# Patient Record
Sex: Male | Born: 1950
Health system: Southern US, Community
[De-identification: ages and names within clinical notes are randomized; demographics above are authoritative.]

## PROBLEM LIST (undated history)

## (undated) DIAGNOSIS — F419 Anxiety disorder, unspecified: Secondary | ICD-10-CM

## (undated) DIAGNOSIS — Z9221 Personal history of antineoplastic chemotherapy: Secondary | ICD-10-CM

## (undated) DIAGNOSIS — D4989 Neoplasm of unspecified behavior of other specified sites: Secondary | ICD-10-CM

## (undated) DIAGNOSIS — G709 Myoneural disorder, unspecified: Secondary | ICD-10-CM

## (undated) DIAGNOSIS — Z923 Personal history of irradiation: Secondary | ICD-10-CM

## (undated) DIAGNOSIS — C9000 Multiple myeloma not having achieved remission: Secondary | ICD-10-CM

## (undated) DIAGNOSIS — Z5189 Encounter for other specified aftercare: Secondary | ICD-10-CM

## (undated) DIAGNOSIS — Z9484 Stem cells transplant status: Secondary | ICD-10-CM

## (undated) HISTORY — PX: COLONOSCOPY: SHX174

## (undated) HISTORY — DX: Personal history of irradiation: Z92.3

## (undated) HISTORY — DX: Myoneural disorder, unspecified: G70.9

## (undated) HISTORY — PX: KNEE ARTHROSCOPY: SUR90

## (undated) HISTORY — DX: Anxiety disorder, unspecified: F41.9

## (undated) HISTORY — PX: APPENDECTOMY: SHX54

## (undated) HISTORY — DX: Personal history of antineoplastic chemotherapy: Z92.21

## (undated) MED FILL — Dexamethasone Sodium Phosphate Inj 100 MG/10ML: INTRAMUSCULAR | Qty: 2 | Status: AC

---
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
## 2008-02-01 ENCOUNTER — Ambulatory Visit: Payer: Self-pay | Admitting: Gastroenterology

## 2008-02-18 ENCOUNTER — Ambulatory Visit: Payer: Self-pay | Admitting: Gastroenterology

## 2008-02-18 ENCOUNTER — Encounter: Payer: Self-pay | Admitting: Gastroenterology

## 2008-02-21 ENCOUNTER — Encounter: Payer: Self-pay | Admitting: Gastroenterology

## 2010-03-05 NOTE — Procedures (Signed)
Summary: Colonoscopy   Colonoscopy  Procedure date:  02/18/2008  Findings:      Location:  [facility].    Procedures Next Due Date:    Colonoscopy: 02/2018  COLONOSCOPY PROCEDURE REPORT  PATIENT:  Alexander, Saunders  MR#:  [id number] BIRTHDATE:   08-17-1950   GENDER:   male  ENDOSCOPIST:   Venita Lick. Russella Dar, MD, [facility] Referred by:   PROCEDURE DATE:  02/18/2008 PROCEDURE:  Colonoscopy with snare polypectomy ASA CLASS:   Class I INDICATIONS: 1) screening   MEDICATIONS:    Fentanyl 50 mcg IV, Versed 7 mg IV  DESCRIPTION OF PROCEDURE:   After the risks benefits and alternatives of the procedure were thoroughly explained, informed consent was obtained.  Digital rectal exam was performed and revealed no abnormalities.   The LB [id number] endoscope was introduced through the anus and advanced to the cecum, which was identified by both the appendix and ileocecal valve, without limitations.  The quality of the prep was good, using MoviPrep.  The instrument was then slowly withdrawn as the colon was fully examined. <<PROCEDUREIMAGES>>          <<OLD IMAGES>>  FINDINGS:  A sessile polyp was found in the sigmoid colon. It was 3 mm in size. Polyp was snared without cautery. Retrieval was successful. This was otherwise a normal examination.   Retroflexed views in the rectum revealed internal hemorrhoids, small.  The time to cecum =  5.75  minutes. The scope was then withdrawn (time =  10.5  min) from the patient and the procedure completed.  COMPLICATIONS:   None  ENDOSCOPIC IMPRESSION:  1) 3 mm sessile polyp in the sigmoid colon  2) Internal hemorrhoids RECOMMENDATIONS:  1) await pathology results  2) If the polyp(s) removed today are proven to be adenomatous (pre-cancerous) polyps, the patient will need a colonoscopy in 5 years. Otherwise they should continue to follow colorectal cancer screening guidelines for "routine risk" patients with colonoscopy in 10  years.   Venita Lick. Russella Dar, MD, [facility]    CC: Catha Gosselin MD     REPORT OF SURGICAL PATHOLOGY   Case #: [id number] Patient Name: Alexander Saunders, Alexander Saunders. Office Chart Number:  [id number]   MRN: [id number] Pathologist: Beulah Gandy. Luisa Hart, MD DOB/Age  02-26-1950 (Age: 60)    Gender: M Date Taken:  02/18/2008 Date Received: 02/18/2008   FINAL DIAGNOSIS   ***MICROSCOPIC EXAMINATION AND DIAGNOSIS***   COLON, SIGMOID POLYP:  POLYPOID COLONIC MUCOSA WITH MELANOSIS COLI.  NO ADENOMATOUS CHANGE OR EVIDENCE OF MALIGNANCY.     kv Date Reported:  02/21/2008     Beulah Gandy. Luisa Hart, MD *** Electronically Signed Out By JDP ***      February 21, 2008 MRN: [id number]    Alexander Saunders [address] [place], [place]  [address]    Dear Mr. Alexander Saunders,  I am pleased to inform you that the biopsies taken during your recent colonoscopy did not show any evidence of cancer upon pathologic examination. The biopsies did not show polyp tissue.   Continue with the treatment plan as outlined on the day of your      exam.  You should have a repeat colonoscopy examination for routine colorectal cancer screening in 10 years.  Please call us if you are having persistent problems or have questions about your condition that have not been fully answered at this time.  Sincerely,  Meryl Dare MD [facility]  This letter has been electronically signed by your physician.   This  report was created from the original endoscopy report, which was reviewed and signed by the above listed endoscopist.

## 2010-03-05 NOTE — Letter (Signed)
Summary: Patient Notice- Colon Biospy Results  [facility]  [address] [place], [place] [address]   Phone: [phone]  Fax: [phone]        February 21, 2008 MRN: [id number]    Alexander Saunders [address] [place], [place]  [address]    Dear [name],  I am pleased to inform you that the biopsies taken during your recent colonoscopy did not show any evidence of cancer upon pathologic examination. The biopsies did not show polyp tissue.   Continue with the treatment plan as outlined on the day of your      exam.  You should have a repeat colonoscopy examination for routine colorectal cancer screening in 10 years.  Please call us if you are having persistent problems or have questions about your condition that have not been fully answered at this time.  Sincerely,  Meryl Dare MD [facility]  This letter has been electronically signed by your physician.

## 2010-03-05 NOTE — Miscellaneous (Signed)
Summary: Previsit  Clinical Lists Changes  Medications: Added new medication of MOVIPREP 100 GM  SOLR (PEG-KCL-NACL-NASULF-NA ASC-C) As per prep instructions. - Signed Rx of MOVIPREP 100 GM  SOLR (PEG-KCL-NACL-NASULF-NA ASC-C) As per prep instructions.;  #1 x 0;  Signed;  Entered by: Jennye Boroughs RN;  Authorized by: Meryl Dare MD [facility]  Method used: Electronically to [facility] #[id number]*, [address], [place], [place]  [address], Ph: [phone], Fax: [phone] Observations: Added new observation of NKA: T (02/01/2008 16:18)    Prescriptions: MOVIPREP 100 GM  SOLR (PEG-KCL-NACL-NASULF-NA ASC-C) As per prep instructions.  #1 x 0   Entered by:   Jennye Boroughs RN   Authorized by:   Meryl Dare MD [facility]   Signed by:   Jennye Boroughs RN on 02/01/2008   Method used:   Electronically to        [id number][facility] (retail)       [address] [place], [place]  [address]       Ph: [phone]       Fax: [phone]   RxID:   [id number]

## 2014-06-02 ENCOUNTER — Encounter: Payer: Self-pay | Admitting: Gastroenterology

## 2016-02-04 DIAGNOSIS — Z5189 Encounter for other specified aftercare: Secondary | ICD-10-CM

## 2016-02-04 DIAGNOSIS — C9000 Multiple myeloma not having achieved remission: Secondary | ICD-10-CM

## 2016-02-04 HISTORY — DX: Encounter for other specified aftercare: Z51.89

## 2016-02-04 HISTORY — DX: Multiple myeloma not having achieved remission: C90.00

## 2016-05-22 DIAGNOSIS — Z23 Encounter for immunization: Secondary | ICD-10-CM | POA: Diagnosis not present

## 2016-05-29 DIAGNOSIS — M545 Low back pain: Secondary | ICD-10-CM | POA: Diagnosis not present

## 2016-05-29 DIAGNOSIS — R079 Chest pain, unspecified: Secondary | ICD-10-CM | POA: Diagnosis not present

## 2016-05-29 DIAGNOSIS — R5382 Chronic fatigue, unspecified: Secondary | ICD-10-CM | POA: Diagnosis not present

## 2016-05-29 DIAGNOSIS — N529 Male erectile dysfunction, unspecified: Secondary | ICD-10-CM | POA: Diagnosis not present

## 2016-05-29 DIAGNOSIS — D649 Anemia, unspecified: Secondary | ICD-10-CM | POA: Diagnosis not present

## 2016-07-31 DIAGNOSIS — D638 Anemia in other chronic diseases classified elsewhere: Secondary | ICD-10-CM | POA: Diagnosis not present

## 2016-07-31 DIAGNOSIS — N529 Male erectile dysfunction, unspecified: Secondary | ICD-10-CM | POA: Diagnosis not present

## 2016-07-31 DIAGNOSIS — M545 Low back pain: Secondary | ICD-10-CM | POA: Diagnosis not present

## 2016-08-26 DIAGNOSIS — D638 Anemia in other chronic diseases classified elsewhere: Secondary | ICD-10-CM | POA: Diagnosis not present

## 2016-08-26 DIAGNOSIS — N529 Male erectile dysfunction, unspecified: Secondary | ICD-10-CM | POA: Diagnosis not present

## 2016-08-26 DIAGNOSIS — R0789 Other chest pain: Secondary | ICD-10-CM | POA: Diagnosis not present

## 2016-08-27 ENCOUNTER — Other Ambulatory Visit: Payer: Self-pay | Admitting: Internal Medicine

## 2016-08-27 DIAGNOSIS — R0789 Other chest pain: Secondary | ICD-10-CM

## 2016-09-02 ENCOUNTER — Observation Stay (HOSPITAL_COMMUNITY): Payer: Medicare Other

## 2016-09-02 ENCOUNTER — Other Ambulatory Visit: Payer: Self-pay

## 2016-09-02 ENCOUNTER — Emergency Department (HOSPITAL_COMMUNITY): Payer: Medicare Other

## 2016-09-02 ENCOUNTER — Inpatient Hospital Stay (HOSPITAL_COMMUNITY)
Admission: EM | Admit: 2016-09-02 | Discharge: 2016-09-06 | DRG: 029 | Disposition: A | Discharge status: Home Health | Payer: Medicare Other | Attending: Internal Medicine | Admitting: Internal Medicine

## 2016-09-02 ENCOUNTER — Encounter (HOSPITAL_COMMUNITY): Payer: Self-pay | Admitting: Emergency Medicine

## 2016-09-02 DIAGNOSIS — G9529 Other cord compression: Secondary | ICD-10-CM | POA: Diagnosis not present

## 2016-09-02 DIAGNOSIS — N179 Acute kidney failure, unspecified: Secondary | ICD-10-CM | POA: Diagnosis not present

## 2016-09-02 DIAGNOSIS — W19XXXA Unspecified fall, initial encounter: Secondary | ICD-10-CM | POA: Diagnosis present

## 2016-09-02 DIAGNOSIS — C799 Secondary malignant neoplasm of unspecified site: Secondary | ICD-10-CM

## 2016-09-02 DIAGNOSIS — M6281 Muscle weakness (generalized): Secondary | ICD-10-CM | POA: Diagnosis not present

## 2016-09-02 DIAGNOSIS — D492 Neoplasm of unspecified behavior of bone, soft tissue, and skin: Secondary | ICD-10-CM

## 2016-09-02 DIAGNOSIS — R269 Unspecified abnormalities of gait and mobility: Secondary | ICD-10-CM | POA: Diagnosis not present

## 2016-09-02 DIAGNOSIS — R29898 Other symptoms and signs involving the musculoskeletal system: Secondary | ICD-10-CM

## 2016-09-02 DIAGNOSIS — R29818 Other symptoms and signs involving the nervous system: Secondary | ICD-10-CM | POA: Diagnosis not present

## 2016-09-02 DIAGNOSIS — R531 Weakness: Secondary | ICD-10-CM | POA: Diagnosis not present

## 2016-09-02 DIAGNOSIS — G9589 Other specified diseases of spinal cord: Secondary | ICD-10-CM | POA: Diagnosis not present

## 2016-09-02 DIAGNOSIS — M5126 Other intervertebral disc displacement, lumbar region: Secondary | ICD-10-CM | POA: Diagnosis not present

## 2016-09-02 DIAGNOSIS — G9520 Unspecified cord compression: Secondary | ICD-10-CM | POA: Diagnosis not present

## 2016-09-02 DIAGNOSIS — G9619 Other disorders of meninges, not elsewhere classified: Secondary | ICD-10-CM | POA: Diagnosis not present

## 2016-09-02 DIAGNOSIS — D72829 Elevated white blood cell count, unspecified: Secondary | ICD-10-CM | POA: Diagnosis not present

## 2016-09-02 DIAGNOSIS — Z8249 Family history of ischemic heart disease and other diseases of the circulatory system: Secondary | ICD-10-CM

## 2016-09-02 DIAGNOSIS — R634 Abnormal weight loss: Secondary | ICD-10-CM | POA: Diagnosis not present

## 2016-09-02 DIAGNOSIS — D62 Acute posthemorrhagic anemia: Secondary | ICD-10-CM | POA: Diagnosis not present

## 2016-09-02 DIAGNOSIS — D638 Anemia in other chronic diseases classified elsewhere: Secondary | ICD-10-CM | POA: Diagnosis not present

## 2016-09-02 DIAGNOSIS — M899 Disorder of bone, unspecified: Secondary | ICD-10-CM | POA: Diagnosis not present

## 2016-09-02 DIAGNOSIS — R339 Retention of urine, unspecified: Secondary | ICD-10-CM | POA: Diagnosis not present

## 2016-09-02 DIAGNOSIS — G988 Other disorders of nervous system: Secondary | ICD-10-CM | POA: Diagnosis not present

## 2016-09-02 DIAGNOSIS — N529 Male erectile dysfunction, unspecified: Secondary | ICD-10-CM | POA: Diagnosis not present

## 2016-09-02 DIAGNOSIS — G959 Disease of spinal cord, unspecified: Secondary | ICD-10-CM | POA: Diagnosis present

## 2016-09-02 DIAGNOSIS — K5909 Other constipation: Secondary | ICD-10-CM | POA: Diagnosis present

## 2016-09-02 DIAGNOSIS — G96198 Other disorders of meninges, not elsewhere classified: Secondary | ICD-10-CM | POA: Diagnosis present

## 2016-09-02 LAB — COMPREHENSIVE METABOLIC PANEL
ALT: 18 U/L (ref 17–63)
AST: 24 U/L (ref 15–41)
Albumin: 4.6 g/dL (ref 3.5–5.0)
Alkaline Phosphatase: 30 U/L — ABNORMAL LOW (ref 38–126)
Anion gap: 9 (ref 5–15)
BUN: 26 mg/dL — ABNORMAL HIGH (ref 6–20)
CO2: 24 mmol/L (ref 22–32)
Calcium: 10.7 mg/dL — ABNORMAL HIGH (ref 8.9–10.3)
Chloride: 107 mmol/L (ref 101–111)
Creatinine, Ser: 1.6 mg/dL — ABNORMAL HIGH (ref 0.61–1.24)
GFR calc Af Amer: 50 mL/min — ABNORMAL LOW (ref >60)
GFR calc non Af Amer: 43 mL/min — ABNORMAL LOW (ref >60)
Glucose, Bld: 101 mg/dL — ABNORMAL HIGH (ref 65–99)
Potassium: 4 mmol/L (ref 3.5–5.1)
Sodium: 140 mmol/L (ref 135–145)
Total Bilirubin: 0.8 mg/dL (ref 0.3–1.2)
Total Protein: 7.5 g/dL (ref 6.5–8.1)

## 2016-09-02 LAB — URINALYSIS, ROUTINE W REFLEX MICROSCOPIC
Bacteria, UA: NONE SEEN
Bilirubin Urine: NEGATIVE
Glucose, UA: NEGATIVE mg/dL
Ketones, ur: NEGATIVE mg/dL
Leukocytes, UA: NEGATIVE
Nitrite: NEGATIVE
Protein, ur: NEGATIVE mg/dL
Specific Gravity, Urine: 1.012 (ref 1.005–1.030)
Squamous Epithelial / LPF: NONE SEEN
pH: 5 (ref 5.0–8.0)

## 2016-09-02 LAB — CBC WITH DIFFERENTIAL/PLATELET
Basophils Absolute: 0 10*3/uL (ref 0.0–0.1)
Basophils Relative: 0 %
Eosinophils Absolute: 0.3 10*3/uL (ref 0.0–0.7)
Eosinophils Relative: 3 %
HCT: 31.7 % — ABNORMAL LOW (ref 39.0–52.0)
Hemoglobin: 11 g/dL — ABNORMAL LOW (ref 13.0–17.0)
Lymphocytes Relative: 19 %
Lymphs Abs: 2 10*3/uL (ref 0.7–4.0)
MCH: 31.2 pg (ref 26.0–34.0)
MCHC: 34.7 g/dL (ref 30.0–36.0)
MCV: 89.8 fL (ref 78.0–100.0)
Monocytes Absolute: 1.2 10*3/uL — ABNORMAL HIGH (ref 0.1–1.0)
Monocytes Relative: 11 %
Neutro Abs: 7.1 10*3/uL (ref 1.7–7.7)
Neutrophils Relative %: 67 %
Platelets: 321 10*3/uL (ref 150–400)
RBC: 3.53 MIL/uL — ABNORMAL LOW (ref 4.22–5.81)
RDW: 13.7 % (ref 11.5–15.5)
WBC: 10.6 10*3/uL — ABNORMAL HIGH (ref 4.0–10.5)

## 2016-09-02 MED ORDER — LORATADINE 10 MG PO TABS: 10.0000 mg | ORAL_TABLET | Freq: Every day | ORAL | Status: DC | PRN

## 2016-09-02 MED ORDER — ONDANSETRON HCL 4 MG/2ML IJ SOLN: 4.0000 mg | Freq: Four times a day (QID) | INTRAMUSCULAR | Status: DC | PRN

## 2016-09-02 MED ORDER — DIAZEPAM 5 MG PO TABS
5.0000 mg | ORAL_TABLET | Freq: Once | ORAL | Status: AC
  Administered 2016-09-02: 5 mg via ORAL
  Filled 2016-09-02: qty 1

## 2016-09-02 MED ORDER — ENOXAPARIN SODIUM 40 MG/0.4ML ~~LOC~~ SOLN
40.0000 mg | SUBCUTANEOUS | Status: DC
  Administered 2016-09-03: 40 mg via SUBCUTANEOUS
  Filled 2016-09-02: qty 0.4

## 2016-09-02 MED ORDER — SODIUM CHLORIDE 0.9% FLUSH
3.0000 mL | Freq: Two times a day (BID) | INTRAVENOUS | Status: DC
  Administered 2016-09-03 – 2016-09-05 (×3): 3 mL via INTRAVENOUS

## 2016-09-02 MED ORDER — DEXAMETHASONE SODIUM PHOSPHATE 4 MG/ML IJ SOLN
4.0000 mg | Freq: Four times a day (QID) | INTRAMUSCULAR | Status: DC
  Administered 2016-09-03 – 2016-09-06 (×14): 4 mg via INTRAVENOUS
  Filled 2016-09-02 (×17): qty 1

## 2016-09-02 MED ORDER — ONDANSETRON HCL 4 MG PO TABS: 4.0000 mg | ORAL_TABLET | Freq: Four times a day (QID) | ORAL | Status: DC | PRN

## 2016-09-02 MED ORDER — GADOBENATE DIMEGLUMINE 529 MG/ML IV SOLN
18.0000 mL | Freq: Once | INTRAVENOUS | Status: AC | PRN
  Administered 2016-09-02: 18 mL via INTRAVENOUS

## 2016-09-02 MED ORDER — DEXAMETHASONE SODIUM PHOSPHATE 10 MG/ML IJ SOLN
10.0000 mg | Freq: Once | INTRAMUSCULAR | Status: AC
  Administered 2016-09-02: 10 mg via INTRAVENOUS
  Filled 2016-09-02: qty 1

## 2016-09-02 MED ORDER — ACETAMINOPHEN 325 MG PO TABS: 650.0000 mg | ORAL_TABLET | Freq: Four times a day (QID) | ORAL | Status: DC | PRN

## 2016-09-02 MED ORDER — ALBUTEROL SULFATE (2.5 MG/3ML) 0.083% IN NEBU: 2.5000 mg | INHALATION_SOLUTION | RESPIRATORY_TRACT | Status: DC | PRN

## 2016-09-02 MED ORDER — LORAZEPAM 2 MG/ML IJ SOLN
1.0000 mg | Freq: Once | INTRAMUSCULAR | Status: AC
  Administered 2016-09-02: 1 mg via INTRAVENOUS
  Filled 2016-09-02: qty 1

## 2016-09-02 MED ORDER — HYDROCODONE-ACETAMINOPHEN 5-325 MG PO TABS
1.0000 | ORAL_TABLET | ORAL | Status: DC | PRN
  Administered 2016-09-02 – 2016-09-05 (×6): 1 via ORAL
  Filled 2016-09-02 (×6): qty 1

## 2016-09-02 MED ORDER — ACETAMINOPHEN 650 MG RE SUPP: 650.0000 mg | Freq: Four times a day (QID) | RECTAL | Status: DC | PRN

## 2016-09-02 MED ORDER — SODIUM CHLORIDE 0.9 % IV SOLN
INTRAVENOUS | Status: DC
  Administered 2016-09-02 – 2016-09-03 (×3): via INTRAVENOUS

## 2016-09-02 NOTE — ED Notes (Signed)
Pt bladder scanned post void, 729ml's noted in bladder

## 2016-09-02 NOTE — ED Notes (Signed)
ED Provider at bedside. 

## 2016-09-02 NOTE — H&P (Signed)
History and Physical    Alexander Saunders VQQ:[id number] DOB: 1950/10/09 DOA: 09/02/2016  Referring MD/NP/PA: Dr. Rogene Houston PCP: Benito Mccreedy, MD  Patient coming from: home  Chief Complaint: Leg weakness  HPI: Alexander Saunders is a 66 y.o. male without significant past medical history; presents with complaints of lower extremity weakness. For the last 8 weeks patient has complained of pain on his left mid-lower back that would radiate to the side of his chest /pectoral muscle. Pain continued intermittently become sharp as he notes with coughing. He is also reported progressively worsening weakness on the right leg worse than the left and reports numbness and tingling sensation. The patient is very active at baseline and works out on a daily basis. He also reports weight loss of approximately 8 pounds. He has been taking ibuprofen 800 mg since the onset of symptoms with only mild relief. It seems he initially attributed pain to muscle strain.His wife reports that he went hiking a few weeks ago but he does report being bitten by any ticks. However, last night patient had gotten up to use the bathroom and had fallen due to the right leg weakness. Since that time he also reports that he has not been able to urinate and has had some constipation. Denies any headache, fever, shortness of breath, nausea, vomiting, diarrhea, incontinence, or tobacco abuse. He occasionally drinks, but not on a regular basis.  ED Course: Upon admission into the emergency department patient was found to be afebrile with vital signs relatively within normal limits. Labs revealed WBC 10.6, hemoglobin 11, BUN 26, creatinine 1.6, and calcium 10.7. Foley catheter placed for urinary retention with at least 439m of urine out. Neurosurgery was consulted and saw the patient  Review of Systems: Review of Systems  Constitutional: Positive for weight loss. Negative for chills and fever.  HENT: Positive for hearing loss. Negative for  ear pain.   Eyes: Negative for double vision and photophobia.  Respiratory: Negative for cough, sputum production and shortness of breath.   Cardiovascular: Negative for palpitations and claudication.  Gastrointestinal: Positive for constipation.  Genitourinary: Negative for frequency.       Positive for Urinary hesitancy  Musculoskeletal: Positive for back pain and falls.  Skin: Negative for itching and rash.  Neurological: Positive for sensory change and focal weakness.  Endo/Heme/Allergies: Positive for environmental allergies. Negative for polydipsia.  Psychiatric/Behavioral: Negative for substance abuse. The patient has insomnia.     History reviewed. No pertinent past medical history.  Past Surgical History:  Procedure Laterality Date  . APPENDECTOMY    . KNEE ARTHROSCOPY     r/knee     reports that he has never smoked. He has never used smokeless tobacco. He reports that he drinks alcohol. His drug history is not on file.  No Known Allergies  Family History  Problem Relation Age of Onset  . Hypertension Mother   . Cancer Mother   . Hypertension Father     Prior to Admission medications   Medication Sig Start Date End Date Taking? Authorizing Provider  loratadine (CLARITIN) 10 MG tablet Take 10 mg by mouth daily as needed for allergies.   Yes [provider]  ibuprofen (ADVIL,MOTRIN) 600 MG tablet Take 600 mg by mouth every 6 (six) hours as needed for pain. 08/26/16   [provider]    Physical Exam:  Constitutional: NAD, calm, comfortable Vitals:   09/02/16 1033 09/02/16 1259 09/02/16 1530 09/02/16 1534  BP:  123/73 124/72 124/72  Pulse:  75 72 72  Resp:  '16 16 16  '$ Temp:    97.6 F (36.4 C)  TempSrc:      SpO2:  99% 99% 99%  Weight: 87.1 kg (192 lb)      Eyes: PERRL, lids and conjunctivae normal ENMT: Mucous membranes are moist. Posterior pharynx clear of any exudate or lesions.Normal dentition.  Neck: normal, supple, no masses, no  thyromegaly Respiratory: clear to auscultation bilaterally, no wheezing, no crackles. Normal respiratory effort. No accessory muscle use.  Cardiovascular: Regular rate and rhythm, no murmurs / rubs / gallops. No extremity edema. 2+ pedal pulses. No carotid bruits.  Abdomen: no tenderness, no masses palpated. No hepatosplenomegaly. Bowel sounds positive.  Musculoskeletal: no clubbing / cyanosis. No joint deformity upper and lower extremities. Good ROM, no contractures. Normal muscle tone.  Skin: no rashes, lesions, ulcers. No induration Neurologic: CN 2-12 grossly intact. Sensation intact, DTR normal. Right lower extremity strength 3/5 and left lower extremity 4/5. Psychiatric: Normal judgment and insight. Alert and oriented x 3. Normal mood.     Labs on Admission: I have personally reviewed following labs and imaging studies  CBC:  Recent Labs Lab 09/02/16 1208  WBC 10.6*  NEUTROABS 7.1  HGB 11.0*  HCT 31.7*  MCV 89.8  PLT 182   Basic Metabolic Panel:  Recent Labs Lab 09/02/16 1208  NA 140  K 4.0  CL 107  CO2 24  GLUCOSE 101*  BUN 26*  CREATININE 1.60*  CALCIUM 10.7*   GFR: CrCl cannot be calculated (Unknown ideal weight.). Liver Function Tests:  Recent Labs Lab 09/02/16 1208  AST 24  ALT 18  ALKPHOS 30*  BILITOT 0.8  PROT 7.5  ALBUMIN 4.6   No results for input(s): LIPASE, AMYLASE in the last 168 hours. No results for input(s): AMMONIA in the last 168 hours. Coagulation Profile: No results for input(s): INR, PROTIME in the last 168 hours. Cardiac Enzymes: No results for input(s): CKTOTAL, CKMB, CKMBINDEX, TROPONINI in the last 168 hours. BNP (last 3 results) No results for input(s): PROBNP in the last 8760 hours. HbA1C: No results for input(s): HGBA1C in the last 72 hours. CBG: No results for input(s): GLUCAP in the last 168 hours. Lipid Profile: No results for input(s): CHOL, HDL, LDLCALC, TRIG, CHOLHDL, LDLDIRECT in the last 72 hours. Thyroid  Function Tests: No results for input(s): TSH, T4TOTAL, FREET4, T3FREE, THYROIDAB in the last 72 hours. Anemia Panel: No results for input(s): VITAMINB12, FOLATE, FERRITIN, TIBC, IRON, RETICCTPCT in the last 72 hours. Urine analysis:    Component Value Date/Time   COLORURINE YELLOW 09/02/2016 1254   APPEARANCEUR CLEAR 09/02/2016 1254   LABSPEC 1.012 09/02/2016 1254   PHURINE 5.0 09/02/2016 1254   GLUCOSEU NEGATIVE 09/02/2016 1254   HGBUR SMALL (A) 09/02/2016 1254   BILIRUBINUR NEGATIVE 09/02/2016 1254   KETONESUR NEGATIVE 09/02/2016 1254   PROTEINUR NEGATIVE 09/02/2016 1254   NITRITE NEGATIVE 09/02/2016 1254   LEUKOCYTESUR NEGATIVE 09/02/2016 1254   Sepsis Labs: No results found for this or any previous visit (from the past 240 hour(s)).   Radiological Exams on Admission: Mr Thoracic Spine W Wo Contrast  Result Date: 09/02/2016 CLINICAL DATA:  Right leg weakness.  Difficulty urinating. EXAM: MRI THORACIC AND LUMBAR SPINE WITHOUT AND WITH CONTRAST TECHNIQUE: Multiplanar and multiecho pulse sequences of the thoracic and lumbar spine were obtained without and with intravenous contrast. CONTRAST:  73m MULTIHANCE GADOBENATE DIMEGLUMINE 529 MG/ML IV SOLN COMPARISON:  None. FINDINGS: MRI THORACIC SPINE FINDINGS Alignment:  Physiologic. Vertebrae:  There is diffuse signal abnormality throughout the thoracic vertebral column bone marrow with innumerable low T1 weighted signal lesions that show enhancement following contrast administration. There is no compression fracture. There is an infiltrative T5 lesion occupying the left aspect of the vertebral body and extending into the posterior elements with a large dorsal epidural component that extends from T4-T6 and measures 1.0 x 1.4 x 6.6 cm (AP x Transverse x CC). This mass anteriorly displaces and compresses the spinal cord. Cord: There is no visible cord signal change. No syrinx or abnormal spinal cord enhancement. Paraspinal and other soft tissues:  There is abnormal enhancement of the interspinous soft tissues at the T4-T6 levels. Disc levels: As above, the dorsal epidural mass at the T4-T6 levels effaces the thecal sac and compresses the spinal cord. Otherwise, there is no thoracic spinal canal stenosis. There is mild extension of the mass into the bilateral T5-T6 neural foramina, but the foramina remain patent with mild-to-moderate stenosis. MRI LUMBAR SPINE FINDINGS Segmentation:  Standard Alignment:  Normal Vertebrae: Diffusely abnormal bone marrow signal. No compression fracture. Conus medullaris: Extends to the L1 level and appears normal. No lumbar epidural disease. No compression of the cauda equina nerve roots. Paraspinal and other soft tissues: The visualized aorta, IVC and iliac vessels are normal. The visualized retroperitoneal organs and paraspinal soft tissues are normal. Disc levels: T12-L1: Normal disc space and facets. No spinal canal or neuroforaminal stenosis. L1-L2: Small central disc protrusion without spinal canal or neural foraminal stenosis. L2-L3: Minimal disc bulge without stenosis. L3-L4: Mild disc bulge without stenosis. L4-L5: Mild bilateral facet hypertrophy with disc desiccation and small bulge. No spinal canal stenosis. Mild bilateral neural foraminal stenosis. L5-S1: Normal disc space and facets. No spinal canal or neuroforaminal stenosis. Visualized sacrum: Diffuse sacral bone marrow abnormality, as throughout the thoracic and lumbar spine. IMPRESSION: 1. Dorsal epidural mass at the T4-T6 level resulting in compression of the thoracic spinal cord. 2. Diffusely abnormal bone marrow signal and contrast enhancement, consistent with a marrow replacement process such as multiple myeloma versus diffuse osseous metastatic disease. 3. No pathologic compression fracture. Critical Value/emergent results were called by telephone at the time of interpretation on 09/02/2016 at 8:34 pm to Dr. Vanetta Mulders, who verbally acknowledged these  results. Electronically Signed   By: Deatra Robinson M.D.   On: 09/02/2016 20:46   Mr Lumbar Spine W Wo Contrast  Result Date: 09/02/2016 CLINICAL DATA:  Right leg weakness.  Difficulty urinating. EXAM: MRI THORACIC AND LUMBAR SPINE WITHOUT AND WITH CONTRAST TECHNIQUE: Multiplanar and multiecho pulse sequences of the thoracic and lumbar spine were obtained without and with intravenous contrast. CONTRAST:  1mL MULTIHANCE GADOBENATE DIMEGLUMINE 529 MG/ML IV SOLN COMPARISON:  None. FINDINGS: MRI THORACIC SPINE FINDINGS Alignment:  Physiologic. Vertebrae: There is diffuse signal abnormality throughout the thoracic vertebral column bone marrow with innumerable low T1 weighted signal lesions that show enhancement following contrast administration. There is no compression fracture. There is an infiltrative T5 lesion occupying the left aspect of the vertebral body and extending into the posterior elements with a large dorsal epidural component that extends from T4-T6 and measures 1.0 x 1.4 x 6.6 cm (AP x Transverse x CC). This mass anteriorly displaces and compresses the spinal cord. Cord: There is no visible cord signal change. No syrinx or abnormal spinal cord enhancement. Paraspinal and other soft tissues: There is abnormal enhancement of the interspinous soft tissues at the T4-T6 levels. Disc levels: As above, the dorsal epidural mass at the T4-T6  levels effaces the thecal sac and compresses the spinal cord. Otherwise, there is no thoracic spinal canal stenosis. There is mild extension of the mass into the bilateral T5-T6 neural foramina, but the foramina remain patent with mild-to-moderate stenosis. MRI LUMBAR SPINE FINDINGS Segmentation:  Standard Alignment:  Normal Vertebrae: Diffusely abnormal bone marrow signal. No compression fracture. Conus medullaris: Extends to the L1 level and appears normal. No lumbar epidural disease. No compression of the cauda equina nerve roots. Paraspinal and other soft tissues: The  visualized aorta, IVC and iliac vessels are normal. The visualized retroperitoneal organs and paraspinal soft tissues are normal. Disc levels: T12-L1: Normal disc space and facets. No spinal canal or neuroforaminal stenosis. L1-L2: Small central disc protrusion without spinal canal or neural foraminal stenosis. L2-L3: Minimal disc bulge without stenosis. L3-L4: Mild disc bulge without stenosis. L4-L5: Mild bilateral facet hypertrophy with disc desiccation and small bulge. No spinal canal stenosis. Mild bilateral neural foraminal stenosis. L5-S1: Normal disc space and facets. No spinal canal or neuroforaminal stenosis. Visualized sacrum: Diffuse sacral bone marrow abnormality, as throughout the thoracic and lumbar spine. IMPRESSION: 1. Dorsal epidural mass at the T4-T6 level resulting in compression of the thoracic spinal cord. 2. Diffusely abnormal bone marrow signal and contrast enhancement, consistent with a marrow replacement process such as multiple myeloma versus diffuse osseous metastatic disease. 3. No pathologic compression fracture. Critical Value/emergent results were called by telephone at the time of interpretation on 09/02/2016 at 8:34 pm to Dr. Fredia Sorrow, who verbally acknowledged these results. Electronically Signed   By: Ulyses Jarred M.D.   On: 09/02/2016 20:46    MRI: Independently reviewed. Visualization of the epidural mass seen on the thoracic spine.  Assessment/Plan Lower extremity weakness 2/2 Epidural Mass with cord compression: Acute. Patient presents with a week history of progressively worsening back pain and lower extremity weakness. MRI reveals epidural mass with signs of metastatic cancer likely causing symptoms. Patient's last colonoscopy in 2010. - Admit to a medsurg bed - Will need to call PCP also is in a.m. to obtain last PSA results preformed in 07/2016 - Continue IV steroids - Hydrocodone prn pain - Check TSH, ESR, CRP, SPEP - Will needs further metastatic  workup - Physical therapy to eval and treat - Appreciate neurosurgery consultative services will follow-up for further recommendations  Acute kidney injury with urinary retention: Baseline creatinine unknown at this time but presents with creatinine 1.6 and BUN 26. Foley catheter placed with 454m urine out. Urinalysis otherwise negative. Suspect acute kidney injury likely related to recent use of NSAIDs along with urinary retention. - Continue Foley catheter - IV fluids of NS at 75 ml/hr - Check urine FeNa - Check renal ultrasound - Discontinue nephrotoxic agents including ibuprofen  Leukocytosis: WBC 10.6 on admission. Suspect likely reactive to above. Urinalysis negative for any signs of infection. - Check CBC in a.m.  Normocytic normochromic anemia: Hemoglobin 11 on admission with no previous baseline available - Recheck CBC in a.m.  Constipation - senna   Hypercalcemia: Acute. Initial calcium 10.7. - IV fluids as seen above - Check ionized calcium in a.m.  DVT prophylaxis: lovenox Code Status: Full Family Communication: Discussed plan of care with the patient and family present at bedside Disposition Plan: TBD  Consults called: Neurosurgery  Admission status: Inpatient  RNorval MortonMD [facility] Pager [phone]  If 7PM-7AM, please contact night-coverage [email or web address] Password [email or web address]  09/02/2016, 9:01 PM

## 2016-09-02 NOTE — Progress Notes (Signed)
Received report from Ronalee Belts, [place] in the ED.

## 2016-09-02 NOTE — ED Notes (Signed)
MRI called to inquire about when pt would be going down. Writer was informed that MRI is closing at 3 and pt may not get in today. MD made aware

## 2016-09-02 NOTE — ED Triage Notes (Signed)
Pt reports a 5 day hx of weaknesses and tingling in both legs. Weakness more pronounce in r/leg. Pt noted increased weakness yesterday. Seen by PCP today. Recommended an MRI. Pt able to transfer self from wheelchair to stretcher.Transported to ED by his wife

## 2016-09-02 NOTE — ED Notes (Signed)
Pt attempting to void, with no success

## 2016-09-02 NOTE — Consult Note (Signed)
Chief Complaint   Chief Complaint  Patient presents with  . Weakness    HPI   HPI: Alexander Saunders is a 66 y.o. male who initially presented to [facility] ER due to progressive right lower extremity weakness. Transferred to [facility] ER for MRI. Weakness started abruptly Sunday but gradual improved until yesterday. Weakness has been progressively worsening, RLE > LLE. He has had one fall due to the weakness. Has had thoracic back pain that radiates to his chest and down his lower back for the past two months. Dx with serratus anterior strain. Has been working with PT without relief and taking ibuprofen. Does feel as though he cant urinate when he feels the urge. No loss of bowels.  Healthy otherwise. No weight loss, night sweats. Sees PCP regularly. Last physical May 2018 - normal.  There are no active problems to display for this patient.   PMH: History reviewed. No pertinent past medical history.  PSH: Past Surgical History:  Procedure Laterality Date  . APPENDECTOMY    . KNEE ARTHROSCOPY     r/knee     (Not in a hospital admission)  SH: Social History  Substance Use Topics  . Smoking status: Never Smoker  . Smokeless tobacco: Never Used  . Alcohol use Yes     Comment: occ    MEDS: Prior to Admission medications   Medication Sig Start Date End Date Taking? Authorizing Provider  loratadine (CLARITIN) 10 MG tablet Take 10 mg by mouth daily as needed for allergies.   Yes [provider]  ibuprofen (ADVIL,MOTRIN) 600 MG tablet Take 600 mg by mouth every 6 (six) hours as needed for pain. 08/26/16   [provider]    ALLERGY: No Known Allergies  Social History  Substance Use Topics  . Smoking status: Never Smoker  . Smokeless tobacco: Never Used  . Alcohol use Yes     Comment: occ     Family History  Problem Relation Age of Onset  . Hypertension Mother   . Cancer Mother   . Hypertension Father      ROS   Review of Systems  Constitutional: Negative for  fever, malaise/fatigue and weight loss.  Eyes: Negative.   Respiratory: Negative for cough.   Gastrointestinal: Negative.   Musculoskeletal: Positive for back pain and falls.  Neurological: Positive for tingling, sensory change (right lower extremity) and focal weakness (right lower extremity). Negative for dizziness, tremors, speech change, seizures, loss of consciousness and headaches.    Exam   Vitals:   09/02/16 1530 09/02/16 1534  BP: 124/72 124/72  Pulse: 72 72  Resp: 16 16  Temp:  97.6 F (36.4 C)   General appearance: WDWN, NAD Eyes: PERRL Musculoskeletal:     Muscle tone upper extremities: Normal    Muscle tone lower extremities: Normal    Motor exam: Upper Extremities Deltoid Bicep Tricep Grip  Right 5/5 5/5 5/5 5/5  Left 5/5 5/5 5/5 5/5   Lower Extremity IP Quad PF DF EHL  Right 4/5 4/5 4/5 4/5 4/5  Left 5/5 5/5 5/5 5/5 5/5   Neurological Awake, alert, oriented Memory and concentration grossly intact Speech fluent, appropriate CNII: Visual fields normal CNIII/IV/VI: EOMI CNV: Facial sensation normal CNVII: Symmetric, normal strength CNVIII: Grossly normal CNIX: Normal palate movement CNXI: Trap and SCM strength normal CN XII: Tongue protrusion normal Sensation grossly intact to LT  Results - Imaging/Labs   Results for orders placed or performed during the hospital encounter of 09/02/16 (from the   past 48 hour(s))  CBC with Differential     Status: Abnormal   Collection Time: 09/02/16 12:08 PM  Result Value Ref Range   WBC 10.6 (H) 4.0 - 10.5 K/uL   RBC 3.53 (L) 4.22 - 5.81 MIL/uL   Hemoglobin 11.0 (L) 13.0 - 17.0 g/dL   HCT 31.7 (L) 39.0 - 52.0 %   MCV 89.8 78.0 - 100.0 fL   MCH 31.2 26.0 - 34.0 pg   MCHC 34.7 30.0 - 36.0 g/dL   RDW 13.7 11.5 - 15.5 %   Platelets 321 150 - 400 K/uL   Neutrophils Relative % 67 %   Neutro Abs 7.1 1.7 - 7.7 K/uL   Lymphocytes Relative 19 %   Lymphs Abs 2.0 0.7 - 4.0 K/uL   Monocytes Relative 11 %   Monocytes  Absolute 1.2 (H) 0.1 - 1.0 K/uL   Eosinophils Relative 3 %   Eosinophils Absolute 0.3 0.0 - 0.7 K/uL   Basophils Relative 0 %   Basophils Absolute 0.0 0.0 - 0.1 K/uL  Comprehensive metabolic panel     Status: Abnormal   Collection Time: 09/02/16 12:08 PM  Result Value Ref Range   Sodium 140 135 - 145 mmol/L   Potassium 4.0 3.5 - 5.1 mmol/L   Chloride 107 101 - 111 mmol/L   CO2 24 22 - 32 mmol/L   Glucose, Bld 101 (H) 65 - 99 mg/dL   BUN 26 (H) 6 - 20 mg/dL   Creatinine, Ser 1.60 (H) 0.61 - 1.24 mg/dL   Calcium 10.7 (H) 8.9 - 10.3 mg/dL   Total Protein 7.5 6.5 - 8.1 g/dL   Albumin 4.6 3.5 - 5.0 g/dL   AST 24 15 - 41 U/L   ALT 18 17 - 63 U/L   Alkaline Phosphatase 30 (L) 38 - 126 U/L   Total Bilirubin 0.8 0.3 - 1.2 mg/dL   GFR calc non Af Amer 43 (L) >60 mL/min   GFR calc Af Amer 50 (L) >60 mL/min    Comment: (NOTE) The eGFR has been calculated using the CKD EPI equation. This calculation has not been validated in all clinical situations. eGFR's persistently <60 mL/min signify possible Chronic Kidney Disease.    Anion gap 9 5 - 15  Urinalysis, Routine w reflex microscopic     Status: Abnormal   Collection Time: 09/02/16 12:54 PM  Result Value Ref Range   Color, Urine YELLOW YELLOW   APPearance CLEAR CLEAR   Specific Gravity, Urine 1.012 1.005 - 1.030   pH 5.0 5.0 - 8.0   Glucose, UA NEGATIVE NEGATIVE mg/dL   Hgb urine dipstick SMALL (A) NEGATIVE   Bilirubin Urine NEGATIVE NEGATIVE   Ketones, ur NEGATIVE NEGATIVE mg/dL   Protein, ur NEGATIVE NEGATIVE mg/dL   Nitrite NEGATIVE NEGATIVE   Leukocytes, UA NEGATIVE NEGATIVE   RBC / HPF 0-5 0 - 5 RBC/hpf   WBC, UA 0-5 0 - 5 WBC/hpf   Bacteria, UA NONE SEEN NONE SEEN   Squamous Epithelial / LPF NONE SEEN NONE SEEN    Mr Thoracic Spine W Wo Contrast  Result Date: 09/02/2016 CLINICAL DATA:  Right leg weakness.  Difficulty urinating. EXAM: MRI THORACIC AND LUMBAR SPINE WITHOUT AND WITH CONTRAST TECHNIQUE: Multiplanar and  multiecho pulse sequences of the thoracic and lumbar spine were obtained without and with intravenous contrast. CONTRAST:  59m MULTIHANCE GADOBENATE DIMEGLUMINE 529 MG/ML IV SOLN COMPARISON:  None. FINDINGS: MRI THORACIC SPINE FINDINGS Alignment:  Physiologic. Vertebrae: There is diffuse signal abnormality  throughout the thoracic vertebral column bone marrow with innumerable low T1 weighted signal lesions that show enhancement following contrast administration. There is no compression fracture. There is an infiltrative T5 lesion occupying the left aspect of the vertebral body and extending into the posterior elements with a large dorsal epidural component that extends from T4-T6 and measures 1.0 x 1.4 x 6.6 cm (AP x Transverse x CC). This mass anteriorly displaces and compresses the spinal cord. Cord: There is no visible cord signal change. No syrinx or abnormal spinal cord enhancement. Paraspinal and other soft tissues: There is abnormal enhancement of the interspinous soft tissues at the T4-T6 levels. Disc levels: As above, the dorsal epidural mass at the T4-T6 levels effaces the thecal sac and compresses the spinal cord. Otherwise, there is no thoracic spinal canal stenosis. There is mild extension of the mass into the bilateral T5-T6 neural foramina, but the foramina remain patent with mild-to-moderate stenosis. MRI LUMBAR SPINE FINDINGS Segmentation:  Standard Alignment:  Normal Vertebrae: Diffusely abnormal bone marrow signal. No compression fracture. Conus medullaris: Extends to the L1 level and appears normal. No lumbar epidural disease. No compression of the cauda equina nerve roots. Paraspinal and other soft tissues: The visualized aorta, IVC and iliac vessels are normal. The visualized retroperitoneal organs and paraspinal soft tissues are normal. Disc levels: T12-L1: Normal disc space and facets. No spinal canal or neuroforaminal stenosis. L1-L2: Small central disc protrusion without spinal canal or  neural foraminal stenosis. L2-L3: Minimal disc bulge without stenosis. L3-L4: Mild disc bulge without stenosis. L4-L5: Mild bilateral facet hypertrophy with disc desiccation and small bulge. No spinal canal stenosis. Mild bilateral neural foraminal stenosis. L5-S1: Normal disc space and facets. No spinal canal or neuroforaminal stenosis. Visualized sacrum: Diffuse sacral bone marrow abnormality, as throughout the thoracic and lumbar spine. IMPRESSION: 1. Dorsal epidural mass at the T4-T6 level resulting in compression of the thoracic spinal cord. 2. Diffusely abnormal bone marrow signal and contrast enhancement, consistent with a marrow replacement process such as multiple myeloma versus diffuse osseous metastatic disease. 3. No pathologic compression fracture. Critical Value/emergent results were called by telephone at the time of interpretation on 09/02/2016 at 8:34 pm to Dr. SCOTT ZACKOWSKI, who verbally acknowledged these results. Electronically Signed   By: Kevin  Herman M.D.   On: 09/02/2016 20:46   Mr Lumbar Spine W Wo Contrast  Result Date: 09/02/2016 CLINICAL DATA:  Right leg weakness.  Difficulty urinating. EXAM: MRI THORACIC AND LUMBAR SPINE WITHOUT AND WITH CONTRAST TECHNIQUE: Multiplanar and multiecho pulse sequences of the thoracic and lumbar spine were obtained without and with intravenous contrast. CONTRAST:  18mL MULTIHANCE GADOBENATE DIMEGLUMINE 529 MG/ML IV SOLN COMPARISON:  None. FINDINGS: MRI THORACIC SPINE FINDINGS Alignment:  Physiologic. Vertebrae: There is diffuse signal abnormality throughout the thoracic vertebral column bone marrow with innumerable low T1 weighted signal lesions that show enhancement following contrast administration. There is no compression fracture. There is an infiltrative T5 lesion occupying the left aspect of the vertebral body and extending into the posterior elements with a large dorsal epidural component that extends from T4-T6 and measures 1.0 x 1.4 x 6.6 cm  (AP x Transverse x CC). This mass anteriorly displaces and compresses the spinal cord. Cord: There is no visible cord signal change. No syrinx or abnormal spinal cord enhancement. Paraspinal and other soft tissues: There is abnormal enhancement of the interspinous soft tissues at the T4-T6 levels. Disc levels: As above, the dorsal epidural mass at the T4-T6 levels effaces the thecal sac   and compresses the spinal cord. Otherwise, there is no thoracic spinal canal stenosis. There is mild extension of the mass into the bilateral T5-T6 neural foramina, but the foramina remain patent with mild-to-moderate stenosis. MRI LUMBAR SPINE FINDINGS Segmentation:  Standard Alignment:  Normal Vertebrae: Diffusely abnormal bone marrow signal. No compression fracture. Conus medullaris: Extends to the L1 level and appears normal. No lumbar epidural disease. No compression of the cauda equina nerve roots. Paraspinal and other soft tissues: The visualized aorta, IVC and iliac vessels are normal. The visualized retroperitoneal organs and paraspinal soft tissues are normal. Disc levels: T12-L1: Normal disc space and facets. No spinal canal or neuroforaminal stenosis. L1-L2: Small central disc protrusion without spinal canal or neural foraminal stenosis. L2-L3: Minimal disc bulge without stenosis. L3-L4: Mild disc bulge without stenosis. L4-L5: Mild bilateral facet hypertrophy with disc desiccation and small bulge. No spinal canal stenosis. Mild bilateral neural foraminal stenosis. L5-S1: Normal disc space and facets. No spinal canal or neuroforaminal stenosis. Visualized sacrum: Diffuse sacral bone marrow abnormality, as throughout the thoracic and lumbar spine. IMPRESSION: 1. Dorsal epidural mass at the T4-T6 level resulting in compression of the thoracic spinal cord. 2. Diffusely abnormal bone marrow signal and contrast enhancement, consistent with a marrow replacement process such as multiple myeloma versus diffuse osseous metastatic  disease. 3. No pathologic compression fracture. Critical Value/emergent results were called by telephone at the time of interpretation on 09/02/2016 at 8:34 pm to Dr. SCOTT ZACKOWSKI, who verbally acknowledged these results. Electronically Signed   By: Kevin  Herman M.D.   On: 09/02/2016 20:46    Impression/Plan   66 y.o. male with large dorsal epidural mass at T4-6 with compression of spinal cord. There is some evidence of abnormal bone marrow signal concerning for met disease or multiple myeloma. I have discussed the case with Dr Ditty who has also reviewed the imaging. No emergent NS intervention. He will need to be admitted under hospitalist service for metastatic work up. Keep Foley. Pain control. Further treatment will be determined after work up is completed.  - started decadron 

## 2016-09-02 NOTE — ED Notes (Addendum)
Eilene Ghazi, Charge, RN informed this RN that this patient would be coming from [facility] and to wait in the waiting room. When patient finally checked in, this RN informed patient and his wife that due to high census and acuity there are not available rooms at this time and the patient would need to wait in the waiting room but that I would call MRI to see how long it would be until they can transport him to MRI. Charge, RN aware. This RN called MRI and was told possibly 45-1 hour. Pt and family updated. Pts wife approached nurse first desk about 15-20 mins later and stated 'We should not be waiting. We are paying for an observation room and our EMTALA paperwork states 'available room'." This RN apologized to patient and explained the delay and reminded her that I had called MRI and that the patient's MRI would not be delayed.this RN told pts wife I would be happy to call the charge nurse to see about a hallway bed. This RN called charge Janett Billow) and was given hallway bed in pod A and explained to her that a bed would need to be cleaned and the patients name would be called when ready. Janett Billow, charge RN and Wells Guiles, [place] made aware of patients concerns about her EMTALA and wait.

## 2016-09-02 NOTE — ED Provider Notes (Signed)
[facility] DEPT Provider Note   CSN: [id number] Arrival date & time: 09/02/16  1026     History   Chief Complaint Chief Complaint  Patient presents with  . Weakness    HPI Alexander Saunders is a 66 y.o. male.  HPI   Presents with concern for weakness in right leg, started Saturday, improved then came back again yesterday and has been progressively getting worse.  Fell last night due to weakness in right leg. Tingling in both feet, worse in right before now is worse in left.  No other numbness or tingling, maybe some left lower abdomen.  Feels bloated. Had a coughing episode of Friday, and had pain radiating down the left leg, then that improved.  Has had back pain in upper back and left scapula, radiates down to lower back and pectoral x2 months.  Seeing PT for that.  No lower back pain, did a few months ago but none recently.  Not able to urinate but feels like needs to go all the time.  No loss of control of bowels. Has been constipated, taking ibuprofen.  No dysuria.    History reviewed. No pertinent past medical history.  There are no active problems to display for this patient.   Past Surgical History:  Procedure Laterality Date  . APPENDECTOMY    . KNEE ARTHROSCOPY     r/knee       Home Medications    Prior to Admission medications   Medication Sig Start Date End Date Taking? Authorizing Provider  loratadine (CLARITIN) 10 MG tablet Take 10 mg by mouth daily as needed for allergies.   Yes [provider]  ibuprofen (ADVIL,MOTRIN) 600 MG tablet Take 600 mg by mouth every 6 (six) hours as needed for pain. 08/26/16   [provider]    Family History Family History  Problem Relation Age of Onset  . Hypertension Mother   . Cancer Mother   . Hypertension Father     Social History Social History  Substance Use Topics  . Smoking status: Never Smoker  . Smokeless tobacco: Never Used  . Alcohol use Yes     Comment: occ     Allergies     Patient has no known allergies.   Review of Systems Review of Systems  Constitutional: Negative for fever.  HENT: Negative for sore throat.   Eyes: Negative for visual disturbance.  Respiratory: Negative for shortness of breath.   Cardiovascular: Negative for chest pain.  Gastrointestinal: Negative for abdominal pain, nausea and vomiting.  Genitourinary: Positive for difficulty urinating. Negative for dysuria.  Musculoskeletal: Positive for back pain. Negative for neck stiffness.  Skin: Negative for rash.  Neurological: Positive for weakness and numbness. Negative for syncope and headaches.     Physical Exam Updated Vital Signs BP 124/72   Pulse 72   Temp 97.6 F (36.4 C)   Resp 16   Wt 87.1 kg (192 lb)   SpO2 99%   Physical Exam  Constitutional: He is oriented to person, place, and time. He appears well-developed and well-nourished. No distress.  HENT:  Head: Normocephalic and atraumatic.  Eyes: Conjunctivae and EOM are normal.  Neck: Normal range of motion.  Cardiovascular: Normal rate, regular rhythm, normal heart sounds and intact distal pulses.  Exam reveals no gallop and no friction rub.   No murmur heard. Pulmonary/Chest: Effort normal and breath sounds normal. No respiratory distress. He has no wheezes. He has no rales.  Abdominal: Soft. He exhibits no distension.  There is no tenderness. There is no guarding.  Musculoskeletal: He exhibits no edema.       Thoracic back: He exhibits no tenderness and no bony tenderness.       Lumbar back: He exhibits no tenderness and no bony tenderness.  Neurological: He is alert and oriented to person, place, and time. GCS eye subscore is 4. GCS verbal subscore is 5. GCS motor subscore is 6.  Patient able to resist strength bilaterally however on right he has weakness of hip flexion/extension, plantar/dorsiflexion Reports altered sensation bilateral feet, reports mildly decreased prox thigh    Skin: Skin is warm and dry. He is  not diaphoretic.  Nursing note and vitals reviewed.    ED Treatments / Results  Labs (all labs ordered are listed, but only abnormal results are displayed) Labs Reviewed  CBC WITH DIFFERENTIAL/PLATELET - Abnormal; Notable for the following:       Result Value   WBC 10.6 (*)    RBC 3.53 (*)    Hemoglobin 11.0 (*)    HCT 31.7 (*)    Monocytes Absolute 1.2 (*)    All other components within normal limits  COMPREHENSIVE METABOLIC PANEL - Abnormal; Notable for the following:    Glucose, Bld 101 (*)    BUN 26 (*)    Creatinine, Ser 1.60 (*)    Calcium 10.7 (*)    Alkaline Phosphatase 30 (*)    GFR calc non Af Amer 43 (*)    GFR calc Af Amer 50 (*)    All other components within normal limits  URINALYSIS, ROUTINE W REFLEX MICROSCOPIC - Abnormal; Notable for the following:    Hgb urine dipstick SMALL (*)    All other components within normal limits    EKG  EKG Interpretation None       Radiology No results found.  Procedures Procedures (including critical care time)  Medications Ordered in ED Medications  diazepam (VALIUM) tablet 5 mg (5 mg Oral Given 09/02/16 1225)     Initial Impression / Assessment and Plan / ED Course  I have reviewed the triage vital signs and the nursing notes.  Pertinent labs & imaging results that were available during my care of the patient were reviewed by me and considered in my medical decision making (see chart for details).     66 year old male with no significant medical history presents with concern for back pain, numbness, weakness and urinary retention.  Patient with good strength at baseline, able to resist with relatively good strength bilaterally, however right leg significantly weak in comparison to left both with hip flexion, extension, ankle plantar/dorsiflexion.   Patient with urinary retention on post-void, however able to urinate more after this. Discussed will repeat at [facility], if elevated place foley cathter.  Cr 1.6,  no prior for comparison.  Ordered MRI thoracic given location of pain and lumbar given concern for possible cauda equina.  MRI was ordered, however unable to complete scan per [facility] MRI staff today.  Called to discuss and expedite scan however told it should be done at [facility].  Called [facility] to discuss expediting scan, and spoke with accepting physician in ED.  Patient going by personal vehicle.  Discussed with NSU team, ED and MRI.  Patient to have MRI, likely foley placement at [facility]   Final Clinical Impressions(s) / ED Diagnoses   Final diagnoses:  Weakness of right leg  Urinary retention    New Prescriptions New Prescriptions  No medications on file     Gareth Morgan, MD 09/02/16 [phone]

## 2016-09-02 NOTE — ED Notes (Signed)
Pt attempting to void again

## 2016-09-02 NOTE — ED Notes (Signed)
Pt given crackers and Ginger ale.

## 2016-09-02 NOTE — ED Notes (Signed)
Pt instructed to be careful with IV site and go straight to [facility] ED.  Pt assisted into POV and has paperwork

## 2016-09-03 ENCOUNTER — Other Ambulatory Visit: Payer: Self-pay | Admitting: Neurological Surgery

## 2016-09-03 ENCOUNTER — Observation Stay (HOSPITAL_COMMUNITY): Payer: Medicare Other

## 2016-09-03 DIAGNOSIS — D72829 Elevated white blood cell count, unspecified: Secondary | ICD-10-CM

## 2016-09-03 DIAGNOSIS — D62 Acute posthemorrhagic anemia: Secondary | ICD-10-CM | POA: Diagnosis not present

## 2016-09-03 DIAGNOSIS — G9619 Other disorders of meninges, not elsewhere classified: Secondary | ICD-10-CM

## 2016-09-03 DIAGNOSIS — Z8249 Family history of ischemic heart disease and other diseases of the circulatory system: Secondary | ICD-10-CM | POA: Diagnosis not present

## 2016-09-03 DIAGNOSIS — G9529 Other cord compression: Secondary | ICD-10-CM | POA: Diagnosis not present

## 2016-09-03 DIAGNOSIS — G959 Disease of spinal cord, unspecified: Secondary | ICD-10-CM | POA: Diagnosis not present

## 2016-09-03 DIAGNOSIS — R339 Retention of urine, unspecified: Secondary | ICD-10-CM

## 2016-09-03 DIAGNOSIS — W19XXXA Unspecified fall, initial encounter: Secondary | ICD-10-CM | POA: Diagnosis present

## 2016-09-03 DIAGNOSIS — D497 Neoplasm of unspecified behavior of endocrine glands and other parts of nervous system: Secondary | ICD-10-CM | POA: Diagnosis not present

## 2016-09-03 DIAGNOSIS — R634 Abnormal weight loss: Secondary | ICD-10-CM | POA: Diagnosis not present

## 2016-09-03 DIAGNOSIS — D649 Anemia, unspecified: Secondary | ICD-10-CM | POA: Diagnosis not present

## 2016-09-03 DIAGNOSIS — C799 Secondary malignant neoplasm of unspecified site: Secondary | ICD-10-CM | POA: Diagnosis not present

## 2016-09-03 DIAGNOSIS — R29898 Other symptoms and signs involving the musculoskeletal system: Secondary | ICD-10-CM | POA: Diagnosis present

## 2016-09-03 DIAGNOSIS — G9589 Other specified diseases of spinal cord: Secondary | ICD-10-CM | POA: Diagnosis not present

## 2016-09-03 DIAGNOSIS — G988 Other disorders of nervous system: Secondary | ICD-10-CM | POA: Diagnosis not present

## 2016-09-03 DIAGNOSIS — N179 Acute kidney failure, unspecified: Secondary | ICD-10-CM | POA: Diagnosis present

## 2016-09-03 DIAGNOSIS — D492 Neoplasm of unspecified behavior of bone, soft tissue, and skin: Secondary | ICD-10-CM

## 2016-09-03 DIAGNOSIS — M8588 Other specified disorders of bone density and structure, other site: Secondary | ICD-10-CM | POA: Diagnosis not present

## 2016-09-03 DIAGNOSIS — R531 Weakness: Secondary | ICD-10-CM | POA: Diagnosis not present

## 2016-09-03 DIAGNOSIS — G9520 Unspecified cord compression: Secondary | ICD-10-CM | POA: Diagnosis not present

## 2016-09-03 DIAGNOSIS — K5909 Other constipation: Secondary | ICD-10-CM | POA: Diagnosis not present

## 2016-09-03 LAB — CBC
HCT: 31.8 % — ABNORMAL LOW (ref 39.0–52.0)
Hemoglobin: 10.8 g/dL — ABNORMAL LOW (ref 13.0–17.0)
MCH: 31.3 pg (ref 26.0–34.0)
MCHC: 34 g/dL (ref 30.0–36.0)
MCV: 92.2 fL (ref 78.0–100.0)
Platelets: 337 10*3/uL (ref 150–400)
RBC: 3.45 MIL/uL — ABNORMAL LOW (ref 4.22–5.81)
RDW: 13.9 % (ref 11.5–15.5)
WBC: 9.9 10*3/uL (ref 4.0–10.5)

## 2016-09-03 LAB — COMPREHENSIVE METABOLIC PANEL
ALT: 16 U/L — ABNORMAL LOW (ref 17–63)
AST: 20 U/L (ref 15–41)
Albumin: 4.1 g/dL (ref 3.5–5.0)
Alkaline Phosphatase: 28 U/L — ABNORMAL LOW (ref 38–126)
Anion gap: 8 (ref 5–15)
BUN: 18 mg/dL (ref 6–20)
CO2: 24 mmol/L (ref 22–32)
Calcium: 10.3 mg/dL (ref 8.9–10.3)
Chloride: 105 mmol/L (ref 101–111)
Creatinine, Ser: 1.14 mg/dL (ref 0.61–1.24)
GFR calc Af Amer: >60 mL/min (ref >60)
GFR calc non Af Amer: >60 mL/min (ref >60)
Glucose, Bld: 148 mg/dL — ABNORMAL HIGH (ref 65–99)
Potassium: 3.9 mmol/L (ref 3.5–5.1)
Sodium: 137 mmol/L (ref 135–145)
Total Bilirubin: 0.7 mg/dL (ref 0.3–1.2)
Total Protein: 6.8 g/dL (ref 6.5–8.1)

## 2016-09-03 LAB — CBC WITH DIFFERENTIAL/PLATELET
Basophils Absolute: 0 10*3/uL (ref 0.0–0.1)
Basophils Relative: 0 %
Eosinophils Absolute: 0 10*3/uL (ref 0.0–0.7)
Eosinophils Relative: 0 %
HCT: 31.8 % — ABNORMAL LOW (ref 39.0–52.0)
Hemoglobin: 10.9 g/dL — ABNORMAL LOW (ref 13.0–17.0)
Lymphocytes Relative: 7 %
Lymphs Abs: 0.9 10*3/uL (ref 0.7–4.0)
MCH: 31.2 pg (ref 26.0–34.0)
MCHC: 34.3 g/dL (ref 30.0–36.0)
MCV: 91.1 fL (ref 78.0–100.0)
Monocytes Absolute: 0.2 10*3/uL (ref 0.1–1.0)
Monocytes Relative: 1 %
Neutro Abs: 11.3 10*3/uL — ABNORMAL HIGH (ref 1.7–7.7)
Neutrophils Relative %: 92 %
Platelets: 350 10*3/uL (ref 150–400)
RBC: 3.49 MIL/uL — ABNORMAL LOW (ref 4.22–5.81)
RDW: 13.8 % (ref 11.5–15.5)
WBC: 12.4 10*3/uL — ABNORMAL HIGH (ref 4.0–10.5)

## 2016-09-03 LAB — BASIC METABOLIC PANEL
Anion gap: 7 (ref 5–15)
BUN: 19 mg/dL (ref 6–20)
CO2: 27 mmol/L (ref 22–32)
Calcium: 10.5 mg/dL — ABNORMAL HIGH (ref 8.9–10.3)
Chloride: 104 mmol/L (ref 101–111)
Creatinine, Ser: 1.34 mg/dL — ABNORMAL HIGH (ref 0.61–1.24)
GFR calc Af Amer: >60 mL/min (ref >60)
GFR calc non Af Amer: 53 mL/min — ABNORMAL LOW (ref >60)
Glucose, Bld: 115 mg/dL — ABNORMAL HIGH (ref 65–99)
Potassium: 4 mmol/L (ref 3.5–5.1)
Sodium: 138 mmol/L (ref 135–145)

## 2016-09-03 LAB — C-REACTIVE PROTEIN: CRP: <0.8 mg/dL (ref <1.0)

## 2016-09-03 LAB — PHOSPHORUS: Phosphorus: 3.8 mg/dL (ref 2.5–4.6)

## 2016-09-03 LAB — TSH: TSH: 0.94 u[IU]/mL (ref 0.350–4.500)

## 2016-09-03 LAB — MAGNESIUM: Magnesium: 1.8 mg/dL (ref 1.7–2.4)

## 2016-09-03 LAB — SEDIMENTATION RATE: Sed Rate: 20 mm/hr — ABNORMAL HIGH (ref 0–16)

## 2016-09-03 MED ORDER — IOPAMIDOL (ISOVUE-300) INJECTION 61%
INTRAVENOUS | Status: AC
  Administered 2016-09-03: 30 mL
  Filled 2016-09-03: qty 30

## 2016-09-03 MED ORDER — CHLORHEXIDINE GLUCONATE CLOTH 2 % EX PADS: 6.0000 | MEDICATED_PAD | Freq: Once | CUTANEOUS | Status: DC

## 2016-09-03 MED ORDER — CEFAZOLIN SODIUM-DEXTROSE 2-4 GM/100ML-% IV SOLN: 2.0000 g | INTRAVENOUS | Status: DC

## 2016-09-03 MED ORDER — SENNA 8.6 MG PO TABS
1.0000 | ORAL_TABLET | Freq: Every day | ORAL | Status: DC
  Administered 2016-09-03: 8.6 mg via ORAL
  Filled 2016-09-03: qty 1

## 2016-09-03 MED ORDER — CEFAZOLIN SODIUM-DEXTROSE 2-4 GM/100ML-% IV SOLN
2.0000 g | INTRAVENOUS | Status: AC
  Administered 2016-09-04: 2 g via INTRAVENOUS
  Filled 2016-09-03: qty 100

## 2016-09-03 MED ORDER — PANTOPRAZOLE SODIUM 40 MG PO TBEC
40.0000 mg | DELAYED_RELEASE_TABLET | Freq: Every day | ORAL | Status: DC
  Administered 2016-09-03: 40 mg via ORAL
  Filled 2016-09-03: qty 1

## 2016-09-03 MED ORDER — CHLORHEXIDINE GLUCONATE CLOTH 2 % EX PADS
6.0000 | MEDICATED_PAD | Freq: Once | CUTANEOUS | Status: AC
  Administered 2016-09-03: 6 via TOPICAL

## 2016-09-03 NOTE — Progress Notes (Signed)
Patient arrived to floor with a foley catheter that had been placed in [facility] ED. This RN spoke with Mike,RN to get report before patient came from the ED to [place] stated that he would ensure the techs who had inserted the foley catheter would document placing the foley catheter before the patient was sent to this unit. After patient arrived to the floor, insertion of the foley catheter still had not been documented. After settling the patient in 5W12, this RN called [facility] ED and spoke with Charge RN, Raquel Sarna. After telling her about the situation, she informed this RN that the techs who had inserted the foley catheter were still working tonight and that she would have them document inserting the foley catheter for this patient. At this time, no documentation of who inserted this foley catheter in the ED has been charted.

## 2016-09-03 NOTE — Progress Notes (Signed)
Surgery scheduled for tomorrow Pt notified. NPO at midnight

## 2016-09-03 NOTE — Progress Notes (Signed)
No acute events Lower extremity strength improving with steroids I have recommended T4-6 laminectomy for tumor resection We have discussed the risks, benefits, and alternatives to surgery and he wishes to proceed. Will likely be Friday, or tomorrow if I can find time in the OR.

## 2016-09-03 NOTE — Progress Notes (Signed)
NURSING PROGRESS NOTE  Alexander Mazzocco BookerMRN: [id number] Admission Data: 09/02/16 at [place] Attending Provider: Norval Morton, MD PCP: Benito Mccreedy, MD Code status: Full  Allergies: No Known Allergies  Past Medical History: History reviewed. No pertinent past medical history.  Past Surgical History:  Past Surgical History:  Procedure Laterality Date  . APPENDECTOMY    . KNEE ARTHROSCOPY     r/knee   Alexander Saunders is a 66 y.o. male patient, arrived to floor in room [phone] via stretcher, transferred from ED. Patient alert and oriented X 4. No acute distress noted. Complains of pain 2/10 in is back. PRN pain med given in the ED.   Vital signs: Oral temperature 97.9 F (36.6 C), Blood pressure 126/66, Pulse 71, RR 18, SpO2 100 % on room air. Height 6', weight 197.6 lbs (89.54 kg).   IV access: Left AC-infusing Normal Saline at 18mL/hr; condition patent and no redness.  Skin: intact, no pressure ulcer noted in sacral area. Second verified by Elsie Lincoln  Patient's ID armband verified with patient and in place. Information packet given to patient. Fall risk assessed, SR up X2, patient able to verbalize understanding of risks associated with falls and to call nurse or staff to assist before getting out of bed. Patient oriented to room and equipment. Call bell within reach.

## 2016-09-03 NOTE — Progress Notes (Signed)
PROGRESS NOTE    Alexander Saunders  FMB:[id number] DOB: 11/11/1950 DOA: 09/02/2016 PCP: Benito Mccreedy, MD   Brief Narrative:  Alexander Saunders is a 66 y.o. male without a significant past medical history who presented with complaints of lower extremity weakness. For the last 8 weeks patient has complained of pain on his left mid-lower back that would radiate to the side of his chest /pectoral muscle. Pain continued intermittently become sharp as he notes with coughing. He is also reported progressively worsening weakness on the right leg worse than the left and reports numbness and tingling sensation. The patient is very active at baseline and works out on a daily basis. He also reports weight loss of approximately 8 pounds. He has been taking ibuprofen 800 mg since the onset of symptoms with only mild relief. It seems he initially attributed pain to muscle strain. His wife reports that he went hiking a few weeks ago but he does report being bitten by any ticks. However, last night patient had gotten up to use the bathroom and had fallen due to the right leg weakness. Since that time he also reports that he has not been able to urinate and has had some constipation Foley catheter placed for urinary retention with at least 435m of urine out. Neurosurgery was consulted and saw the patient for a mass of thoracic spine found on MRI. Patient underwent CT of Chest/Abd/Pelvis for further Metastatic workup which was unrevealing except a illiac lesion. Patient to undergo T4-T6 Laminectomy in AM. Case was discussed with Dr. SAlen Blewof Oncology.   Assessment & Plan:   Principal Problem:   Epidural mass Active Problems:   Lower extremity weakness   Acute kidney injury (HCC)   Urinary retention   Leukocytosis   Hypercalcemia  Lower Extremity Weakness 2/2 Epidural Mass with Cord Compression concern for Metastatic Disease vs Multiple Myeloma  -Acute. Patient presents with a week history of progressively  worsening back pain and lower extremity weakness. MRI reveals epidural mass with signs of metastatic disease or multiple myeloma likely causing symptoms. Patient's last colonoscopy in 2010. - Admitted to a [facility] - Will need to call PCP also is in a.m. to obtain last PSA results preformed in 07/2016 - Continue IV Dexamethasone 4 mg IV q6h; Added Pantoprazole 40 mg po Daily - Hydrocodone-Acetaminophen 1 tab po q4hprn for Moderate Pain and C/w Senna 8.6 mg po Daily - Check TSH was 0.940, ESR (20), CRP (<0.8), SPEP pending - Will needs further metastatic workup; Obtained CT Chest/Abd/Pelvis w/o Contrast and showed Multiple lucencies are noted throughout the spine and pelvis concerning for metastatic disease or multiple myeloma. The largest measures 2.8 cm in the posterior aspect of the right acetabulum. No other abnormality seen in the chest, abdomen or pelvis. - Discussed with Dr. SAlen Blewof Oncology and recommends no further workup at this time as patient is going for T4-T6 Laminectomy for Tumor Resection done by Dr. BMarland KitchenDitty; Will obtain Biopsy then  - Physical Therapy to Eval and Treat - Appreciate neurosurgery consultative services will follow-up for further recommendations; Patient to undergo T4-T6 Laminectomy in AM -Neurosurgery ordering CT T Spine Limited w/o or w Contrast  Acute kidney injury with Urinary Retention, improving -Baseline creatinine unknown at this time but presents with creatinine 1.6 and BUN 26.  -BUN/Cr went from 26/1.60 -> 19/1.34 -> 18/1.14 -Foley catheter placed with 4091murine out. Urinalysis otherwise negative.  -Suspect acute kidney injury likely related to recent use of NSAIDs along with  urinary retention. - Continue Foley catheter - IV fluids of NS at 75 ml/hr - Check urine FENa (Urine Lytes still pending) - Checked Renal Ultrasound and was Negative Exam - Discontinued nephrotoxic agents including ibuprofen - Continue to Monitor and repeat CMP in  AM  Leukocytosis - WBC 10.6 on admission. WBC went from 10.6 -> 9.9 -> 12.4 - Suspect likely reactive to above in conjunction with IV Steroid Demargination - Urinalysis negative for any signs of infection. - Check CBC in a.m.  Normocytic Normochromic Anemia  - Hemoglobin 11 on admission with no previous baseline available - Hb/Hct went from 11.0/31.7 -> 10.8/31.8 -> 10.9/31.8 - Continue to Monitor for S/Sx of Infection and Repeat CBC in AM  Constipation -C/w Senna 8.6 mg po Daily and IVD Rehydration -Consider adding Polyethylene Glycol   Hypercalcemia  -Acute. Initial calcium 10.7. - IV fluids as seen above - Check ionized calcium in a.m.  DVT prophylaxis: SCDs Code Status: FULL CODE Family Communication: Discussed with wife at bedside Disposition Plan: Remain Inpatient for T4-T6 Laminectomy in AM   Consultants:   Neurosurgery Dr. [name] Ditty  Discussed Case with Oncology Dr. Zola Button   Procedures: T4-T6 Laminectomy scheduled for AM   Antimicrobials:  Anti-infectives    None     Subjective: Seen and examined and thought he strength had improved. Was surprised about concern of metastatic disease. Denied any chest pain, nausea or vomiting. No other concerns or complaints at this time.   Objective: Vitals:   09/02/16 1534 09/02/16 2237 09/02/16 2356 09/03/16 0533  BP: 124/72 125/62 126/66 129/63  Pulse: 72 73 71 77  Resp: '16 16 18 19  '$ Temp: 97.6 F (36.4 C)  97.9 F (36.6 C) 97.8 F (36.6 C)  TempSrc:   Oral Oral  SpO2: 99% 98% 100% 98%  Weight:   89.5 kg (197 lb 6.4 oz)   Height:   6' (1.829 m)     Intake/Output Summary (Last 24 hours) at 09/03/16 0817 Last data filed at 09/03/16 0645  Gross per 24 hour  Intake              675 ml  Output             1200 ml  Net             -525 ml   Filed Weights   09/02/16 1033 09/02/16 2356  Weight: 87.1 kg (192 lb) 89.5 kg (197 lb 6.4 oz)    Examination: Physical Exam:  Constitutional: WN/WD,  NAD and appears calm and comfortable Eyes: Lids and conjunctivae normal, sclerae anicteric  ENMT: External Ears, Nose appear normal. Grossly normal hearing. Mucous membranes are moist. Neck: Appears normal, supple, no cervical masses, normal ROM, no appreciable thyromegaly Respiratory: Clear to auscultation bilaterally, no wheezing, rales, rhonchi or crackles. Normal respiratory effort and patient is not tachypenic. No accessory muscle use.  Cardiovascular: RRR, no murmurs / rubs / gallops. S1 and S2 auscultated. No extremity edema. Abdomen: Soft, non-tender, non-distended. No masses palpated. No appreciable hepatosplenomegaly. Bowel sounds positive.  GU: Deferred. Musculoskeletal: No clubbing / cyanosis of digits/nails. No joint deformity upper and lower extremities. Good ROM, no contractures. Diminished strength in the lower extremities.  Skin: No rashes, lesions, ulcers on a limited skin evaluation. No induration; Warm and dry.  Neurologic: CN 2-12 grossly intact with no focal deficits. Mildly Diminished sensation in Right Lower Extremity . Romberg sign cerebellar reflexes not assessed.  Psychiatric: Normal judgment and insight. Alert and oriented  x 3. Normal mood and appropriate affect.   Data Reviewed: I have personally reviewed following labs and imaging studies  CBC:  Recent Labs Lab 09/02/16 1208 09/02/16 2359  WBC 10.6* 9.9  NEUTROABS 7.1  --   HGB 11.0* 10.8*  HCT 31.7* 31.8*  MCV 89.8 92.2  PLT 321 341   Basic Metabolic Panel:  Recent Labs Lab 09/02/16 1208 09/02/16 2359  NA 140 138  K 4.0 4.0  CL 107 104  CO2 24 27  GLUCOSE 101* 115*  BUN 26* 19  CREATININE 1.60* 1.34*  CALCIUM 10.7* 10.5*   GFR: Estimated Creatinine Clearance: 58.7 mL/min (A) (by C-G formula based on SCr of 1.34 mg/dL (H)). Liver Function Tests:  Recent Labs Lab 09/02/16 1208  AST 24  ALT 18  ALKPHOS 30*  BILITOT 0.8  PROT 7.5  ALBUMIN 4.6   No results for input(s): LIPASE,  AMYLASE in the last 168 hours. No results for input(s): AMMONIA in the last 168 hours. Coagulation Profile: No results for input(s): INR, PROTIME in the last 168 hours. Cardiac Enzymes: No results for input(s): CKTOTAL, CKMB, CKMBINDEX, TROPONINI in the last 168 hours. BNP (last 3 results) No results for input(s): PROBNP in the last 8760 hours. HbA1C: No results for input(s): HGBA1C in the last 72 hours. CBG: No results for input(s): GLUCAP in the last 168 hours. Lipid Profile: No results for input(s): CHOL, HDL, LDLCALC, TRIG, CHOLHDL, LDLDIRECT in the last 72 hours. Thyroid Function Tests:  Recent Labs  09/02/16 2359  TSH 0.940   Anemia Panel: No results for input(s): VITAMINB12, FOLATE, FERRITIN, TIBC, IRON, RETICCTPCT in the last 72 hours. Sepsis Labs: No results for input(s): PROCALCITON, LATICACIDVEN in the last 168 hours.  No results found for this or any previous visit (from the past 240 hour(s)).   Radiology Studies: Dg Chest 1 View  Result Date: 09/02/2016 CLINICAL DATA:  Weakness EXAM: CHEST 1 VIEW COMPARISON:  None. FINDINGS: Lungs are clear.  No pleural effusion or pneumothorax. The heart is normal in size. IMPRESSION: No evidence of acute cardiopulmonary disease. Electronically Signed   By: Julian Hy M.D.   On: 09/02/2016 22:55   Mr Thoracic Spine W Wo Contrast  Result Date: 09/02/2016 CLINICAL DATA:  Right leg weakness.  Difficulty urinating. EXAM: MRI THORACIC AND LUMBAR SPINE WITHOUT AND WITH CONTRAST TECHNIQUE: Multiplanar and multiecho pulse sequences of the thoracic and lumbar spine were obtained without and with intravenous contrast. CONTRAST:  49m MULTIHANCE GADOBENATE DIMEGLUMINE 529 MG/ML IV SOLN COMPARISON:  None. FINDINGS: MRI THORACIC SPINE FINDINGS Alignment:  Physiologic. Vertebrae: There is diffuse signal abnormality throughout the thoracic vertebral column bone marrow with innumerable low T1 weighted signal lesions that show enhancement  following contrast administration. There is no compression fracture. There is an infiltrative T5 lesion occupying the left aspect of the vertebral body and extending into the posterior elements with a large dorsal epidural component that extends from T4-T6 and measures 1.0 x 1.4 x 6.6 cm (AP x Transverse x CC). This mass anteriorly displaces and compresses the spinal cord. Cord: There is no visible cord signal change. No syrinx or abnormal spinal cord enhancement. Paraspinal and other soft tissues: There is abnormal enhancement of the interspinous soft tissues at the T4-T6 levels. Disc levels: As above, the dorsal epidural mass at the T4-T6 levels effaces the thecal sac and compresses the spinal cord. Otherwise, there is no thoracic spinal canal stenosis. There is mild extension of the mass into the bilateral T5-T6 neural  foramina, but the foramina remain patent with mild-to-moderate stenosis. MRI LUMBAR SPINE FINDINGS Segmentation:  Standard Alignment:  Normal Vertebrae: Diffusely abnormal bone marrow signal. No compression fracture. Conus medullaris: Extends to the L1 level and appears normal. No lumbar epidural disease. No compression of the cauda equina nerve roots. Paraspinal and other soft tissues: The visualized aorta, IVC and iliac vessels are normal. The visualized retroperitoneal organs and paraspinal soft tissues are normal. Disc levels: T12-L1: Normal disc space and facets. No spinal canal or neuroforaminal stenosis. L1-L2: Small central disc protrusion without spinal canal or neural foraminal stenosis. L2-L3: Minimal disc bulge without stenosis. L3-L4: Mild disc bulge without stenosis. L4-L5: Mild bilateral facet hypertrophy with disc desiccation and small bulge. No spinal canal stenosis. Mild bilateral neural foraminal stenosis. L5-S1: Normal disc space and facets. No spinal canal or neuroforaminal stenosis. Visualized sacrum: Diffuse sacral bone marrow abnormality, as throughout the thoracic and  lumbar spine. IMPRESSION: 1. Dorsal epidural mass at the T4-T6 level resulting in compression of the thoracic spinal cord. 2. Diffusely abnormal bone marrow signal and contrast enhancement, consistent with a marrow replacement process such as multiple myeloma versus diffuse osseous metastatic disease. 3. No pathologic compression fracture. Critical Value/emergent results were called by telephone at the time of interpretation on 09/02/2016 at 8:34 pm to Dr. Fredia Sorrow, who verbally acknowledged these results. Electronically Signed   By: Ulyses Jarred M.D.   On: 09/02/2016 20:46   Mr Lumbar Spine W Wo Contrast  Result Date: 09/02/2016 CLINICAL DATA:  Right leg weakness.  Difficulty urinating. EXAM: MRI THORACIC AND LUMBAR SPINE WITHOUT AND WITH CONTRAST TECHNIQUE: Multiplanar and multiecho pulse sequences of the thoracic and lumbar spine were obtained without and with intravenous contrast. CONTRAST:  70m MULTIHANCE GADOBENATE DIMEGLUMINE 529 MG/ML IV SOLN COMPARISON:  None. FINDINGS: MRI THORACIC SPINE FINDINGS Alignment:  Physiologic. Vertebrae: There is diffuse signal abnormality throughout the thoracic vertebral column bone marrow with innumerable low T1 weighted signal lesions that show enhancement following contrast administration. There is no compression fracture. There is an infiltrative T5 lesion occupying the left aspect of the vertebral body and extending into the posterior elements with a large dorsal epidural component that extends from T4-T6 and measures 1.0 x 1.4 x 6.6 cm (AP x Transverse x CC). This mass anteriorly displaces and compresses the spinal cord. Cord: There is no visible cord signal change. No syrinx or abnormal spinal cord enhancement. Paraspinal and other soft tissues: There is abnormal enhancement of the interspinous soft tissues at the T4-T6 levels. Disc levels: As above, the dorsal epidural mass at the T4-T6 levels effaces the thecal sac and compresses the spinal cord.  Otherwise, there is no thoracic spinal canal stenosis. There is mild extension of the mass into the bilateral T5-T6 neural foramina, but the foramina remain patent with mild-to-moderate stenosis. MRI LUMBAR SPINE FINDINGS Segmentation:  Standard Alignment:  Normal Vertebrae: Diffusely abnormal bone marrow signal. No compression fracture. Conus medullaris: Extends to the L1 level and appears normal. No lumbar epidural disease. No compression of the cauda equina nerve roots. Paraspinal and other soft tissues: The visualized aorta, IVC and iliac vessels are normal. The visualized retroperitoneal organs and paraspinal soft tissues are normal. Disc levels: T12-L1: Normal disc space and facets. No spinal canal or neuroforaminal stenosis. L1-L2: Small central disc protrusion without spinal canal or neural foraminal stenosis. L2-L3: Minimal disc bulge without stenosis. L3-L4: Mild disc bulge without stenosis. L4-L5: Mild bilateral facet hypertrophy with disc desiccation and small bulge. No spinal canal  stenosis. Mild bilateral neural foraminal stenosis. L5-S1: Normal disc space and facets. No spinal canal or neuroforaminal stenosis. Visualized sacrum: Diffuse sacral bone marrow abnormality, as throughout the thoracic and lumbar spine. IMPRESSION: 1. Dorsal epidural mass at the T4-T6 level resulting in compression of the thoracic spinal cord. 2. Diffusely abnormal bone marrow signal and contrast enhancement, consistent with a marrow replacement process such as multiple myeloma versus diffuse osseous metastatic disease. 3. No pathologic compression fracture. Critical Value/emergent results were called by telephone at the time of interpretation on 09/02/2016 at 8:34 pm to Dr. Fredia Sorrow, who verbally acknowledged these results. Electronically Signed   By: Ulyses Jarred M.D.   On: 09/02/2016 20:46   US Renal  Result Date: 09/03/2016 CLINICAL DATA:  Acute kidney injury. EXAM: RENAL / URINARY TRACT ULTRASOUND COMPLETE  COMPARISON:  None. FINDINGS: Right Kidney: Length: 12.3 cm. Echogenicity within normal limits. No mass or hydronephrosis visualized. Left Kidney: Length: 11.3 cm. Echogenicity within normal limits. No mass or hydronephrosis visualized. Bladder: Decompressed with a Foley catheter. IMPRESSION: Negative exam. Electronically Signed   By: Staci Righter M.D.   On: 09/03/2016 07:52   Scheduled Meds: . dexamethasone  4 mg Intravenous Q6H  . enoxaparin (LOVENOX) injection  40 mg Subcutaneous Q24H  . senna  1 tablet Oral Daily  . sodium chloride flush  3 mL Intravenous Q12H   Continuous Infusions: . sodium chloride 75 mL/hr at 09/03/16 0406    LOS: 0 days   Kerney Elbe, DO [facility] Pager [phone]  If 7PM-7AM, please contact night-coverage [email or web address] Password [email or web address] 09/03/2016, 8:17 AM

## 2016-09-04 ENCOUNTER — Inpatient Hospital Stay (HOSPITAL_COMMUNITY): Payer: Medicare Other

## 2016-09-04 ENCOUNTER — Inpatient Hospital Stay (HOSPITAL_COMMUNITY): Payer: Medicare Other | Admitting: Anesthesiology

## 2016-09-04 ENCOUNTER — Encounter (HOSPITAL_COMMUNITY)
Admission: EM | Disposition: A | Discharge status: Home Health | Payer: Self-pay | Source: Home / Self Care | Attending: Internal Medicine

## 2016-09-04 ENCOUNTER — Encounter (HOSPITAL_COMMUNITY): Payer: Self-pay | Admitting: Orthopedic Surgery

## 2016-09-04 DIAGNOSIS — D649 Anemia, unspecified: Secondary | ICD-10-CM

## 2016-09-04 DIAGNOSIS — D4989 Neoplasm of unspecified behavior of other specified sites: Secondary | ICD-10-CM

## 2016-09-04 HISTORY — PX: LAMINECTOMY: SHX219

## 2016-09-04 HISTORY — DX: Neoplasm of unspecified behavior of other specified sites: D49.89

## 2016-09-04 LAB — COMPREHENSIVE METABOLIC PANEL
ALT: 22 U/L (ref 17–63)
AST: 26 U/L (ref 15–41)
Albumin: 3.7 g/dL (ref 3.5–5.0)
Alkaline Phosphatase: 26 U/L — ABNORMAL LOW (ref 38–126)
Anion gap: 7 (ref 5–15)
BUN: 16 mg/dL (ref 6–20)
CO2: 23 mmol/L (ref 22–32)
Calcium: 10 mg/dL (ref 8.9–10.3)
Chloride: 110 mmol/L (ref 101–111)
Creatinine, Ser: 1.02 mg/dL (ref 0.61–1.24)
GFR calc Af Amer: >60 mL/min (ref >60)
GFR calc non Af Amer: >60 mL/min (ref >60)
Glucose, Bld: 120 mg/dL — ABNORMAL HIGH (ref 65–99)
Potassium: 3.9 mmol/L (ref 3.5–5.1)
Sodium: 140 mmol/L (ref 135–145)
Total Bilirubin: 0.4 mg/dL (ref 0.3–1.2)
Total Protein: 6.4 g/dL — ABNORMAL LOW (ref 6.5–8.1)

## 2016-09-04 LAB — CBC WITH DIFFERENTIAL/PLATELET
Basophils Absolute: 0 10*3/uL (ref 0.0–0.1)
Basophils Relative: 0 %
Eosinophils Absolute: 0 10*3/uL (ref 0.0–0.7)
Eosinophils Relative: 0 %
HCT: 31.1 % — ABNORMAL LOW (ref 39.0–52.0)
Hemoglobin: 10.6 g/dL — ABNORMAL LOW (ref 13.0–17.0)
Lymphocytes Relative: 6 %
Lymphs Abs: 0.9 10*3/uL (ref 0.7–4.0)
MCH: 31.2 pg (ref 26.0–34.0)
MCHC: 34.1 g/dL (ref 30.0–36.0)
MCV: 91.5 fL (ref 78.0–100.0)
Monocytes Absolute: 0.8 10*3/uL (ref 0.1–1.0)
Monocytes Relative: 5 %
Neutro Abs: 13 10*3/uL — ABNORMAL HIGH (ref 1.7–7.7)
Neutrophils Relative %: 89 %
Platelets: 351 10*3/uL (ref 150–400)
RBC: 3.4 MIL/uL — ABNORMAL LOW (ref 4.22–5.81)
RDW: 14 % (ref 11.5–15.5)
WBC: 14.6 10*3/uL — ABNORMAL HIGH (ref 4.0–10.5)

## 2016-09-04 LAB — PROTEIN ELECTROPHORESIS, SERUM
A/G Ratio: 1.5 (ref 0.7–1.7)
Albumin ELP: 4.1 g/dL (ref 2.9–4.4)
Alpha-1-Globulin: 0.2 g/dL (ref 0.0–0.4)
Alpha-2-Globulin: 0.8 g/dL (ref 0.4–1.0)
Beta Globulin: 1 g/dL (ref 0.7–1.3)
Gamma Globulin: 0.7 g/dL (ref 0.4–1.8)
Globulin, Total: 2.7 g/dL (ref 2.2–3.9)
Total Protein ELP: 6.8 g/dL (ref 6.0–8.5)

## 2016-09-04 LAB — PROTIME-INR
INR: 0.99
Prothrombin Time: 13.1 seconds (ref 11.4–15.2)

## 2016-09-04 LAB — ABO/RH: ABO/RH(D): O POS

## 2016-09-04 LAB — SURGICAL PCR SCREEN
MRSA, PCR: NEGATIVE
Staphylococcus aureus: NEGATIVE

## 2016-09-04 LAB — MAGNESIUM: Magnesium: 1.8 mg/dL (ref 1.7–2.4)

## 2016-09-04 LAB — PREPARE RBC (CROSSMATCH)

## 2016-09-04 LAB — PHOSPHORUS: Phosphorus: 3.2 mg/dL (ref 2.5–4.6)

## 2016-09-04 LAB — APTT: aPTT: 27 seconds (ref 24–36)

## 2016-09-04 LAB — CALCIUM, IONIZED: Calcium, Ionized, Serum: 5.9 mg/dL — ABNORMAL HIGH (ref 4.5–5.6)

## 2016-09-04 SURGERY — THORACIC LAMINECTOMY FOR TUMOR
Anesthesia: General | Site: Spine Thoracic

## 2016-09-04 MED ORDER — BUPIVACAINE LIPOSOME 1.3 % IJ SUSP
20.0000 mL | INTRAMUSCULAR | Status: DC
  Filled 2016-09-04: qty 20

## 2016-09-04 MED ORDER — POLYETHYLENE GLYCOL 3350 17 G PO PACK
17.0000 g | PACK | Freq: Two times a day (BID) | ORAL | Status: DC
  Administered 2016-09-04 – 2016-09-06 (×4): 17 g via ORAL
  Filled 2016-09-04 (×4): qty 1

## 2016-09-04 MED ORDER — THROMBIN 20000 UNITS EX SOLR
CUTANEOUS | Status: AC
  Filled 2016-09-04: qty 20000

## 2016-09-04 MED ORDER — ROCURONIUM BROMIDE 100 MG/10ML IV SOLN
INTRAVENOUS | Status: DC | PRN
  Administered 2016-09-04: 50 mg via INTRAVENOUS
  Administered 2016-09-04: 20 mg via INTRAVENOUS

## 2016-09-04 MED ORDER — MIDAZOLAM HCL 2 MG/2ML IJ SOLN
INTRAMUSCULAR | Status: AC
  Filled 2016-09-04: qty 2

## 2016-09-04 MED ORDER — BACITRACIN 50000 UNITS IM SOLR
INTRAMUSCULAR | Status: DC | PRN
  Administered 2016-09-04: 500 mL

## 2016-09-04 MED ORDER — ONDANSETRON HCL 4 MG/2ML IJ SOLN
INTRAMUSCULAR | Status: DC | PRN
  Administered 2016-09-04: 4 mg via INTRAVENOUS

## 2016-09-04 MED ORDER — ESMOLOL HCL 100 MG/10ML IV SOLN
INTRAVENOUS | Status: AC
  Filled 2016-09-04: qty 10

## 2016-09-04 MED ORDER — FENTANYL CITRATE (PF) 100 MCG/2ML IJ SOLN
INTRAMUSCULAR | Status: DC | PRN
  Administered 2016-09-04: 50 ug via INTRAVENOUS
  Administered 2016-09-04: 150 ug via INTRAVENOUS
  Administered 2016-09-04 (×3): 50 ug via INTRAVENOUS

## 2016-09-04 MED ORDER — PROPOFOL 10 MG/ML IV BOLUS
INTRAVENOUS | Status: AC
  Filled 2016-09-04: qty 20

## 2016-09-04 MED ORDER — SODIUM CHLORIDE 0.9 % IJ SOLN
INTRAMUSCULAR | Status: AC
  Filled 2016-09-04: qty 20

## 2016-09-04 MED ORDER — PROPOFOL 10 MG/ML IV BOLUS
INTRAVENOUS | Status: DC | PRN
  Administered 2016-09-04: 150 mg via INTRAVENOUS

## 2016-09-04 MED ORDER — LACTATED RINGERS IV SOLN
INTRAVENOUS | Status: DC
  Administered 2016-09-04 (×3): via INTRAVENOUS

## 2016-09-04 MED ORDER — LABETALOL HCL 5 MG/ML IV SOLN
INTRAVENOUS | Status: AC
  Filled 2016-09-04: qty 4

## 2016-09-04 MED ORDER — CEFAZOLIN SODIUM-DEXTROSE 2-3 GM-% IV SOLR
INTRAVENOUS | Status: DC | PRN
  Administered 2016-09-04: 2 g via INTRAVENOUS

## 2016-09-04 MED ORDER — MORPHINE SULFATE (PF) 2 MG/ML IV SOLN
1.0000 mg | INTRAVENOUS | Status: DC | PRN
  Administered 2016-09-04 – 2016-09-05 (×4): 1 mg via INTRAVENOUS
  Filled 2016-09-04 (×4): qty 1

## 2016-09-04 MED ORDER — DEXAMETHASONE SODIUM PHOSPHATE 10 MG/ML IJ SOLN
INTRAMUSCULAR | Status: DC | PRN
  Administered 2016-09-04: 10 mg via INTRAVENOUS

## 2016-09-04 MED ORDER — THROMBIN 20000 UNITS EX SOLR
CUTANEOUS | Status: DC | PRN
  Administered 2016-09-04: 15:00:00 via TOPICAL

## 2016-09-04 MED ORDER — ESMOLOL HCL 100 MG/10ML IV SOLN
INTRAVENOUS | Status: DC | PRN
  Administered 2016-09-04 (×2): 50 mg via INTRAVENOUS

## 2016-09-04 MED ORDER — SENNOSIDES-DOCUSATE SODIUM 8.6-50 MG PO TABS
1.0000 | ORAL_TABLET | Freq: Two times a day (BID) | ORAL | Status: DC
  Administered 2016-09-04 – 2016-09-06 (×4): 1 via ORAL
  Filled 2016-09-04 (×4): qty 1

## 2016-09-04 MED ORDER — THROMBIN 5000 UNITS EX SOLR
CUTANEOUS | Status: AC
  Filled 2016-09-04: qty 5000

## 2016-09-04 MED ORDER — SUGAMMADEX SODIUM 200 MG/2ML IV SOLN
INTRAVENOUS | Status: DC | PRN
  Administered 2016-09-04: 200 mg via INTRAVENOUS

## 2016-09-04 MED ORDER — 0.9 % SODIUM CHLORIDE (POUR BTL) OPTIME
TOPICAL | Status: DC | PRN
  Administered 2016-09-04: 1000 mL

## 2016-09-04 MED ORDER — PHENYLEPHRINE 40 MCG/ML (10ML) SYRINGE FOR IV PUSH (FOR BLOOD PRESSURE SUPPORT)
PREFILLED_SYRINGE | INTRAVENOUS | Status: AC
  Filled 2016-09-04: qty 10

## 2016-09-04 MED ORDER — MIDAZOLAM HCL 5 MG/5ML IJ SOLN
INTRAMUSCULAR | Status: DC | PRN
  Administered 2016-09-04: 2 mg via INTRAVENOUS

## 2016-09-04 MED ORDER — LIDOCAINE-EPINEPHRINE 2 %-1:100000 IJ SOLN
INTRAMUSCULAR | Status: AC
  Filled 2016-09-04: qty 1

## 2016-09-04 MED ORDER — SODIUM CHLORIDE 0.9 % IJ SOLN
INTRAMUSCULAR | Status: AC
  Filled 2016-09-04: qty 10

## 2016-09-04 MED ORDER — LIDOCAINE HCL (CARDIAC) 20 MG/ML IV SOLN
INTRAVENOUS | Status: DC | PRN
  Administered 2016-09-04: 60 mg via INTRAVENOUS

## 2016-09-04 MED ORDER — PANTOPRAZOLE SODIUM 40 MG IV SOLR
40.0000 mg | Freq: Every day | INTRAVENOUS | Status: DC
  Administered 2016-09-05: 40 mg via INTRAVENOUS
  Filled 2016-09-04: qty 40

## 2016-09-04 MED ORDER — ALBUMIN HUMAN 5 % IV SOLN
INTRAVENOUS | Status: DC | PRN
  Administered 2016-09-04: 15:00:00 via INTRAVENOUS

## 2016-09-04 MED ORDER — BUPIVACAINE HCL (PF) 0.25 % IJ SOLN
INTRAMUSCULAR | Status: DC | PRN
  Administered 2016-09-04: 7.5 mL

## 2016-09-04 MED ORDER — FENTANYL CITRATE (PF) 250 MCG/5ML IJ SOLN
INTRAMUSCULAR | Status: AC
  Filled 2016-09-04: qty 5

## 2016-09-04 MED ORDER — BUPIVACAINE HCL (PF) 0.25 % IJ SOLN
INTRAMUSCULAR | Status: AC
  Filled 2016-09-04: qty 30

## 2016-09-04 MED ORDER — ACETAMINOPHEN 10 MG/ML IV SOLN
INTRAVENOUS | Status: AC
  Filled 2016-09-04: qty 100

## 2016-09-04 MED ORDER — SODIUM CHLORIDE 0.9 % IV SOLN
INTRAVENOUS | Status: DC | PRN
  Administered 2016-09-04: 15:00:00 via INTRAVENOUS

## 2016-09-04 MED ORDER — HYDROMORPHONE HCL 1 MG/ML IJ SOLN
INTRAMUSCULAR | Status: AC
  Filled 2016-09-04: qty 1

## 2016-09-04 MED ORDER — ACETAMINOPHEN 10 MG/ML IV SOLN
INTRAVENOUS | Status: DC | PRN
  Administered 2016-09-04: 1000 mg via INTRAVENOUS

## 2016-09-04 MED ORDER — SODIUM CHLORIDE 0.9 % IV SOLN: Freq: Once | INTRAVENOUS | Status: DC

## 2016-09-04 MED ORDER — LIDOCAINE-EPINEPHRINE 2 %-1:100000 IJ SOLN
INTRAMUSCULAR | Status: DC | PRN
  Administered 2016-09-04: 7.5 mL

## 2016-09-04 MED ORDER — LABETALOL HCL 5 MG/ML IV SOLN
INTRAVENOUS | Status: DC | PRN
  Administered 2016-09-04 (×2): 5 mg via INTRAVENOUS

## 2016-09-04 MED ORDER — HYDROMORPHONE HCL 1 MG/ML IJ SOLN
0.2500 mg | INTRAMUSCULAR | Status: DC | PRN
  Administered 2016-09-04: 0.25 mg via INTRAVENOUS

## 2016-09-04 MED ORDER — PHENYLEPHRINE HCL 10 MG/ML IJ SOLN
INTRAVENOUS | Status: DC | PRN
  Administered 2016-09-04: 25 ug/min via INTRAVENOUS

## 2016-09-04 MED ORDER — SODIUM CHLORIDE 0.9 % IV SOLN: INTRAVENOUS | Status: DC | PRN

## 2016-09-04 MED ORDER — THROMBIN 5000 UNITS EX SOLR
CUTANEOUS | Status: DC | PRN
  Administered 2016-09-04 (×2): via TOPICAL

## 2016-09-04 SURGICAL SUPPLY — 93 items
ADH SKN CLS APL DERMABOND .7 (GAUZE/BANDAGES/DRESSINGS) ×1
APL SKNCLS STERI-STRIP NONHPOA (GAUZE/BANDAGES/DRESSINGS)
APL SRG 60D 8 XTD TIP BNDBL (TIP)
BAG DECANTER FOR FLEXI CONT (MISCELLANEOUS) ×2 IMPLANT
BENZOIN TINCTURE PRP APPL 2/3 (GAUZE/BANDAGES/DRESSINGS) IMPLANT
BLADE SURG 11 STRL SS (BLADE) ×2 IMPLANT
BLADE ULTRA TIP 2M (BLADE) IMPLANT
BUR MATCHSTICK NEURO 3.0 LAGG (BURR) ×2 IMPLANT
CANISTER SUCT 3000ML PPV (MISCELLANEOUS) ×2 IMPLANT
CARTRIDGE OIL MAESTRO DRILL (MISCELLANEOUS) ×1 IMPLANT
CLIP VESOCCLUDE MED 6/CT (CLIP) IMPLANT
COVER MAYO STAND STRL (DRAPES) IMPLANT
DERMABOND ADVANCED (GAUZE/BANDAGES/DRESSINGS) ×1
DERMABOND ADVANCED .7 DNX12 (GAUZE/BANDAGES/DRESSINGS) IMPLANT
DIFFUSER DRILL AIR PNEUMATIC (MISCELLANEOUS) ×2 IMPLANT
DRAPE LAPAROTOMY 100X72 PEDS (DRAPES) IMPLANT
DRAPE LAPAROTOMY 100X72X124 (DRAPES) IMPLANT
DRAPE MICROSCOPE LEICA (MISCELLANEOUS) IMPLANT
DRAPE POUCH INSTRU U-SHP 10X18 (DRAPES) ×2 IMPLANT
DRAPE SHEET LG 3/4 BI-LAMINATE (DRAPES) ×2 IMPLANT
DRSG OPSITE 4X5.5 SM (GAUZE/BANDAGES/DRESSINGS) ×1 IMPLANT
DRSG OPSITE POSTOP 4X8 (GAUZE/BANDAGES/DRESSINGS) ×1 IMPLANT
DURASEAL APPLICATOR TIP (TIP) IMPLANT
DURASEAL SPINE SEALANT 3ML (MISCELLANEOUS) IMPLANT
ELECT REM PT RETURN 9FT ADLT (ELECTROSURGICAL) ×2
ELECTRODE REM PT RTRN 9FT ADLT (ELECTROSURGICAL) ×1 IMPLANT
GAUZE SPONGE 4X4 12PLY STRL (GAUZE/BANDAGES/DRESSINGS) IMPLANT
GAUZE SPONGE 4X4 16PLY XRAY LF (GAUZE/BANDAGES/DRESSINGS) IMPLANT
GLOVE BIO SURGEON STRL SZ7 (GLOVE) IMPLANT
GLOVE BIOGEL PI IND STRL 7.0 (GLOVE) IMPLANT
GLOVE BIOGEL PI IND STRL 7.5 (GLOVE) ×1 IMPLANT
GLOVE BIOGEL PI INDICATOR 7.0 (GLOVE)
GLOVE BIOGEL PI INDICATOR 7.5 (GLOVE) ×1
GLOVE EXAM NITRILE LRG STRL (GLOVE) IMPLANT
GLOVE EXAM NITRILE XL STR (GLOVE) IMPLANT
GLOVE EXAM NITRILE XS STR PU (GLOVE) IMPLANT
GLOVE SS BIOGEL STRL SZ 7.5 (GLOVE) ×1 IMPLANT
GLOVE SUPERSENSE BIOGEL SZ 7.5 (GLOVE) ×1
GOWN STRL REUS W/ TWL LRG LVL3 (GOWN DISPOSABLE) IMPLANT
GOWN STRL REUS W/ TWL XL LVL3 (GOWN DISPOSABLE) IMPLANT
GOWN STRL REUS W/TWL 2XL LVL3 (GOWN DISPOSABLE) IMPLANT
GOWN STRL REUS W/TWL LRG LVL3 (GOWN DISPOSABLE)
GOWN STRL REUS W/TWL XL LVL3 (GOWN DISPOSABLE)
HEMOSTAT POWDER KIT SURGIFOAM (HEMOSTASIS) ×3 IMPLANT
HEMOSTAT SURGICEL 2X14 (HEMOSTASIS) IMPLANT
KIT BASIN OR (CUSTOM PROCEDURE TRAY) ×2 IMPLANT
KIT ROOM TURNOVER OR (KITS) ×2 IMPLANT
KNIFE ARACHNOID DISP AM-24-S (MISCELLANEOUS) ×2 IMPLANT
NDL HYPO 21X1.5 SAFETY (NEEDLE) ×1 IMPLANT
NDL SPNL 18GX3.5 QUINCKE PK (NEEDLE) IMPLANT
NDL SPNL 22GX3.5 QUINCKE BK (NEEDLE) IMPLANT
NEEDLE HYPO 21X1.5 SAFETY (NEEDLE) ×2 IMPLANT
NEEDLE SPNL 18GX3.5 QUINCKE PK (NEEDLE) IMPLANT
NEEDLE SPNL 22GX3.5 QUINCKE BK (NEEDLE) IMPLANT
NS IRRIG 1000ML POUR BTL (IV SOLUTION) ×2 IMPLANT
OIL CARTRIDGE MAESTRO DRILL (MISCELLANEOUS) ×2
PACK LAMINECTOMY NEURO (CUSTOM PROCEDURE TRAY) ×2 IMPLANT
PACK UNIVERSAL I (CUSTOM PROCEDURE TRAY) ×2 IMPLANT
PAD ARMBOARD 7.5X6 YLW CONV (MISCELLANEOUS) ×6 IMPLANT
PATTIES SURGICAL .25X.25 (GAUZE/BANDAGES/DRESSINGS) IMPLANT
PATTIES SURGICAL .5 X3 (DISPOSABLE) ×2 IMPLANT
PATTIES SURGICAL .5X1.5 (GAUZE/BANDAGES/DRESSINGS) IMPLANT
PATTIES SURGICAL 1/4 X 3 (GAUZE/BANDAGES/DRESSINGS) ×2 IMPLANT
RUBBERBAND STERILE (MISCELLANEOUS) IMPLANT
SEALER BIPOLAR AQUA 2.3 (INSTRUMENTS) ×1 IMPLANT
SPECIMEN JAR SMALL (MISCELLANEOUS) IMPLANT
SPONGE LAP 4X18 X RAY DECT (DISPOSABLE) IMPLANT
SPONGE NEURO XRAY DETECT 1X3 (DISPOSABLE) IMPLANT
SPONGE SURGIFOAM ABS GEL 100 (HEMOSTASIS) ×2 IMPLANT
STAPLER VISISTAT 35W (STAPLE) ×1 IMPLANT
STRIP CLOSURE SKIN 1/4X4 (GAUZE/BANDAGES/DRESSINGS) IMPLANT
STRIP SURGICAL 1 X 6 IN (GAUZE/BANDAGES/DRESSINGS) IMPLANT
STRIP SURGICAL 1/2 X 6 IN (GAUZE/BANDAGES/DRESSINGS) IMPLANT
STRIP SURGICAL 1/4 X 6 IN (GAUZE/BANDAGES/DRESSINGS) IMPLANT
STRIP SURGICAL 3/4 X 6 IN (GAUZE/BANDAGES/DRESSINGS) IMPLANT
SUT NURALON 4 0 TR CR/8 (SUTURE) ×4 IMPLANT
SUT PROLENE 5 0 C1 (SUTURE) ×4 IMPLANT
SUT STRATAFIX 1PDS 45CM VIOLET (SUTURE) ×1 IMPLANT
SUT STRATAFIX MNCRL+ 3-0 PS-2 (SUTURE) ×1
SUT STRATAFIX MONOCRYL 3-0 (SUTURE) ×1
SUT STRATAFIX SPIRAL + 2-0 (SUTURE) ×1 IMPLANT
SUT VIC AB 0 CT1 18XCR BRD8 (SUTURE) ×1 IMPLANT
SUT VIC AB 0 CT1 8-18 (SUTURE) ×2
SUT VIC AB 2-0 CT1 18 (SUTURE) ×2 IMPLANT
SUT VIC AB 3-0 SH 8-18 (SUTURE) ×2 IMPLANT
SUT VIC AB 4-0 PS2 27 (SUTURE) ×2 IMPLANT
SUTURE STRATFX MNCRL+ 3-0 PS-2 (SUTURE) IMPLANT
SYR 30ML LL (SYRINGE) ×2 IMPLANT
SYR CONTROL 10ML LL (SYRINGE) ×4 IMPLANT
TOWEL GREEN STERILE (TOWEL DISPOSABLE) ×2 IMPLANT
TOWEL GREEN STERILE FF (TOWEL DISPOSABLE) ×2 IMPLANT
TRAY FOLEY W/METER SILVER 16FR (SET/KITS/TRAYS/PACK) IMPLANT
WATER STERILE IRR 1000ML POUR (IV SOLUTION) ×2 IMPLANT

## 2016-09-04 NOTE — Anesthesia Procedure Notes (Signed)
Procedure Name: Intubation Date/Time: 09/04/2016 2:00 PM Performed by: Lavell Luster Pre-anesthesia Checklist: Patient identified, Emergency Drugs available, Suction available, Patient being monitored and Timeout performed Patient Re-evaluated:Patient Re-evaluated prior to induction Oxygen Delivery Method: Circle system utilized Preoxygenation: Pre-oxygenation with 100% oxygen Induction Type: IV induction Ventilation: Mask ventilation without difficulty Laryngoscope Size: Mac and 3 Grade View: Grade I Tube type: Oral Tube size: 7.5 mm Number of attempts: 1 Airway Equipment and Method: Stylet Placement Confirmation: ETT inserted through vocal cords under direct vision,  positive ETCO2 and breath sounds checked- equal and bilateral Secured at: 22 cm Tube secured with: Tape Dental Injury: Teeth and Oropharynx as per pre-operative assessment

## 2016-09-04 NOTE — Op Note (Signed)
09/02/2016 - 09/04/2016  3:26 PM  PATIENT:  Alexander Saunders  66 y.o. male  PRE-OPERATIVE DIAGNOSIS:  Thoracic epidural mass, T4-T6. Spinal cord compression.  POST-OPERATIVE DIAGNOSIS:  Same  PROCEDURE:  T4-T6 laminectomy for resection of extradural  SURGEON:  Aldean Ast, MD  ASSISTANTS: Erline Levine, M.D.  ANESTHESIA:   General  DRAINS: Medium Hemovac   SPECIMEN:  T5 spinous process; thoracic epidural mass  INDICATION FOR PROCEDURE: 66 year old man with signs and symptoms of spinal cord compression was found to have multiple spinal metastases as well as a large thoracic epidural mass from T4-T6. I recommended the above operation. Patient understood the risks, benefits, and alternatives and potential outcomes and wished to proceed.  PROCEDURE DETAILS: Prior to coming to the operating room the patient was taken to interventional radiology where a skin marker was placed at approximately the T5 pedicle. After smooth induction of general endotracheal anesthesia the patient was turned prone on a Wilson frame. The skin was prepped and draped in the usual sterile fashion.  The skin of the planned operative site was injected with lidocaine and Marcaine with epinephrine.  A midline incision was made based on the preoperative marks. Monopolar cautery was used to perform soft tissue dissection to the spinous processes. Subperiosteal dissection was then performed to expose the spinous processes and lamina over 3 spinal levels. The inferior most level showed clear bone involvement with tumor. This was certainly T5. I extended my incision down 1 level to expose T6. I resected the T5 spinous process and sent it for specimen. It was clearly abnormal. I then used the high-speed drill and Kerrison rongeurs to carefully perform a laminectomy at T4. I encountered normal dura above the epidural mass. I then continued to use the Kerrison rongeur to remove bone to expose the epidural space. I continued this  down to the midpoint of the lamina of T6. At this point I was below tumor. I separated the epidural mass from the thecal sac gently with a 1 Technical brewer. I then resected the epidural mass with a Kerrison rongeur and sent for both frozen and permanent analysis. Frozen analysis revealed that it was lesional tissue.  After resection of the epidural mass I obtained hemostasis in the lateral gutters. I irrigated vigorously with bacitracin saline. I was pleased that there was excellent hemostasis. A medium Hemovac drain was placed below the fascia. The incision was then closed in routine anatomic layers with running strata fix suture. The skin was closed with a running Monocryl subcuticular strata fix suture and sealed with Dermabond. The patient was turned to the prone position and awoke without incident.  PATIENT DISPOSITION:  PACU - hemodynamically stable.   Delay start of Pharmacological VTE agent (>24hrs) due to surgical blood loss or risk of bleeding:  yes

## 2016-09-04 NOTE — Anesthesia Procedure Notes (Signed)
Arterial Line Insertion Performed by: attending  Preanesthetic checklist: patient identified, IV checked, site marked, risks and benefits discussed, surgical consent, monitors and equipment checked, pre-op evaluation and timeout performed Lidocaine 1% used for infiltration and patient sedated Left, radial was placed Catheter size: 20 G Hand hygiene performed  and Seldinger technique used Allen's test indicative of satisfactory collateral circulation Attempts: 1 Additional procedure comments: Performed by Dr Cyndy Freeze in [place] after induction. [name]

## 2016-09-04 NOTE — Transfer of Care (Signed)
Immediate Anesthesia Transfer of Care Note  Patient: Alexander Saunders  Procedure(s) Performed: Procedure(s): THORACIC FOUR-SIX LAMINECTOMY FOR RESECTION OF TUMOR (N/A)  Patient Location: PACU  Anesthesia Type:General  Level of Consciousness: awake, alert  and patient cooperative  Airway & Oxygen Therapy: Patient Spontanous Breathing  Post-op Assessment: Report given to RN and Post -op Vital signs reviewed and stable  Post vital signs: Reviewed and stable  Last Vitals:  Vitals:   09/03/16 2120 09/04/16 0449  BP: (!) 120/54 130/64  Pulse: 83 (!) 59  Resp: 17 17  Temp: 36.8 C (!) 36.4 C    Last Pain:  Vitals:   09/04/16 0900  TempSrc:   PainSc: 8       Patients Stated Pain Goal: 2 (56/25/63 8937)  Complications: No apparent anesthesia complications

## 2016-09-04 NOTE — Anesthesia Preprocedure Evaluation (Addendum)
Anesthesia Evaluation  Patient identified by MRN, date of birth, ID band Patient awake    Reviewed: Allergy & Precautions, H&P , NPO status , Patient's Chart, lab work & pertinent test results  Airway Mallampati: I  TM Distance: >3 FB Neck ROM: Full    Dental no notable dental hx. (+) Teeth Intact, Dental Advisory Given   Pulmonary neg pulmonary ROS,    Pulmonary exam normal breath sounds clear to auscultation       Cardiovascular negative cardio ROS   Rhythm:Regular Rate:Normal     Neuro/Psych negative neurological ROS  negative psych ROS   GI/Hepatic negative GI ROS, Neg liver ROS,   Endo/Other  negative endocrine ROS  Renal/GU negative Renal ROS  negative genitourinary   Musculoskeletal   Abdominal   Peds  Hematology negative hematology ROS (+)   Anesthesia Other Findings   Reproductive/Obstetrics negative OB ROS                            Anesthesia Physical Anesthesia Plan  ASA: II  Anesthesia Plan: General   Post-op Pain Management:    Induction: Intravenous  PONV Risk Score and Plan: 3 and Ondansetron, Dexamethasone and Midazolam  Airway Management Planned: Oral ETT  Additional Equipment:   Intra-op Plan:   Post-operative Plan: Extubation in OR  Informed Consent: I have reviewed the patients History and Physical, chart, labs and discussed the procedure including the risks, benefits and alternatives for the proposed anesthesia with the patient or authorized representative who has indicated his/her understanding and acceptance.   Dental advisory given  Plan Discussed with: CRNA  Anesthesia Plan Comments:        Anesthesia Quick Evaluation

## 2016-09-04 NOTE — Progress Notes (Signed)
PROGRESS NOTE    Alexander Saunders  ERX:[id number] DOB: [birth year] DOA: 09/02/2016 PCP: Benito Mccreedy, MD   Brief Narrative:  Alexander Saunders is a 66 y.o. male without a significant past medical history who presented with complaints of lower extremity weakness. For the last 8 weeks patient has complained of pain on his left mid-lower back that would radiate to the side of his chest /pectoral muscle. Pain continued intermittently become sharp as he notes with coughing. He is also reported progressively worsening weakness on the right leg worse than the left and reports numbness and tingling sensation. The patient is very active at baseline and works out on a daily basis. He also reports weight loss of approximately 8 pounds. He has been taking ibuprofen 800 mg since the onset of symptoms with only mild relief. It seems he initially attributed pain to muscle strain. His wife reports that he went hiking a few weeks ago but he does report being bitten by any ticks. However, last night patient had gotten up to use the bathroom and had fallen due to the right leg weakness. Since that time he also reports that he has not been able to urinate and has had some constipation. Foley catheter was placed for urinary retention.  Neurosurgery was consulted and saw the patient for a mass of thoracic spine found on MRI. Patient underwent CT of Chest/Abd/Pelvis for further Metastatic workup which showed no abnormality in the chest, abdomen but had had multiple lucencies noted throughout the spine and pelvis concerning for metastatic disease or multiple myeloma. The largest lucency measured 2.8 cm in the posterior aspect of the right acetabulum.  Patient to undergo T4-T6 Laminectomy this afternoon. Case was discussed with Dr. Alen Blew of Oncology yesterday and he states will need a biopsy to confirm diagnosis before Oncology gets involved. Patient feels as if his legs are getting stronger but still has intermittent numbness  and tingling.   Assessment & Plan:   Principal Problem:   Epidural mass Active Problems:   Lower extremity weakness   Acute kidney injury (HCC)   Urinary retention   Leukocytosis   Hypercalcemia   Spinal cord lesion (HCC)  Lower Extremity Weakness 2/2 Epidural Mass with Cord Compression concern for Metastatic Disease vs Multiple Myeloma, improving  -Acute. Patient presents with a week history of progressively worsening back pain and lower extremity weakness. MRI reveals epidural mass with signs of metastatic disease or multiple myeloma likely causing symptoms. Patient's last colonoscopy in 2010. - Admitted to a [facility] - Discussed with PCP Dr. Scherrie Merritts he stated last PSA results were 12/25/15 and that his PSA level was 1.6 - Continue IV Dexamethasone 4 mg IV q6h; Added Pantoprazole 40 mg po Daily and changed to IV Pantoprazole as patient is NPO for T4-T6 Laminectomy - Hydrocodone-Acetaminophen 1 tab po q4hprn for Moderate Pain and C/w Senna 8.6 mg po Daily - Check TSH was 0.940, ESR (20), CRP (<0.8), SPEP pending - btained CT Chest/Abd/Pelvis w/o Contrast for further metastatic workup and showed Multiple lucencies are noted throughout the spine and pelvis concerning for metastatic disease or multiple myeloma. The largest measures 2.8 cm in the posterior aspect of the right acetabulum. No other abnormality seen in the chest, abdomen or pelvis. - Discussed with Dr. Alen Blew of Oncology and recommends no further workup at this time as patient is going for T4-T6 Laminectomy for Tumor Resection done by Dr. [name] Ditty; Will obtain Biopsy then  - Physical Therapy to Eval and Treat - Appreciate  neurosurgery consultative services will follow-up for further recommendations; Patient to undergo T4-T6 Laminectomy today -Neurosurgery ordering CT T Spine Limited w/o or w Contrast  Acute kidney injury with Urinary Retention, improving -Baseline creatinine unknown at this time but presents with  creatinine 1.6 and BUN 26.  -BUN/Cr went from 26/1.60 -> 19/1.34 -> 18/1.14 -> 16/1.02 -Foley catheter placed with 486m urine out. Urinalysis otherwise negative.  -Suspect acute kidney injury likely related to recent use of NSAIDs along with urinary retention. - Continue Foley catheter - IV fluids of NS at 75 ml/hr - Check urine FENa (Urine Lytes still pending) - Checked Renal Ultrasound and was Negative Exam - Discontinued nephrotoxic agents including ibuprofen - Continue to Monitor and repeat CMP in AM  Leukocytosis - WBC 10.6 on admission. WBC went from 10.6 -> 9.9 -> 12.4 -> 14.6 - Suspect likely reactive to above in conjunction with IV Steroid Demargination now - Urinalysis negative for any signs of infection. - Continue to Monitor and repeat CBC in AM  Normocytic Normochromic Anemia  - After Discussion with patient's PCP, PCP was concerned about Anemia and wanted to pursue Further workup for Cancer and had ordered a CT Scan; [facility] - Hemoglobin 11 on admission with no previous baseline available - Hb/Hct went from 11.0/31.7 -> 10.8/31.8 -> 10.9/31.8 -> 10.6/31.1 - Continue to Monitor for S/Sx of Infection and Repeat CBC in AM  Constipation, Chronic  -Patient Admitted to having Chronic Constipation and that last bowel movement was 7 days ago -Changed Senna to Senna-Docusate 1 tab BID and IVF Rehydration -Will add Polyethylene Glycol BID  Hypercalcemia, improved  - ? Hypercalcemia of Malignancy - Acute. Initial calcium 10.7 and improved to 10.0. - IV fluids as seen above - Check Ionized Calcium and was slightly elevated at 5.9  DVT prophylaxis: SCDs Code Status: FULL CODE Family Communication: Discussed with wife at bedside Disposition Plan: Remain Inpatient for T4-T6 Laminectomy today  Consultants:   Neurosurgery Dr. BMarland KitchenDitty  Discussed Case with Oncology Dr. FZola Button  Procedures: T4-T6 Laminectomy scheduled for AM   Antimicrobials:  Anti-infectives    Start     Dose/Rate Route Frequency Ordered Stop   09/05/16 0600  ceFAZolin (ANCEF) IVPB 2g/100 mL premix  Status:  Discontinued     2 g 200 mL/hr over 30 Minutes Intravenous On call to O.R. 09/03/16 1600 09/03/16 1602   09/04/16 0600  ceFAZolin (ANCEF) IVPB 2g/100 mL premix     2 g 200 mL/hr over 30 Minutes Intravenous To Surgery 09/03/16 1602 09/04/16 1122     Subjective: Seen and examined and stated he felt stronger. No nausea or vomiting but did not sleep last night. Was concerned that he had "metastatic" disease. No CP or SOB. Had a lot of questions about the procedure today. No other concerns or complaints but was in good spirits.   Objective: Vitals:   09/03/16 0533 09/03/16 1434 09/03/16 2120 09/04/16 0449  BP: 129/63 136/62 (!) 120/54 130/64  Pulse: 77 69 83 (!) 59  Resp: _0 Temp: 97.8 F (36.6 C) 98.2 F (36.8 C) 98.2 F (36.8 C) (!) 97.5 F (36.4 C)  TempSrc: Oral Oral    SpO2: 98% 99% 97% 100%  Weight:      Height:        Intake/Output Summary (Last 24 hours) at 09/04/16 1153 Last data filed at 09/04/16 04825 Gross  per 24 hour  Intake           1687.5 ml  Output             3000 ml  Net          -1312.5 ml   Filed Weights   09/02/16 1033 09/02/16 2356  Weight: 87.1 kg (192 lb) 89.5 kg (197 lb 6.4 oz)   Examination: Physical Exam:  Constitutional: WN/WD AAM in NAD appears calm and comfortable  Eyes: Sclerae anicteric; conjunctivae non-injected  ENMT: Grossly normal hearing. Mucous membranes appear moist Neck: Supple with No JVD Respiratory: CTAB; No wheezing/rales/rhonchi; Patient not tachypenic or using any Accessory muscles to breathe Cardiovascular: RRR; S1 S2; No m/r/g; no Lower Extremity edema Abdomen: Soft, NT, ND. Bowel sounds present GU: Deferred. Foley catheter is in place Musculoskeletal: No contractures; No cyanosis Skin: Warm and  dry, no rashes or lesions on a limited skin evaluation Neurologic: CN 2-12 grossly intact. Strength 4/5 in Right LE Psychiatric: Normal mood and affect. Intact judgement and insight   Data Reviewed: I have personally reviewed following labs and imaging studies  CBC:  Recent Labs Lab 09/02/16 1208 09/02/16 2359 09/03/16 0913 09/04/16 0419  WBC 10.6* 9.9 12.4* 14.6*  NEUTROABS 7.1  --  11.3* 13.0*  HGB 11.0* 10.8* 10.9* 10.6*  HCT 31.7* 31.8* 31.8* 31.1*  MCV 89.8 92.2 91.1 91.5  PLT 321 337 350 740   Basic Metabolic Panel:  Recent Labs Lab 09/02/16 1208 09/02/16 2359 09/03/16 0913 09/04/16 0419  NA 140 138 137 140  K 4.0 4.0 3.9 3.9  CL 107 104 105 110  CO2 _0 GLUCOSE 101* 115* 148* 120*  BUN 26* _1 CREATININE 1.60* 1.34* 1.14 1.02  CALCIUM 10.7* 10.5* 10.3 10.0  MG  --   --  1.8 1.8  PHOS  --   --  3.8 3.2   GFR: Estimated Creatinine Clearance: 77.1 mL/min (by C-G formula based on SCr of 1.02 mg/dL). Liver Function Tests:  Recent Labs Lab 09/02/16 1208 09/03/16 0913 09/04/16 0419  AST _2 ALT 18 16* 22  ALKPHOS 30* 28* 26*  BILITOT 0.8 0.7 0.4  PROT 7.5 6.8 6.4*  ALBUMIN 4.6 4.1 3.7   No results for input(s): LIPASE, AMYLASE in the last 168 hours. No results for input(s): AMMONIA in the last 168 hours. Coagulation Profile:  Recent Labs Lab 09/04/16 0952  INR 0.99   Cardiac Enzymes: No results for input(s): CKTOTAL, CKMB, CKMBINDEX, TROPONINI in the last 168 hours. BNP (last 3 results) No results for input(s): PROBNP in the last 8760 hours. HbA1C: No results for input(s): HGBA1C in the last 72 hours. CBG: No results for input(s): GLUCAP in the last 168 hours. Lipid Profile: No results for input(s): CHOL, HDL, LDLCALC, TRIG, CHOLHDL, LDLDIRECT in the last 72 hours. Thyroid Function Tests:  Recent Labs  09/02/16 2359  TSH 0.940   Anemia Panel: No results for input(s): VITAMINB12, FOLATE, FERRITIN, TIBC, IRON,  RETICCTPCT in the last 72 hours. Sepsis Labs: No results for input(s): PROCALCITON, LATICACIDVEN in the last 168 hours.  Recent Results (from the past 240 hour(s))  Surgical pcr screen     Status: None   Collection Time: 09/04/16 12:17 AM  Result Value Ref Range Status   MRSA, PCR NEGATIVE NEGATIVE Final   Staphylococcus aureus NEGATIVE NEGATIVE Final    Comment:        The Xpert SA Assay (FDA approved  for NASAL specimens in patients over 61 years of age), is one component of a comprehensive surveillance program.  Test performance has been validated by [facility] for patients greater than or equal to 78 year old. It is not intended to diagnose infection nor to guide or monitor treatment.      Radiology Studies: Ct Abdomen Pelvis Wo Contrast  Result Date: 09/03/2016 CLINICAL DATA:  Metastatic disease. EXAM: CT CHEST, ABDOMEN AND PELVIS WITHOUT CONTRAST TECHNIQUE: Multidetector CT imaging of the chest, abdomen and pelvis was performed following the standard protocol without IV contrast. COMPARISON:  Ultrasound of same day.  MRI of September 02, 2016. FINDINGS: CT CHEST FINDINGS Cardiovascular: No significant vascular findings. Normal heart size. No pericardial effusion. Mediastinum/Nodes: No enlarged mediastinal, hilar, or axillary lymph nodes. Thyroid gland, trachea, and esophagus demonstrate no significant findings. Lungs/Pleura: Lungs are clear. No pleural effusion or pneumothorax. Musculoskeletal: There is noted lucency involving the body and posterior elements of T5 consistent with malignancy. Multiple other smaller lucencies are noted throughout the thoracic spine, also concerning for metastatic disease or multiple myeloma. Lucency is also noted within the manubrium suggesting lesion as well. CT ABDOMEN PELVIS FINDINGS Hepatobiliary: No focal liver abnormality is seen. No gallstones, gallbladder wall thickening, or biliary dilatation. Pancreas: Unremarkable. No pancreatic ductal dilatation  or surrounding inflammatory changes. Spleen: Normal in size without focal abnormality. Adrenals/Urinary Tract: Adrenal glands appear normal. No hydronephrosis or renal obstruction is noted. No renal or ureteral calculi are noted. Foley catheter is noted within the urinary bladder. Stomach/Bowel: The stomach appears normal. There is no evidence of bowel obstruction. Stool is noted throughout the colon. The appendix is not visualized. Vascular/Lymphatic: No significant vascular findings are present. No enlarged abdominal or pelvic lymph nodes. Reproductive: Prostate is unremarkable. Other: No abdominal wall hernia or abnormality. No abdominopelvic ascites. Musculoskeletal: Multiple lucencies are noted throughout the spine and pelvis with the largest measuring 2.8 cm involving the posterior aspect of the right acetabulum. These are concerning for multiple myeloma or metastatic disease. IMPRESSION: Multiple lucencies are noted throughout the spine and pelvis concerning for metastatic disease or multiple myeloma. The largest measures 2.8 cm in the posterior aspect of the right acetabulum. No other abnormality seen in the chest, abdomen or pelvis. Electronically Signed   By: Marijo Conception, M.D.   On: 09/03/2016 14:56   Dg Chest 1 View  Result Date: 09/02/2016 CLINICAL DATA:  Weakness EXAM: CHEST 1 VIEW COMPARISON:  None. FINDINGS: Lungs are clear.  No pleural effusion or pneumothorax. The heart is normal in size. IMPRESSION: No evidence of acute cardiopulmonary disease. Electronically Signed   By: Julian Hy M.D.   On: 09/02/2016 22:55   Ct Chest Wo Contrast  Result Date: 09/03/2016 CLINICAL DATA:  Metastatic disease. EXAM: CT CHEST, ABDOMEN AND PELVIS WITHOUT CONTRAST TECHNIQUE: Multidetector CT imaging of the chest, abdomen and pelvis was performed following the standard protocol without IV contrast. COMPARISON:  Ultrasound of same day.  MRI of September 02, 2016. FINDINGS: CT CHEST FINDINGS Cardiovascular:  No significant vascular findings. Normal heart size. No pericardial effusion. Mediastinum/Nodes: No enlarged mediastinal, hilar, or axillary lymph nodes. Thyroid gland, trachea, and esophagus demonstrate no significant findings. Lungs/Pleura: Lungs are clear. No pleural effusion or pneumothorax. Musculoskeletal: There is noted lucency involving the body and posterior elements of T5 consistent with malignancy. Multiple other smaller lucencies are noted throughout the thoracic spine, also concerning for metastatic disease or multiple myeloma. Lucency is also noted within the manubrium suggesting lesion as  well. CT ABDOMEN PELVIS FINDINGS Hepatobiliary: No focal liver abnormality is seen. No gallstones, gallbladder wall thickening, or biliary dilatation. Pancreas: Unremarkable. No pancreatic ductal dilatation or surrounding inflammatory changes. Spleen: Normal in size without focal abnormality. Adrenals/Urinary Tract: Adrenal glands appear normal. No hydronephrosis or renal obstruction is noted. No renal or ureteral calculi are noted. Foley catheter is noted within the urinary bladder. Stomach/Bowel: The stomach appears normal. There is no evidence of bowel obstruction. Stool is noted throughout the colon. The appendix is not visualized. Vascular/Lymphatic: No significant vascular findings are present. No enlarged abdominal or pelvic lymph nodes. Reproductive: Prostate is unremarkable. Other: No abdominal wall hernia or abnormality. No abdominopelvic ascites. Musculoskeletal: Multiple lucencies are noted throughout the spine and pelvis with the largest measuring 2.8 cm involving the posterior aspect of the right acetabulum. These are concerning for multiple myeloma or metastatic disease. IMPRESSION: Multiple lucencies are noted throughout the spine and pelvis concerning for metastatic disease or multiple myeloma. The largest measures 2.8 cm in the posterior aspect of the right acetabulum. No other abnormality seen in  the chest, abdomen or pelvis. Electronically Signed   By: Marijo Conception, M.D.   On: 09/03/2016 14:56   Mr Thoracic Spine W Wo Contrast  Result Date: 09/02/2016 CLINICAL DATA:  Right leg weakness.  Difficulty urinating. EXAM: MRI THORACIC AND LUMBAR SPINE WITHOUT AND WITH CONTRAST TECHNIQUE: Multiplanar and multiecho pulse sequences of the thoracic and lumbar spine were obtained without and with intravenous contrast. CONTRAST:  46m MULTIHANCE GADOBENATE DIMEGLUMINE 529 MG/ML IV SOLN COMPARISON:  None. FINDINGS: MRI THORACIC SPINE FINDINGS Alignment:  Physiologic. Vertebrae: There is diffuse signal abnormality throughout the thoracic vertebral column bone marrow with innumerable low T1 weighted signal lesions that show enhancement following contrast administration. There is no compression fracture. There is an infiltrative T5 lesion occupying the left aspect of the vertebral body and extending into the posterior elements with a large dorsal epidural component that extends from T4-T6 and measures 1.0 x 1.4 x 6.6 cm (AP x Transverse x CC). This mass anteriorly displaces and compresses the spinal cord. Cord: There is no visible cord signal change. No syrinx or abnormal spinal cord enhancement. Paraspinal and other soft tissues: There is abnormal enhancement of the interspinous soft tissues at the T4-T6 levels. Disc levels: As above, the dorsal epidural mass at the T4-T6 levels effaces the thecal sac and compresses the spinal cord. Otherwise, there is no thoracic spinal canal stenosis. There is mild extension of the mass into the bilateral T5-T6 neural foramina, but the foramina remain patent with mild-to-moderate stenosis. MRI LUMBAR SPINE FINDINGS Segmentation:  Standard Alignment:  Normal Vertebrae: Diffusely abnormal bone marrow signal. No compression fracture. Conus medullaris: Extends to the L1 level and appears normal. No lumbar epidural disease. No compression of the cauda equina nerve roots. Paraspinal and  other soft tissues: The visualized aorta, IVC and iliac vessels are normal. The visualized retroperitoneal organs and paraspinal soft tissues are normal. Disc levels: T12-L1: Normal disc space and facets. No spinal canal or neuroforaminal stenosis. L1-L2: Small central disc protrusion without spinal canal or neural foraminal stenosis. L2-L3: Minimal disc bulge without stenosis. L3-L4: Mild disc bulge without stenosis. L4-L5: Mild bilateral facet hypertrophy with disc desiccation and small bulge. No spinal canal stenosis. Mild bilateral neural foraminal stenosis. L5-S1: Normal disc space and facets. No spinal canal or neuroforaminal stenosis. Visualized sacrum: Diffuse sacral bone marrow abnormality, as throughout the thoracic and lumbar spine. IMPRESSION: 1. Dorsal epidural mass at the T4-T6  level resulting in compression of the thoracic spinal cord. 2. Diffusely abnormal bone marrow signal and contrast enhancement, consistent with a marrow replacement process such as multiple myeloma versus diffuse osseous metastatic disease. 3. No pathologic compression fracture. Critical Value/emergent results were called by telephone at the time of interpretation on 09/02/2016 at 8:34 pm to Dr. Fredia Sorrow, who verbally acknowledged these results. Electronically Signed   By: Ulyses Jarred M.D.   On: 09/02/2016 20:46   Mr Lumbar Spine W Wo Contrast  Result Date: 09/02/2016 CLINICAL DATA:  Right leg weakness.  Difficulty urinating. EXAM: MRI THORACIC AND LUMBAR SPINE WITHOUT AND WITH CONTRAST TECHNIQUE: Multiplanar and multiecho pulse sequences of the thoracic and lumbar spine were obtained without and with intravenous contrast. CONTRAST:  64m MULTIHANCE GADOBENATE DIMEGLUMINE 529 MG/ML IV SOLN COMPARISON:  None. FINDINGS: MRI THORACIC SPINE FINDINGS Alignment:  Physiologic. Vertebrae: There is diffuse signal abnormality throughout the thoracic vertebral column bone marrow with innumerable low T1 weighted signal lesions  that show enhancement following contrast administration. There is no compression fracture. There is an infiltrative T5 lesion occupying the left aspect of the vertebral body and extending into the posterior elements with a large dorsal epidural component that extends from T4-T6 and measures 1.0 x 1.4 x 6.6 cm (AP x Transverse x CC). This mass anteriorly displaces and compresses the spinal cord. Cord: There is no visible cord signal change. No syrinx or abnormal spinal cord enhancement. Paraspinal and other soft tissues: There is abnormal enhancement of the interspinous soft tissues at the T4-T6 levels. Disc levels: As above, the dorsal epidural mass at the T4-T6 levels effaces the thecal sac and compresses the spinal cord. Otherwise, there is no thoracic spinal canal stenosis. There is mild extension of the mass into the bilateral T5-T6 neural foramina, but the foramina remain patent with mild-to-moderate stenosis. MRI LUMBAR SPINE FINDINGS Segmentation:  Standard Alignment:  Normal Vertebrae: Diffusely abnormal bone marrow signal. No compression fracture. Conus medullaris: Extends to the L1 level and appears normal. No lumbar epidural disease. No compression of the cauda equina nerve roots. Paraspinal and other soft tissues: The visualized aorta, IVC and iliac vessels are normal. The visualized retroperitoneal organs and paraspinal soft tissues are normal. Disc levels: T12-L1: Normal disc space and facets. No spinal canal or neuroforaminal stenosis. L1-L2: Small central disc protrusion without spinal canal or neural foraminal stenosis. L2-L3: Minimal disc bulge without stenosis. L3-L4: Mild disc bulge without stenosis. L4-L5: Mild bilateral facet hypertrophy with disc desiccation and small bulge. No spinal canal stenosis. Mild bilateral neural foraminal stenosis. L5-S1: Normal disc space and facets. No spinal canal or neuroforaminal stenosis. Visualized sacrum: Diffuse sacral bone marrow abnormality, as throughout  the thoracic and lumbar spine. IMPRESSION: 1. Dorsal epidural mass at the T4-T6 level resulting in compression of the thoracic spinal cord. 2. Diffusely abnormal bone marrow signal and contrast enhancement, consistent with a marrow replacement process such as multiple myeloma versus diffuse osseous metastatic disease. 3. No pathologic compression fracture. Critical Value/emergent results were called by telephone at the time of interpretation on 09/02/2016 at 8:34 pm to Dr. SFredia Sorrow who verbally acknowledged these results. Electronically Signed   By: KUlyses JarredM.D.   On: 09/02/2016 20:46   [place]al  Result Date: 09/03/2016 CLINICAL DATA:  Acute kidney injury. EXAM: RENAL / URINARY TRACT ULTRASOUND COMPLETE COMPARISON:  None. FINDINGS: Right Kidney: Length: 12.3 cm. Echogenicity within normal limits. No mass or hydronephrosis visualized. Left Kidney: Length: 11.3 cm. Echogenicity within normal limits.  No mass or hydronephrosis visualized. Bladder: Decompressed with a Foley catheter. IMPRESSION: Negative exam. Electronically Signed   By: Staci Righter M.D.   On: 09/03/2016 07:52   Scheduled Meds: . Chlorhexidine Gluconate Cloth  6 each Topical Once  . dexamethasone  4 mg Intravenous Q6H  . pantoprazole (PROTONIX) IV  40 mg Intravenous Daily  . senna  1 tablet Oral Daily  . sodium chloride flush  3 mL Intravenous Q12H   Continuous Infusions: . sodium chloride 75 mL/hr at 09/03/16 1715    LOS: 1 day   Kerney Elbe, DO [facility] Pager [phone]  If 7PM-7AM, please contact night-coverage [email or web address] Password [email or web address] 09/04/2016, 11:53 AM

## 2016-09-04 NOTE — Progress Notes (Signed)
No acute events overnight Lower extremity sensory and motor exam essentially unchanged All questions answered Will hopefully be able to get to IR for marker placement before surgery

## 2016-09-04 NOTE — Anesthesia Postprocedure Evaluation (Signed)
Anesthesia Post Note  Patient: Alexander Saunders  Procedure(s) Performed: Procedure(s) (LRB): THORACIC FOUR-SIX LAMINECTOMY FOR RESECTION OF TUMOR (N/A)     Patient location during evaluation: PACU Anesthesia Type: General Level of consciousness: awake and alert Pain management: pain level controlled Vital Signs Assessment: post-procedure vital signs reviewed and stable Respiratory status: spontaneous breathing, nonlabored ventilation and respiratory function stable Cardiovascular status: blood pressure returned to baseline and stable Postop Assessment: no signs of nausea or vomiting Anesthetic complications: no    Last Vitals:  Vitals:   09/04/16 1648 09/04/16 1700  BP:  (!) 142/71  Pulse:  (!) 53  Resp:  16  Temp: (!) 36.1 C 36.5 C    Last Pain:  Vitals:   09/04/16 1700  TempSrc: Oral  PainSc:                  [name],W. EDMOND

## 2016-09-05 ENCOUNTER — Encounter (HOSPITAL_COMMUNITY): Payer: Self-pay | Admitting: Neurological Surgery

## 2016-09-05 LAB — TYPE AND SCREEN
ABO/RH(D): O POS
Antibody Screen: NEGATIVE
Unit division: 0
Unit division: 0
Unit division: 0
Unit division: 0

## 2016-09-05 LAB — BPAM RBC
Blood Product Expiration Date: 201808292359
Blood Product Expiration Date: 201808292359
Blood Product Expiration Date: 201808302359
Blood Product Expiration Date: 201808302359
ISSUE DATE / TIME: 201808021432
ISSUE DATE / TIME: 201808021432
Unit Type and Rh: 5100
Unit Type and Rh: 5100
Unit Type and Rh: 5100
Unit Type and Rh: 5100

## 2016-09-05 LAB — CBC WITH DIFFERENTIAL/PLATELET
Basophils Absolute: 0 10*3/uL (ref 0.0–0.1)
Basophils Relative: 0 %
Eosinophils Absolute: 0 10*3/uL (ref 0.0–0.7)
Eosinophils Relative: 0 %
HCT: 30.5 % — ABNORMAL LOW (ref 39.0–52.0)
Hemoglobin: 10.4 g/dL — ABNORMAL LOW (ref 13.0–17.0)
Lymphocytes Relative: 7 %
Lymphs Abs: 1 10*3/uL (ref 0.7–4.0)
MCH: 30.6 pg (ref 26.0–34.0)
MCHC: 34.1 g/dL (ref 30.0–36.0)
MCV: 89.7 fL (ref 78.0–100.0)
Monocytes Absolute: 0.9 10*3/uL (ref 0.1–1.0)
Monocytes Relative: 6 %
Neutro Abs: 12.6 10*3/uL — ABNORMAL HIGH (ref 1.7–7.7)
Neutrophils Relative %: 87 %
Platelets: 242 10*3/uL (ref 150–400)
RBC: 3.4 MIL/uL — ABNORMAL LOW (ref 4.22–5.81)
RDW: 15.1 % (ref 11.5–15.5)
WBC: 14.5 10*3/uL — ABNORMAL HIGH (ref 4.0–10.5)

## 2016-09-05 LAB — COMPREHENSIVE METABOLIC PANEL
ALT: 18 U/L (ref 17–63)
AST: 32 U/L (ref 15–41)
Albumin: 3.5 g/dL (ref 3.5–5.0)
Alkaline Phosphatase: 21 U/L — ABNORMAL LOW (ref 38–126)
Anion gap: 8 (ref 5–15)
BUN: 15 mg/dL (ref 6–20)
CO2: 24 mmol/L (ref 22–32)
Calcium: 9.3 mg/dL (ref 8.9–10.3)
Chloride: 106 mmol/L (ref 101–111)
Creatinine, Ser: 1.05 mg/dL (ref 0.61–1.24)
GFR calc Af Amer: >60 mL/min (ref >60)
GFR calc non Af Amer: >60 mL/min (ref >60)
Glucose, Bld: 115 mg/dL — ABNORMAL HIGH (ref 65–99)
Potassium: 4.1 mmol/L (ref 3.5–5.1)
Sodium: 138 mmol/L (ref 135–145)
Total Bilirubin: 0.7 mg/dL (ref 0.3–1.2)
Total Protein: 5.6 g/dL — ABNORMAL LOW (ref 6.5–8.1)

## 2016-09-05 LAB — CBC
HCT: 31 % — ABNORMAL LOW (ref 39.0–52.0)
Hemoglobin: 10.3 g/dL — ABNORMAL LOW (ref 13.0–17.0)
MCH: 29.8 pg (ref 26.0–34.0)
MCHC: 33.2 g/dL (ref 30.0–36.0)
MCV: 89.6 fL (ref 78.0–100.0)
Platelets: 259 10*3/uL (ref 150–400)
RBC: 3.46 MIL/uL — ABNORMAL LOW (ref 4.22–5.81)
RDW: 14.7 % (ref 11.5–15.5)
WBC: 15.4 10*3/uL — ABNORMAL HIGH (ref 4.0–10.5)

## 2016-09-05 LAB — BASIC METABOLIC PANEL
Anion gap: 10 (ref 5–15)
BUN: 13 mg/dL (ref 6–20)
CO2: 25 mmol/L (ref 22–32)
Calcium: 9.6 mg/dL (ref 8.9–10.3)
Chloride: 105 mmol/L (ref 101–111)
Creatinine, Ser: 1.03 mg/dL (ref 0.61–1.24)
GFR calc Af Amer: >60 mL/min (ref >60)
GFR calc non Af Amer: >60 mL/min (ref >60)
Glucose, Bld: 112 mg/dL — ABNORMAL HIGH (ref 65–99)
Potassium: 4 mmol/L (ref 3.5–5.1)
Sodium: 140 mmol/L (ref 135–145)

## 2016-09-05 LAB — POCT I-STAT 4, (NA,K, GLUC, HGB,HCT)
Glucose, Bld: 149 mg/dL — ABNORMAL HIGH (ref 65–99)
HCT: 26 % — ABNORMAL LOW (ref 39.0–52.0)
Hemoglobin: 8.8 g/dL — ABNORMAL LOW (ref 13.0–17.0)
Potassium: 4.1 mmol/L (ref 3.5–5.1)
Sodium: 141 mmol/L (ref 135–145)

## 2016-09-05 LAB — PHOSPHORUS: Phosphorus: 3.1 mg/dL (ref 2.5–4.6)

## 2016-09-05 LAB — MAGNESIUM: Magnesium: 1.7 mg/dL (ref 1.7–2.4)

## 2016-09-05 MED ORDER — SODIUM CHLORIDE 0.9% FLUSH: 3.0000 mL | INTRAVENOUS | Status: DC | PRN

## 2016-09-05 MED ORDER — ACETAMINOPHEN 500 MG PO TABS
1000.0000 mg | ORAL_TABLET | Freq: Four times a day (QID) | ORAL | Status: DC
  Administered 2016-09-05 – 2016-09-06 (×5): 1000 mg via ORAL
  Filled 2016-09-05 (×5): qty 2

## 2016-09-05 MED ORDER — CEFAZOLIN SODIUM-DEXTROSE 1-4 GM/50ML-% IV SOLN
1.0000 g | Freq: Three times a day (TID) | INTRAVENOUS | Status: DC
  Administered 2016-09-05 – 2016-09-06 (×4): 1 g via INTRAVENOUS
  Filled 2016-09-05 (×6): qty 50

## 2016-09-05 MED ORDER — BISACODYL 10 MG RE SUPP: 10.0000 mg | Freq: Every day | RECTAL | Status: DC | PRN

## 2016-09-05 MED ORDER — PHENOL 1.4 % MT LIQD: 1.0000 | OROMUCOSAL | Status: DC | PRN

## 2016-09-05 MED ORDER — SENNA 8.6 MG PO TABS: 1.0000 | ORAL_TABLET | Freq: Two times a day (BID) | ORAL | Status: DC

## 2016-09-05 MED ORDER — ONDANSETRON HCL 4 MG PO TABS: 4.0000 mg | ORAL_TABLET | Freq: Four times a day (QID) | ORAL | Status: DC | PRN

## 2016-09-05 MED ORDER — FLEET ENEMA 7-19 GM/118ML RE ENEM: 1.0000 | ENEMA | Freq: Once | RECTAL | Status: DC | PRN

## 2016-09-05 MED ORDER — TAMSULOSIN HCL 0.4 MG PO CAPS
0.4000 mg | ORAL_CAPSULE | Freq: Every day | ORAL | Status: DC
  Administered 2016-09-05 – 2016-09-06 (×2): 0.4 mg via ORAL
  Filled 2016-09-05 (×2): qty 1

## 2016-09-05 MED ORDER — PANTOPRAZOLE SODIUM 40 MG IV SOLR: 40.0000 mg | Freq: Every day | INTRAVENOUS | Status: DC

## 2016-09-05 MED ORDER — MENTHOL 3 MG MT LOZG: 1.0000 | LOZENGE | OROMUCOSAL | Status: DC | PRN

## 2016-09-05 MED ORDER — DOCUSATE SODIUM 100 MG PO CAPS: 100.0000 mg | ORAL_CAPSULE | Freq: Two times a day (BID) | ORAL | Status: DC

## 2016-09-05 MED ORDER — POTASSIUM CHLORIDE IN NACL 20-0.9 MEQ/L-% IV SOLN
100.0000 mL/h | INTRAVENOUS | Status: DC
  Administered 2016-09-05 – 2016-09-06 (×3): 100 mL/h via INTRAVENOUS
  Filled 2016-09-05 (×4): qty 1000

## 2016-09-05 MED ORDER — ONDANSETRON HCL 4 MG/2ML IJ SOLN: 4.0000 mg | Freq: Four times a day (QID) | INTRAMUSCULAR | Status: DC | PRN

## 2016-09-05 MED ORDER — ALUM & MAG HYDROXIDE-SIMETH 200-200-20 MG/5ML PO SUSP: 30.0000 mL | Freq: Four times a day (QID) | ORAL | Status: DC | PRN

## 2016-09-05 MED ORDER — PANTOPRAZOLE SODIUM 40 MG PO TBEC
40.0000 mg | DELAYED_RELEASE_TABLET | Freq: Every day | ORAL | Status: DC
  Administered 2016-09-06: 40 mg via ORAL
  Filled 2016-09-05: qty 1

## 2016-09-05 MED ORDER — METHOCARBAMOL 750 MG PO TABS
750.0000 mg | ORAL_TABLET | Freq: Four times a day (QID) | ORAL | Status: DC
  Administered 2016-09-05 – 2016-09-06 (×6): 750 mg via ORAL
  Filled 2016-09-05 (×6): qty 1

## 2016-09-05 MED ORDER — OXYCODONE HCL ER 10 MG PO T12A
10.0000 mg | EXTENDED_RELEASE_TABLET | Freq: Two times a day (BID) | ORAL | Status: DC
  Administered 2016-09-05 – 2016-09-06 (×3): 10 mg via ORAL
  Filled 2016-09-05 (×3): qty 1

## 2016-09-05 MED ORDER — OXYCODONE HCL 5 MG PO TABS: 5.0000 mg | ORAL_TABLET | ORAL | Status: DC | PRN

## 2016-09-05 MED ORDER — GABAPENTIN 300 MG PO CAPS
300.0000 mg | ORAL_CAPSULE | Freq: Three times a day (TID) | ORAL | Status: DC
  Administered 2016-09-05 – 2016-09-06 (×4): 300 mg via ORAL
  Filled 2016-09-05 (×4): qty 1

## 2016-09-05 MED ORDER — SODIUM CHLORIDE 0.9% FLUSH
3.0000 mL | Freq: Two times a day (BID) | INTRAVENOUS | Status: DC
  Administered 2016-09-05: 3 mL via INTRAVENOUS

## 2016-09-05 MED ORDER — CELECOXIB 200 MG PO CAPS
200.0000 mg | ORAL_CAPSULE | Freq: Two times a day (BID) | ORAL | Status: DC
  Administered 2016-09-05 – 2016-09-06 (×3): 200 mg via ORAL
  Filled 2016-09-05 (×4): qty 1

## 2016-09-05 MED ORDER — DIAZEPAM 5 MG PO TABS: 5.0000 mg | ORAL_TABLET | Freq: Four times a day (QID) | ORAL | Status: DC | PRN

## 2016-09-05 MED ORDER — SODIUM CHLORIDE 0.9 % IV SOLN
250.0000 mL | INTRAVENOUS | Status: DC
  Administered 2016-09-05 (×2): 250 mL via INTRAVENOUS

## 2016-09-05 NOTE — Progress Notes (Signed)
Physical Therapy Evaluation Patient Details Name: Alexander Saunders MRN: [id number] DOB: [birth year] Today's Date: 09/05/2016   History of Present Illness  Alexander Saunders is a 66 y/o male with no significant PMH presenting with B LE weakness. Patient also with back pain radiating into mid-low back as well as chest wall. Imaging revealing epidural mass with T4-6 laminectomy performed on 09/04/16.  Clinical Impression  Alexander Saunders doing well today with good motivation and excitement to participate with with PT. Patient with good strength of B LE with R ankle DF most impaired. Patient requiring Min A to [facility] for all bed mobility and transfers secondary to pain, with pain quickly resolving following position change. Patient today able to ambulate to hallway from room with RW with [facility]. Recommending RW for home use due to difficulty walking as well as poor R foot clearance increasing his fall risk. PT to continue to follow acutely to maximize functional mobility prior to d/c.     Follow Up Recommendations Outpatient PT;DC plan and follow up therapy as arranged by surgeon    Equipment Recommendations  Rolling walker with 5" wheels    Recommendations for Other Services       Precautions / Restrictions Precautions Precautions: Fall Restrictions Weight Bearing Restrictions: No      Mobility  Bed Mobility Overal bed mobility: Needs Assistance Bed Mobility: Supine to Sit;Sit to Supine     Supine to sit: Min assist Sit to supine: Min assist   General bed mobility comments: Min A for LE management  Transfers Overall transfer level: Needs assistance Equipment used: Rolling walker (2 wheeled) Transfers: Sit to/from Stand Sit to Stand: Min assist            Ambulation/Gait Ambulation/Gait assistance: Min guard Ambulation Distance (Feet): 40 Feet Assistive device: Rolling walker (2 wheeled) Gait Pattern/deviations: Step-through pattern;Decreased stride length;Decreased dorsiflexion  - right;Decreased dorsiflexion - left     General Gait Details: poor R foot clearance  Stairs            Wheelchair Mobility    Modified Rankin (Stroke Patients Only)       Balance Overall balance assessment: Needs assistance Sitting-balance support: Single extremity supported;Feet supported       Standing balance support: Bilateral upper extremity supported;During functional activity                                 Pertinent Vitals/Pain Pain Assessment: 0-10 Pain Score: 5  Pain Location: mid-upper back Pain Descriptors / Indicators: Aching;Discomfort Pain Intervention(s): Limited activity within patient's tolerance;Monitored during session;Repositioned    Home Living Family/patient expects to be discharged to:: Private residence Living Arrangements: Spouse/significant other Available Help at Discharge: Family Type of Home: House Home Access: Stairs to enter   Technical brewer of Steps: 6 Home Layout: Two level Home Equipment: None      Prior Function Level of Independence: Independent         Comments: retired; Magazine features editor        Extremity/Trunk Assessment   Upper Extremity Assessment Upper Extremity Assessment: Defer to OT evaluation    Lower Extremity Assessment Lower Extremity Assessment: Generalized weakness (B LE grossly 4/5; R foot DF 4-/5)       Communication   Communication: No difficulties  Cognition Arousal/Alertness: Awake/alert Behavior During Therapy: WFL for tasks assessed/performed Overall Cognitive Status: Within Functional Limits for tasks assessed  General Comments      Exercises General Exercises - Lower Extremity Hip Flexion/Marching: Both;10 reps   Assessment/Plan    PT Assessment Patient needs continued PT services  PT Problem List Decreased strength;Decreased activity tolerance;Decreased balance;Decreased  mobility;Pain       PT Treatment Interventions DME instruction;Gait training;Stair training;Functional mobility training;Therapeutic activities;Therapeutic exercise;Balance training;Patient/family education    PT Goals (Current goals can be found in the Care Plan section)  Acute Rehab PT Goals Patient Stated Goal: go home, restore strength PT Goal Formulation: With patient Time For Goal Achievement: 09/12/16 Potential to Achieve Goals: Good    Frequency Min 5X/week   Barriers to discharge        Co-evaluation               AM-PAC PT "6 Clicks" Daily Activity  Outcome Measure Difficulty turning over in bed (including adjusting bedclothes, sheets and blankets)?: Total Difficulty moving from lying on back to sitting on the side of the bed? : Total Difficulty sitting down on and standing up from a chair with arms (e.g., wheelchair, bedside commode, etc,.)?: Total Help needed moving to and from a bed to chair (including a wheelchair)?: A Little Help needed walking in hospital room?: A Little Help needed climbing 3-5 steps with a railing? : A Lot 6 Click Score: 11    End of Session Equipment Utilized During Treatment: Gait belt Activity Tolerance: Patient tolerated treatment well Patient left: in chair;with call bell/phone within reach Nurse Communication: Mobility status PT Visit Diagnosis: Unsteadiness on feet (R26.81);Other abnormalities of gait and mobility (R26.89);Muscle weakness (generalized) (M62.81)    Time: 3846-6599 PT Time Calculation (min) (ACUTE ONLY): 32 min   Charges:   PT Evaluation $PT Eval Low Complexity: 1 Low PT Treatments $Gait Training: 8-22 mins   PT G Codes:        Lanney Gins, PT, DPT 09/05/16 5:07 PM

## 2016-09-05 NOTE — Progress Notes (Signed)
CM received consult :   Comment   Reason for consult: Home health needs    Equipment    Medication needs    Other (see comments)       Comments   PT/OT/SLP DME as needed  PT/OT consults pending... CM to f/u with disposition needs. Whitman Hero RN,BSN,CM

## 2016-09-05 NOTE — Progress Notes (Signed)
Pt seen and examined. No issues overnight.  EXAM: Temp:  [97 F (36.1 C)-98.2 F (36.8 C)] 98.2 F (36.8 C) (08/03 0519) Pulse Rate:  [50-70] 58 (08/03 0519) Resp:  [12-18] 18 (08/03 0519) BP: (126-148)/(56-81) 126/56 (08/03 0519) SpO2:  [98 %-100 %] 98 % (08/03 0519) Weight:  [89.5 kg (197 lb 6.4 oz)] 89.5 kg (197 lb 6.4 oz) (08/02 1222) Intake/Output      08/02 0701 - 08/03 0700 08/03 0701 - 08/04 0700   P.O. 0    I.V. (mL/kg) 1408.8 (15.7)    Blood 630    IV Piggyback 250    Total Intake(mL/kg) 2288.8 (25.6)    Urine (mL/kg/hr) 1700 (0.8)    Drains 100    Blood 1100    Total Output 2900     Net -611.2           Awake and alert Follows commands throughout 4+/5 strength throughout BLE Incision c/d/i Sensation improved  Neurologically improved Keep drain for one more day Started flomax; d/c foley Wean steroids over 5-7 days

## 2016-09-05 NOTE — Progress Notes (Signed)
PROGRESS NOTE    Alexander Saunders  NOB:[id number] DOB: 1950/11/18 DOA: 09/02/2016 PCP: Benito Mccreedy, MD   Brief Narrative:  Alexander Saunders is a 66 y.o. male without a significant past medical history who presented with complaints of lower extremity weakness. For the last 8 weeks patient has complained of pain on Alexander Saunders left mid-lower back that would radiate to the side of Alexander Saunders chest /pectoral muscle. Pain continued intermittently become sharp as he notes with coughing. He is also reported progressively worsening weakness on the right leg worse than the left and reports numbness and tingling sensation. The patient is very active at baseline and works out on a daily basis. He also reports weight loss of approximately 8 pounds. He has been taking ibuprofen 800 mg since the onset of symptoms with only mild relief. It seems he initially attributed pain to muscle strain. Alexander Saunders wife reports that he went hiking a few weeks ago but he does report being bitten by any ticks. However, last night patient had gotten up to use the bathroom and had fallen due to the right leg weakness. Since that time he also reports that he has not been able to urinate and has had some constipation. Foley catheter was placed for urinary retention.  Neurosurgery was consulted and saw the patient for a mass of thoracic spine found on MRI. Patient underwent CT of Chest/Abd/Pelvis for further Metastatic workup which showed no abnormality in the chest, abdomen but had had multiple lucencies noted throughout the spine and pelvis concerning for metastatic disease or multiple myeloma. The largest lucency measured 2.8 cm in the posterior aspect of the right acetabulum.  Patient to underwent T4-T6 Laminectomy on 09/04/16 by Dr. Cyndy Freeze and was transfused 2 units of pRBCs during the surgery. Per Dr. Cyndy Freeze will remove drain tomorrow and start PT/OT. Patient will also need to have steroids weaned over 5-7 days and Foley Catheter will be removed today  and Flomax initiated. Case was discussed with Dr. Alen Blew of Oncology and he states will need a biopsy to confirm diagnosis before Oncology gets involved. Biopsy pathology is still pending. Patient feels as if Alexander Saunders legs are getting stronger but still has intermittent numbness and tingling but not as severe.   Assessment & Plan:   Principal Problem:   Epidural mass Active Problems:   Lower extremity weakness   Acute kidney injury (HCC)   Urinary retention   Leukocytosis   Hypercalcemia   Spinal cord lesion (HCC)  Lower Extremity Weakness 2/2 Epidural Mass with Cord Compression concern for Metastatic Disease vs Multiple Myeloma s/p T4-T6 Laminectomy POD1; improving -Acute. Patient presented with a week history of progressively worsening back pain and lower extremity weakness. MRI reveals epidural mass with signs of metastatic disease or multiple myeloma likely causing symptoms. Patient's last colonoscopy in 2010. - Admitted to a [facility] - Discussed with PCP Dr. Scherrie Merritts he stated last PSA results were 12/25/15 and that Alexander Saunders PSA level was 1.6 - Continue IV Dexamethasone 4 mg IV q6h and will need tapering over next 5-7 days; C/w Pantoprazole 40 mg po Daily  - Pain Control: C/w Acteminophen 1000 mg po q6h, Oxycodone 10 mg q12h and Oxyxcodone IR 5-10 mg po q3hprn and Morphine 1 mg IV q4hprn for Severe Pain; Stopped Hydrocodone-Acetaminophen; Patient also placed on Celecoxib 200 mg po q12h by Neurosurgery -Neurosurgery added Valium 5 mg q6hprn for Muscle Spasms as well as Methocarbamol 750 mg po 4 Times daily -C/w Zofran 4 mg po/IV q6hprn for N/V -Added  Senna-Docusate and Polyethylene Glycol 17 mg po BID - Check TSH was 0.940, ESR (20), CRP (<0.8), SPEP still pending - Obtained CT Chest/Abd/Pelvis w/o Contrast for further metastatic workup and showed Multiple lucencies are noted throughout the spine and pelvis concerning for metastatic disease or multiple myeloma. The largest measures 2.8 cm in  the posterior aspect of the right acetabulum. No other abnormality seen in the chest, abdomen or pelvis. - Discussed with Dr. Alen Blew of Oncology and recommends no further workup at this time as patient is going for T4-T6 Laminectomy for Tumor Resection done by Dr. [name] Ditty; Biopsy sent and Pathology is Pending  - PT/OT to Eval and Treat - Appreciate neurosurgery consultative services will follow-up for further recommendations; Patient underwent T4-T6 Laminectomy for resection of extradural thoracic spinal mass -Neurosurgery ordered CT T Spine Limited w/o or w Contrast to ensure correct level was selected -Per Neurosurgery Keep drain in for 1 more day and Start Tamsulosin and D/C Foley -Will need to Wean Steroids over 5-7 day course   Acute kidney injury with Urinary Retention, improving -Baseline creatinine unknown at this time but presents with creatinine 1.6 and BUN 26.  -BUN/Cr went from 26/1.60 -> 19/1.34 -> 18/1.14 -> 16/1.02 -> 15/1.05 -Foley catheter was inserted. Urinalysis otherwise negative.  -Suspect acute kidney injury likely related to recent use of NSAIDs along with urinary retention. - Discontinue Foley catheter today and Flomax started by Neuro Surgery - IV fluids of NS at 75 ml/hr now D/C'd; Was placed on NS + 20 mEQ of KCL at 100 mL/hr and was instructed to Saline lock when tolerating po  - Check urine FENa (Urine Lytes still pending) - Checked Renal Ultrasound and was Negative Exam - Discontinued nephrotoxic agents including ibuprofen - Continue to Monitor and repeat CMP in AM  Leukocytosis - WBC 10.6 on admission. WBC went from 10.6 -> 9.9 -> 12.4 -> 14.6 -> 14.5 - Suspect likely reactive to above in conjunction with IV Steroid Demargination now - Will need to wean steroids over next 5 days - Urinalysis negative for any signs of infection. - Continue to Monitor and repeat CBC in AM  Normocytic Normochromic Anemia and ABLA - After Discussion with patient's PCP,  PCP was concerned about Anemia and wanted to pursue Further workup for Cancer and had ordered a CT Scan; Patient stated he would do Anemia workup at the [facility] per my conversation with PCP - Hemoglobin 11 on admission with no previous baseline available - Patient was transfused 2 units of pRBC's during surgery yesterday - Hb/Hct went from 11.0/31.7 -> 10.8/31.8 -> 10.9/31.8 -> 10.6/31.1 -> 10.4/30.5 - Continue to Monitor for S/Sx of Infection and Repeat CBC in AM  Constipation, Chronic  -Patient Admitted to having Chronic Constipation and that last bowel movement was 7 days ago -Changed Senna to Senna-Docusate 1 tab BID; IVF Rehydration D/C'd -Added Polyethylene Glycol BID -C/w Bisacodyl 10 mg RC Daily Prn  Hypercalcemia, improved  - ? Hypercalcemia of Malignancy - Acute. Initial calcium 10.7 and improved to 9.3. - IV fluids now D/C'd  - Check Ionized Calcium and was slightly elevated at 5.9  DVT prophylaxis: SCDs Code Status: FULL CODE Family Communication: Discussed with wife at bedside Disposition Plan: Remain Inpatient and Anticipate D/C in next 24-48 hours  Consultants:   Neurosurgery Dr. [name] Ditty  Discussed Case with Oncology Dr. Zola Button   Procedures: T4-T6 Laminectomy for Tumor Excision    Antimicrobials:  Anti-infectives    Start  Dose/Rate Route Frequency Ordered Stop   09/05/16 0600  ceFAZolin (ANCEF) IVPB 2g/100 mL premix  Status:  Discontinued     2 g 200 mL/hr over 30 Minutes Intravenous On call to O.R. 09/03/16 1600 09/03/16 1602   09/04/16 1421  bacitracin 50,000 Units in sodium chloride irrigation 0.9 % 500 mL irrigation  Status:  Discontinued       As needed 09/04/16 1422 09/04/16 1548   09/04/16 0600  ceFAZolin (ANCEF) IVPB 2g/100 mL premix     2 g 200 mL/hr over 30 Minutes Intravenous To Surgery 09/03/16 1602 09/04/16 1122     Subjective: Seen and examined and stated he felt even stronger. States Numbness had improved. No nausea or  vomiting states he had some mild back pain. Thinks he will have a bowel movement soon. No CP or SOB. No other concerns or complaints and pathology is still not back.   Objective: Vitals:   09/04/16 1648 09/04/16 1700 09/04/16 2135 09/05/16 0519  BP:  (!) 142/71 126/65 (!) 126/56  Pulse:  (!) 53 62 (!) 58  Resp:  _0 Temp: (!) 97 F (36.1 C) 97.7 F (36.5 C) 97.6 F (36.4 C) 98.2 F (36.8 C)  TempSrc:  Oral Oral Oral  SpO2:  100% 100% 98%  Weight:      Height:        Intake/Output Summary (Last 24 hours) at 09/05/16 1203 Last data filed at 09/05/16 1021  Gross per 24 hour  Intake          2288.83 ml  Output             2900 ml  Net          -611.17 ml   Filed Weights   09/02/16 1033 09/02/16 2356 09/04/16 1222  Weight: 87.1 kg (192 lb) 89.5 kg (197 lb 6.4 oz) 89.5 kg (197 lb 6.4 oz)   Examination: Physical Exam:  Constitutional: WN/WD AAM in NAD appears comfortable.  Eyes: Sclerae anicteric; Conjunctivae Non-injected ENMT: Grossly normal Hearing. Mucous Membranes appear moist Neck: Supple with no JVD Respiratory: Slightly diminished but no wheezing/rales/rhonchi. Patient not tachypenic or using any accessory muscles to breathe Cardiovascular: Slightly bradycardic but Normal Rhythm. No lower extremity edema Abdomen: Soft, NT, ND. Bowel sounds present GU: Foley catheter in placer Musculoskeletal: No contractures; No cyanosis Skin: Warm and Dry. Has drain in place. Did not sit up to view back incisions. No rashes or lesions on a limited skin eval Neurologic: CN 2-12 grossly intact. Right Lower Extremity Strength is 4/5. Romberg sign not assessed Psychiatric: Normal mood and affect. Intact judgement and insight.  Data Reviewed: I have personally reviewed following labs and imaging studies  CBC:  Recent Labs Lab 09/02/16 1208 09/02/16 2359 09/03/16 0913 09/04/16 0419 09/05/16 0357  WBC 10.6* 9.9 12.4* 14.6* 14.5*  NEUTROABS 7.1  --  11.3* 13.0* 12.6*  HGB  11.0* 10.8* 10.9* 10.6* 10.4*  HCT 31.7* 31.8* 31.8* 31.1* 30.5*  MCV 89.8 92.2 91.1 91.5 89.7  PLT 321 337 350 351 117   Basic Metabolic Panel:  Recent Labs Lab 09/02/16 1208 09/02/16 2359 09/03/16 0913 09/04/16 0419 09/05/16 0357  NA 140 138 137 140 138  K 4.0 4.0 3.9 3.9 4.1  CL 107 104 105 110 106  CO2 _1 GLUCOSE 101* 115* 148* 120* 115*  BUN 26* _2 CREATININE 1.60* 1.34* 1.14 1.02 1.05  CALCIUM 10.7* 10.5* 10.3 10.0 9.3  MG  --   --  1.8 1.8 1.7  PHOS  --   --  3.8 3.2 3.1   GFR: Estimated Creatinine Clearance: 74.9 mL/min (by C-G formula based on SCr of 1.05 mg/dL). Liver Function Tests:  Recent Labs Lab 09/02/16 1208 09/03/16 0913 09/04/16 0419 09/05/16 0357  AST _0 32  ALT 18 16* 22 18  ALKPHOS 30* 28* 26* 21*  BILITOT 0.8 0.7 0.4 0.7  PROT 7.5 6.8 6.4* 5.6*  ALBUMIN 4.6 4.1 3.7 3.5   No results for input(s): LIPASE, AMYLASE in the last 168 hours. No results for input(s): AMMONIA in the last 168 hours. Coagulation Profile:  Recent Labs Lab 09/04/16 0952  INR 0.99   Cardiac Enzymes: No results for input(s): CKTOTAL, CKMB, CKMBINDEX, TROPONINI in the last 168 hours. BNP (last 3 results) No results for input(s): PROBNP in the last 8760 hours. HbA1C: No results for input(s): HGBA1C in the last 72 hours. CBG: No results for input(s): GLUCAP in the last 168 hours. Lipid Profile: No results for input(s): CHOL, HDL, LDLCALC, TRIG, CHOLHDL, LDLDIRECT in the last 72 hours. Thyroid Function Tests:  Recent Labs  09/02/16 2359  TSH 0.940   Anemia Panel: No results for input(s): VITAMINB12, FOLATE, FERRITIN, TIBC, IRON, RETICCTPCT in the last 72 hours. Sepsis Labs: No results for input(s): PROCALCITON, LATICACIDVEN in the last 168 hours.  Recent Results (from the past 240 hour(s))  Surgical pcr screen     Status: None   Collection Time: 09/04/16 12:17 AM  Result Value Ref Range Status   MRSA, PCR NEGATIVE NEGATIVE  Final   Staphylococcus aureus NEGATIVE NEGATIVE Final    Comment:        The Xpert SA Assay (FDA approved for NASAL specimens in patients over 58 years of age), is one component of a comprehensive surveillance program.  Test performance has been validated by [facility] for patients greater than or equal to 82 year old. It is not intended to diagnose infection nor to guide or monitor treatment.      Radiology Studies: Ct Abdomen Pelvis Wo Contrast  Result Date: 09/03/2016 CLINICAL DATA:  Metastatic disease. EXAM: CT CHEST, ABDOMEN AND PELVIS WITHOUT CONTRAST TECHNIQUE: Multidetector CT imaging of the chest, abdomen and pelvis was performed following the standard protocol without IV contrast. COMPARISON:  Ultrasound of same day.  MRI of September 02, 2016. FINDINGS: CT CHEST FINDINGS Cardiovascular: No significant vascular findings. Normal heart size. No pericardial effusion. Mediastinum/Nodes: No enlarged mediastinal, hilar, or axillary lymph nodes. Thyroid gland, trachea, and esophagus demonstrate no significant findings. Lungs/Pleura: Lungs are clear. No pleural effusion or pneumothorax. Musculoskeletal: There is noted lucency involving the body and posterior elements of T5 consistent with malignancy. Multiple other smaller lucencies are noted throughout the thoracic spine, also concerning for metastatic disease or multiple myeloma. Lucency is also noted within the manubrium suggesting lesion as well. CT ABDOMEN PELVIS FINDINGS Hepatobiliary: No focal liver abnormality is seen. No gallstones, gallbladder wall thickening, or biliary dilatation. Pancreas: Unremarkable. No pancreatic ductal dilatation or surrounding inflammatory changes. Spleen: Normal in size without focal abnormality. Adrenals/Urinary Tract: Adrenal glands appear normal. No hydronephrosis or renal obstruction is noted. No renal or ureteral calculi are noted. Foley catheter is noted within the urinary bladder. Stomach/Bowel: The  stomach appears normal. There is no evidence of bowel obstruction. Stool is noted throughout the colon. The appendix is not visualized. Vascular/Lymphatic: No significant vascular findings are present. No enlarged abdominal or pelvic lymph nodes. Reproductive:  Prostate is unremarkable. Other: No abdominal wall hernia or abnormality. No abdominopelvic ascites. Musculoskeletal: Multiple lucencies are noted throughout the spine and pelvis with the largest measuring 2.8 cm involving the posterior aspect of the right acetabulum. These are concerning for multiple myeloma or metastatic disease. IMPRESSION: Multiple lucencies are noted throughout the spine and pelvis concerning for metastatic disease or multiple myeloma. The largest measures 2.8 cm in the posterior aspect of the right acetabulum. No other abnormality seen in the chest, abdomen or pelvis. Electronically Signed   By: Marijo Conception, M.D.   On: 09/03/2016 14:56   Ct Chest Wo Contrast  Result Date: 09/03/2016 CLINICAL DATA:  Metastatic disease. EXAM: CT CHEST, ABDOMEN AND PELVIS WITHOUT CONTRAST TECHNIQUE: Multidetector CT imaging of the chest, abdomen and pelvis was performed following the standard protocol without IV contrast. COMPARISON:  Ultrasound of same day.  MRI of September 02, 2016. FINDINGS: CT CHEST FINDINGS Cardiovascular: No significant vascular findings. Normal heart size. No pericardial effusion. Mediastinum/Nodes: No enlarged mediastinal, hilar, or axillary lymph nodes. Thyroid gland, trachea, and esophagus demonstrate no significant findings. Lungs/Pleura: Lungs are clear. No pleural effusion or pneumothorax. Musculoskeletal: There is noted lucency involving the body and posterior elements of T5 consistent with malignancy. Multiple other smaller lucencies are noted throughout the thoracic spine, also concerning for metastatic disease or multiple myeloma. Lucency is also noted within the manubrium suggesting lesion as well. CT ABDOMEN PELVIS  FINDINGS Hepatobiliary: No focal liver abnormality is seen. No gallstones, gallbladder wall thickening, or biliary dilatation. Pancreas: Unremarkable. No pancreatic ductal dilatation or surrounding inflammatory changes. Spleen: Normal in size without focal abnormality. Adrenals/Urinary Tract: Adrenal glands appear normal. No hydronephrosis or renal obstruction is noted. No renal or ureteral calculi are noted. Foley catheter is noted within the urinary bladder. Stomach/Bowel: The stomach appears normal. There is no evidence of bowel obstruction. Stool is noted throughout the colon. The appendix is not visualized. Vascular/Lymphatic: No significant vascular findings are present. No enlarged abdominal or pelvic lymph nodes. Reproductive: Prostate is unremarkable. Other: No abdominal wall hernia or abnormality. No abdominopelvic ascites. Musculoskeletal: Multiple lucencies are noted throughout the spine and pelvis with the largest measuring 2.8 cm involving the posterior aspect of the right acetabulum. These are concerning for multiple myeloma or metastatic disease. IMPRESSION: Multiple lucencies are noted throughout the spine and pelvis concerning for metastatic disease or multiple myeloma. The largest measures 2.8 cm in the posterior aspect of the right acetabulum. No other abnormality seen in the chest, abdomen or pelvis. Electronically Signed   By: Marijo Conception, M.D.   On: 09/03/2016 14:56   [place] Wo Or W Contrast  Result Date: 09/04/2016 CLINICAL DATA:  Request for preop thoracic spinal localization by CT. Request was made to place a marker at the T5 pedicular level. EXAM: CT THORACIC SPINE WITHOUT CONTRAST TECHNIQUE: Multidetector CT images of the thoracic were obtained using the standard protocol without intravenous contrast. COMPARISON:  Chest CT from yesterday.  Thoracic spine MRI 09/02/2016 FINDINGS: Plan was discussed with Dr. Cyndy Freeze via telephone to ensure the correct level was selected. The  patient was placed prone on the table with arms up. A mini CT of the thoracic spine was performed based on a AP and lateral scanogram. CT was performed with radiopaque grid in place. The T5 pedicular level was localized and BBs placed using table position and laser level. The grid was removed and another mini CT was performed to confirm positioning. Two BBs were placed,  1 over each of the bilateral T5 pedicles. In case the sticker moved during transport, they were outlined with a skin marker. IMPRESSION: Patient was marked prone with arms up. Two cutaneous BBs were placed, 1 over each of the eroded T5 pedicles. In case the stickers move during transport, they were outlined with a skin marker. Electronically Signed   By: Monte Fantasia M.D.   On: 09/04/2016 13:48   Scheduled Meds: . dexamethasone  4 mg Intravenous Q6H  . [START ON 09/06/2016] pantoprazole  40 mg Oral Daily  . polyethylene glycol  17 g Oral BID  . senna-docusate  1 tablet Oral BID  . sodium chloride flush  3 mL Intravenous Q12H  . tamsulosin  0.4 mg Oral Daily   Continuous Infusions: . sodium chloride 75 mL/hr at 09/03/16 1715  . sodium chloride    . lactated ringers 10 mL/hr at 09/04/16 1239    LOS: 2 days   Kerney Elbe, DO [facility] Pager [phone]  If 7PM-7AM, please contact night-coverage [email or web address] Password [email or web address] 09/05/2016, 12:03 PM

## 2016-09-06 LAB — CBC WITH DIFFERENTIAL/PLATELET
Basophils Absolute: 0 10*3/uL (ref 0.0–0.1)
Basophils Relative: 0 %
Eosinophils Absolute: 0 10*3/uL (ref 0.0–0.7)
Eosinophils Relative: 0 %
HCT: 29.3 % — ABNORMAL LOW (ref 39.0–52.0)
Hemoglobin: 9.7 g/dL — ABNORMAL LOW (ref 13.0–17.0)
Lymphocytes Relative: 10 %
Lymphs Abs: 1.2 10*3/uL (ref 0.7–4.0)
MCH: 29.9 pg (ref 26.0–34.0)
MCHC: 33.1 g/dL (ref 30.0–36.0)
MCV: 90.4 fL (ref 78.0–100.0)
Monocytes Absolute: 1 10*3/uL (ref 0.1–1.0)
Monocytes Relative: 9 %
Neutro Abs: 9.6 10*3/uL — ABNORMAL HIGH (ref 1.7–7.7)
Neutrophils Relative %: 81 %
Platelets: 244 10*3/uL (ref 150–400)
RBC: 3.24 MIL/uL — ABNORMAL LOW (ref 4.22–5.81)
RDW: 14.4 % (ref 11.5–15.5)
WBC: 11.9 10*3/uL — ABNORMAL HIGH (ref 4.0–10.5)

## 2016-09-06 LAB — COMPREHENSIVE METABOLIC PANEL
ALT: 52 U/L (ref 17–63)
AST: 57 U/L — ABNORMAL HIGH (ref 15–41)
Albumin: 3.1 g/dL — ABNORMAL LOW (ref 3.5–5.0)
Alkaline Phosphatase: 25 U/L — ABNORMAL LOW (ref 38–126)
Anion gap: 5 (ref 5–15)
BUN: 15 mg/dL (ref 6–20)
CO2: 25 mmol/L (ref 22–32)
Calcium: 8.8 mg/dL — ABNORMAL LOW (ref 8.9–10.3)
Chloride: 109 mmol/L (ref 101–111)
Creatinine, Ser: 0.98 mg/dL (ref 0.61–1.24)
GFR calc Af Amer: >60 mL/min (ref >60)
GFR calc non Af Amer: >60 mL/min (ref >60)
Glucose, Bld: 126 mg/dL — ABNORMAL HIGH (ref 65–99)
Potassium: 4.2 mmol/L (ref 3.5–5.1)
Sodium: 139 mmol/L (ref 135–145)
Total Bilirubin: 0.5 mg/dL (ref 0.3–1.2)
Total Protein: 5.3 g/dL — ABNORMAL LOW (ref 6.5–8.1)

## 2016-09-06 LAB — MAGNESIUM: Magnesium: 1.8 mg/dL (ref 1.7–2.4)

## 2016-09-06 LAB — PHOSPHORUS: Phosphorus: 2.9 mg/dL (ref 2.5–4.6)

## 2016-09-06 MED ORDER — ACETAMINOPHEN 325 MG PO TABS: 650.0000 mg | ORAL_TABLET | Freq: Four times a day (QID) | ORAL | 0 refills | Status: DC | PRN

## 2016-09-06 MED ORDER — PANTOPRAZOLE SODIUM 40 MG PO TBEC: 40.0000 mg | DELAYED_RELEASE_TABLET | Freq: Every day | ORAL | 0 refills | Status: DC

## 2016-09-06 MED ORDER — MENTHOL 3 MG MT LOZG: 1.0000 | LOZENGE | OROMUCOSAL | 12 refills | Status: DC | PRN

## 2016-09-06 MED ORDER — METHYLPREDNISOLONE 4 MG PO TBPK: ORAL_TABLET | ORAL | 0 refills | Status: DC

## 2016-09-06 MED ORDER — DIAZEPAM 5 MG PO TABS: 5.0000 mg | ORAL_TABLET | Freq: Four times a day (QID) | ORAL | 0 refills | Status: DC | PRN

## 2016-09-06 MED ORDER — POLYETHYLENE GLYCOL 3350 17 G PO PACK: 17.0000 g | PACK | Freq: Two times a day (BID) | ORAL | 0 refills | Status: DC

## 2016-09-06 MED ORDER — TAMSULOSIN HCL 0.4 MG PO CAPS: 0.4000 mg | ORAL_CAPSULE | Freq: Every day | ORAL | 0 refills | Status: DC

## 2016-09-06 MED ORDER — SENNOSIDES-DOCUSATE SODIUM 8.6-50 MG PO TABS: 1.0000 | ORAL_TABLET | Freq: Two times a day (BID) | ORAL | 0 refills | Status: DC

## 2016-09-06 MED ORDER — GABAPENTIN 300 MG PO CAPS: 300.0000 mg | ORAL_CAPSULE | Freq: Three times a day (TID) | ORAL | 3 refills | Status: DC

## 2016-09-06 MED ORDER — BISACODYL 10 MG RE SUPP
10.0000 mg | Freq: Once | RECTAL | Status: AC
Start: 2016-09-06 — End: 2016-09-06
  Administered 2016-09-06: 10 mg via RECTAL
  Filled 2016-09-06: qty 1

## 2016-09-06 MED ORDER — OXYCODONE-ACETAMINOPHEN 7.5-325 MG PO TABS: 1.0000 | ORAL_TABLET | ORAL | 0 refills | Status: DC | PRN

## 2016-09-06 MED ORDER — BISACODYL 10 MG RE SUPP: 10.0000 mg | Freq: Every day | RECTAL | 0 refills | Status: DC | PRN

## 2016-09-06 NOTE — Discharge Summary (Signed)
Physician Discharge Summary  AH BOTT POE:[id number] DOB: 07-Jan-1951 DOA: 09/02/2016  PCP: Benito Mccreedy, MD  Admit date: 09/02/2016 Discharge date: 09/06/2016  Admitted From: Home  Disposition:  Home with Home Health PT/OT along with Outpt PT  Recommendations for Outpatient Follow-up:  1. Follow up with PCP in 1-2 weeks; Call Monday to schedule an appointment; PCP saw patient prior to D/C 2. Follow up with Neurosurgery Dr. Cyndy Freeze next week 3. Have PCP refer to Oncology Dr. Alen Blew after Biopsy results are known  4. Please obtain CMP/CBC, Mag, Phos in one week 5. Please follow up on the following pending results: Biopsy Results from Frozen and Permanent Analysis from Resected Epidural Mass; SPEP Results  Home Health: YES  Equipment/Devices: RW; 3-In-1, Hospital Bed    Discharge Condition: Stable CODE STATUS: FULL CODE Diet recommendation: Heart Healthy Diet  Brief/Interim Summary: Alexander Saunders a 66 y.o.malewithouta significantpast medical history who presented with complaints of lower extremity weakness. For the last 8 weeks patient has complained of pain on his left mid-lowerback that would radiate to the side of his chest /pectoral muscle. Pain continued intermittently become sharp as he notes with coughing. He is also reported progressively worsening weakness on the right leg worse than the left and reports numbness and tingling sensation. The patient is very active at baseline and works out on a daily basis. He also reports weight loss of approximately 8 pounds. He has been taking ibuprofen 800 mg since the onset of symptoms with only mild relief. It seems he initially attributed pain to muscle strain. His wife reports that he went hiking a few weeks ago but he does report being bitten by any ticks.However, last night patient had gotten up to use the bathroom and had fallen due to the right leg weakness. Since that time he also reports that he has not been able to  urinate and has had some constipation. Foley catheter was placed for urinary retention.  Neurosurgery was consulted and saw the patient for a mass of thoracic spine found on MRI. Patient underwent CT of Chest/Abd/Pelvis for further Metastatic workup which showed no abnormality in the chest, abdomen but had had multiple lucencies noted throughout the spine and pelvis concerning for metastatic disease or multiple myeloma. The largest lucency measured 2.8 cm in the posterior aspect of the right acetabulum.  Patient underwent T4-T6 Laminectomy on 09/04/16 by Dr. Cyndy Freeze and was transfused 2 units of pRBCs during the surgery. Neurosurgery saw the patient today and removed drain and patient intiated PT/OT. Patient was given a steroid taper by Neurosurgery and his Foley Catheter was removed and Flomax initiated. Case was discussed with Dr. Alen Blew of Oncology and he states will need a biopsy to confirm diagnosis before Oncology gets involved. Biopsy pathology is still pending and will have PCP/Neurosurgery follow on the results. Patient feels as if his legs are getting stronger but still has intermittent numbness and tingling but not as severe. Patient was improved and was deemed medically stable to D/C where he will follow up with PCP and Neurosurgery within the next week.   Discharge Diagnoses:  Principal Problem:   Epidural mass Active Problems:   Lower extremity weakness   Acute kidney injury (HCC)   Urinary retention   Leukocytosis   Hypercalcemia   Spinal cord lesion (HCC)  Lower Extremity Weakness 2/2 Epidural Mass with Cord Compression concern for Metastatic Disease vs Multiple Myeloma s/p T4-T6 Laminectomy POD2; improving -Acute.Patient presented with a week history of progressively worsening back  pain and lower extremity weakness. MRI reveals epidural mass with signs of metastatic disease or multiple myeloma likely causing symptoms. Patient's last colonoscopy in 2010. - Admitted to a [facility] -  Discussed with PCP Dr. Scherrie Merritts he stated last PSA results were 12/25/15 and that his PSA level was 1.6 - IV Dexamethasone 4 mg IV q6h changed to Medrol Dose Pack by Neurosurgery; C/w Pantoprazole 40 mg po Daily  - Pain Control: Oxyxcodone IR 5-10 mg po q4hprn written by Neurosurgery -Neurosurgery added Valium 5 mg q6hprn for Muscle Spasms -Added Senna-Docusate and Polyethylene Glycol 17 mg po BID - Check TSH was 0.940, ESR (20), CRP (<0.8), SPEP still pending - Obtained CT Chest/Abd/Pelvis w/o Contrast for further metastatic workup and showed Multiple lucencies are noted throughout the spine and pelvis concerning for metastatic disease or multiple myeloma. The largest measures 2.8 cm in the posterior aspect of the right acetabulum. No other abnormality seen in the chest, abdomen or pelvis. - Discussed with Dr. Alen Blew of Oncology and recommends no further workup at this time as patient is going for T4-T6 Laminectomy for Tumor Resection done by Dr. [name] Ditty; Biopsy sent and Pathology is Pending  - PT/OT to Eval and Treat; Will need Ambulatory Referral For PT and Home Health PT -Wrote for Hospital Bed as patient has stairs - Appreciated neurosurgery consultative services will follow-up for further recommendations; Patient underwent T4-T6 Laminectomy for resection of extradural thoracic spinal mass -Follow up with PCP and Neurosurgery within 1 week for biopsy results and Hospital Follow up   Acute kidney injury with Urinary Retention, improved -Baseline creatinine unknown at this time but presents with creatinine 1.6 and BUN 26.  -BUN/Cr went from 26/1.60 -> 19/1.34 -> 18/1.14 -> 16/1.02 -> 15/1.05 -> 15/0.98 -Foley catheter was inserted. Urinalysis otherwise negative.  -Suspect acute kidney injury likely related to recent use of NSAIDs along with urinary retention. - Discontinued Foley catheter 8/3 and Flomax started by Neuro Surgery - IV fluids of NS at 75 ml/hr now D/C'd; Was placed on  NS + 20 mEQ of KCL at 100 mL/hr and was instructed to Saline lock when tolerating po  - Check urine FENa (Urine Lytes still pending) - Checked Renal Ultrasound and was Negative Exam - Discontinued nephrotoxic agents including ibuprofen at D/C - Continue to Monitor and repeat CMP as an outpatient   Leukocytosis - WBC 10.6 on admission. WBC went from 10.6 -> 9.9 -> 12.4 -> 14.6 -> 14.5 -> 15.4 -> 11.9 - Suspect likely reactive to above in conjunction with IV Steroid Demargination now - Will need to wean steroids over next 5 days and was given Medrol Dose Pack by Neurosurgery - Urinalysis negative for any signs of infection. - Continue to Monitor and repeat CBC as an outpatient   Normocytic Normochromic Anemia and ABLA - After Discussion with patient's PCP, PCP was concerned about Anemia as an outpatient  and wanted to pursue Further workup for Cancer and had ordered a CT Scan; Patient stated he would do Anemia workup at the [facility] per my conversation with PCP - Hemoglobin 11 on admission with no previous baseline available - Patient was transfused 2 units of pRBC's during surgery  - Hb/Hct went from 11.0/31.7 -> 10.8/31.8 -> 10.9/31.8 -> 10.6/31.1 -> 10.4/30.5 -> 9.7/29.3 - Continue to Monitor for S/Sx of Bleeding and Repeat CBC as an outpatient   Constipation, Chronic, improved -Patient Admitted to having Chronic Constipation and that last bowel movement was 9 days ago -Changed Senna to [employer]  1 tab BID; IVF Rehydration D/C'd -Added Polyethylene Glycol BID -Given Bisacodyl 10 mg RC and improved -Patient had a large BM prior to d/c and was written for laxatives and stool softeners on D/C  Hypercalcemia, improved  - ? Hypercalcemia of Malignancy - Acute. Initial calcium 10.7 and improved to 8.8. - IV fluids now D/C'd  - Check Ionized Calcium and was slightly elevated at 5.9 -Follow up with PCP to repeat CMP  Discharge Instructions  Discharge Instructions    Ambulatory  referral to [place]    Complete by:  As directed    Please evaluate [name] for admission to [facility].  Disciplines requested: Physical Therapy  Services to provide: Strengthening Exercises  Physician to follow patient's care (the person listed here will be responsible for signing ongoing orders): Referring Provider  Requested Start of Care Date: Within 2-3 days  I certify that this patient is under my care and that I, or a Nurse Practitioner or Physician's Assistant working with me, had a face-to-face encounter that meets the physician face-to-face requirements with patient on 09/06/2016. The encounter with the patient was in whole, or in part for the following medical condition(s) which is the primary reason for home health care (List medical condition). Gait instability   Does the patient have Medicare or Medicaid?:  Yes   The encounter with the patient was in whole, or in part, for the following medical condition, which is the primary reason for home health care:  Gait instability   Reason for Medically Necessary Home Health Services:   Therapy- Home Adaptation to Facilitate Safety Therapy- Personnel officer, Public librarian Therapy- Instruction on Safe use of Assistive Devices for ADLs Therapy- Instruction on use of Assistive Device for Ambulation on all Surfaces     My clinical findings support the need for the above services:  Pain interferes with ambulation/mobility   I certify that, based on my findings, the following services are medically necessary home health services:  Physical therapy   Further, I certify that my clinical findings support that this patient is homebound due to:  Pain interferes with ambulation/mobility   Call MD for:  difficulty breathing, headache or visual disturbances    Complete by:  As directed    Call MD for:  extreme fatigue    Complete by:  As directed    Call MD for:  hives    Complete by:  As directed    Call MD for:   persistant dizziness or light-headedness    Complete by:  As directed    Call MD for:  persistant nausea and vomiting    Complete by:  As directed    Call MD for:  redness, tenderness, or signs of infection (pain, swelling, redness, odor or green/yellow discharge around incision site)    Complete by:  As directed    Call MD for:  severe uncontrolled pain    Complete by:  As directed    Call MD for:  temperature >100.4    Complete by:  As directed    Diet - low sodium heart healthy    Complete by:  As directed    Discharge instructions    Complete by:  As directed    Follow up with PCP on Monday and with Neurosurgery within the next week. Take all medications as prescribed. If symptoms change or worsen please return to the ED for evaluation.   Increase activity slowly    Complete by:  As directed    Lifting restrictions    Complete by:  As directed    Per Neurosurgery Reccs     Allergies as of 09/06/2016   No Known Allergies     Medication List    STOP taking these medications   ibuprofen 600 MG tablet Commonly known as:  ADVIL,MOTRIN     TAKE these medications   bisacodyl 10 MG suppository Commonly known as:  DULCOLAX Place 1 suppository (10 mg total) rectally daily as needed for moderate constipation.   diazepam 5 MG tablet Commonly known as:  VALIUM Take 1 tablet (5 mg total) by mouth every 6 (six) hours as needed for muscle spasms.   gabapentin 300 MG capsule Commonly known as:  NEURONTIN Take 1 capsule (300 mg total) by mouth 3 (three) times daily.   loratadine 10 MG tablet Commonly known as:  CLARITIN Take 10 mg by mouth daily as needed for allergies.   menthol-cetylpyridinium 3 MG lozenge Commonly known as:  CEPACOL Take 1 lozenge (3 mg total) by mouth as needed for sore throat (sore throat).   methylPREDNISolone 4 MG Tbpk tablet Commonly known as:  MEDROL Take according to package inserts   oxyCODONE-acetaminophen 7.5-325 MG tablet Commonly known as:   PERCOCET Take 1 tablet by mouth every 4 (four) hours as needed for severe pain.   pantoprazole 40 MG tablet Commonly known as:  PROTONIX Take 1 tablet (40 mg total) by mouth daily.   polyethylene glycol packet Commonly known as:  MIRALAX / GLYCOLAX Take 17 g by mouth 2 (two) times daily.   senna-docusate 8.6-50 MG tablet Commonly known as:  Senokot-S Take 1 tablet by mouth 2 (two) times daily.   tamsulosin 0.4 MG Caps capsule Commonly known as:  FLOMAX Take 1 capsule (0.4 mg total) by mouth daily.            Durable Medical Equipment        Start     Ordered   09/06/16 1511  DME 3-in-1  Once     09/06/16 1511   09/06/16 1502  For home use only DME 3 n 1  Once     09/06/16 1502   09/06/16 1502  For home use only DME Walker rolling  Once    Question:  Patient needs a walker to treat with the following condition  Answer:  Gait instability   09/06/16 1502     Follow-up Information    Ditty, Kevan Ny, MD. Schedule an appointment as soon as possible for a visit in 3 week(s).   Specialty:  Neurosurgery Contact information: [address] [place] [address] [phone]        Benito Mccreedy, MD. Call in 1 week(s).   Specialty:  Internal Medicine Why:  Call to schedule an appointment within 1 week Contact information: [address] [place] [address] [phone]          No Known Allergies  Consultations:  Neurosurgery Dr. Cyndy Freeze  Discussed Case with Oncology Dr. Alen Blew  Procedures/Studies: Ct Abdomen Pelvis Wo Contrast  Result Date: 09/03/2016 CLINICAL DATA:  Metastatic disease. EXAM: CT CHEST, ABDOMEN AND PELVIS WITHOUT CONTRAST TECHNIQUE: Multidetector CT imaging of the chest, abdomen and pelvis was performed following the standard protocol without IV contrast. COMPARISON:  Ultrasound of same day.  MRI of September 02, 2016. FINDINGS: CT CHEST FINDINGS Cardiovascular: No significant vascular findings. Normal heart  size. No pericardial effusion. Mediastinum/Nodes: No enlarged mediastinal, hilar, or  axillary lymph nodes. Thyroid gland, trachea, and esophagus demonstrate no significant findings. Lungs/Pleura: Lungs are clear. No pleural effusion or pneumothorax. Musculoskeletal: There is noted lucency involving the body and posterior elements of T5 consistent with malignancy. Multiple other smaller lucencies are noted throughout the thoracic spine, also concerning for metastatic disease or multiple myeloma. Lucency is also noted within the manubrium suggesting lesion as well. CT ABDOMEN PELVIS FINDINGS Hepatobiliary: No focal liver abnormality is seen. No gallstones, gallbladder wall thickening, or biliary dilatation. Pancreas: Unremarkable. No pancreatic ductal dilatation or surrounding inflammatory changes. Spleen: Normal in size without focal abnormality. Adrenals/Urinary Tract: Adrenal glands appear normal. No hydronephrosis or renal obstruction is noted. No renal or ureteral calculi are noted. Foley catheter is noted within the urinary bladder. Stomach/Bowel: The stomach appears normal. There is no evidence of bowel obstruction. Stool is noted throughout the colon. The appendix is not visualized. Vascular/Lymphatic: No significant vascular findings are present. No enlarged abdominal or pelvic lymph nodes. Reproductive: Prostate is unremarkable. Other: No abdominal wall hernia or abnormality. No abdominopelvic ascites. Musculoskeletal: Multiple lucencies are noted throughout the spine and pelvis with the largest measuring 2.8 cm involving the posterior aspect of the right acetabulum. These are concerning for multiple myeloma or metastatic disease. IMPRESSION: Multiple lucencies are noted throughout the spine and pelvis concerning for metastatic disease or multiple myeloma. The largest measures 2.8 cm in the posterior aspect of the right acetabulum. No other abnormality seen in the chest, abdomen or pelvis. Electronically  Signed   By: Marijo Conception, M.D.   On: 09/03/2016 14:56   Dg Chest 1 View  Result Date: 09/02/2016 CLINICAL DATA:  Weakness EXAM: CHEST 1 VIEW COMPARISON:  None. FINDINGS: Lungs are clear.  No pleural effusion or pneumothorax. The heart is normal in size. IMPRESSION: No evidence of acute cardiopulmonary disease. Electronically Signed   By: Julian Hy M.D.   On: 09/02/2016 22:55   Ct Chest Wo Contrast  Result Date: 09/03/2016 CLINICAL DATA:  Metastatic disease. EXAM: CT CHEST, ABDOMEN AND PELVIS WITHOUT CONTRAST TECHNIQUE: Multidetector CT imaging of the chest, abdomen and pelvis was performed following the standard protocol without IV contrast. COMPARISON:  Ultrasound of same day.  MRI of September 02, 2016. FINDINGS: CT CHEST FINDINGS Cardiovascular: No significant vascular findings. Normal heart size. No pericardial effusion. Mediastinum/Nodes: No enlarged mediastinal, hilar, or axillary lymph nodes. Thyroid gland, trachea, and esophagus demonstrate no significant findings. Lungs/Pleura: Lungs are clear. No pleural effusion or pneumothorax. Musculoskeletal: There is noted lucency involving the body and posterior elements of T5 consistent with malignancy. Multiple other smaller lucencies are noted throughout the thoracic spine, also concerning for metastatic disease or multiple myeloma. Lucency is also noted within the manubrium suggesting lesion as well. CT ABDOMEN PELVIS FINDINGS Hepatobiliary: No focal liver abnormality is seen. No gallstones, gallbladder wall thickening, or biliary dilatation. Pancreas: Unremarkable. No pancreatic ductal dilatation or surrounding inflammatory changes. Spleen: Normal in size without focal abnormality. Adrenals/Urinary Tract: Adrenal glands appear normal. No hydronephrosis or renal obstruction is noted. No renal or ureteral calculi are noted. Foley catheter is noted within the urinary bladder. Stomach/Bowel: The stomach appears normal. There is no evidence of bowel  obstruction. Stool is noted throughout the colon. The appendix is not visualized. Vascular/Lymphatic: No significant vascular findings are present. No enlarged abdominal or pelvic lymph nodes. Reproductive: Prostate is unremarkable. Other: No abdominal wall hernia or abnormality. No abdominopelvic ascites. Musculoskeletal: Multiple lucencies are noted throughout the spine and pelvis with the largest measuring 2.8  cm involving the posterior aspect of the right acetabulum. These are concerning for multiple myeloma or metastatic disease. IMPRESSION: Multiple lucencies are noted throughout the spine and pelvis concerning for metastatic disease or multiple myeloma. The largest measures 2.8 cm in the posterior aspect of the right acetabulum. No other abnormality seen in the chest, abdomen or pelvis. Electronically Signed   By: Marijo Conception, M.D.   On: 09/03/2016 14:56   Mr Thoracic Spine W Wo Contrast  Result Date: 09/02/2016 CLINICAL DATA:  Right leg weakness.  Difficulty urinating. EXAM: MRI THORACIC AND LUMBAR SPINE WITHOUT AND WITH CONTRAST TECHNIQUE: Multiplanar and multiecho pulse sequences of the thoracic and lumbar spine were obtained without and with intravenous contrast. CONTRAST:  35m MULTIHANCE GADOBENATE DIMEGLUMINE 529 MG/ML IV SOLN COMPARISON:  None. FINDINGS: MRI THORACIC SPINE FINDINGS Alignment:  Physiologic. Vertebrae: There is diffuse signal abnormality throughout the thoracic vertebral column bone marrow with innumerable low T1 weighted signal lesions that show enhancement following contrast administration. There is no compression fracture. There is an infiltrative T5 lesion occupying the left aspect of the vertebral body and extending into the posterior elements with a large dorsal epidural component that extends from T4-T6 and measures 1.0 x 1.4 x 6.6 cm (AP x Transverse x CC). This mass anteriorly displaces and compresses the spinal cord. Cord: There is no visible cord signal change. No  syrinx or abnormal spinal cord enhancement. Paraspinal and other soft tissues: There is abnormal enhancement of the interspinous soft tissues at the T4-T6 levels. Disc levels: As above, the dorsal epidural mass at the T4-T6 levels effaces the thecal sac and compresses the spinal cord. Otherwise, there is no thoracic spinal canal stenosis. There is mild extension of the mass into the bilateral T5-T6 neural foramina, but the foramina remain patent with mild-to-moderate stenosis. MRI LUMBAR SPINE FINDINGS Segmentation:  Standard Alignment:  Normal Vertebrae: Diffusely abnormal bone marrow signal. No compression fracture. Conus medullaris: Extends to the L1 level and appears normal. No lumbar epidural disease. No compression of the cauda equina nerve roots. Paraspinal and other soft tissues: The visualized aorta, IVC and iliac vessels are normal. The visualized retroperitoneal organs and paraspinal soft tissues are normal. Disc levels: T12-L1: Normal disc space and facets. No spinal canal or neuroforaminal stenosis. L1-L2: Small central disc protrusion without spinal canal or neural foraminal stenosis. L2-L3: Minimal disc bulge without stenosis. L3-L4: Mild disc bulge without stenosis. L4-L5: Mild bilateral facet hypertrophy with disc desiccation and small bulge. No spinal canal stenosis. Mild bilateral neural foraminal stenosis. L5-S1: Normal disc space and facets. No spinal canal or neuroforaminal stenosis. Visualized sacrum: Diffuse sacral bone marrow abnormality, as throughout the thoracic and lumbar spine. IMPRESSION: 1. Dorsal epidural mass at the T4-T6 level resulting in compression of the thoracic spinal cord. 2. Diffusely abnormal bone marrow signal and contrast enhancement, consistent with a marrow replacement process such as multiple myeloma versus diffuse osseous metastatic disease. 3. No pathologic compression fracture. Critical Value/emergent results were called by telephone at the time of interpretation  on 09/02/2016 at 8:34 pm to Dr. SFredia Sorrow who verbally acknowledged these results. Electronically Signed   By: KUlyses JarredM.D.   On: 09/02/2016 20:46   Mr Lumbar Spine W Wo Contrast  Result Date: 09/02/2016 CLINICAL DATA:  Right leg weakness.  Difficulty urinating. EXAM: MRI THORACIC AND LUMBAR SPINE WITHOUT AND WITH CONTRAST TECHNIQUE: Multiplanar and multiecho pulse sequences of the thoracic and lumbar spine were obtained without and with intravenous contrast. CONTRAST:  161m  MULTIHANCE GADOBENATE DIMEGLUMINE 529 MG/ML IV SOLN COMPARISON:  None. FINDINGS: MRI THORACIC SPINE FINDINGS Alignment:  Physiologic. Vertebrae: There is diffuse signal abnormality throughout the thoracic vertebral column bone marrow with innumerable low T1 weighted signal lesions that show enhancement following contrast administration. There is no compression fracture. There is an infiltrative T5 lesion occupying the left aspect of the vertebral body and extending into the posterior elements with a large dorsal epidural component that extends from T4-T6 and measures 1.0 x 1.4 x 6.6 cm (AP x Transverse x CC). This mass anteriorly displaces and compresses the spinal cord. Cord: There is no visible cord signal change. No syrinx or abnormal spinal cord enhancement. Paraspinal and other soft tissues: There is abnormal enhancement of the interspinous soft tissues at the T4-T6 levels. Disc levels: As above, the dorsal epidural mass at the T4-T6 levels effaces the thecal sac and compresses the spinal cord. Otherwise, there is no thoracic spinal canal stenosis. There is mild extension of the mass into the bilateral T5-T6 neural foramina, but the foramina remain patent with mild-to-moderate stenosis. MRI LUMBAR SPINE FINDINGS Segmentation:  Standard Alignment:  Normal Vertebrae: Diffusely abnormal bone marrow signal. No compression fracture. Conus medullaris: Extends to the L1 level and appears normal. No lumbar epidural disease. No  compression of the cauda equina nerve roots. Paraspinal and other soft tissues: The visualized aorta, IVC and iliac vessels are normal. The visualized retroperitoneal organs and paraspinal soft tissues are normal. Disc levels: T12-L1: Normal disc space and facets. No spinal canal or neuroforaminal stenosis. L1-L2: Small central disc protrusion without spinal canal or neural foraminal stenosis. L2-L3: Minimal disc bulge without stenosis. L3-L4: Mild disc bulge without stenosis. L4-L5: Mild bilateral facet hypertrophy with disc desiccation and small bulge. No spinal canal stenosis. Mild bilateral neural foraminal stenosis. L5-S1: Normal disc space and facets. No spinal canal or neuroforaminal stenosis. Visualized sacrum: Diffuse sacral bone marrow abnormality, as throughout the thoracic and lumbar spine. IMPRESSION: 1. Dorsal epidural mass at the T4-T6 level resulting in compression of the thoracic spinal cord. 2. Diffusely abnormal bone marrow signal and contrast enhancement, consistent with a marrow replacement process such as multiple myeloma versus diffuse osseous metastatic disease. 3. No pathologic compression fracture. Critical Value/emergent results were called by telephone at the time of interpretation on 09/02/2016 at 8:34 pm to Dr. Fredia Sorrow, who verbally acknowledged these results. Electronically Signed   By: Ulyses Jarred M.D.   On: 09/02/2016 20:46   US Renal  Result Date: 09/03/2016 CLINICAL DATA:  Acute kidney injury. EXAM: RENAL / URINARY TRACT ULTRASOUND COMPLETE COMPARISON:  None. FINDINGS: Right Kidney: Length: 12.3 cm. Echogenicity within normal limits. No mass or hydronephrosis visualized. Left Kidney: Length: 11.3 cm. Echogenicity within normal limits. No mass or hydronephrosis visualized. Bladder: Decompressed with a Foley catheter. IMPRESSION: Negative exam. Electronically Signed   By: Staci Righter M.D.   On: 09/03/2016 07:52   [place] Wo Or W Contrast  Result Date:  09/04/2016 CLINICAL DATA:  Request for preop thoracic spinal localization by CT. Request was made to place a marker at the T5 pedicular level. EXAM: CT THORACIC SPINE WITHOUT CONTRAST TECHNIQUE: Multidetector CT images of the thoracic were obtained using the standard protocol without intravenous contrast. COMPARISON:  Chest CT from yesterday.  Thoracic spine MRI 09/02/2016 FINDINGS: Plan was discussed with Dr. Cyndy Freeze via telephone to ensure the correct level was selected. The patient was placed prone on the table with arms up. A mini CT of the thoracic  spine was performed based on a AP and lateral scanogram. CT was performed with radiopaque grid in place. The T5 pedicular level was localized and BBs placed using table position and laser level. The grid was removed and another mini CT was performed to confirm positioning. Two BBs were placed, 1 over each of the bilateral T5 pedicles. In case the sticker moved during transport, they were outlined with a skin marker. IMPRESSION: Patient was marked prone with arms up. Two cutaneous BBs were placed, 1 over each of the eroded T5 pedicles. In case the stickers move during transport, they were outlined with a skin marker. Electronically Signed   By: Monte Fantasia M.D.   On: 09/04/2016 13:48     Subjective: Seen and examined at bedside and was feeling good. Had some shoulder pain but was tolerable. No CP or SOB. Ready to go home.   Discharge Exam: Vitals:   09/05/16 2216 09/06/16 0503  BP: (!) 148/69 (!) 155/89  Pulse: 65 64  Resp: 18 17  Temp: 97.6 F (36.4 C) 98 F (36.7 C)   Vitals:   09/05/16 0519 09/05/16 1456 09/05/16 2216 09/06/16 0503  BP: (!) 126/56 (!) 132/55 (!) 148/69 (!) 155/89  Pulse: (!) 58 63 65 64  Resp: '18 16 18 17  '$ Temp: 98.2 F (36.8 C) 98 F (36.7 C) 97.6 F (36.4 C) 98 F (36.7 C)  TempSrc: Oral Oral Oral Oral  SpO2: 98% 100% 100% 100%  Weight:      Height:       General: Pt is alert, awake, not in acute  distress Cardiovascular: RRR, S1/S2 +, no rubs, no gallops Respiratory: CTA bilaterally, no wheezing, no rhonchi Abdominal: Soft, NT, ND, bowel sounds + Extremities: no edema, no cyanosis; Has Mild Weakness in Right compared to Left  The results of significant diagnostics from this hospitalization (including imaging, microbiology, ancillary and laboratory) are listed below for reference.    Microbiology: Recent Results (from the past 240 hour(s))  Surgical pcr screen     Status: None   Collection Time: 09/04/16 12:17 AM  Result Value Ref Range Status   MRSA, PCR NEGATIVE NEGATIVE Final   Staphylococcus aureus NEGATIVE NEGATIVE Final    Comment:        The Xpert SA Assay (FDA approved for NASAL specimens in patients over 24 years of age), is one component of a comprehensive surveillance program.  Test performance has been validated by [facility] for patients greater than or equal to 42 year old. It is not intended to diagnose infection nor to guide or monitor treatment.      Labs: BNP (last 3 results) No results for input(s): BNP in the last 8760 hours. Basic Metabolic Panel:  Recent Labs Lab 09/03/16 0913 09/04/16 0419 09/04/16 1517 09/05/16 0357 09/05/16 1224 09/06/16 0518  NA 137 140 141 138 140 139  K 3.9 3.9 4.1 4.1 4.0 4.2  CL 105 110  --  106 105 109  CO2 24 23  --  '24 25 25  '$ GLUCOSE 148* 120* 149* 115* 112* 126*  BUN 18 16  --  '15 13 15  '$ CREATININE 1.14 1.02  --  1.05 1.03 0.98  CALCIUM 10.3 10.0  --  9.3 9.6 8.8*  MG 1.8 1.8  --  1.7  --  1.8  PHOS 3.8 3.2  --  3.1  --  2.9   Liver Function Tests:  Recent Labs Lab 09/02/16 1208 09/03/16 0913 09/04/16 0419 09/05/16 0357 09/06/16  0518  AST '24 20 26 '$ 32 57*  ALT 18 16* 22 18 52  ALKPHOS 30* 28* 26* 21* 25*  BILITOT 0.8 0.7 0.4 0.7 0.5  PROT 7.5 6.8 6.4* 5.6* 5.3*  ALBUMIN 4.6 4.1 3.7 3.5 3.1*   No results for input(s): LIPASE, AMYLASE in the last 168 hours. No results for input(s): AMMONIA  in the last 168 hours. CBC:  Recent Labs Lab 09/02/16 1208  09/03/16 0913 09/04/16 0419 09/04/16 1517 09/05/16 0357 09/05/16 1224 09/06/16 0518  WBC 10.6*  < > 12.4* 14.6*  --  14.5* 15.4* 11.9*  NEUTROABS 7.1  --  11.3* 13.0*  --  12.6*  --  9.6*  HGB 11.0*  < > 10.9* 10.6* 8.8* 10.4* 10.3* 9.7*  HCT 31.7*  < > 31.8* 31.1* 26.0* 30.5* 31.0* 29.3*  MCV 89.8  < > 91.1 91.5  --  89.7 89.6 90.4  PLT 321  < > 350 351  --  242 259 244  < > = values in this interval not displayed. Cardiac Enzymes: No results for input(s): CKTOTAL, CKMB, CKMBINDEX, TROPONINI in the last 168 hours. BNP: Invalid input(s): POCBNP CBG: No results for input(s): GLUCAP in the last 168 hours. D-Dimer No results for input(s): DDIMER in the last 72 hours. Hgb A1c No results for input(s): HGBA1C in the last 72 hours. Lipid Profile No results for input(s): CHOL, HDL, LDLCALC, TRIG, CHOLHDL, LDLDIRECT in the last 72 hours. Thyroid function studies No results for input(s): TSH, T4TOTAL, T3FREE, THYROIDAB in the last 72 hours.  Invalid input(s): FREET3 Anemia work up No results for input(s): VITAMINB12, FOLATE, FERRITIN, TIBC, IRON, RETICCTPCT in the last 72 hours. Urinalysis    Component Value Date/Time   COLORURINE YELLOW 09/02/2016 1254   APPEARANCEUR CLEAR 09/02/2016 1254   LABSPEC 1.012 09/02/2016 1254   PHURINE 5.0 09/02/2016 1254   GLUCOSEU NEGATIVE 09/02/2016 1254   HGBUR SMALL (A) 09/02/2016 1254   BILIRUBINUR NEGATIVE 09/02/2016 1254   KETONESUR NEGATIVE 09/02/2016 1254   PROTEINUR NEGATIVE 09/02/2016 1254   NITRITE NEGATIVE 09/02/2016 1254   LEUKOCYTESUR NEGATIVE 09/02/2016 1254   Sepsis Labs Invalid input(s): PROCALCITONIN,  WBC,  LACTICIDVEN Microbiology Recent Results (from the past 240 hour(s))  Surgical pcr screen     Status: None   Collection Time: 09/04/16 12:17 AM  Result Value Ref Range Status   MRSA, PCR NEGATIVE NEGATIVE Final   Staphylococcus aureus NEGATIVE NEGATIVE  Final    Comment:        The Xpert SA Assay (FDA approved for NASAL specimens in patients over 57 years of age), is one component of a comprehensive surveillance program.  Test performance has been validated by [facility] for patients greater than or equal to 64 year old. It is not intended to diagnose infection nor to guide or monitor treatment.    Time coordinating discharge: 35 minutes  SIGNED:  Kerney Elbe, DO [facility] 09/06/2016, 3:14 PM Pager [phone]  If 7PM-7AM, please contact night-coverage [email or web address] Password [email or web address]

## 2016-09-06 NOTE — Progress Notes (Signed)
Pt seen and examined.  No issues overnight. Up ambulating. Urinating without difficulties Pain well controlled Ready to go home  EXAM: Temp:  [97.6 F (36.4 C)-98 F (36.7 C)] 98 F (36.7 C) (08/04 0503) Pulse Rate:  [63-65] 64 (08/04 0503) Resp:  [16-18] 17 (08/04 0503) BP: (132-155)/(55-89) 155/89 (08/04 0503) SpO2:  [100 %] 100 % (08/04 0503) Intake/Output      08/03 0701 - 08/04 0700 08/04 0701 - 08/05 0700   P.O. 240    I.V. (mL/kg) 1776.5 (19.8)    Other 150    IV Piggyback 150    Total Intake(mL/kg) 2316.5 (25.9)    Urine (mL/kg/hr) 2600 (1.2) 400 (3.4)   Total Output 2600 400   Net -283.5 -400        Urine Occurrence 1 x     Awake and alert Follows commands throughout MAEW with good strength Wound dressing c/d/i  Plan Stable.  Ready for D/C from [place] he would benefit from [facility] PT. Order placed Prescriptions written for pain control while home Rx for medrol dose pack to wean off steroids. Will f/u Tues/Wed in office

## 2016-09-06 NOTE — Progress Notes (Signed)
Patient was discharged home by MD order; discharged instructions  review and give to patient and his wife with care notes and prescriptions; IV DIC; patient will be escorted to the car by nurse tech via wheelchair.

## 2016-09-06 NOTE — Evaluation (Signed)
Occupational Therapy Evaluation Patient Details Name: Alexander Saunders MRN: [id number] DOB: [birth year] Today's Date: 09/06/2016    History of Present Illness Mr. Heitzenrater is a 66 y/o male with no significant PMH presenting with B LE weakness. Patient also with back pain radiating into mid-low back as well as chest wall. Imaging revealing epidural mass with T4-6 laminectomy performed on 09/04/16.   Clinical Impression   PTA, pt was independent with all ADL, IADL and mobility. Pt currently requires min assist for LB ADL and functional mobility due to increased pain and unsteadiness on feet. Pt had one incident during session when his legs "gave out" during toilet transfer and had to be assisted to sit safely on toilet. Educated pt on back precautions, compensatory strategies for ADL and mobility, gradual activity progression, and pain management strategies. Also educated pt/wife on importance of using RW for all OOB mobility and to provide supervision as well. Pt will benefit from continued acute OT to maximize independence and safety with ADL and mobility. DME recommendations below. OT will continue to follow acutely.    Follow Up Recommendations  No OT follow up;Supervision/Assistance - 24 hour    Equipment Recommendations  3 in 1 bedside commode;Other (comment) (RW-2 wheeled)    Recommendations for Other Services       Precautions / Restrictions Precautions Precautions: Fall;Back Precaution Booklet Issued: Yes (comment) Precaution Comments: Pt able to recall 2/4 back precautions Restrictions Weight Bearing Restrictions: No      Mobility Bed Mobility Overal bed mobility: Needs Assistance Bed Mobility: Rolling;Sidelying to Sit;Sit to Sidelying Rolling: Min guard Sidelying to sit: Min guard     Sit to sidelying: Min assist General bed mobility comments: HOB flat and exited on R side to simulate home environment. Assist to elevate LE onto bed. Cues provided throughout for proper log  roll technique.  Transfers Overall transfer level: Needs assistance Equipment used: Rolling walker (2 wheeled) Transfers: Sit to/from Stand Sit to Stand: Min assist         General transfer comment: While pivoting to sit on commode, pt "legs gave out" and he began to descend to the floor, but was able to land partially onthe toilet with assistance from OT. Pt reported this had happened once before prior to surgery. Educated pt/wife on importance of using RW at all times and for supervision/assistance for all OOB mobility Pt noted to be more shaky and unsteady with mobility after incident and reported increased fatigue.    Balance Overall balance assessment: Needs assistance Sitting-balance support: No upper extremity supported;Feet supported Sitting balance-Leahy Scale: Fair     Standing balance support: Bilateral upper extremity supported;During functional activity Standing balance-Leahy Scale: Poor Standing balance comment: See "transfers" section of this note for details on near miss incident during session                           ADL either performed or assessed with clinical judgement   ADL Overall ADL's : Needs assistance/impaired     Grooming: Wash/dry hands;Wash/dry face;Min guard;Standing   Upper Body Bathing: Minimal assistance;Sitting       Upper Body Dressing : Set up;Sitting   Lower Body Dressing: Minimal assistance;Sit to/from stand;Cueing for compensatory techniques Lower Body Dressing Details (indicate cue type and reason): able to cross ankle-over-knee Toilet Transfer: Minimal assistance;Regular Toilet;RW;Ambulation;Cueing for safety   Toileting- Clothing Manipulation and Hygiene: Min guard;Sit to/from stand       Functional mobility during ADLs: Minimal  assistance;Rolling walker General ADL Comments: Educated pt/wife on back precautions, compensatory strategies for ADL and mobility, gradual activity progression and pain management  strategies.     Vision Baseline Vision/History: Wears glasses Wears Glasses: At all times Patient Visual Report: No change from baseline Vision Assessment?: No apparent visual deficits     Perception     Praxis      Pertinent Vitals/Pain Pain Assessment: 0-10 Pain Score: 7  Pain Location: mid-upper back & left shoulder Pain Descriptors / Indicators: Sharp Pain Intervention(s): Limited activity within patient's tolerance;Monitored during session;Repositioned;Premedicated before session     Hand Dominance Right   Extremity/Trunk Assessment Upper Extremity Assessment Upper Extremity Assessment: Generalized weakness (unable to fully assess due to pain & hemovac)   Lower Extremity Assessment Lower Extremity Assessment: Generalized weakness (numbness in bottoms of feet)   Cervical / Trunk Assessment Cervical / Trunk Assessment: Other exceptions Cervical / Trunk Exceptions: s/p thoracic back surgery   Communication Communication Communication: No difficulties   Cognition Arousal/Alertness: Awake/alert Behavior During Therapy: WFL for tasks assessed/performed Overall Cognitive Status: Within Functional Limits for tasks assessed                                     General Comments  Surgical wound dressing & hemovac intact and clean.    Exercises     Shoulder Instructions      Home Living Family/patient expects to be discharged to:: Private residence Living Arrangements: Spouse/significant other Available Help at Discharge: Family Type of Home: House Home Access: Stairs to enter Technical brewer of Steps: 6   Home Layout: Two level Alternate Level Stairs-Number of Steps: 13 Alternate Level Stairs-Rails: Right Bathroom Shower/Tub: Teacher, early years/pre: Standard     Home Equipment: None          Prior Functioning/Environment Level of Independence: Independent        Comments: retired; Patent examiner Problem  List: Decreased strength;Decreased range of motion;Decreased activity tolerance;Impaired balance (sitting and/or standing);Decreased safety awareness;Decreased knowledge of use of DME or AE;Decreased knowledge of precautions;Pain;Impaired sensation      OT Treatment/Interventions: Self-care/ADL training;Therapeutic exercise;DME and/or AE instruction;Therapeutic activities;Patient/family education;Balance training    OT Goals(Current goals can be found in the care plan section) Acute Rehab OT Goals Patient Stated Goal: go home, restore strength OT Goal Formulation: With patient Time For Goal Achievement: 09/20/16 Potential to Achieve Goals: Good ADL Goals Pt Will Transfer to Toilet: with supervision;ambulating;bedside commode (over toilet) Pt Will Perform Toileting - Clothing Manipulation and hygiene: with supervision;sit to/from stand Pt Will Perform Tub/Shower Transfer: with supervision;ambulating;shower seat;rolling walker Additional ADL Goal #1: Pt will transfer in and out of bed with supervision using log roll technique with no verbal cues. Additional ADL Goal #2: Pt will verbalize and demonstrate adherence to 4/4 back precautions with no verbal cues.  OT Frequency: Min 2X/week   Barriers to D/C:            Co-evaluation              AM-PAC PT "6 Clicks" Daily Activity     Outcome Measure                 End of Session Equipment Utilized During Treatment: Gait belt;Rolling walker Nurse Communication: Mobility status;Precautions  Activity Tolerance: Patient tolerated treatment well Patient left: in bed;with call bell/phone within reach;with family/visitor present;with  SCD's reapplied  OT Visit Diagnosis: Unsteadiness on feet (R26.81)                Time: 1836-7255 OT Time Calculation (min): 25 min Charges:  OT General Charges $OT Visit: 1 Procedure OT Evaluation $OT Eval Moderate Complexity: 1 Procedure OT Treatments $Self Care/Home Management : 8-22  mins G-Codes:     Redmond Baseman, MS, OTR/L 09/06/2016, 1:18 PM

## 2016-09-06 NOTE — Care Management Note (Signed)
Case Management Note  Patient Details  Name: Alexander Saunders MRN: [id number] Date of Birth: 04-18-1950  Subjective/Objective:                  epidural mass with T4-6 laminectomy  Action/Plan: Discharge planning Expected Discharge Date:  09/06/16               Expected Discharge Plan:  [place]  In-House Referral:     Discharge planning Services  CM Consult  Post Acute Care Choice:  Durable Medical Equipment, Home Health Choice offered to:  Patient, Spouse  DME Arranged:  3-N-1, Walker rolling, Hospital bed DME Agency:  [place]:  PT [place]:  [place]  Status of Service:  Completed, signed off  If discussed at [place] of Stay Meetings, dates discussed:    Additional Comments: CM spoke with pt and spouse of pt to offer choice of home health agency. Family chooses [facility] to render HHPT, 3n1 RW and hospital bed.  Referral called to [facility] rep, Brad with contact information for delivery of hospital bed [phone].  CM notified [facility] DME rep, reggie to please deliver the 3n1 and rolling walker to room. Dellie Catholic, RN 09/06/2016, 3:15 PM

## 2016-09-06 NOTE — Progress Notes (Signed)
Pt requires a hospital bed with gel overlay as patient is post epidural mass with T4-6 laminectomy.  Pt is unable to navigate the multiple steps he has at home to get to a bed and pt needs the head of the bed to elevate for ability to get out of bed.

## 2016-09-06 NOTE — Progress Notes (Signed)
Hemovac was removed per MD order. Patient tolerated well. Output - 200cc. Will continue to monitor.

## 2016-09-06 NOTE — Progress Notes (Signed)
Physical Therapy Treatment Patient Details Name: Alexander Saunders MRN: [id number] DOB: 04/28/1950 Today's Date: 09/06/2016    History of Present Illness Alexander Saunders is a 66 y/o male with no significant PMH presenting with B LE weakness. Patient also with back pain radiating into mid-low back as well as chest wall. Imaging revealing epidural mass with T4-6 laminectomy performed on 09/04/16.    PT Comments    Pt very motivated & agreeable to participate in PT session.  States he just received pain medication so pain is controlled at this time.  Educated on back precautions to reduce stress on back with functional mobility.  Completed supine>sit with min guard & sit>supine with min assist for LE management.  Ambulated 32' with RW with CGA for safety.  Educated patient on importance of activity at least every 2 hrs but recommended shorter bursts of activity more frequently and to progress as tolerated.       Follow Up Recommendations  Outpatient PT;DC plan and follow up therapy as arranged by surgeon     Equipment Recommendations  Rolling walker with 5" wheels    Recommendations for Other Services       Precautions / Restrictions Precautions Precautions: Fall Restrictions Weight Bearing Restrictions: No    Mobility  Bed Mobility Overal bed mobility: Needs Assistance Bed Mobility: Rolling;Sidelying to Sit;Sit to Sidelying Rolling: Min guard Sidelying to sit: Min guard     Sit to sidelying: Min assist General bed mobility comments: cues for logrolling technique to reduce stress on back; required min assist for LE's back into bed  Transfers Overall transfer level: Needs assistance Equipment used: Rolling walker (2 wheeled) Transfers: Sit to/from Stand Sit to Stand: Supervision         General transfer comment: cues for safe hand placement  Ambulation/Gait Ambulation/Gait assistance: Min guard Ambulation Distance (Feet): 80 Feet Assistive device: Rolling walker (2  wheeled) Gait Pattern/deviations: Step-through pattern;Narrow base of support;Decreased stride length     General Gait Details: guarding for safety; cues for increased hip/knee flexion during swing phase.    Stairs            Wheelchair Mobility    Modified Rankin (Stroke Patients Only)       Balance                                            Cognition Arousal/Alertness: Awake/alert Behavior During Therapy: WFL for tasks assessed/performed Overall Cognitive Status: Within Functional Limits for tasks assessed                                        Exercises      General Comments        Pertinent Vitals/Pain Pain Assessment: 0-10 Pain Score: 3  Pain Location: mid-upper back & left shoulder Pain Descriptors / Indicators: Aching;Discomfort Pain Intervention(s): Limited activity within patient's tolerance;Monitored during session;Premedicated before session;Repositioned    Home Living                      Prior Function            PT Goals (current goals can now be found in the care plan section) Acute Rehab PT Goals Patient Stated Goal: go home, restore strength PT Goal Formulation: With patient  Time For Goal Achievement: 09/12/16 Potential to Achieve Goals: Good    Frequency    Min 5X/week      PT Plan Current plan remains appropriate    Co-evaluation              AM-PAC PT "6 Clicks" Daily Activity  Outcome Measure                   End of Session Equipment Utilized During Treatment: Gait belt Activity Tolerance: Patient tolerated treatment well Patient left: in bed;with call bell/phone within reach Nurse Communication: Mobility status       Time: 8841-6606 PT Time Calculation (min) (ACUTE ONLY): 31 min  Charges:  $Gait Training: 8-22 mins $Therapeutic Activity: 8-22 mins                    G Codes:      Alexander Saunders, PTA  09/06/2016    Alexander Saunders 09/06/2016,  10:54 AM

## 2016-09-08 DIAGNOSIS — G9529 Other cord compression: Secondary | ICD-10-CM | POA: Diagnosis not present

## 2016-09-08 DIAGNOSIS — M6281 Muscle weakness (generalized): Secondary | ICD-10-CM | POA: Diagnosis not present

## 2016-09-08 DIAGNOSIS — Z4789 Encounter for other orthopedic aftercare: Secondary | ICD-10-CM | POA: Diagnosis not present

## 2016-09-08 DIAGNOSIS — R339 Retention of urine, unspecified: Secondary | ICD-10-CM | POA: Diagnosis not present

## 2016-09-08 DIAGNOSIS — D649 Anemia, unspecified: Secondary | ICD-10-CM | POA: Diagnosis not present

## 2016-09-08 DIAGNOSIS — G959 Disease of spinal cord, unspecified: Secondary | ICD-10-CM | POA: Diagnosis not present

## 2016-09-10 DIAGNOSIS — G9529 Other cord compression: Secondary | ICD-10-CM | POA: Diagnosis not present

## 2016-09-10 DIAGNOSIS — R339 Retention of urine, unspecified: Secondary | ICD-10-CM | POA: Diagnosis not present

## 2016-09-10 DIAGNOSIS — Z4789 Encounter for other orthopedic aftercare: Secondary | ICD-10-CM | POA: Diagnosis not present

## 2016-09-10 DIAGNOSIS — D649 Anemia, unspecified: Secondary | ICD-10-CM | POA: Diagnosis not present

## 2016-09-10 DIAGNOSIS — G959 Disease of spinal cord, unspecified: Secondary | ICD-10-CM | POA: Diagnosis not present

## 2016-09-10 DIAGNOSIS — M6281 Muscle weakness (generalized): Secondary | ICD-10-CM | POA: Diagnosis not present

## 2016-09-11 DIAGNOSIS — D497 Neoplasm of unspecified behavior of endocrine glands and other parts of nervous system: Secondary | ICD-10-CM | POA: Diagnosis not present

## 2016-09-12 DIAGNOSIS — G959 Disease of spinal cord, unspecified: Secondary | ICD-10-CM | POA: Diagnosis not present

## 2016-09-12 DIAGNOSIS — G9529 Other cord compression: Secondary | ICD-10-CM | POA: Diagnosis not present

## 2016-09-12 DIAGNOSIS — Z4789 Encounter for other orthopedic aftercare: Secondary | ICD-10-CM | POA: Diagnosis not present

## 2016-09-12 DIAGNOSIS — D649 Anemia, unspecified: Secondary | ICD-10-CM | POA: Diagnosis not present

## 2016-09-12 DIAGNOSIS — M6281 Muscle weakness (generalized): Secondary | ICD-10-CM | POA: Diagnosis not present

## 2016-09-12 DIAGNOSIS — R339 Retention of urine, unspecified: Secondary | ICD-10-CM | POA: Diagnosis not present

## 2016-09-15 DIAGNOSIS — G959 Disease of spinal cord, unspecified: Secondary | ICD-10-CM | POA: Diagnosis not present

## 2016-09-15 DIAGNOSIS — R339 Retention of urine, unspecified: Secondary | ICD-10-CM | POA: Diagnosis not present

## 2016-09-15 DIAGNOSIS — Z4789 Encounter for other orthopedic aftercare: Secondary | ICD-10-CM | POA: Diagnosis not present

## 2016-09-15 DIAGNOSIS — D649 Anemia, unspecified: Secondary | ICD-10-CM | POA: Diagnosis not present

## 2016-09-15 DIAGNOSIS — M6281 Muscle weakness (generalized): Secondary | ICD-10-CM | POA: Diagnosis not present

## 2016-09-15 DIAGNOSIS — G9529 Other cord compression: Secondary | ICD-10-CM | POA: Diagnosis not present

## 2016-09-16 ENCOUNTER — Telehealth: Payer: Self-pay | Admitting: Oncology

## 2016-09-16 DIAGNOSIS — R339 Retention of urine, unspecified: Secondary | ICD-10-CM | POA: Diagnosis not present

## 2016-09-16 DIAGNOSIS — G959 Disease of spinal cord, unspecified: Secondary | ICD-10-CM | POA: Diagnosis not present

## 2016-09-16 DIAGNOSIS — D649 Anemia, unspecified: Secondary | ICD-10-CM | POA: Diagnosis not present

## 2016-09-16 DIAGNOSIS — G9529 Other cord compression: Secondary | ICD-10-CM | POA: Diagnosis not present

## 2016-09-16 DIAGNOSIS — M6281 Muscle weakness (generalized): Secondary | ICD-10-CM | POA: Diagnosis not present

## 2016-09-16 DIAGNOSIS — Z4789 Encounter for other orthopedic aftercare: Secondary | ICD-10-CM | POA: Diagnosis not present

## 2016-09-16 NOTE — Telephone Encounter (Signed)
Appt has been scheduled for the pt to see Dr. Alen Blew on 8/16 at 2pm. Pt aware to arrive 30 minutes early.

## 2016-09-17 DIAGNOSIS — G9520 Unspecified cord compression: Secondary | ICD-10-CM | POA: Diagnosis not present

## 2016-09-17 DIAGNOSIS — D497 Neoplasm of unspecified behavior of endocrine glands and other parts of nervous system: Secondary | ICD-10-CM | POA: Diagnosis not present

## 2016-09-18 ENCOUNTER — Ambulatory Visit (HOSPITAL_BASED_OUTPATIENT_CLINIC_OR_DEPARTMENT_OTHER): Payer: Medicare Other | Admitting: Oncology

## 2016-09-18 ENCOUNTER — Telehealth: Payer: Self-pay | Admitting: Oncology

## 2016-09-18 VITALS — BP 148/74 | HR 92 | Temp 98.2°F | Resp 18 | Ht 72.0 in | Wt 199.3 lb

## 2016-09-18 DIAGNOSIS — C9000 Multiple myeloma not having achieved remission: Secondary | ICD-10-CM | POA: Diagnosis not present

## 2016-09-18 DIAGNOSIS — D649 Anemia, unspecified: Secondary | ICD-10-CM | POA: Diagnosis not present

## 2016-09-18 NOTE — Telephone Encounter (Signed)
Scheduled appt per 8/16 los - Gave patient AVS and calender per los - Rad onc referral scheduled per [place] in radiation scheduling

## 2016-09-18 NOTE — Progress Notes (Signed)
Reason for Referral: Plasma cell disorder.   HPI: Alexander Saunders is a pleasant 66 year old gentleman native of [place] but currently resides in this area. His gentleman with excellent health without any significant comorbid conditions hospitalized on 09/02/2016 after symptoms of weakness and back pain. His evaluation included MRI of the thoracic spine which showed a dorsal epidural tumor around T4-T6 resulting in spinal cord compression. The MRI obtained at the time also showed a diffuse bone marrow signal abnormality consistent with a process such as multiple myeloma. No pathological fractures was noted. CT scan of the chest abdomen and pelvis showed multiple lucencies noted throughout the spine and pelvis concerning for metastatic disease or multiple myeloma. The largest is measuring 2.8 cm posterior aspect of the right acetabulum. His serum protein electrophoresis did not show an M spike. Although there is no light chains tested at that time.  Given these findings, he underwent an urgent T4-T6 thoracic laminectomy and resection of an extradural tumor. He tolerated the procedure very well and the final pathology showed plasma cell neoplasm. He was discharged on 09/06/2016 and currently receiving physical therapy at home. He reports his strength is returning back to normal and his lower extremities although he does have some numbness in his left leg. He does have some pain around the incision site and at times around his shoulders. He does take Percocet one to 2 a day and this has been able to manage his pain.  He does not report any headaches, blurry vision, syncope or seizures. He does not report any fevers, chills, sweats or weight loss. His appetite has been excellent and have gained weight on steroids. He does not report any chest pain, palpitation, orthopnea or leg edema. He does not report any cough, wheezing or hemoptysis. He does not report an disoriented exertion. He does not report any nausea,  vomiting, abdominal pain, constipation, diarrhea or hematochezia. He does not report any frequency, urgency or loss of bladder control. He does not report any lymphadenopathy or petechiae. He does not report any pathological fractures. Remaining review of systems unremarkable.    No past medical history for hypertension, diabetes or malignancy.   Past Surgical History:  Procedure Laterality Date  . APPENDECTOMY    . KNEE ARTHROSCOPY     r/knee  . LAMINECTOMY N/A 09/04/2016   Procedure: THORACIC FOUR-SIX LAMINECTOMY FOR RESECTION OF TUMOR;  Surgeon: Ditty, Kevan Ny, MD;  Location: [facility];  Service: Neurosurgery;  Laterality: N/A;  :   Current Outpatient Prescriptions:  .  bisacodyl (DULCOLAX) 10 MG suppository, Place 1 suppository (10 mg total) rectally daily as needed for moderate constipation., Disp: 12 suppository, Rfl: 0 .  diazepam (VALIUM) 5 MG tablet, Take 1 tablet (5 mg total) by mouth every 6 (six) hours as needed for muscle spasms., Disp: 30 tablet, Rfl: 0 .  gabapentin (NEURONTIN) 300 MG capsule, Take 1 capsule (300 mg total) by mouth 3 (three) times daily., Disp: 90 capsule, Rfl: 3 .  loratadine (CLARITIN) 10 MG tablet, Take 10 mg by mouth daily as needed for allergies., Disp: , Rfl:  .  menthol-cetylpyridinium (CEPACOL) 3 MG lozenge, Take 1 lozenge (3 mg total) by mouth as needed for sore throat (sore throat)., Disp: 100 tablet, Rfl: 12 .  methylPREDNISolone (MEDROL) 4 MG TBPK tablet, Take according to package inserts, Disp: 21 tablet, Rfl: 0 .  oxyCODONE-acetaminophen (PERCOCET) 7.5-325 MG tablet, Take 1 tablet by mouth every 4 (four) hours as needed for severe pain., Disp: 60 tablet, Rfl:  0 .  pantoprazole (PROTONIX) 40 MG tablet, Take 1 tablet (40 mg total) by mouth daily., Disp: 30 tablet, Rfl: 0 .  polyethylene glycol (MIRALAX / GLYCOLAX) packet, Take 17 g by mouth 2 (two) times daily., Disp: 14 each, Rfl: 0 .  senna-docusate (SENOKOT-S) 8.6-50 MG tablet, Take 1 tablet by  mouth 2 (two) times daily., Disp: 60 tablet, Rfl: 0 .  tamsulosin (FLOMAX) 0.4 MG CAPS capsule, Take 1 capsule (0.4 mg total) by mouth daily., Disp: 30 capsule, Rfl: 0:  No Known Allergies:  Family History  Problem Relation Age of Onset  . Hypertension Mother   . Cancer Mother   . Hypertension Father   :  Social History   Social History  . Marital status: Married    Spouse name: N/A  . Number of children: N/A  . Years of education: N/A   Occupational History  . Not on file.   Social History Main Topics  . Smoking status: Never Smoker  . Smokeless tobacco: Never Used  . Alcohol use Yes     Comment: occ  . Drug use: Unknown  . Sexual activity: Not on file   Other Topics Concern  . Not on file   Social History Narrative  . No narrative on file  :  Pertinent items are noted in HPI.  Exam: Blood pressure (!) 148/74, pulse 92, temperature 98.2 F (36.8 C), temperature source Oral, resp. rate 18, height 6' (1.829 m), weight 199 lb 4.8 oz (90.4 kg), SpO2 100 %. General appearance: alert and cooperative Throat: lips, mucosa, and tongue normal; teeth and gums normal Neck: no adenopathy Back: negative Resp: clear to auscultation bilaterally Chest wall: no tenderness Cardio: regular rate and rhythm, S1, S2 normal, no murmur, click, rub or gallop GI: soft, non-tender; bowel sounds normal; no masses,  no organomegaly Extremities: extremities normal, atraumatic, no cyanosis or edema Pulses: 2+ and symmetric  CBC    Component Value Date/Time   WBC 11.9 (H) 09/06/2016 0518   RBC 3.24 (L) 09/06/2016 0518   HGB 9.7 (L) 09/06/2016 0518   HCT 29.3 (L) 09/06/2016 0518   PLT 244 09/06/2016 0518   MCV 90.4 09/06/2016 0518   MCH 29.9 09/06/2016 0518   MCHC 33.1 09/06/2016 0518   RDW 14.4 09/06/2016 0518   LYMPHSABS 1.2 09/06/2016 0518   MONOABS 1.0 09/06/2016 0518   EOSABS 0.0 09/06/2016 0518   BASOSABS 0.0 09/06/2016 0518      Chemistry      Component Value Date/Time    NA 139 09/06/2016 0518   K 4.2 09/06/2016 0518   CL 109 09/06/2016 0518   CO2 25 09/06/2016 0518   BUN 15 09/06/2016 0518   CREATININE 0.98 09/06/2016 0518      Component Value Date/Time   CALCIUM 8.8 (L) 09/06/2016 0518   ALKPHOS 25 (L) 09/06/2016 0518   AST 57 (H) 09/06/2016 0518   ALT 52 09/06/2016 0518   BILITOT 0.5 09/06/2016 0518      Ct Chest Wo Contrast  Result Date: 09/03/2016 CLINICAL DATA:  Metastatic disease. EXAM: CT CHEST, ABDOMEN AND PELVIS WITHOUT CONTRAST TECHNIQUE: Multidetector CT imaging of the chest, abdomen and pelvis was performed following the standard protocol without IV contrast. COMPARISON:  Ultrasound of same day.  MRI of September 02, 2016. FINDINGS: CT CHEST FINDINGS Cardiovascular: No significant vascular findings. Normal heart size. No pericardial effusion. Mediastinum/Nodes: No enlarged mediastinal, hilar, or axillary lymph nodes. Thyroid gland, trachea, and esophagus demonstrate no significant findings. Lungs/Pleura:  Lungs are clear. No pleural effusion or pneumothorax. Musculoskeletal: There is noted lucency involving the body and posterior elements of T5 consistent with malignancy. Multiple other smaller lucencies are noted throughout the thoracic spine, also concerning for metastatic disease or multiple myeloma. Lucency is also noted within the manubrium suggesting lesion as well. CT ABDOMEN PELVIS FINDINGS Hepatobiliary: No focal liver abnormality is seen. No gallstones, gallbladder wall thickening, or biliary dilatation. Pancreas: Unremarkable. No pancreatic ductal dilatation or surrounding inflammatory changes. Spleen: Normal in size without focal abnormality. Adrenals/Urinary Tract: Adrenal glands appear normal. No hydronephrosis or renal obstruction is noted. No renal or ureteral calculi are noted. Foley catheter is noted within the urinary bladder. Stomach/Bowel: The stomach appears normal. There is no evidence of bowel obstruction. Stool is noted throughout  the colon. The appendix is not visualized. Vascular/Lymphatic: No significant vascular findings are present. No enlarged abdominal or pelvic lymph nodes. Reproductive: Prostate is unremarkable. Other: No abdominal wall hernia or abnormality. No abdominopelvic ascites. Musculoskeletal: Multiple lucencies are noted throughout the spine and pelvis with the largest measuring 2.8 cm involving the posterior aspect of the right acetabulum. These are concerning for multiple myeloma or metastatic disease. IMPRESSION: Multiple lucencies are noted throughout the spine and pelvis concerning for metastatic disease or multiple myeloma. The largest measures 2.8 cm in the posterior aspect of the right acetabulum. No other abnormality seen in the chest, abdomen or pelvis. Electronically Signed   By: Marijo Conception, M.D.   On: 09/03/2016 14:56   Mr Thoracic Spine W Wo Contrast  Result Date: 09/02/2016 CLINICAL DATA:  Right leg weakness.  Difficulty urinating. EXAM: MRI THORACIC AND LUMBAR SPINE WITHOUT AND WITH CONTRAST TECHNIQUE: Multiplanar and multiecho pulse sequences of the thoracic and lumbar spine were obtained without and with intravenous contrast. CONTRAST:  42m MULTIHANCE GADOBENATE DIMEGLUMINE 529 MG/ML IV SOLN COMPARISON:  None. FINDINGS: MRI THORACIC SPINE FINDINGS Alignment:  Physiologic. Vertebrae: There is diffuse signal abnormality throughout the thoracic vertebral column bone marrow with innumerable low T1 weighted signal lesions that show enhancement following contrast administration. There is no compression fracture. There is an infiltrative T5 lesion occupying the left aspect of the vertebral body and extending into the posterior elements with a large dorsal epidural component that extends from T4-T6 and measures 1.0 x 1.4 x 6.6 cm (AP x Transverse x CC). This mass anteriorly displaces and compresses the spinal cord. Cord: There is no visible cord signal change. No syrinx or abnormal spinal cord  enhancement. Paraspinal and other soft tissues: There is abnormal enhancement of the interspinous soft tissues at the T4-T6 levels. Disc levels: As above, the dorsal epidural mass at the T4-T6 levels effaces the thecal sac and compresses the spinal cord. Otherwise, there is no thoracic spinal canal stenosis. There is mild extension of the mass into the bilateral T5-T6 neural foramina, but the foramina remain patent with mild-to-moderate stenosis. MRI LUMBAR SPINE FINDINGS Segmentation:  Standard Alignment:  Normal Vertebrae: Diffusely abnormal bone marrow signal. No compression fracture. Conus medullaris: Extends to the L1 level and appears normal. No lumbar epidural disease. No compression of the cauda equina nerve roots. Paraspinal and other soft tissues: The visualized aorta, IVC and iliac vessels are normal. The visualized retroperitoneal organs and paraspinal soft tissues are normal. Disc levels: T12-L1: Normal disc space and facets. No spinal canal or neuroforaminal stenosis. L1-L2: Small central disc protrusion without spinal canal or neural foraminal stenosis. L2-L3: Minimal disc bulge without stenosis. L3-L4: Mild disc bulge without stenosis. L4-L5: Mild  bilateral facet hypertrophy with disc desiccation and small bulge. No spinal canal stenosis. Mild bilateral neural foraminal stenosis. L5-S1: Normal disc space and facets. No spinal canal or neuroforaminal stenosis. Visualized sacrum: Diffuse sacral bone marrow abnormality, as throughout the thoracic and lumbar spine. IMPRESSION: 1. Dorsal epidural mass at the T4-T6 level resulting in compression of the thoracic spinal cord. 2. Diffusely abnormal bone marrow signal and contrast enhancement, consistent with a marrow replacement process such as multiple myeloma versus diffuse osseous metastatic disease. 3. No pathologic compression fracture. Critical Value/emergent results were called by telephone at the time of interpretation on 09/02/2016 at 8:34 pm to Dr.  Fredia Sorrow, who verbally acknowledged these results. Electronically Signed   By: Ulyses Jarred M.D.   On: 09/02/2016 20:46    Assessment and Plan:    66 year old gentleman with the following issues:  1. Plasma cell neoplasm diagnosed and July 2018. He presented with lower extremity weakness and found to have a epidural mass around T4-T6 that required urgent surgical decompression on 09/04/2016. The follow-up pathology confirmed the presence of a plasma cell neoplasm. CT scan of the chest abdomen and pelvis showed no solid tumor malignancy but diffuse bone involvement including a 2.8 cm in the posterior aspect of the right acetabulum.  The natural course of this disease was discussed today in detail with the patient and his family. He appears to have multiple myeloma rather than isolated plasmacytoma. Given the diffuse nature of his bony disease this needs to be treated as multiple myeloma. To complete the staging she will require a bone marrow biopsy as well as molecular testing including cytogenetics and FISH for myeloma panel. He will also need a repeat serum protein electrophoresis, quantitative immunoglobulins and immunofixation as well as serum light chains.  I explained to the patient and his family today that he will require multiagent systemic therapy that will likely consist of Velcade, dexamethasone and Cytoxan. Complications associated with this regimen include nausea, fatigue, neuropathy, myelosuppression among others. The benefit would be to achieve complete remission. Once he accomplishes remission which is potentially might take 3-6 months of weekly therapy, he will require consolidation. Consolidation therapy will be in the form of maintenance Velcade versus stem cell transplant.  The logistics of high-dose chemotherapy and autologous stem cell transplant was discussed today with the patient. Despite his age, he is in excellent health and shape and might be a candidate for this  treatment. I will send him for an evaluation once he commences all systemic therapy at that time.  Systemic therapy will start in the near future once his staging workup is complete.  2. Epidural tumor compression: He status post surgical decompression accomplished on 09/04/2016. I will refer her for radiation oncology evaluation for possible additional therapy as well as evaluation for his 2.8 cm posterior aspect of the right acetabulum. He might require palliative radiation to that area as well.  3. Bone health: He will be a good candidate for Zometa treatment on a monthly basis. Risks and benefits of this therapy was discussed. Complications includes hypocalcemia and osteonecrosis of the jaw were reviewed. He will start that in the near future.  4. Bone pain: Manageable at this time with Percocet and I anticipate improvement once his anticancer treatment is started.  5. Renal function available once: His kidney function is normal at this time.  6. Anemia: Very mild at this time and likely related to plasma cell disorder. He does not require any transfusion or growth factor support.  7. Prognosis: He has a serious malignancy that is life-threatening and potentially incurable. But he has an excellent performance status and this is a treatable malignancy and achieving long-term remission would be the goal.  8. Follow-up: Next few weeks to follow his progress.

## 2016-09-19 DIAGNOSIS — R339 Retention of urine, unspecified: Secondary | ICD-10-CM | POA: Diagnosis not present

## 2016-09-19 DIAGNOSIS — M6281 Muscle weakness (generalized): Secondary | ICD-10-CM | POA: Diagnosis not present

## 2016-09-19 DIAGNOSIS — G9529 Other cord compression: Secondary | ICD-10-CM | POA: Diagnosis not present

## 2016-09-19 DIAGNOSIS — G959 Disease of spinal cord, unspecified: Secondary | ICD-10-CM | POA: Diagnosis not present

## 2016-09-19 DIAGNOSIS — Z4789 Encounter for other orthopedic aftercare: Secondary | ICD-10-CM | POA: Diagnosis not present

## 2016-09-19 DIAGNOSIS — D649 Anemia, unspecified: Secondary | ICD-10-CM | POA: Diagnosis not present

## 2016-09-19 NOTE — Addendum Note (Signed)
Addended by: Wyatt Portela on: 09/19/2016 07:34 AM   Modules accepted: Orders

## 2016-09-23 DIAGNOSIS — G9529 Other cord compression: Secondary | ICD-10-CM | POA: Diagnosis not present

## 2016-09-23 DIAGNOSIS — M6281 Muscle weakness (generalized): Secondary | ICD-10-CM | POA: Diagnosis not present

## 2016-09-23 DIAGNOSIS — D649 Anemia, unspecified: Secondary | ICD-10-CM | POA: Diagnosis not present

## 2016-09-23 DIAGNOSIS — R339 Retention of urine, unspecified: Secondary | ICD-10-CM | POA: Diagnosis not present

## 2016-09-23 DIAGNOSIS — Z4789 Encounter for other orthopedic aftercare: Secondary | ICD-10-CM | POA: Diagnosis not present

## 2016-09-23 DIAGNOSIS — G959 Disease of spinal cord, unspecified: Secondary | ICD-10-CM | POA: Diagnosis not present

## 2016-09-23 NOTE — Progress Notes (Addendum)
Histology and Location of Primary Cancer: Multiple myeloma  Sites of Visceral and Bony Metastatic Disease:  Diagnosis 09/04/2016: Laminectomy Dr. Harriette Ohara  1. Bone, resection, Thoracic Five Spinous Process - FINDINGS CONSISTENT WITH PLASMA CELL NEOPLASM - SEE COMMENT. 2. Bone, resection, Thoracic Epidural Tumor - FINDINGS CONSISTENT WITH PLASMA CELL NEOPLASM. - SEE COMMENT. 3. Bone, resection, Thoracic Epidural Tumor - FINDINGS CONSISTENT WITH PLASMA CELL NEOPLASM Location(s) of Symptomatic Metastases: T4-T6   Past/Anticipated chemotherapy by medical oncology, if any: Dr. Corrin Parker 09/18/16, referralradiation to T4-T6 and possible palliative radiation to right acetabulum, to start Zometa near future  IMPRESSION:CT chest/abd/pelvis: 09/03/16 Multiple lucencies are noted throughout the spine and pelvis concerning for metastatic disease or multiple myeloma. The largest measures 2.8 cm in the posterior aspect of the right acetabulum.  No other abnormality seen in the chest, abdomen or pelvis.  Pain on a scale of 0-10 is: backpain 2/10, mid back and left rib side   Dr. Cyndy Freeze  Follow up today at 2pm  If Spine Met(s), symptoms, if any, include:no  Bowel/Bladder retention or incontinence :constipation, but takes prune juice and herbal tea helps, bladder normal  Numbness or weakness in extremities: yes both legs, Current Decadron regimen, if applicable: No just completed prednisone pack  Ambulatory status? Walker? Wheelchair?: walker ,gait instability   SAFETY ISSUES:Yes weakness, getting Physical Therapy at home   Prior radiation? NO  Pacemaker/ICD? NO  Is the patient on methotrexate? NO  Current Complaints / other details:  Married, 2 children, , only alcohol use, no tobacco products   Mother cancer, lung cancer, smoker, ,HTN, Father HTN, pre cancer cells prostate, cryogenic trial  Allergies:NKA

## 2016-09-24 DIAGNOSIS — G959 Disease of spinal cord, unspecified: Secondary | ICD-10-CM | POA: Diagnosis not present

## 2016-09-24 DIAGNOSIS — R339 Retention of urine, unspecified: Secondary | ICD-10-CM | POA: Diagnosis not present

## 2016-09-24 DIAGNOSIS — G9529 Other cord compression: Secondary | ICD-10-CM | POA: Diagnosis not present

## 2016-09-24 DIAGNOSIS — M6281 Muscle weakness (generalized): Secondary | ICD-10-CM | POA: Diagnosis not present

## 2016-09-24 DIAGNOSIS — Z4789 Encounter for other orthopedic aftercare: Secondary | ICD-10-CM | POA: Diagnosis not present

## 2016-09-24 DIAGNOSIS — D649 Anemia, unspecified: Secondary | ICD-10-CM | POA: Diagnosis not present

## 2016-09-25 ENCOUNTER — Ambulatory Visit
Admission: RE | Admit: 2016-09-25 | Discharge: 2016-09-25 | Disposition: A | Discharge status: Home / Self Care | Payer: Medicare Other | Source: Ambulatory Visit | Attending: Radiation Oncology | Admitting: Radiation Oncology

## 2016-09-25 ENCOUNTER — Encounter: Payer: Self-pay | Admitting: Radiation Oncology

## 2016-09-25 VITALS — BP 124/72 | HR 71 | Temp 88.0°F | Resp 20 | Ht 72.25 in | Wt 198.6 lb

## 2016-09-25 DIAGNOSIS — C7951 Secondary malignant neoplasm of bone: Secondary | ICD-10-CM

## 2016-09-25 DIAGNOSIS — G959 Disease of spinal cord, unspecified: Secondary | ICD-10-CM

## 2016-09-25 DIAGNOSIS — Z809 Family history of malignant neoplasm, unspecified: Secondary | ICD-10-CM | POA: Diagnosis not present

## 2016-09-25 DIAGNOSIS — Z79899 Other long term (current) drug therapy: Secondary | ICD-10-CM | POA: Diagnosis not present

## 2016-09-25 DIAGNOSIS — R531 Weakness: Secondary | ICD-10-CM | POA: Diagnosis not present

## 2016-09-25 DIAGNOSIS — C7952 Secondary malignant neoplasm of bone marrow: Secondary | ICD-10-CM

## 2016-09-25 DIAGNOSIS — R200 Anesthesia of skin: Secondary | ICD-10-CM | POA: Diagnosis not present

## 2016-09-25 DIAGNOSIS — Z8249 Family history of ischemic heart disease and other diseases of the circulatory system: Secondary | ICD-10-CM | POA: Insufficient documentation

## 2016-09-25 DIAGNOSIS — Z510 Encounter for antineoplastic radiation therapy: Secondary | ICD-10-CM | POA: Insufficient documentation

## 2016-09-25 DIAGNOSIS — C9000 Multiple myeloma not having achieved remission: Secondary | ICD-10-CM

## 2016-09-25 HISTORY — DX: Neoplasm of unspecified behavior of other specified sites: D49.89

## 2016-09-25 NOTE — Progress Notes (Signed)
Please see the Nurse Progress Note in the MD Initial Consult Encounter for this patient. 

## 2016-09-25 NOTE — Progress Notes (Signed)
Radiation Oncology         [phone]) [phone] ________________________________  Name: Alexander Saunders        MRN: [id number]  Date of Service: 09/25/2016 DOB: 09/05/1950  [id number], [name], MD  Wyatt Portela, MD     REFERRING PHYSICIAN: Wyatt Portela, MD   DIAGNOSIS: The primary encounter diagnosis was Secondary malignant neoplasm of bone and bone marrow ([place]). Diagnoses of Multiple myeloma not having achieved remission ([place]), Spinal cord lesion ([place]), and Metastasis to bone [facility]) were also pertinent to this visit.   HISTORY OF PRESENT ILLNESS: Alexander Saunders is a 66 y.o. male seen at the request of Dr. Alen Blew for a diagnosis of a plasma cell neoplasm with diffuse bony involvement. The patient developed back pain with RLE weakness and difficulty with urination. He was seen in the ED on 09/02/16 and his workup with an MRI of the thoracic and lumbar spine revealed an infiltrative T5 lesion within the left aspect of the vertebral body extending into the posterior elements with a large dorsal epidural component extending from T4-T6 measuring 1 x 1.4 x 6.6 cm which anteriorly displaced his spinal cord. He was taken to the OR on 09/04/16 for T4-6 laminectomy and resection of tumor. His pathology revealed concerns for plasma cell neoplasm. Staging scans also revealed multiple lucencies throughout the spine and pelvis, the largest measuring 2.8 cm involvoing the posterior aspect of the right acetabulum. He's met with Dr. Alen Blew to discuss options of treatment and will begin systemic chemotherapy soon. He is seen to discuss options of radiotherapy to the surgical bed and to the right acetabulum.     PREVIOUS RADIATION THERAPY: No   PAST MEDICAL HISTORY:  Past Medical History:  Diagnosis Date  . 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       PAST SURGICAL HISTORY: Past Surgical History:  Procedure Laterality Date  . APPENDECTOMY    . KNEE ARTHROSCOPY     r/knee  . LAMINECTOMY N/A 09/04/2016   Procedure: THORACIC FOUR-SIX LAMINECTOMY FOR RESECTION OF TUMOR;  Surgeon: Ditty, Kevan Ny, MD;  Location: [place];  Service: Neurosurgery;  Laterality: N/A;     FAMILY HISTORY:  Family History  Problem Relation Age of Onset  . Hypertension Mother   . Cancer Mother   . Hypertension Father      SOCIAL HISTORY:  reports that he has never smoked. He has never used smokeless tobacco. He reports that he drinks alcohol.   ALLERGIES: Patient has no known allergies.   MEDICATIONS:  Current Outpatient Prescriptions  Medication Sig Dispense Refill  . diazepam (VALIUM) 5 MG tablet Take 1 tablet (5 mg total) by mouth every 6 (six) hours as needed for muscle spasms. 30 tablet 0  . gabapentin (NEURONTIN) 300 MG capsule Take 1 capsule (300 mg total) by mouth 3 (three) times daily. 90 capsule 3  . oxyCODONE-acetaminophen (PERCOCET) 7.5-325 MG tablet Take 1 tablet by mouth every 4 (four) hours as needed for severe pain. 60 tablet 0  . bisacodyl (DULCOLAX) 10 MG suppository Place 1 suppository (10 mg total) rectally daily as needed for moderate constipation. 12 suppository 0  . loratadine (CLARITIN) 10 MG tablet Take 10 mg by mouth daily as needed for allergies.    [name] menthol-cetylpyridinium (CEPACOL) 3 MG lozenge Take 1 lozenge (3 mg total) by mouth as needed for sore throat (sore throat). (Patient not taking: Reported on 09/25/2016) 100 tablet 12  . pantoprazole (PROTONIX) 40 MG tablet Take 1 tablet (40 mg total)  by mouth daily. (Patient not taking: Reported on 09/25/2016) 30 tablet 0  . polyethylene glycol (MIRALAX / GLYCOLAX) packet Take 17 g by mouth 2 (two) times daily. (Patient not taking: Reported on 09/25/2016) 14 each 0  . senna-docusate (SENOKOT-S) 8.6-50 MG tablet Take 1 tablet by mouth 2 (two) times daily. (Patient not taking: Reported on 09/25/2016) 60 tablet 0  . tamsulosin (FLOMAX) 0.4 MG CAPS capsule Take 1 capsule (0.4 mg total) by mouth daily. (Patient not taking: Reported on  09/25/2016) 30 capsule 0   No current facility-administered medications for this encounter.      REVIEW OF SYSTEMS: On review of systems, the patient reports that he is doing well overall since his surgery. He's noticed improvement in his RLE weakness. He's walking with a cane. He reports control of his bowel and bladder activity and denies any dysfunction. He occasionally has numbness in his feet but this is improving. He's off steroids at this time, and reports that he's not had any falls. He continues to have some discomfort at the site of his surgery and is taking narcotics prn. He denies any chest pain, shortness of breath, cough, fevers, chills, night sweats, unintended weight changes. He denies any bowel or bladder disturbances, and denies abdominal pain, nausea or vomiting. He denies any new musculoskeletal or joint aches or pains. A complete review of systems is obtained and is otherwise negative.     PHYSICAL EXAM:  Wt Readings from Last 3 Encounters:  09/25/16 198 lb 9.6 oz (90.1 kg)  09/18/16 199 lb 4.8 oz (90.4 kg)  09/04/16 197 lb 6.4 oz (89.5 kg)   Temp Readings from Last 3 Encounters:  09/25/16 (!) 88 F (31.1 C) (Oral)  09/18/16 98.2 F (36.8 C) (Oral)  09/06/16 97.9 F (36.6 C) (Oral)   BP Readings from Last 3 Encounters:  09/25/16 124/72  09/18/16 (!) 148/74  09/06/16 135/60   Pulse Readings from Last 3 Encounters:  09/25/16 71  09/18/16 92  09/06/16 67   Pain Assessment Pain Score: 2  Pain Loc: Back/10  In general this is a well appearing African American male in no acute distress. He is alert and oriented x4 and appropriate throughout the examination. HEENT reveals that the patient is normocephalic, atraumatic. EOMs are intact. PERRLA. Skin is intact without any evidence of gross lesions. Cardiovascular exam reveals a regular rate and rhythm, no clicks rubs or murmurs are auscultated. Chest is clear to auscultation bilaterally. He has a well healed midline  thorax incision consistent with recent laminectomy that appears well healed. Lymphatic assessment is performed and does not reveal any adenopathy in the cervical, supraclavicular, axillary, or inguinal chains.  Abdomen has active bowel sounds in all quadrants and is intact. The abdomen is soft, non tender, non distended. Lower extremities are negative for pretibial pitting edema, deep calf tenderness, cyanosis or clubbing. He has 4/4 strength of his lower extremiites with intact sensory perception of upper and lower, but decreased sensation at the right malleolus.    ECOG = 1  0 - Asymptomatic (Fully active, able to carry on all predisease activities without restriction)  1 - Symptomatic but completely ambulatory (Restricted in physically strenuous activity but ambulatory and able to carry out work of a light or sedentary nature. For example, light housework, office work)  2 - Symptomatic, <50% in bed during the day (Ambulatory and capable of all self care but unable to carry out any work activities. Up and about more than 50% of waking  hours)  3 - Symptomatic, >50% in bed, but not bedbound (Capable of only limited self-care, confined to bed or chair 50% or more of waking hours)  4 - Bedbound (Completely disabled. Cannot carry on any self-care. Totally confined to bed or chair)  5 - Death   Eustace Pen MM, Creech RH, Tormey DC, et al. [phone]). "Toxicity and response criteria of the [facility]". [place] Oncol. 5 (6): 649-55    LABORATORY DATA:  Lab Results  Component Value Date   WBC 11.9 (H) 09/06/2016   HGB 9.7 (L) 09/06/2016   HCT 29.3 (L) 09/06/2016   MCV 90.4 09/06/2016   PLT 244 09/06/2016   Lab Results  Component Value Date   NA 139 09/06/2016   K 4.2 09/06/2016   CL 109 09/06/2016   CO2 25 09/06/2016   Lab Results  Component Value Date   ALT 52 09/06/2016   AST 57 (H) 09/06/2016   ALKPHOS 25 (L) 09/06/2016   BILITOT 0.5 09/06/2016       RADIOGRAPHY: Ct Abdomen Pelvis Wo Contrast  Result Date: 09/03/2016 CLINICAL DATA:  Metastatic disease. EXAM: CT CHEST, ABDOMEN AND PELVIS WITHOUT CONTRAST TECHNIQUE: Multidetector CT imaging of the chest, abdomen and pelvis was performed following the standard protocol without IV contrast. COMPARISON:  Ultrasound of same day.  MRI of September 02, 2016. FINDINGS: CT CHEST FINDINGS Cardiovascular: No significant vascular findings. Normal heart size. No pericardial effusion. Mediastinum/Nodes: No enlarged mediastinal, hilar, or axillary lymph nodes. Thyroid gland, trachea, and esophagus demonstrate no significant findings. Lungs/Pleura: Lungs are clear. No pleural effusion or pneumothorax. Musculoskeletal: There is noted lucency involving the body and posterior elements of T5 consistent with malignancy. Multiple other smaller lucencies are noted throughout the thoracic spine, also concerning for metastatic disease or multiple myeloma. Lucency is also noted within the manubrium suggesting lesion as well. CT ABDOMEN PELVIS FINDINGS Hepatobiliary: No focal liver abnormality is seen. No gallstones, gallbladder wall thickening, or biliary dilatation. Pancreas: Unremarkable. No pancreatic ductal dilatation or surrounding inflammatory changes. Spleen: Normal in size without focal abnormality. Adrenals/Urinary Tract: Adrenal glands appear normal. No hydronephrosis or renal obstruction is noted. No renal or ureteral calculi are noted. Foley catheter is noted within the urinary bladder. Stomach/Bowel: The stomach appears normal. There is no evidence of bowel obstruction. Stool is noted throughout the colon. The appendix is not visualized. Vascular/Lymphatic: No significant vascular findings are present. No enlarged abdominal or pelvic lymph nodes. Reproductive: Prostate is unremarkable. Other: No abdominal wall hernia or abnormality. No abdominopelvic ascites. Musculoskeletal: Multiple lucencies are noted throughout the spine  and pelvis with the largest measuring 2.8 cm involving the posterior aspect of the right acetabulum. These are concerning for multiple myeloma or metastatic disease. IMPRESSION: Multiple lucencies are noted throughout the spine and pelvis concerning for metastatic disease or multiple myeloma. The largest measures 2.8 cm in the posterior aspect of the right acetabulum. No other abnormality seen in the chest, abdomen or pelvis. Electronically Signed   By: Marijo Conception, M.D.   On: 09/03/2016 14:56   Dg Chest 1 View  Result Date: 09/02/2016 CLINICAL DATA:  Weakness EXAM: CHEST 1 VIEW COMPARISON:  None. FINDINGS: Lungs are clear.  No pleural effusion or pneumothorax. The heart is normal in size. IMPRESSION: No evidence of acute cardiopulmonary disease. Electronically Signed   By: Julian Hy M.D.   On: 09/02/2016 22:55   Ct Chest Wo Contrast  Result Date: 09/03/2016 CLINICAL DATA:  Metastatic disease. EXAM: CT  CHEST, ABDOMEN AND PELVIS WITHOUT CONTRAST TECHNIQUE: Multidetector CT imaging of the chest, abdomen and pelvis was performed following the standard protocol without IV contrast. COMPARISON:  Ultrasound of same day.  MRI of September 02, 2016. FINDINGS: CT CHEST FINDINGS Cardiovascular: No significant vascular findings. Normal heart size. No pericardial effusion. Mediastinum/Nodes: No enlarged mediastinal, hilar, or axillary lymph nodes. Thyroid gland, trachea, and esophagus demonstrate no significant findings. Lungs/Pleura: Lungs are clear. No pleural effusion or pneumothorax. Musculoskeletal: There is noted lucency involving the body and posterior elements of T5 consistent with malignancy. Multiple other smaller lucencies are noted throughout the thoracic spine, also concerning for metastatic disease or multiple myeloma. Lucency is also noted within the manubrium suggesting lesion as well. CT ABDOMEN PELVIS FINDINGS Hepatobiliary: No focal liver abnormality is seen. No gallstones, gallbladder wall  thickening, or biliary dilatation. Pancreas: Unremarkable. No pancreatic ductal dilatation or surrounding inflammatory changes. Spleen: Normal in size without focal abnormality. Adrenals/Urinary Tract: Adrenal glands appear normal. No hydronephrosis or renal obstruction is noted. No renal or ureteral calculi are noted. Foley catheter is noted within the urinary bladder. Stomach/Bowel: The stomach appears normal. There is no evidence of bowel obstruction. Stool is noted throughout the colon. The appendix is not visualized. Vascular/Lymphatic: No significant vascular findings are present. No enlarged abdominal or pelvic lymph nodes. Reproductive: Prostate is unremarkable. Other: No abdominal wall hernia or abnormality. No abdominopelvic ascites. Musculoskeletal: Multiple lucencies are noted throughout the spine and pelvis with the largest measuring 2.8 cm involving the posterior aspect of the right acetabulum. These are concerning for multiple myeloma or metastatic disease. IMPRESSION: Multiple lucencies are noted throughout the spine and pelvis concerning for metastatic disease or multiple myeloma. The largest measures 2.8 cm in the posterior aspect of the right acetabulum. No other abnormality seen in the chest, abdomen or pelvis. Electronically Signed   By: Lupita Raider, M.D.   On: 09/03/2016 14:56   Mr Thoracic Spine W Wo Contrast  Result Date: 09/02/2016 CLINICAL DATA:  Right leg weakness.  Difficulty urinating. EXAM: MRI THORACIC AND LUMBAR SPINE WITHOUT AND WITH CONTRAST TECHNIQUE: Multiplanar and multiecho pulse sequences of the thoracic and lumbar spine were obtained without and with intravenous contrast. CONTRAST:  45mL MULTIHANCE GADOBENATE DIMEGLUMINE 529 MG/ML IV SOLN COMPARISON:  None. FINDINGS: MRI THORACIC SPINE FINDINGS Alignment:  Physiologic. Vertebrae: There is diffuse signal abnormality throughout the thoracic vertebral column bone marrow with innumerable low T1 weighted signal lesions that  show enhancement following contrast administration. There is no compression fracture. There is an infiltrative T5 lesion occupying the left aspect of the vertebral body and extending into the posterior elements with a large dorsal epidural component that extends from T4-T6 and measures 1.0 x 1.4 x 6.6 cm (AP x Transverse x CC). This mass anteriorly displaces and compresses the spinal cord. Cord: There is no visible cord signal change. No syrinx or abnormal spinal cord enhancement. Paraspinal and other soft tissues: There is abnormal enhancement of the interspinous soft tissues at the T4-T6 levels. Disc levels: As above, the dorsal epidural mass at the T4-T6 levels effaces the thecal sac and compresses the spinal cord. Otherwise, there is no thoracic spinal canal stenosis. There is mild extension of the mass into the bilateral T5-T6 neural foramina, but the foramina remain patent with mild-to-moderate stenosis. MRI LUMBAR SPINE FINDINGS Segmentation:  Standard Alignment:  Normal Vertebrae: Diffusely abnormal bone marrow signal. No compression fracture. Conus medullaris: Extends to the L1 level and appears normal. No lumbar epidural disease. No  compression of the cauda equina nerve roots. Paraspinal and other soft tissues: The visualized aorta, IVC and iliac vessels are normal. The visualized retroperitoneal organs and paraspinal soft tissues are normal. Disc levels: T12-L1: Normal disc space and facets. No spinal canal or neuroforaminal stenosis. L1-L2: Small central disc protrusion without spinal canal or neural foraminal stenosis. L2-L3: Minimal disc bulge without stenosis. L3-L4: Mild disc bulge without stenosis. L4-L5: Mild bilateral facet hypertrophy with disc desiccation and small bulge. No spinal canal stenosis. Mild bilateral neural foraminal stenosis. L5-S1: Normal disc space and facets. No spinal canal or neuroforaminal stenosis. Visualized sacrum: Diffuse sacral bone marrow abnormality, as throughout the  thoracic and lumbar spine. IMPRESSION: 1. Dorsal epidural mass at the T4-T6 level resulting in compression of the thoracic spinal cord. 2. Diffusely abnormal bone marrow signal and contrast enhancement, consistent with a marrow replacement process such as multiple myeloma versus diffuse osseous metastatic disease. 3. No pathologic compression fracture. Critical Value/emergent results were called by telephone at the time of interpretation on 09/02/2016 at 8:34 pm to Dr. Fredia Sorrow, who verbally acknowledged these results. Electronically Signed   By: Ulyses Jarred M.D.   On: 09/02/2016 20:46   Mr Lumbar Spine W Wo Contrast  Result Date: 09/02/2016 CLINICAL DATA:  Right leg weakness.  Difficulty urinating. EXAM: MRI THORACIC AND LUMBAR SPINE WITHOUT AND WITH CONTRAST TECHNIQUE: Multiplanar and multiecho pulse sequences of the thoracic and lumbar spine were obtained without and with intravenous contrast. CONTRAST:  26m MULTIHANCE GADOBENATE DIMEGLUMINE 529 MG/ML IV SOLN COMPARISON:  None. FINDINGS: MRI THORACIC SPINE FINDINGS Alignment:  Physiologic. Vertebrae: There is diffuse signal abnormality throughout the thoracic vertebral column bone marrow with innumerable low T1 weighted signal lesions that show enhancement following contrast administration. There is no compression fracture. There is an infiltrative T5 lesion occupying the left aspect of the vertebral body and extending into the posterior elements with a large dorsal epidural component that extends from T4-T6 and measures 1.0 x 1.4 x 6.6 cm (AP x Transverse x CC). This mass anteriorly displaces and compresses the spinal cord. Cord: There is no visible cord signal change. No syrinx or abnormal spinal cord enhancement. Paraspinal and other soft tissues: There is abnormal enhancement of the interspinous soft tissues at the T4-T6 levels. Disc levels: As above, the dorsal epidural mass at the T4-T6 levels effaces the thecal sac and compresses the spinal  cord. Otherwise, there is no thoracic spinal canal stenosis. There is mild extension of the mass into the bilateral T5-T6 neural foramina, but the foramina remain patent with mild-to-moderate stenosis. MRI LUMBAR SPINE FINDINGS Segmentation:  Standard Alignment:  Normal Vertebrae: Diffusely abnormal bone marrow signal. No compression fracture. Conus medullaris: Extends to the L1 level and appears normal. No lumbar epidural disease. No compression of the cauda equina nerve roots. Paraspinal and other soft tissues: The visualized aorta, IVC and iliac vessels are normal. The visualized retroperitoneal organs and paraspinal soft tissues are normal. Disc levels: T12-L1: Normal disc space and facets. No spinal canal or neuroforaminal stenosis. L1-L2: Small central disc protrusion without spinal canal or neural foraminal stenosis. L2-L3: Minimal disc bulge without stenosis. L3-L4: Mild disc bulge without stenosis. L4-L5: Mild bilateral facet hypertrophy with disc desiccation and small bulge. No spinal canal stenosis. Mild bilateral neural foraminal stenosis. L5-S1: Normal disc space and facets. No spinal canal or neuroforaminal stenosis. Visualized sacrum: Diffuse sacral bone marrow abnormality, as throughout the thoracic and lumbar spine. IMPRESSION: 1. Dorsal epidural mass at the T4-T6 level resulting in  compression of the thoracic spinal cord. 2. Diffusely abnormal bone marrow signal and contrast enhancement, consistent with a marrow replacement process such as multiple myeloma versus diffuse osseous metastatic disease. 3. No pathologic compression fracture. Critical Value/emergent results were called by telephone at the time of interpretation on 09/02/2016 at 8:34 pm to Dr. Fredia Sorrow, who verbally acknowledged these results. Electronically Signed   By: Ulyses Jarred M.D.   On: 09/02/2016 20:46   US Renal  Result Date: 09/03/2016 CLINICAL DATA:  Acute kidney injury. EXAM: RENAL / URINARY TRACT ULTRASOUND  COMPLETE COMPARISON:  None. FINDINGS: Right Kidney: Length: 12.3 cm. Echogenicity within normal limits. No mass or hydronephrosis visualized. Left Kidney: Length: 11.3 cm. Echogenicity within normal limits. No mass or hydronephrosis visualized. Bladder: Decompressed with a Foley catheter. IMPRESSION: Negative exam. Electronically Signed   By: Staci Righter M.D.   On: 09/03/2016 07:52   [place] Wo Or W Contrast  Result Date: 09/04/2016 CLINICAL DATA:  Request for preop thoracic spinal localization by CT. Request was made to place a marker at the T5 pedicular level. EXAM: CT THORACIC SPINE WITHOUT CONTRAST TECHNIQUE: Multidetector CT images of the thoracic were obtained using the standard protocol without intravenous contrast. COMPARISON:  Chest CT from yesterday.  Thoracic spine MRI 09/02/2016 FINDINGS: Plan was discussed with Dr. Cyndy Freeze via telephone to ensure the correct level was selected. The patient was placed prone on the table with arms up. A mini CT of the thoracic spine was performed based on a AP and lateral scanogram. CT was performed with radiopaque grid in place. The T5 pedicular level was localized and BBs placed using table position and laser level. The grid was removed and another mini CT was performed to confirm positioning. Two BBs were placed, 1 over each of the bilateral T5 pedicles. In case the sticker moved during transport, they were outlined with a skin marker. IMPRESSION: Patient was marked prone with arms up. Two cutaneous BBs were placed, 1 over each of the eroded T5 pedicles. In case the stickers move during transport, they were outlined with a skin marker. Electronically Signed   By: Monte Fantasia M.D.   On: 09/04/2016 13:48       IMPRESSION/PLAN: 1. Advanced Plasma cell neoplasm with bony disease. Dr. Lisbeth Renshaw discusses the pathology findings and reviews the nature of plasma cell disease. He discusses options of radiotherapy to help gain local control of each his thoracic  spine and righ acetabulum.  We discussed the risks, benefits, short, and long term effects of radiotherapy, and the patient is interested in proceeding. Dr. Lisbeth Renshaw discusses the delivery and logistics of radiotherapy and would recommend a 2 week course of therapy to both the T4-6 and right acetabular location. Written consent is obtained and placed in the chart, a copy was provided to the patient. He will simulate today if possible, or tomorrow at latest.  The above documentation reflects my direct findings during this shared patient visit. Please see the separate note by Dr. Lisbeth Renshaw on this date for the remainder of the patient's plan of care.    Carola Rhine, PAC

## 2016-09-26 ENCOUNTER — Ambulatory Visit: Payer: Medicare Other | Admitting: Radiation Oncology

## 2016-09-26 ENCOUNTER — Other Ambulatory Visit (HOSPITAL_BASED_OUTPATIENT_CLINIC_OR_DEPARTMENT_OTHER): Payer: Medicare Other

## 2016-09-26 ENCOUNTER — Ambulatory Visit (HOSPITAL_BASED_OUTPATIENT_CLINIC_OR_DEPARTMENT_OTHER): Payer: Medicare Other

## 2016-09-26 VITALS — BP 112/63 | HR 65 | Temp 97.7°F | Resp 19

## 2016-09-26 DIAGNOSIS — C7951 Secondary malignant neoplasm of bone: Secondary | ICD-10-CM | POA: Diagnosis not present

## 2016-09-26 DIAGNOSIS — Z8249 Family history of ischemic heart disease and other diseases of the circulatory system: Secondary | ICD-10-CM | POA: Diagnosis not present

## 2016-09-26 DIAGNOSIS — Z510 Encounter for antineoplastic radiation therapy: Secondary | ICD-10-CM | POA: Diagnosis not present

## 2016-09-26 DIAGNOSIS — C9000 Multiple myeloma not having achieved remission: Secondary | ICD-10-CM

## 2016-09-26 DIAGNOSIS — R200 Anesthesia of skin: Secondary | ICD-10-CM | POA: Diagnosis not present

## 2016-09-26 DIAGNOSIS — Z79899 Other long term (current) drug therapy: Secondary | ICD-10-CM | POA: Diagnosis not present

## 2016-09-26 DIAGNOSIS — R531 Weakness: Secondary | ICD-10-CM | POA: Diagnosis not present

## 2016-09-26 LAB — COMPREHENSIVE METABOLIC PANEL
ALT: 34 U/L (ref 0–55)
AST: 18 U/L (ref 5–34)
Albumin: 3.6 g/dL (ref 3.5–5.0)
Alkaline Phosphatase: 72 U/L (ref 40–150)
Anion Gap: 9 mEq/L (ref 3–11)
BUN: 12.3 mg/dL (ref 7.0–26.0)
CO2: 25 mEq/L (ref 22–29)
Calcium: 10.2 mg/dL (ref 8.4–10.4)
Chloride: 106 mEq/L (ref 98–109)
Creatinine: 1.1 mg/dL (ref 0.7–1.3)
EGFR: 80 mL/min/{1.73_m2} — ABNORMAL LOW (ref >90)
Glucose: 111 mg/dl (ref 70–140)
Potassium: 3.9 mEq/L (ref 3.5–5.1)
Sodium: 140 mEq/L (ref 136–145)
Total Bilirubin: 0.47 mg/dL (ref 0.20–1.20)
Total Protein: 6.9 g/dL (ref 6.4–8.3)

## 2016-09-26 LAB — CBC WITH DIFFERENTIAL/PLATELET
BASO%: 0.5 % (ref 0.0–2.0)
Basophils Absolute: 0 10*3/uL (ref 0.0–0.1)
EOS%: 6.3 % (ref 0.0–7.0)
Eosinophils Absolute: 0.5 10*3/uL (ref 0.0–0.5)
HCT: 33.4 % — ABNORMAL LOW (ref 38.4–49.9)
HGB: 11.4 g/dL — ABNORMAL LOW (ref 13.0–17.1)
LYMPH%: 26.8 % (ref 14.0–49.0)
MCH: 31.6 pg (ref 27.2–33.4)
MCHC: 34.2 g/dL (ref 32.0–36.0)
MCV: 92.3 fL (ref 79.3–98.0)
MONO#: 0.8 10*3/uL (ref 0.1–0.9)
MONO%: 11.5 % (ref 0.0–14.0)
NEUT#: 4 10*3/uL (ref 1.5–6.5)
NEUT%: 54.9 % (ref 39.0–75.0)
Platelets: 434 10*3/uL — ABNORMAL HIGH (ref 140–400)
RBC: 3.62 10*6/uL — ABNORMAL LOW (ref 4.20–5.82)
RDW: 14.9 % — ABNORMAL HIGH (ref 11.0–14.6)
WBC: 7.2 10*3/uL (ref 4.0–10.3)
lymph#: 1.9 10*3/uL (ref 0.9–3.3)

## 2016-09-26 MED ORDER — SODIUM CHLORIDE 0.9 % IV SOLN
Freq: Once | INTRAVENOUS | Status: AC
  Administered 2016-09-26: 08:00:00 via INTRAVENOUS

## 2016-09-26 MED ORDER — ZOLEDRONIC ACID 4 MG/100ML IV SOLN
4.0000 mg | Freq: Once | INTRAVENOUS | Status: AC
  Administered 2016-09-26: 4 mg via INTRAVENOUS
  Filled 2016-09-26: qty 100

## 2016-09-26 NOTE — Progress Notes (Signed)
  Radiation Oncology         [phone] ________________________________  Name: Alexander Saunders MRN: [id number]  Date: 09/25/2016  DOB: 11/04/1950  SIMULATION AND TREATMENT PLANNING NOTE  DIAGNOSIS:     ICD-10-CM   1. Multiple myeloma not having achieved remission ([facility]) C90.00   2. Metastasis to bone ([facility]) C79.51      Site:   1.  T3-7 spine 2. Right pelvis  NARRATIVE:  The patient was brought to the [facility].  Identity was confirmed.  All relevant records and images related to the planned course of therapy were reviewed.   Written consent to proceed with treatment was confirmed which was freely given after reviewing the details related to the planned course of therapy had been reviewed with the patient.  Then, the patient was set-up in a stable reproducible  supine position for radiation therapy.  CT images were obtained.  Surface markings were placed.    Medically necessary complex treatment device(s) for immobilization:  Customized vac lock bag.   The CT images were loaded into the planning software.  Then the target and avoidance structures were contoured.  Treatment planning then occurred.  The radiation prescription was entered and confirmed.  A total of 4 complex treatment devices were fabricated which relate to the designed radiation treatment fields: 2 fields for each of the separate target sites above . Each of these customized fields/ complex treatment devices will be used on a daily basis during the radiation course. I have requested : Isodose Plan.   PLAN:  The patient will receive 25  Gy in 10  Fractions to the T-spine and 30 gray in 10 fractions to the right pelvis.  ________________________________   Jodelle Gross, MD, PhD

## 2016-09-26 NOTE — Patient Instructions (Signed)

## 2016-09-29 ENCOUNTER — Encounter: Payer: Self-pay | Admitting: *Deleted

## 2016-09-29 ENCOUNTER — Ambulatory Visit
Admission: RE | Admit: 2016-09-29 | Discharge: 2016-09-29 | Disposition: A | Discharge status: Home / Self Care | Payer: Medicare Other | Source: Ambulatory Visit | Attending: Radiation Oncology | Admitting: Radiation Oncology

## 2016-09-29 ENCOUNTER — Ambulatory Visit (HOSPITAL_BASED_OUTPATIENT_CLINIC_OR_DEPARTMENT_OTHER): Payer: Medicare Other | Admitting: Hematology

## 2016-09-29 ENCOUNTER — Telehealth: Payer: Self-pay | Admitting: *Deleted

## 2016-09-29 ENCOUNTER — Ambulatory Visit (HOSPITAL_BASED_OUTPATIENT_CLINIC_OR_DEPARTMENT_OTHER): Payer: Medicare Other

## 2016-09-29 VITALS — BP 123/59 | HR 100 | Temp 98.3°F | Resp 18

## 2016-09-29 DIAGNOSIS — R531 Weakness: Secondary | ICD-10-CM | POA: Diagnosis not present

## 2016-09-29 DIAGNOSIS — R339 Retention of urine, unspecified: Secondary | ICD-10-CM

## 2016-09-29 DIAGNOSIS — A419 Sepsis, unspecified organism: Secondary | ICD-10-CM

## 2016-09-29 DIAGNOSIS — H1033 Unspecified acute conjunctivitis, bilateral: Secondary | ICD-10-CM

## 2016-09-29 DIAGNOSIS — C9000 Multiple myeloma not having achieved remission: Secondary | ICD-10-CM

## 2016-09-29 DIAGNOSIS — Z510 Encounter for antineoplastic radiation therapy: Secondary | ICD-10-CM | POA: Diagnosis not present

## 2016-09-29 DIAGNOSIS — G959 Disease of spinal cord, unspecified: Secondary | ICD-10-CM | POA: Diagnosis not present

## 2016-09-29 DIAGNOSIS — Z8249 Family history of ischemic heart disease and other diseases of the circulatory system: Secondary | ICD-10-CM | POA: Diagnosis not present

## 2016-09-29 DIAGNOSIS — C7951 Secondary malignant neoplasm of bone: Secondary | ICD-10-CM | POA: Diagnosis not present

## 2016-09-29 DIAGNOSIS — Z79899 Other long term (current) drug therapy: Secondary | ICD-10-CM | POA: Diagnosis not present

## 2016-09-29 DIAGNOSIS — R200 Anesthesia of skin: Secondary | ICD-10-CM | POA: Diagnosis not present

## 2016-09-29 LAB — CBC WITH DIFFERENTIAL/PLATELET
BASO%: 0.5 % (ref 0.0–2.0)
Basophils Absolute: 0.1 10*3/uL (ref 0.0–0.1)
EOS%: 0.7 % (ref 0.0–7.0)
Eosinophils Absolute: 0.1 10*3/uL (ref 0.0–0.5)
HCT: 30.4 % — ABNORMAL LOW (ref 38.4–49.9)
HGB: 10.3 g/dL — ABNORMAL LOW (ref 13.0–17.1)
LYMPH%: 4.7 % — ABNORMAL LOW (ref 14.0–49.0)
MCH: 31 pg (ref 27.2–33.4)
MCHC: 33.8 g/dL (ref 32.0–36.0)
MCV: 91.6 fL (ref 79.3–98.0)
MONO#: 1.2 10*3/uL — ABNORMAL HIGH (ref 0.1–0.9)
MONO%: 8.4 % (ref 0.0–14.0)
NEUT#: 12.7 10*3/uL — ABNORMAL HIGH (ref 1.5–6.5)
NEUT%: 85.7 % — ABNORMAL HIGH (ref 39.0–75.0)
Platelets: 370 10*3/uL (ref 140–400)
RBC: 3.32 10*6/uL — ABNORMAL LOW (ref 4.20–5.82)
RDW: 15 % — ABNORMAL HIGH (ref 11.0–14.6)
WBC: 14.8 10*3/uL — ABNORMAL HIGH (ref 4.0–10.3)
lymph#: 0.7 10*3/uL — ABNORMAL LOW (ref 0.9–3.3)

## 2016-09-29 LAB — COMPREHENSIVE METABOLIC PANEL
ALT: 32 U/L (ref 0–55)
AST: 25 U/L (ref 5–34)
Albumin: 3.5 g/dL (ref 3.5–5.0)
Alkaline Phosphatase: 62 U/L (ref 40–150)
Anion Gap: 9 mEq/L (ref 3–11)
BUN: 10.7 mg/dL (ref 7.0–26.0)
CO2: 23 mEq/L (ref 22–29)
Calcium: 9.1 mg/dL (ref 8.4–10.4)
Chloride: 107 mEq/L (ref 98–109)
Creatinine: 0.9 mg/dL (ref 0.7–1.3)
EGFR: >90 mL/min/{1.73_m2} (ref >90)
Glucose: 130 mg/dl (ref 70–140)
Potassium: 3.8 mEq/L (ref 3.5–5.1)
Sodium: 139 mEq/L (ref 136–145)
Total Bilirubin: 0.51 mg/dL (ref 0.20–1.20)
Total Protein: 6.7 g/dL (ref 6.4–8.3)

## 2016-09-29 LAB — URINALYSIS, MICROSCOPIC - CHCC
Bilirubin (Urine): NEGATIVE
Glucose: NEGATIVE mg/dL
Ketones: NEGATIVE mg/dL
Leukocyte Esterase: NEGATIVE
Nitrite: NEGATIVE
Protein: <30 mg/dL
Specific Gravity, Urine: 1.01 (ref 1.003–1.035)
Urobilinogen, UR: 0.2 mg/dL (ref 0.2–1)
pH: 6.5 (ref 4.6–8.0)

## 2016-09-29 LAB — KAPPA/LAMBDA LIGHT CHAINS
Ig Kappa Free Light Chain: 37.8 mg/L — ABNORMAL HIGH (ref 3.3–19.4)
Ig Lambda Free Light Chain: 8.9 mg/L (ref 5.7–26.3)
Kappa/Lambda FluidC Ratio: 4.25 — ABNORMAL HIGH (ref 0.26–1.65)

## 2016-09-29 MED ORDER — LEVOFLOXACIN 500 MG PO TABS: 500.0000 mg | ORAL_TABLET | Freq: Every day | ORAL | 0 refills | Status: DC

## 2016-09-29 MED ORDER — GENTAMICIN SULFATE 0.3 % OP SOLN: 2.0000 [drp] | Freq: Four times a day (QID) | OPHTHALMIC | 0 refills | Status: DC

## 2016-09-29 NOTE — Telephone Encounter (Signed)
Patient has had 1 tx spine and right hip today, patient right eye drooping and reddened, stated"I had Zometa bone infusion on Friday and this happened to me on Saturday" wants to be seen, per Bryson Ha our PA, to send to symptom mgt",called and left message for [place] Rn top call back and for patient to be seen there, vitals taken

## 2016-09-30 ENCOUNTER — Encounter: Payer: Self-pay | Admitting: Hematology

## 2016-09-30 ENCOUNTER — Ambulatory Visit
Admission: RE | Admit: 2016-09-30 | Discharge: 2016-09-30 | Disposition: A | Discharge status: Home / Self Care | Payer: Medicare Other | Source: Ambulatory Visit | Attending: Radiation Oncology | Admitting: Radiation Oncology

## 2016-09-30 DIAGNOSIS — C9000 Multiple myeloma not having achieved remission: Secondary | ICD-10-CM | POA: Diagnosis not present

## 2016-09-30 DIAGNOSIS — R339 Retention of urine, unspecified: Secondary | ICD-10-CM | POA: Diagnosis not present

## 2016-09-30 DIAGNOSIS — Z510 Encounter for antineoplastic radiation therapy: Secondary | ICD-10-CM | POA: Diagnosis not present

## 2016-09-30 DIAGNOSIS — Z79899 Other long term (current) drug therapy: Secondary | ICD-10-CM | POA: Diagnosis not present

## 2016-09-30 DIAGNOSIS — G959 Disease of spinal cord, unspecified: Secondary | ICD-10-CM | POA: Diagnosis not present

## 2016-09-30 DIAGNOSIS — Z4789 Encounter for other orthopedic aftercare: Secondary | ICD-10-CM | POA: Diagnosis not present

## 2016-09-30 DIAGNOSIS — R531 Weakness: Secondary | ICD-10-CM | POA: Diagnosis not present

## 2016-09-30 DIAGNOSIS — Z8249 Family history of ischemic heart disease and other diseases of the circulatory system: Secondary | ICD-10-CM | POA: Diagnosis not present

## 2016-09-30 DIAGNOSIS — G9529 Other cord compression: Secondary | ICD-10-CM | POA: Diagnosis not present

## 2016-09-30 DIAGNOSIS — R200 Anesthesia of skin: Secondary | ICD-10-CM | POA: Diagnosis not present

## 2016-09-30 DIAGNOSIS — M6281 Muscle weakness (generalized): Secondary | ICD-10-CM | POA: Diagnosis not present

## 2016-09-30 DIAGNOSIS — D649 Anemia, unspecified: Secondary | ICD-10-CM | POA: Diagnosis not present

## 2016-09-30 LAB — MULTIPLE MYELOMA PANEL, SERUM
Albumin SerPl Elph-Mcnc: 3.7 g/dL (ref 2.9–4.4)
Albumin/Glob SerPl: 1.5 (ref 0.7–1.7)
Alpha 1: 0.3 g/dL (ref 0.0–0.4)
Alpha2 Glob SerPl Elph-Mcnc: 0.8 g/dL (ref 0.4–1.0)
B-Globulin SerPl Elph-Mcnc: 1 g/dL (ref 0.7–1.3)
Gamma Glob SerPl Elph-Mcnc: 0.5 g/dL (ref 0.4–1.8)
Globulin, Total: 2.6 g/dL (ref 2.2–3.9)
IgA, Qn, Serum: 87 mg/dL (ref 61–437)
IgG, Qn, Serum: 741 mg/dL (ref 700–1600)
IgM, Qn, Serum: 15 mg/dL — ABNORMAL LOW (ref 20–172)
M Protein SerPl Elph-Mcnc: 0.1 g/dL — ABNORMAL HIGH
Total Protein: 6.3 g/dL (ref 6.0–8.5)

## 2016-09-30 NOTE — Progress Notes (Signed)
[facility]pt[facility] Progress Note   Alexander Saunders is managed by Dr. Zola Button  Actively treated with chemotherapy: The patient received Zometa on 09/26/2016. His is pending start of chemotherapy once radiation to T4-T6 was completed.  Current Therapy: The patient received Zometa on 09/26/2016 and had his first fraction of radiation therapy today.  Last Treated: September 26, 2016  Assessment/Plan:   Multiple myeloma not having achieved remission ([place]) - Plan: CBC with Differential, Comprehensive metabolic panel, Urinalysis with microscopic, Urine Culture, Culture, Blood, Culture, Blood  Spinal cord lesion ([place]) - Plan: CBC with Differential, Comprehensive metabolic panel, Urinalysis with microscopic, Urine Culture, Culture, Blood, Culture, Blood  Urinary retention - Plan: Urine Culture  Acute conjunctivitis of both eyes, unspecified acute conjunctivitis type - Plan: gentamicin (GARAMYCIN) 0.3 % ophthalmic solution  Sepsis, due to unspecified organism ([place]) - Plan: levofloxacin (LEVAQUIN) 500 MG tablet  A CBC, comprehensive chemistry panel, urinalysis, urine culture, and blood culture collected today. The patient was given a prescription for Levaquin given his rigors and elevated white blood count. The patient was given a prescription for Garamycin ophthalmic solution for his conjunctivitis. He will continue radiation therapy as planned. He will follow-up with Dr.Shadad on 10/09/2016.  Please see After Visit Summary for patient specific instructions.  No Follow-up on file.  Orders Placed This Encounter  Procedures  . Urine Culture  . Culture, Blood  . Culture, Blood  . CBC with Differential  . Comprehensive metabolic panel  . Urinalysis with microscopic      Subjective:   Patient ID:  Alexander Saunders is a 66 y.o. (DOB Feb 03, 1951) male.  Chief Complaint:  Chief Complaint  Patient presents with  . rednes in right eye    HPI Alexander Saunders presents to  the office today with a compliant of drooping of his right eye with injection of the sclera. According to his wife who accompanies him today, this was first noticed on Saturday morning of when his right eye appeared reddened and swollen. He reports having some mild irritation in his right eye. He reports having some blurring of his vision with extreme gazes to the right, left, up, and down. He denies any sick contacts or trauma. He denies any recent URI symptoms. The patient is a recent diagnosis of a plasma cell neoplasm. He is status post an epidural tumor decompression on 08/O2/2018. He received Zometa for 09/26/2016. He received his first fraction of radiation therapy to the T4-T6 spine today. There was concern by radiation therapy that he was having some drooping and his right eye today. The patient denies any unilateral weakness other than weakness in his right leg which he has had since diagnosed with the mass in the T4-T6 vertebral areas. This is no worse. He is having no difficulty swallowing.  He reports an episode of chills last Wednesday which resolved spontaneously. Today while he was being roomed he developed acute chills and rigors which lasted around 15 minutes. He denies fevers, chills, sweats, cough, difficulty urinating, dysuria, or constipation.  Medications: I have reviewed the patient's current medications.  Allergies: No Known Allergies  Past Medical History, Surgical history, Social history, and Family history were reviewed and updated as appropriate.   Please see review of systems for further details on the patient's review from today.   Review of Systems:  Review of Systems  Constitutional: Positive for chills. Negative for diaphoresis and fever.  HENT: Negative for congestion, sneezing and trouble swallowing.   Eyes: Positive  for pain, discharge, redness and visual disturbance. Negative for photophobia.  Respiratory: Negative for cough.   Gastrointestinal: Negative for  constipation.  Genitourinary: Negative for difficulty urinating, dysuria and urgency.  Musculoskeletal: Positive for back pain.  Neurological: Negative for weakness.    Objective:   Physical Exam:  BP (!) 123/59 (BP Location: Right Arm, Patient Position: Sitting)   Pulse 100   Temp 98.3 F (36.8 C) (Oral)   Resp 18   SpO2 99%  ECOG: 1  Physical Exam  Constitutional: No distress.  HENT:  Head: Normocephalic and atraumatic.  Right Ear: External ear normal.  Left Ear: External ear normal.  Mouth/Throat: Oropharynx is clear and moist. No oropharyngeal exudate.  Eyes: EOM are normal. Right eye exhibits no discharge. Left eye exhibits no discharge. Right conjunctiva is injected. Left conjunctiva is injected. No scleral icterus.  Fundoscopic exam:      The right eye shows no arteriolar narrowing, no AV nicking, no exudate, no hemorrhage and no papilledema.       The left eye shows no arteriolar narrowing, no AV nicking, no exudate, no hemorrhage and no papilledema.    Cardiovascular: Regular rhythm.  Tachycardia present.   Pulmonary/Chest: Effort normal and breath sounds normal. No respiratory distress. He has no wheezes. He has no rales.  Neurological: He is alert.  Skin: Skin is warm and dry. He is not diaphoretic.  Psychiatric: He has a normal mood and affect. His behavior is normal. Judgment and thought content normal.    Lab Review:   Urinalysis with microscopic (Order [id number])  Urinalysis with microscopic  Order: [id number]  Status:  Final result Visible to patient:  No (Not Released) Next appt:  Today at 12:00 PM in Radiation Oncology ([place] [facility]) Dx:  Spinal cord lesion ([place]); Multiple my...    Ref Range & Units 1d ago 4wk ago   Glucose Negative mg/dL Negative     Bilirubin (Urine) Negative Negative     Ketones Negative mg/dL Negative     Specific Gravity, Urine 1.003 - 1.035 1.010  1.012R    Blood Negative Trace     pH 4.6 - 8.0 6.5  5.0R    Protein  Negative- <30 mg/dL < 30  NEGATIVER    Urobilinogen, UR 0.2 - 1 mg/dL 0.2     Nitrite Negative Negative  NEGATIVER    Leukocyte Esterase Negative Negative     RBC / HPF 0 - 2 3-6  0-5R    WBC, UA 0-2;Negative 0-2  0-5R    Bacteria, UA Negative- Trace Trace  NONE SEENR    Epithelial Cells Negative- Few Occasional     Color, Urine   YELLOW    APPearance   CLEAR    Glucose, UA   NEGATIVE    Hgb urine dipstick   SMALL     Bilirubin Urine   NEGATIVE    Ketones, ur   NEGATIVE    Leukocytes, UA   NEGATIVE    Squamous Epithelial / LPF   NONE SEEN   Resulting Agency  [place] [facility]  Narrative   Performed At: [facility]        [address]        [place], [place] [address]    Specimen Collected: 09/29/16 15:46 Last Resulted: 09/29/16 16:51            R=Reference range differs from displayed range        Other Results from 09/29/2016  CBC with Differential  Order: [id number]   Status:  Final result Visible to patient:  No (Not Released) Next appt:  Today at 12:00 PM in Radiation Oncology ([place] [facility]) Dx:  Spinal cord lesion ([place]); Multiple my...    Ref Range & Units 1d ago (09/29/16) 4d ago (09/26/16) 3wk ago (09/06/16) 3wk ago (09/05/16) 3wk ago (09/05/16) 3wk ago (09/04/16) 3wk ago (09/04/16)   WBC 4.0 - 10.3 10e3/uL 14.8   7.2  11.9R   15.4R   14.5R    14.6R     NEUT# 1.5 - 6.5 10e3/uL 12.7   4.0  9.6R    12.6R    13.0R     HGB 13.0 - 17.1 g/dL 10.3   11.4   9.7R   10.3R   10.4R   8.8R   10.6R     HCT 38.4 - 49.9 % 30.4   33.4   29.3R   31.0R   30.5R   26.0R   31.1R     Platelets 140 - 400 10e3/uL 370  434   244R  259R  242R   351R    MCV 79.3 - 98.0 fL 91.6  92.3  90.4R  89.6R  89.7R   91.5R    MCH 27.2 - 33.4 pg 31.0  31.6  29.9R  29.8R  30.6R   31.2R    MCHC 32.0 - 36.0 g/dL 33.8  34.2  33.1R  33.2R  34.1R   34.1R    RBC 4.20 - 5.82 10e6/uL 3.32   3.62   3.24R   3.46R   3.40R    3.40R     RDW 11.0 - 14.6 % 15.0   14.9   14.4R  14.7R   15.1R   14.0R    lymph# 0.9 - 3.3 10e3/uL 0.7   1.9         MONO# 0.1 - 0.9 10e3/uL 1.2   0.8         Eosinophils Absolute 0.0 - 0.5 10e3/uL 0.1  0.5  0.0R   0.0R   0.0R    Basophils Absolute 0.0 - 0.1 10e3/uL 0.1  0.0  0.0R   0.0R   0.0R    NEUT% 39.0 - 75.0 % 85.7   54.9         LYMPH% 14.0 - 49.0 % 4.7   26.8         MONO% 0.0 - 14.0 % 8.4  11.5         EOS% 0.0 - 7.0 % 0.7  6.3         BASO% 0.0 - 2.0 % 0.5  0.5         Sodium       141     Basophils Relative    0   0   0    Potassium       4.1     Glucose, Bld       149      Neutrophils Relative %    81   87   89    Lymphocytes Relative    '10   7   6    '$ Lymphs Abs    1.2   1.0   0.9    Monocytes Relative    '9   6   5    '$ Monocytes Absolute    1.0   0.9   0.8    Eosinophils Relative    0   0  0   Resulting Agency  [facility]  Narrative   Performed At: [facility]        [address]        [place], [place] [address]    Specimen Collected: 09/29/16 15:46 Last Resulted: 09/29/16 16:19            R=Reference range differs from displayed range          Comprehensive metabolic panel  Order: [id number]   Status:  Final result Visible to patient:  No (Not Released) Next appt:  Today at 12:00 PM in Radiation Oncology [facility]) Dx:  Spinal cord lesion ([facility]); Multiple my...    Ref Range & Units 1d ago (09/29/16) 4d ago (09/26/16) 3wk ago (09/06/16) 3wk ago (09/05/16) 3wk ago (09/05/16) 3wk ago (09/04/16) 3wk ago (09/04/16)   Sodium 136 - 145 mEq/L 139  140  139R  140R  138R  141R  140R    Potassium 3.5 - 5.1 mEq/L 3.8  3.9  4.2R  4.0R  4.1R, CM  4.1R  3.9R    Chloride 98 - 109 mEq/L 107  106  109R  105R  106R   110R    CO2 22 - 29 mEq/L 23  25  25R  25R  24R   23R    Glucose 70 - 140 mg/dl 004  849CS  516U   610C   115R   149R   120R    Comment: Glucose reference range is for nonfasting patients. Fasting glucose reference range is 70-  100.   BUN 7.0 - 26.0 mg/dL 24.7  31.9  24P  83I  54U   16R    Creatinine 0.7 - 1.3 mg/dL 0.9  1.1  7.15Q  6.48R  1.05R   1.02R    Total Bilirubin 0.20 - 1.20 mg/dL 0.32  2.01  9.9I   4.1X   0.4R    Alkaline Phosphatase 40 - 150 U/L 62  72  25R    21R    26R     AST 5 - 34 U/L 25  18  57R    32R   26R    ALT 0 - 55 U/L 32  34  52R   18R   22R    Total Protein 6.4 - 8.3 g/dL 6.7  6.9  5.1Y    1.4A    6.4R     Albumin 3.5 - 5.0 g/dL 3.5  3.6  3.1    3.5   3.7    Calcium 8.4 - 10.4 mg/dL 9.1  32.4  6.9D   7.8A  9.3R   10.0R    Anion Gap 3 - 11 mEq/L 9  9  5R  10R  8R   7R    EGFR >90 ml/min/1.73 m2 >90  80CM         Comment: eGFR is calculated using the CKD-EPI Creatinine Equation (2009)  Resulting Agency  [facility]  Narrative   Performed At: [facility]        [address]        [place], [place] [address]    Specimen Collected: 09/29/16 15:46 Last Resulted: 09/29/16 16:45            CM=Additional commentsR=Reference range differs from displayed range            [employer] Encounter  Result Information   Status: Final result (Collected: 09/29/2016 15:46) Provider Status: Ordered    Lab Information   [place] [facility]  [place], [place]:   There are no order-level documents.  [facility] [place] (Order #[id number]) on 09/29/16  Result Read / Acknowledged   Acknowledge result  No acknowledgement history exists for this order.      -------------------------------  Imaging from last 24 hours (if applicable):  Radiology interpretation: '@IMAGING'$ @       This patient was seen with Dr. Burr Medico with my treatment plan reviewed with her. She expressed agreement with my medical management of this patient.  Addendum I have seen the patient, examined him. I agree with the assessment and  and plan and have edited the notes.   66 yo male with recently diagnosed multiple myeloma with a epidural mass around T4-T6 that required urgent surgical decompression on 09/04/2016, he has started radiation today, and was found to have right conjunctivitis and chills/rigors when he was seen in our clinic, lab showed leukocytosis with left shift. He has not started systemic therapy for his multiple myeloma yet, but he is immunocompromised due to his disease. I agree with a course of antibiotics with Levaquin, and eyedrops. He knows to call us if he spikes fever, or develops worsening symptoms. He is scheduled to have a bone marrow biopsy later this week, and see his primary oncologist Dr. Alen Blew next week.  Truitt Merle  09/29/2016

## 2016-10-01 ENCOUNTER — Other Ambulatory Visit: Payer: Self-pay | Admitting: Radiology

## 2016-10-01 ENCOUNTER — Ambulatory Visit
Admission: RE | Admit: 2016-10-01 | Discharge: 2016-10-01 | Disposition: A | Discharge status: Home / Self Care | Payer: Medicare Other | Source: Ambulatory Visit | Attending: Radiation Oncology | Admitting: Radiation Oncology

## 2016-10-01 DIAGNOSIS — Z4789 Encounter for other orthopedic aftercare: Secondary | ICD-10-CM | POA: Diagnosis not present

## 2016-10-01 DIAGNOSIS — R531 Weakness: Secondary | ICD-10-CM | POA: Diagnosis not present

## 2016-10-01 DIAGNOSIS — C9000 Multiple myeloma not having achieved remission: Secondary | ICD-10-CM | POA: Diagnosis not present

## 2016-10-01 DIAGNOSIS — R200 Anesthesia of skin: Secondary | ICD-10-CM | POA: Diagnosis not present

## 2016-10-01 DIAGNOSIS — R339 Retention of urine, unspecified: Secondary | ICD-10-CM | POA: Diagnosis not present

## 2016-10-01 DIAGNOSIS — Z510 Encounter for antineoplastic radiation therapy: Secondary | ICD-10-CM | POA: Diagnosis not present

## 2016-10-01 DIAGNOSIS — G959 Disease of spinal cord, unspecified: Secondary | ICD-10-CM | POA: Diagnosis not present

## 2016-10-01 DIAGNOSIS — M6281 Muscle weakness (generalized): Secondary | ICD-10-CM | POA: Diagnosis not present

## 2016-10-01 DIAGNOSIS — D649 Anemia, unspecified: Secondary | ICD-10-CM | POA: Diagnosis not present

## 2016-10-01 DIAGNOSIS — Z79899 Other long term (current) drug therapy: Secondary | ICD-10-CM | POA: Diagnosis not present

## 2016-10-01 DIAGNOSIS — Z8249 Family history of ischemic heart disease and other diseases of the circulatory system: Secondary | ICD-10-CM | POA: Diagnosis not present

## 2016-10-01 DIAGNOSIS — G9529 Other cord compression: Secondary | ICD-10-CM | POA: Diagnosis not present

## 2016-10-01 LAB — URINE CULTURE: Organism ID, Bacteria: NO GROWTH

## 2016-10-02 ENCOUNTER — Other Ambulatory Visit (HOSPITAL_COMMUNITY): Admission: RE | Admit: 2016-10-02 | Payer: Self-pay | Source: Ambulatory Visit | Admitting: Oncology

## 2016-10-02 ENCOUNTER — Ambulatory Visit (HOSPITAL_COMMUNITY)
Admission: RE | Admit: 2016-10-02 | Discharge: 2016-10-02 | Disposition: A | Discharge status: Home / Self Care | Payer: Medicare Other | Source: Ambulatory Visit | Attending: Oncology | Admitting: Oncology

## 2016-10-02 ENCOUNTER — Encounter (HOSPITAL_COMMUNITY): Payer: Self-pay

## 2016-10-02 ENCOUNTER — Ambulatory Visit
Admission: RE | Admit: 2016-10-02 | Discharge: 2016-10-02 | Disposition: A | Discharge status: Home / Self Care | Payer: Medicare Other | Source: Ambulatory Visit | Attending: Radiation Oncology | Admitting: Radiation Oncology

## 2016-10-02 DIAGNOSIS — D4989 Neoplasm of unspecified behavior of other specified sites: Secondary | ICD-10-CM | POA: Diagnosis not present

## 2016-10-02 DIAGNOSIS — Z79899 Other long term (current) drug therapy: Secondary | ICD-10-CM | POA: Diagnosis not present

## 2016-10-02 DIAGNOSIS — D72810 Lymphocytopenia: Secondary | ICD-10-CM | POA: Diagnosis not present

## 2016-10-02 DIAGNOSIS — R200 Anesthesia of skin: Secondary | ICD-10-CM | POA: Diagnosis not present

## 2016-10-02 DIAGNOSIS — Z8249 Family history of ischemic heart disease and other diseases of the circulatory system: Secondary | ICD-10-CM | POA: Insufficient documentation

## 2016-10-02 DIAGNOSIS — D649 Anemia, unspecified: Secondary | ICD-10-CM | POA: Insufficient documentation

## 2016-10-02 DIAGNOSIS — C9000 Multiple myeloma not having achieved remission: Secondary | ICD-10-CM | POA: Insufficient documentation

## 2016-10-02 DIAGNOSIS — R531 Weakness: Secondary | ICD-10-CM | POA: Diagnosis not present

## 2016-10-02 DIAGNOSIS — Z510 Encounter for antineoplastic radiation therapy: Secondary | ICD-10-CM | POA: Diagnosis not present

## 2016-10-02 LAB — CBC
HCT: 29.6 % — ABNORMAL LOW (ref 39.0–52.0)
Hemoglobin: 10.2 g/dL — ABNORMAL LOW (ref 13.0–17.0)
MCH: 31.2 pg (ref 26.0–34.0)
MCHC: 34.5 g/dL (ref 30.0–36.0)
MCV: 90.5 fL (ref 78.0–100.0)
Platelets: 398 10*3/uL (ref 150–400)
RBC: 3.27 MIL/uL — ABNORMAL LOW (ref 4.22–5.81)
RDW: 14.5 % (ref 11.5–15.5)
WBC: 8 10*3/uL (ref 4.0–10.5)

## 2016-10-02 LAB — PROTIME-INR
INR: 0.97
Prothrombin Time: 12.8 seconds (ref 11.4–15.2)

## 2016-10-02 LAB — APTT: aPTT: 29 seconds (ref 24–36)

## 2016-10-02 MED ORDER — LIDOCAINE HCL 1 % IJ SOLN
INTRAMUSCULAR | Status: AC | PRN
  Administered 2016-10-02: 10 mL

## 2016-10-02 MED ORDER — NALOXONE HCL 0.4 MG/ML IJ SOLN
INTRAMUSCULAR | Status: AC
  Filled 2016-10-02: qty 1

## 2016-10-02 MED ORDER — SODIUM CHLORIDE 0.9 % IV SOLN
INTRAVENOUS | Status: DC
  Administered 2016-10-02: 08:00:00 via INTRAVENOUS

## 2016-10-02 MED ORDER — FLUMAZENIL 0.5 MG/5ML IV SOLN
INTRAVENOUS | Status: AC
  Filled 2016-10-02: qty 5

## 2016-10-02 MED ORDER — MIDAZOLAM HCL 2 MG/2ML IJ SOLN
INTRAMUSCULAR | Status: AC | PRN
  Administered 2016-10-02 (×2): 1 mg via INTRAVENOUS

## 2016-10-02 MED ORDER — FENTANYL CITRATE (PF) 100 MCG/2ML IJ SOLN
INTRAMUSCULAR | Status: AC | PRN
  Administered 2016-10-02: 25 ug via INTRAVENOUS
  Administered 2016-10-02: 50 ug via INTRAVENOUS

## 2016-10-02 MED ORDER — FENTANYL CITRATE (PF) 100 MCG/2ML IJ SOLN
INTRAMUSCULAR | Status: AC
  Filled 2016-10-02: qty 6

## 2016-10-02 MED ORDER — MIDAZOLAM HCL 2 MG/2ML IJ SOLN
INTRAMUSCULAR | Status: AC
  Filled 2016-10-02: qty 6

## 2016-10-02 NOTE — Consult Note (Signed)
Chief Complaint: Patient was seen in consultation today for CT-guided bone marrow biopsy  Referring Physician(s): Wyatt Portela  Supervising Physician: Markus Daft  Patient Status: [facility] - Out-pt  History of Present Illness: Alexander Saunders is a 66 y.o. male with history of recently diagnosed multiple myeloma who presents today for CT-guided bone marrow biopsy for staging. He is also currently being treated for presumed right conjunctivitis and is due for follow-up with ophthalmologist in the next few days.  Past Medical History:  Diagnosis Date  . 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    Past Surgical History:  Procedure Laterality Date  . APPENDECTOMY    . KNEE ARTHROSCOPY     r/knee  . LAMINECTOMY N/A 09/04/2016   Procedure: THORACIC FOUR-SIX LAMINECTOMY FOR RESECTION OF TUMOR;  Surgeon: Ditty, Kevan Ny, MD;  Location: [place];  Service: Neurosurgery;  Laterality: N/A;    Allergies: Patient has no known allergies.  Medications: Prior to Admission medications   Medication Sig Start Date End Date Taking? Authorizing Provider  diazepam (VALIUM) 5 MG tablet Take 1 tablet (5 mg total) by mouth every 6 (six) hours as needed for muscle spasms. 09/06/16  Yes Costella, Vista Mink, PA-C  diphenhydrAMINE (BENADRYL) 25 MG tablet Take 25 mg by mouth every 6 (six) hours as needed.   Yes [provider]  gabapentin (NEURONTIN) 300 MG capsule Take 1 capsule (300 mg total) by mouth 3 (three) times daily. 09/06/16  Yes Costella, Vista Mink, PA-C  gentamicin (GARAMYCIN) 0.3 % ophthalmic solution Place 2 drops into both eyes 4 (four) times daily. 09/29/16  Yes Tanner, Lyndon Code., PA-C  levofloxacin (LEVAQUIN) 500 MG tablet Take 1 tablet (500 mg total) by mouth daily. 09/29/16  Yes Tanner, Lyndon Code., PA-C  loratadine (CLARITIN) 10 MG tablet Take 10 mg by mouth daily as needed for allergies.   Yes [provider]  oxyCODONE-acetaminophen (PERCOCET) 7.5-325 MG tablet Take 1 tablet by mouth  every 4 (four) hours as needed for severe pain. 09/06/16  Yes Costella, Vista Mink, PA-C  bisacodyl (DULCOLAX) 10 MG suppository Place 1 suppository (10 mg total) rectally daily as needed for moderate constipation. 09/06/16   Raiford Noble Latif, DO  menthol-cetylpyridinium (CEPACOL) 3 MG lozenge Take 1 lozenge (3 mg total) by mouth as needed for sore throat (sore throat). Patient not taking: Reported on 09/25/2016 09/06/16   Raiford Noble Latif, DO  pantoprazole (PROTONIX) 40 MG tablet Take 1 tablet (40 mg total) by mouth daily. Patient not taking: Reported on 09/25/2016 09/07/16   Raiford Noble Latif, DO  polyethylene glycol [facility] Floria Raveling) packet Take 17 g by mouth 2 (two) times daily. Patient not taking: Reported on 09/25/2016 09/06/16   Raiford Noble Latif, DO  senna-docusate (SENOKOT-S) 8.6-50 MG tablet Take 1 tablet by mouth 2 (two) times daily. Patient not taking: Reported on 09/25/2016 09/06/16   Raiford Noble Latif, DO  tamsulosin (FLOMAX) 0.4 MG CAPS capsule Take 1 capsule (0.4 mg total) by mouth daily. Patient not taking: Reported on 09/25/2016 09/07/16   Kerney Elbe, DO     Family History  Problem Relation Age of Onset  . Hypertension Mother   . Cancer Mother   . Hypertension Father     Social History   Social History  . Marital status: Married    Spouse name: N/A  . Number of children: N/A  . Years of education: N/A   Social History Main Topics  . Smoking status: Never Smoker  . Smokeless tobacco:  Never Used  . Alcohol use Yes     Comment: occ  . Drug use: Unknown  . Sexual activity: Not Asked   Other Topics Concern  . None   Social History Narrative  . None     Review of Systems currently denies fever, chest pain, dyspnea, cough, abdominal pain, nausea, vomiting or abnormal bleeding. He does have discomfort, swelling of right eye lid with some visual blurriness as well as intermittent back pain.  Vital Signs: Blood pressure 126/78, heart rate 70, temp 98.2,  respirations 16, O2 sat 100% room air   Physical Exam awake, alert. Chest with clear but slightly distant breath sounds bilaterally. Heart with regular rate and rhythm. Abdomen soft, positive bowel sounds, nontender. No lower extremity edema. Significant edema of right eye lid with associated conjunctival irritation/injection, patch in place.  Mallampati Score:     Imaging: Ct Abdomen Pelvis Wo Contrast  Result Date: 09/03/2016 CLINICAL DATA:  Metastatic disease. EXAM: CT CHEST, ABDOMEN AND PELVIS WITHOUT CONTRAST TECHNIQUE: Multidetector CT imaging of the chest, abdomen and pelvis was performed following the standard protocol without IV contrast. COMPARISON:  Ultrasound of same day.  MRI of September 02, 2016. FINDINGS: CT CHEST FINDINGS Cardiovascular: No significant vascular findings. Normal heart size. No pericardial effusion. Mediastinum/Nodes: No enlarged mediastinal, hilar, or axillary lymph nodes. Thyroid gland, trachea, and esophagus demonstrate no significant findings. Lungs/Pleura: Lungs are clear. No pleural effusion or pneumothorax. Musculoskeletal: There is noted lucency involving the body and posterior elements of T5 consistent with malignancy. Multiple other smaller lucencies are noted throughout the thoracic spine, also concerning for metastatic disease or multiple myeloma. Lucency is also noted within the manubrium suggesting lesion as well. CT ABDOMEN PELVIS FINDINGS Hepatobiliary: No focal liver abnormality is seen. No gallstones, gallbladder wall thickening, or biliary dilatation. Pancreas: Unremarkable. No pancreatic ductal dilatation or surrounding inflammatory changes. Spleen: Normal in size without focal abnormality. Adrenals/Urinary Tract: Adrenal glands appear normal. No hydronephrosis or renal obstruction is noted. No renal or ureteral calculi are noted. Foley catheter is noted within the urinary bladder. Stomach/Bowel: The stomach appears normal. There is no evidence of bowel  obstruction. Stool is noted throughout the colon. The appendix is not visualized. Vascular/Lymphatic: No significant vascular findings are present. No enlarged abdominal or pelvic lymph nodes. Reproductive: Prostate is unremarkable. Other: No abdominal wall hernia or abnormality. No abdominopelvic ascites. Musculoskeletal: Multiple lucencies are noted throughout the spine and pelvis with the largest measuring 2.8 cm involving the posterior aspect of the right acetabulum. These are concerning for multiple myeloma or metastatic disease. IMPRESSION: Multiple lucencies are noted throughout the spine and pelvis concerning for metastatic disease or multiple myeloma. The largest measures 2.8 cm in the posterior aspect of the right acetabulum. No other abnormality seen in the chest, abdomen or pelvis. Electronically Signed   By: Marijo Conception, M.D.   On: 09/03/2016 14:56   Dg Chest 1 View  Result Date: 09/02/2016 CLINICAL DATA:  Weakness EXAM: CHEST 1 VIEW COMPARISON:  None. FINDINGS: Lungs are clear.  No pleural effusion or pneumothorax. The heart is normal in size. IMPRESSION: No evidence of acute cardiopulmonary disease. Electronically Signed   By: Julian Hy M.D.   On: 09/02/2016 22:55   Ct Chest Wo Contrast  Result Date: 09/03/2016 CLINICAL DATA:  Metastatic disease. EXAM: CT CHEST, ABDOMEN AND PELVIS WITHOUT CONTRAST TECHNIQUE: Multidetector CT imaging of the chest, abdomen and pelvis was performed following the standard protocol without IV contrast. COMPARISON:  Ultrasound of same  day.  MRI of September 02, 2016. FINDINGS: CT CHEST FINDINGS Cardiovascular: No significant vascular findings. Normal heart size. No pericardial effusion. Mediastinum/Nodes: No enlarged mediastinal, hilar, or axillary lymph nodes. Thyroid gland, trachea, and esophagus demonstrate no significant findings. Lungs/Pleura: Lungs are clear. No pleural effusion or pneumothorax. Musculoskeletal: There is noted lucency involving the body  and posterior elements of T5 consistent with malignancy. Multiple other smaller lucencies are noted throughout the thoracic spine, also concerning for metastatic disease or multiple myeloma. Lucency is also noted within the manubrium suggesting lesion as well. CT ABDOMEN PELVIS FINDINGS Hepatobiliary: No focal liver abnormality is seen. No gallstones, gallbladder wall thickening, or biliary dilatation. Pancreas: Unremarkable. No pancreatic ductal dilatation or surrounding inflammatory changes. Spleen: Normal in size without focal abnormality. Adrenals/Urinary Tract: Adrenal glands appear normal. No hydronephrosis or renal obstruction is noted. No renal or ureteral calculi are noted. Foley catheter is noted within the urinary bladder. Stomach/Bowel: The stomach appears normal. There is no evidence of bowel obstruction. Stool is noted throughout the colon. The appendix is not visualized. Vascular/Lymphatic: No significant vascular findings are present. No enlarged abdominal or pelvic lymph nodes. Reproductive: Prostate is unremarkable. Other: No abdominal wall hernia or abnormality. No abdominopelvic ascites. Musculoskeletal: Multiple lucencies are noted throughout the spine and pelvis with the largest measuring 2.8 cm involving the posterior aspect of the right acetabulum. These are concerning for multiple myeloma or metastatic disease. IMPRESSION: Multiple lucencies are noted throughout the spine and pelvis concerning for metastatic disease or multiple myeloma. The largest measures 2.8 cm in the posterior aspect of the right acetabulum. No other abnormality seen in the chest, abdomen or pelvis. Electronically Signed   By: Marijo Conception, M.D.   On: 09/03/2016 14:56   Mr Thoracic Spine W Wo Contrast  Result Date: 09/02/2016 CLINICAL DATA:  Right leg weakness.  Difficulty urinating. EXAM: MRI THORACIC AND LUMBAR SPINE WITHOUT AND WITH CONTRAST TECHNIQUE: Multiplanar and multiecho pulse sequences of the thoracic  and lumbar spine were obtained without and with intravenous contrast. CONTRAST:  75m MULTIHANCE GADOBENATE DIMEGLUMINE 529 MG/ML IV SOLN COMPARISON:  None. FINDINGS: MRI THORACIC SPINE FINDINGS Alignment:  Physiologic. Vertebrae: There is diffuse signal abnormality throughout the thoracic vertebral column bone marrow with innumerable low T1 weighted signal lesions that show enhancement following contrast administration. There is no compression fracture. There is an infiltrative T5 lesion occupying the left aspect of the vertebral body and extending into the posterior elements with a large dorsal epidural component that extends from T4-T6 and measures 1.0 x 1.4 x 6.6 cm (AP x Transverse x CC). This mass anteriorly displaces and compresses the spinal cord. Cord: There is no visible cord signal change. No syrinx or abnormal spinal cord enhancement. Paraspinal and other soft tissues: There is abnormal enhancement of the interspinous soft tissues at the T4-T6 levels. Disc levels: As above, the dorsal epidural mass at the T4-T6 levels effaces the thecal sac and compresses the spinal cord. Otherwise, there is no thoracic spinal canal stenosis. There is mild extension of the mass into the bilateral T5-T6 neural foramina, but the foramina remain patent with mild-to-moderate stenosis. MRI LUMBAR SPINE FINDINGS Segmentation:  Standard Alignment:  Normal Vertebrae: Diffusely abnormal bone marrow signal. No compression fracture. Conus medullaris: Extends to the L1 level and appears normal. No lumbar epidural disease. No compression of the cauda equina nerve roots. Paraspinal and other soft tissues: The visualized aorta, IVC and iliac vessels are normal. The visualized retroperitoneal organs and paraspinal soft tissues are  normal. Disc levels: T12-L1: Normal disc space and facets. No spinal canal or neuroforaminal stenosis. L1-L2: Small central disc protrusion without spinal canal or neural foraminal stenosis. L2-L3: Minimal  disc bulge without stenosis. L3-L4: Mild disc bulge without stenosis. L4-L5: Mild bilateral facet hypertrophy with disc desiccation and small bulge. No spinal canal stenosis. Mild bilateral neural foraminal stenosis. L5-S1: Normal disc space and facets. No spinal canal or neuroforaminal stenosis. Visualized sacrum: Diffuse sacral bone marrow abnormality, as throughout the thoracic and lumbar spine. IMPRESSION: 1. Dorsal epidural mass at the T4-T6 level resulting in compression of the thoracic spinal cord. 2. Diffusely abnormal bone marrow signal and contrast enhancement, consistent with a marrow replacement process such as multiple myeloma versus diffuse osseous metastatic disease. 3. No pathologic compression fracture. Critical Value/emergent results were called by telephone at the time of interpretation on 09/02/2016 at 8:34 pm to Dr. Fredia Sorrow, who verbally acknowledged these results. Electronically Signed   By: Ulyses Jarred M.D.   On: 09/02/2016 20:46   Mr Lumbar Spine W Wo Contrast  Result Date: 09/02/2016 CLINICAL DATA:  Right leg weakness.  Difficulty urinating. EXAM: MRI THORACIC AND LUMBAR SPINE WITHOUT AND WITH CONTRAST TECHNIQUE: Multiplanar and multiecho pulse sequences of the thoracic and lumbar spine were obtained without and with intravenous contrast. CONTRAST:  3m MULTIHANCE GADOBENATE DIMEGLUMINE 529 MG/ML IV SOLN COMPARISON:  None. FINDINGS: MRI THORACIC SPINE FINDINGS Alignment:  Physiologic. Vertebrae: There is diffuse signal abnormality throughout the thoracic vertebral column bone marrow with innumerable low T1 weighted signal lesions that show enhancement following contrast administration. There is no compression fracture. There is an infiltrative T5 lesion occupying the left aspect of the vertebral body and extending into the posterior elements with a large dorsal epidural component that extends from T4-T6 and measures 1.0 x 1.4 x 6.6 cm (AP x Transverse x CC). This mass  anteriorly displaces and compresses the spinal cord. Cord: There is no visible cord signal change. No syrinx or abnormal spinal cord enhancement. Paraspinal and other soft tissues: There is abnormal enhancement of the interspinous soft tissues at the T4-T6 levels. Disc levels: As above, the dorsal epidural mass at the T4-T6 levels effaces the thecal sac and compresses the spinal cord. Otherwise, there is no thoracic spinal canal stenosis. There is mild extension of the mass into the bilateral T5-T6 neural foramina, but the foramina remain patent with mild-to-moderate stenosis. MRI LUMBAR SPINE FINDINGS Segmentation:  Standard Alignment:  Normal Vertebrae: Diffusely abnormal bone marrow signal. No compression fracture. Conus medullaris: Extends to the L1 level and appears normal. No lumbar epidural disease. No compression of the cauda equina nerve roots. Paraspinal and other soft tissues: The visualized aorta, IVC and iliac vessels are normal. The visualized retroperitoneal organs and paraspinal soft tissues are normal. Disc levels: T12-L1: Normal disc space and facets. No spinal canal or neuroforaminal stenosis. L1-L2: Small central disc protrusion without spinal canal or neural foraminal stenosis. L2-L3: Minimal disc bulge without stenosis. L3-L4: Mild disc bulge without stenosis. L4-L5: Mild bilateral facet hypertrophy with disc desiccation and small bulge. No spinal canal stenosis. Mild bilateral neural foraminal stenosis. L5-S1: Normal disc space and facets. No spinal canal or neuroforaminal stenosis. Visualized sacrum: Diffuse sacral bone marrow abnormality, as throughout the thoracic and lumbar spine. IMPRESSION: 1. Dorsal epidural mass at the T4-T6 level resulting in compression of the thoracic spinal cord. 2. Diffusely abnormal bone marrow signal and contrast enhancement, consistent with a marrow replacement process such as multiple myeloma versus diffuse osseous metastatic disease.  3. No pathologic  compression fracture. Critical Value/emergent results were called by telephone at the time of interpretation on 09/02/2016 at 8:34 pm to Dr. Fredia Sorrow, who verbally acknowledged these results. Electronically Signed   By: Ulyses Jarred M.D.   On: 09/02/2016 20:46   US Renal  Result Date: 09/03/2016 CLINICAL DATA:  Acute kidney injury. EXAM: RENAL / URINARY TRACT ULTRASOUND COMPLETE COMPARISON:  None. FINDINGS: Right Kidney: Length: 12.3 cm. Echogenicity within normal limits. No mass or hydronephrosis visualized. Left Kidney: Length: 11.3 cm. Echogenicity within normal limits. No mass or hydronephrosis visualized. Bladder: Decompressed with a Foley catheter. IMPRESSION: Negative exam. Electronically Signed   By: Staci Righter M.D.   On: 09/03/2016 07:52   [place] Wo Or W Contrast  Result Date: 09/04/2016 CLINICAL DATA:  Request for preop thoracic spinal localization by CT. Request was made to place a marker at the T5 pedicular level. EXAM: CT THORACIC SPINE WITHOUT CONTRAST TECHNIQUE: Multidetector CT images of the thoracic were obtained using the standard protocol without intravenous contrast. COMPARISON:  Chest CT from yesterday.  Thoracic spine MRI 09/02/2016 FINDINGS: Plan was discussed with Dr. Cyndy Freeze via telephone to ensure the correct level was selected. The patient was placed prone on the table with arms up. A mini CT of the thoracic spine was performed based on a AP and lateral scanogram. CT was performed with radiopaque grid in place. The T5 pedicular level was localized and BBs placed using table position and laser level. The grid was removed and another mini CT was performed to confirm positioning. Two BBs were placed, 1 over each of the bilateral T5 pedicles. In case the sticker moved during transport, they were outlined with a skin marker. IMPRESSION: Patient was marked prone with arms up. Two cutaneous BBs were placed, 1 over each of the eroded T5 pedicles. In case the stickers move  during transport, they were outlined with a skin marker. Electronically Signed   By: Monte Fantasia M.D.   On: 09/04/2016 13:48    Labs:  CBC:  Recent Labs  09/06/16 0518 09/26/16 0801 09/29/16 1546 10/02/16 0726  WBC 11.9* 7.2 14.8* 8.0  HGB 9.7* 11.4* 10.3* 10.2*  HCT 29.3* 33.4* 30.4* 29.6*  PLT 244 434* 370 398    COAGS:  Recent Labs  09/04/16 0952 10/02/16 0726  INR 0.99 0.97  APTT 27 29    BMP:  Recent Labs  09/04/16 0419  09/05/16 0357 09/05/16 1224 09/06/16 0518 09/26/16 0801 09/29/16 1546  NA 140  < > 138 140 139 140 139  K 3.9  < > 4.1 4.0 4.2 3.9 3.8  CL 110  --  106 105 109  --   --   CO2 23  --  '24 25 25 25 23  '$ GLUCOSE 120*  < > 115* 112* 126* 111 130  BUN 16  --  '15 13 15 '$ 12.3 10.7  CALCIUM 10.0  --  9.3 9.6 8.8* 10.2 9.1  CREATININE 1.02  --  1.05 1.03 0.98 1.1 0.9  GFRNONAA >60  --  >60 >60 >60  --   --   GFRAA >60  --  >60 >60 >60  --   --   < > = values in this interval not displayed.  LIVER FUNCTION TESTS:  Recent Labs  09/05/16 0357 09/06/16 0518 09/26/16 0801 09/26/16 0801 09/29/16 1546  BILITOT 0.7 0.5 0.47  --  0.51  AST 32 57* 18  --  25  ALT  18 52 34  --  32  ALKPHOS 21* 25* 72  --  62  PROT 5.6* 5.3* 6.9 6.3 6.7  ALBUMIN 3.5 3.1* 3.6  --  3.5    TUMOR MARKERS: No results for input(s): AFPTM, CEA, CA199, CHROMGRNA in the last 8760 hours.  Assessment and Plan: 66 y.o. male with history of recently diagnosed multiple myeloma who presents today for CT-guided bone marrow biopsy for staging.Risks and benefits discussed with the patient including, but not limited to bleeding, infection, damage to adjacent structures or low yield requiring additional tests.All of the patient's questions were answered, patient is agreeable to proceed.Consent signed and in chart. He is currently on treatment for presumed right conjunctivitis and is due to follow up with ophthalmology in the next few days.     Thank you for this interesting  consult.  I greatly enjoyed meeting Alexander Saunders and look forward to participating in their care.  A copy of this report was sent to the requesting provider on this date.  Electronically Signed: D. Rowe Krystian, PA-C 10/02/2016, 8:16 AM   I spent a total of  20 minutes   in face to face in clinical consultation, greater than 50% of which was counseling/coordinating care for CT-guided bone marrow biopsy

## 2016-10-02 NOTE — Procedures (Signed)
CT guided bone marrow biopsy from right ilium.  2 aspirates and 1 core.  Minimal blood loss and no immediate complication.

## 2016-10-02 NOTE — Discharge Instructions (Signed)

## 2016-10-02 NOTE — Progress Notes (Signed)
Patient transported from Short Stay 3rd floor by staff via wheelchair to Radiation Oncology with no issues noted.  Karna Christmas (wife) accompanied patient.

## 2016-10-03 ENCOUNTER — Ambulatory Visit (HOSPITAL_COMMUNITY)
Admission: RE | Admit: 2016-10-03 | Discharge: 2016-10-03 | Disposition: A | Discharge status: Home / Self Care | Payer: Medicare Other | Source: Ambulatory Visit | Attending: Ophthalmology | Admitting: Ophthalmology

## 2016-10-03 ENCOUNTER — Other Ambulatory Visit (HOSPITAL_COMMUNITY): Payer: Self-pay | Admitting: Ophthalmology

## 2016-10-03 ENCOUNTER — Ambulatory Visit
Admission: RE | Admit: 2016-10-03 | Discharge: 2016-10-03 | Disposition: A | Discharge status: Home / Self Care | Payer: Medicare Other | Source: Ambulatory Visit | Attending: Radiation Oncology | Admitting: Radiation Oncology

## 2016-10-03 DIAGNOSIS — H0520 Unspecified exophthalmos: Secondary | ICD-10-CM | POA: Diagnosis not present

## 2016-10-03 DIAGNOSIS — R531 Weakness: Secondary | ICD-10-CM | POA: Diagnosis not present

## 2016-10-03 DIAGNOSIS — Z510 Encounter for antineoplastic radiation therapy: Secondary | ICD-10-CM | POA: Diagnosis not present

## 2016-10-03 DIAGNOSIS — Z79899 Other long term (current) drug therapy: Secondary | ICD-10-CM | POA: Diagnosis not present

## 2016-10-03 DIAGNOSIS — I82890 Acute embolism and thrombosis of other specified veins: Secondary | ICD-10-CM

## 2016-10-03 DIAGNOSIS — H578 Other specified disorders of eye and adnexa: Secondary | ICD-10-CM | POA: Diagnosis not present

## 2016-10-03 DIAGNOSIS — H02401 Unspecified ptosis of right eyelid: Secondary | ICD-10-CM | POA: Diagnosis not present

## 2016-10-03 DIAGNOSIS — H2513 Age-related nuclear cataract, bilateral: Secondary | ICD-10-CM | POA: Diagnosis not present

## 2016-10-03 DIAGNOSIS — H0589 Other disorders of orbit: Secondary | ICD-10-CM

## 2016-10-03 DIAGNOSIS — C9000 Multiple myeloma not having achieved remission: Secondary | ICD-10-CM | POA: Insufficient documentation

## 2016-10-03 DIAGNOSIS — H15101 Unspecified episcleritis, right eye: Secondary | ICD-10-CM | POA: Diagnosis not present

## 2016-10-03 DIAGNOSIS — H02409 Unspecified ptosis of unspecified eyelid: Secondary | ICD-10-CM | POA: Diagnosis not present

## 2016-10-03 DIAGNOSIS — R200 Anesthesia of skin: Secondary | ICD-10-CM | POA: Diagnosis not present

## 2016-10-03 DIAGNOSIS — C7951 Secondary malignant neoplasm of bone: Secondary | ICD-10-CM | POA: Diagnosis not present

## 2016-10-03 DIAGNOSIS — H05019 Cellulitis of unspecified orbit: Secondary | ICD-10-CM | POA: Diagnosis not present

## 2016-10-03 DIAGNOSIS — Z8249 Family history of ischemic heart disease and other diseases of the circulatory system: Secondary | ICD-10-CM | POA: Diagnosis not present

## 2016-10-03 MED ORDER — IOPAMIDOL (ISOVUE-300) INJECTION 61%
75.0000 mL | Freq: Once | INTRAVENOUS | Status: AC | PRN
  Administered 2016-10-03: 75 mL via INTRAVENOUS

## 2016-10-03 MED ORDER — IOPAMIDOL (ISOVUE-300) INJECTION 61%
INTRAVENOUS | Status: AC
  Filled 2016-10-03: qty 75

## 2016-10-03 MED ORDER — GADOBENATE DIMEGLUMINE 529 MG/ML IV SOLN
20.0000 mL | Freq: Once | INTRAVENOUS | Status: AC | PRN
  Administered 2016-10-03: 20 mL via INTRAVENOUS

## 2016-10-04 DIAGNOSIS — H2513 Age-related nuclear cataract, bilateral: Secondary | ICD-10-CM | POA: Diagnosis not present

## 2016-10-04 DIAGNOSIS — H15101 Unspecified episcleritis, right eye: Secondary | ICD-10-CM | POA: Diagnosis not present

## 2016-10-05 LAB — CULTURE, BLOOD (SINGLE)

## 2016-10-07 ENCOUNTER — Encounter: Payer: Self-pay | Admitting: *Deleted

## 2016-10-07 ENCOUNTER — Ambulatory Visit
Admission: RE | Admit: 2016-10-07 | Discharge: 2016-10-07 | Disposition: A | Discharge status: Home / Self Care | Payer: Medicare Other | Source: Ambulatory Visit | Attending: Radiation Oncology | Admitting: Radiation Oncology

## 2016-10-07 DIAGNOSIS — C9000 Multiple myeloma not having achieved remission: Secondary | ICD-10-CM | POA: Diagnosis not present

## 2016-10-07 DIAGNOSIS — Z8249 Family history of ischemic heart disease and other diseases of the circulatory system: Secondary | ICD-10-CM | POA: Diagnosis not present

## 2016-10-07 DIAGNOSIS — Z79899 Other long term (current) drug therapy: Secondary | ICD-10-CM | POA: Diagnosis not present

## 2016-10-07 DIAGNOSIS — H2513 Age-related nuclear cataract, bilateral: Secondary | ICD-10-CM | POA: Diagnosis not present

## 2016-10-07 DIAGNOSIS — Z510 Encounter for antineoplastic radiation therapy: Secondary | ICD-10-CM | POA: Diagnosis not present

## 2016-10-07 DIAGNOSIS — H15101 Unspecified episcleritis, right eye: Secondary | ICD-10-CM | POA: Diagnosis not present

## 2016-10-07 DIAGNOSIS — R531 Weakness: Secondary | ICD-10-CM | POA: Diagnosis not present

## 2016-10-07 DIAGNOSIS — R200 Anesthesia of skin: Secondary | ICD-10-CM | POA: Diagnosis not present

## 2016-10-08 ENCOUNTER — Emergency Department (HOSPITAL_COMMUNITY): Payer: Medicare Other

## 2016-10-08 ENCOUNTER — Ambulatory Visit: Admission: RE | Admit: 2016-10-08 | Payer: Medicare Other | Source: Ambulatory Visit

## 2016-10-08 ENCOUNTER — Encounter (HOSPITAL_COMMUNITY): Payer: Self-pay | Admitting: Emergency Medicine

## 2016-10-08 ENCOUNTER — Emergency Department (HOSPITAL_COMMUNITY)
Admission: EM | Admit: 2016-10-08 | Discharge: 2016-10-08 | Disposition: A | Discharge status: Home / Self Care | Payer: Medicare Other | Attending: Emergency Medicine | Admitting: Emergency Medicine

## 2016-10-08 DIAGNOSIS — M62838 Other muscle spasm: Secondary | ICD-10-CM | POA: Diagnosis not present

## 2016-10-08 DIAGNOSIS — R404 Transient alteration of awareness: Secondary | ICD-10-CM | POA: Diagnosis not present

## 2016-10-08 DIAGNOSIS — G893 Neoplasm related pain (acute) (chronic): Secondary | ICD-10-CM | POA: Diagnosis not present

## 2016-10-08 DIAGNOSIS — D75839 Thrombocytosis, unspecified: Secondary | ICD-10-CM

## 2016-10-08 DIAGNOSIS — D473 Essential (hemorrhagic) thrombocythemia: Secondary | ICD-10-CM | POA: Insufficient documentation

## 2016-10-08 DIAGNOSIS — M25551 Pain in right hip: Secondary | ICD-10-CM | POA: Insufficient documentation

## 2016-10-08 DIAGNOSIS — R531 Weakness: Secondary | ICD-10-CM | POA: Diagnosis not present

## 2016-10-08 DIAGNOSIS — C7951 Secondary malignant neoplasm of bone: Secondary | ICD-10-CM | POA: Diagnosis not present

## 2016-10-08 DIAGNOSIS — I951 Orthostatic hypotension: Secondary | ICD-10-CM | POA: Insufficient documentation

## 2016-10-08 DIAGNOSIS — D649 Anemia, unspecified: Secondary | ICD-10-CM | POA: Insufficient documentation

## 2016-10-08 DIAGNOSIS — R55 Syncope and collapse: Secondary | ICD-10-CM | POA: Diagnosis not present

## 2016-10-08 DIAGNOSIS — C9000 Multiple myeloma not having achieved remission: Secondary | ICD-10-CM | POA: Diagnosis not present

## 2016-10-08 DIAGNOSIS — K5903 Drug induced constipation: Secondary | ICD-10-CM | POA: Insufficient documentation

## 2016-10-08 LAB — CBC
HCT: 31.3 % — ABNORMAL LOW (ref 39.0–52.0)
Hemoglobin: 10.5 g/dL — ABNORMAL LOW (ref 13.0–17.0)
MCH: 30.1 pg (ref 26.0–34.0)
MCHC: 33.5 g/dL (ref 30.0–36.0)
MCV: 89.7 fL (ref 78.0–100.0)
Platelets: 427 10*3/uL — ABNORMAL HIGH (ref 150–400)
RBC: 3.49 MIL/uL — ABNORMAL LOW (ref 4.22–5.81)
RDW: 14.3 % (ref 11.5–15.5)
WBC: 14.1 10*3/uL — ABNORMAL HIGH (ref 4.0–10.5)

## 2016-10-08 LAB — BASIC METABOLIC PANEL
Anion gap: 7 (ref 5–15)
BUN: 21 mg/dL — ABNORMAL HIGH (ref 6–20)
CO2: 26 mmol/L (ref 22–32)
Calcium: 9.4 mg/dL (ref 8.9–10.3)
Chloride: 104 mmol/L (ref 101–111)
Creatinine, Ser: 0.93 mg/dL (ref 0.61–1.24)
GFR calc Af Amer: >60 mL/min (ref >60)
GFR calc non Af Amer: >60 mL/min (ref >60)
Glucose, Bld: 134 mg/dL — ABNORMAL HIGH (ref 65–99)
Potassium: 4.1 mmol/L (ref 3.5–5.1)
Sodium: 137 mmol/L (ref 135–145)

## 2016-10-08 LAB — URINALYSIS, ROUTINE W REFLEX MICROSCOPIC
Bilirubin Urine: NEGATIVE
Glucose, UA: NEGATIVE mg/dL
Hgb urine dipstick: NEGATIVE
Ketones, ur: 5 mg/dL — AB
Leukocytes, UA: NEGATIVE
Nitrite: NEGATIVE
Protein, ur: NEGATIVE mg/dL
Specific Gravity, Urine: 1.021 (ref 1.005–1.030)
pH: 6 (ref 5.0–8.0)

## 2016-10-08 LAB — I-STAT TROPONIN, ED: Troponin i, poc: 0 ng/mL (ref 0.00–0.08)

## 2016-10-08 LAB — CBG MONITORING, ED: Glucose-Capillary: 125 mg/dL — ABNORMAL HIGH (ref 65–99)

## 2016-10-08 MED ORDER — SODIUM CHLORIDE 0.9 % IV BOLUS (SEPSIS)
1000.0000 mL | Freq: Once | INTRAVENOUS | Status: AC
  Administered 2016-10-08: 1000 mL via INTRAVENOUS

## 2016-10-08 MED ORDER — KETOROLAC TROMETHAMINE 30 MG/ML IJ SOLN
15.0000 mg | Freq: Once | INTRAMUSCULAR | Status: AC
  Administered 2016-10-08: 15 mg via INTRAVENOUS
  Filled 2016-10-08: qty 1

## 2016-10-08 MED ORDER — OXYCODONE-ACETAMINOPHEN 5-325 MG PO TABS: 1.0000 | ORAL_TABLET | Freq: Four times a day (QID) | ORAL | 0 refills | Status: DC | PRN

## 2016-10-08 MED ORDER — METHYLPREDNISOLONE SODIUM SUCC 125 MG IJ SOLR
125.0000 mg | Freq: Once | INTRAMUSCULAR | Status: AC
  Administered 2016-10-08: 125 mg via INTRAVENOUS
  Filled 2016-10-08: qty 2

## 2016-10-08 MED ORDER — DIAZEPAM 5 MG PO TABS: 5.0000 mg | ORAL_TABLET | Freq: Three times a day (TID) | ORAL | 0 refills | Status: DC | PRN

## 2016-10-08 MED ORDER — PREDNISONE 20 MG PO TABS: ORAL_TABLET | ORAL | 0 refills | Status: DC

## 2016-10-08 MED ORDER — FENTANYL CITRATE (PF) 100 MCG/2ML IJ SOLN
50.0000 ug | Freq: Once | INTRAMUSCULAR | Status: AC
  Administered 2016-10-08: 50 ug via INTRAVENOUS
  Filled 2016-10-08: qty 2

## 2016-10-08 NOTE — ED Notes (Signed)
Patient transported to X-ray 

## 2016-10-08 NOTE — ED Triage Notes (Addendum)
Per EMS pt c/o syncopal episode today, no fall, pt was eased to the floor. On EMS arrival, pt was diaphoretic and hypotensive at 98/60. EMS administered 300 mL NS en route. Alert and oriented x 4. Pt has been receiving radiation therapy x 7 days. Pt had spinal tumor removed Augmentin. Pt restarted oxycodone-acetaminophen 7.5-325 and diazepam 5mg  tablet today for muscle spasm pain, which family thinks may be cause.

## 2016-10-08 NOTE — ED Provider Notes (Signed)
[facility] DEPT Provider Note   CSN: [id number] Arrival date & time: 10/08/16  1234     History   Chief Complaint No chief complaint on file.   HPI Alexander Saunders is a 66 y.o. male with a PMHx of multiple myeloma with bony mets currently undergoing radiation with plan for chemotherapy after, and plasma cell neoplasm, with PSHx of T4-6 laminectomy for tumor resection 09/04/16 by Dr. Cyndy Freeze, who presents to the ED via EMS with complaints of syncopal episode. Patient states that this morning a few hrs PTA, he was sliding down the stairs on his buttocks when he started feeling lightheaded and like he was in a tunnel, and subsequently had a syncopal event. His wife was in front of him helping him down the steps, so she was able to assist him and thus pt did not hit his head or fall from the step he was seated on. Patient's wife says that he complained of feeling lightheaded, stated that he felt like he was going to pass out, and then his eyes rolled back and he was "blacked out" for about 1 minute. His wife reports that he was diaphoretic as well. EMS arrived and found him to be hypotensive 98/60, gave him a 300 mL bolus and he immediately improved, his BP has been in the 110s/60s since then and his lightheadedness completely resolved. He states that he believes that he was dehydrated and that the medications he took this morning could have caused his syncopal event. He states he has not eaten anything since last night, and this morning due to his right hip hurting he took Percocet 7.5-325 mg and Valium 5 mg shortly prior to the syncopal event. He thinks this caused his lightheadedness. He and his wife deny fall, head inj, tongue biting, incontinence, post-ictal state, or confusion. Pt's wife states that immediately after the fluids, he "perked up" and has been normal since then. No other tx given, and no other known aggravating factors aside from potentially not eating and taking narcotics/pain meds.    He reports that he had not needed the pain medications in a while, has been doing well since his surgery last month, and therefore had not been taking any of the narcotics/benzos. Yesterday, however, he started having intense R hip pain which he had not had until then. He attributes his pain to the fact that yesterday he "over did it" by walking a lot more than he had been since his surgery. He describes the pain as 10/10 intermittent sharp nonradiating right posterior hip pain that worsens with movement or attempting to ambulate, and has been improved with Percocet and Valium. He states it feels like a muscle spasm. Currently he states he feels improved if he is laying down, however if he attempts to move or ambulate the pain becomes very severe. He denies any recent injuries or trauma to the hip. He states that the right leg has been weak since before his surgery, which is not new. He has not had any back pain since surgery, has been doing well overall. He has completed 7 out of 10 radiation treatments. His oncologist is Dr. Alen Blew and Dr. Jacelyn Grip. Of note, he has chronic constipation due to the narcotics, which is unchanged. Last BM 3 days ago which is fairly typical for him. Also states he hasn't been sleeping well.  He denies fevers, chills, CP, SOB, palpitations, HA, vision changes, abd pain, N/V/D, obstipation, hematuria, dysuria, incontinence of urine/stool, saddle anesthesia/cauda equina symptoms, back pain,  numbness, tingling, new/worsening focal weakness, or any other complaints at this time.    The history is provided by the patient and medical records. No language interpreter was used.  Loss of Consciousness   This is a new problem. The current episode started 3 to 5 hours ago. The problem occurs rarely. The problem has been resolved. He lost consciousness for a period of less than one minute. Associated with: medications on empty stomach. Associated symptoms include diaphoresis and  light-headedness. Pertinent negatives include abdominal pain, back pain, bladder incontinence, bowel incontinence, chest pain, confusion, fever, focal sensory loss, headaches, nausea, vomiting and weakness (RLE weakness, chronic since before surgery). Treatments tried: fluids. The treatment provided significant relief.    Past Medical History:  Diagnosis Date  . 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    Patient Active Problem List   Diagnosis Date Noted  . Metastasis to bone ([place]) 09/25/2016  . Multiple myeloma ([place]) 09/18/2016  . Lower extremity weakness 09/03/2016  . Acute kidney injury ([place]) 09/03/2016  . Urinary retention 09/03/2016  . Leukocytosis 09/03/2016  . Hypercalcemia 09/03/2016  . Spinal cord lesion ([place]) 09/03/2016  . Epidural mass 09/02/2016    Past Surgical History:  Procedure Laterality Date  . APPENDECTOMY    . KNEE ARTHROSCOPY     r/knee  . LAMINECTOMY N/A 09/04/2016   Procedure: THORACIC FOUR-SIX LAMINECTOMY FOR RESECTION OF TUMOR;  Surgeon: Ditty, Kevan Ny, MD;  Location: [facility];  Service: Neurosurgery;  Laterality: N/A;       Home Medications    Prior to Admission medications   Medication Sig Start Date End Date Taking? Authorizing Provider  bisacodyl (DULCOLAX) 10 MG suppository Place 1 suppository (10 mg total) rectally daily as needed for moderate constipation. 09/06/16  Yes Sheikh, Omair Latif, DO  diazepam (VALIUM) 5 MG tablet Take 1 tablet (5 mg total) by mouth every 6 (six) hours as needed for muscle spasms. 09/06/16  Yes Costella, Vista Mink, PA-C  diphenhydrAMINE (BENADRYL) 25 MG tablet Take 25 mg by mouth every 6 (six) hours as needed for itching or allergies.    Yes [provider]  levofloxacin (LEVAQUIN) 500 MG tablet Take 1 tablet (500 mg total) by mouth daily. 09/29/16  Yes Tanner, Lyndon Code., PA-C  loratadine (CLARITIN) 10 MG tablet Take 10 mg by mouth daily as needed for allergies.   Yes [provider]  oxyCODONE-acetaminophen  (PERCOCET/ROXICET) 5-325 MG tablet Take 1 tablet by mouth every 6 (six) hours as needed for pain. 09/27/16  Yes [provider]  UNKNOWN TO PATIENT Place 2 drops into the right eye daily. Non-steroidal for inflammation.   Yes [provider]  gabapentin (NEURONTIN) 300 MG capsule Take 1 capsule (300 mg total) by mouth 3 (three) times daily. 09/06/16   Costella, Vista Mink, PA-C  gentamicin (GARAMYCIN) 0.3 % ophthalmic solution Place 2 drops into both eyes 4 (four) times daily. Patient not taking: Reported on 10/08/2016 09/29/16   Harle Stanford., PA-C  menthol-cetylpyridinium (CEPACOL) 3 MG lozenge Take 1 lozenge (3 mg total) by mouth as needed for sore throat (sore throat). Patient not taking: Reported on 09/25/2016 09/06/16   Raiford Noble Latif, DO  oxyCODONE-acetaminophen (PERCOCET) 7.5-325 MG tablet Take 1 tablet by mouth every 4 (four) hours as needed for severe pain. Patient not taking: Reported on 10/08/2016 09/06/16   Traci Sermon, PA-C  pantoprazole (PROTONIX) 40 MG tablet Take 1 tablet (40 mg total) by mouth daily. Patient not taking: Reported on 09/25/2016 09/07/16  Sheikh, Omair Latif, DO  polyethylene glycol (MIRALAX / GLYCOLAX) packet Take 17 g by mouth 2 (two) times daily. Patient not taking: Reported on 09/25/2016 09/06/16   Raiford Noble Latif, DO  senna-docusate (SENOKOT-S) 8.6-50 MG tablet Take 1 tablet by mouth 2 (two) times daily. Patient not taking: Reported on 09/25/2016 09/06/16   Raiford Noble Latif, DO  tamsulosin (FLOMAX) 0.4 MG CAPS capsule Take 1 capsule (0.4 mg total) by mouth daily. Patient not taking: Reported on 09/25/2016 09/07/16   Kerney Elbe, DO    Family History Family History  Problem Relation Age of Onset  . Hypertension Mother   . Cancer Mother   . Hypertension Father     Social History Social History  Substance Use Topics  . Smoking status: Never Smoker  . Smokeless tobacco: Never Used  . Alcohol use Yes     Comment: occ      Allergies   Patient has no known allergies.   Review of Systems Review of Systems  Constitutional: Positive for diaphoresis. Negative for chills and fever.  HENT: Negative for facial swelling (no head inj).        No tongue biting  Eyes: Negative for visual disturbance.  Respiratory: Negative for shortness of breath.   Cardiovascular: Positive for syncope. Negative for chest pain.  Gastrointestinal: Positive for constipation (chronic and unchanged). Negative for abdominal pain, bowel incontinence, diarrhea, nausea and vomiting.  Genitourinary: Negative for bladder incontinence, difficulty urinating (no incontinence), dysuria and hematuria.  Musculoskeletal: Positive for arthralgias and myalgias. Negative for back pain.  Skin: Negative for color change.  Allergic/Immunologic: Positive for immunocompromised state (multiple myeloma undergoing radiation).  Neurological: Positive for syncope and light-headedness. Negative for weakness (RLE weakness, chronic since before surgery), numbness and headaches.  Psychiatric/Behavioral: Positive for sleep disturbance. Negative for confusion.   All other systems reviewed and are negative for acute change except as noted in the HPI.    Physical Exam Updated Vital Signs BP 112/73   Pulse 79   Temp 98.1 F (36.7 C) (Oral)   Resp 14   SpO2 99%   Physical Exam  Constitutional: He is oriented to person, place, and time. Vital signs are normal. He appears well-developed and well-nourished.  Non-toxic appearance. No distress.  Afebrile, nontoxic, NAD  HENT:  Head: Normocephalic and atraumatic.  Mouth/Throat: Oropharynx is clear and moist and mucous membranes are normal.  Eyes: Conjunctivae and EOM are normal. Right eye exhibits no discharge. Left eye exhibits no discharge.  Neck: Normal range of motion. Neck supple.  Cardiovascular: Normal rate, regular rhythm, normal heart sounds and intact distal pulses.  Exam reveals no gallop and no  friction rub.   No murmur heard. Pulmonary/Chest: Effort normal and breath sounds normal. No respiratory distress. He has no decreased breath sounds. He has no wheezes. He has no rhonchi. He has no rales.  Abdominal: Soft. Normal appearance and bowel sounds are normal. He exhibits no distension. There is no tenderness. There is no rigidity, no rebound, no guarding, no CVA tenderness, no tenderness at McBurney's point and negative Murphy's sign.  Musculoskeletal:       Right hip: He exhibits decreased range of motion (slightly with internal rotation) and decreased strength (hip flexion diminished). He exhibits no tenderness, no bony tenderness, no swelling, no crepitus and no deformity.       Thoracic back: Normal.       Lumbar back: Normal.  All spinal levels nonTTP without bony stepoffs or deformities, healed midline incision  over T4-6 region, FROM intact in spinal levels. R hip with slightly limited internal rotation ROM but otherwise ROM intact, with no bony or joint line TTP, no bruising or swelling, no crepitus or deformity, no limb length discrepancy or abnormal rotation. No pain with log roll testing. No pain with FABER or FADIR testing. Mild spasm felt in posterior hip/glute region, but nonTTP in this area. Hip flexion strength slightly diminished, but distal strength and sensation grossly intact, distal pulses intact, compartments soft. Unable to ambulate due to pain in hip  Neurological: He is alert and oriented to person, place, and time. He has normal strength. No sensory deficit.  Skin: Skin is warm, dry and intact. No rash noted.  Psychiatric: He has a normal mood and affect.  Nursing note and vitals reviewed.   Orthostatics: Orthostatic VS for the past 24 hrs:  BP- Lying Pulse- Lying BP- Sitting Pulse- Sitting BP- Standing at 0 minutes Pulse- Standing at 0 minutes  10/08/16 1552 117/76 78 105/70 83 127/78 94  10/08/16 1542 116/76 82 - - - -    ED Treatments / Results   Labs (all labs ordered are listed, but only abnormal results are displayed) Labs Reviewed  BASIC METABOLIC PANEL - Abnormal; Notable for the following:       Result Value   Glucose, Bld 134 (*)    BUN 21 (*)    All other components within normal limits  CBC - Abnormal; Notable for the following:    WBC 14.1 (*)    RBC 3.49 (*)    Hemoglobin 10.5 (*)    HCT 31.3 (*)    Platelets 427 (*)    All other components within normal limits  URINALYSIS, ROUTINE W REFLEX MICROSCOPIC - Abnormal; Notable for the following:    Ketones, ur 5 (*)    All other components within normal limits  CBG MONITORING, ED - Abnormal; Notable for the following:    Glucose-Capillary 125 (*)    All other components within normal limits  I-STAT TROPONIN, ED    EKG  EKG Interpretation  Date/Time:  Wednesday October 08 2016 13:01:22 EDT Ventricular Rate:  79 PR Interval:    QRS Duration: 93 QT Interval:  413 QTC Calculation: 474 R Axis:   65 Text Interpretation:  Sinus rhythm Borderline T wave abnormalities Confirmed by Virgel Manifold [phone]) on 10/08/2016 3:44:00 PM       Radiology Dg Hip Unilat W Or Wo Pelvis 2-3 Views Right  Result Date: 10/08/2016 CLINICAL DATA:  Increased right hip pain for the past 24 hours. Known multiple myeloma involving the right acetabulum. Syncopal episode today without a fall. EXAM: DG HIP (WITH OR WITHOUT PELVIS) 2-3V RIGHT COMPARISON:  Abdomen pelvis CT dated 09/03/2016. FINDINGS: The recently demonstrated multiple lucencies in the spine and pelvis are not well visualized radiographically. There are multiple small lucencies seen in the proximal femurs. No fracture or dislocation is seen. IMPRESSION: 1. No fracture or dislocation. 2. The recently demonstrated multiple lumbar spine and pelvic lucencies are better demonstrated on the recent CT. 3. Small lucencies in both proximal femurs, compatible with involvement of the patient's known multiple myeloma. Electronically Signed    By: Claudie Revering M.D.   On: 10/08/2016 16:32     CT chest/abd/pelv 09/03/16 Study Result: CLINICAL DATA:  Metastatic disease.  EXAM: CT CHEST, ABDOMEN AND PELVIS WITHOUT CONTRAST  TECHNIQUE: Multidetector CT imaging of the chest, abdomen and pelvis was performed following the standard protocol without IV contrast.  COMPARISON:  Ultrasound of same day.  MRI of September 02, 2016.  FINDINGS: CT CHEST FINDINGS  Cardiovascular: No significant vascular findings. Normal heart size. No pericardial effusion.  Mediastinum/Nodes: No enlarged mediastinal, hilar, or axillary lymph nodes. Thyroid gland, trachea, and esophagus demonstrate no significant findings.  Lungs/Pleura: Lungs are clear. No pleural effusion or pneumothorax.  Musculoskeletal: There is noted lucency involving the body and posterior elements of T5 consistent with malignancy. Multiple other smaller lucencies are noted throughout the thoracic spine, also concerning for metastatic disease or multiple myeloma. Lucency is also noted within the manubrium suggesting lesion as well.  CT ABDOMEN PELVIS FINDINGS  Hepatobiliary: No focal liver abnormality is seen. No gallstones, gallbladder wall thickening, or biliary dilatation.  Pancreas: Unremarkable. No pancreatic ductal dilatation or surrounding inflammatory changes.  Spleen: Normal in size without focal abnormality.  Adrenals/Urinary Tract: Adrenal glands appear normal. No hydronephrosis or renal obstruction is noted. No renal or ureteral calculi are noted. Foley catheter is noted within the urinary bladder.  Stomach/Bowel: The stomach appears normal. There is no evidence of bowel obstruction. Stool is noted throughout the colon. The appendix is not visualized.  Vascular/Lymphatic: No significant vascular findings are present. No enlarged abdominal or pelvic lymph nodes.  Reproductive: Prostate is unremarkable.  Other: No abdominal wall hernia or  abnormality. No abdominopelvic ascites.  Musculoskeletal: Multiple lucencies are noted throughout the spine and pelvis with the largest measuring 2.8 cm involving the posterior aspect of the right acetabulum. These are concerning for multiple myeloma or metastatic disease.  IMPRESSION: Multiple lucencies are noted throughout the spine and pelvis concerning for metastatic disease or multiple myeloma. The largest measures 2.8 cm in the posterior aspect of the right acetabulum.  No other abnormality seen in the chest, abdomen or pelvis.   Electronically Signed   By: Marijo Conception, M.D.   On: 09/03/2016 14:56     Procedures Procedures (including critical care time)  Medications Ordered in ED Medications  sodium chloride 0.9 % bolus 1,000 mL (0 mLs Intravenous Stopped 10/08/16 1806)  ketorolac (TORADOL) 30 MG/ML injection 15 mg (15 mg Intravenous Given 10/08/16 1549)  methylPREDNISolone sodium succinate (SOLU-MEDROL) 125 mg/2 mL injection 125 mg (125 mg Intravenous Given 10/08/16 1549)  fentaNYL (SUBLIMAZE) injection 50 mcg (50 mcg Intravenous Given 10/08/16 1807)  sodium chloride 0.9 % bolus 1,000 mL (1,000 mLs Intravenous New Bag/Given 10/08/16 1807)     Initial Impression / Assessment and Plan / ED Course  I have reviewed the triage vital signs and the nursing notes.  Pertinent labs & imaging results that were available during my care of the patient were reviewed by me and considered in my medical decision making (see chart for details).     66 y.o. male here with syncopal episode today while scooting down the stairs on his bottom; hadn't eaten anything today, took percocet and valium this morning, and shortly afterwards he had a syncopal event; wife was in front of him and caught him, no fall/head injury. LOC x1 min, then returned to baseline after 381m bolus fluids. BP on EMS arrival 98/60, improved after fluids, BP has been in 110-120s/60-70s, and pt feels much better, no  longer lightheaded. R hip pain is new x1 day, has known acetabular metastases however never had issues until now. Unable to ambulate due to pain. On exam, neg log roll testing, no pain with FABER/FADIR, mildly limited ROM with internal rotation, no focal area of tenderness, mild spasm in posterior hip/glutes. Distal strength intact,  however slightly limited hip flexion strength. No focal midline spinal TTP. NVI with soft compartments. Work up thus far: CBC with stable anemia and mild leukocytosis and thrombocytosis which is also stable. BMP WNL. Syncopal event was likely due to dehydration/medication side effects, will get orthostatics now and add-on troponin, but EKG nonischemic. Bigger concern is for this new onset R hip pain, could be pathologic fx considering known bony mets. Pt very hesitant to get imaging, and very hesitant to get any NSAIDs/steroids until we speak with oncology. Will reach out to oncology to get recommendations regarding this plan. Want to avoid narcotics for now considering his syncopal event, want to avoid any meds that would cause worsening lightheadedness or affect his BP. Will reassess after; Discussed case with my attending Dr. Wilson Singer who agrees with plan.   3:31 PM Dr. Burr Medico of Heme/Onc returning page, would recommend xray of R hip, feels okay with one time dose of toradol but doesn't recommend ongoing NSAID use; fine with steroid as well. Will have Dr. Alen Blew come evaluate pt while he's here as well. Will reassess shortly.   5:39 PM Trop neg. R hip Xray with no acute fx seen, small lucencies on lumbar spine and pelvis as well as proximal femurs also seen. Orthostatics borderline positive (although pt denies lightheadedness during that), 1L bolus given; BP continues to stay stable in the 110s/60s, will give another 1L bolus to continue helping with this. U/A pending. Pt states pain slightly better, but still unable to stand unassisted. Will attempt 58mg fentanyl since this  shouldn't affect his BP too much; will see how he does with this, and await U/A. Will reassess shortly. Of note, still awaiting Dr. SAlen Blewto come by, although pt states he has an appt with him tomorrow so if he doesn't arrive tonight then it'd be fine since he will see him tomorrow.   7:00 PM U/A with few ketones, likely related to mild dehydration; otherwise unremarkable. Pt feeling better and able to ambulate with minimal support (nursing staff assisted as if they were a cane, which he uses at home). Continues to deny lightheadedness or further complaints at this time. BP remains stable 120s/70s at this time. Dr. SAlen Blewhas not come by, but pt has appt with him tomorrow so he doesn't feel the need to continue to wait for him to come today. Feels ready to go home. Advised use of heat/ice, discussed avoiding overuse that would result in worsening pain. Discussed adequate hydration and rest, and encouraged PO intake before taking any medications. Will refill his rx for valium and narcotic since he's running out, advised proper use of them, will also start short course of steroid to help with pain since he can't use NSAIDs frequently; advised f/up with oncology tomorrow at his already scheduled appt for recheck of symptoms and ongoing management. Discussed use of OTC meds to help with constipation related to his narcotics.  [place], and 1 year search was notable for: vicodin 5-3251m#20 tabs on 02/15/16; valium 47m12m30 tabs on 09/06/16; percocet 7.5-3247mg #60 tabs on 09/06/16; and percocet 5-3247m72m0 tabs on 09/27/16. Risks/benefits/alternatives and expectations discussed regarding controlled substances. Side effects of medications discussed. Informed consent obtained.  I explained the diagnosis and have given explicit precautions to return to the ER including for any other new or worsening symptoms. The patient understands and accepts the medical  plan as it's been dictated and I have answered their questions. Discharge instructions  concerning home care and prescriptions have been given. The patient is STABLE and is discharged to home in good condition.    Final Clinical Impressions(s) / ED Diagnoses   Final diagnoses:  Orthostatic syncope  Right hip pain  Multiple myeloma not having achieved remission (HCC)  Pain due to malignant neoplasm metastatic to bone (HCC)  Chronic anemia  Thrombocytosis (HCC)  Muscle spasm  Drug-induced constipation    New Prescriptions New Prescriptions   DIAZEPAM (VALIUM) 5 MG TABLET    Take 1 tablet (5 mg total) by mouth every 8 (eight) hours as needed for muscle spasms. Take 0.5-1 tablet (2.5-57m) PO q8h PRN muscle spasms   OXYCODONE-ACETAMINOPHEN (PERCOCET) 5-325 MG TABLET    Take 1-2 tablets by mouth every 6 (six) hours as needed for severe pain.   PREDNISONE (DELTASONE) 20 MG TABLET    3 tabs po daily x 3 days starting 10/09/16     Arloa Prak, MEast [place]09/05/18 1902    KVirgel Manifold MD 10/09/16 [phone]

## 2016-10-08 NOTE — Discharge Instructions (Signed)
The episode of passing out that you experienced today was likely related to mild dehydration in addition to taking your home narcotic and valium on an empty stomach. Make sure you're staying very well hydrated. Get plenty of rest. Use percocet as directed as needed for pain, do not drive or operate machinery while taking this medication. You may need a stool softener such as miralax or colace while taking this medication in order to help with any constipation you may develop from the narcotics. Continue to use your home valium as well, to help with muscle spasms/pain. Use prednisone as directed until completed, starting tomorrow since you were given today's dose here. Use ice and heat to the area of pain, no more than 20 minutes per hour for each. Follow up with your oncologist tomorrow for your already scheduled visit, and for recheck of symptoms. Return to the ER for emergent changes or worsening symptoms.

## 2016-10-08 NOTE — ED Notes (Signed)
Pt ambulatory and independent at discharge.  Wheeled to family vehicle. Verbalized understanding of discharge instructions.

## 2016-10-09 ENCOUNTER — Ambulatory Visit (HOSPITAL_BASED_OUTPATIENT_CLINIC_OR_DEPARTMENT_OTHER): Payer: Medicare Other | Admitting: Oncology

## 2016-10-09 ENCOUNTER — Telehealth: Payer: Self-pay

## 2016-10-09 ENCOUNTER — Ambulatory Visit
Admission: RE | Admit: 2016-10-09 | Discharge: 2016-10-09 | Disposition: A | Discharge status: Home / Self Care | Payer: Medicare Other | Source: Ambulatory Visit | Attending: Radiation Oncology | Admitting: Radiation Oncology

## 2016-10-09 VITALS — BP 130/75 | HR 107 | Temp 98.4°F | Resp 20 | Ht 72.25 in | Wt 189.3 lb

## 2016-10-09 DIAGNOSIS — R531 Weakness: Secondary | ICD-10-CM | POA: Diagnosis not present

## 2016-10-09 DIAGNOSIS — D649 Anemia, unspecified: Secondary | ICD-10-CM

## 2016-10-09 DIAGNOSIS — C9000 Multiple myeloma not having achieved remission: Secondary | ICD-10-CM | POA: Diagnosis not present

## 2016-10-09 DIAGNOSIS — R200 Anesthesia of skin: Secondary | ICD-10-CM | POA: Diagnosis not present

## 2016-10-09 DIAGNOSIS — Z510 Encounter for antineoplastic radiation therapy: Secondary | ICD-10-CM | POA: Diagnosis not present

## 2016-10-09 DIAGNOSIS — M898X9 Other specified disorders of bone, unspecified site: Secondary | ICD-10-CM | POA: Diagnosis not present

## 2016-10-09 DIAGNOSIS — Z8249 Family history of ischemic heart disease and other diseases of the circulatory system: Secondary | ICD-10-CM | POA: Diagnosis not present

## 2016-10-09 DIAGNOSIS — Z79899 Other long term (current) drug therapy: Secondary | ICD-10-CM | POA: Diagnosis not present

## 2016-10-09 MED ORDER — PROCHLORPERAZINE MALEATE 10 MG PO TABS: 10.0000 mg | ORAL_TABLET | Freq: Four times a day (QID) | ORAL | 0 refills | Status: DC | PRN

## 2016-10-09 MED ORDER — CYCLOPHOSPHAMIDE 50 MG PO CAPS: ORAL_CAPSULE | ORAL | 3 refills | Status: DC

## 2016-10-09 MED ORDER — DEXAMETHASONE 4 MG PO TABS: ORAL_TABLET | ORAL | 3 refills | Status: DC

## 2016-10-09 NOTE — Progress Notes (Signed)
START ON PATHWAY REGIMEN - Multiple Myeloma and Other Plasma Cell Dyscrasias     A cycle is every 21 days:     Bortezomib      Cyclophosphamide      Dexamethasone   **Always confirm dose/schedule in your pharmacy ordering system**    Patient Characteristics: Newly Diagnosed, Transplant Eligible, Standard Risk R-ISS Staging: III Disease Classification: Newly Diagnosed Is Patient Eligible for Transplant<= Transplant Eligible Risk Status: Standard Risk Intent of Therapy: Non-Curative / Palliative Intent, Discussed with Patient

## 2016-10-09 NOTE — Progress Notes (Signed)
Hematology and Oncology Follow Up Visit  Alexander Saunders [id number] 1951/01/14 66 y.o. 10/09/2016 4:48 PM [name], Luiz Blare, [name], MD   Principle Diagnosis: 66 year old gentleman diagnosed with multiple myeloma in July 2018. 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 His workup revealed diffuse bony involvement, 10% plasma myeloma in the bone marrow. He had a 0.1 g/dL M spike but no heavy chain immunofixation noted. His kappa light chain was elevated with an increased ratio of 4.25. His cytogenetics are currently pending.   Prior Therapy:  He is status post surgical decompression of an epidural mass on 09/04/2016 and the pathology showed a plasma cell neoplasm.  Bone marrow biopsy obtained on 10/02/2016 showed 10% plasma cell involvement.  Current therapy:   He is receiving adjuvant radiation therapy to the thoracic spine to be completed on 10/14/2016.  He is under consideration to start systemic therapy.  Interim History:  [name] presents today for a follow-up visit. This last visit, he started radiation therapy and has tolerated it reasonably well. He reported symptoms of fatigue and tiredness but has been manageable. He try to wean himself off pain medication and developed increased hip pain that landed him in the emergency department. His hip x-ray did not show any fractures.   He also received Zometa 09/26/2016 and developed periorbital edema afterwards. His workup including CT scan and MRI did not show any tumor infiltration. His right orbital edema have subsided and nearly resolved at this time. His appetite is improving and he is trying to eat better at this time. His mobility is also has improved and ambulating without much help. He is keen on restarting his exercise and currently exercises but once a week. His pain has been manageable at this time with very little pain medication.  He does not report any  headaches, blurry vision, syncope or seizures. He does not report any fevers, chills, sweats or weight loss. His appetite has been excellent and have gained weight on steroids. He does not report any chest pain, palpitation, orthopnea or leg edema. He does not report any cough, wheezing or hemoptysis. He does not report an disoriented exertion. He does not report any nausea, vomiting, abdominal pain, constipation, diarrhea or hematochezia. He does not report any frequency, urgency or loss of bladder control. He does not report any lymphadenopathy or petechiae. He does not report any pathological fractures. Remaining review of systems unremarkable  Medications: I have reviewed the patient's current medications.  Current Outpatient Prescriptions  Medication Sig Dispense Refill  . bisacodyl (DULCOLAX) 10 MG suppository Place 1 suppository (10 mg total) rectally daily as needed for moderate constipation. 12 suppository 0  . diazepam (VALIUM) 5 MG tablet Take 1 tablet (5 mg total) by mouth every 6 (six) hours as needed for muscle spasms. 30 tablet 0  . diazepam (VALIUM) 5 MG tablet Take 1 tablet (5 mg total) by mouth every 8 (eight) hours as needed for muscle spasms. Take 0.5-1 tablet (2.5-'5mg'$ ) PO q8h PRN muscle spasms 10 tablet 0  . diphenhydrAMINE (BENADRYL) 25 MG tablet Take 25 mg by mouth every 6 (six) hours as needed for itching or allergies.     [name] gabapentin (NEURONTIN) 300 MG capsule Take 1 capsule (300 mg total) by mouth 3 (three) times daily. 90 capsule 3  . gentamicin (GARAMYCIN) 0.3 % ophthalmic solution Place 2 drops into both eyes 4 (four) times daily. 5 mL 0  . loratadine (CLARITIN) 10 MG tablet Take  10 mg by mouth daily as needed for allergies.    [name] menthol-cetylpyridinium (CEPACOL) 3 MG lozenge Take 1 lozenge (3 mg total) by mouth as needed for sore throat (sore throat). 100 tablet 12  . oxyCODONE-acetaminophen (PERCOCET) 5-325 MG tablet Take 1-2 tablets by mouth every 6 (six) hours as needed  for severe pain. 15 tablet 0  . oxyCODONE-acetaminophen (PERCOCET) 7.5-325 MG tablet Take 1 tablet by mouth every 4 (four) hours as needed for severe pain. 60 tablet 0  . oxyCODONE-acetaminophen (PERCOCET/ROXICET) 5-325 MG tablet Take 1 tablet by mouth every 6 (six) hours as needed for pain.    . pantoprazole (PROTONIX) 40 MG tablet Take 1 tablet (40 mg total) by mouth daily. 30 tablet 0  . polyethylene glycol (MIRALAX / GLYCOLAX) packet Take 17 g by mouth 2 (two) times daily. 14 each 0  . predniSONE (DELTASONE) 20 MG tablet 3 tabs po daily x 3 days starting 10/09/16 9 tablet 0  . senna-docusate (SENOKOT-S) 8.6-50 MG tablet Take 1 tablet by mouth 2 (two) times daily. 60 tablet 0  . tamsulosin (FLOMAX) 0.4 MG CAPS capsule Take 1 capsule (0.4 mg total) by mouth daily. 30 capsule 0  . UNKNOWN TO PATIENT Place 2 drops into the right eye daily. Non-steroidal for inflammation.    . cyclophosphamide (CYTOXAN) 50 MG capsule Take 12 caps weekly on the day of chemotherapy.Give on an empty stomach 1 hour before or 2 hours after meals. 48 capsule 3  . dexamethasone (DECADRON) 4 MG tablet Take 5 tablets weekly with chemotherapy. 60 tablet 3  . prochlorperazine (COMPAZINE) 10 MG tablet Take 1 tablet (10 mg total) by mouth every 6 (six) hours as needed for nausea or vomiting. 30 tablet 0   No current facility-administered medications for this visit.      Allergies: No Known Allergies  Past Medical History, Surgical history, Social history, and Family History were reviewed and updated.  Physical Exam: Blood pressure 130/75, pulse (!) 107, temperature 98.4 F (36.9 C), temperature source Oral, resp. rate 20, height 6' 0.25" (1.835 m), weight 189 lb 4.8 oz (85.9 kg), SpO2 100 %. ECOG: 1 General appearance: alert and cooperative Head: Normocephalic, without obvious abnormality Neck: no adenopathy Lymph nodes: Cervical, supraclavicular, and axillary nodes normal. Heart:regular rate and rhythm, S1, S2 normal,  no murmur, click, rub or gallop Lung:chest clear, no wheezing, rales, normal symmetric air entry Abdomin: soft, non-tender, without masses or organomegaly EXT:no erythema, induration, or nodules   Lab Results: Lab Results  Component Value Date   WBC 14.1 (H) 10/08/2016   HGB 10.5 (L) 10/08/2016   HCT 31.3 (L) 10/08/2016   MCV 89.7 10/08/2016   PLT 427 (H) 10/08/2016     Chemistry      Component Value Date/Time   NA 137 10/08/2016 1304   NA 139 09/29/2016 1546   K 4.1 10/08/2016 1304   K 3.8 09/29/2016 1546   CL 104 10/08/2016 1304   CO2 26 10/08/2016 1304   CO2 23 09/29/2016 1546   BUN 21 (H) 10/08/2016 1304   BUN 10.7 09/29/2016 1546   CREATININE 0.93 10/08/2016 1304   CREATININE 0.9 09/29/2016 1546      Component Value Date/Time   CALCIUM 9.4 10/08/2016 1304   CALCIUM 9.1 09/29/2016 1546   ALKPHOS 62 09/29/2016 1546   AST 25 09/29/2016 1546   ALT 32 09/29/2016 1546   BILITOT 0.51 09/29/2016 1546       Radiological Studies: Dg Hip Unilat W Or Wo  Pelvis 2-3 Views Right  Result Date: 10/08/2016 CLINICAL DATA:  Increased right hip pain for the past 24 hours. Known multiple myeloma involving the right acetabulum. Syncopal episode today without a fall. EXAM: DG HIP (WITH OR WITHOUT PELVIS) 2-3V RIGHT COMPARISON:  Abdomen pelvis CT dated 09/03/2016. FINDINGS: The recently demonstrated multiple lucencies in the spine and pelvis are not well visualized radiographically. There are multiple small lucencies seen in the proximal femurs. No fracture or dislocation is seen. IMPRESSION: 1. No fracture or dislocation. 2. The recently demonstrated multiple lumbar spine and pelvic lucencies are better demonstrated on the recent CT. 3. Small lucencies in both proximal femurs, compatible with involvement of the patient's known multiple myeloma. Electronically Signed   By: Claudie Revering M.D.   On: 10/08/2016 16:32     Impression and Plan:  67 year old gentleman with the following  issues:  1. Plasma cell neoplasm diagnosed and July 2018. He presented with lower extremity weakness and found to have a epidural mass around T4-T6 that required urgent surgical decompression on 09/04/2016. The follow-up pathology confirmed the presence of a plasma cell neoplasm. CT scan of the chest abdomen and pelvis showed no solid tumor malignancy but diffuse bone involvement including a 2.8 cm in the posterior aspect of the right acetabulum. Bone marrow biopsy showed 10% plasma cell neoplasm and increased Kappa Light chain in the blood.  He is set to complete radiation therapy on 10/14/2016.  The natural course of this disease was reviewed again with the patient and his wife. Although this is an incurable malignancy aggressive treatment is warranted at this time which offer him possible remission and long term disease control. He understands that might require multi agent regimen and possible consolidation with stem cell transplant. He is cytogenetics are currently pending to fully characterize his multiple myeloma.  He would be an excellent candidate for combination of Cytoxan at 300 mg/m and total of 600 mg, dexamethasone at 20 mg and Velcade at 1.5 mg/m subcutaneous to be given weekly. Complications associated with this chemotherapy was discussed today. This would include nausea, vomiting, fatigue, myelosuppression, renal insufficiency, peripheral neuropathy among others. The benefit is to achieve complete remission and possible long-term disease control.  After discussion today, he is agreeable to proceed after chemotherapy education class.  2. Epidural tumor compression: He status post surgical decompression accomplished on 09/04/2016. He is completing radiation therapy adjuvantly. Last fraction scheduled for 10/14/2016.  3. Bone health: He received the first of Zometa on 09/26/2016 and will be repeated on a monthly basis.  4. Bone pain: Manageable at this time with Percocet. He will  continue to wean himself off of this pain medication.  5. Renal function: His kidney function appears within normal range.  6. Anemia: Very mild at this time and likely related to plasma cell disorder. He does not require any transfusion or growth factor support.  7. Prognosis: This was discussed today. He remains an excellent performance status and this is a treatable malignancy and achieving long-term remission would be the goal.  8. Follow-up: Next 2 weeks to start therapy.      Zola Button, MD 9/6/20184:48 PM

## 2016-10-09 NOTE — Telephone Encounter (Signed)
Gave patient avs and calender for September and October. Per los 

## 2016-10-10 ENCOUNTER — Telehealth: Payer: Self-pay

## 2016-10-10 ENCOUNTER — Ambulatory Visit
Admission: RE | Admit: 2016-10-10 | Discharge: 2016-10-10 | Disposition: A | Discharge status: Home / Self Care | Payer: Medicare Other | Source: Ambulatory Visit | Attending: Radiation Oncology | Admitting: Radiation Oncology

## 2016-10-10 DIAGNOSIS — R531 Weakness: Secondary | ICD-10-CM | POA: Diagnosis not present

## 2016-10-10 DIAGNOSIS — Z79899 Other long term (current) drug therapy: Secondary | ICD-10-CM | POA: Diagnosis not present

## 2016-10-10 DIAGNOSIS — Z8249 Family history of ischemic heart disease and other diseases of the circulatory system: Secondary | ICD-10-CM | POA: Diagnosis not present

## 2016-10-10 DIAGNOSIS — R200 Anesthesia of skin: Secondary | ICD-10-CM | POA: Diagnosis not present

## 2016-10-10 DIAGNOSIS — C9000 Multiple myeloma not having achieved remission: Secondary | ICD-10-CM | POA: Diagnosis not present

## 2016-10-10 DIAGNOSIS — Z510 Encounter for antineoplastic radiation therapy: Secondary | ICD-10-CM | POA: Diagnosis not present

## 2016-10-10 NOTE — Telephone Encounter (Signed)
Recalled patient, and mailed a letter. Per 9/6 los

## 2016-10-10 NOTE — Telephone Encounter (Signed)
Left a voice message concerning upcoming appointment for 9/12 @11am  [employer]. Per los

## 2016-10-13 ENCOUNTER — Telehealth: Payer: Self-pay | Admitting: Pharmacist

## 2016-10-13 ENCOUNTER — Ambulatory Visit
Admission: RE | Admit: 2016-10-13 | Discharge: 2016-10-13 | Disposition: A | Discharge status: Home / Self Care | Payer: Medicare Other | Source: Ambulatory Visit | Attending: Radiation Oncology | Admitting: Radiation Oncology

## 2016-10-13 ENCOUNTER — Ambulatory Visit: Payer: Medicare Other

## 2016-10-13 DIAGNOSIS — Z510 Encounter for antineoplastic radiation therapy: Secondary | ICD-10-CM | POA: Diagnosis not present

## 2016-10-13 DIAGNOSIS — Z79899 Other long term (current) drug therapy: Secondary | ICD-10-CM | POA: Diagnosis not present

## 2016-10-13 DIAGNOSIS — R200 Anesthesia of skin: Secondary | ICD-10-CM | POA: Diagnosis not present

## 2016-10-13 DIAGNOSIS — Z8249 Family history of ischemic heart disease and other diseases of the circulatory system: Secondary | ICD-10-CM | POA: Diagnosis not present

## 2016-10-13 DIAGNOSIS — R531 Weakness: Secondary | ICD-10-CM | POA: Diagnosis not present

## 2016-10-13 DIAGNOSIS — C9000 Multiple myeloma not having achieved remission: Secondary | ICD-10-CM

## 2016-10-13 MED ORDER — CYCLOPHOSPHAMIDE 50 MG PO CAPS: ORAL_CAPSULE | ORAL | 3 refills | Status: DC

## 2016-10-13 NOTE — Telephone Encounter (Signed)
Oral Oncology Pharmacist Encounter  Received new prescription for oral cyclophosphamide for the treatment of newly diagnosed multiple myeloma in conjunction with Velcade and dexamethasone, planned duration until disease progression or unacceptable toxicity.  Recent labs from Epic assessed, [place] for treatment.  Current medication list in Epic reviewed, no DDIs with cyclophosphamide identified. No daily acyclovir for VZV prophylaxis for Velcade noted on medication list. This will be discussed with MD.  Prescription has been e-scribed to the [facility] for benefits analysis and approval. No prior authorization is required. Copayment is $11 per 28-day fill.  I LVM for patient for update on prescription status and offer for initial counseling. Noted patient in the office tomorrow (10/14/16) at 9:30am for chemotherapy education class.  Oral Oncology Clinic will continue to follow for initial counseling and start date.  Johny Drilling, PharmD, BCPS, BCOP 10/13/2016 1:51 PM Oral Oncology Clinic [phone]

## 2016-10-14 ENCOUNTER — Other Ambulatory Visit: Payer: Medicare Other

## 2016-10-14 ENCOUNTER — Encounter: Payer: Self-pay | Admitting: Radiation Oncology

## 2016-10-14 ENCOUNTER — Ambulatory Visit: Payer: Medicare Other

## 2016-10-14 ENCOUNTER — Encounter: Payer: Self-pay | Admitting: *Deleted

## 2016-10-14 ENCOUNTER — Ambulatory Visit
Admission: RE | Admit: 2016-10-14 | Discharge: 2016-10-14 | Disposition: A | Discharge status: Home / Self Care | Payer: Medicare Other | Source: Ambulatory Visit | Attending: Radiation Oncology | Admitting: Radiation Oncology

## 2016-10-14 DIAGNOSIS — R531 Weakness: Secondary | ICD-10-CM | POA: Diagnosis not present

## 2016-10-14 DIAGNOSIS — R200 Anesthesia of skin: Secondary | ICD-10-CM | POA: Diagnosis not present

## 2016-10-14 DIAGNOSIS — Z510 Encounter for antineoplastic radiation therapy: Secondary | ICD-10-CM | POA: Diagnosis not present

## 2016-10-14 DIAGNOSIS — C7951 Secondary malignant neoplasm of bone: Secondary | ICD-10-CM | POA: Diagnosis not present

## 2016-10-14 DIAGNOSIS — C9000 Multiple myeloma not having achieved remission: Secondary | ICD-10-CM | POA: Diagnosis not present

## 2016-10-14 DIAGNOSIS — Z8249 Family history of ischemic heart disease and other diseases of the circulatory system: Secondary | ICD-10-CM | POA: Diagnosis not present

## 2016-10-14 DIAGNOSIS — Z79899 Other long term (current) drug therapy: Secondary | ICD-10-CM | POA: Diagnosis not present

## 2016-10-14 MED ORDER — DEXAMETHASONE 4 MG PO TABS: ORAL_TABLET | ORAL | 3 refills | Status: DC

## 2016-10-14 MED ORDER — ACYCLOVIR 400 MG PO TABS: 400.0000 mg | ORAL_TABLET | Freq: Two times a day (BID) | ORAL | 3 refills | Status: DC

## 2016-10-14 MED ORDER — PROCHLORPERAZINE MALEATE 10 MG PO TABS: 10.0000 mg | ORAL_TABLET | Freq: Four times a day (QID) | ORAL | 0 refills | Status: DC | PRN

## 2016-10-14 MED FILL — ACYCLOVIR 400 MG TABLET: 400 | 30 days supply | Qty: 60 | Fill #0

## 2016-10-14 MED FILL — CYCLOPHOSPHAMIDE 50 MG CAP: 50 | 28 days supply | Qty: 48 | Fill #0

## 2016-10-14 MED FILL — DEXAMETHASONE 4 MG TABLET: 4 | 84 days supply | Qty: 60 | Fill #0

## 2016-10-14 MED FILL — PROCHLORPERAZINE 10 MG TAB: 10 | 8 days supply | Qty: 30 | Fill #0

## 2016-10-14 NOTE — Telephone Encounter (Signed)
Oral Chemotherapy Pharmacist Encounter   I spoke with patient and wife in office for overview of new oral chemotherapy medication: cyclophosphamide for the treatment of newly diagnosed multiple myeloma in conjunction with Velcade and dexamethasone, planned duration until disease progression or unacceptable toxicity.   Pt is doing well. The prescription is being processed at the [facility]. Copayment $11 / one month supply, this is affordable for the patient. Prescriptions for acyclovir, dexamethasone, and prochlorperazine have also been sent to the pharmacy for shipment with he cyclophosphamide directly to the patient's home. The pharmacy will coordinate this with the patient for delivery to patient's home prior to 10/24/16 start date.   Counseled patient on administration, dosing, side effects, safe handling, and monitoring. Patient will take cyclophosphamide 72m capsules, 12 capsules (6062m by mouth once weekly, to be given on the days of Velcade injection. Patient will take cyclophosphamide without regard to food, and will take it early in the day and maintain adequate hydration throughout the day.  Side effects include but not limited to: decreased blood counts, nausea, vomiting, diarrhea, hair loss, and hemorrhagic cystitis.    Reviewed with patient importance of keeping a medication schedule and plan for any missed doses.  Mr. and Mrs. Willets voiced understanding and appreciation.   All questions answered.  Patient knows to call the office with questions or concerns. Oral Oncology Clinic will continue to follow.  Thank you,  JeJohny DrillingPharmD, BCPS, BCOP 10/14/2016  3:41 PM Oral Oncology Clinic [phone]

## 2016-10-14 NOTE — Progress Notes (Signed)
Nutrition Assessment  Reason for Assessment: Pt was identified on the Malnutrition Screening Tool for weigth loss  ASSESSMENT:  66 y/o male with multiple myeloma followed by Dr. Alen Blew. Pt is s/p surgical decompression of epidural mass on 09/04/16. Of note, pt's final radiation treatment is today 10/14/16. Pt will begin chemotherapy infusion on 10/24/16. Per MD note, pt was educated on common symptoms/complications associated with this treatment. Intent of therapy is non-curative.   Met with pt in clinic with wife present. Patient reports significant weight loss, of 12 lbs, over the past month. Per weight history recordings, patient's weight has decreased from 199 lbs to 189 lbs since 09/18/16 to today, 5% weight loss in 1 month.  Patient reports chronic constipation, thought to be related to patient's medications. Patient manages with herbal tea and medicine. Patient reports slight indigestion, managed by TUMS. Patient denies all other symptoms at this time.   Patient reports consuming three meals per day and intermittent snacks. RD provided examples of high protein, high calorie snacks.  Patient reports consuming home made protein shakes consisting of protein powder, almond milk and fruit. Patient inquired about nutritional supplements stating he has never tried them before. RD discussed benefits to nutritional supplements and provided patient samples.   Nutrition Focused Physical Exam: deferred   Medications: Dulcolax, Decadron, Protonix, Miralax, Prednisone, Senokot-S  Labs: CBG 125, BUN 21  Anthropometrics:   Height: 6'0.25'' Weight: 189 lbs UBW: 190 lbs but was recently around ~200 lb range BMI: 25.5 kg/m^2  Estimated Energy Needs  Kcals: 2500-3000 Protein: 125-150 grams Fluid: >/= 2.5 L/d  NUTRITION DIAGNOSIS: Unintentional weight loss related to cancer and cancer related symptoms as evidenced by 5% weight loss in 1 month, per pt report   INTERVENTION:   Discussed the  importance of good oral intake. Promoted high calorie, high protein snacks, provided examples. Promoted consumption and finding nutritional supplements patient enjoys, provided samples Suggested small frequent meals to promote PO intake    MONITORING, EVALUATION, GOAL: Patient will consume adequate calories and protein to meet 90% or greater of estimated nutrition needs   NEXT VISIT: Friday October 5th, during infusion  [place], [place], [place], [facility] 10/14/2016 1:45 PM

## 2016-10-15 ENCOUNTER — Ambulatory Visit (HOSPITAL_COMMUNITY)
Admission: RE | Admit: 2016-10-15 | Discharge: 2016-10-15 | Disposition: A | Discharge status: Home / Self Care | Payer: Medicare Other | Source: Ambulatory Visit | Attending: Oncology | Admitting: Oncology

## 2016-10-15 DIAGNOSIS — C9000 Multiple myeloma not having achieved remission: Secondary | ICD-10-CM | POA: Diagnosis not present

## 2016-10-15 NOTE — Telephone Encounter (Addendum)
Oral Oncology Patient Advocate Encounter  Spoke with [name] this morning.  Reviewed copay information for Dexamethasone, Cyclophosphamide, Prochloroperazine and Acyclovir.    Mr and Mrs Marrow requested that all medications be shipped to their home.  [place] collected payment information from Mrs. Blow today.  The medications will be shipped on 10-15-16 for delivery on 10-16-16.    [name]. Melynda Keller, [place] Patient [place] [phone] 10/15/2016 10:25 AM

## 2016-10-16 DIAGNOSIS — Z4789 Encounter for other orthopedic aftercare: Secondary | ICD-10-CM | POA: Diagnosis not present

## 2016-10-16 DIAGNOSIS — D649 Anemia, unspecified: Secondary | ICD-10-CM | POA: Diagnosis not present

## 2016-10-16 DIAGNOSIS — G959 Disease of spinal cord, unspecified: Secondary | ICD-10-CM | POA: Diagnosis not present

## 2016-10-16 DIAGNOSIS — R339 Retention of urine, unspecified: Secondary | ICD-10-CM | POA: Diagnosis not present

## 2016-10-16 DIAGNOSIS — M6281 Muscle weakness (generalized): Secondary | ICD-10-CM | POA: Diagnosis not present

## 2016-10-16 DIAGNOSIS — G9529 Other cord compression: Secondary | ICD-10-CM | POA: Diagnosis not present

## 2016-10-20 DIAGNOSIS — M6281 Muscle weakness (generalized): Secondary | ICD-10-CM | POA: Diagnosis not present

## 2016-10-20 DIAGNOSIS — Z4789 Encounter for other orthopedic aftercare: Secondary | ICD-10-CM | POA: Diagnosis not present

## 2016-10-20 DIAGNOSIS — G959 Disease of spinal cord, unspecified: Secondary | ICD-10-CM | POA: Diagnosis not present

## 2016-10-20 DIAGNOSIS — D649 Anemia, unspecified: Secondary | ICD-10-CM | POA: Diagnosis not present

## 2016-10-20 DIAGNOSIS — R339 Retention of urine, unspecified: Secondary | ICD-10-CM | POA: Diagnosis not present

## 2016-10-20 DIAGNOSIS — G9529 Other cord compression: Secondary | ICD-10-CM | POA: Diagnosis not present

## 2016-10-24 ENCOUNTER — Ambulatory Visit (HOSPITAL_BASED_OUTPATIENT_CLINIC_OR_DEPARTMENT_OTHER): Payer: Medicare Other

## 2016-10-24 ENCOUNTER — Other Ambulatory Visit (HOSPITAL_BASED_OUTPATIENT_CLINIC_OR_DEPARTMENT_OTHER): Payer: Medicare Other

## 2016-10-24 VITALS — BP 130/74 | HR 90 | Temp 97.9°F | Resp 18

## 2016-10-24 DIAGNOSIS — C9000 Multiple myeloma not having achieved remission: Secondary | ICD-10-CM | POA: Diagnosis present

## 2016-10-24 DIAGNOSIS — Z5112 Encounter for antineoplastic immunotherapy: Secondary | ICD-10-CM | POA: Diagnosis present

## 2016-10-24 LAB — CBC WITH DIFFERENTIAL/PLATELET
BASO%: 0.5 % (ref 0.0–2.0)
Basophils Absolute: 0 10*3/uL (ref 0.0–0.1)
EOS%: 3.3 % (ref 0.0–7.0)
Eosinophils Absolute: 0.2 10*3/uL (ref 0.0–0.5)
HCT: 34.6 % — ABNORMAL LOW (ref 38.4–49.9)
HGB: 11.7 g/dL — ABNORMAL LOW (ref 13.0–17.1)
LYMPH%: 9.6 % — ABNORMAL LOW (ref 14.0–49.0)
MCH: 31.5 pg (ref 27.2–33.4)
MCHC: 33.8 g/dL (ref 32.0–36.0)
MCV: 93.1 fL (ref 79.3–98.0)
MONO#: 0.8 10*3/uL (ref 0.1–0.9)
MONO%: 12.4 % (ref 0.0–14.0)
NEUT#: 4.8 10*3/uL (ref 1.5–6.5)
NEUT%: 74.2 % (ref 39.0–75.0)
Platelets: 236 10*3/uL (ref 140–400)
RBC: 3.71 10*6/uL — ABNORMAL LOW (ref 4.20–5.82)
RDW: 15.3 % — ABNORMAL HIGH (ref 11.0–14.6)
WBC: 6.5 10*3/uL (ref 4.0–10.3)
lymph#: 0.6 10*3/uL — ABNORMAL LOW (ref 0.9–3.3)

## 2016-10-24 LAB — COMPREHENSIVE METABOLIC PANEL
ALT: 10 U/L (ref 0–55)
AST: 15 U/L (ref 5–34)
Albumin: 3.8 g/dL (ref 3.5–5.0)
Alkaline Phosphatase: 42 U/L (ref 40–150)
Anion Gap: 11 mEq/L (ref 3–11)
BUN: 13.6 mg/dL (ref 7.0–26.0)
CO2: 25 mEq/L (ref 22–29)
Calcium: 9.8 mg/dL (ref 8.4–10.4)
Chloride: 102 mEq/L (ref 98–109)
Creatinine: 1 mg/dL (ref 0.7–1.3)
EGFR: >90 mL/min/{1.73_m2} (ref >90)
Glucose: 145 mg/dl — ABNORMAL HIGH (ref 70–140)
Potassium: 4.2 mEq/L (ref 3.5–5.1)
Sodium: 138 mEq/L (ref 136–145)
Total Bilirubin: 0.91 mg/dL (ref 0.20–1.20)
Total Protein: 7 g/dL (ref 6.4–8.3)

## 2016-10-24 MED ORDER — BORTEZOMIB CHEMO SQ INJECTION 3.5 MG (2.5MG/ML)
1.5000 mg/m2 | Freq: Once | INTRAMUSCULAR | Status: AC
  Administered 2016-10-24: 3.25 mg via SUBCUTANEOUS
  Filled 2016-10-24: qty 3.25

## 2016-10-24 MED ORDER — PROCHLORPERAZINE MALEATE 10 MG PO TABS
ORAL_TABLET | ORAL | Status: AC
  Filled 2016-10-24: qty 1

## 2016-10-24 MED ORDER — ZOLEDRONIC ACID 4 MG/100ML IV SOLN
4.0000 mg | Freq: Once | INTRAVENOUS | Status: AC
  Administered 2016-10-24: 4 mg via INTRAVENOUS
  Filled 2016-10-24: qty 100

## 2016-10-24 MED ORDER — PROCHLORPERAZINE MALEATE 10 MG PO TABS
10.0000 mg | ORAL_TABLET | Freq: Once | ORAL | Status: AC
  Administered 2016-10-24: 10 mg via ORAL

## 2016-10-24 MED ORDER — SODIUM CHLORIDE 0.9 % IV SOLN
Freq: Once | INTRAVENOUS | Status: AC
  Administered 2016-10-24: 09:00:00 via INTRAVENOUS

## 2016-10-24 NOTE — Patient Instructions (Signed)
[facility] Discharge Instructions for Patients Receiving Chemotherapy  Today you received the following chemotherapy agent: Bortezimib/Velcade.  To help prevent nausea and vomiting after your treatment, we encourage you to take your nausea medication as directed. You can take your compazine every 6 hours as needed.  If you develop nausea and vomiting that is not controlled by your nausea medication, call the clinic.   BELOW ARE SYMPTOMS THAT SHOULD BE REPORTED IMMEDIATELY:  *FEVER GREATER THAN 100.5 F  *CHILLS WITH OR WITHOUT FEVER  NAUSEA AND VOMITING THAT IS NOT CONTROLLED WITH YOUR NAUSEA MEDICATION  *UNUSUAL SHORTNESS OF BREATH  *UNUSUAL BRUISING OR BLEEDING  TENDERNESS IN MOUTH AND THROAT WITH OR WITHOUT PRESENCE OF ULCERS  *URINARY PROBLEMS  *BOWEL PROBLEMS  UNUSUAL RASH Items with * indicate a potential emergency and should be followed up as soon as possible.  Feel free to call the clinic should you have any questions or concerns. The clinic phone number is [phone].  Please show the [place] at check-in to the Emergency Department and triage nurse.    Bortezomib (Velcade) injection What is this medicine? BORTEZOMIB (bor TEZ oh mib) is a medicine that targets proteins in cancer cells and stops the cancer cells from growing. It is used to treat multiple myeloma and mantle-cell lymphoma. This medicine may be used for other purposes; ask your health care provider or pharmacist if you have questions. COMMON BRAND NAME(S): Velcade What should I tell my health care provider before I take this medicine? They need to know if you have any of these conditions: -diabetes -heart disease -irregular heartbeat -liver disease -on hemodialysis -low blood counts, like low white blood cells, platelets, or hemoglobin -peripheral neuropathy -taking medicine for blood pressure -an unusual or allergic reaction to bortezomib, mannitol, boron, other  medicines, foods, dyes, or preservatives -pregnant or trying to get pregnant -breast-feeding How should I use this medicine? This medicine is for injection into a vein or for injection under the skin. It is given by a health care professional in a hospital or clinic setting. Talk to your pediatrician regarding the use of this medicine in children. Special care may be needed. Overdosage: If you think you have taken too much of this medicine contact a poison control center or emergency room at once. NOTE: This medicine is only for you. Do not share this medicine with others. What if I miss a dose? It is important not to miss your dose. Call your doctor or health care professional if you are unable to keep an appointment. What may interact with this medicine? This medicine may interact with the following medications: -ketoconazole -rifampin -ritonavir -St. John's Wort This list may not describe all possible interactions. Give your health care provider a list of all the medicines, herbs, non-prescription drugs, or dietary supplements you use. Also tell them if you smoke, drink alcohol, or use illegal drugs. Some items may interact with your medicine. What should I watch for while using this medicine? You may get drowsy or dizzy. Do not drive, use machinery, or do anything that needs mental alertness until you know how this medicine affects you. Do not stand or sit up quickly, especially if you are an older patient. This reduces the risk of dizzy or fainting spells. In some cases, you may be given additional medicines to help with side effects. Follow all directions for their use. Call your doctor or health care professional for advice if you get a fever, chills or sore  throat, or other symptoms of a cold or flu. Do not treat yourself. This drug decreases your body's ability to fight infections. Try to avoid being around people who are sick. This medicine may increase your risk to bruise or bleed.  Call your doctor or health care professional if you notice any unusual bleeding. You may need blood work done while you are taking this medicine. In some patients, this medicine may cause a serious brain infection that may cause death. If you have any problems seeing, thinking, speaking, walking, or standing, tell your doctor right away. If you cannot reach your doctor, urgently seek other source of medical care. Check with your doctor or health care professional if you get an attack of severe diarrhea, nausea and vomiting, or if you sweat a lot. The loss of too much body fluid can make it dangerous for you to take this medicine. Do not become pregnant while taking this medicine or for at least 2 months after stopping it. Women should inform their doctor if they wish to become pregnant or think they might be pregnant. Men should not father a child while taking this medicine and for at least 2 months after stopping it. There is a potential for serious side effects to an unborn child. Talk to your health care professional or pharmacist for more information. Do not breast-feed an infant while taking this medicine or for 2 months after stopping it. This medicine may interfere with the ability to have a child. You should talk with your doctor or health care professional if you are concerned about your fertility. What side effects may I notice from receiving this medicine? Side effects that you should report to your doctor or health care professional as soon as possible: -allergic reactions like skin rash, itching or hives, swelling of the face, lips, or tongue -breathing problems -changes in hearing -changes in vision -fast, irregular heartbeat -feeling faint or lightheaded, falls -pain, tingling, numbness in the hands or feet -right upper belly pain -seizures -swelling of the ankles, feet, hands -unusual bleeding or bruising -unusually weak or tired -vomiting -yellowing of the eyes or skin Side  effects that usually do not require medical attention (report to your doctor or health care professional if they continue or are bothersome): -changes in emotions or moods -constipation -diarrhea -loss of appetite -headache -irritation at site where injected -nausea This list may not describe all possible side effects. Call your doctor for medical advice about side effects. You may report side effects to FDA at 1-800-FDA-1088. Where should I keep my medicine? This drug is given in a hospital or clinic and will not be stored at home. NOTE: This sheet is a summary. It may not cover all possible information. If you have questions about this medicine, talk to your doctor, pharmacist, or health care provider.  2018 Elsevier/Gold Standard (2015-12-20 15:53:51)

## 2016-10-24 NOTE — Progress Notes (Signed)
Patient observed for 30 minutes post velcade injections. Patient does not have any complications and is discharged accordingly.

## 2016-10-27 DIAGNOSIS — D649 Anemia, unspecified: Secondary | ICD-10-CM | POA: Diagnosis not present

## 2016-10-27 DIAGNOSIS — M6281 Muscle weakness (generalized): Secondary | ICD-10-CM | POA: Diagnosis not present

## 2016-10-27 DIAGNOSIS — R339 Retention of urine, unspecified: Secondary | ICD-10-CM | POA: Diagnosis not present

## 2016-10-27 DIAGNOSIS — Z4789 Encounter for other orthopedic aftercare: Secondary | ICD-10-CM | POA: Diagnosis not present

## 2016-10-27 DIAGNOSIS — G9529 Other cord compression: Secondary | ICD-10-CM | POA: Diagnosis not present

## 2016-10-27 DIAGNOSIS — G959 Disease of spinal cord, unspecified: Secondary | ICD-10-CM | POA: Diagnosis not present

## 2016-10-28 ENCOUNTER — Encounter (HOSPITAL_COMMUNITY): Payer: Self-pay

## 2016-10-29 NOTE — Progress Notes (Signed)
  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       ICD-10-CM   1. Multiple myeloma not having achieved remission (HCC) C90.00   2. Metastasis to bone [facility]) C79.51     Indication for treatment:  palliative       Radiation treatment dates:   09/29/2016 to 10/14/2016  Site/dose:    1. The spine T3-7 was treated to 25 Gy in 10 fractions at 2.5 Gy per fraction. 2. The Right hip was treated to 30 Gy in 10 fractions at 3 Gy per fraction.   Beams/energy:    1. photon // 15X 2. photon // 15X  Narrative: The patient tolerated radiation treatment relatively well. Denies any shortness of breath, dysphagia, bladder issues, or fatigue. States he has very little pain.   Plan: The patient has completed radiation treatment. The patient will return to radiation oncology clinic for routine followup in one month. I advised them to call or return sooner if they have any questions or concerns related to their recovery or treatment.  ------------------------------------------------  Jodelle Gross, MD, PhD  This document serves as a record of services personally performed by Kyung Rudd, MD. It was created on his behalf by Arlyce Harman, a trained medical scribe. The creation of this record is based on the scribe's personal observations and the provider's statements to them. This document has been checked and approved by the attending provider.

## 2016-10-31 ENCOUNTER — Ambulatory Visit (HOSPITAL_BASED_OUTPATIENT_CLINIC_OR_DEPARTMENT_OTHER): Payer: Medicare Other

## 2016-10-31 ENCOUNTER — Other Ambulatory Visit: Payer: Self-pay | Admitting: Oncology

## 2016-10-31 ENCOUNTER — Other Ambulatory Visit (HOSPITAL_BASED_OUTPATIENT_CLINIC_OR_DEPARTMENT_OTHER): Payer: Medicare Other

## 2016-10-31 VITALS — BP 133/87 | HR 86 | Temp 98.3°F | Resp 18

## 2016-10-31 DIAGNOSIS — C9000 Multiple myeloma not having achieved remission: Secondary | ICD-10-CM

## 2016-10-31 DIAGNOSIS — Z5112 Encounter for antineoplastic immunotherapy: Secondary | ICD-10-CM | POA: Diagnosis present

## 2016-10-31 LAB — COMPREHENSIVE METABOLIC PANEL
ALT: 14 U/L (ref 0–55)
AST: 19 U/L (ref 5–34)
Albumin: 3.8 g/dL (ref 3.5–5.0)
Alkaline Phosphatase: 39 U/L — ABNORMAL LOW (ref 40–150)
Anion Gap: 6 mEq/L (ref 3–11)
BUN: 11 mg/dL (ref 7.0–26.0)
CO2: 27 mEq/L (ref 22–29)
Calcium: 9.5 mg/dL (ref 8.4–10.4)
Chloride: 107 mEq/L (ref 98–109)
Creatinine: 0.9 mg/dL (ref 0.7–1.3)
EGFR: >90 mL/min/{1.73_m2} (ref >90)
Glucose: 92 mg/dl (ref 70–140)
Potassium: 4 mEq/L (ref 3.5–5.1)
Sodium: 140 mEq/L (ref 136–145)
Total Bilirubin: 0.75 mg/dL (ref 0.20–1.20)
Total Protein: 6.5 g/dL (ref 6.4–8.3)

## 2016-10-31 LAB — CBC WITH DIFFERENTIAL/PLATELET
BASO%: 0.4 % (ref 0.0–2.0)
Basophils Absolute: 0 10*3/uL (ref 0.0–0.1)
EOS%: 2.3 % (ref 0.0–7.0)
Eosinophils Absolute: 0.1 10*3/uL (ref 0.0–0.5)
HCT: 30.1 % — ABNORMAL LOW (ref 38.4–49.9)
HGB: 10.4 g/dL — ABNORMAL LOW (ref 13.0–17.1)
LYMPH%: 9 % — ABNORMAL LOW (ref 14.0–49.0)
MCH: 32.1 pg (ref 27.2–33.4)
MCHC: 34.5 g/dL (ref 32.0–36.0)
MCV: 93.2 fL (ref 79.3–98.0)
MONO#: 0.5 10*3/uL (ref 0.1–0.9)
MONO%: 7.7 % (ref 0.0–14.0)
NEUT#: 4.8 10*3/uL (ref 1.5–6.5)
NEUT%: 80.6 % — ABNORMAL HIGH (ref 39.0–75.0)
Platelets: 217 10*3/uL (ref 140–400)
RBC: 3.22 10*6/uL — ABNORMAL LOW (ref 4.20–5.82)
RDW: 15.6 % — ABNORMAL HIGH (ref 11.0–14.6)
WBC: 5.9 10*3/uL (ref 4.0–10.3)
lymph#: 0.5 10*3/uL — ABNORMAL LOW (ref 0.9–3.3)

## 2016-10-31 LAB — TISSUE HYBRIDIZATION (BONE MARROW)-NCBH

## 2016-10-31 LAB — CHROMOSOME ANALYSIS, BONE MARROW

## 2016-10-31 MED ORDER — BORTEZOMIB CHEMO SQ INJECTION 3.5 MG (2.5MG/ML)
1.5000 mg/m2 | Freq: Once | INTRAMUSCULAR | Status: AC
  Administered 2016-10-31: 3.25 mg via SUBCUTANEOUS
  Filled 2016-10-31: qty 1.3

## 2016-10-31 MED ORDER — PROCHLORPERAZINE MALEATE 10 MG PO TABS
10.0000 mg | ORAL_TABLET | Freq: Once | ORAL | Status: AC
  Administered 2016-10-31: 10 mg via ORAL

## 2016-10-31 MED ORDER — PROCHLORPERAZINE MALEATE 10 MG PO TABS
ORAL_TABLET | ORAL | Status: AC
  Filled 2016-10-31: qty 1

## 2016-10-31 NOTE — Patient Instructions (Signed)
[facility] Discharge Instructions for Patients Receiving Chemotherapy  Today you received the following chemotherapy agents Velcade.  To help prevent nausea and vomiting after your treatment, we encourage you to take your nausea medication as directed.  If you develop nausea and vomiting that is not controlled by your nausea medication, call the clinic.   BELOW ARE SYMPTOMS THAT SHOULD BE REPORTED IMMEDIATELY:  *FEVER GREATER THAN 100.5 F  *CHILLS WITH OR WITHOUT FEVER  NAUSEA AND VOMITING THAT IS NOT CONTROLLED WITH YOUR NAUSEA MEDICATION  *UNUSUAL SHORTNESS OF BREATH  *UNUSUAL BRUISING OR BLEEDING  TENDERNESS IN MOUTH AND THROAT WITH OR WITHOUT PRESENCE OF ULCERS  *URINARY PROBLEMS  *BOWEL PROBLEMS  UNUSUAL RASH Items with * indicate a potential emergency and should be followed up as soon as possible.  Feel free to call the clinic should you have any questions or concerns. The clinic phone number is [phone].  Please show the CHEMO ALERT CARD at check-in to the Emergency Department and triage nurse.   

## 2016-11-07 ENCOUNTER — Ambulatory Visit (HOSPITAL_BASED_OUTPATIENT_CLINIC_OR_DEPARTMENT_OTHER): Payer: Medicare Other | Admitting: Oncology

## 2016-11-07 ENCOUNTER — Telehealth: Payer: Self-pay | Admitting: Oncology

## 2016-11-07 ENCOUNTER — Ambulatory Visit (HOSPITAL_BASED_OUTPATIENT_CLINIC_OR_DEPARTMENT_OTHER): Payer: Medicare Other

## 2016-11-07 ENCOUNTER — Other Ambulatory Visit (HOSPITAL_BASED_OUTPATIENT_CLINIC_OR_DEPARTMENT_OTHER): Payer: Medicare Other

## 2016-11-07 ENCOUNTER — Encounter: Payer: Medicare Other | Admitting: Nutrition

## 2016-11-07 VITALS — BP 132/69 | HR 85 | Temp 98.0°F | Resp 18 | Ht 72.25 in | Wt 191.9 lb

## 2016-11-07 DIAGNOSIS — D649 Anemia, unspecified: Secondary | ICD-10-CM

## 2016-11-07 DIAGNOSIS — C9000 Multiple myeloma not having achieved remission: Secondary | ICD-10-CM

## 2016-11-07 DIAGNOSIS — Z5112 Encounter for antineoplastic immunotherapy: Secondary | ICD-10-CM | POA: Diagnosis present

## 2016-11-07 LAB — COMPREHENSIVE METABOLIC PANEL
ALT: 30 U/L (ref 0–55)
AST: 39 U/L — ABNORMAL HIGH (ref 5–34)
Albumin: 3.7 g/dL (ref 3.5–5.0)
Alkaline Phosphatase: 33 U/L — ABNORMAL LOW (ref 40–150)
Anion Gap: 6 mEq/L (ref 3–11)
BUN: 13 mg/dL (ref 7.0–26.0)
CO2: 25 mEq/L (ref 22–29)
Calcium: 9.8 mg/dL (ref 8.4–10.4)
Chloride: 108 mEq/L (ref 98–109)
Creatinine: 0.8 mg/dL (ref 0.7–1.3)
EGFR: >90 mL/min/{1.73_m2} (ref >90)
Glucose: 97 mg/dl (ref 70–140)
Potassium: 4.1 mEq/L (ref 3.5–5.1)
Sodium: 139 mEq/L (ref 136–145)
Total Bilirubin: 0.29 mg/dL (ref 0.20–1.20)
Total Protein: 6.2 g/dL — ABNORMAL LOW (ref 6.4–8.3)

## 2016-11-07 LAB — CBC WITH DIFFERENTIAL/PLATELET
BASO%: 0.4 % (ref 0.0–2.0)
Basophils Absolute: 0 10*3/uL (ref 0.0–0.1)
EOS%: 2.9 % (ref 0.0–7.0)
Eosinophils Absolute: 0.2 10*3/uL (ref 0.0–0.5)
HCT: 29.9 % — ABNORMAL LOW (ref 38.4–49.9)
HGB: 10.3 g/dL — ABNORMAL LOW (ref 13.0–17.1)
LYMPH%: 5.9 % — ABNORMAL LOW (ref 14.0–49.0)
MCH: 32.9 pg (ref 27.2–33.4)
MCHC: 34.6 g/dL (ref 32.0–36.0)
MCV: 95.2 fL (ref 79.3–98.0)
MONO#: 0.5 10*3/uL (ref 0.1–0.9)
MONO%: 6.7 % (ref 0.0–14.0)
NEUT#: 6.4 10*3/uL (ref 1.5–6.5)
NEUT%: 84.1 % — ABNORMAL HIGH (ref 39.0–75.0)
Platelets: 406 10*3/uL — ABNORMAL HIGH (ref 140–400)
RBC: 3.14 10*6/uL — ABNORMAL LOW (ref 4.20–5.82)
RDW: 16.7 % — ABNORMAL HIGH (ref 11.0–14.6)
WBC: 7.6 10*3/uL (ref 4.0–10.3)
lymph#: 0.4 10*3/uL — ABNORMAL LOW (ref 0.9–3.3)

## 2016-11-07 MED ORDER — BORTEZOMIB CHEMO SQ INJECTION 3.5 MG (2.5MG/ML)
1.5000 mg/m2 | Freq: Once | INTRAMUSCULAR | Status: AC
  Administered 2016-11-07: 3.25 mg via SUBCUTANEOUS
  Filled 2016-11-07: qty 3.25

## 2016-11-07 MED ORDER — PROCHLORPERAZINE MALEATE 10 MG PO TABS
10.0000 mg | ORAL_TABLET | Freq: Once | ORAL | Status: AC
  Administered 2016-11-07: 10 mg via ORAL

## 2016-11-07 MED ORDER — PROCHLORPERAZINE MALEATE 10 MG PO TABS
ORAL_TABLET | ORAL | Status: AC
  Filled 2016-11-07: qty 1

## 2016-11-07 NOTE — Telephone Encounter (Signed)
Scheduled appt per 10/5 los - Gave patient AVS and calender per los.  

## 2016-11-07 NOTE — Progress Notes (Signed)
Hematology and Oncology Follow Up Visit  Alexander Saunders [id number] [birth year] 66 y.o. 11/07/2016 9:51 AM Alexander Saunders MDOsei-Bonsu, [name], MD   Principle Diagnosis: 66 year old gentleman diagnosed with multiple myeloma in July 2018. 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 His workup revealed diffuse bony involvement, 10% plasma myeloma in the bone marrow. He had a 0.1 g/dL M spike but no heavy chain immunofixation noted. His kappa light chain was elevated with an increased ratio of 4.25. His cytogenetics are currently pending.   Prior Therapy:  He is status post surgical decompression of an epidural mass on 09/04/2016 and the pathology showed a plasma cell neoplasm.  Bone marrow biopsy obtained on 10/02/2016 showed 10% plasma cell involvement.  He is S/P adjuvant radiation therapy to the thoracic spine to be completed on 10/14/2016.  Current therapy:   He is currently receiving Velcade 1.5 mg/m weekly with dexamethasone 20 mg and Cytoxan 600 mg po. Therapy started on 10/24/2016.   Interim History:  Alexander Saunders presents today for a follow-up visit. This last visit, he started systemic therapy utilizing Velcade, Cytoxan and dexamethasone without complications. He received 2 weeks and reported mild fatigue the day after that has resolved after that. He denied any nausea, vomiting, neuropathy or complications with oral Cytoxan.   His appetite is improving and has gained weight. His mobility is also has improved and ambulating without much help. He has been exercising regularly and resumed classes. He denied any increased back pain, pathological fractures or weakness.  He does not report any headaches, blurry vision, syncope or seizures. He does not report any fevers, chills, sweats or weight loss. He does not report any chest pain, palpitation, orthopnea or leg edema. He does not report any cough, wheezing or hemoptysis. He does  not report any nausea, vomiting, abdominal pain, constipation, diarrhea or hematochezia. He does not report any frequency, urgency or loss of bladder control. He does not report any lymphadenopathy or petechiae. He does not report any pathological fractures. Remaining review of systems unremarkable  Medications: I have reviewed the patient's current medications.  Current Outpatient Prescriptions  Medication Sig Dispense Refill  . acyclovir (ZOVIRAX) 400 MG tablet Take 1 tablet (400 mg total) by mouth 2 (two) times daily. 60 tablet 3  . bisacodyl (DULCOLAX) 10 MG suppository Place 1 suppository (10 mg total) rectally daily as needed for moderate constipation. 12 suppository 0  . cyclophosphamide (CYTOXAN) 50 MG capsule Take 12 caps ('600mg'$ ) by mouth once weekly on day of chemotherapy.Give early in day, 1 hour before or 2 hours after meals. Maintain hydration 48 capsule 3  . dexamethasone (DECADRON) 4 MG tablet Take 5 tablets ('20mg'$ ) by mouth once weekly with breakfast on days of chemotherapy. 60 tablet 3  . diazepam (VALIUM) 5 MG tablet Take 1 tablet (5 mg total) by mouth every 6 (six) hours as needed for muscle spasms. 30 tablet 0  . diazepam (VALIUM) 5 MG tablet Take 1 tablet (5 mg total) by mouth every 8 (eight) hours as needed for muscle spasms. Take 0.5-1 tablet (2.5-'5mg'$ ) PO q8h PRN muscle spasms 10 tablet 0  . diphenhydrAMINE (BENADRYL) 25 MG tablet Take 25 mg by mouth every 6 (six) hours as needed for itching or allergies.     [name] gabapentin (NEURONTIN) 300 MG capsule Take 1 capsule (300 mg total) by mouth 3 (three) times daily. 90 capsule 3  . gentamicin (GARAMYCIN) 0.3 % ophthalmic solution Place 2 drops into both  eyes 4 (four) times daily. 5 mL 0  . loratadine (CLARITIN) 10 MG tablet Take 10 mg by mouth daily as needed for allergies.    [name] menthol-cetylpyridinium (CEPACOL) 3 MG lozenge Take 1 lozenge (3 mg total) by mouth as needed for sore throat (sore throat). 100 tablet 12  .  oxyCODONE-acetaminophen (PERCOCET) 5-325 MG tablet Take 1-2 tablets by mouth every 6 (six) hours as needed for severe pain. 15 tablet 0  . oxyCODONE-acetaminophen (PERCOCET) 7.5-325 MG tablet Take 1 tablet by mouth every 4 (four) hours as needed for severe pain. 60 tablet 0  . oxyCODONE-acetaminophen (PERCOCET/ROXICET) 5-325 MG tablet Take 1 tablet by mouth every 6 (six) hours as needed for pain.    . pantoprazole (PROTONIX) 40 MG tablet Take 1 tablet (40 mg total) by mouth daily. 30 tablet 0  . polyethylene glycol (MIRALAX / GLYCOLAX) packet Take 17 g by mouth 2 (two) times daily. 14 each 0  . predniSONE (DELTASONE) 20 MG tablet 3 tabs po daily x 3 days starting 10/09/16 9 tablet 0  . prochlorperazine (COMPAZINE) 10 MG tablet Take 1 tablet (10 mg total) by mouth every 6 (six) hours as needed for nausea or vomiting. 30 tablet 0  . senna-docusate (SENOKOT-S) 8.6-50 MG tablet Take 1 tablet by mouth 2 (two) times daily. 60 tablet 0  . tamsulosin (FLOMAX) 0.4 MG CAPS capsule Take 1 capsule (0.4 mg total) by mouth daily. 30 capsule 0  . UNKNOWN TO PATIENT Place 2 drops into the right eye daily. Non-steroidal for inflammation.     No current facility-administered medications for this visit.      Allergies: No Known Allergies  Past Medical History, Surgical history, Social history, and Family History were reviewed and updated.  Physical Exam: Blood pressure 132/69, pulse 85, temperature 98 F (36.7 C), temperature source Oral, resp. rate 18, height 6' 0.25" (1.835 m), weight 191 lb 13.9 oz (87 kg), SpO2 100 %. ECOG: 1 General appearance: alert and cooperative appeared without distress. Head: Normocephalic, without obvious abnormality no oral thrush or ulcers. Neck: no adenopathy Lymph nodes: Cervical, supraclavicular, and axillary nodes normal. Heart:regular rate and rhythm, S1, S2 normal, no murmur, click, rub or gallop Lung:chest clear, no wheezing, rales, normal symmetric air entry Abdomin:  soft, non-tender, without masses or organomegaly no shifting dullness or ascites. EXT:no erythema, induration, or nodules   Lab Results: Lab Results  Component Value Date   WBC 7.6 11/07/2016   HGB 10.3 (L) 11/07/2016   HCT 29.9 (L) 11/07/2016   MCV 95.2 11/07/2016   PLT 406 (H) 11/07/2016     Chemistry      Component Value Date/Time   NA 140 10/31/2016 0846   K 4.0 10/31/2016 0846   CL 104 10/08/2016 1304   CO2 27 10/31/2016 0846   BUN 11.0 10/31/2016 0846   CREATININE 0.9 10/31/2016 0846      Component Value Date/Time   CALCIUM 9.5 10/31/2016 0846   ALKPHOS 39 (L) 10/31/2016 0846   AST 19 10/31/2016 0846   ALT 14 10/31/2016 0846   BILITOT 0.75 10/31/2016 0846        Impression and Plan:  66 year old gentleman with the following issues:  1. Plasma cell neoplasm diagnosed and July 2018. He presented with lower extremity weakness and found to have a epidural mass around T4-T6 that required urgent surgical decompression on 09/04/2016. The follow-up pathology confirmed the presence of a plasma cell neoplasm. CT scan of the chest abdomen and pelvis showed no solid  tumor malignancy but diffuse bone involvement including a 2.8 cm in the posterior aspect of the right acetabulum.  Bone marrow biopsy showed 10% plasma cell neoplasm and increased Kappa Light chain in the blood.  He is currently receiving Velcade, dexamethasone and Cytoxan weekly. He tolerated the first 2 weeks without complications and the plan is to continue the same dose and schedule. We will continue to assess his response on a monthly basis and plan to repeat imaging studies in 3 months.  2. Epidural tumor compression: He status post surgical decompression accomplished on 09/04/2016. He is completed radiation therapy adjuvantly on 10/14/2016.  3. Bone health: He received the first of Zometa on 09/26/2016 and will be repeated as needed.  4. Bone pain: Little pain noted at this time.  5. Renal function:  His kidney function remains within normal range.  6. Anemia: Very mild at this time and likely related to plasma cell disorder. He does not require any transfusion or growth factor support.  7. Prognosis: This was discussed today. He remains an excellent performance status and this is a treatable malignancy and achieving long-term remission would be the goal.  8. Follow-up: Will be weekly for treatment and in 3 weeks to follow his progress.      Zola Button, MD 10/5/20189:51 AM

## 2016-11-07 NOTE — Patient Instructions (Signed)
[facility] Discharge Instructions for Patients Receiving Chemotherapy  Today you received the following chemotherapy agents Velcade.  To help prevent nausea and vomiting after your treatment, we encourage you to take your nausea medication as directed.  If you develop nausea and vomiting that is not controlled by your nausea medication, call the clinic.   BELOW ARE SYMPTOMS THAT SHOULD BE REPORTED IMMEDIATELY:  *FEVER GREATER THAN 100.5 F  *CHILLS WITH OR WITHOUT FEVER  NAUSEA AND VOMITING THAT IS NOT CONTROLLED WITH YOUR NAUSEA MEDICATION  *UNUSUAL SHORTNESS OF BREATH  *UNUSUAL BRUISING OR BLEEDING  TENDERNESS IN MOUTH AND THROAT WITH OR WITHOUT PRESENCE OF ULCERS  *URINARY PROBLEMS  *BOWEL PROBLEMS  UNUSUAL RASH Items with * indicate a potential emergency and should be followed up as soon as possible.  Feel free to call the clinic should you have any questions or concerns. The clinic phone number is [phone].  Please show the CHEMO ALERT CARD at check-in to the Emergency Department and triage nurse.   

## 2016-11-11 ENCOUNTER — Telehealth: Payer: Self-pay | Admitting: *Deleted

## 2016-11-11 NOTE — Telephone Encounter (Signed)
Per Dr. Alen Blew, I instructed patient to take Mylanta (liquid form) to help with the bloating and gas after meals. Patient verbalized understanding.

## 2016-11-11 NOTE — Telephone Encounter (Signed)
Patient called and left a voice mail message stating, "I have increased bloating, especially after eating a meal. Is this coming from the Velcade/Cytoxan (po)/Decadron? What can I take to help with this?" Instructed patient that I would ask Dr. Alen Blew and call him back. He verbalized understanding.

## 2016-11-13 ENCOUNTER — Other Ambulatory Visit: Payer: Medicare Other

## 2016-11-14 ENCOUNTER — Other Ambulatory Visit (HOSPITAL_BASED_OUTPATIENT_CLINIC_OR_DEPARTMENT_OTHER): Payer: Medicare Other

## 2016-11-14 ENCOUNTER — Ambulatory Visit (HOSPITAL_BASED_OUTPATIENT_CLINIC_OR_DEPARTMENT_OTHER): Payer: Medicare Other

## 2016-11-14 VITALS — BP 112/63 | HR 70 | Temp 98.5°F | Resp 18

## 2016-11-14 DIAGNOSIS — Z5112 Encounter for antineoplastic immunotherapy: Secondary | ICD-10-CM

## 2016-11-14 DIAGNOSIS — C9000 Multiple myeloma not having achieved remission: Secondary | ICD-10-CM

## 2016-11-14 LAB — CBC WITH DIFFERENTIAL/PLATELET
BASO%: 1 % (ref 0.0–2.0)
Basophils Absolute: 0 10*3/uL (ref 0.0–0.1)
EOS%: 2.3 % (ref 0.0–7.0)
Eosinophils Absolute: 0.1 10*3/uL (ref 0.0–0.5)
HCT: 31.9 % — ABNORMAL LOW (ref 38.4–49.9)
HGB: 10.7 g/dL — ABNORMAL LOW (ref 13.0–17.1)
LYMPH%: 12.7 % — ABNORMAL LOW (ref 14.0–49.0)
MCH: 32.1 pg (ref 27.2–33.4)
MCHC: 33.4 g/dL (ref 32.0–36.0)
MCV: 96.1 fL (ref 79.3–98.0)
MONO#: 0.7 10*3/uL (ref 0.1–0.9)
MONO%: 14.6 % — ABNORMAL HIGH (ref 0.0–14.0)
NEUT#: 3.5 10*3/uL (ref 1.5–6.5)
NEUT%: 69.4 % (ref 39.0–75.0)
Platelets: 379 10*3/uL (ref 140–400)
RBC: 3.32 10*6/uL — ABNORMAL LOW (ref 4.20–5.82)
RDW: 16.5 % — ABNORMAL HIGH (ref 11.0–14.6)
WBC: 5 10*3/uL (ref 4.0–10.3)
lymph#: 0.6 10*3/uL — ABNORMAL LOW (ref 0.9–3.3)

## 2016-11-14 LAB — COMPREHENSIVE METABOLIC PANEL
ALT: 18 U/L (ref 0–55)
AST: 16 U/L (ref 5–34)
Albumin: 3.6 g/dL (ref 3.5–5.0)
Alkaline Phosphatase: 31 U/L — ABNORMAL LOW (ref 40–150)
Anion Gap: 9 mEq/L (ref 3–11)
BUN: 15.6 mg/dL (ref 7.0–26.0)
CO2: 24 mEq/L (ref 22–29)
Calcium: 9.3 mg/dL (ref 8.4–10.4)
Chloride: 106 mEq/L (ref 98–109)
Creatinine: 0.9 mg/dL (ref 0.7–1.3)
EGFR: >60 mL/min/{1.73_m2} (ref >60)
Glucose: 104 mg/dl (ref 70–140)
Potassium: 3.8 mEq/L (ref 3.5–5.1)
Sodium: 139 mEq/L (ref 136–145)
Total Bilirubin: 0.31 mg/dL (ref 0.20–1.20)
Total Protein: 6.1 g/dL — ABNORMAL LOW (ref 6.4–8.3)

## 2016-11-14 MED ORDER — BORTEZOMIB CHEMO SQ INJECTION 3.5 MG (2.5MG/ML)
1.5000 mg/m2 | Freq: Once | INTRAMUSCULAR | Status: AC
  Administered 2016-11-14: 3.25 mg via SUBCUTANEOUS
  Filled 2016-11-14: qty 3.25

## 2016-11-14 MED ORDER — PROCHLORPERAZINE MALEATE 10 MG PO TABS
10.0000 mg | ORAL_TABLET | Freq: Once | ORAL | Status: AC
  Administered 2016-11-14: 10 mg via ORAL

## 2016-11-14 MED ORDER — PROCHLORPERAZINE MALEATE 10 MG PO TABS
ORAL_TABLET | ORAL | Status: AC
Start: 2016-11-14 — End: ?
  Filled 2016-11-14: qty 1

## 2016-11-14 MED FILL — CYCLOPHOSPHAMIDE 50 MG CAPS: 50 | 28 days supply | Qty: 48 | Fill #1

## 2016-11-14 NOTE — Patient Instructions (Signed)
[facility] Discharge Instructions for Patients Receiving Chemotherapy  Today you received the following chemotherapy agents Velcade.  To help prevent nausea and vomiting after your treatment, we encourage you to take your nausea medication as directed.  If you develop nausea and vomiting that is not controlled by your nausea medication, call the clinic.   BELOW ARE SYMPTOMS THAT SHOULD BE REPORTED IMMEDIATELY:  *FEVER GREATER THAN 100.5 F  *CHILLS WITH OR WITHOUT FEVER  NAUSEA AND VOMITING THAT IS NOT CONTROLLED WITH YOUR NAUSEA MEDICATION  *UNUSUAL SHORTNESS OF BREATH  *UNUSUAL BRUISING OR BLEEDING  TENDERNESS IN MOUTH AND THROAT WITH OR WITHOUT PRESENCE OF ULCERS  *URINARY PROBLEMS  *BOWEL PROBLEMS  UNUSUAL RASH Items with * indicate a potential emergency and should be followed up as soon as possible.  Feel free to call the clinic should you have any questions or concerns. The clinic phone number is [phone].  Please show the CHEMO ALERT CARD at check-in to the Emergency Department and triage nurse.   

## 2016-11-21 ENCOUNTER — Ambulatory Visit (HOSPITAL_BASED_OUTPATIENT_CLINIC_OR_DEPARTMENT_OTHER): Payer: Medicare Other

## 2016-11-21 ENCOUNTER — Other Ambulatory Visit (HOSPITAL_BASED_OUTPATIENT_CLINIC_OR_DEPARTMENT_OTHER): Payer: Medicare Other

## 2016-11-21 VITALS — BP 128/74 | HR 71 | Temp 97.9°F | Resp 18

## 2016-11-21 DIAGNOSIS — Z5112 Encounter for antineoplastic immunotherapy: Secondary | ICD-10-CM

## 2016-11-21 DIAGNOSIS — C9000 Multiple myeloma not having achieved remission: Secondary | ICD-10-CM | POA: Diagnosis not present

## 2016-11-21 LAB — COMPREHENSIVE METABOLIC PANEL
ALT: 50 U/L (ref 0–55)
AST: 38 U/L — ABNORMAL HIGH (ref 5–34)
Albumin: 3.9 g/dL (ref 3.5–5.0)
Alkaline Phosphatase: 33 U/L — ABNORMAL LOW (ref 40–150)
Anion Gap: 7 mEq/L (ref 3–11)
BUN: 12.9 mg/dL (ref 7.0–26.0)
CO2: 27 mEq/L (ref 22–29)
Calcium: 9.7 mg/dL (ref 8.4–10.4)
Chloride: 106 mEq/L (ref 98–109)
Creatinine: 0.9 mg/dL (ref 0.7–1.3)
EGFR: >60 mL/min/{1.73_m2} (ref >60)
Glucose: 79 mg/dl (ref 70–140)
Potassium: 4 mEq/L (ref 3.5–5.1)
Sodium: 140 mEq/L (ref 136–145)
Total Bilirubin: 0.3 mg/dL (ref 0.20–1.20)
Total Protein: 6.6 g/dL (ref 6.4–8.3)

## 2016-11-21 LAB — CBC WITH DIFFERENTIAL/PLATELET
BASO%: 0.8 % (ref 0.0–2.0)
Basophils Absolute: 0 10*3/uL (ref 0.0–0.1)
EOS%: 2.3 % (ref 0.0–7.0)
Eosinophils Absolute: 0.1 10*3/uL (ref 0.0–0.5)
HCT: 35.1 % — ABNORMAL LOW (ref 38.4–49.9)
HGB: 11.7 g/dL — ABNORMAL LOW (ref 13.0–17.1)
LYMPH%: 10.7 % — ABNORMAL LOW (ref 14.0–49.0)
MCH: 32.5 pg (ref 27.2–33.4)
MCHC: 33.4 g/dL (ref 32.0–36.0)
MCV: 97.2 fL (ref 79.3–98.0)
MONO#: 0.5 10*3/uL (ref 0.1–0.9)
MONO%: 10.9 % (ref 0.0–14.0)
NEUT#: 3.2 10*3/uL (ref 1.5–6.5)
NEUT%: 75.3 % — ABNORMAL HIGH (ref 39.0–75.0)
Platelets: 305 10*3/uL (ref 140–400)
RBC: 3.61 10*6/uL — ABNORMAL LOW (ref 4.20–5.82)
RDW: 17.3 % — ABNORMAL HIGH (ref 11.0–14.6)
WBC: 4.3 10*3/uL (ref 4.0–10.3)
lymph#: 0.5 10*3/uL — ABNORMAL LOW (ref 0.9–3.3)

## 2016-11-21 MED ORDER — PROCHLORPERAZINE MALEATE 10 MG PO TABS
10.0000 mg | ORAL_TABLET | Freq: Once | ORAL | Status: AC
  Administered 2016-11-21: 10 mg via ORAL

## 2016-11-21 MED ORDER — BORTEZOMIB CHEMO SQ INJECTION 3.5 MG (2.5MG/ML)
1.5000 mg/m2 | Freq: Once | INTRAMUSCULAR | Status: AC
  Administered 2016-11-21: 3.25 mg via SUBCUTANEOUS
  Filled 2016-11-21: qty 3.25

## 2016-11-21 MED ORDER — ZOLEDRONIC ACID 4 MG/100ML IV SOLN
4.0000 mg | Freq: Once | INTRAVENOUS | Status: AC
  Administered 2016-11-21: 4 mg via INTRAVENOUS
  Filled 2016-11-21: qty 100

## 2016-11-21 MED ORDER — SODIUM CHLORIDE 0.9 % IV SOLN
Freq: Once | INTRAVENOUS | Status: AC
  Administered 2016-11-21: 11:00:00 via INTRAVENOUS

## 2016-11-21 MED ORDER — ONDANSETRON HCL 8 MG PO TABS
ORAL_TABLET | ORAL | Status: AC
  Filled 2016-11-21: qty 1

## 2016-11-21 MED ORDER — PROCHLORPERAZINE MALEATE 10 MG PO TABS
ORAL_TABLET | ORAL | Status: AC
Start: 2016-11-21 — End: 2016-11-21
  Filled 2016-11-21: qty 1

## 2016-11-21 MED FILL — ACYCLOVIR 400 MG TABLET: 400 | 30 days supply | Qty: 60 | Fill #1

## 2016-11-21 NOTE — Patient Instructions (Addendum)
[facility] Discharge Instructions for Patients Receiving Chemotherapy  Today you received the following chemotherapy agents: Velcade and Zometa  To help prevent nausea and vomiting after your treatment, we encourage you to take your nausea medication as directed.   If you develop nausea and vomiting that is not controlled by your nausea medication, call the clinic.   BELOW ARE SYMPTOMS THAT SHOULD BE REPORTED IMMEDIATELY:  *FEVER GREATER THAN 100.5 F  *CHILLS WITH OR WITHOUT FEVER  NAUSEA AND VOMITING THAT IS NOT CONTROLLED WITH YOUR NAUSEA MEDICATION  *UNUSUAL SHORTNESS OF BREATH  *UNUSUAL BRUISING OR BLEEDING  TENDERNESS IN MOUTH AND THROAT WITH OR WITHOUT PRESENCE OF ULCERS  *URINARY PROBLEMS  *BOWEL PROBLEMS  UNUSUAL RASH Items with * indicate a potential emergency and should be followed up as soon as possible.  Feel free to call the clinic should you have any questions or concerns. The clinic phone number is [phone].  Please show the CHEMO ALERT CARD at check-in to the Emergency Department and triage nurse.   

## 2016-11-24 LAB — KAPPA/LAMBDA LIGHT CHAINS
Ig Kappa Free Light Chain: 21.9 mg/L — ABNORMAL HIGH (ref 3.3–19.4)
Ig Lambda Free Light Chain: 8.6 mg/L (ref 5.7–26.3)
Kappa/Lambda FluidC Ratio: 2.55 — ABNORMAL HIGH (ref 0.26–1.65)

## 2016-11-25 ENCOUNTER — Telehealth: Payer: Self-pay | Admitting: *Deleted

## 2016-11-25 NOTE — Telephone Encounter (Signed)
Spoke with Alexander Saunders to inform him of Dr. Hazeline Junker response to request to fly.  Denies any further questions at this time.

## 2016-11-25 NOTE — Telephone Encounter (Signed)
I have no objections. 

## 2016-11-25 NOTE — Telephone Encounter (Signed)
"  Could Dr. Alen Blew let me know if I may fly while receiving chemotherapy.  Need to make reservations for my wife and I as soon as possible to fly in November.  Not sure what date but will leave on a Saturday and return the following Wednesday.  I will not miss any chemotherapy.  Return number [phone].  Okay to leave a message."

## 2016-11-26 LAB — MULTIPLE MYELOMA PANEL, SERUM
Albumin SerPl Elph-Mcnc: 3.5 g/dL (ref 2.9–4.4)
Albumin/Glob SerPl: 1.4 (ref 0.7–1.7)
Alpha 1: 0.2 g/dL (ref 0.0–0.4)
Alpha2 Glob SerPl Elph-Mcnc: 0.7 g/dL (ref 0.4–1.0)
B-Globulin SerPl Elph-Mcnc: 1 g/dL (ref 0.7–1.3)
Gamma Glob SerPl Elph-Mcnc: 0.6 g/dL (ref 0.4–1.8)
Globulin, Total: 2.6 g/dL (ref 2.2–3.9)
IgA, Qn, Serum: 78 mg/dL (ref 61–437)
IgG, Qn, Serum: 659 mg/dL — ABNORMAL LOW (ref 700–1600)
IgM, Qn, Serum: 17 mg/dL — ABNORMAL LOW (ref 20–172)
Total Protein: 6.1 g/dL (ref 6.0–8.5)

## 2016-11-28 ENCOUNTER — Telehealth: Payer: Self-pay

## 2016-11-28 ENCOUNTER — Ambulatory Visit (HOSPITAL_BASED_OUTPATIENT_CLINIC_OR_DEPARTMENT_OTHER): Payer: Medicare Other

## 2016-11-28 ENCOUNTER — Other Ambulatory Visit (HOSPITAL_BASED_OUTPATIENT_CLINIC_OR_DEPARTMENT_OTHER): Payer: Medicare Other

## 2016-11-28 ENCOUNTER — Ambulatory Visit (HOSPITAL_BASED_OUTPATIENT_CLINIC_OR_DEPARTMENT_OTHER): Payer: Medicare Other | Admitting: Oncology

## 2016-11-28 VITALS — BP 139/75 | HR 84 | Temp 97.9°F | Resp 18 | Ht 72.25 in | Wt 191.0 lb

## 2016-11-28 DIAGNOSIS — C9000 Multiple myeloma not having achieved remission: Secondary | ICD-10-CM | POA: Diagnosis not present

## 2016-11-28 DIAGNOSIS — Z5112 Encounter for antineoplastic immunotherapy: Secondary | ICD-10-CM | POA: Diagnosis present

## 2016-11-28 DIAGNOSIS — D649 Anemia, unspecified: Secondary | ICD-10-CM | POA: Diagnosis not present

## 2016-11-28 LAB — CBC WITH DIFFERENTIAL/PLATELET
BASO%: 0.1 % (ref 0.0–2.0)
Basophils Absolute: 0 10*3/uL (ref 0.0–0.1)
EOS%: 0 % (ref 0.0–7.0)
Eosinophils Absolute: 0 10*3/uL (ref 0.0–0.5)
HCT: 34.9 % — ABNORMAL LOW (ref 38.4–49.9)
HGB: 11.5 g/dL — ABNORMAL LOW (ref 13.0–17.1)
LYMPH%: 3.5 % — ABNORMAL LOW (ref 14.0–49.0)
MCH: 31.9 pg (ref 27.2–33.4)
MCHC: 33 g/dL (ref 32.0–36.0)
MCV: 96.9 fL (ref 79.3–98.0)
MONO#: 0.3 10*3/uL (ref 0.1–0.9)
MONO%: 3 % (ref 0.0–14.0)
NEUT#: 8 10*3/uL — ABNORMAL HIGH (ref 1.5–6.5)
NEUT%: 93.4 % — ABNORMAL HIGH (ref 39.0–75.0)
Platelets: 270 10*3/uL (ref 140–400)
RBC: 3.6 10*6/uL — ABNORMAL LOW (ref 4.20–5.82)
RDW: 15.7 % — ABNORMAL HIGH (ref 11.0–14.6)
WBC: 8.6 10*3/uL (ref 4.0–10.3)
lymph#: 0.3 10*3/uL — ABNORMAL LOW (ref 0.9–3.3)

## 2016-11-28 LAB — COMPREHENSIVE METABOLIC PANEL
ALT: 26 U/L (ref 0–55)
AST: 20 U/L (ref 5–34)
Albumin: 4.1 g/dL (ref 3.5–5.0)
Alkaline Phosphatase: 32 U/L — ABNORMAL LOW (ref 40–150)
Anion Gap: 8 mEq/L (ref 3–11)
BUN: 11.6 mg/dL (ref 7.0–26.0)
CO2: 26 mEq/L (ref 22–29)
Calcium: 10.3 mg/dL (ref 8.4–10.4)
Chloride: 102 mEq/L (ref 98–109)
Creatinine: 0.9 mg/dL (ref 0.7–1.3)
EGFR: >60 mL/min/{1.73_m2} (ref >60)
Glucose: 115 mg/dl (ref 70–140)
Potassium: 4.5 mEq/L (ref 3.5–5.1)
Sodium: 136 mEq/L (ref 136–145)
Total Bilirubin: 0.58 mg/dL (ref 0.20–1.20)
Total Protein: 6.9 g/dL (ref 6.4–8.3)

## 2016-11-28 MED ORDER — PROCHLORPERAZINE MALEATE 10 MG PO TABS
10.0000 mg | ORAL_TABLET | Freq: Once | ORAL | Status: AC
  Administered 2016-11-28: 10 mg via ORAL

## 2016-11-28 MED ORDER — BORTEZOMIB CHEMO SQ INJECTION 3.5 MG (2.5MG/ML)
1.5000 mg/m2 | Freq: Once | INTRAMUSCULAR | Status: AC
  Administered 2016-11-28: 3.25 mg via SUBCUTANEOUS
  Filled 2016-11-28: qty 3.25

## 2016-11-28 MED ORDER — PROCHLORPERAZINE MALEATE 10 MG PO TABS
ORAL_TABLET | ORAL | Status: AC
  Filled 2016-11-28: qty 1

## 2016-11-28 NOTE — Patient Instructions (Signed)
[facility] Discharge Instructions for Patients Receiving Chemotherapy  Today you received the following chemotherapy agents Velcade.  To help prevent nausea and vomiting after your treatment, we encourage you to take your nausea medication as directed.  If you develop nausea and vomiting that is not controlled by your nausea medication, call the clinic.   BELOW ARE SYMPTOMS THAT SHOULD BE REPORTED IMMEDIATELY:  *FEVER GREATER THAN 100.5 F  *CHILLS WITH OR WITHOUT FEVER  NAUSEA AND VOMITING THAT IS NOT CONTROLLED WITH YOUR NAUSEA MEDICATION  *UNUSUAL SHORTNESS OF BREATH  *UNUSUAL BRUISING OR BLEEDING  TENDERNESS IN MOUTH AND THROAT WITH OR WITHOUT PRESENCE OF ULCERS  *URINARY PROBLEMS  *BOWEL PROBLEMS  UNUSUAL RASH Items with * indicate a potential emergency and should be followed up as soon as possible.  Feel free to call the clinic should you have any questions or concerns. The clinic phone number is [phone].  Please show the CHEMO ALERT CARD at check-in to the Emergency Department and triage nurse.   

## 2016-11-28 NOTE — Telephone Encounter (Signed)
Printed avs and calender for upcoming appointment s trough December. Per 10/26 los

## 2016-11-28 NOTE — Progress Notes (Signed)
Hematology and Oncology Follow Up Visit  Alexander Saunders [id number] [birth year] 66 y.o. 11/28/2016 10:39 AM Alexander Saunders MDOsei-Bonsu, [name], MD   Principle Diagnosis: 66 year old gentleman diagnosed with multiple myeloma in July 2018. 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 His workup revealed diffuse bony involvement, 10% plasma myeloma in the bone marrow. He had a 0.1 g/dL M spike but no heavy chain immunofixation noted. His kappa light chain was elevated with an increased ratio of 4.25. His cytogenetics showed + 11 cytogenetic abnormality.    Prior Therapy:  He is status post surgical decompression of an epidural mass on 09/04/2016 and the pathology showed a plasma cell neoplasm.  Bone marrow biopsy obtained on 10/02/2016 showed 10% plasma cell involvement.  He is S/P adjuvant radiation therapy to the thoracic spine to be completed on 10/14/2016.  Current therapy:   He is currently receiving Velcade 1.5 mg/m weekly with dexamethasone 20 mg and Cytoxan 600 mg po. Therapy started on 10/24/2016.   Interim History:  Alexander Saunders presents today for a follow-up visit. This last visit, he continues to receive systemic therapy without complications related to it. He denied any nausea, vomiting, neuropathy or complications with oral Cytoxan.  He denies any excessive fatigue or tiredness.  He continues to attend activities of daily living without any decline.  His mobility is still limited because of neuropathy and slight weakness in his lower extremities.   His appetite is improving and has gained weight. His mobility is also has improved and ambulating without much help.  He denies any pathological fractures, bone pain or recent hospitalizations.  He does not report any headaches, blurry vision, syncope or seizures. He does not report any fevers, chills, sweats or weight loss. He does not report any chest pain, palpitation,  orthopnea or leg edema. He does not report any cough, wheezing or hemoptysis. He does not report any nausea, vomiting, abdominal pain, constipation, diarrhea or hematochezia. He does not report any frequency, urgency or loss of bladder control. He does not report any lymphadenopathy or petechiae. He does not report any pathological fractures. Remaining review of systems unremarkable  Medications: I have reviewed the patient's current medications.  Current Outpatient Prescriptions  Medication Sig Dispense Refill  . acyclovir (ZOVIRAX) 400 MG tablet Take 1 tablet (400 mg total) by mouth 2 (two) times daily. 60 tablet 3  . bisacodyl (DULCOLAX) 10 MG suppository Place 1 suppository (10 mg total) rectally daily as needed for moderate constipation. 12 suppository 0  . cyclophosphamide (CYTOXAN) 50 MG capsule Take 12 caps ('600mg'$ ) by mouth once weekly on day of chemotherapy.Give early in day, 1 hour before or 2 hours after meals. Maintain hydration 48 capsule 3  . dexamethasone (DECADRON) 4 MG tablet Take 5 tablets ('20mg'$ ) by mouth once weekly with breakfast on days of chemotherapy. 60 tablet 3  . diazepam (VALIUM) 5 MG tablet Take 1 tablet (5 mg total) by mouth every 6 (six) hours as needed for muscle spasms. 30 tablet 0  . diazepam (VALIUM) 5 MG tablet Take 1 tablet (5 mg total) by mouth every 8 (eight) hours as needed for muscle spasms. Take 0.5-1 tablet (2.5-'5mg'$ ) PO q8h PRN muscle spasms 10 tablet 0  . diphenhydrAMINE (BENADRYL) 25 MG tablet Take 25 mg by mouth every 6 (six) hours as needed for itching or allergies.     [name] gabapentin (NEURONTIN) 300 MG capsule Take 1 capsule (300 mg total) by mouth 3 (three) times daily.  90 capsule 3  . gentamicin (GARAMYCIN) 0.3 % ophthalmic solution Place 2 drops into both eyes 4 (four) times daily. 5 mL 0  . loratadine (CLARITIN) 10 MG tablet Take 10 mg by mouth daily as needed for allergies.    [name] menthol-cetylpyridinium (CEPACOL) 3 MG lozenge Take 1 lozenge (3 mg total)  by mouth as needed for sore throat (sore throat). 100 tablet 12  . oxyCODONE-acetaminophen (PERCOCET) 5-325 MG tablet Take 1-2 tablets by mouth every 6 (six) hours as needed for severe pain. 15 tablet 0  . oxyCODONE-acetaminophen (PERCOCET) 7.5-325 MG tablet Take 1 tablet by mouth every 4 (four) hours as needed for severe pain. 60 tablet 0  . oxyCODONE-acetaminophen (PERCOCET/ROXICET) 5-325 MG tablet Take 1 tablet by mouth every 6 (six) hours as needed for pain.    . pantoprazole (PROTONIX) 40 MG tablet Take 1 tablet (40 mg total) by mouth daily. 30 tablet 0  . polyethylene glycol (MIRALAX / GLYCOLAX) packet Take 17 g by mouth 2 (two) times daily. 14 each 0  . predniSONE (DELTASONE) 20 MG tablet 3 tabs po daily x 3 days starting 10/09/16 9 tablet 0  . prochlorperazine (COMPAZINE) 10 MG tablet Take 1 tablet (10 mg total) by mouth every 6 (six) hours as needed for nausea or vomiting. 30 tablet 0  . senna-docusate (SENOKOT-S) 8.6-50 MG tablet Take 1 tablet by mouth 2 (two) times daily. 60 tablet 0  . tamsulosin (FLOMAX) 0.4 MG CAPS capsule Take 1 capsule (0.4 mg total) by mouth daily. 30 capsule 0  . UNKNOWN TO PATIENT Place 2 drops into the right eye daily. Non-steroidal for inflammation.     No current facility-administered medications for this visit.      Allergies: No Known Allergies  Past Medical History, Surgical history, Social history, and Family History were reviewed and updated.  Physical Exam: Blood pressure 139/75, pulse 84, temperature 97.9 F (36.6 C), temperature source Oral, resp. rate 18, height 6' 0.25" (1.835 m), weight 191 lb (86.6 kg), SpO2 98 %. ECOG: 1 General appearance: Well-appearing gentleman without distress. Head: Normocephalic, without obvious abnormality no oral ulcers or lesions. Neck: no adenopathy Lymph nodes: Cervical, supraclavicular, and axillary nodes normal. Heart:regular rate and rhythm, S1, S2 normal, no murmur, click, rub or gallop Lung:chest clear,  no wheezing, rales, normal symmetric air entry Abdomin: soft, non-tender, without masses or organomegaly no rebound or guarding. EXT:no erythema, induration, or nodules   Lab Results: Lab Results  Component Value Date   WBC 8.6 11/28/2016   HGB 11.5 (L) 11/28/2016   HCT 34.9 (L) 11/28/2016   MCV 96.9 11/28/2016   PLT 270 11/28/2016     Chemistry      Component Value Date/Time   NA 140 11/21/2016 0908   K 4.0 11/21/2016 0908   CL 104 10/08/2016 1304   CO2 27 11/21/2016 0908   BUN 12.9 11/21/2016 0908   CREATININE 0.9 11/21/2016 0908      Component Value Date/Time   CALCIUM 9.7 11/21/2016 0908   ALKPHOS 33 (L) 11/21/2016 0908   AST 38 (H) 11/21/2016 0908   ALT 50 11/21/2016 0908   BILITOT 0.30 11/21/2016 0908     Results for KRISTAPHER, DUBUQUE (MRN [id number]) as of 11/28/2016 10:37  Ref. Range 09/26/2016 08:01 11/21/2016 09:08  M Protein SerPl Elph-Mcnc Latest Ref Range: Not Observed g/dL 0.1 (H) Not Observed  Results for ADRIANA, LINA (MRN [id number]) as of 11/28/2016 10:37  Ref. Range 09/26/2016 08:01 11/21/2016 09:08  Ig Kappa  Free Light Chain Latest Ref Range: 3.3 - 19.4 mg/L 37.8 (H) 21.9 (H)  Ig Lambda Free Light Chain Latest Ref Range: 5.7 - 26.3 mg/L 8.9 8.6  Kappa/Lambda FluidC Ratio Latest Ref Range: 0.26 - 1.65  4.25 (H) 2.55 (H)     Impression and Plan:  66 year old gentleman with the following issues:  1. Plasma cell neoplasm diagnosed and July 2018. He presented with lower extremity weakness and found to have a epidural mass around T4-T6 that required urgent surgical decompression on 09/04/2016. The follow-up pathology confirmed the presence of a plasma cell neoplasm. CT scan of the chest abdomen and pelvis showed no solid tumor malignancy but diffuse bone involvement including a 2.8 cm in the posterior aspect of the right acetabulum.  Bone marrow biopsy showed 10% plasma cell neoplasm and increased Kappa Light chain in the blood.  He is currently  receiving Velcade, dexamethasone and Cytoxan weekly. He tolerated therapy well without complications.  Protein studies obtained on 11/21/2016 showed his M spike has resolved and his kappa to lambda ratio improving and approaching normal range.  Plan is to continue with the same regimen for the time being and I will refer him to [facility] for evaluation for possible stem cell transplant for consolidation purposes.   2. Epidural tumor compression: He status post surgical decompression accomplished on 09/04/2016. He is completed radiation therapy adjuvantly on 10/14/2016.  3. Bone health: He received the first of Zometa on 09/26/2016 and will be repeated monthly.  4. Anemia: Very mild at this time and likely related to plasma cell disorder. He does not require any transfusion or growth factor support.  7. Prognosis: He remains an excellent performance status and this is a treatable malignancy and achieving long-term remission would be the goal.  8. Follow-up: Will be weekly for treatment and in 4 weeks to follow his progress.      Zola Button, MD 10/26/201810:39 AM

## 2016-12-01 ENCOUNTER — Encounter: Payer: Self-pay | Admitting: *Deleted

## 2016-12-01 NOTE — Progress Notes (Signed)
Per Dr. Alen Blew, I made a referral to [facility]. Referral faxed to [phone]. Patient to see Dr. Reola Calkins on December, 17th at 07:45 am. [facility] to call patient and send him a new patient packet.

## 2016-12-02 ENCOUNTER — Encounter: Payer: Self-pay | Admitting: *Deleted

## 2016-12-05 ENCOUNTER — Ambulatory Visit (HOSPITAL_BASED_OUTPATIENT_CLINIC_OR_DEPARTMENT_OTHER): Payer: Medicare Other

## 2016-12-05 ENCOUNTER — Other Ambulatory Visit (HOSPITAL_BASED_OUTPATIENT_CLINIC_OR_DEPARTMENT_OTHER): Payer: Medicare Other

## 2016-12-05 VITALS — BP 147/77 | HR 82 | Temp 97.8°F | Resp 18 | Wt 186.8 lb

## 2016-12-05 DIAGNOSIS — Z5112 Encounter for antineoplastic immunotherapy: Secondary | ICD-10-CM

## 2016-12-05 DIAGNOSIS — C9000 Multiple myeloma not having achieved remission: Secondary | ICD-10-CM

## 2016-12-05 LAB — COMPREHENSIVE METABOLIC PANEL
ALT: 13 U/L (ref 0–55)
AST: 18 U/L (ref 5–34)
Albumin: 3.9 g/dL (ref 3.5–5.0)
Alkaline Phosphatase: 26 U/L — ABNORMAL LOW (ref 40–150)
Anion Gap: 8 mEq/L (ref 3–11)
BUN: 10 mg/dL (ref 7.0–26.0)
CO2: 25 mEq/L (ref 22–29)
Calcium: 9.3 mg/dL (ref 8.4–10.4)
Chloride: 104 mEq/L (ref 98–109)
Creatinine: 0.8 mg/dL (ref 0.7–1.3)
EGFR: >60 mL/min/{1.73_m2} (ref >60)
Glucose: 88 mg/dl (ref 70–140)
Potassium: 3.8 mEq/L (ref 3.5–5.1)
Sodium: 138 mEq/L (ref 136–145)
Total Bilirubin: 0.47 mg/dL (ref 0.20–1.20)
Total Protein: 6.3 g/dL — ABNORMAL LOW (ref 6.4–8.3)

## 2016-12-05 LAB — CBC WITH DIFFERENTIAL/PLATELET
BASO%: 0.9 % (ref 0.0–2.0)
Basophils Absolute: 0 10*3/uL (ref 0.0–0.1)
EOS%: 1.5 % (ref 0.0–7.0)
Eosinophils Absolute: 0.1 10*3/uL (ref 0.0–0.5)
HCT: 33 % — ABNORMAL LOW (ref 38.4–49.9)
HGB: 11 g/dL — ABNORMAL LOW (ref 13.0–17.1)
LYMPH%: 10.5 % — ABNORMAL LOW (ref 14.0–49.0)
MCH: 32.3 pg (ref 27.2–33.4)
MCHC: 33.4 g/dL (ref 32.0–36.0)
MCV: 96.6 fL (ref 79.3–98.0)
MONO#: 0.6 10*3/uL (ref 0.1–0.9)
MONO%: 13.1 % (ref 0.0–14.0)
NEUT#: 3.1 10*3/uL (ref 1.5–6.5)
NEUT%: 74 % (ref 39.0–75.0)
Platelets: 313 10*3/uL (ref 140–400)
RBC: 3.41 10*6/uL — ABNORMAL LOW (ref 4.20–5.82)
RDW: 16.7 % — ABNORMAL HIGH (ref 11.0–14.6)
WBC: 4.2 10*3/uL (ref 4.0–10.3)
lymph#: 0.4 10*3/uL — ABNORMAL LOW (ref 0.9–3.3)

## 2016-12-05 MED ORDER — PROCHLORPERAZINE MALEATE 10 MG PO TABS: 10.0000 mg | ORAL_TABLET | Freq: Once | ORAL | Status: DC

## 2016-12-05 MED ORDER — BORTEZOMIB CHEMO SQ INJECTION 3.5 MG (2.5MG/ML)
1.5000 mg/m2 | Freq: Once | INTRAMUSCULAR | Status: AC
  Administered 2016-12-05: 3.25 mg via SUBCUTANEOUS
  Filled 2016-12-05: qty 3.25

## 2016-12-05 NOTE — Patient Instructions (Signed)
[facility] Discharge Instructions for Patients Receiving Chemotherapy  Today you received the following chemotherapy agents Velcade.  To help prevent nausea and vomiting after your treatment, we encourage you to take your nausea medication as directed.  If you develop nausea and vomiting that is not controlled by your nausea medication, call the clinic.   BELOW ARE SYMPTOMS THAT SHOULD BE REPORTED IMMEDIATELY:  *FEVER GREATER THAN 100.5 F  *CHILLS WITH OR WITHOUT FEVER  NAUSEA AND VOMITING THAT IS NOT CONTROLLED WITH YOUR NAUSEA MEDICATION  *UNUSUAL SHORTNESS OF BREATH  *UNUSUAL BRUISING OR BLEEDING  TENDERNESS IN MOUTH AND THROAT WITH OR WITHOUT PRESENCE OF ULCERS  *URINARY PROBLEMS  *BOWEL PROBLEMS  UNUSUAL RASH Items with * indicate a potential emergency and should be followed up as soon as possible.  Feel free to call the clinic should you have any questions or concerns. The clinic phone number is [phone].  Please show the CHEMO ALERT CARD at check-in to the Emergency Department and triage nurse.   

## 2016-12-08 ENCOUNTER — Telehealth: Payer: Self-pay

## 2016-12-08 NOTE — Telephone Encounter (Signed)
Pt is getting neuropathy. He is unstable on his feet and using his cane every day since Friday 11/2. It is a heavy numb feeling. R leg more than left. It has been getting worse over the last 2 weeks. Since last OV 10/26 when he was not using his cane.  He would like to hear what Dr Hazeline Junker recommendations for this are.  Pt is constipated. LBM 5 days ago. He was using herbal tea which stopped working. He has been using miralax 1 cap daily for the last 2 days. Getting a lot of gas.  Is bloated, but no stomach ache or belly pain, no fever, a little nausea that compazine helped, no vomiting.  Is drinking about 100 oz water per day.  Instructed him to use mag citrate 1/2 bottle and repeat in 4 hours if no results. And discussed increasing miralax and adding colace prn.  His next tx is 11/9 and 11/16 His next MD visit is 11/23

## 2016-12-12 ENCOUNTER — Ambulatory Visit (HOSPITAL_BASED_OUTPATIENT_CLINIC_OR_DEPARTMENT_OTHER): Payer: Medicare Other

## 2016-12-12 ENCOUNTER — Other Ambulatory Visit (HOSPITAL_BASED_OUTPATIENT_CLINIC_OR_DEPARTMENT_OTHER): Payer: Medicare Other

## 2016-12-12 VITALS — BP 136/81 | HR 80 | Temp 98.1°F | Resp 18 | Wt 190.5 lb

## 2016-12-12 DIAGNOSIS — Z5112 Encounter for antineoplastic immunotherapy: Secondary | ICD-10-CM | POA: Diagnosis present

## 2016-12-12 DIAGNOSIS — C9000 Multiple myeloma not having achieved remission: Secondary | ICD-10-CM

## 2016-12-12 LAB — COMPREHENSIVE METABOLIC PANEL
ALT: 22 U/L (ref 0–55)
AST: 20 U/L (ref 5–34)
Albumin: 3.7 g/dL (ref 3.5–5.0)
Alkaline Phosphatase: 28 U/L — ABNORMAL LOW (ref 40–150)
Anion Gap: 8 mEq/L (ref 3–11)
BUN: 16 mg/dL (ref 7.0–26.0)
CO2: 23 mEq/L (ref 22–29)
Calcium: 9.7 mg/dL (ref 8.4–10.4)
Chloride: 104 mEq/L (ref 98–109)
Creatinine: 0.8 mg/dL (ref 0.7–1.3)
EGFR: >60 mL/min/{1.73_m2} (ref >60)
Glucose: 95 mg/dl (ref 70–140)
Potassium: 4.3 mEq/L (ref 3.5–5.1)
Sodium: 135 mEq/L — ABNORMAL LOW (ref 136–145)
Total Bilirubin: <0.22 mg/dL (ref 0.20–1.20)
Total Protein: 6.2 g/dL — ABNORMAL LOW (ref 6.4–8.3)

## 2016-12-12 LAB — CBC WITH DIFFERENTIAL/PLATELET
BASO%: 0.3 % (ref 0.0–2.0)
Basophils Absolute: 0 10*3/uL (ref 0.0–0.1)
EOS%: 2.1 % (ref 0.0–7.0)
Eosinophils Absolute: 0.1 10*3/uL (ref 0.0–0.5)
HCT: 34.3 % — ABNORMAL LOW (ref 38.4–49.9)
HGB: 11.3 g/dL — ABNORMAL LOW (ref 13.0–17.1)
LYMPH%: 5.6 % — ABNORMAL LOW (ref 14.0–49.0)
MCH: 32 pg (ref 27.2–33.4)
MCHC: 32.9 g/dL (ref 32.0–36.0)
MCV: 97.2 fL (ref 79.3–98.0)
MONO#: 0.7 10*3/uL (ref 0.1–0.9)
MONO%: 9.7 % (ref 0.0–14.0)
NEUT#: 5.6 10*3/uL (ref 1.5–6.5)
NEUT%: 82.3 % — ABNORMAL HIGH (ref 39.0–75.0)
Platelets: 324 10*3/uL (ref 140–400)
RBC: 3.53 10*6/uL — ABNORMAL LOW (ref 4.20–5.82)
RDW: 15.5 % — ABNORMAL HIGH (ref 11.0–14.6)
WBC: 6.8 10*3/uL (ref 4.0–10.3)
lymph#: 0.4 10*3/uL — ABNORMAL LOW (ref 0.9–3.3)

## 2016-12-12 MED ORDER — PROCHLORPERAZINE MALEATE 10 MG PO TABS
ORAL_TABLET | ORAL | Status: AC
  Filled 2016-12-12: qty 1

## 2016-12-12 MED ORDER — BORTEZOMIB CHEMO SQ INJECTION 3.5 MG (2.5MG/ML)
1.5000 mg/m2 | Freq: Once | INTRAMUSCULAR | Status: AC
  Administered 2016-12-12: 3.25 mg via SUBCUTANEOUS
  Filled 2016-12-12: qty 1.3

## 2016-12-12 MED ORDER — PROCHLORPERAZINE MALEATE 10 MG PO TABS: 10.0000 mg | ORAL_TABLET | Freq: Once | ORAL | Status: DC

## 2016-12-12 MED FILL — CYCLOPHOSPHAMIDE 50 MG CAP: 50 | 28 days supply | Qty: 48 | Fill #2

## 2016-12-12 NOTE — Patient Instructions (Signed)
[facility] Discharge Instructions for Patients Receiving Chemotherapy  Today you received the following chemotherapy agents Velcade.  To help prevent nausea and vomiting after your treatment, we encourage you to take your nausea medication as directed.  If you develop nausea and vomiting that is not controlled by your nausea medication, call the clinic.   BELOW ARE SYMPTOMS THAT SHOULD BE REPORTED IMMEDIATELY:  *FEVER GREATER THAN 100.5 F  *CHILLS WITH OR WITHOUT FEVER  NAUSEA AND VOMITING THAT IS NOT CONTROLLED WITH YOUR NAUSEA MEDICATION  *UNUSUAL SHORTNESS OF BREATH  *UNUSUAL BRUISING OR BLEEDING  TENDERNESS IN MOUTH AND THROAT WITH OR WITHOUT PRESENCE OF ULCERS  *URINARY PROBLEMS  *BOWEL PROBLEMS  UNUSUAL RASH Items with * indicate a potential emergency and should be followed up as soon as possible.  Feel free to call the clinic should you have any questions or concerns. The clinic phone number is [phone].  Please show the CHEMO ALERT CARD at check-in to the Emergency Department and triage nurse.   

## 2016-12-12 NOTE — Progress Notes (Signed)
Pt c/o bilat leg weakness and neuropathy for last several days.  Denies changes in continence.  A&Ox4 and ambulatory.  MD [facility] aware, at chairside to assess pt.  MD gave okay to treat w/velcade today.

## 2016-12-15 LAB — KAPPA/LAMBDA LIGHT CHAINS
Ig Kappa Free Light Chain: 14.3 mg/L (ref 3.3–19.4)
Ig Lambda Free Light Chain: 6 mg/L (ref 5.7–26.3)
Kappa/Lambda FluidC Ratio: 2.38 — ABNORMAL HIGH (ref 0.26–1.65)

## 2016-12-17 LAB — MULTIPLE MYELOMA PANEL, SERUM
Albumin SerPl Elph-Mcnc: 3.6 g/dL (ref 2.9–4.4)
Albumin/Glob SerPl: 1.7 (ref 0.7–1.7)
Alpha 1: 0.2 g/dL (ref 0.0–0.4)
Alpha2 Glob SerPl Elph-Mcnc: 0.6 g/dL (ref 0.4–1.0)
B-Globulin SerPl Elph-Mcnc: 0.9 g/dL (ref 0.7–1.3)
Gamma Glob SerPl Elph-Mcnc: 0.6 g/dL (ref 0.4–1.8)
Globulin, Total: 2.2 g/dL (ref 2.2–3.9)
IgA, Qn, Serum: 64 mg/dL (ref 61–437)
IgG, Qn, Serum: 609 mg/dL — ABNORMAL LOW (ref 700–1600)
IgM, Qn, Serum: 14 mg/dL — ABNORMAL LOW (ref 20–172)
Total Protein: 5.8 g/dL — ABNORMAL LOW (ref 6.0–8.5)

## 2016-12-19 ENCOUNTER — Other Ambulatory Visit (HOSPITAL_BASED_OUTPATIENT_CLINIC_OR_DEPARTMENT_OTHER): Payer: Medicare Other

## 2016-12-19 ENCOUNTER — Ambulatory Visit (HOSPITAL_BASED_OUTPATIENT_CLINIC_OR_DEPARTMENT_OTHER): Payer: Medicare Other

## 2016-12-19 VITALS — BP 123/77 | HR 90 | Temp 98.3°F | Resp 18 | Wt 195.8 lb

## 2016-12-19 DIAGNOSIS — Z5112 Encounter for antineoplastic immunotherapy: Secondary | ICD-10-CM

## 2016-12-19 DIAGNOSIS — C9000 Multiple myeloma not having achieved remission: Secondary | ICD-10-CM

## 2016-12-19 LAB — CBC WITH DIFFERENTIAL/PLATELET
BASO%: 0.4 % (ref 0.0–2.0)
Basophils Absolute: 0 10*3/uL (ref 0.0–0.1)
EOS%: 2.2 % (ref 0.0–7.0)
Eosinophils Absolute: 0.1 10*3/uL (ref 0.0–0.5)
HCT: 34.7 % — ABNORMAL LOW (ref 38.4–49.9)
HGB: 11.6 g/dL — ABNORMAL LOW (ref 13.0–17.1)
LYMPH%: 10.4 % — ABNORMAL LOW (ref 14.0–49.0)
MCH: 32.5 pg (ref 27.2–33.4)
MCHC: 33.3 g/dL (ref 32.0–36.0)
MCV: 97.4 fL (ref 79.3–98.0)
MONO#: 0.5 10*3/uL (ref 0.1–0.9)
MONO%: 10.1 % (ref 0.0–14.0)
NEUT#: 4.2 10*3/uL (ref 1.5–6.5)
NEUT%: 76.9 % — ABNORMAL HIGH (ref 39.0–75.0)
Platelets: 312 10*3/uL (ref 140–400)
RBC: 3.56 10*6/uL — ABNORMAL LOW (ref 4.20–5.82)
RDW: 16.7 % — ABNORMAL HIGH (ref 11.0–14.6)
WBC: 5.5 10*3/uL (ref 4.0–10.3)
lymph#: 0.6 10*3/uL — ABNORMAL LOW (ref 0.9–3.3)

## 2016-12-19 LAB — COMPREHENSIVE METABOLIC PANEL
ALT: 41 U/L (ref 0–55)
AST: 46 U/L — ABNORMAL HIGH (ref 5–34)
Albumin: 3.6 g/dL (ref 3.5–5.0)
Alkaline Phosphatase: 27 U/L — ABNORMAL LOW (ref 40–150)
Anion Gap: 9 mEq/L (ref 3–11)
BUN: 17.7 mg/dL (ref 7.0–26.0)
CO2: 23 mEq/L (ref 22–29)
Calcium: 9.8 mg/dL (ref 8.4–10.4)
Chloride: 107 mEq/L (ref 98–109)
Creatinine: 0.9 mg/dL (ref 0.7–1.3)
EGFR: >60 mL/min/{1.73_m2} (ref >60)
Glucose: 102 mg/dl (ref 70–140)
Potassium: 3.9 mEq/L (ref 3.5–5.1)
Sodium: 139 mEq/L (ref 136–145)
Total Bilirubin: 0.25 mg/dL (ref 0.20–1.20)
Total Protein: 6.1 g/dL — ABNORMAL LOW (ref 6.4–8.3)

## 2016-12-19 MED ORDER — PROCHLORPERAZINE MALEATE 10 MG PO TABS: 10.0000 mg | ORAL_TABLET | Freq: Once | ORAL | Status: DC

## 2016-12-19 MED ORDER — PROCHLORPERAZINE MALEATE 10 MG PO TABS
ORAL_TABLET | ORAL | Status: AC
  Filled 2016-12-19: qty 1

## 2016-12-19 MED ORDER — BORTEZOMIB CHEMO SQ INJECTION 3.5 MG (2.5MG/ML)
1.5000 mg/m2 | Freq: Once | INTRAMUSCULAR | Status: AC
  Administered 2016-12-19: 3.25 mg via SUBCUTANEOUS
  Filled 2016-12-19: qty 3.25

## 2016-12-19 NOTE — Progress Notes (Signed)
Pt with AST of 46 okay tor treat per Dr. Alen Blew.

## 2016-12-19 NOTE — Patient Instructions (Signed)
[facility] Discharge Instructions for Patients Receiving Chemotherapy  Today you received the following chemotherapy agents Velcade.  To help prevent nausea and vomiting after your treatment, we encourage you to take your nausea medication as directed.  If you develop nausea and vomiting that is not controlled by your nausea medication, call the clinic.   BELOW ARE SYMPTOMS THAT SHOULD BE REPORTED IMMEDIATELY:  *FEVER GREATER THAN 100.5 F  *CHILLS WITH OR WITHOUT FEVER  NAUSEA AND VOMITING THAT IS NOT CONTROLLED WITH YOUR NAUSEA MEDICATION  *UNUSUAL SHORTNESS OF BREATH  *UNUSUAL BRUISING OR BLEEDING  TENDERNESS IN MOUTH AND THROAT WITH OR WITHOUT PRESENCE OF ULCERS  *URINARY PROBLEMS  *BOWEL PROBLEMS  UNUSUAL RASH Items with * indicate a potential emergency and should be followed up as soon as possible.  Feel free to call the clinic should you have any questions or concerns. The clinic phone number is [phone].  Please show the CHEMO ALERT CARD at check-in to the Emergency Department and triage nurse.   

## 2016-12-26 ENCOUNTER — Telehealth: Payer: Self-pay | Admitting: Oncology

## 2016-12-26 ENCOUNTER — Other Ambulatory Visit (HOSPITAL_BASED_OUTPATIENT_CLINIC_OR_DEPARTMENT_OTHER): Payer: Medicare Other

## 2016-12-26 ENCOUNTER — Ambulatory Visit (HOSPITAL_BASED_OUTPATIENT_CLINIC_OR_DEPARTMENT_OTHER): Payer: Medicare Other | Admitting: Oncology

## 2016-12-26 ENCOUNTER — Ambulatory Visit (HOSPITAL_BASED_OUTPATIENT_CLINIC_OR_DEPARTMENT_OTHER): Payer: Medicare Other

## 2016-12-26 VITALS — BP 137/83 | HR 85 | Temp 97.9°F | Resp 17 | Ht 72.25 in | Wt 196.3 lb

## 2016-12-26 DIAGNOSIS — D649 Anemia, unspecified: Secondary | ICD-10-CM

## 2016-12-26 DIAGNOSIS — Z5112 Encounter for antineoplastic immunotherapy: Secondary | ICD-10-CM | POA: Diagnosis present

## 2016-12-26 DIAGNOSIS — C9000 Multiple myeloma not having achieved remission: Secondary | ICD-10-CM

## 2016-12-26 LAB — CBC WITH DIFFERENTIAL/PLATELET
BASO%: 0.6 % (ref 0.0–2.0)
Basophils Absolute: 0 10*3/uL (ref 0.0–0.1)
EOS%: 1.9 % (ref 0.0–7.0)
Eosinophils Absolute: 0.1 10*3/uL (ref 0.0–0.5)
HCT: 36.1 % — ABNORMAL LOW (ref 38.4–49.9)
HGB: 12 g/dL — ABNORMAL LOW (ref 13.0–17.1)
LYMPH%: 13.2 % — ABNORMAL LOW (ref 14.0–49.0)
MCH: 32.3 pg (ref 27.2–33.4)
MCHC: 33.2 g/dL (ref 32.0–36.0)
MCV: 97.3 fL (ref 79.3–98.0)
MONO#: 0.8 10*3/uL (ref 0.1–0.9)
MONO%: 15.6 % — ABNORMAL HIGH (ref 0.0–14.0)
NEUT#: 3.5 10*3/uL (ref 1.5–6.5)
NEUT%: 68.7 % (ref 39.0–75.0)
Platelets: 269 10*3/uL (ref 140–400)
RBC: 3.71 10*6/uL — ABNORMAL LOW (ref 4.20–5.82)
RDW: 15.7 % — ABNORMAL HIGH (ref 11.0–14.6)
WBC: 5.1 10*3/uL (ref 4.0–10.3)
lymph#: 0.7 10*3/uL — ABNORMAL LOW (ref 0.9–3.3)

## 2016-12-26 LAB — COMPREHENSIVE METABOLIC PANEL
ALT: 97 U/L — ABNORMAL HIGH (ref 0–55)
AST: 73 U/L — ABNORMAL HIGH (ref 5–34)
Albumin: 3.8 g/dL (ref 3.5–5.0)
Alkaline Phosphatase: 32 U/L — ABNORMAL LOW (ref 40–150)
Anion Gap: 9 mEq/L (ref 3–11)
BUN: 12.7 mg/dL (ref 7.0–26.0)
CO2: 25 mEq/L (ref 22–29)
Calcium: 9.7 mg/dL (ref 8.4–10.4)
Chloride: 107 mEq/L (ref 98–109)
Creatinine: 0.8 mg/dL (ref 0.7–1.3)
EGFR: >60 mL/min/{1.73_m2} (ref >60)
Glucose: 68 mg/dl — ABNORMAL LOW (ref 70–140)
Potassium: 3.9 mEq/L (ref 3.5–5.1)
Sodium: 141 mEq/L (ref 136–145)
Total Bilirubin: 0.26 mg/dL (ref 0.20–1.20)
Total Protein: 6.4 g/dL (ref 6.4–8.3)

## 2016-12-26 MED ORDER — ZOLEDRONIC ACID 4 MG/100ML IV SOLN
4.0000 mg | Freq: Once | INTRAVENOUS | Status: AC
  Administered 2016-12-26: 4 mg via INTRAVENOUS
  Filled 2016-12-26: qty 100

## 2016-12-26 MED ORDER — PROCHLORPERAZINE MALEATE 10 MG PO TABS: 10.0000 mg | ORAL_TABLET | Freq: Once | ORAL | Status: DC

## 2016-12-26 MED ORDER — PROCHLORPERAZINE MALEATE 10 MG PO TABS
ORAL_TABLET | ORAL | Status: AC
  Filled 2016-12-26: qty 1

## 2016-12-26 MED ORDER — SODIUM CHLORIDE 0.9 % IV SOLN
Freq: Once | INTRAVENOUS | Status: AC
  Administered 2016-12-26: 13:00:00 via INTRAVENOUS

## 2016-12-26 MED ORDER — BORTEZOMIB CHEMO SQ INJECTION 3.5 MG (2.5MG/ML)
1.5000 mg/m2 | Freq: Once | INTRAMUSCULAR | Status: AC
  Administered 2016-12-26: 3.25 mg via SUBCUTANEOUS
  Filled 2016-12-26: qty 3.25

## 2016-12-26 NOTE — Patient Instructions (Signed)
[facility] Discharge Instructions for Patients Receiving Chemotherapy  Today you received the following chemotherapy agents Velcade, Zometa.  To help prevent nausea and vomiting after your treatment, we encourage you to take your nausea medication as directed   If you develop nausea and vomiting that is not controlled by your nausea medication, call the clinic.   BELOW ARE SYMPTOMS THAT SHOULD BE REPORTED IMMEDIATELY:  *FEVER GREATER THAN 100.5 F  *CHILLS WITH OR WITHOUT FEVER  NAUSEA AND VOMITING THAT IS NOT CONTROLLED WITH YOUR NAUSEA MEDICATION  *UNUSUAL SHORTNESS OF BREATH  *UNUSUAL BRUISING OR BLEEDING  TENDERNESS IN MOUTH AND THROAT WITH OR WITHOUT PRESENCE OF ULCERS  *URINARY PROBLEMS  *BOWEL PROBLEMS  UNUSUAL RASH Items with * indicate a potential emergency and should be followed up as soon as possible.  Feel free to call the clinic should you have any questions or concerns. The clinic phone number is [phone].  Please show the CHEMO ALERT CARD at check-in to the Emergency Department and triage nurse.   

## 2016-12-26 NOTE — Progress Notes (Signed)
Hematology and Oncology Follow Up Visit  Alexander Saunders [id number] 1950-08-04 66 y.o. 12/26/2016 11:44 AM [name], [name] MDOsei-Bonsu, [name], MD   Principle Diagnosis: 66 year old gentleman diagnosed with multiple myeloma in July 2018. 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 His workup revealed diffuse bony involvement, 10% plasma myeloma in the bone marrow. He had a 0.1 g/dL M spike but no heavy chain immunofixation noted. His kappa light chain was elevated with an increased ratio of 4.25. His cytogenetics showed + 11 cytogenetic abnormality.    Prior Therapy:  He is status post surgical decompression of an epidural mass on 09/04/2016 and the pathology showed a plasma cell neoplasm.  Bone marrow biopsy obtained on 10/02/2016 showed 10% plasma cell involvement.  He is S/P adjuvant radiation therapy to the thoracic spine to be completed on 10/14/2016.  Current therapy:   He is currently receiving Velcade 1.5 mg/m weekly with dexamethasone 20 mg and Cytoxan 600 mg po. Therapy started on 10/24/2016. He is here to receive cycle 10 of therapy.   Interim History:  Alexander Saunders presents today for a follow-up visit. This last visit, he reports no recent complaints.  He continues to receive therapy without complaints.  He denied any nausea, vomiting, neuropathy or complications with oral Cytoxan.  He denies any excessive fatigue or tiredness.  He continues to attend activities of daily living without any decline.  His mobility is still limited because of neuropathy and slight weakness in his lower extremities.  He denies any falls or syncope.  He denied any any hindrance and ability to drive.  His appetite and performance status remains excellent.   He does not report any headaches, blurry vision, syncope or seizures. He does not report any fevers, chills, sweats or weight loss. He does not report any chest pain, palpitation, orthopnea  or leg edema. He does not report any cough, wheezing or hemoptysis. He does not report any nausea, vomiting, abdominal pain, constipation, diarrhea or hematochezia. He does not report any frequency, urgency or loss of bladder control. He does not report any lymphadenopathy or petechiae. He does not report any pathological fractures. Remaining review of systems unremarkable  Medications: I have reviewed the patient's current medications.  Current Outpatient Medications  Medication Sig Dispense Refill  . acyclovir (ZOVIRAX) 400 MG tablet Take 1 tablet (400 mg total) by mouth 2 (two) times daily. 60 tablet 3  . bisacodyl (DULCOLAX) 10 MG suppository Place 1 suppository (10 mg total) rectally daily as needed for moderate constipation. 12 suppository 0  . cyclophosphamide (CYTOXAN) 50 MG capsule Take 12 caps ('600mg'$ ) by mouth once weekly on day of chemotherapy.Give early in day, 1 hour before or 2 hours after meals. Maintain hydration 48 capsule 3  . dexamethasone (DECADRON) 4 MG tablet Take 5 tablets ('20mg'$ ) by mouth once weekly with breakfast on days of chemotherapy. 60 tablet 3  . diazepam (VALIUM) 5 MG tablet Take 1 tablet (5 mg total) by mouth every 6 (six) hours as needed for muscle spasms. 30 tablet 0  . diazepam (VALIUM) 5 MG tablet Take 1 tablet (5 mg total) by mouth every 8 (eight) hours as needed for muscle spasms. Take 0.5-1 tablet (2.5-'5mg'$ ) PO q8h PRN muscle spasms 10 tablet 0  . diphenhydrAMINE (BENADRYL) 25 MG tablet Take 25 mg by mouth every 6 (six) hours as needed for itching or allergies.     [name] gabapentin (NEURONTIN) 300 MG capsule Take 1 capsule (300 mg total)  by mouth 3 (three) times daily. 90 capsule 3  . gentamicin (GARAMYCIN) 0.3 % ophthalmic solution Place 2 drops into both eyes 4 (four) times daily. 5 mL 0  . loratadine (CLARITIN) 10 MG tablet Take 10 mg by mouth daily as needed for allergies.    [name] menthol-cetylpyridinium (CEPACOL) 3 MG lozenge Take 1 lozenge (3 mg total) by mouth as  needed for sore throat (sore throat). 100 tablet 12  . oxyCODONE-acetaminophen (PERCOCET) 5-325 MG tablet Take 1-2 tablets by mouth every 6 (six) hours as needed for severe pain. 15 tablet 0  . oxyCODONE-acetaminophen (PERCOCET) 7.5-325 MG tablet Take 1 tablet by mouth every 4 (four) hours as needed for severe pain. 60 tablet 0  . oxyCODONE-acetaminophen (PERCOCET/ROXICET) 5-325 MG tablet Take 1 tablet by mouth every 6 (six) hours as needed for pain.    . pantoprazole (PROTONIX) 40 MG tablet Take 1 tablet (40 mg total) by mouth daily. 30 tablet 0  . polyethylene glycol (MIRALAX / GLYCOLAX) packet Take 17 g by mouth 2 (two) times daily. 14 each 0  . predniSONE (DELTASONE) 20 MG tablet 3 tabs po daily x 3 days starting 10/09/16 9 tablet 0  . prochlorperazine (COMPAZINE) 10 MG tablet Take 1 tablet (10 mg total) by mouth every 6 (six) hours as needed for nausea or vomiting. 30 tablet 0  . senna-docusate (SENOKOT-S) 8.6-50 MG tablet Take 1 tablet by mouth 2 (two) times daily. 60 tablet 0  . tamsulosin (FLOMAX) 0.4 MG CAPS capsule Take 1 capsule (0.4 mg total) by mouth daily. 30 capsule 0  . UNKNOWN TO PATIENT Place 2 drops into the right eye daily. Non-steroidal for inflammation.     No current facility-administered medications for this visit.      Allergies: No Known Allergies  Past Medical History, Surgical history, Social history, and Family History were reviewed and updated.  Physical Exam: Blood pressure 137/83, pulse 85, temperature 97.9 F (36.6 C), temperature source Oral, resp. rate 17, height 6' 0.25" (1.835 m), weight 196 lb 4.8 oz (89 kg), SpO2 98 %. ECOG: 1 General appearance: Alert, awake gentleman without distress. Head: Normocephalic, without obvious abnormality no oral thrush or ulcers. Neck: no adenopathy Lymph nodes: Cervical, supraclavicular, and axillary nodes normal. Heart:regular rate and rhythm, S1, S2 normal, no murmur, click, rub or gallop Lung:chest clear, no  wheezing, rales, normal symmetric air entry Abdomin: soft, non-tender, without masses or organomegaly no pitting dullness or ascites. EXT:no erythema, induration, or nodules   Lab Results: Lab Results  Component Value Date   WBC 5.1 12/26/2016   HGB 12.0 (L) 12/26/2016   HCT 36.1 (L) 12/26/2016   MCV 97.3 12/26/2016   PLT 269 12/26/2016     Chemistry      Component Value Date/Time   NA 141 12/26/2016 1112   K 3.9 12/26/2016 1112   CL 104 10/08/2016 1304   CO2 25 12/26/2016 1112   BUN 12.7 12/26/2016 1112   CREATININE 0.8 12/26/2016 1112      Component Value Date/Time   CALCIUM 9.7 12/26/2016 1112   ALKPHOS 32 (L) 12/26/2016 1112   AST 73 (H) 12/26/2016 1112   ALT 97 (H) 12/26/2016 1112   BILITOT 0.26 12/26/2016 1112      Results for FREDRIK, MOGEL (MRN [id number]) as of 12/26/2016 11:46  Ref. Range 09/26/2016 08:01 11/21/2016 09:08 12/12/2016 08:17  M Protein SerPl Elph-Mcnc Latest Ref Range: Not Observed g/dL 0.1 (H) Not Observed Not Observed   Results for DAQUAN, CRAPPS  A (MRN [id number]) as of 12/26/2016 11:46  Ref. Range 09/26/2016 08:01 11/21/2016 09:08 12/12/2016 08:17  Ig Kappa Free Light Chain Latest Ref Range: 3.3 - 19.4 mg/L 37.8 (H) 21.9 (H) 14.3  Ig Lambda Free Light Chain Latest Ref Range: 5.7 - 26.3 mg/L 8.9 8.6 6.0  Kappa/Lambda FluidC Ratio Latest Ref Range: 0.26 - 1.65  4.25 (H) 2.55 (H) 2.38 (H)    Impression and Plan:  66 year old gentleman with the following issues:  1. Plasma cell neoplasm diagnosed and July 2018. He presented with lower extremity weakness and found to have a epidural mass around T4-T6 that required urgent surgical decompression on 09/04/2016. The follow-up pathology confirmed the presence of a plasma cell neoplasm. CT scan of the chest abdomen and pelvis showed no solid tumor malignancy but diffuse bone involvement including a 2.8 cm in the posterior aspect of the right acetabulum.  Bone marrow biopsy showed 10% plasma cell  neoplasm and increased Kappa Light chain in the blood.  He is currently receiving Velcade, dexamethasone and Cytoxan weekly. He tolerated therapy well without complications.  Protein studies obtained on 12/12/2016 were personally reviewed and discussed with the patient.  His M spike is no longer detected and his kappa free light chain has normalized.  He continues to have slightly elevated kappa to lambda ratio but approaching normal range.  The plan is to continue with the same dose and schedule for the time being until he achieves a complete response.  He has been referred to [facility] for evaluation for high-dose chemotherapy and stem cell transplant.   2. Epidural tumor compression: He status post surgical decompression accomplished on 09/04/2016. He is completed radiation therapy adjuvantly on 10/14/2016.  3. Bone health: He received the first of Zometa on 09/26/2016 and will be repeated monthly.  4. Anemia: His hemoglobin is improved without any need for transfusion.  7. Prognosis: He remains an excellent performance status and this is a treatable malignancy.   8.  Peripheral neuropathy: His symptoms have proceeded Velcade therapy but is possible it is exacerbating his symptoms.  We will continue to monitor this and consider dose reductions in the future.  9. Follow-up: Will be weekly for treatment and in 4 weeks to follow his progress.      Zola Button, MD 11/23/201811:44 AM

## 2016-12-26 NOTE — Telephone Encounter (Signed)
Gave avs and calendar for December - February 2019 °

## 2016-12-26 NOTE — Progress Notes (Signed)
Pt CBC and CMP reviewed okay to treat per Dr. Alen Blew.

## 2016-12-29 ENCOUNTER — Telehealth: Payer: Self-pay | Admitting: *Deleted

## 2016-12-29 MED FILL — ACYCLOVIR 400 MG TABLET: 400 | 30 days supply | Qty: 60 | Fill #2

## 2016-12-29 NOTE — Telephone Encounter (Signed)
  Patient called and left a voice mail message stating,"I need to reschedule my appointments this May 05, 2022 due to a death in the family. My return number is [phone]." LOS sent to scheduling.

## 2017-01-02 ENCOUNTER — Other Ambulatory Visit: Payer: Medicare Other

## 2017-01-02 ENCOUNTER — Ambulatory Visit: Payer: Medicare Other

## 2017-01-06 DIAGNOSIS — H538 Other visual disturbances: Secondary | ICD-10-CM | POA: Diagnosis not present

## 2017-01-06 DIAGNOSIS — D638 Anemia in other chronic diseases classified elsewhere: Secondary | ICD-10-CM | POA: Diagnosis not present

## 2017-01-06 DIAGNOSIS — Z131 Encounter for screening for diabetes mellitus: Secondary | ICD-10-CM | POA: Diagnosis not present

## 2017-01-06 DIAGNOSIS — Z01118 Encounter for examination of ears and hearing with other abnormal findings: Secondary | ICD-10-CM | POA: Diagnosis not present

## 2017-01-06 DIAGNOSIS — M545 Low back pain: Secondary | ICD-10-CM | POA: Diagnosis not present

## 2017-01-06 DIAGNOSIS — Z125 Encounter for screening for malignant neoplasm of prostate: Secondary | ICD-10-CM | POA: Diagnosis not present

## 2017-01-06 DIAGNOSIS — N529 Male erectile dysfunction, unspecified: Secondary | ICD-10-CM | POA: Diagnosis not present

## 2017-01-06 DIAGNOSIS — H9209 Otalgia, unspecified ear: Secondary | ICD-10-CM | POA: Diagnosis not present

## 2017-01-06 DIAGNOSIS — R269 Unspecified abnormalities of gait and mobility: Secondary | ICD-10-CM | POA: Diagnosis not present

## 2017-01-06 DIAGNOSIS — D497 Neoplasm of unspecified behavior of endocrine glands and other parts of nervous system: Secondary | ICD-10-CM | POA: Diagnosis not present

## 2017-01-09 ENCOUNTER — Other Ambulatory Visit (HOSPITAL_BASED_OUTPATIENT_CLINIC_OR_DEPARTMENT_OTHER): Payer: Medicare Other

## 2017-01-09 ENCOUNTER — Other Ambulatory Visit: Payer: Self-pay | Admitting: *Deleted

## 2017-01-09 ENCOUNTER — Ambulatory Visit (HOSPITAL_BASED_OUTPATIENT_CLINIC_OR_DEPARTMENT_OTHER): Payer: Medicare Other

## 2017-01-09 VITALS — BP 126/74 | HR 63 | Temp 98.5°F | Resp 18 | Wt 195.4 lb

## 2017-01-09 DIAGNOSIS — Z5112 Encounter for antineoplastic immunotherapy: Secondary | ICD-10-CM

## 2017-01-09 DIAGNOSIS — C9000 Multiple myeloma not having achieved remission: Secondary | ICD-10-CM

## 2017-01-09 DIAGNOSIS — D649 Anemia, unspecified: Secondary | ICD-10-CM | POA: Diagnosis not present

## 2017-01-09 LAB — CBC WITH DIFFERENTIAL/PLATELET
BASO%: 0.2 % (ref 0.0–2.0)
Basophils Absolute: 0 10*3/uL (ref 0.0–0.1)
EOS%: 1.9 % (ref 0.0–7.0)
Eosinophils Absolute: 0.1 10*3/uL (ref 0.0–0.5)
HCT: 36.5 % — ABNORMAL LOW (ref 38.4–49.9)
HGB: 11.9 g/dL — ABNORMAL LOW (ref 13.0–17.1)
LYMPH%: 5.2 % — ABNORMAL LOW (ref 14.0–49.0)
MCH: 31.6 pg (ref 27.2–33.4)
MCHC: 32.6 g/dL (ref 32.0–36.0)
MCV: 97.1 fL (ref 79.3–98.0)
MONO#: 0.4 10*3/uL (ref 0.1–0.9)
MONO%: 6 % (ref 0.0–14.0)
NEUT#: 5.5 10*3/uL (ref 1.5–6.5)
NEUT%: 86.7 % — ABNORMAL HIGH (ref 39.0–75.0)
Platelets: 282 10*3/uL (ref 140–400)
RBC: 3.76 10*6/uL — ABNORMAL LOW (ref 4.20–5.82)
RDW: 15.3 % — ABNORMAL HIGH (ref 11.0–14.6)
WBC: 6.4 10*3/uL (ref 4.0–10.3)
lymph#: 0.3 10*3/uL — ABNORMAL LOW (ref 0.9–3.3)

## 2017-01-09 LAB — COMPREHENSIVE METABOLIC PANEL
ALT: 36 U/L (ref 0–55)
AST: 25 U/L (ref 5–34)
Albumin: 3.8 g/dL (ref 3.5–5.0)
Alkaline Phosphatase: 31 U/L — ABNORMAL LOW (ref 40–150)
Anion Gap: 7 mEq/L (ref 3–11)
BUN: 15.1 mg/dL (ref 7.0–26.0)
CO2: 26 mEq/L (ref 22–29)
Calcium: 9.3 mg/dL (ref 8.4–10.4)
Chloride: 106 mEq/L (ref 98–109)
Creatinine: 0.9 mg/dL (ref 0.7–1.3)
EGFR: >60 mL/min/{1.73_m2} (ref >60)
Glucose: 82 mg/dl (ref 70–140)
Potassium: 4.1 mEq/L (ref 3.5–5.1)
Sodium: 139 mEq/L (ref 136–145)
Total Bilirubin: 0.46 mg/dL (ref 0.20–1.20)
Total Protein: 6.3 g/dL — ABNORMAL LOW (ref 6.4–8.3)

## 2017-01-09 MED ORDER — PROCHLORPERAZINE MALEATE 10 MG PO TABS: 10.0000 mg | ORAL_TABLET | Freq: Once | ORAL | Status: DC

## 2017-01-09 MED ORDER — PROCHLORPERAZINE MALEATE 10 MG PO TABS
ORAL_TABLET | ORAL | Status: AC
  Filled 2017-01-09: qty 1

## 2017-01-09 MED ORDER — BORTEZOMIB CHEMO SQ INJECTION 3.5 MG (2.5MG/ML)
1.5000 mg/m2 | Freq: Once | INTRAMUSCULAR | Status: AC
  Administered 2017-01-09: 3.25 mg via SUBCUTANEOUS
  Filled 2017-01-09: qty 3.25

## 2017-01-09 MED ORDER — GABAPENTIN 300 MG PO CAPS: 300.0000 mg | ORAL_CAPSULE | Freq: Three times a day (TID) | ORAL | 3 refills | Status: DC

## 2017-01-09 MED FILL — CYCLOPHOSPHAMIDE 50 MG CAPS: 50 | 28 days supply | Qty: 48 | Fill #3

## 2017-01-09 MED FILL — DEXAMETHASONE 4 MG TABLET: 4 | 84 days supply | Qty: 60 | Fill #1

## 2017-01-16 ENCOUNTER — Other Ambulatory Visit (HOSPITAL_BASED_OUTPATIENT_CLINIC_OR_DEPARTMENT_OTHER): Payer: Medicare Other

## 2017-01-16 ENCOUNTER — Ambulatory Visit (HOSPITAL_BASED_OUTPATIENT_CLINIC_OR_DEPARTMENT_OTHER): Payer: Medicare Other

## 2017-01-16 VITALS — BP 136/79 | HR 75 | Temp 97.9°F | Resp 18 | Wt 200.0 lb

## 2017-01-16 DIAGNOSIS — Z5112 Encounter for antineoplastic immunotherapy: Secondary | ICD-10-CM | POA: Diagnosis present

## 2017-01-16 DIAGNOSIS — C9000 Multiple myeloma not having achieved remission: Secondary | ICD-10-CM

## 2017-01-16 LAB — CBC WITH DIFFERENTIAL/PLATELET
BASO%: 0.2 % (ref 0.0–2.0)
Basophils Absolute: 0 10*3/uL (ref 0.0–0.1)
EOS%: 3.6 % (ref 0.0–7.0)
Eosinophils Absolute: 0.2 10*3/uL (ref 0.0–0.5)
HCT: 35.8 % — ABNORMAL LOW (ref 38.4–49.9)
HGB: 11.8 g/dL — ABNORMAL LOW (ref 13.0–17.1)
LYMPH%: 11.8 % — ABNORMAL LOW (ref 14.0–49.0)
MCH: 31.6 pg (ref 27.2–33.4)
MCHC: 33 g/dL (ref 32.0–36.0)
MCV: 96 fL (ref 79.3–98.0)
MONO#: 0.6 10*3/uL (ref 0.1–0.9)
MONO%: 11.2 % (ref 0.0–14.0)
NEUT#: 3.9 10*3/uL (ref 1.5–6.5)
NEUT%: 73.2 % (ref 39.0–75.0)
Platelets: 258 10*3/uL (ref 140–400)
RBC: 3.73 10*6/uL — ABNORMAL LOW (ref 4.20–5.82)
RDW: 15.3 % — ABNORMAL HIGH (ref 11.0–14.6)
WBC: 5.3 10*3/uL (ref 4.0–10.3)
lymph#: 0.6 10*3/uL — ABNORMAL LOW (ref 0.9–3.3)

## 2017-01-16 LAB — COMPREHENSIVE METABOLIC PANEL
ALT: 31 U/L (ref 0–55)
AST: 27 U/L (ref 5–34)
Albumin: 3.7 g/dL (ref 3.5–5.0)
Alkaline Phosphatase: 30 U/L — ABNORMAL LOW (ref 40–150)
Anion Gap: 10 mEq/L (ref 3–11)
BUN: 15.8 mg/dL (ref 7.0–26.0)
CO2: 22 mEq/L (ref 22–29)
Calcium: 9.2 mg/dL (ref 8.4–10.4)
Chloride: 105 mEq/L (ref 98–109)
Creatinine: 0.8 mg/dL (ref 0.7–1.3)
EGFR: >60 mL/min/{1.73_m2} (ref >60)
Glucose: 97 mg/dl (ref 70–140)
Potassium: 4 mEq/L (ref 3.5–5.1)
Sodium: 137 mEq/L (ref 136–145)
Total Bilirubin: 0.41 mg/dL (ref 0.20–1.20)
Total Protein: 6 g/dL — ABNORMAL LOW (ref 6.4–8.3)

## 2017-01-16 MED ORDER — BORTEZOMIB CHEMO SQ INJECTION 3.5 MG (2.5MG/ML)
1.5000 mg/m2 | Freq: Once | INTRAMUSCULAR | Status: AC
  Administered 2017-01-16: 3.25 mg via SUBCUTANEOUS
  Filled 2017-01-16: qty 3.25

## 2017-01-16 MED ORDER — PROCHLORPERAZINE MALEATE 10 MG PO TABS
10.0000 mg | ORAL_TABLET | Freq: Once | ORAL | Status: AC
  Administered 2017-01-16: 10 mg via ORAL

## 2017-01-16 MED ORDER — PROCHLORPERAZINE MALEATE 10 MG PO TABS
ORAL_TABLET | ORAL | Status: AC
  Filled 2017-01-16: qty 1

## 2017-01-16 NOTE — Patient Instructions (Signed)
[facility] Discharge Instructions for Patients Receiving Chemotherapy  Today you received the following chemotherapy agents Velcade.  To help prevent nausea and vomiting after your treatment, we encourage you to take your nausea medication as directed.  If you develop nausea and vomiting that is not controlled by your nausea medication, call the clinic.   BELOW ARE SYMPTOMS THAT SHOULD BE REPORTED IMMEDIATELY:  *FEVER GREATER THAN 100.5 F  *CHILLS WITH OR WITHOUT FEVER  NAUSEA AND VOMITING THAT IS NOT CONTROLLED WITH YOUR NAUSEA MEDICATION  *UNUSUAL SHORTNESS OF BREATH  *UNUSUAL BRUISING OR BLEEDING  TENDERNESS IN MOUTH AND THROAT WITH OR WITHOUT PRESENCE OF ULCERS  *URINARY PROBLEMS  *BOWEL PROBLEMS  UNUSUAL RASH Items with * indicate a potential emergency and should be followed up as soon as possible.  Feel free to call the clinic should you have any questions or concerns. The clinic phone number is [phone].  Please show the CHEMO ALERT CARD at check-in to the Emergency Department and triage nurse.   

## 2017-01-19 DIAGNOSIS — C9000 Multiple myeloma not having achieved remission: Secondary | ICD-10-CM | POA: Diagnosis not present

## 2017-01-19 DIAGNOSIS — K0889 Other specified disorders of teeth and supporting structures: Secondary | ICD-10-CM | POA: Diagnosis not present

## 2017-01-19 LAB — KAPPA/LAMBDA LIGHT CHAINS
Ig Kappa Free Light Chain: 11.1 mg/L (ref 3.3–19.4)
Ig Lambda Free Light Chain: 6.2 mg/L (ref 5.7–26.3)
Kappa/Lambda FluidC Ratio: 1.79 — ABNORMAL HIGH (ref 0.26–1.65)

## 2017-01-20 ENCOUNTER — Telehealth: Payer: Self-pay | Admitting: Oncology

## 2017-01-20 ENCOUNTER — Telehealth: Payer: Self-pay | Admitting: *Deleted

## 2017-01-20 DIAGNOSIS — D638 Anemia in other chronic diseases classified elsewhere: Secondary | ICD-10-CM | POA: Diagnosis not present

## 2017-01-20 DIAGNOSIS — G620 Drug-induced polyneuropathy: Secondary | ICD-10-CM | POA: Diagnosis not present

## 2017-01-20 DIAGNOSIS — Z8579 Personal history of other malignant neoplasms of lymphoid, hematopoietic and related tissues: Secondary | ICD-10-CM | POA: Diagnosis not present

## 2017-01-20 DIAGNOSIS — M545 Low back pain: Secondary | ICD-10-CM | POA: Diagnosis not present

## 2017-01-20 NOTE — Telephone Encounter (Signed)
Returned patient's call re: recent appt at [facility] for stem cell transplant. Lm to call me back to clarify medication changes requested.

## 2017-01-20 NOTE — Telephone Encounter (Signed)
Scheduled appt per 12/18 sch msg - left voicemail for patient regarding appt that was added.

## 2017-01-21 LAB — MULTIPLE MYELOMA PANEL, SERUM
Albumin SerPl Elph-Mcnc: 3.4 g/dL (ref 2.9–4.4)
Albumin/Glob SerPl: 1.5 (ref 0.7–1.7)
Alpha 1: 0.2 g/dL (ref 0.0–0.4)
Alpha2 Glob SerPl Elph-Mcnc: 0.6 g/dL (ref 0.4–1.0)
B-Globulin SerPl Elph-Mcnc: 0.9 g/dL (ref 0.7–1.3)
Gamma Glob SerPl Elph-Mcnc: 0.5 g/dL (ref 0.4–1.8)
Globulin, Total: 2.3 g/dL (ref 2.2–3.9)
IgA, Qn, Serum: 57 mg/dL — ABNORMAL LOW (ref 61–437)
IgG, Qn, Serum: 621 mg/dL — ABNORMAL LOW (ref 700–1600)
IgM, Qn, Serum: 15 mg/dL — ABNORMAL LOW (ref 20–172)
Total Protein: 5.7 g/dL — ABNORMAL LOW (ref 6.0–8.5)

## 2017-01-22 ENCOUNTER — Ambulatory Visit (HOSPITAL_BASED_OUTPATIENT_CLINIC_OR_DEPARTMENT_OTHER): Payer: Medicare Other | Admitting: Oncology

## 2017-01-22 ENCOUNTER — Telehealth: Payer: Self-pay | Admitting: Oncology

## 2017-01-22 VITALS — BP 147/85 | HR 83 | Temp 97.8°F | Resp 18 | Ht 72.25 in | Wt 200.5 lb

## 2017-01-22 DIAGNOSIS — C9000 Multiple myeloma not having achieved remission: Secondary | ICD-10-CM | POA: Diagnosis not present

## 2017-01-22 DIAGNOSIS — D649 Anemia, unspecified: Secondary | ICD-10-CM | POA: Diagnosis not present

## 2017-01-22 DIAGNOSIS — G629 Polyneuropathy, unspecified: Secondary | ICD-10-CM | POA: Diagnosis not present

## 2017-01-22 MED ORDER — LENALIDOMIDE 25 MG PO CAPS
ORAL_CAPSULE | ORAL | 0 refills | Status: DC
Start: 2017-01-22 — End: 2017-01-29

## 2017-01-22 NOTE — Telephone Encounter (Signed)
Gave avs and calendar for January  °

## 2017-01-22 NOTE — Progress Notes (Signed)
Hematology and Oncology Follow Up Visit  Alexander Saunders [id number] 1950/12/28 66 y.o. 01/22/2017 9:18 AM Alexander Saunders MDOsei-Bonsu, [name], MD   Principle Diagnosis: 66 year old gentleman diagnosed with multiple myeloma in July 2018. 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 His workup revealed diffuse bony involvement, 10% plasma myeloma in the bone marrow. He had a 0.1 g/dL M spike but no heavy chain immunofixation noted. His kappa light chain was elevated with an increased ratio of 4.25. His cytogenetics showed + 11 cytogenetic abnormality.    Prior Therapy:  He is status post surgical decompression of an epidural mass on 09/04/2016 and the pathology showed a plasma cell neoplasm.  Bone marrow biopsy obtained on 10/02/2016 showed 10% plasma cell involvement.  He is S/P adjuvant radiation therapy to the thoracic spine to be completed on 10/14/2016.  Current therapy:   He is currently receiving Velcade 1.5 mg/m weekly with dexamethasone 20 mg and Cytoxan 600 mg po. Therapy started on 10/24/2016. Therapy to be switched to Revlimid 25 mg total dose daily for 14 days on 1 week off starting on January 30, 2017 for at least 2 cycles.  This will be given in combination of Velcade and dexamethasone.   Interim History:  Alexander Saunders presents today for a follow-up visit. This last visit, he reports no major changes in his health.  He was evaluated by Dr. Norma Fredrickson at [facility] for potential stem cell transplant.  It was recommended that switch his Cytoxan therapy to Revlimid.  He continues to ambulate without any major difficulties although he had a fall while walking in the snow.   He denied any nausea, vomiting, neuropathy or complications.  He denies any excessive fatigue or tiredness.  He continues to attend activities of daily living without any decline.  His mobility is still limited because of neuropathy  and slight weakness in his lower extremities.     He does not report any headaches, blurry vision, syncope or seizures. He does not report any fevers, chills, sweats or weight loss. He does not report any chest pain, palpitation, orthopnea or leg edema. He does not report any cough, wheezing or hemoptysis. He does not report any nausea, vomiting, abdominal pain, constipation, diarrhea or hematochezia. He does not report any frequency, urgency or loss of bladder control. He does not report any lymphadenopathy or petechiae. He does not report any pathological fractures. Remaining review of systems unremarkable  Medications: I have reviewed the patient's current medications.  Current Outpatient Medications  Medication Sig Dispense Refill  . acyclovir (ZOVIRAX) 400 MG tablet Take 1 tablet (400 mg total) by mouth 2 (two) times daily. 60 tablet 3  . cyclophosphamide (CYTOXAN) 50 MG capsule Take 12 caps ('600mg'$ ) by mouth once weekly on day of chemotherapy.Give early in day, 1 hour before or 2 hours after meals. Maintain hydration 48 capsule 3  . dexamethasone (DECADRON) 4 MG tablet Take 5 tablets ('20mg'$ ) by mouth once weekly with breakfast on days of chemotherapy. 60 tablet 3  . gabapentin (NEURONTIN) 300 MG capsule Take 1 capsule (300 mg total) by mouth 3 (three) times daily. 90 capsule 3  . oxyCODONE-acetaminophen (PERCOCET) 5-325 MG tablet Take 1-2 tablets by mouth every 6 (six) hours as needed for severe pain. 15 tablet 0  . polyethylene glycol (MIRALAX / GLYCOLAX) packet Take 17 g by mouth 2 (two) times daily. 14 each 0  . predniSONE (DELTASONE) 20 MG tablet 3 tabs po  daily x 3 days starting 10/09/16 9 tablet 0  . prochlorperazine (COMPAZINE) 10 MG tablet Take 1 tablet (10 mg total) by mouth every 6 (six) hours as needed for nausea or vomiting. 30 tablet 0  . senna-docusate (SENOKOT-S) 8.6-50 MG tablet Take 1 tablet by mouth 2 (two) times daily. 60 tablet 0  . lenalidomide (REVLIMID) 25 MG capsule Take  daily for 14 days and take one week off. 14 capsule 0  . methocarbamol (ROBAXIN) 750 MG tablet      No current facility-administered medications for this visit.      Allergies: No Known Allergies  Past Medical History, Surgical history, Social history, and Family History were reviewed and updated.  Physical Exam: Blood pressure (!) 147/85, pulse 83, temperature 97.8 F (36.6 C), temperature source Oral, resp. rate 18, height 6' 0.25" (1.835 m), weight 200 lb 8 oz (90.9 kg), SpO2 100 %. ECOG: 1 General appearance: Well-appearing gentleman appeared without distress. Head: Normocephalic, without obvious abnormality no oral ulcers or lesions. Neck: no adenopathy Lymph nodes: Cervical, supraclavicular, and axillary nodes normal. Heart:regular rate and rhythm, S1, S2 normal, no murmur, click, rub or gallop Lung:chest clear, no wheezing, rales, normal symmetric air entry Abdomin: soft, non-tender, without masses or organomegaly no rebound or guarding. EXT:no erythema, induration, or nodules   Lab Results: Lab Results  Component Value Date   WBC 5.3 01/16/2017   HGB 11.8 (L) 01/16/2017   HCT 35.8 (L) 01/16/2017   MCV 96.0 01/16/2017   PLT 258 01/16/2017     Chemistry      Component Value Date/Time   NA 137 01/16/2017 0815   K 4.0 01/16/2017 0815   CL 104 10/08/2016 1304   CO2 22 01/16/2017 0815   BUN 15.8 01/16/2017 0815   CREATININE 0.8 01/16/2017 0815      Component Value Date/Time   CALCIUM 9.2 01/16/2017 0815   ALKPHOS 30 (L) 01/16/2017 0815   AST 27 01/16/2017 0815   ALT 31 01/16/2017 0815   BILITOT 0.41 01/16/2017 0815     Results for Alexander Saunders (MRN [id number]) as of 01/22/2017 09:21  Ref. Range 12/12/2016 08:17 01/16/2017 08:15  M Protein SerPl Elph-Mcnc Latest Ref Range: Not Observed g/dL Not Observed Not Observed   Results for Alexander Saunders (MRN [id number]) as of 01/22/2017 09:21  Ref. Range 01/16/2017 08:15  Ig Kappa Free Light Chain Latest Ref  Range: 3.3 - 19.4 mg/L 11.1  Ig Lambda Free Light Chain Latest Ref Range: 5.7 - 26.3 mg/L 6.2  Kappa/Lambda FluidC Ratio Latest Ref Range: 0.26 - 1.65  1.79 (H)     Impression and Plan:  66 year old gentleman with the following issues:  1. Plasma cell neoplasm diagnosed and July 2018. He presented with lower extremity weakness and found to have a epidural mass around T4-T6 that required urgent surgical decompression on 09/04/2016. The follow-up pathology confirmed the presence of a plasma cell neoplasm. CT scan of the chest abdomen and pelvis showed no solid tumor malignancy but diffuse bone involvement including a 2.8 cm in the posterior aspect of the right acetabulum.  Bone marrow biopsy showed 10% plasma cell neoplasm and increased Kappa Light chain in the blood.  He is currently receiving Velcade, dexamethasone and Cytoxan weekly. He tolerated therapy well without complications.  Protein studies obtained on 01/16/2017 showed continued improvement in his kappa to lambda ratio and his M spike is no longer observed.  He was evaluated by Dr. Norma Fredrickson for potential stem cell transplant and felt  that he would be an excellent candidate.  He recommended switching his regimen to Revlimid, Velcade and dexamethasone.  He received 2 cycles of this therapy before proceeding with stem cell transplant.  Complication associated with adding Revlimid was discussed today.  These complications would include nausea, fatigue, constipation, neuropathy as well as cytopenias.  He will receive 25 mg total dose for 14 days on and one-week off.  He will continue with weekly Velcade and dexamethasone.  His Revlimid will start on 01/30/2017.   2. Epidural tumor compression: He status post surgical decompression accomplished on 09/04/2016. He is completed radiation therapy adjuvantly on 10/14/2016.  3. Bone health: He received the first of Zometa on 09/26/2016 and will be repeated monthly.  Zometa will be held this  month because of dental work.  4. Anemia: His hemoglobin is improved without any need for transfusion.  7. Prognosis: He remains an excellent performance status and this is a treatable malignancy.   8.  Peripheral neuropathy: Related to surgery and less likely related to Velcade.  9. Follow-up: Will be weekly for treatment and on February 06, 2017 to follow his progress.      Zola Button, MD 12/20/20189:18 AM

## 2017-01-23 ENCOUNTER — Other Ambulatory Visit (HOSPITAL_BASED_OUTPATIENT_CLINIC_OR_DEPARTMENT_OTHER): Payer: Medicare Other

## 2017-01-23 ENCOUNTER — Ambulatory Visit (HOSPITAL_BASED_OUTPATIENT_CLINIC_OR_DEPARTMENT_OTHER): Payer: Medicare Other

## 2017-01-23 VITALS — BP 123/69 | HR 81 | Temp 98.2°F | Resp 18

## 2017-01-23 DIAGNOSIS — Z5112 Encounter for antineoplastic immunotherapy: Secondary | ICD-10-CM | POA: Diagnosis present

## 2017-01-23 DIAGNOSIS — C9000 Multiple myeloma not having achieved remission: Secondary | ICD-10-CM

## 2017-01-23 LAB — COMPREHENSIVE METABOLIC PANEL
ALT: 28 U/L (ref 0–55)
AST: 23 U/L (ref 5–34)
Albumin: 3.7 g/dL (ref 3.5–5.0)
Alkaline Phosphatase: 28 U/L — ABNORMAL LOW (ref 40–150)
Anion Gap: 9 mEq/L (ref 3–11)
BUN: 12.9 mg/dL (ref 7.0–26.0)
CO2: 21 mEq/L — ABNORMAL LOW (ref 22–29)
Calcium: 8.7 mg/dL (ref 8.4–10.4)
Chloride: 107 mEq/L (ref 98–109)
Creatinine: 0.9 mg/dL (ref 0.7–1.3)
EGFR: >60 mL/min/{1.73_m2} (ref >60)
Glucose: 100 mg/dl (ref 70–140)
Potassium: 3.9 mEq/L (ref 3.5–5.1)
Sodium: 138 mEq/L (ref 136–145)
Total Bilirubin: 0.31 mg/dL (ref 0.20–1.20)
Total Protein: 5.8 g/dL — ABNORMAL LOW (ref 6.4–8.3)

## 2017-01-23 LAB — CBC WITH DIFFERENTIAL/PLATELET
BASO%: 0.4 % (ref 0.0–2.0)
Basophils Absolute: 0 10*3/uL (ref 0.0–0.1)
EOS%: 3.1 % (ref 0.0–7.0)
Eosinophils Absolute: 0.1 10*3/uL (ref 0.0–0.5)
HCT: 34.4 % — ABNORMAL LOW (ref 38.4–49.9)
HGB: 11.7 g/dL — ABNORMAL LOW (ref 13.0–17.1)
LYMPH%: 10.8 % — ABNORMAL LOW (ref 14.0–49.0)
MCH: 32.8 pg (ref 27.2–33.4)
MCHC: 34 g/dL (ref 32.0–36.0)
MCV: 96.4 fL (ref 79.3–98.0)
MONO#: 0.6 10*3/uL (ref 0.1–0.9)
MONO%: 13 % (ref 0.0–14.0)
NEUT#: 3.3 10*3/uL (ref 1.5–6.5)
NEUT%: 72.7 % (ref 39.0–75.0)
Platelets: 245 10*3/uL (ref 140–400)
RBC: 3.57 10*6/uL — ABNORMAL LOW (ref 4.20–5.82)
RDW: 15.1 % — ABNORMAL HIGH (ref 11.0–14.6)
WBC: 4.5 10*3/uL (ref 4.0–10.3)
lymph#: 0.5 10*3/uL — ABNORMAL LOW (ref 0.9–3.3)

## 2017-01-23 MED ORDER — BORTEZOMIB CHEMO SQ INJECTION 3.5 MG (2.5MG/ML)
1.5000 mg/m2 | Freq: Once | INTRAMUSCULAR | Status: AC
  Administered 2017-01-23: 3.25 mg via SUBCUTANEOUS
  Filled 2017-01-23: qty 3.25

## 2017-01-23 MED ORDER — PROCHLORPERAZINE MALEATE 10 MG PO TABS: 10.0000 mg | ORAL_TABLET | Freq: Once | ORAL | Status: DC

## 2017-01-23 NOTE — Patient Instructions (Signed)
[facility] Discharge Instructions for Patients Receiving Chemotherapy  Today you received the following chemotherapy agents Velcade.  To help prevent nausea and vomiting after your treatment, we encourage you to take your nausea medication as directed.  If you develop nausea and vomiting that is not controlled by your nausea medication, call the clinic.   BELOW ARE SYMPTOMS THAT SHOULD BE REPORTED IMMEDIATELY:  *FEVER GREATER THAN 100.5 F  *CHILLS WITH OR WITHOUT FEVER  NAUSEA AND VOMITING THAT IS NOT CONTROLLED WITH YOUR NAUSEA MEDICATION  *UNUSUAL SHORTNESS OF BREATH  *UNUSUAL BRUISING OR BLEEDING  TENDERNESS IN MOUTH AND THROAT WITH OR WITHOUT PRESENCE OF ULCERS  *URINARY PROBLEMS  *BOWEL PROBLEMS  UNUSUAL RASH Items with * indicate a potential emergency and should be followed up as soon as possible.  Feel free to call the clinic should you have any questions or concerns. The clinic phone number is [phone].  Please show the CHEMO ALERT CARD at check-in to the Emergency Department and triage nurse.   

## 2017-01-26 ENCOUNTER — Other Ambulatory Visit: Payer: Self-pay | Admitting: Medical

## 2017-01-26 DIAGNOSIS — H1033 Unspecified acute conjunctivitis, bilateral: Secondary | ICD-10-CM

## 2017-01-26 MED ORDER — GENTAMICIN SULFATE 0.3 % OP SOLN: 1.0000 [drp] | Freq: Four times a day (QID) | OPHTHALMIC | 0 refills | Status: DC

## 2017-01-29 ENCOUNTER — Telehealth: Payer: Self-pay | Admitting: Pharmacist

## 2017-01-29 DIAGNOSIS — C9000 Multiple myeloma not having achieved remission: Secondary | ICD-10-CM

## 2017-01-29 MED ORDER — LENALIDOMIDE 25 MG PO CAPS: ORAL_CAPSULE | ORAL | 0 refills | Status: DC

## 2017-01-29 NOTE — Telephone Encounter (Signed)
Oral Oncology Pharmacist Encounter  Called [facility] to follow-up on Revlimid status.  Revlimid requires prior authorization.  PA submitted on CoverMyMeds Key [id number] Authorization was approved. Effective dates 01/29/17 - 02/02/2098  Oral Oncology Clinic will continue to follow  Johny Drilling, PharmD, BCPS, BCOP 01/29/2017 2:49 PM Oral Oncology Clinic [phone]

## 2017-01-29 NOTE — Telephone Encounter (Signed)
Oral Oncology Pharmacist Encounter  Received new prescription for Revlimid (lenalidomide) for the treatment of multiple myeloma not having achieved remission in conjunction with Velcade and dexamethasone, planned duration 2 cycles and then reassess for stem cell transplant at [facility]. Patient s/p CyBorD x 3 cycles (renal dysfunction at diagnosis), now to receive RVd x 2 cycles at the recommendation of Dr. Norma Fredrickson in anticipation of transplant.  Labs from 01/23/17 assessed, OK for treatment. SCr=0.9, round to 1.0 for age>65, est CrCl~90 mL/min Revlimid dosed at 72m daily, 14 days on, 7 days off Velcade will remain at 1.5 mg/m2 Qweek Dexamethassone will remain at 267mQweek  Current medication list in Epic reviewed, no DDIs with Revlimid identified. Cyclophosphamide removed from medication list.  [place] [facility] [place] ph: [phone] benefits analysis and approval as Revlimid is a limited distribution medication and patient has [employer].  Oral Oncology Clinic will continue to follow for insurance authorization, copayment issues, initial counseling and start date.  JeJohny DrillingPharmD, BCPS, BCOP 01/29/2017 9:52 AM Oral Oncology Clinic [phone]

## 2017-01-29 NOTE — Telephone Encounter (Signed)
Oral Chemotherapy Pharmacist Encounter   I spoke with patient for overview of: Revlimid.   Pt is doing well. Counseled patient on administration, dosing, side effects, monitoring, drug-food interactions, safe handling, storage, and disposal.  Patient will take Revlimid 25mg  capsules, 1 capsule by mouth once daily, without regard to food, with a full glass of water. Revlimid will be given 14 days on, 7 days off, repeat every 21 days. Patient will take dexamethasone 4mg  tablets, 5 tablets (20mg ) by mouth once weekly with breakfast. Revlimid start date: 01/30/17 or 01/31/17  Side effects of Revlimid include but not limited to: nausea, constipation, diarrhea, abdominal pain, rash, fatigue, drug fever, and decreased blood counts.    Reviewed with patient importance of keeping a medication schedule and plan for any missed doses.  Mr. Ezzell voiced understanding and appreciation.   All questions answered. Medication reconciliation performed and medication/allergy list updated.  Patient remains on acyclovir and dexamethasone. Patient counseled on importance of daily aspirin 81mg  for VTE prophylaxis. He will start aspirin tomorrow (01/30/17)  Patient given phone number to [facility] dept at [place], he should expect call today for shipment scheduling for delivery tomorrow (01/30/17) He will start his Revlimid if the medication arrives to him by 2pm. He will wait until 12/29 start if medication arrives later in the day. Confirmed appts for 01/30/17 with patient.  Patient knows to call the office with questions or concerns. Oral Oncology Clinic will continue to follow.  Johny Drilling, PharmD, BCPS, BCOP 01/29/2017    10:27 AM Oral Oncology Clinic [phone]

## 2017-01-30 ENCOUNTER — Ambulatory Visit (HOSPITAL_BASED_OUTPATIENT_CLINIC_OR_DEPARTMENT_OTHER): Payer: Medicare Other

## 2017-01-30 ENCOUNTER — Ambulatory Visit (HOSPITAL_BASED_OUTPATIENT_CLINIC_OR_DEPARTMENT_OTHER): Payer: Medicare Other | Admitting: Oncology

## 2017-01-30 ENCOUNTER — Other Ambulatory Visit (HOSPITAL_BASED_OUTPATIENT_CLINIC_OR_DEPARTMENT_OTHER): Payer: Medicare Other

## 2017-01-30 VITALS — BP 138/82 | HR 78 | Temp 98.6°F | Resp 18 | Ht 72.25 in | Wt 205.0 lb

## 2017-01-30 DIAGNOSIS — D649 Anemia, unspecified: Secondary | ICD-10-CM | POA: Diagnosis not present

## 2017-01-30 DIAGNOSIS — G6289 Other specified polyneuropathies: Secondary | ICD-10-CM

## 2017-01-30 DIAGNOSIS — C9000 Multiple myeloma not having achieved remission: Secondary | ICD-10-CM

## 2017-01-30 DIAGNOSIS — Z5112 Encounter for antineoplastic immunotherapy: Secondary | ICD-10-CM

## 2017-01-30 LAB — COMPREHENSIVE METABOLIC PANEL
ALT: 37 U/L (ref 0–55)
AST: 26 U/L (ref 5–34)
Albumin: 3.7 g/dL (ref 3.5–5.0)
Alkaline Phosphatase: 29 U/L — ABNORMAL LOW (ref 40–150)
Anion Gap: 8 mEq/L (ref 3–11)
BUN: 15 mg/dL (ref 7.0–26.0)
CO2: 23 mEq/L (ref 22–29)
Calcium: 9.4 mg/dL (ref 8.4–10.4)
Chloride: 108 mEq/L (ref 98–109)
Creatinine: 0.8 mg/dL (ref 0.7–1.3)
EGFR: >60 mL/min/{1.73_m2} (ref >60)
Glucose: 117 mg/dl (ref 70–140)
Potassium: 3.9 mEq/L (ref 3.5–5.1)
Sodium: 139 mEq/L (ref 136–145)
Total Bilirubin: 0.36 mg/dL (ref 0.20–1.20)
Total Protein: 6.2 g/dL — ABNORMAL LOW (ref 6.4–8.3)

## 2017-01-30 LAB — CBC WITH DIFFERENTIAL/PLATELET
BASO%: 0.6 % (ref 0.0–2.0)
Basophils Absolute: 0 10*3/uL (ref 0.0–0.1)
EOS%: 3.1 % (ref 0.0–7.0)
Eosinophils Absolute: 0.2 10*3/uL (ref 0.0–0.5)
HCT: 35.3 % — ABNORMAL LOW (ref 38.4–49.9)
HGB: 12.1 g/dL — ABNORMAL LOW (ref 13.0–17.1)
LYMPH%: 10.6 % — ABNORMAL LOW (ref 14.0–49.0)
MCH: 33.3 pg (ref 27.2–33.4)
MCHC: 34.3 g/dL (ref 32.0–36.0)
MCV: 97.1 fL (ref 79.3–98.0)
MONO#: 0.7 10*3/uL (ref 0.1–0.9)
MONO%: 14.3 % — ABNORMAL HIGH (ref 0.0–14.0)
NEUT#: 3.6 10*3/uL (ref 1.5–6.5)
NEUT%: 71.4 % (ref 39.0–75.0)
Platelets: 259 10*3/uL (ref 140–400)
RBC: 3.63 10*6/uL — ABNORMAL LOW (ref 4.20–5.82)
RDW: 16 % — ABNORMAL HIGH (ref 11.0–14.6)
WBC: 5.1 10*3/uL (ref 4.0–10.3)
lymph#: 0.5 10*3/uL — ABNORMAL LOW (ref 0.9–3.3)

## 2017-01-30 MED ORDER — BORTEZOMIB CHEMO SQ INJECTION 3.5 MG (2.5MG/ML)
1.5000 mg/m2 | Freq: Once | INTRAMUSCULAR | Status: AC
  Administered 2017-01-30: 3.25 mg via SUBCUTANEOUS
  Filled 2017-01-30: qty 3.25

## 2017-01-30 MED ORDER — PROCHLORPERAZINE MALEATE 10 MG PO TABS: 10.0000 mg | ORAL_TABLET | Freq: Once | ORAL | Status: DC

## 2017-01-30 NOTE — Telephone Encounter (Signed)
Oral Oncology Pharmacist Encounter  Patient provided updated phone number to [place] [phone]). Patient will call pharmacy now to schedule his 1st shipment.  Oral Oncology Clinic will continue to follow.  Johny Drilling, PharmD, BCPS, BCOP 01/30/2017 3:50 PM Oral Oncology Clinic [phone]

## 2017-01-30 NOTE — Progress Notes (Signed)
Hematology and Oncology Follow Up Visit  Alexander Saunders [id number] Jun 29, 1950 66 y.o. 01/30/2017 9:06 AM Alexander Saunders MDOsei-Bonsu, [name], MD   Principle Diagnosis: 65 year old gentleman diagnosed with multiple myeloma in July 2018. 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 His workup revealed diffuse bony involvement, 10% plasma myeloma in the bone marrow. He had a 0.1 g/dL M spike but no heavy chain immunofixation noted. His kappa light chain was elevated with an increased ratio of 4.25. His cytogenetics showed + 11 cytogenetic abnormality.    Prior Therapy:  He is status post surgical decompression of an epidural mass on 09/04/2016 and the pathology showed a plasma cell neoplasm.  Bone marrow biopsy obtained on 10/02/2016 showed 10% plasma cell involvement.  He is S/P adjuvant radiation therapy to the thoracic spine to be completed on 10/14/2016.  Velcade 1.5 mg/m weekly with dexamethasone 20 mg and Cytoxan 600 mg po. Therapy started on 10/24/2016.  Current therapy:   Therapy switched to Revlimid 25 mg total dose daily for 14 days on 1 week off starting on January 30, 2017 for at least 2 cycles.  This will be given in combination of Velcade and dexamethasone at the same previous doses.   Interim History:  Alexander Saunders presents today for a follow-up visit.  He reports feeling well overall without any recent complaints.  He received all the information about switching to Revlimid and he will start Revlimid in the next 24 hours.  He is also to start aspirin as well.  He discontinued Cytoxan and his last dose was on 01/23/2017.  He continues to tolerate Velcade and dexamethasone without any complications.  He is able to ambulate without any major difficulties.  He has neuropathic pain is slightly increased.  And he is instructed to increase Neurontin to 600 mg at nighttime with 300 mg in the morning and in the afternoon.  He  does report somnolence associated with Neurontin and for that reason he takes it at nighttime at a higher dose.   He does not report any headaches, blurry vision, syncope or seizures. He does not report any fevers, chills, sweats or weight loss. He does not report any chest pain, palpitation, orthopnea or leg edema. He does not report any cough, wheezing or hemoptysis. He does not report any nausea, vomiting, abdominal pain, constipation, diarrhea or hematochezia. He does not report any frequency, urgency or loss of bladder control. He does not report any lymphadenopathy or petechiae. He does not report any pathological fractures. Remaining review of systems unremarkable  Medications: I have reviewed the patient's current medications.  Current Outpatient Medications  Medication Sig Dispense Refill  . acyclovir (ZOVIRAX) 400 MG tablet Take 1 tablet (400 mg total) by mouth 2 (two) times daily. 60 tablet 3  . aspirin EC 81 MG tablet Take 81 mg by mouth daily.    [name] dexamethasone (DECADRON) 4 MG tablet Take 5 tablets ('20mg'$ ) by mouth once weekly with breakfast on days of chemotherapy. 60 tablet 3  . gabapentin (NEURONTIN) 300 MG capsule Take 1 capsule (300 mg total) by mouth 3 (three) times daily. 90 capsule 3  . gentamicin (GARAMYCIN) 0.3 % ophthalmic solution Place 1 drop into both eyes 4 (four) times daily. 5 mL 0  . lenalidomide (REVLIMID) 25 MG capsule Take '25mg'$  by mouth once daily for 14 days on, 7 days off, repeat every 21 days 14 capsule 0  . methocarbamol (ROBAXIN) 750 MG tablet     .  oxyCODONE-acetaminophen (PERCOCET) 5-325 MG tablet Take 1-2 tablets by mouth every 6 (six) hours as needed for severe pain. 15 tablet 0  . polyethylene glycol (MIRALAX / GLYCOLAX) packet Take 17 g by mouth 2 (two) times daily. 14 each 0  . prochlorperazine (COMPAZINE) 10 MG tablet Take 1 tablet (10 mg total) by mouth every 6 (six) hours as needed for nausea or vomiting. 30 tablet 0  . senna-docusate (SENOKOT-S)  8.6-50 MG tablet Take 1 tablet by mouth 2 (two) times daily. 60 tablet 0   No current facility-administered medications for this visit.      Allergies: No Known Allergies  Past Medical History, Surgical history, Social history, and Family History were reviewed and updated.  Physical Exam: Blood pressure 138/82, pulse 78, temperature 98.6 F (37 C), temperature source Oral, resp. rate 18, height 6' 0.25" (1.835 m), weight 205 lb (93 kg), SpO2 100 %. ECOG: 1 General appearance: Alert, awake gentleman without distress. Head: Normocephalic, without obvious abnormality no oral ulcers or lesions. Neck: no adenopathy Lymph nodes: Cervical, supraclavicular, and axillary nodes normal. Heart:regular rate and rhythm, S1, S2 normal, no murmur, click, rub or gallop Lung:chest clear, no wheezing, rales, normal symmetric air entry Abdomin: soft, non-tender, without masses or organomegaly no shifting dullness or ascites. EXT:no erythema, induration, or nodules   Lab Results: Lab Results  Component Value Date   WBC 5.1 01/30/2017   HGB 12.1 (L) 01/30/2017   HCT 35.3 (L) 01/30/2017   MCV 97.1 01/30/2017   PLT 259 01/30/2017     Chemistry      Component Value Date/Time   NA 139 01/30/2017 0822   K 3.9 01/30/2017 0822   CL 104 10/08/2016 1304   CO2 23 01/30/2017 0822   BUN 15.0 01/30/2017 0822   CREATININE 0.8 01/30/2017 0822      Component Value Date/Time   CALCIUM 9.4 01/30/2017 0822   ALKPHOS 29 (L) 01/30/2017 0822   AST 26 01/30/2017 0822   ALT 37 01/30/2017 0822   BILITOT 0.36 01/30/2017 0822      Impression and Plan:  66 year old gentleman with the following issues:  1. Plasma cell neoplasm diagnosed and July 2018. He presented with lower extremity weakness and found to have a epidural mass around T4-T6 that required urgent surgical decompression on 09/04/2016. The follow-up pathology confirmed the presence of a plasma cell neoplasm. CT scan of the chest abdomen and pelvis  showed no solid tumor malignancy but diffuse bone involvement including a 2.8 cm in the posterior aspect of the right acetabulum.  Bone marrow biopsy showed 10% plasma cell neoplasm and increased Kappa Light chain in the blood.  He is currently receiving Velcade, dexamethasone and Cytoxan weekly.  Therapy ended on 01/23/2017.  Protein studies obtained on 01/16/2017 showed continued improvement in his kappa to lambda ratio and his M spike is no longer observed.  He was evaluated by Dr. Norma Fredrickson for potential stem cell transplant and felt that he would be an excellent candidate.    He will start to Revlimid at 25 mg daily for 14 days out of a 21-day cycle starting on 01/30/2017.  He will continue with weekly Velcade and dexamethasone for 2 cycles before evaluation for stem cell transplant.  I reiterated the complication associated with Revlimid including thrombosis, neuropathy, cytopenias among others.  I answered all his questions regarding this medication.    2. Epidural tumor compression: He status post surgical decompression accomplished on 09/04/2016. He is completed radiation therapy adjuvantly on 10/14/2016.  3. Bone health: He received the first of Zometa on 09/26/2016 and will be repeated monthly.  Zometa will be held this month because of dental work.  4. Anemia: His hemoglobin remains close to normal range.  7. Prognosis: He remains an excellent performance status and this is a treatable malignancy.   8.  Peripheral neuropathy: Related to surgery and less likely related to Velcade.  He will increased her Neurontin to 600 mg at nighttime with 300 mg in the morning and 300 mg in the afternoon.  9. Follow-up: Will be weekly for treatment and on February 06, 2017 to follow his progress.      Zola Button, MD 12/28/20189:06 AM

## 2017-01-30 NOTE — Patient Instructions (Signed)
[facility] Discharge Instructions for Patients Receiving Chemotherapy  Today you received the following chemotherapy agents Velcade.  To help prevent nausea and vomiting after your treatment, we encourage you to take your nausea medication as directed.  If you develop nausea and vomiting that is not controlled by your nausea medication, call the clinic.   BELOW ARE SYMPTOMS THAT SHOULD BE REPORTED IMMEDIATELY:  *FEVER GREATER THAN 100.5 F  *CHILLS WITH OR WITHOUT FEVER  NAUSEA AND VOMITING THAT IS NOT CONTROLLED WITH YOUR NAUSEA MEDICATION  *UNUSUAL SHORTNESS OF BREATH  *UNUSUAL BRUISING OR BLEEDING  TENDERNESS IN MOUTH AND THROAT WITH OR WITHOUT PRESENCE OF ULCERS  *URINARY PROBLEMS  *BOWEL PROBLEMS  UNUSUAL RASH Items with * indicate a potential emergency and should be followed up as soon as possible.  Feel free to call the clinic should you have any questions or concerns. The clinic phone number is [phone].  Please show the CHEMO ALERT CARD at check-in to the Emergency Department and triage nurse.   

## 2017-02-05 NOTE — Telephone Encounter (Signed)
Oral Oncology Patient Advocate Encounter  Confirmed with [facility] that the patient's Revlimid has shipped and is scheduled for delivery tomorrow 02/06/2017.    [name]. Melynda Keller, [place] [facility] [phone] 02/05/2017 1:40 PM

## 2017-02-06 ENCOUNTER — Telehealth: Payer: Self-pay | Admitting: Oncology

## 2017-02-06 ENCOUNTER — Ambulatory Visit (HOSPITAL_BASED_OUTPATIENT_CLINIC_OR_DEPARTMENT_OTHER): Payer: Medicare Other

## 2017-02-06 ENCOUNTER — Ambulatory Visit (HOSPITAL_BASED_OUTPATIENT_CLINIC_OR_DEPARTMENT_OTHER): Payer: Medicare Other | Admitting: Oncology

## 2017-02-06 ENCOUNTER — Other Ambulatory Visit (HOSPITAL_BASED_OUTPATIENT_CLINIC_OR_DEPARTMENT_OTHER): Payer: Medicare Other

## 2017-02-06 VITALS — BP 141/83 | HR 80 | Temp 98.7°F | Resp 18 | Ht 72.25 in | Wt 206.8 lb

## 2017-02-06 DIAGNOSIS — C9000 Multiple myeloma not having achieved remission: Secondary | ICD-10-CM

## 2017-02-06 DIAGNOSIS — D649 Anemia, unspecified: Secondary | ICD-10-CM

## 2017-02-06 DIAGNOSIS — Z5112 Encounter for antineoplastic immunotherapy: Secondary | ICD-10-CM

## 2017-02-06 DIAGNOSIS — G629 Polyneuropathy, unspecified: Secondary | ICD-10-CM | POA: Diagnosis not present

## 2017-02-06 LAB — COMPREHENSIVE METABOLIC PANEL
ALT: 43 U/L (ref 0–55)
AST: 32 U/L (ref 5–34)
Albumin: 3.7 g/dL (ref 3.5–5.0)
Alkaline Phosphatase: 31 U/L — ABNORMAL LOW (ref 40–150)
Anion Gap: 7 mEq/L (ref 3–11)
BUN: 14.9 mg/dL (ref 7.0–26.0)
CO2: 24 mEq/L (ref 22–29)
Calcium: 9.1 mg/dL (ref 8.4–10.4)
Chloride: 105 mEq/L (ref 98–109)
Creatinine: 0.9 mg/dL (ref 0.7–1.3)
EGFR: >60 mL/min/{1.73_m2} (ref >60)
Glucose: 111 mg/dl (ref 70–140)
Potassium: 4 mEq/L (ref 3.5–5.1)
Sodium: 137 mEq/L (ref 136–145)
Total Bilirubin: 0.42 mg/dL (ref 0.20–1.20)
Total Protein: 6.1 g/dL — ABNORMAL LOW (ref 6.4–8.3)

## 2017-02-06 LAB — CBC WITH DIFFERENTIAL/PLATELET
BASO%: 0.3 % (ref 0.0–2.0)
Basophils Absolute: 0 10*3/uL (ref 0.0–0.1)
EOS%: 2.5 % (ref 0.0–7.0)
Eosinophils Absolute: 0.2 10*3/uL (ref 0.0–0.5)
HCT: 36.2 % — ABNORMAL LOW (ref 38.4–49.9)
HGB: 12 g/dL — ABNORMAL LOW (ref 13.0–17.1)
LYMPH%: 15.4 % (ref 14.0–49.0)
MCH: 32.4 pg (ref 27.2–33.4)
MCHC: 33.1 g/dL (ref 32.0–36.0)
MCV: 97.8 fL (ref 79.3–98.0)
MONO#: 0.6 10*3/uL (ref 0.1–0.9)
MONO%: 10.3 % (ref 0.0–14.0)
NEUT#: 4.4 10*3/uL (ref 1.5–6.5)
NEUT%: 71.5 % (ref 39.0–75.0)
Platelets: 258 10*3/uL (ref 140–400)
RBC: 3.7 10*6/uL — ABNORMAL LOW (ref 4.20–5.82)
RDW: 15.1 % — ABNORMAL HIGH (ref 11.0–14.6)
WBC: 6.1 10*3/uL (ref 4.0–10.3)
lymph#: 0.9 10*3/uL (ref 0.9–3.3)

## 2017-02-06 MED ORDER — BORTEZOMIB CHEMO SQ INJECTION 3.5 MG (2.5MG/ML)
1.5000 mg/m2 | Freq: Once | INTRAMUSCULAR | Status: AC
  Administered 2017-02-06: 3.25 mg via SUBCUTANEOUS
  Filled 2017-02-06: qty 3.25

## 2017-02-06 MED ORDER — PROCHLORPERAZINE MALEATE 10 MG PO TABS: 10.0000 mg | ORAL_TABLET | Freq: Once | ORAL | Status: DC

## 2017-02-06 MED ORDER — GABAPENTIN 300 MG PO CAPS: ORAL_CAPSULE | ORAL | 3 refills | Status: DC

## 2017-02-06 MED FILL — ACYCLOVIR 400 MG TABLET: 400 | 30 days supply | Qty: 60 | Fill #3

## 2017-02-06 NOTE — Patient Instructions (Signed)

## 2017-02-06 NOTE — Progress Notes (Signed)
Hematology and Oncology Follow Up Visit  GODRIC LAVELL [id number] 06-22-1950 67 y.o. 02/06/2017 9:00 AM [name], Luiz Blare, [name], MD   Principle Diagnosis: 67 year old gentleman diagnosed with multiple myeloma in July 2018. 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 His workup revealed diffuse bony involvement, 10% plasma myeloma in the bone marrow. He had a 0.1 g/dL M spike but no heavy chain immunofixation noted. His kappa light chain was elevated with an increased ratio of 4.25. His cytogenetics showed + 11 cytogenetic abnormality.    Prior Therapy:  He is status post surgical decompression of an epidural mass on 09/04/2016 and the pathology showed a plasma cell neoplasm.  Bone marrow biopsy obtained on 10/02/2016 showed 10% plasma cell involvement.  He is S/P adjuvant radiation therapy to the thoracic spine to be completed on 10/14/2016.  Velcade 1.5 mg/m weekly with dexamethasone 20 mg and Cytoxan 600 mg po. Therapy started on 10/24/2016.  Current therapy:   Therapy switched to Revlimid 25 mg total dose daily for 14 days on 1 week off starting on 02/07/2016 for at least 2 cycles.  This will be given in combination of Velcade and dexamethasone at the same previous doses.   Interim History:  Mr. Seibert presents today for a follow-up visit.  Since the last visit, he has not started Revlimid but anticipate starting it today. He continues to tolerate Velcade and dexamethasone without new complaints. He continues to have neuropathy in his right lower extremity after surgery. He continues take Neurontin with some improvement in his symptoms especially at nighttime. He denied any neuropathy in his upper extremities or weakness. He continues to ambulate without any falls or syncope but has to be careful in his mobility. He denied any nausea or GI complaints. He denies any constipation. His performance status and quality of  life remains excellent.   He does not report any headaches, blurry vision, syncope or seizures. He does not report any fevers, chills, sweats or weight loss. He does not report any chest pain, palpitation, orthopnea or leg edema. He does not report any cough, wheezing or hemoptysis. He does not report any nausea, vomiting, abdominal pain, constipation, diarrhea or hematochezia. He does not report any frequency, urgency or loss of bladder control. He does not report any lymphadenopathy or petechiae. He does not report any pathological fractures. Remaining review of systems unremarkable  Medications: I have reviewed the patient's current medications.  Current Outpatient Medications  Medication Sig Dispense Refill  . acyclovir (ZOVIRAX) 400 MG tablet Take 1 tablet (400 mg total) by mouth 2 (two) times daily. 60 tablet 3  . aspirin EC 81 MG tablet Take 81 mg by mouth daily.    [name] dexamethasone (DECADRON) 4 MG tablet Take 5 tablets ('20mg'$ ) by mouth once weekly with breakfast on days of chemotherapy. 60 tablet 3  . gabapentin (NEURONTIN) 300 MG capsule Take on in the morning, on the afternoon and two at night 120 capsule 3  . gentamicin (GARAMYCIN) 0.3 % ophthalmic solution Place 1 drop into both eyes 4 (four) times daily. 5 mL 0  . lenalidomide (REVLIMID) 25 MG capsule Take '25mg'$  by mouth once daily for 14 days on, 7 days off, repeat every 21 days 14 capsule 0  . methocarbamol (ROBAXIN) 750 MG tablet     . oxyCODONE-acetaminophen (PERCOCET) 5-325 MG tablet Take 1-2 tablets by mouth every 6 (six) hours as needed for severe pain. 15 tablet 0  . polyethylene glycol (  MIRALAX / GLYCOLAX) packet Take 17 g by mouth 2 (two) times daily. 14 each 0  . prochlorperazine (COMPAZINE) 10 MG tablet Take 1 tablet (10 mg total) by mouth every 6 (six) hours as needed for nausea or vomiting. 30 tablet 0  . senna-docusate (SENOKOT-S) 8.6-50 MG tablet Take 1 tablet by mouth 2 (two) times daily. 60 tablet 0   No current  facility-administered medications for this visit.      Allergies: No Known Allergies  Past Medical History, Surgical history, Social history, and Family History were reviewed and updated.  Physical Exam: Blood pressure (!) 141/83, pulse 80, temperature 98.7 F (37.1 C), temperature source Oral, resp. rate 18, height 6' 0.25" (1.835 m), weight 206 lb 12.8 oz (93.8 kg), SpO2 99 %. ECOG: 1 General appearance: Well-appearing gentleman without distress. Head: Normocephalic, without obvious abnormality no oral thrush or ulcers. Neck: no adenopathy Lymph nodes: Cervical, supraclavicular, and axillary nodes normal. Heart:regular rate and rhythm, S1, S2 normal, no murmur, click, rub or gallop Lung:chest clear, no wheezing, rales, normal symmetric air entry Abdomin: soft, non-tender, without masses or organomegaly no rebound or guarding. EXT:no erythema, induration, or nodules   Lab Results: Lab Results  Component Value Date   WBC 6.1 02/06/2017   HGB 12.0 (L) 02/06/2017   HCT 36.2 (L) 02/06/2017   MCV 97.8 02/06/2017   PLT 258 02/06/2017     Chemistry      Component Value Date/Time   NA 139 01/30/2017 0822   K 3.9 01/30/2017 0822   CL 104 10/08/2016 1304   CO2 23 01/30/2017 0822   BUN 15.0 01/30/2017 0822   CREATININE 0.8 01/30/2017 0822      Component Value Date/Time   CALCIUM 9.4 01/30/2017 0822   ALKPHOS 29 (L) 01/30/2017 0822   AST 26 01/30/2017 0822   ALT 37 01/30/2017 0822   BILITOT 0.36 01/30/2017 0822      Impression and Plan:  67 year old gentleman with the following issues:  1. Plasma cell neoplasm diagnosed and July 2018. He presented with lower extremity weakness and found to have a epidural mass around T4-T6 that required urgent surgical decompression on 09/04/2016. The follow-up pathology confirmed the presence of a plasma cell neoplasm. CT scan of the chest abdomen and pelvis showed no solid tumor malignancy but diffuse bone involvement including a 2.8 cm  in the posterior aspect of the right acetabulum.  Bone marrow biopsy showed 10% plasma cell neoplasm and increased Kappa Light chain in the blood.  He is S/P Velcade, dexamethasone and Cytoxan weekly.  Therapy ended on 01/23/2017.  Protein studies obtained on 01/16/2017 showed continued improvement in his kappa to lambda ratio and his M spike is no longer observed.  He was evaluated by Dr. Norma Fredrickson for potential stem cell transplant and felt that he would be an excellent candidate.    He will start to Revlimid at 25 mg daily for 14 days out of a 21-day cycle starting on 02/06/2017.  He will continue with weekly Velcade and dexamethasone for 2 cycles before evaluation for stem cell transplant.  He is ready to proceed with this therapy without any delay.    2. Epidural tumor compression: He status post surgical decompression accomplished on 09/04/2016. He is completed radiation therapy adjuvantly on 10/14/2016.  3. Bone health: He received the first of Zometa on 09/26/2016 and will be repeated monthly.  Zometa will be held this month because of dental work.  4. Anemia: His hemoglobin remains close to normal range.  7. Prognosis: He remains an excellent performance status and this is a treatable malignancy.   8.  Peripheral neuropathy: Related to surgery and less likely related to Velcade.  He will increased her Neurontin to 600 mg at nighttime with 300 mg in the morning and 300 mg in the afternoon. Not progressing at this time.  9. Follow-up: Will be weekly for treatment and M.D. visit on 02/20/2017.     Zola Button, MD 1/4/20199:00 AM

## 2017-02-06 NOTE — Telephone Encounter (Signed)
Gave avs and calendar for January  °

## 2017-02-09 DIAGNOSIS — C9000 Multiple myeloma not having achieved remission: Secondary | ICD-10-CM | POA: Diagnosis not present

## 2017-02-09 DIAGNOSIS — Z1379 Encounter for other screening for genetic and chromosomal anomalies: Secondary | ICD-10-CM | POA: Diagnosis not present

## 2017-02-09 DIAGNOSIS — C9010 Plasma cell leukemia not having achieved remission: Secondary | ICD-10-CM | POA: Diagnosis not present

## 2017-02-13 ENCOUNTER — Inpatient Hospital Stay: Payer: Medicare Other | Attending: Oncology

## 2017-02-13 ENCOUNTER — Inpatient Hospital Stay: Payer: Medicare Other

## 2017-02-13 VITALS — BP 135/78 | HR 75 | Temp 97.7°F | Resp 19 | Wt 210.5 lb

## 2017-02-13 DIAGNOSIS — Z5112 Encounter for antineoplastic immunotherapy: Secondary | ICD-10-CM | POA: Insufficient documentation

## 2017-02-13 DIAGNOSIS — D630 Anemia in neoplastic disease: Secondary | ICD-10-CM | POA: Diagnosis not present

## 2017-02-13 DIAGNOSIS — C9000 Multiple myeloma not having achieved remission: Secondary | ICD-10-CM

## 2017-02-13 DIAGNOSIS — G629 Polyneuropathy, unspecified: Secondary | ICD-10-CM | POA: Insufficient documentation

## 2017-02-13 LAB — COMPREHENSIVE METABOLIC PANEL
ALT: 32 U/L (ref 0–55)
AST: 28 U/L (ref 5–34)
Albumin: 3.8 g/dL (ref 3.5–5.0)
Alkaline Phosphatase: 32 U/L — ABNORMAL LOW (ref 40–150)
Anion gap: 8 (ref 3–11)
BUN: 11 mg/dL (ref 7–26)
CO2: 26 mmol/L (ref 22–29)
Calcium: 8.8 mg/dL (ref 8.4–10.4)
Chloride: 106 mmol/L (ref 98–109)
Creatinine, Ser: 0.92 mg/dL (ref 0.70–1.30)
GFR calc Af Amer: >60 mL/min (ref >60)
GFR calc non Af Amer: >60 mL/min (ref >60)
Glucose, Bld: 72 mg/dL (ref 70–140)
Potassium: 4 mmol/L (ref 3.5–5.1)
Sodium: 140 mmol/L (ref 136–145)
Total Bilirubin: 0.4 mg/dL (ref 0.2–1.2)
Total Protein: 6.3 g/dL — ABNORMAL LOW (ref 6.4–8.3)

## 2017-02-13 LAB — CBC WITH DIFFERENTIAL/PLATELET
Basophils Absolute: 0 10*3/uL (ref 0.0–0.1)
Basophils Relative: 1 %
Eosinophils Absolute: 0.3 10*3/uL (ref 0.0–0.5)
Eosinophils Relative: 4 %
HCT: 35.5 % — ABNORMAL LOW (ref 38.4–49.9)
Hemoglobin: 11.7 g/dL — ABNORMAL LOW (ref 13.0–17.1)
Lymphocytes Relative: 7 %
Lymphs Abs: 0.5 10*3/uL — ABNORMAL LOW (ref 0.9–3.3)
MCH: 32.4 pg (ref 27.2–33.4)
MCHC: 33 g/dL (ref 32.0–36.0)
MCV: 98.1 fL — ABNORMAL HIGH (ref 79.3–98.0)
Monocytes Absolute: 1 10*3/uL — ABNORMAL HIGH (ref 0.1–0.9)
Monocytes Relative: 14 %
Neutro Abs: 5.6 10*3/uL (ref 1.5–6.5)
Neutrophils Relative %: 74 %
Platelets: 281 10*3/uL (ref 140–400)
RBC: 3.62 MIL/uL — ABNORMAL LOW (ref 4.20–5.82)
RDW: 15.4 % (ref 11.0–15.6)
WBC: 7.4 10*3/uL (ref 4.0–10.3)

## 2017-02-13 MED ORDER — PROCHLORPERAZINE MALEATE 10 MG PO TABS: 10.0000 mg | ORAL_TABLET | Freq: Once | ORAL | Status: DC

## 2017-02-13 MED ORDER — BORTEZOMIB CHEMO SQ INJECTION 3.5 MG (2.5MG/ML)
1.5000 mg/m2 | Freq: Once | INTRAMUSCULAR | Status: AC
  Administered 2017-02-13: 3.25 mg via SUBCUTANEOUS
  Filled 2017-02-13: qty 3.25

## 2017-02-13 NOTE — Progress Notes (Signed)
Patient reports increased neuropathy in right leg and both feet; watery eyes. Patient reports taking 2 gabapentin 3 time per day. Dr. Alen Blew made aware and reports patient dosing of gabapentin is ok and to proceed with treatment.

## 2017-02-13 NOTE — Patient Instructions (Signed)
[facility] Discharge Instructions for Patients Receiving Chemotherapy  Today you received the following chemotherapy agents Velcade.  To help prevent nausea and vomiting after your treatment, we encourage you to take your nausea medication as directed.  If you develop nausea and vomiting that is not controlled by your nausea medication, call the clinic.   BELOW ARE SYMPTOMS THAT SHOULD BE REPORTED IMMEDIATELY:  *FEVER GREATER THAN 100.5 F  *CHILLS WITH OR WITHOUT FEVER  NAUSEA AND VOMITING THAT IS NOT CONTROLLED WITH YOUR NAUSEA MEDICATION  *UNUSUAL SHORTNESS OF BREATH  *UNUSUAL BRUISING OR BLEEDING  TENDERNESS IN MOUTH AND THROAT WITH OR WITHOUT PRESENCE OF ULCERS  *URINARY PROBLEMS  *BOWEL PROBLEMS  UNUSUAL RASH Items with * indicate a potential emergency and should be followed up as soon as possible.  Feel free to call the clinic should you have any questions or concerns. The clinic phone number is [phone].  Please show the CHEMO ALERT CARD at check-in to the Emergency Department and triage nurse.   

## 2017-02-16 LAB — KAPPA/LAMBDA LIGHT CHAINS
Kappa free light chain: 15.8 mg/L (ref 3.3–19.4)
Kappa, lambda light chain ratio: 1.27 (ref 0.26–1.65)
Lambda free light chains: 12.4 mg/L (ref 5.7–26.3)

## 2017-02-17 LAB — MULTIPLE MYELOMA PANEL, SERUM
Albumin SerPl Elph-Mcnc: 3.5 g/dL (ref 2.9–4.4)
Albumin/Glob SerPl: 1.5 (ref 0.7–1.7)
Alpha 1: 0.2 g/dL (ref 0.0–0.4)
Alpha2 Glob SerPl Elph-Mcnc: 0.7 g/dL (ref 0.4–1.0)
B-Globulin SerPl Elph-Mcnc: 1 g/dL (ref 0.7–1.3)
Gamma Glob SerPl Elph-Mcnc: 0.6 g/dL (ref 0.4–1.8)
Globulin, Total: 2.5 g/dL (ref 2.2–3.9)
IgA: 61 mg/dL (ref 61–437)
IgG (Immunoglobin G), Serum: 642 mg/dL — ABNORMAL LOW (ref 700–1600)
IgM (Immunoglobulin M), Srm: 15 mg/dL — ABNORMAL LOW (ref 20–172)
Total Protein ELP: 6 g/dL (ref 6.0–8.5)

## 2017-02-20 ENCOUNTER — Inpatient Hospital Stay: Payer: Medicare Other

## 2017-02-20 ENCOUNTER — Other Ambulatory Visit: Payer: Self-pay | Admitting: *Deleted

## 2017-02-20 ENCOUNTER — Inpatient Hospital Stay (HOSPITAL_BASED_OUTPATIENT_CLINIC_OR_DEPARTMENT_OTHER): Payer: Medicare Other | Admitting: Oncology

## 2017-02-20 VITALS — BP 123/72 | HR 88 | Temp 97.8°F | Resp 18 | Ht 72.25 in | Wt 209.1 lb

## 2017-02-20 DIAGNOSIS — C9000 Multiple myeloma not having achieved remission: Secondary | ICD-10-CM

## 2017-02-20 DIAGNOSIS — G629 Polyneuropathy, unspecified: Secondary | ICD-10-CM

## 2017-02-20 DIAGNOSIS — D630 Anemia in neoplastic disease: Secondary | ICD-10-CM

## 2017-02-20 DIAGNOSIS — Z5112 Encounter for antineoplastic immunotherapy: Secondary | ICD-10-CM | POA: Diagnosis not present

## 2017-02-20 LAB — COMPREHENSIVE METABOLIC PANEL
ALT: 59 U/L — ABNORMAL HIGH (ref 0–55)
AST: 44 U/L — ABNORMAL HIGH (ref 5–34)
Albumin: 3.8 g/dL (ref 3.5–5.0)
Alkaline Phosphatase: 32 U/L — ABNORMAL LOW (ref 40–150)
Anion gap: 9 (ref 3–11)
BUN: 15 mg/dL (ref 7–26)
CO2: 26 mmol/L (ref 22–29)
Calcium: 9.4 mg/dL (ref 8.4–10.4)
Chloride: 104 mmol/L (ref 98–109)
Creatinine, Ser: 0.96 mg/dL (ref 0.70–1.30)
GFR calc Af Amer: >60 mL/min (ref >60)
GFR calc non Af Amer: >60 mL/min (ref >60)
Glucose, Bld: 82 mg/dL (ref 70–140)
Potassium: 4.2 mmol/L (ref 3.5–5.1)
Sodium: 139 mmol/L (ref 136–145)
Total Bilirubin: 0.6 mg/dL (ref 0.2–1.2)
Total Protein: 6.6 g/dL (ref 6.4–8.3)

## 2017-02-20 LAB — CBC WITH DIFFERENTIAL/PLATELET
Basophils Absolute: 0 10*3/uL (ref 0.0–0.1)
Basophils Relative: 0 %
Eosinophils Absolute: 0.4 10*3/uL (ref 0.0–0.5)
Eosinophils Relative: 4 %
HCT: 37.6 % — ABNORMAL LOW (ref 38.4–49.9)
Hemoglobin: 12.5 g/dL — ABNORMAL LOW (ref 13.0–17.1)
Lymphocytes Relative: 14 %
Lymphs Abs: 1.4 10*3/uL (ref 0.9–3.3)
MCH: 32.6 pg (ref 27.2–33.4)
MCHC: 33.2 g/dL (ref 32.0–36.0)
MCV: 97.9 fL (ref 79.3–98.0)
Monocytes Absolute: 0.7 10*3/uL (ref 0.1–0.9)
Monocytes Relative: 8 %
Neutro Abs: 7.1 10*3/uL — ABNORMAL HIGH (ref 1.5–6.5)
Neutrophils Relative %: 74 %
Platelets: 276 10*3/uL (ref 140–400)
RBC: 3.84 MIL/uL — ABNORMAL LOW (ref 4.20–5.82)
RDW: 14.8 % (ref 11.0–15.6)
WBC: 9.5 10*3/uL (ref 4.0–10.3)

## 2017-02-20 MED ORDER — BORTEZOMIB CHEMO SQ INJECTION 3.5 MG (2.5MG/ML)
1.5000 mg/m2 | Freq: Once | INTRAMUSCULAR | Status: AC
  Administered 2017-02-20: 3.25 mg via SUBCUTANEOUS
  Filled 2017-02-20: qty 3.25

## 2017-02-20 MED ORDER — LENALIDOMIDE 25 MG PO CAPS: ORAL_CAPSULE | ORAL | 0 refills | Status: DC

## 2017-02-20 MED ORDER — PROCHLORPERAZINE MALEATE 10 MG PO TABS: 10.0000 mg | ORAL_TABLET | Freq: Once | ORAL | Status: DC

## 2017-02-20 NOTE — Patient Instructions (Signed)
[facility] Discharge Instructions for Patients Receiving Chemotherapy  Today you received the following chemotherapy agents Velcade.  To help prevent nausea and vomiting after your treatment, we encourage you to take your nausea medication as directed.  If you develop nausea and vomiting that is not controlled by your nausea medication, call the clinic.   BELOW ARE SYMPTOMS THAT SHOULD BE REPORTED IMMEDIATELY:  *FEVER GREATER THAN 100.5 F  *CHILLS WITH OR WITHOUT FEVER  NAUSEA AND VOMITING THAT IS NOT CONTROLLED WITH YOUR NAUSEA MEDICATION  *UNUSUAL SHORTNESS OF BREATH  *UNUSUAL BRUISING OR BLEEDING  TENDERNESS IN MOUTH AND THROAT WITH OR WITHOUT PRESENCE OF ULCERS  *URINARY PROBLEMS  *BOWEL PROBLEMS  UNUSUAL RASH Items with * indicate a potential emergency and should be followed up as soon as possible.  Feel free to call the clinic should you have any questions or concerns. The clinic phone number is [phone].  Please show the CHEMO ALERT CARD at check-in to the Emergency Department and triage nurse.   

## 2017-02-20 NOTE — Progress Notes (Signed)
Hematology and Oncology Follow Up Visit  Alexander Saunders [id number] 09-15-1950 67 y.o. 02/20/2017 9:43 AM [name], [name] MDOsei-Bonsu, [name], MD   Principle Diagnosis: 67 year old gentleman with multiple myeloma. He presented with plasmacytoma in  thoracic spine with epidural compression between T4 and T6. His workup revealed diffuse bony involvement, 10% plasma myeloma in the bone marrow. He had a 0.1 g/dL M spike but no heavy chain immunofixation noted. His kappa light chain was elevated with an increased ratio of 4.25. His cytogenetics showed + 11 cytogenetic abnormality.    Prior Therapy:  He is status post surgical decompression of an epidural mass on 09/04/2016 and the pathology showed a plasma cell neoplasm.  Bone marrow biopsy obtained on 10/02/2016 showed 10% plasma cell involvement.  He is S/P adjuvant radiation therapy to the thoracic spine to be completed on 10/14/2016.  Velcade 1.5 mg/m weekly with dexamethasone 20 mg and Cytoxan 600 mg po. Therapy started on 10/24/2016.  Current therapy:   Revlimid 25 mg total dose daily for 14 days on 1 week off starting on 02/07/2016 for at least 2 cycles.  This will be given in combination of Velcade and dexamethasone at the same previous doses.    Interim History:  [name] is here for a follow-up visit.  He completed the first cycle of Revlimid without any major complications.  He did report slight worsening neuropathy in his lower extremities and have increased his Neurontin dose.  That have helped his neuropathy at this time.  He denies any nausea, excessive fatigue or tiredness.  He still able to ambulate short distances without any falls or syncope.  He is having more difficulties ambulating long distances.  He had a bone marrow biopsy completed at [facility] with 3% plasma cell infiltration noted.  He denies any opportunistic infections.  He denies any increased back pain or any pathological  fractures.  He does not report any headaches, blurry vision, syncope or seizures. He does not report any fevers, chills, sweats or weight loss. He does not report any chest pain, palpitation, orthopnea or leg edema. He does not report any cough, wheezing or hemoptysis. He does not report any nausea, vomiting, abdominal pain, constipation, diarrhea or hematochezia. He does not report any frequency, urgency or loss of bladder control. He does not report any lymphadenopathy or petechiae. He does not report any pathological fractures.  Denies any psychiatric issues, anxiety or depression.  He denies any heat or cold intolerance.  Remaining review of systems is negative  Medications: I have reviewed the patient's current medications.  Current Outpatient Medications  Medication Sig Dispense Refill  . acyclovir (ZOVIRAX) 400 MG tablet Take 1 tablet (400 mg total) by mouth 2 (two) times daily. 60 tablet 3  . aspirin EC 81 MG tablet Take 81 mg by mouth daily.    [name] dexamethasone (DECADRON) 4 MG tablet Take 5 tablets (59m) by mouth once weekly with breakfast on days of chemotherapy. 60 tablet 3  . gabapentin (NEURONTIN) 300 MG capsule Take on in the morning, on the afternoon and two at night 120 capsule 3  . gentamicin (GARAMYCIN) 0.3 % ophthalmic solution Place 1 drop into both eyes 4 (four) times daily. 5 mL 0  . methocarbamol (ROBAXIN) 750 MG tablet     . polyethylene glycol (MIRALAX / GLYCOLAX) packet Take 17 g by mouth 2 (two) times daily. 14 each 0  . prochlorperazine (COMPAZINE) 10 MG tablet Take 1 tablet (10 mg total) by mouth  every 6 (six) hours as needed for nausea or vomiting. 30 tablet 0  . senna-docusate (SENOKOT-S) 8.6-50 MG tablet Take 1 tablet by mouth 2 (two) times daily. 60 tablet 0  . lenalidomide (REVLIMID) 25 MG capsule Take 48m by mouth once daily for 14 days on, 7 days off, repeat every 21 days 14 capsule 0   No current facility-administered medications for this visit.     Facility-Administered Medications Ordered in Other Visits  Medication Dose Route Frequency Provider Last Rate Last Dose  . bortezomib SQ (VELCADE) chemo injection 3.25 mg  1.5 mg/m2 (Treatment Plan Recorded) Subcutaneous Once Keny Donald N, MD      . prochlorperazine (COMPAZINE) tablet 10 mg  10 mg Oral Once Merlyn Bollen, FMathis Dad MD         Allergies: No Known Allergies  Past Medical History, Surgical history, Social history, and Family History reviewed personally and remain unchanged.  Physical Exam: Blood pressure 123/72, pulse 88, temperature 97.8 F (36.6 C), temperature source Oral, resp. rate 18, height 6' 0.25" (1.835 m), weight 209 lb 1.6 oz (94.8 kg), SpO2 100 %. ECOG: 1 General appearance: Alert, awake gentleman without distress. Head: Normocephalic, without obvious abnormality  Oral mucosa: Without thrush or ulcers. Eyes: No scleral icterus. Lymph nodes: Cervical, supraclavicular, and axillary nodes normal. Heart:regular rate and rhythm, S1, S2 normal, no murmur, click, rub or gallop Lung:chest clear, no wheezing, rales, normal symmetric air entry Abdomin: soft, non-tender, without masses or organomegaly shifting dullness or ascites. Neurological: No sensory, motor deficits.  Deep tendon reflexes intact. Musculoskeletal: No joint deformities, effusion.   Skin: No rashes or lesions.   Lab Results: Lab Results  Component Value Date   WBC 9.5 02/20/2017   HGB 12.5 (L) 02/20/2017   HCT 37.6 (L) 02/20/2017   MCV 97.9 02/20/2017   PLT 276 02/20/2017     Chemistry      Component Value Date/Time   NA 139 02/20/2017 0853   NA 137 02/06/2017 0828   K 4.2 02/20/2017 0853   K 4.0 02/06/2017 0828   CL 104 02/20/2017 0853   CO2 26 02/20/2017 0853   CO2 24 02/06/2017 0828   BUN 15 02/20/2017 0853   BUN 14.9 02/06/2017 0828   CREATININE 0.96 02/20/2017 0853   CREATININE 0.9 02/06/2017 0828      Component Value Date/Time   CALCIUM 9.4 02/20/2017 0853   CALCIUM 9.1  02/06/2017 0828   ALKPHOS 32 (L) 02/20/2017 0853   ALKPHOS 31 (L) 02/06/2017 0828   AST 44 (H) 02/20/2017 0853   AST 32 02/06/2017 0828   ALT 59 (H) 02/20/2017 0853   ALT 43 02/06/2017 0828   BILITOT 0.6 02/20/2017 0853   BILITOT 0.42 02/06/2017 0828     Results for BDORRANCE, SELLICK(MRN [id number] as of 02/20/2017 08:52  Ref. Range 02/13/2017 08:26  IgG (Immunoglobin G), Serum Latest Ref Range: 700 - 1,600 mg/dL 642 (L)  IgA Latest Ref Range: 61 - 437 mg/dL 61  IgM (Immunoglobulin M), Srm Latest Ref Range: 20 - 172 mg/dL 15 (L)  Kappa free light chain Latest Ref Range: 3.3 - 19.4 mg/L 15.8  Lamda free light chains Latest Ref Range: 5.7 - 26.3 mg/L 12.4  Kappa, lamda light chain ratio Latest Ref Range: 0.26 - 1.65  1.27  M Protein SerPl Elph-Mcnc Latest Ref Range: Not Observed g/dL Not Observed     Impression and Plan:  67year old gentleman with the following issues:  1.  Multiple myeloma diagnosed and  July 2018. He presented with epidural mass around T4-T6 that required urgent surgical decompression on 09/04/2016. Pathology confirmed the presence of a plasma cell neoplasm. Bone marrow biopsy showed 10% plasma cell neoplasm and increased Kappa Light chain in the blood.  He is S/P Velcade, dexamethasone and Cytoxan weekly.  Therapy ended on 01/23/2017.    He will start to Revlimid at 25 mg daily for 14 days out of a 21-day cycle starting on 02/06/2017.  He will continue with weekly Velcade and dexamethasone for 2 cycles before evaluation for stem cell transplant.  He completed the first cycle of Revlimid in combination with Velcade, dexamethasone without complications.  Protein studies obtained on February 13, 2017 were personally reviewed and continues to show excellent response to therapy.  His M spike is no longer detected and he has normalization of his kappa and lambda serum light chains.  Risks and benefits of proceeding with a second cycle of Revlimid was reviewed today.   Other complications including constipation, rash and cytopenias were reviewed.  He is agreeable to proceed at this time.    2. Epidural tumor compression: He status post surgical decompression accomplished on 09/04/2016. He is completed radiation therapy adjuvantly on 10/14/2016.  PET/CT scan is scheduled for a follow-up purposes in the near future at [facility].  3. Bone health: He received the first of Zometa on 09/26/2016 and will be repeated monthly.  Zometa was held because of dental work.  4. Anemia: His hemoglobin has improved and close to normal range.  His anemia is related to multiple myeloma as well as treatment.  7. Prognosis: Performance status remain excellent and the goal of therapy is curative.  8.  Peripheral neuropathy: Related to surgery and less likely related to Velcade.  He continues to be on Neurontin which have helped his symptoms.  9. Follow-up: Will be weekly for Velcade.  He will have a follow-up in 2 weeks to follow his progress.  25 minutes was spent with the patient face-to-face today.  More than 50% of time was dedicated to patient counseling, education and coordination of his multifaceted care.      Zola Button, MD 1/18/20199:43 AM

## 2017-02-27 ENCOUNTER — Other Ambulatory Visit: Payer: Self-pay | Admitting: *Deleted

## 2017-02-27 ENCOUNTER — Inpatient Hospital Stay: Payer: Medicare Other

## 2017-02-27 VITALS — BP 112/67 | HR 69 | Temp 97.7°F | Resp 17

## 2017-02-27 DIAGNOSIS — C9000 Multiple myeloma not having achieved remission: Secondary | ICD-10-CM

## 2017-02-27 DIAGNOSIS — D630 Anemia in neoplastic disease: Secondary | ICD-10-CM | POA: Diagnosis not present

## 2017-02-27 DIAGNOSIS — Z5112 Encounter for antineoplastic immunotherapy: Secondary | ICD-10-CM | POA: Diagnosis not present

## 2017-02-27 DIAGNOSIS — G629 Polyneuropathy, unspecified: Secondary | ICD-10-CM | POA: Diagnosis not present

## 2017-02-27 LAB — CBC WITH DIFFERENTIAL/PLATELET
Basophils Absolute: 0.1 10*3/uL (ref 0.0–0.1)
Basophils Relative: 1 %
Eosinophils Absolute: 0.6 10*3/uL — ABNORMAL HIGH (ref 0.0–0.5)
Eosinophils Relative: 11 %
HCT: 36.4 % — ABNORMAL LOW (ref 38.4–49.9)
Hemoglobin: 12.2 g/dL — ABNORMAL LOW (ref 13.0–17.1)
Lymphocytes Relative: 14 %
Lymphs Abs: 0.8 10*3/uL — ABNORMAL LOW (ref 0.9–3.3)
MCH: 32.6 pg (ref 27.2–33.4)
MCHC: 33.4 g/dL (ref 32.0–36.0)
MCV: 97.7 fL (ref 79.3–98.0)
Monocytes Absolute: 0.8 10*3/uL (ref 0.1–0.9)
Monocytes Relative: 14 %
Neutro Abs: 3.3 10*3/uL (ref 1.5–6.5)
Neutrophils Relative %: 60 %
Platelets: 275 10*3/uL (ref 140–400)
RBC: 3.72 MIL/uL — ABNORMAL LOW (ref 4.20–5.82)
RDW: 14.9 % (ref 11.0–15.6)
WBC: 5.5 10*3/uL (ref 4.0–10.3)

## 2017-02-27 LAB — COMPREHENSIVE METABOLIC PANEL
ALT: 34 U/L (ref 0–55)
AST: 24 U/L (ref 5–34)
Albumin: 3.4 g/dL — ABNORMAL LOW (ref 3.5–5.0)
Alkaline Phosphatase: 25 U/L — ABNORMAL LOW (ref 40–150)
Anion gap: 9 (ref 3–11)
BUN: 16 mg/dL (ref 7–26)
CO2: 24 mmol/L (ref 22–29)
Calcium: 9.3 mg/dL (ref 8.4–10.4)
Chloride: 107 mmol/L (ref 98–109)
Creatinine, Ser: 1.07 mg/dL (ref 0.70–1.30)
GFR calc Af Amer: >60 mL/min (ref >60)
GFR calc non Af Amer: >60 mL/min (ref >60)
Glucose, Bld: 129 mg/dL (ref 70–140)
Potassium: 3.7 mmol/L (ref 3.5–5.1)
Sodium: 140 mmol/L (ref 136–145)
Total Bilirubin: 0.6 mg/dL (ref 0.2–1.2)
Total Protein: 5.9 g/dL — ABNORMAL LOW (ref 6.4–8.3)

## 2017-02-27 MED ORDER — LENALIDOMIDE 25 MG PO CAPS: ORAL_CAPSULE | ORAL | 0 refills | Status: DC

## 2017-02-27 MED ORDER — PROCHLORPERAZINE MALEATE 10 MG PO TABS: 10.0000 mg | ORAL_TABLET | Freq: Once | ORAL | Status: DC

## 2017-02-27 MED ORDER — BORTEZOMIB CHEMO SQ INJECTION 3.5 MG (2.5MG/ML)
1.5000 mg/m2 | Freq: Once | INTRAMUSCULAR | Status: AC
  Administered 2017-02-27: 3.25 mg via SUBCUTANEOUS
  Filled 2017-02-27: qty 3.25

## 2017-02-27 NOTE — Patient Instructions (Signed)
[facility] Discharge Instructions for Patients Receiving Chemotherapy  Today you received the following chemotherapy agents: Bortezomib (Velcade)  To help prevent nausea and vomiting after your treatment, we encourage you to take your nausea medication  as prescribed.    If you develop nausea and vomiting that is not controlled by your nausea medication, call the clinic.   BELOW ARE SYMPTOMS THAT SHOULD BE REPORTED IMMEDIATELY:  *FEVER GREATER THAN 100.5 F  *CHILLS WITH OR WITHOUT FEVER  NAUSEA AND VOMITING THAT IS NOT CONTROLLED WITH YOUR NAUSEA MEDICATION  *UNUSUAL SHORTNESS OF BREATH  *UNUSUAL BRUISING OR BLEEDING  TENDERNESS IN MOUTH AND THROAT WITH OR WITHOUT PRESENCE OF ULCERS  *URINARY PROBLEMS  *BOWEL PROBLEMS  UNUSUAL RASH Items with * indicate a potential emergency and should be followed up as soon as possible.  Feel free to call the clinic should you have any questions or concerns. The clinic phone number is [phone].  Please show the CHEMO ALERT CARD at check-in to the Emergency Department and triage nurse.   

## 2017-03-02 ENCOUNTER — Telehealth: Payer: Self-pay | Admitting: *Deleted

## 2017-03-02 NOTE — Telephone Encounter (Signed)
Returned patient's phone call regarding filling Revlimid prescription. Informed him that I faxed it on 02/27/17 to [place]. Gave him their phone number to call and check for shipment date. Instructed him to call [facility] if he had any questions or concerns.

## 2017-03-06 ENCOUNTER — Inpatient Hospital Stay: Payer: Medicare Other

## 2017-03-06 ENCOUNTER — Inpatient Hospital Stay: Payer: Medicare Other | Attending: Oncology

## 2017-03-06 VITALS — BP 129/75 | HR 82 | Temp 98.5°F | Resp 18

## 2017-03-06 DIAGNOSIS — Z5112 Encounter for antineoplastic immunotherapy: Secondary | ICD-10-CM | POA: Insufficient documentation

## 2017-03-06 DIAGNOSIS — C9000 Multiple myeloma not having achieved remission: Secondary | ICD-10-CM

## 2017-03-06 LAB — COMPREHENSIVE METABOLIC PANEL
ALT: 21 U/L (ref 0–55)
AST: 18 U/L (ref 5–34)
Albumin: 3.7 g/dL (ref 3.5–5.0)
Alkaline Phosphatase: 27 U/L — ABNORMAL LOW (ref 40–150)
Anion gap: 11 (ref 3–11)
BUN: 23 mg/dL (ref 7–26)
CO2: 23 mmol/L (ref 22–29)
Calcium: 9.3 mg/dL (ref 8.4–10.4)
Chloride: 106 mmol/L (ref 98–109)
Creatinine, Ser: 1.03 mg/dL (ref 0.70–1.30)
GFR calc Af Amer: >60 mL/min (ref >60)
GFR calc non Af Amer: >60 mL/min (ref >60)
Glucose, Bld: 108 mg/dL (ref 70–140)
Potassium: 3.7 mmol/L (ref 3.5–5.1)
Sodium: 140 mmol/L (ref 136–145)
Total Bilirubin: 0.6 mg/dL (ref 0.2–1.2)
Total Protein: 6.5 g/dL (ref 6.4–8.3)

## 2017-03-06 LAB — CBC WITH DIFFERENTIAL/PLATELET
Basophils Absolute: 0.1 10*3/uL (ref 0.0–0.1)
Basophils Relative: 1 %
Eosinophils Absolute: 0.4 10*3/uL (ref 0.0–0.5)
Eosinophils Relative: 7 %
HCT: 37.6 % — ABNORMAL LOW (ref 38.4–49.9)
Hemoglobin: 12.6 g/dL — ABNORMAL LOW (ref 13.0–17.1)
Lymphocytes Relative: 13 %
Lymphs Abs: 0.7 10*3/uL — ABNORMAL LOW (ref 0.9–3.3)
MCH: 33.1 pg (ref 27.2–33.4)
MCHC: 33.5 g/dL (ref 32.0–36.0)
MCV: 98.8 fL — ABNORMAL HIGH (ref 79.3–98.0)
Monocytes Absolute: 0.7 10*3/uL (ref 0.1–0.9)
Monocytes Relative: 13 %
Neutro Abs: 3.7 10*3/uL (ref 1.5–6.5)
Neutrophils Relative %: 66 %
Platelets: 278 10*3/uL (ref 140–400)
RBC: 3.81 MIL/uL — ABNORMAL LOW (ref 4.20–5.82)
RDW: 15.2 % — ABNORMAL HIGH (ref 11.0–14.6)
WBC: 5.5 10*3/uL (ref 4.0–10.3)

## 2017-03-06 MED ORDER — PROCHLORPERAZINE MALEATE 10 MG PO TABS: 10.0000 mg | ORAL_TABLET | Freq: Once | ORAL | Status: DC

## 2017-03-06 MED ORDER — BORTEZOMIB CHEMO SQ INJECTION 3.5 MG (2.5MG/ML)
1.5000 mg/m2 | Freq: Once | INTRAMUSCULAR | Status: AC
  Administered 2017-03-06: 3.25 mg via SUBCUTANEOUS
  Filled 2017-03-06: qty 3.25

## 2017-03-06 NOTE — Patient Instructions (Signed)
[facility] Discharge Instructions for Patients Receiving Chemotherapy  Today you received the following chemotherapy agents: Bortezomib (Velcade)  To help prevent nausea and vomiting after your treatment, we encourage you to take your nausea medication  as prescribed.    If you develop nausea and vomiting that is not controlled by your nausea medication, call the clinic.   BELOW ARE SYMPTOMS THAT SHOULD BE REPORTED IMMEDIATELY:  *FEVER GREATER THAN 100.5 F  *CHILLS WITH OR WITHOUT FEVER  NAUSEA AND VOMITING THAT IS NOT CONTROLLED WITH YOUR NAUSEA MEDICATION  *UNUSUAL SHORTNESS OF BREATH  *UNUSUAL BRUISING OR BLEEDING  TENDERNESS IN MOUTH AND THROAT WITH OR WITHOUT PRESENCE OF ULCERS  *URINARY PROBLEMS  *BOWEL PROBLEMS  UNUSUAL RASH Items with * indicate a potential emergency and should be followed up as soon as possible.  Feel free to call the clinic should you have any questions or concerns. The clinic phone number is [phone].  Please show the CHEMO ALERT CARD at check-in to the Emergency Department and triage nurse.   

## 2017-03-12 ENCOUNTER — Other Ambulatory Visit: Payer: Self-pay | Admitting: *Deleted

## 2017-03-12 ENCOUNTER — Other Ambulatory Visit: Payer: Self-pay | Admitting: Oncology

## 2017-03-12 DIAGNOSIS — C9000 Multiple myeloma not having achieved remission: Secondary | ICD-10-CM

## 2017-03-12 DIAGNOSIS — C7951 Secondary malignant neoplasm of bone: Secondary | ICD-10-CM

## 2017-03-13 ENCOUNTER — Other Ambulatory Visit: Payer: Self-pay | Admitting: *Deleted

## 2017-03-13 ENCOUNTER — Other Ambulatory Visit: Payer: Medicare Other

## 2017-03-13 ENCOUNTER — Inpatient Hospital Stay (HOSPITAL_BASED_OUTPATIENT_CLINIC_OR_DEPARTMENT_OTHER): Payer: Medicare Other | Admitting: Oncology

## 2017-03-13 ENCOUNTER — Inpatient Hospital Stay: Payer: Medicare Other

## 2017-03-13 ENCOUNTER — Telehealth: Payer: Self-pay | Admitting: Oncology

## 2017-03-13 VITALS — BP 130/83 | HR 80 | Temp 97.9°F | Resp 18 | Ht 72.0 in | Wt 211.4 lb

## 2017-03-13 DIAGNOSIS — C9000 Multiple myeloma not having achieved remission: Secondary | ICD-10-CM | POA: Diagnosis not present

## 2017-03-13 DIAGNOSIS — Z5112 Encounter for antineoplastic immunotherapy: Secondary | ICD-10-CM | POA: Diagnosis not present

## 2017-03-13 DIAGNOSIS — C7951 Secondary malignant neoplasm of bone: Secondary | ICD-10-CM

## 2017-03-13 LAB — CMP (CANCER CENTER ONLY)
ALT: 12 U/L (ref 0–55)
AST: 15 U/L (ref 5–34)
Albumin: 3.7 g/dL (ref 3.5–5.0)
Alkaline Phosphatase: 30 U/L — ABNORMAL LOW (ref 40–150)
Anion gap: 9 (ref 3–11)
BUN: 17 mg/dL (ref 7–26)
CO2: 24 mmol/L (ref 22–29)
Calcium: 9.2 mg/dL (ref 8.4–10.4)
Chloride: 106 mmol/L (ref 98–109)
Creatinine: 1.04 mg/dL (ref 0.70–1.30)
GFR, Est AFR Am: >60 mL/min (ref >60)
GFR, Estimated: >60 mL/min (ref >60)
Glucose, Bld: 85 mg/dL (ref 70–140)
Potassium: 3.9 mmol/L (ref 3.5–5.1)
Sodium: 139 mmol/L (ref 136–145)
Total Bilirubin: 0.7 mg/dL (ref 0.2–1.2)
Total Protein: 6.3 g/dL — ABNORMAL LOW (ref 6.4–8.3)

## 2017-03-13 LAB — CBC WITH DIFFERENTIAL (CANCER CENTER ONLY)
Basophils Absolute: 0.1 10*3/uL (ref 0.0–0.1)
Basophils Relative: 1 %
Eosinophils Absolute: 0.7 10*3/uL — ABNORMAL HIGH (ref 0.0–0.5)
Eosinophils Relative: 9 %
HCT: 38.1 % — ABNORMAL LOW (ref 38.4–49.9)
Hemoglobin: 12.7 g/dL — ABNORMAL LOW (ref 13.0–17.1)
Lymphocytes Relative: 9 %
Lymphs Abs: 0.7 10*3/uL — ABNORMAL LOW (ref 0.9–3.3)
MCH: 32.8 pg (ref 27.2–33.4)
MCHC: 33.3 g/dL (ref 32.0–36.0)
MCV: 98.2 fL — ABNORMAL HIGH (ref 79.3–98.0)
Monocytes Absolute: 0.7 10*3/uL (ref 0.1–0.9)
Monocytes Relative: 9 %
Neutro Abs: 5.8 10*3/uL (ref 1.5–6.5)
Neutrophils Relative %: 72 %
Platelet Count: 285 10*3/uL (ref 140–400)
RBC: 3.88 MIL/uL — ABNORMAL LOW (ref 4.20–5.82)
RDW: 14.7 % — ABNORMAL HIGH (ref 11.0–14.6)
WBC Count: 8 10*3/uL (ref 4.0–10.3)

## 2017-03-13 MED ORDER — PROCHLORPERAZINE MALEATE 10 MG PO TABS: 10.0000 mg | ORAL_TABLET | Freq: Once | ORAL | Status: DC

## 2017-03-13 MED ORDER — DEXAMETHASONE 4 MG PO TABS
ORAL_TABLET | ORAL | 3 refills | Status: DC
Start: 2017-03-13 — End: 2018-04-02

## 2017-03-13 MED ORDER — PROCHLORPERAZINE MALEATE 10 MG PO TABS: 10.0000 mg | ORAL_TABLET | Freq: Four times a day (QID) | ORAL | 1 refills | Status: DC | PRN

## 2017-03-13 MED ORDER — BORTEZOMIB CHEMO SQ INJECTION 3.5 MG (2.5MG/ML)
1.5000 mg/m2 | Freq: Once | INTRAMUSCULAR | Status: AC
  Administered 2017-03-13: 3.25 mg via SUBCUTANEOUS
  Filled 2017-03-13: qty 3.25

## 2017-03-13 NOTE — Telephone Encounter (Signed)
Gave avs and calendar for February and march °

## 2017-03-13 NOTE — Patient Instructions (Signed)
[facility] Discharge Instructions for Patients Receiving Chemotherapy  Today you received the following chemotherapy agents Velcade.  To help prevent nausea and vomiting after your treatment, we encourage you to take your nausea medication as directed.  If you develop nausea and vomiting that is not controlled by your nausea medication, call the clinic.   BELOW ARE SYMPTOMS THAT SHOULD BE REPORTED IMMEDIATELY:  *FEVER GREATER THAN 100.5 F  *CHILLS WITH OR WITHOUT FEVER  NAUSEA AND VOMITING THAT IS NOT CONTROLLED WITH YOUR NAUSEA MEDICATION  *UNUSUAL SHORTNESS OF BREATH  *UNUSUAL BRUISING OR BLEEDING  TENDERNESS IN MOUTH AND THROAT WITH OR WITHOUT PRESENCE OF ULCERS  *URINARY PROBLEMS  *BOWEL PROBLEMS  UNUSUAL RASH Items with * indicate a potential emergency and should be followed up as soon as possible.  Feel free to call the clinic should you have any questions or concerns. The clinic phone number is [phone].  Please show the CHEMO ALERT CARD at check-in to the Emergency Department and triage nurse.   

## 2017-03-13 NOTE — Progress Notes (Signed)
Hematology and Oncology Follow Up Visit  Alexander Saunders [id number] 12-14-1950 67 y.o. 03/13/2017 9:41 AM [name], [name], MDOsei-Bonsu, [name], MD   Principle Diagnosis: 67 year old man with kappa light chain multiple myeloma. 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  Bone marrow biopsy showed 10% plasma myeloma with  0.1 g/dL M spike but no heavy chain immunofixation noted. His kappa light chain was elevated with an increased ratio of 4.25. His cytogenetics showed + 11.    Prior Therapy:  He is status post surgical decompression of an epidural mass on 09/04/2016 and the pathology showed a plasma cell neoplasm.  He is S/P adjuvant radiation therapy to the thoracic spine to be completed on 10/14/2016.  Velcade 1.5 mg/m weekly with dexamethasone 20 mg and Cytoxan 600 mg po. Therapy started on 10/24/2016.  Current therapy:   Revlimid 25 mg total dose daily for 14 days on 1 week off starting on 02/07/2016. Velcade 1.5 mg/m square weekly and dexamethasone 20 mg weekly.   Interim History:  Alexander Saunders presents today for a follow-up.  Since the last visit, he is receiving cycle 2 of Revlimid and anticipate to finish the second cycle next week.  He continues to be on Velcade and dexamethasone weekly as well.  He tolerated Revlimid reasonably well with mild fatigue but no other major complaints.  He continues to have neuropathy in his right lower extremity but has not changed dramatically.  He is able to ambulate without any falls or syncope.  He denies any neuropathy in his upper extremities.  He denies any back pain or pathological fractures.  His appetite and performance status remain excellent.   He does not report any headaches, blurry vision, syncope or seizures. He does not report any fevers, chills, sweats or weight loss. He does not report any chest pain, palpitation, orthopnea or leg edema. He does not report any cough, wheezing or  hemoptysis. He does not report any nausea, vomiting, abdominal pain, constipation, diarrhea or hematochezia. He does not report any frequency, urgency. He does not report any lymphadenopathy or petechiae. He does not report any pathological fractures.  Denies any psychiatric issues, anxiety or depression.  He denies any skin rashes or lesions.  Remaining review of systems is negative  Medications: I have reviewed the patient's current medications.  Current Outpatient Medications  Medication Sig Dispense Refill  . acyclovir (ZOVIRAX) 400 MG tablet Take 1 tablet (400 mg total) by mouth 2 (two) times daily. 60 tablet 3  . aspirin EC 81 MG tablet Take 81 mg by mouth daily.    [name] dexamethasone (DECADRON) 4 MG tablet Take 5 tablets (93m) by mouth once weekly with breakfast on days of chemotherapy. 60 tablet 3  . gabapentin (NEURONTIN) 300 MG capsule Take on in the morning, on the afternoon and two at night 120 capsule 3  . lenalidomide (REVLIMID) 25 MG capsule Take 264mby mouth once daily for 14 days on, 7 days off, repeat every 21 days 14 capsule 0  . methocarbamol (ROBAXIN) 750 MG tablet     . polyethylene glycol (MIRALAX / GLYCOLAX) packet Take 17 g by mouth 2 (two) times daily. 14 each 0  . prochlorperazine (COMPAZINE) 10 MG tablet Take 1 tablet (10 mg total) by mouth every 6 (six) hours as needed for nausea or vomiting. 30 tablet 1  . senna-docusate (SENOKOT-S) 8.6-50 MG tablet Take 1 tablet by mouth 2 (two) times daily. 60 tablet 0  No current facility-administered medications for this visit.      Allergies: No Known Allergies  Past Medical History, Surgical history, Social history, and Family History updated today without any changes.  Physical Exam: Blood pressure 130/83, pulse 80, temperature 97.9 F (36.6 C), temperature source Oral, resp. rate 18, height 6' (1.829 m), weight 211 lb 6.4 oz (95.9 kg), SpO2 100 %. ECOG: 1 General appearance: Well-appearing gentleman appeared  comfortable. Head: Normocephalic, without obvious abnormality.  Atraumatic. Oral mucosa: Mucous membranes are moist and pink.  Eyes: No scleral icterus.  Pupils are equal and round and reactive to light. Lymph nodes: Cervical, supraclavicular, and axillary nodes normal. Heart: Regular rate and rhythm without murmurs rubs or gallops. Lung: clear to auscultation without rhonchi wheezes or dullness to percussion. Abdomin: Soft, nontender without rebound or guarding.  Good bowel sounds in all 4 quadrants. Neurological: No sensory, motor deficits.  Ambulating without any difficulties. Musculoskeletal: Full range of motion noted in all joints.  No joint deformities or effusion.   Skin: No petechiae or ecchymosis.   Lab Results: Lab Results  Component Value Date   WBC 8.0 03/13/2017   HGB 12.6 (L) 03/06/2017   HCT 38.1 (L) 03/13/2017   MCV 98.2 (H) 03/13/2017   PLT 285 03/13/2017     Chemistry      Component Value Date/Time   NA 139 03/13/2017 0825   NA 137 02/06/2017 0828   K 3.9 03/13/2017 0825   K 4.0 02/06/2017 0828   CL 106 03/13/2017 0825   CO2 24 03/13/2017 0825   CO2 24 02/06/2017 0828   BUN 17 03/13/2017 0825   BUN 14.9 02/06/2017 0828   CREATININE 1.04 03/13/2017 0825   CREATININE 0.9 02/06/2017 0828      Component Value Date/Time   CALCIUM 9.2 03/13/2017 0825   CALCIUM 9.1 02/06/2017 0828   ALKPHOS 30 (L) 03/13/2017 0825   ALKPHOS 31 (L) 02/06/2017 0828   AST 15 03/13/2017 0825   AST 32 02/06/2017 0828   ALT 12 03/13/2017 0825   ALT 43 02/06/2017 0828   BILITOT 0.7 03/13/2017 0825   BILITOT 0.42 02/06/2017 0828     Results for Alexander Saunders, Alexander Saunders (MRN [id number]) as of 03/13/2017 09:47  Ref. Range 02/13/2017 08:26  Kappa free light chain Latest Ref Range: 3.3 - 19.4 mg/L 15.8  Lamda free light chains Latest Ref Range: 5.7 - 26.3 mg/L 12.4  Kappa, lamda light chain ratio Latest Ref Range: 0.26 - 1.65  1.27      Impression and Plan:  67 year old gentleman with  the following issues:  1.  Light chain multiple myeloma diagnosed and July 2018.  He was found to have a plasmacytoma and elevated free kappa light chain of 37.8 and 10% involvement of the bone marrow.  He was treated Velcade, dexamethasone and Cytoxan weekly until December 2018 and was switched to Revlimid Velcade and dexamethasone regimen.  He completed the first cycle of Revlimid without any major complications and currently receiving the second cycle.  The plan is to complete 2-3 cycles of this current regimen and to undergo high-dose chemotherapy and stem cell transplant after that.  He had a lot of questions regarding the logistics of this approach and how he will feel around the treatment time as well as afterwards.  He is concerned about attending school while receiving this current treatment as well as receiving high-dose chemotherapy.  I addressed his questions and concerns and he favors proceeding with high-dose chemotherapy closer to April  after he is done with his schoolwork.  I do not have any objections to that timeline and he will meet with Dr. Norma Fredrickson at [facility] to finalize this.  In the meantime we will continue on weekly Velcade and dexamethasone in addition to Revlimid for at least one more cycle.  We will continue to evaluate him throughout the process.   2.  Plasmacytoma resulting in epidural tumor compression: He received surgery followed by radiation therapy.  He will have repeat imaging studies including a PET scan to evaluate tumor recurrence.  3. Bone health: He received the first of Zometa on 09/26/2016.  This has been on hold because of dental work.  Risks and benefits of restarting Zometa was reviewed and for the time being we will continue to hold it.  4. Anemia: Hemoglobin close to normal range at this time.  5. Prognosis: The natural course of this disease was reviewed and he understands he can still achieve a complete response  could be consolidated with stem cell transplant for an extended period of time.  His performance status is excellent and aggressive therapy is warranted.  6.  Peripheral neuropathy: Unchanged by my evaluation today.  His neuropathy is related to his previous surgery other than exacerbation of Velcade or Revlimid.  We will continue to monitor this moving forward.  Is currently on Neurontin.     7. Follow-up: Will be weekly for Velcade.  He will have a follow-up in 5 weeks to follow his progress.  25 minutes was spent with the patient face-to-face today.  More than 50% of time was dedicated to patient counseling, education and addressing his questions regarding diagnosis, prognosis and timing of his next stages of care.     Zola Button, MD 2/8/20199:41 AM

## 2017-03-16 DIAGNOSIS — C9000 Multiple myeloma not having achieved remission: Secondary | ICD-10-CM | POA: Diagnosis not present

## 2017-03-17 ENCOUNTER — Other Ambulatory Visit: Payer: Self-pay | Admitting: *Deleted

## 2017-03-17 DIAGNOSIS — C9000 Multiple myeloma not having achieved remission: Secondary | ICD-10-CM

## 2017-03-17 MED ORDER — LENALIDOMIDE 25 MG PO CAPS: ORAL_CAPSULE | ORAL | 0 refills | Status: DC

## 2017-03-20 ENCOUNTER — Inpatient Hospital Stay: Payer: Medicare Other

## 2017-03-20 ENCOUNTER — Encounter: Payer: Self-pay | Admitting: *Deleted

## 2017-03-20 ENCOUNTER — Other Ambulatory Visit: Payer: Self-pay | Admitting: Medical

## 2017-03-20 VITALS — BP 135/80 | HR 85 | Temp 98.3°F | Resp 17

## 2017-03-20 DIAGNOSIS — C9000 Multiple myeloma not having achieved remission: Secondary | ICD-10-CM

## 2017-03-20 DIAGNOSIS — Z5112 Encounter for antineoplastic immunotherapy: Secondary | ICD-10-CM | POA: Diagnosis not present

## 2017-03-20 LAB — CBC WITH DIFFERENTIAL (CANCER CENTER ONLY)
Basophils Absolute: 0 10*3/uL (ref 0.0–0.1)
Basophils Relative: 1 %
Eosinophils Absolute: 1 10*3/uL — ABNORMAL HIGH (ref 0.0–0.5)
Eosinophils Relative: 16 %
HCT: 37.2 % — ABNORMAL LOW (ref 38.4–49.9)
Hemoglobin: 12.5 g/dL — ABNORMAL LOW (ref 13.0–17.1)
Lymphocytes Relative: 7 %
Lymphs Abs: 0.4 10*3/uL — ABNORMAL LOW (ref 0.9–3.3)
MCH: 33.3 pg (ref 27.2–33.4)
MCHC: 33.7 g/dL (ref 32.0–36.0)
MCV: 98.8 fL — ABNORMAL HIGH (ref 79.3–98.0)
Monocytes Absolute: 1.2 10*3/uL — ABNORMAL HIGH (ref 0.1–0.9)
Monocytes Relative: 19 %
Neutro Abs: 3.6 10*3/uL (ref 1.5–6.5)
Neutrophils Relative %: 57 %
Platelet Count: 274 10*3/uL (ref 140–400)
RBC: 3.76 MIL/uL — ABNORMAL LOW (ref 4.20–5.82)
RDW: 14.2 % (ref 11.0–14.6)
WBC Count: 6.2 10*3/uL (ref 4.0–10.3)

## 2017-03-20 LAB — CMP (CANCER CENTER ONLY)
ALT: 15 U/L (ref 0–55)
AST: 17 U/L (ref 5–34)
Albumin: 3.6 g/dL (ref 3.5–5.0)
Alkaline Phosphatase: 26 U/L — ABNORMAL LOW (ref 40–150)
Anion gap: 9 (ref 3–11)
BUN: 17 mg/dL (ref 7–26)
CO2: 25 mmol/L (ref 22–29)
Calcium: 9.2 mg/dL (ref 8.4–10.4)
Chloride: 107 mmol/L (ref 98–109)
Creatinine: 1.07 mg/dL (ref 0.70–1.30)
GFR, Est AFR Am: >60 mL/min (ref >60)
GFR, Estimated: >60 mL/min (ref >60)
Glucose, Bld: 69 mg/dL — ABNORMAL LOW (ref 70–140)
Potassium: 4 mmol/L (ref 3.5–5.1)
Sodium: 141 mmol/L (ref 136–145)
Total Bilirubin: 1 mg/dL (ref 0.2–1.2)
Total Protein: 6.2 g/dL — ABNORMAL LOW (ref 6.4–8.3)

## 2017-03-20 MED ORDER — BORTEZOMIB CHEMO SQ INJECTION 3.5 MG (2.5MG/ML)
1.5000 mg/m2 | Freq: Once | INTRAMUSCULAR | Status: AC
  Administered 2017-03-20: 3.25 mg via SUBCUTANEOUS
  Filled 2017-03-20: qty 3.25

## 2017-03-20 MED ORDER — PROCHLORPERAZINE MALEATE 10 MG PO TABS: 10.0000 mg | ORAL_TABLET | Freq: Once | ORAL | Status: DC

## 2017-03-20 NOTE — Patient Instructions (Signed)
[facility] Discharge Instructions for Patients Receiving Chemotherapy  Today you received the following chemotherapy agent:  Velcade.  To help prevent nausea and vomiting after your treatment, we encourage you to take your nausea medication as directed.   If you develop nausea and vomiting that is not controlled by your nausea medication, call the clinic.   BELOW ARE SYMPTOMS THAT SHOULD BE REPORTED IMMEDIATELY:  *FEVER GREATER THAN 100.5 F  *CHILLS WITH OR WITHOUT FEVER  NAUSEA AND VOMITING THAT IS NOT CONTROLLED WITH YOUR NAUSEA MEDICATION  *UNUSUAL SHORTNESS OF BREATH  *UNUSUAL BRUISING OR BLEEDING  TENDERNESS IN MOUTH AND THROAT WITH OR WITHOUT PRESENCE OF ULCERS  *URINARY PROBLEMS  *BOWEL PROBLEMS  UNUSUAL RASH Items with * indicate a potential emergency and should be followed up as soon as possible.  Feel free to call the clinic should you have any questions or concerns. The clinic phone number is [phone].  Please show the CHEMO ALERT CARD at check-in to the Emergency Department and triage nurse.   

## 2017-03-25 ENCOUNTER — Other Ambulatory Visit: Payer: Self-pay | Admitting: Oncology

## 2017-03-25 DIAGNOSIS — C9000 Multiple myeloma not having achieved remission: Secondary | ICD-10-CM

## 2017-03-25 MED FILL — DEXAMETHASONE 4 MG TABLET: 4 | 84 days supply | Qty: 60 | Fill #2

## 2017-03-25 MED FILL — PROCHLORPERAZINE 10 MG TAB: 10 | 7 days supply | Qty: 30 | Fill #0

## 2017-03-25 MED FILL — ACYCLOVIR 400 MG TABLET: 400 | 30 days supply | Qty: 60 | Fill #0

## 2017-03-27 ENCOUNTER — Inpatient Hospital Stay: Payer: Medicare Other

## 2017-03-27 VITALS — BP 118/79 | HR 70 | Temp 98.2°F | Resp 18

## 2017-03-27 DIAGNOSIS — C9000 Multiple myeloma not having achieved remission: Secondary | ICD-10-CM | POA: Diagnosis not present

## 2017-03-27 DIAGNOSIS — Z5112 Encounter for antineoplastic immunotherapy: Secondary | ICD-10-CM | POA: Diagnosis not present

## 2017-03-27 LAB — CMP (CANCER CENTER ONLY)
ALT: 14 U/L (ref 0–55)
AST: 16 U/L (ref 5–34)
Albumin: 3.7 g/dL (ref 3.5–5.0)
Alkaline Phosphatase: 28 U/L — ABNORMAL LOW (ref 40–150)
Anion gap: 8 (ref 3–11)
BUN: 15 mg/dL (ref 7–26)
CO2: 25 mmol/L (ref 22–29)
Calcium: 9.4 mg/dL (ref 8.4–10.4)
Chloride: 106 mmol/L (ref 98–109)
Creatinine: 0.99 mg/dL (ref 0.70–1.30)
GFR, Est AFR Am: >60 mL/min (ref >60)
GFR, Estimated: >60 mL/min (ref >60)
Glucose, Bld: 92 mg/dL (ref 70–140)
Potassium: 3.9 mmol/L (ref 3.5–5.1)
Sodium: 139 mmol/L (ref 136–145)
Total Bilirubin: 1 mg/dL (ref 0.2–1.2)
Total Protein: 6.3 g/dL — ABNORMAL LOW (ref 6.4–8.3)

## 2017-03-27 LAB — CBC WITH DIFFERENTIAL (CANCER CENTER ONLY)
Basophils Absolute: 0 10*3/uL (ref 0.0–0.1)
Basophils Relative: 1 %
Eosinophils Absolute: 0.4 10*3/uL (ref 0.0–0.5)
Eosinophils Relative: 7 %
HCT: 39.9 % (ref 38.4–49.9)
Hemoglobin: 13.3 g/dL (ref 13.0–17.1)
Lymphocytes Relative: 16 %
Lymphs Abs: 0.9 10*3/uL (ref 0.9–3.3)
MCH: 32.9 pg (ref 27.2–33.4)
MCHC: 33.3 g/dL (ref 32.0–36.0)
MCV: 98.8 fL — ABNORMAL HIGH (ref 79.3–98.0)
Monocytes Absolute: 0.9 10*3/uL (ref 0.1–0.9)
Monocytes Relative: 15 %
Neutro Abs: 3.6 10*3/uL (ref 1.5–6.5)
Neutrophils Relative %: 61 %
Platelet Count: 249 10*3/uL (ref 140–400)
RBC: 4.04 MIL/uL — ABNORMAL LOW (ref 4.20–5.82)
RDW: 14.2 % (ref 11.0–14.6)
WBC Count: 5.8 10*3/uL (ref 4.0–10.3)

## 2017-03-27 MED ORDER — BORTEZOMIB CHEMO SQ INJECTION 3.5 MG (2.5MG/ML)
1.5000 mg/m2 | Freq: Once | INTRAMUSCULAR | Status: AC
  Administered 2017-03-27: 3.25 mg via SUBCUTANEOUS
  Filled 2017-03-27: qty 3.25

## 2017-03-30 DIAGNOSIS — C9000 Multiple myeloma not having achieved remission: Secondary | ICD-10-CM | POA: Diagnosis not present

## 2017-03-31 DIAGNOSIS — Z01818 Encounter for other preprocedural examination: Secondary | ICD-10-CM | POA: Insufficient documentation

## 2017-04-02 ENCOUNTER — Encounter: Payer: Self-pay | Admitting: *Deleted

## 2017-04-03 ENCOUNTER — Inpatient Hospital Stay: Payer: Medicare Other

## 2017-04-03 ENCOUNTER — Inpatient Hospital Stay: Payer: Medicare Other | Attending: Oncology

## 2017-04-03 ENCOUNTER — Other Ambulatory Visit: Payer: Self-pay | Admitting: Oncology

## 2017-04-03 VITALS — BP 139/76 | HR 91 | Temp 98.1°F | Resp 18 | Wt 211.0 lb

## 2017-04-03 DIAGNOSIS — Z5112 Encounter for antineoplastic immunotherapy: Secondary | ICD-10-CM | POA: Diagnosis not present

## 2017-04-03 DIAGNOSIS — C9000 Multiple myeloma not having achieved remission: Secondary | ICD-10-CM | POA: Diagnosis not present

## 2017-04-03 DIAGNOSIS — D649 Anemia, unspecified: Secondary | ICD-10-CM | POA: Insufficient documentation

## 2017-04-03 DIAGNOSIS — J019 Acute sinusitis, unspecified: Secondary | ICD-10-CM | POA: Insufficient documentation

## 2017-04-03 DIAGNOSIS — G629 Polyneuropathy, unspecified: Secondary | ICD-10-CM | POA: Diagnosis not present

## 2017-04-03 LAB — CBC WITH DIFFERENTIAL (CANCER CENTER ONLY)
Basophils Absolute: 0 10*3/uL (ref 0.0–0.1)
Basophils Relative: 1 %
Eosinophils Absolute: 0.6 10*3/uL — ABNORMAL HIGH (ref 0.0–0.5)
Eosinophils Relative: 10 %
HCT: 37.7 % — ABNORMAL LOW (ref 38.4–49.9)
Hemoglobin: 12.7 g/dL — ABNORMAL LOW (ref 13.0–17.1)
Lymphocytes Relative: 8 %
Lymphs Abs: 0.5 10*3/uL — ABNORMAL LOW (ref 0.9–3.3)
MCH: 33.1 pg (ref 27.2–33.4)
MCHC: 33.7 g/dL (ref 32.0–36.0)
MCV: 98.2 fL — ABNORMAL HIGH (ref 79.3–98.0)
Monocytes Absolute: 1.1 10*3/uL — ABNORMAL HIGH (ref 0.1–0.9)
Monocytes Relative: 18 %
Neutro Abs: 3.8 10*3/uL (ref 1.5–6.5)
Neutrophils Relative %: 63 %
Platelet Count: 243 10*3/uL (ref 140–400)
RBC: 3.84 MIL/uL — ABNORMAL LOW (ref 4.20–5.82)
RDW: 14.3 % (ref 11.0–14.6)
WBC Count: 6 10*3/uL (ref 4.0–10.3)

## 2017-04-03 LAB — CMP (CANCER CENTER ONLY)
ALT: 15 U/L (ref 0–55)
AST: 18 U/L (ref 5–34)
Albumin: 3.8 g/dL (ref 3.5–5.0)
Alkaline Phosphatase: 26 U/L — ABNORMAL LOW (ref 40–150)
Anion gap: 8 (ref 3–11)
BUN: 11 mg/dL (ref 7–26)
CO2: 24 mmol/L (ref 22–29)
Calcium: 9.2 mg/dL (ref 8.4–10.4)
Chloride: 105 mmol/L (ref 98–109)
Creatinine: 0.95 mg/dL (ref 0.70–1.30)
GFR, Est AFR Am: >60 mL/min (ref >60)
GFR, Estimated: >60 mL/min (ref >60)
Glucose, Bld: 98 mg/dL (ref 70–140)
Potassium: 3.9 mmol/L (ref 3.5–5.1)
Sodium: 137 mmol/L (ref 136–145)
Total Bilirubin: 0.9 mg/dL (ref 0.2–1.2)
Total Protein: 6.3 g/dL — ABNORMAL LOW (ref 6.4–8.3)

## 2017-04-03 MED ORDER — BORTEZOMIB CHEMO SQ INJECTION 3.5 MG (2.5MG/ML)
1.5000 mg/m2 | Freq: Once | INTRAMUSCULAR | Status: AC
  Administered 2017-04-03: 3.25 mg via SUBCUTANEOUS
  Filled 2017-04-03: qty 3.25

## 2017-04-03 NOTE — Patient Instructions (Signed)
[facility] Discharge Instructions for Patients Receiving Chemotherapy  Today you received the following chemotherapy agent:  Velcade.  To help prevent nausea and vomiting after your treatment, we encourage you to take your nausea medication as directed.   If you develop nausea and vomiting that is not controlled by your nausea medication, call the clinic.   BELOW ARE SYMPTOMS THAT SHOULD BE REPORTED IMMEDIATELY:  *FEVER GREATER THAN 100.5 F  *CHILLS WITH OR WITHOUT FEVER  NAUSEA AND VOMITING THAT IS NOT CONTROLLED WITH YOUR NAUSEA MEDICATION  *UNUSUAL SHORTNESS OF BREATH  *UNUSUAL BRUISING OR BLEEDING  TENDERNESS IN MOUTH AND THROAT WITH OR WITHOUT PRESENCE OF ULCERS  *URINARY PROBLEMS  *BOWEL PROBLEMS  UNUSUAL RASH Items with * indicate a potential emergency and should be followed up as soon as possible.  Feel free to call the clinic should you have any questions or concerns. The clinic phone number is [phone].  Please show the CHEMO ALERT CARD at check-in to the Emergency Department and triage nurse.   

## 2017-04-08 ENCOUNTER — Telehealth: Payer: Self-pay | Admitting: *Deleted

## 2017-04-08 ENCOUNTER — Inpatient Hospital Stay (HOSPITAL_BASED_OUTPATIENT_CLINIC_OR_DEPARTMENT_OTHER): Payer: Medicare Other | Admitting: Medical

## 2017-04-08 ENCOUNTER — Other Ambulatory Visit: Payer: Self-pay | Admitting: Medical

## 2017-04-08 VITALS — BP 130/83 | HR 103 | Temp 99.0°F | Resp 18 | Ht 72.0 in | Wt 209.6 lb

## 2017-04-08 DIAGNOSIS — G629 Polyneuropathy, unspecified: Secondary | ICD-10-CM

## 2017-04-08 DIAGNOSIS — J019 Acute sinusitis, unspecified: Secondary | ICD-10-CM

## 2017-04-08 DIAGNOSIS — C9000 Multiple myeloma not having achieved remission: Secondary | ICD-10-CM

## 2017-04-08 MED ORDER — AZITHROMYCIN 250 MG PO TABS: ORAL_TABLET | ORAL | 0 refills | Status: DC

## 2017-04-08 MED FILL — AZITHROMYCIN 250 MG TABS: 250 | 5 days supply | Qty: 6 | Fill #0

## 2017-04-08 NOTE — Telephone Encounter (Signed)
Returned patient's phone call regarding extreme fatigue, low grade fever last night but is now back to normal, and dragging right leg (this is not a new problem). Per Dr. Alen Blew, it's OK to see Van,NP, today. Patient understands and verbalized understanding.

## 2017-04-08 NOTE — Telephone Encounter (Signed)
"  A medication was to be called in after my visit today but [facility] has no record of it"  Azithromycin order sent to [facility].  "Do not remove them from the pharmacy list.  I use them at my discretion.  I'll pick it up from [facility]."

## 2017-04-08 NOTE — Progress Notes (Signed)
Symptoms Management Clinic Progress Note   Alexander Saunders [id number] [birth year] 67 y.o.  [name] is managed by Dr. Alen Blew  Actively treated with chemotherapy: yes  Current Therapy: Velcade and Revlimid with Zometa   Assessment: Plan:    Acute sinusitis, recurrence not specified, unspecified location - Plan: azithromycin (ZITHROMAX Z-PAK) 250 MG tablet  Neuropathy  Multiple myeloma, remission status unspecified ([place])   Acute sinusitis: Patient was given a prescription for a Z-Pak.   Neuropathy of the lower extremities: Patient is currently on gabapentin 300 mg, 1 q. morning and afternoon and 2 at night.  He was told that he could increase his gabapentin to 300 mg, 2 each morning and evening and 1 in the afternoon initially.  He would then next increase it to 600 mg p.o. 3 times daily if needed.  Multiple myeloma: Mr. Mazariego continues on Revlimid daily.  He is status post cycle 23, day 1 of Velcade which was last dosed on 04/03/2017.  He is scheduled to follow-up for his next cycle of Velcade on 04/10/2017 and will be seen by Dr. Alen Blew on 04/17/2017.  Dr. Alen Blew states that the patient will likely receive 2 additional cycles of Velcade prior to being referred back to the cancer center at [facility] in [place] for consideration of a stem cell transplant.  Please see After Visit Summary for patient specific instructions.  Future Appointments  Date Time Provider [place]  04/10/2017  8:00 AM [facility] LAB 4 [facility] None  04/10/2017  9:00 AM [facility] D11 [facility] None  04/17/2017  9:45 AM [facility] LAB 3 [facility] None  04/17/2017 10:15 AM Shadad, Mathis Dad, MD [facility] None  04/17/2017 11:15 AM [facility] A2 [facility] None  04/24/2017  8:00 AM [facility] LAB 1 [facility] None  04/24/2017  9:00 AM [facility] A2 [facility] None  05/01/2017  8:00 AM [facility] LAB 5 [facility] None  05/01/2017  9:00 AM [facility] A1  [facility] None    No orders of the defined types were placed in this encounter.      Subjective:   Patient ID:  MARSHAL ESKEW is a 67 y.o. (DOB 24-Jun-1950) male.  Chief Complaint:  Chief Complaint  Patient presents with  . Fatigue    HPI Alexander Saunders is a 67 year old male with a diagnosis of multiple myeloma who is treated with Revlimid and Velcade under the care of Dr. Alen Blew.  He is pending 2 additional cycles of Velcade prior to being referred back to [facility] in [place] for consideration of a stem cell transplant.  He presents to the office today with a report of a fever of 101 last evening with chills this morning.  He is having periorbital pressure he reports having a decrease in his energy level.  His wife has had recent signs and symptoms of an upper has a history of sinus infections.  Respiratory tract infection.  His wife reported having at least 1 day of flulike symptoms.  He reports having mild constipation with his last bowel movement on 04/06/2017.  He has noted an increase in neuropathy in his lower legs greater on the right than left.  He continues to take 300 mg of gabapentin in the morning and afternoon and 600 mg at bedtime.  He has a history of dragging his right leg since he had been diagnosed with a plasmacytoma in the thoracic spine with epidural compression between T4 and T6.  He had a surgical resection of this followed  by radiation therapy which she completed on 10/14/2016.  He is noted a mild increase in how he is dragging his right foot over the past week.  He also reports having some swelling in his right foot.   Medications: I have reviewed the patient's current medications.  Allergies: No Known Allergies  Past Medical History:  Diagnosis Date  . 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    Past Surgical History:  Procedure Laterality Date  . APPENDECTOMY    . KNEE ARTHROSCOPY     r/knee  . LAMINECTOMY N/A 09/04/2016    Procedure: THORACIC FOUR-SIX LAMINECTOMY FOR RESECTION OF TUMOR;  Surgeon: Ditty, Kevan Ny, MD;  Location: [place];  Service: Neurosurgery;  Laterality: N/A;    Family History  Problem Relation Age of Onset  . Hypertension Mother   . Cancer Mother   . Hypertension Father     Social History   Socioeconomic History  . Marital status: Married    Spouse name: Not on file  . Number of children: Not on file  . Years of education: Not on file  . Highest education level: Not on file  Social Needs  . Financial resource strain: Not on file  . Food insecurity - worry: Not on file  . Food insecurity - inability: Not on file  . Transportation needs - medical: Not on file  . Transportation needs - non-medical: Not on file  Occupational History  . Not on file  Tobacco Use  . Smoking status: Never Smoker  . Smokeless tobacco: Never Used  Substance and Sexual Activity  . Alcohol use: Yes    Comment: occ  . Drug use: Not on file  . Sexual activity: Not on file  Other Topics Concern  . Not on file  Social History Narrative  . Not on file    Past Medical History, Surgical history, Social history, and Family history were reviewed and updated as appropriate.   Please see review of systems for further details on the patient's review from today.   Review of Systems:  Review of Systems  Constitutional: Positive for chills, fatigue and fever. Negative for diaphoresis.  HENT: Negative for trouble swallowing.   Eyes:       Periorbital pressure.  Respiratory: Negative for cough, choking, chest tightness, shortness of breath and wheezing.   Cardiovascular: Negative for chest pain and palpitations.  Gastrointestinal: Positive for constipation. Negative for diarrhea, nausea and vomiting.  Musculoskeletal: Positive for gait problem.  Neurological: Positive for numbness.    Objective:   Physical Exam:  BP 130/83 (BP Location: Right Arm, Patient Position: Sitting)   Pulse (!) 103  Comment: Beth RN is aware  Temp 99 F (37.2 C) (Oral)   Resp 18   Ht 6' (1.829 m)   Wt 209 lb 9.6 oz (95.1 kg)   SpO2 100%   BMI 28.43 kg/m   ECOG: 1  Physical Exam  Constitutional: No distress.  HENT:  Head: Normocephalic and atraumatic.  Right Ear: External ear normal.  Left Ear: External ear normal.  Mouth/Throat: Oropharynx is clear and moist. No oropharyngeal exudate.  Neck: Normal range of motion.  Cardiovascular: S1 normal and S2 normal. Tachycardia present.  Pulmonary/Chest: Effort normal and breath sounds normal. No respiratory distress. He has no wheezes. He has no rales.  Lymphadenopathy:    He has no cervical adenopathy.  Neurological: He is alert. Coordination (Mr. Lax is ambulating with the use of a cane.) abnormal.  Skin: Skin is warm and dry. No  rash noted. He is not diaphoretic. No erythema.  Psychiatric: He has a normal mood and affect. His behavior is normal. Judgment and thought content normal.    Lab Review:     Component Value Date/Time   NA 137 04/03/2017 0801   NA 137 02/06/2017 0828   K 3.9 04/03/2017 0801   K 4.0 02/06/2017 0828   CL 105 04/03/2017 0801   CO2 24 04/03/2017 0801   CO2 24 02/06/2017 0828   GLUCOSE 98 04/03/2017 0801   GLUCOSE 111 02/06/2017 0828   BUN 11 04/03/2017 0801   BUN 14.9 02/06/2017 0828   CREATININE 0.95 04/03/2017 0801   CREATININE 0.9 02/06/2017 0828   CALCIUM 9.2 04/03/2017 0801   CALCIUM 9.1 02/06/2017 0828   PROT 6.3 (L) 04/03/2017 0801   PROT 6.1 (L) 02/06/2017 0828   ALBUMIN 3.8 04/03/2017 0801   ALBUMIN 3.7 02/06/2017 0828   AST 18 04/03/2017 0801   AST 32 02/06/2017 0828   ALT 15 04/03/2017 0801   ALT 43 02/06/2017 0828   ALKPHOS 26 (L) 04/03/2017 0801   ALKPHOS 31 (L) 02/06/2017 0828   BILITOT 0.9 04/03/2017 0801   BILITOT 0.42 02/06/2017 0828   GFRNONAA >60 04/03/2017 0801   GFRAA >60 04/03/2017 0801       Component Value Date/Time   WBC 6.0 04/03/2017 0801   WBC 5.5 03/06/2017 0822    RBC 3.84 (L) 04/03/2017 0801   HGB 12.6 (L) 03/06/2017 0822   HGB 12.0 (L) 02/06/2017 0828   HCT 37.7 (L) 04/03/2017 0801   HCT 36.2 (L) 02/06/2017 0828   PLT 243 04/03/2017 0801   PLT 258 02/06/2017 0828   MCV 98.2 (H) 04/03/2017 0801   MCV 97.8 02/06/2017 0828   MCH 33.1 04/03/2017 0801   MCHC 33.7 04/03/2017 0801   RDW 14.3 04/03/2017 0801   RDW 15.1 (H) 02/06/2017 0828   LYMPHSABS 0.5 (L) 04/03/2017 0801   LYMPHSABS 0.9 02/06/2017 0828   MONOABS 1.1 (H) 04/03/2017 0801   MONOABS 0.6 02/06/2017 0828   EOSABS 0.6 (H) 04/03/2017 0801   EOSABS 0.2 02/06/2017 0828   BASOSABS 0.0 04/03/2017 0801   BASOSABS 0.0 02/06/2017 0828   -------------------------------  Imaging from last 24 hours (if applicable):  Radiology interpretation: No results found.

## 2017-04-09 ENCOUNTER — Other Ambulatory Visit: Payer: Self-pay | Admitting: *Deleted

## 2017-04-09 DIAGNOSIS — C9000 Multiple myeloma not having achieved remission: Secondary | ICD-10-CM

## 2017-04-09 MED ORDER — LENALIDOMIDE 25 MG PO CAPS: ORAL_CAPSULE | ORAL | 0 refills | Status: DC

## 2017-04-10 ENCOUNTER — Inpatient Hospital Stay: Payer: Medicare Other

## 2017-04-10 DIAGNOSIS — Z5112 Encounter for antineoplastic immunotherapy: Secondary | ICD-10-CM | POA: Diagnosis not present

## 2017-04-10 DIAGNOSIS — G629 Polyneuropathy, unspecified: Secondary | ICD-10-CM | POA: Diagnosis not present

## 2017-04-10 DIAGNOSIS — J019 Acute sinusitis, unspecified: Secondary | ICD-10-CM | POA: Diagnosis not present

## 2017-04-10 DIAGNOSIS — C9000 Multiple myeloma not having achieved remission: Secondary | ICD-10-CM | POA: Diagnosis not present

## 2017-04-10 DIAGNOSIS — D649 Anemia, unspecified: Secondary | ICD-10-CM | POA: Diagnosis not present

## 2017-04-10 LAB — CBC WITH DIFFERENTIAL (CANCER CENTER ONLY)
Basophils Absolute: 0 10*3/uL (ref 0.0–0.1)
Basophils Relative: 1 %
Eosinophils Absolute: 0 10*3/uL (ref 0.0–0.5)
Eosinophils Relative: 0 %
HCT: 37.9 % — ABNORMAL LOW (ref 38.4–49.9)
Hemoglobin: 12.8 g/dL — ABNORMAL LOW (ref 13.0–17.1)
Lymphocytes Relative: 29 %
Lymphs Abs: 1.3 10*3/uL (ref 0.9–3.3)
MCH: 32.3 pg (ref 27.2–33.4)
MCHC: 33.8 g/dL (ref 32.0–36.0)
MCV: 95.7 fL (ref 79.3–98.0)
Monocytes Absolute: 0.2 10*3/uL (ref 0.1–0.9)
Monocytes Relative: 4 %
Neutro Abs: 3 10*3/uL (ref 1.5–6.5)
Neutrophils Relative %: 66 %
Platelet Count: 203 10*3/uL (ref 140–400)
RBC: 3.96 MIL/uL — ABNORMAL LOW (ref 4.20–5.82)
RDW: 13.7 % (ref 11.0–14.6)
WBC Count: 4.5 10*3/uL (ref 4.0–10.3)

## 2017-04-10 LAB — CMP (CANCER CENTER ONLY)
ALT: 51 U/L (ref 0–55)
AST: 56 U/L — ABNORMAL HIGH (ref 5–34)
Albumin: 3.5 g/dL (ref 3.5–5.0)
Alkaline Phosphatase: 32 U/L — ABNORMAL LOW (ref 40–150)
Anion gap: 8 (ref 3–11)
BUN: 11 mg/dL (ref 7–26)
CO2: 25 mmol/L (ref 22–29)
Calcium: 9.6 mg/dL (ref 8.4–10.4)
Chloride: 101 mmol/L (ref 98–109)
Creatinine: 1.02 mg/dL (ref 0.70–1.30)
GFR, Est AFR Am: >60 mL/min (ref >60)
GFR, Estimated: >60 mL/min (ref >60)
Glucose, Bld: 98 mg/dL (ref 70–140)
Potassium: 4 mmol/L (ref 3.5–5.1)
Sodium: 134 mmol/L — ABNORMAL LOW (ref 136–145)
Total Bilirubin: 0.5 mg/dL (ref 0.2–1.2)
Total Protein: 6.3 g/dL — ABNORMAL LOW (ref 6.4–8.3)

## 2017-04-10 NOTE — Progress Notes (Signed)
Pt came back to infusion area complaining of severe generalized fatigue with body aches for past 3 days. Pt states he saw symptom management yesterday and prescribed z-pack. Pt took first dose yesterday afternoon. Pt states he would like to hold treatment due to fatigue and body aches. Pt vitals stable and as charted. Reviewed pt complaints and labs (CBC) with Dr. Alen Blew. Per Dr. Alen Blew ok to hold treatment today and have pt come for follow up appointment next week. Pt to continue to take antibiotics as prescribed. Pt educated on plan of care and fever precautions. Pt aware to call with any complaints. Pt verbalized understanding and had no further questions.

## 2017-04-10 NOTE — Patient Instructions (Signed)

## 2017-04-13 ENCOUNTER — Encounter: Payer: Self-pay | Admitting: *Deleted

## 2017-04-13 LAB — KAPPA/LAMBDA LIGHT CHAINS
Kappa free light chain: 17.9 mg/L (ref 3.3–19.4)
Kappa, lambda light chain ratio: 1.29 (ref 0.26–1.65)
Lambda free light chains: 13.9 mg/L (ref 5.7–26.3)

## 2017-04-15 ENCOUNTER — Other Ambulatory Visit: Payer: Self-pay | Admitting: Oncology

## 2017-04-15 ENCOUNTER — Telehealth: Payer: Self-pay

## 2017-04-15 ENCOUNTER — Encounter: Payer: Self-pay | Admitting: Cardiovascular Disease

## 2017-04-15 DIAGNOSIS — D499 Neoplasm of unspecified behavior of unspecified site: Secondary | ICD-10-CM | POA: Diagnosis not present

## 2017-04-15 DIAGNOSIS — C9000 Multiple myeloma not having achieved remission: Secondary | ICD-10-CM | POA: Diagnosis not present

## 2017-04-15 DIAGNOSIS — Z01810 Encounter for preprocedural cardiovascular examination: Secondary | ICD-10-CM | POA: Diagnosis not present

## 2017-04-15 DIAGNOSIS — Z01818 Encounter for other preprocedural examination: Secondary | ICD-10-CM | POA: Diagnosis not present

## 2017-04-15 DIAGNOSIS — Z08 Encounter for follow-up examination after completed treatment for malignant neoplasm: Secondary | ICD-10-CM | POA: Diagnosis not present

## 2017-04-15 DIAGNOSIS — I071 Rheumatic tricuspid insufficiency: Secondary | ICD-10-CM | POA: Diagnosis not present

## 2017-04-15 LAB — MULTIPLE MYELOMA PANEL, SERUM
Albumin SerPl Elph-Mcnc: 3.4 g/dL (ref 2.9–4.4)
Albumin/Glob SerPl: 1.4 (ref 0.7–1.7)
Alpha 1: 0.2 g/dL (ref 0.0–0.4)
Alpha2 Glob SerPl Elph-Mcnc: 0.7 g/dL (ref 0.4–1.0)
B-Globulin SerPl Elph-Mcnc: 1 g/dL (ref 0.7–1.3)
Gamma Glob SerPl Elph-Mcnc: 0.6 g/dL (ref 0.4–1.8)
Globulin, Total: 2.6 g/dL (ref 2.2–3.9)
IgA: 81 mg/dL (ref 61–437)
IgG (Immunoglobin G), Serum: 685 mg/dL — ABNORMAL LOW (ref 700–1600)
IgM (Immunoglobulin M), Srm: 16 mg/dL — ABNORMAL LOW (ref 20–172)
Total Protein ELP: 6 g/dL (ref 6.0–8.5)

## 2017-04-15 LAB — PULMONARY FUNCTION TEST

## 2017-04-15 NOTE — Telephone Encounter (Signed)
That would be fine. I will place an order for it.

## 2017-04-15 NOTE — Telephone Encounter (Signed)
Called Wells Guiles at [facility] and given below message.

## 2017-04-15 NOTE — Telephone Encounter (Signed)
Alexander Saunders called from [facility] and left message yesterday. Patient has appt today and getting worked up for transplant. He did not do his 24 hour urine for Myeloma proteins. He has appt 3/15 for lab, Dr. Alen Blew and then infusion. [facility] is asking if he could bring the 24 hour urine with his appt on Friday. If it okay, [facility] will fax orders for the 24 hour urine.

## 2017-04-17 ENCOUNTER — Inpatient Hospital Stay (HOSPITAL_BASED_OUTPATIENT_CLINIC_OR_DEPARTMENT_OTHER): Payer: Medicare Other | Admitting: Oncology

## 2017-04-17 ENCOUNTER — Inpatient Hospital Stay: Payer: Medicare Other

## 2017-04-17 ENCOUNTER — Ambulatory Visit: Payer: Medicare Other

## 2017-04-17 ENCOUNTER — Telehealth: Payer: Self-pay | Admitting: Oncology

## 2017-04-17 VITALS — BP 130/76 | HR 76 | Temp 97.8°F | Resp 18 | Ht 72.0 in | Wt 207.5 lb

## 2017-04-17 DIAGNOSIS — D649 Anemia, unspecified: Secondary | ICD-10-CM | POA: Diagnosis not present

## 2017-04-17 DIAGNOSIS — G629 Polyneuropathy, unspecified: Secondary | ICD-10-CM | POA: Diagnosis not present

## 2017-04-17 DIAGNOSIS — J019 Acute sinusitis, unspecified: Secondary | ICD-10-CM | POA: Diagnosis not present

## 2017-04-17 DIAGNOSIS — C9000 Multiple myeloma not having achieved remission: Secondary | ICD-10-CM

## 2017-04-17 DIAGNOSIS — R0789 Other chest pain: Secondary | ICD-10-CM

## 2017-04-17 DIAGNOSIS — Z5112 Encounter for antineoplastic immunotherapy: Secondary | ICD-10-CM | POA: Diagnosis not present

## 2017-04-17 LAB — CBC WITH DIFFERENTIAL (CANCER CENTER ONLY)
Basophils Absolute: 0.1 10*3/uL (ref 0.0–0.1)
Basophils Relative: 1 %
Eosinophils Absolute: 0.1 10*3/uL (ref 0.0–0.5)
Eosinophils Relative: 2 %
HCT: 36.3 % — ABNORMAL LOW (ref 38.4–49.9)
Hemoglobin: 12.3 g/dL — ABNORMAL LOW (ref 13.0–17.1)
Lymphocytes Relative: 20 %
Lymphs Abs: 1 10*3/uL (ref 0.9–3.3)
MCH: 32.2 pg (ref 27.2–33.4)
MCHC: 33.8 g/dL (ref 32.0–36.0)
MCV: 95.2 fL (ref 79.3–98.0)
Monocytes Absolute: 0.9 10*3/uL (ref 0.1–0.9)
Monocytes Relative: 18 %
Neutro Abs: 3.1 10*3/uL (ref 1.5–6.5)
Neutrophils Relative %: 59 %
Platelet Count: 445 10*3/uL — ABNORMAL HIGH (ref 140–400)
RBC: 3.81 MIL/uL — ABNORMAL LOW (ref 4.20–5.82)
RDW: 14.4 % (ref 11.0–14.6)
WBC Count: 5.2 10*3/uL (ref 4.0–10.3)

## 2017-04-17 LAB — CMP (CANCER CENTER ONLY)
ALT: 25 U/L (ref 0–55)
AST: 22 U/L (ref 5–34)
Albumin: 3.5 g/dL (ref 3.5–5.0)
Alkaline Phosphatase: 31 U/L — ABNORMAL LOW (ref 40–150)
Anion gap: 6 (ref 3–11)
BUN: 13 mg/dL (ref 7–26)
CO2: 26 mmol/L (ref 22–29)
Calcium: 9.5 mg/dL (ref 8.4–10.4)
Chloride: 106 mmol/L (ref 98–109)
Creatinine: 0.87 mg/dL (ref 0.70–1.30)
GFR, Est AFR Am: >60 mL/min (ref >60)
GFR, Estimated: >60 mL/min (ref >60)
Glucose, Bld: 90 mg/dL (ref 70–140)
Potassium: 4.1 mmol/L (ref 3.5–5.1)
Sodium: 138 mmol/L (ref 136–145)
Total Bilirubin: 0.4 mg/dL (ref 0.2–1.2)
Total Protein: 6.5 g/dL (ref 6.4–8.3)

## 2017-04-17 NOTE — Telephone Encounter (Signed)
Gave patient AVS and calendar of upcoming April appointments.  °

## 2017-04-17 NOTE — Progress Notes (Signed)
Hematology and Oncology Follow Up Visit  Alexander Saunders [id number] 02/15/1950 67 y.o. 04/17/2017 10:31 AM [name], [name] MDOsei-Bonsu, [name], MD   Principle Diagnosis: 67 year old man with a oligosecretory, kappa light chain multiple myeloma diagnosed in August 2018. 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  Bone marrow biopsy showed 10% plasma myeloma. His cytogenetics showed + 11.    Prior Therapy:  He is status post surgical decompression of an epidural mass on 09/04/2016 and the pathology showed a plasma cell neoplasm.  He is S/P adjuvant radiation therapy to the thoracic spine to be completed on 10/14/2016.  Velcade 1.5 mg/m weekly with dexamethasone 20 mg and Cytoxan 600 mg po. Therapy started on 10/24/2016.  Current therapy:   Revlimid 25 mg total dose daily for 14 days on 1 week off starting on 02/07/2016. Velcade 1.5 mg/m square weekly and dexamethasone 20 mg weekly.  His last Revlimid dose was 04/09/2017 and Velcade and dexamethasone stopped after April 03, 2017.   Interim History:  [name] presents is here for a follow-up.  Since her last visit, chemotherapy has been on hold with his last Revlimid given on 04/09/2017 and his last Velcade and dexamethasone were given on April 03, 2017.  He is currently in preparation to start high-dose chemotherapy and stem cell transplant.  He is scheduled to have mobilization on 04/30/2017 with possible collection on May 04, 2017.  He is anticipated outpatient transplant is on May 15 or Jun 18, 2017.  Since the discontinuation of chemotherapy is a felt better.  His peripheral neuropathy is improved and his mobility has improved as well.  He is ambulating and exercising including walking 2 miles a day.  He is currently on Neurontin which have helped his neuropathy.  He denies any bone pain, back pain or pathological fractures.  He does not report any headaches, blurry vision, syncope or  seizures. He does not report any fevers, chills, sweats or weight loss. He does not report any chest pain, palpitation, orthopnea or leg edema. He does not report any cough, wheezing or hemoptysis. He does not report any nausea, vomiting, abdominal pain, constipation, diarrhea or hematochezia. He does not report any frequency, urgency. He does not report any lymphadenopathy or petechiae. He does not report any pathological fractures.  Denies any psychiatric issues, anxiety or depression.  He denies any skin rashes or lesions.  Remaining review of systems is negative  Medications: I have reviewed the patient's current medications.  Current Outpatient Medications  Medication Sig Dispense Refill  . acyclovir (ZOVIRAX) 400 MG tablet TAKE 1 TABLET (400 MG TOTAL) BY MOUTH 2 TIMES DAILY. 60 tablet 3  . aspirin EC 81 MG tablet Take 81 mg by mouth daily.    [name] azithromycin (ZITHROMAX Z-PAK) 250 MG tablet 2 tab today then 1 daily on days 2 to 5 6 each 0  . dexamethasone (DECADRON) 4 MG tablet Take 5 tablets ('20mg'$ ) by mouth once weekly with breakfast on days of chemotherapy. 60 tablet 3  . gabapentin (NEURONTIN) 300 MG capsule Take on in the morning, on the afternoon and two at night 120 capsule 3  . lenalidomide (REVLIMID) 25 MG capsule Take '25mg'$  by mouth once daily for 14 days on, 7 days off, repeat every 21 days 14 capsule 0  . methocarbamol (ROBAXIN) 750 MG tablet     . polyethylene glycol (MIRALAX / GLYCOLAX) packet Take 17 g by mouth 2 (two) times daily. 14 each 0  .  prochlorperazine (COMPAZINE) 10 MG tablet Take 1 tablet (10 mg total) by mouth every 6 (six) hours as needed for nausea or vomiting. 30 tablet 1  . senna-docusate (SENOKOT-S) 8.6-50 MG tablet Take 1 tablet by mouth 2 (two) times daily. (Patient not taking: Reported on 04/08/2017) 60 tablet 0   No current facility-administered medications for this visit.      Allergies: No Known Allergies  Past Medical History, Surgical history, Social  history, and Family History updated today without any changes.  Physical Exam: Blood pressure 130/76, pulse 76, temperature 97.8 F (36.6 C), temperature source Oral, resp. rate 18, height 6' (1.829 m), weight 207 lb 8 oz (94.1 kg), SpO2 100 %. ECOG: 1 General appearance: Alert, awake gentleman without distress. Head: Without abnormalities.  Atraumatic. Oral mucosa: No thrush or ulcers. Eyes: Sclera anicteric.  Conjunctiva not injected. Lymph nodes: No lymphadenopathy noted in the cervical, supraclavicular or axillary areas. Heart: Regular rate and rhythm without murmurs. Lung: Clear to auscultation without any rhonchi, wheezes or dullness to percussion. Abdomin: Soft, nontender without any rebound or guarding.  No shifting dullness or ascites. Neurological: No motor, sensory deficits.  Relating without any difficulties. Musculoskeletal: No joint deformity or effusion. Skin: Skin rashes or lesions.  No ecchymosis or petechiae..   Lab Results: Lab Results  Component Value Date   WBC 5.2 04/17/2017   HGB 12.6 (L) 03/06/2017   HCT 36.3 (L) 04/17/2017   MCV 95.2 04/17/2017   PLT 445 (H) 04/17/2017     Chemistry      Component Value Date/Time   NA 134 (L) 04/10/2017 0824   NA 137 02/06/2017 0828   K 4.0 04/10/2017 0824   K 4.0 02/06/2017 0828   CL 101 04/10/2017 0824   CO2 25 04/10/2017 0824   CO2 24 02/06/2017 0828   BUN 11 04/10/2017 0824   BUN 14.9 02/06/2017 0828   CREATININE 1.02 04/10/2017 0824   CREATININE 0.9 02/06/2017 0828      Component Value Date/Time   CALCIUM 9.6 04/10/2017 0824   CALCIUM 9.1 02/06/2017 0828   ALKPHOS 32 (L) 04/10/2017 0824   ALKPHOS 31 (L) 02/06/2017 0828   AST 56 (H) 04/10/2017 0824   AST 32 02/06/2017 0828   ALT 51 04/10/2017 0824   ALT 43 02/06/2017 0828   BILITOT 0.5 04/10/2017 0824   BILITOT 0.42 02/06/2017 0828      Results for TEREK, BEE (MRN [id number]) as of 04/17/2017 10:37  Ref. Range 04/10/2017 08:24  Kappa free light  chain Latest Ref Range: 3.3 - 19.4 mg/L 17.9  Lamda free light chains Latest Ref Range: 5.7 - 26.3 mg/L 13.9  Kappa, lamda light chain ratio Latest Ref Range: 0.26 - 1.65  1.29      Impression and Plan:  67 year old gentleman with the following issues:  1.  Oligosecretory,Light chain multiple myeloma diagnosed and July 2018.  He presented with a plasmacytoma and 10% plasma myeloma in the bone marrow.  He received induction chemotherapy with Velcade, Cytoxan and dexamethasone and switched to Velcade, Revlimid and dexamethasone. His last Revlimid dose was on 04/09/2017 and last dexamethasone and Velcade was given on 04/03/2017.  Protein studies obtained on 04/10/2017 and indicate complete normalization of his serum light chains.  The plan is to suspend chemotherapy for the time being until he is done with stem cell mobilization and collection tentatively on 05/04/2017. Chemotherapy will resume for one more cycle at that time in preparation for high-dose chemotherapy and stem cell transplant. The  details of this plan was discussed today with the patient in detail including the rationale behind it.   2.  Plasmacytoma after presenting with epidural compression: no evidence of recurrent disease at this time. He is status post surgery and radiation.  3. Bone health: He received the first of Zometa on 09/26/2016.  Therapy is on hold because of dental complications.  4. Anemia: resolved at this time with hemoglobin within normal range.  5. Prognosis: his performance status remains excellent and aggressive therapy is warranted at this time.  6.  Peripheral neuropathy: improved after stopping Revlimid and Velcad chemotherapy.   7. Follow-up: Will be in April 2019 to resume of chemotherapy for possibly 1-2 cycles.  25 minutes was spent with the patient face-to-face today.  More than 50% of time was dedicated to patient counseling, education and coordination of his multifaceted  care.    Zola Button, MD 3/15/201910:31 AM

## 2017-04-23 ENCOUNTER — Encounter: Payer: Self-pay | Admitting: *Deleted

## 2017-04-23 ENCOUNTER — Encounter: Payer: Self-pay | Admitting: Cardiovascular Disease

## 2017-04-23 ENCOUNTER — Ambulatory Visit (INDEPENDENT_AMBULATORY_CARE_PROVIDER_SITE_OTHER): Payer: Medicare Other | Admitting: Cardiovascular Disease

## 2017-04-23 VITALS — BP 122/80 | HR 72 | Ht 72.0 in | Wt 207.0 lb

## 2017-04-23 DIAGNOSIS — R9431 Abnormal electrocardiogram [ECG] [EKG]: Secondary | ICD-10-CM | POA: Diagnosis not present

## 2017-04-23 NOTE — Patient Instructions (Signed)
Medication Instructions: Your physician recommends that you continue on your current medications as directed. Please refer to the Current Medication list given to you today.   Follow-Up: Your physician recommends that you schedule a follow-up appointment as needed with Dr. [name].   Any Other Special Instructions are listed below:  You have been cleared for bone marrow transplant.

## 2017-04-23 NOTE — Progress Notes (Signed)
04/23/2017 Alexander Saunders   [birth year]  [id number]  Primary Physician Benito Mccreedy, MD Primary Cardiologist: Lorretta Harp MD Lupe Carney, [place]  HPI:  Alexander Saunders is a 67 y.o. fit appearing married African-American male father of 2 with no grandchildren who was referred by [name] because of an abnormal EKG. 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 He has no cardiac risk factors. He saw Dr. Rex Kras remotely because of an abnormal EKG and has had functional testing. Unfortunately has multiple myeloma and is scheduled to have a stem cell transplant. A 2-D echo performed 04/15/17 was normal except for mild concentric LVH. He exercises regularly and walks 2 miles a day with his wife. Does have peripheral neuropathy probably from chemotherapy.    Current Meds  Medication Sig  . acyclovir (ZOVIRAX) 400 MG tablet TAKE 1 TABLET (400 MG TOTAL) BY MOUTH 2 TIMES DAILY.  [name] aspirin EC 81 MG tablet Take 81 mg by mouth daily.  [name] dexamethasone (DECADRON) 4 MG tablet Take 5 tablets ('20mg'$ ) by mouth once weekly with breakfast on days of chemotherapy.  . gabapentin (NEURONTIN) 300 MG capsule Take on in the morning, on the afternoon and two at night  . lenalidomide (REVLIMID) 25 MG capsule Take '25mg'$  by mouth once daily for 14 days on, 7 days off, repeat every 21 days  . methocarbamol (ROBAXIN) 750 MG tablet   . polyethylene glycol (MIRALAX / GLYCOLAX) packet Take 17 g by mouth 2 (two) times daily.  . prochlorperazine (COMPAZINE) 10 MG tablet Take 1 tablet (10 mg total) by mouth every 6 (six) hours as needed for nausea or vomiting.  . senna-docusate (SENOKOT-S) 8.6-50 MG tablet Take 1 tablet by mouth 2 (two) times daily.     No Known Allergies  Social History   Socioeconomic History  . Marital status: Married    Spouse name: Not on file  . Number of children: Not on file  . Years of education: Not on file  . Highest  education level: Not on file  Occupational History  . Not on file  Social Needs  . Financial resource strain: Not on file  . Food insecurity:    Worry: Not on file    Inability: Not on file  . Transportation needs:    Medical: Not on file    Non-medical: Not on file  Tobacco Use  . Smoking status: Never Smoker  . Smokeless tobacco: Never Used  Substance and Sexual Activity  . Alcohol use: Yes    Comment: occ  . Drug use: Not on file  . Sexual activity: Not on file  Lifestyle  . Physical activity:    Days per week: Not on file    Minutes per session: Not on file  . Stress: Not on file  Relationships  . Social connections:    Talks on phone: Not on file    Gets together: Not on file    Attends religious service: Not on file    Active member of club or organization: Not on file    Attends meetings of clubs or organizations: Not on file    Relationship status: Not on file  . Intimate partner violence:    Fear of current or ex partner: Not on file    Emotionally abused: Not on file    Physically abused: Not on file    Forced sexual activity: Not on file  Other  Topics Concern  . Not on file  Social History Narrative  . Not on file     Review of Systems: General: negative for chills, fever, night sweats or weight changes.  Cardiovascular: negative for chest pain, dyspnea on exertion, edema, orthopnea, palpitations, paroxysmal nocturnal dyspnea or shortness of breath Dermatological: negative for rash Respiratory: negative for cough or wheezing Urologic: negative for hematuria Abdominal: negative for nausea, vomiting, diarrhea, bright red blood per rectum, melena, or hematemesis Neurologic: negative for visual changes, syncope, or dizziness All other systems reviewed and are otherwise negative except as noted above.    Blood pressure 122/80, pulse 72, height 6' (1.829 m), weight 207 lb (93.9 kg).  General appearance: alert and no distress Neck: no adenopathy, no  carotid bruit, no JVD, supple, symmetrical, trachea midline and thyroid not enlarged, symmetric, no tenderness/mass/nodules Lungs: clear to auscultation bilaterally Heart: regular rate and rhythm, S1, S2 normal, no murmur, click, rub or gallop Extremities: extremities normal, atraumatic, no cyanosis or edema Pulses: 2+ and symmetric Skin: Skin color, texture, turgor normal. No rashes or lesions Neurologic: Alert and oriented X 3, normal strength and tone. Normal symmetric reflexes. Normal coordination and gait  EKG sinus rhythm at 72 with inferior lateral Q waves. I personally reviewedthis EKG  ASSESSMENT AND PLAN:   Abnormal EKG Mr. Berkovich was referred by [facility] because of an abnormal EKG. He is being considered for stem cell transplant because of myeloma. He is previously a patient of Dr. Dorris Fetch. He had an abnormal EKG for many years.he has had stress tests in the past. He is completely asymptomatic. Exercises regularly and walks 2-3 miles a day. Recent echo performed at [facility] was normal except for mild concentric LVH. His EKG shows inferolateral Q waves. I see no recent to pursue functional testing at this time.      Lorretta Harp MD FACP,FACC,FAHA, [facility] 04/23/2017 12:07 PM

## 2017-04-23 NOTE — Assessment & Plan Note (Signed)
Alexander Saunders was referred by [facility] because of an abnormal EKG. He is being considered for stem cell transplant because of myeloma. He is previously a patient of Dr. Dorris Fetch. He had an abnormal EKG for many years.he has had stress tests in the past. He is completely asymptomatic. Exercises regularly and walks 2-3 miles a day. Recent echo performed at [facility] was normal except for mild concentric LVH. His EKG shows inferolateral Q waves. I see no recent to pursue functional testing at this time.

## 2017-04-24 ENCOUNTER — Ambulatory Visit: Payer: Medicare Other

## 2017-04-24 ENCOUNTER — Other Ambulatory Visit: Payer: Medicare Other

## 2017-04-24 ENCOUNTER — Other Ambulatory Visit: Payer: Self-pay

## 2017-04-24 DIAGNOSIS — C9000 Multiple myeloma not having achieved remission: Secondary | ICD-10-CM

## 2017-04-24 DIAGNOSIS — D649 Anemia, unspecified: Secondary | ICD-10-CM | POA: Diagnosis not present

## 2017-04-24 DIAGNOSIS — J019 Acute sinusitis, unspecified: Secondary | ICD-10-CM | POA: Diagnosis not present

## 2017-04-24 DIAGNOSIS — Z5112 Encounter for antineoplastic immunotherapy: Secondary | ICD-10-CM | POA: Diagnosis not present

## 2017-04-24 DIAGNOSIS — G629 Polyneuropathy, unspecified: Secondary | ICD-10-CM | POA: Diagnosis not present

## 2017-04-27 ENCOUNTER — Encounter: Payer: Self-pay | Admitting: *Deleted

## 2017-04-27 LAB — UPEP/UIFE/LIGHT CHAINS/TP, 24-HR UR
% BETA, Urine: 0 %
ALPHA 1 URINE: 0 %
Albumin, U: 0 %
Alpha 2, Urine: 0 %
Free Kappa Lt Chains,Ur: 3.03 mg/L (ref 1.35–24.19)
Free Kappa/Lambda Ratio: 27.55 — ABNORMAL HIGH (ref 2.04–10.37)
Free Lambda Lt Chains,Ur: 0.11 mg/L — ABNORMAL LOW (ref 0.24–6.66)
GAMMA GLOBULIN URINE: 0 %
Total Protein, Urine-Ur/day: <160 mg/24 hr — ABNORMAL HIGH (ref 30–150)
Total Protein, Urine: <4 mg/dL

## 2017-04-30 DIAGNOSIS — C9000 Multiple myeloma not having achieved remission: Secondary | ICD-10-CM | POA: Diagnosis not present

## 2017-04-30 DIAGNOSIS — Z52011 Autologous donor, stem cells: Secondary | ICD-10-CM | POA: Diagnosis not present

## 2017-05-01 ENCOUNTER — Other Ambulatory Visit: Payer: Medicare Other

## 2017-05-01 ENCOUNTER — Ambulatory Visit: Payer: Medicare Other

## 2017-05-01 DIAGNOSIS — C9000 Multiple myeloma not having achieved remission: Secondary | ICD-10-CM | POA: Diagnosis not present

## 2017-05-01 DIAGNOSIS — Z52011 Autologous donor, stem cells: Secondary | ICD-10-CM | POA: Diagnosis not present

## 2017-05-01 DIAGNOSIS — C9001 Multiple myeloma in remission: Secondary | ICD-10-CM | POA: Diagnosis not present

## 2017-05-01 MED FILL — ACYCLOVIR 400 MG TABLET: 400 | 30 days supply | Qty: 60 | Fill #1

## 2017-05-02 DIAGNOSIS — W19XXXA Unspecified fall, initial encounter: Secondary | ICD-10-CM | POA: Insufficient documentation

## 2017-05-02 DIAGNOSIS — M898X9 Other specified disorders of bone, unspecified site: Secondary | ICD-10-CM | POA: Insufficient documentation

## 2017-05-02 DIAGNOSIS — C9000 Multiple myeloma not having achieved remission: Secondary | ICD-10-CM | POA: Diagnosis not present

## 2017-05-02 DIAGNOSIS — C9001 Multiple myeloma in remission: Secondary | ICD-10-CM | POA: Diagnosis not present

## 2017-05-02 DIAGNOSIS — Z52011 Autologous donor, stem cells: Secondary | ICD-10-CM | POA: Diagnosis not present

## 2017-05-03 DIAGNOSIS — C9000 Multiple myeloma not having achieved remission: Secondary | ICD-10-CM | POA: Diagnosis not present

## 2017-05-03 DIAGNOSIS — Z52011 Autologous donor, stem cells: Secondary | ICD-10-CM | POA: Diagnosis not present

## 2017-05-04 DIAGNOSIS — Z52011 Autologous donor, stem cells: Secondary | ICD-10-CM | POA: Diagnosis not present

## 2017-05-04 DIAGNOSIS — C9000 Multiple myeloma not having achieved remission: Secondary | ICD-10-CM | POA: Diagnosis not present

## 2017-05-05 NOTE — Telephone Encounter (Signed)
Error opening  

## 2017-05-06 ENCOUNTER — Other Ambulatory Visit: Payer: Self-pay | Admitting: *Deleted

## 2017-05-06 DIAGNOSIS — C9000 Multiple myeloma not having achieved remission: Secondary | ICD-10-CM

## 2017-05-06 MED ORDER — LENALIDOMIDE 25 MG PO CAPS: ORAL_CAPSULE | ORAL | 0 refills | Status: DC

## 2017-05-15 ENCOUNTER — Inpatient Hospital Stay: Payer: Medicare Other | Attending: Oncology

## 2017-05-15 ENCOUNTER — Inpatient Hospital Stay: Payer: Medicare Other

## 2017-05-15 ENCOUNTER — Inpatient Hospital Stay (HOSPITAL_BASED_OUTPATIENT_CLINIC_OR_DEPARTMENT_OTHER): Payer: Medicare Other | Admitting: Oncology

## 2017-05-15 ENCOUNTER — Telehealth: Payer: Self-pay

## 2017-05-15 VITALS — BP 129/87 | HR 82 | Temp 97.7°F | Resp 18 | Ht 72.0 in | Wt 207.1 lb

## 2017-05-15 DIAGNOSIS — C9000 Multiple myeloma not having achieved remission: Secondary | ICD-10-CM | POA: Diagnosis not present

## 2017-05-15 DIAGNOSIS — Z5112 Encounter for antineoplastic immunotherapy: Secondary | ICD-10-CM | POA: Diagnosis not present

## 2017-05-15 DIAGNOSIS — J019 Acute sinusitis, unspecified: Secondary | ICD-10-CM | POA: Diagnosis present

## 2017-05-15 LAB — CBC WITH DIFFERENTIAL (CANCER CENTER ONLY)
Basophils Absolute: 0 10*3/uL (ref 0.0–0.1)
Basophils Relative: 1 %
Eosinophils Absolute: 0.3 10*3/uL (ref 0.0–0.5)
Eosinophils Relative: 6 %
HCT: 42.4 % (ref 38.4–49.9)
Hemoglobin: 12.7 g/dL — ABNORMAL LOW (ref 13.0–17.1)
Lymphocytes Relative: 14 %
Lymphs Abs: 0.6 10*3/uL — ABNORMAL LOW (ref 0.9–3.3)
MCH: 32.4 pg (ref 27.2–33.4)
MCHC: 30 g/dL — ABNORMAL LOW (ref 32.0–36.0)
MCV: 108.2 fL — ABNORMAL HIGH (ref 79.3–98.0)
Monocytes Absolute: 0.4 10*3/uL (ref 0.1–0.9)
Monocytes Relative: 11 %
Neutro Abs: 2.9 10*3/uL (ref 1.5–6.5)
Neutrophils Relative %: 68 %
Platelet Count: 199 10*3/uL (ref 140–400)
RBC: 3.92 MIL/uL — ABNORMAL LOW (ref 4.20–5.82)
RDW: 16.6 % — ABNORMAL HIGH (ref 11.0–14.6)
WBC Count: 4.2 10*3/uL (ref 4.0–10.3)

## 2017-05-15 LAB — CMP (CANCER CENTER ONLY)
ALT: 12 U/L (ref 0–55)
AST: 14 U/L (ref 5–34)
Albumin: 3.7 g/dL (ref 3.5–5.0)
Alkaline Phosphatase: 36 U/L — ABNORMAL LOW (ref 40–150)
Anion gap: 7 (ref 3–11)
BUN: 18 mg/dL (ref 7–26)
CO2: 25 mmol/L (ref 22–29)
Calcium: 9.3 mg/dL (ref 8.4–10.4)
Chloride: 108 mmol/L (ref 98–109)
Creatinine: 0.92 mg/dL (ref 0.70–1.30)
GFR, Est AFR Am: >60 mL/min (ref >60)
GFR, Estimated: >60 mL/min (ref >60)
Glucose, Bld: 86 mg/dL (ref 70–140)
Potassium: 4.2 mmol/L (ref 3.5–5.1)
Sodium: 140 mmol/L (ref 136–145)
Total Bilirubin: 0.5 mg/dL (ref 0.2–1.2)
Total Protein: 6.4 g/dL (ref 6.4–8.3)

## 2017-05-15 MED ORDER — PROCHLORPERAZINE MALEATE 10 MG PO TABS
ORAL_TABLET | ORAL | Status: AC
Start: 2017-05-15 — End: ?
  Filled 2017-05-15: qty 1

## 2017-05-15 MED ORDER — PROCHLORPERAZINE MALEATE 10 MG PO TABS: 10.0000 mg | ORAL_TABLET | Freq: Once | ORAL | Status: DC

## 2017-05-15 MED ORDER — BORTEZOMIB CHEMO SQ INJECTION 3.5 MG (2.5MG/ML)
1.5000 mg/m2 | Freq: Once | INTRAMUSCULAR | Status: AC
  Administered 2017-05-15: 3.25 mg via SUBCUTANEOUS
  Filled 2017-05-15: qty 3.25

## 2017-05-15 NOTE — Progress Notes (Signed)
Hematology and Oncology Follow Up Visit  Alexander Saunders [id number] February 23, 1950 68 y.o. 05/15/2017 12:13 PM Saunders, Alexander Saunders MDOsei-Bonsu, Alexander Beard, MD   Principle Diagnosis: 67 year old man with kappa light chain multiple myeloma diagnosed in 2018.  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  Bone marrow biopsy showed 10% plasma myeloma. His cytogenetics showed + 11.    Prior Therapy:  He is status post surgical decompression of an epidural mass on 09/04/2016 and the pathology showed a plasma cell neoplasm.  He is S/P adjuvant radiation therapy to the thoracic spine to be completed on 10/14/2016.  Velcade 1.5 mg/m weekly with dexamethasone 20 mg and Cytoxan 600 mg po. Therapy started on 10/24/2016.  Current therapy:   Revlimid 25 mg total dose daily for 14 days on 1 week off starting on 02/07/2016. Velcade 1.5 mg/m square weekly and dexamethasone 20 mg weekly.    He is under consideration to start high-dose chemotherapy followed by stem cell transplant in May 2019.   Interim History:  Alexander Saunders presents is here for a follow-up.  Since the last visit, he received mobilization utilizing growth factor support and adequate stem cells were collected.  He tolerated the procedure well without any complications.  He did report arthralgias that is manageable with Tylenol.  He does not for any worsening neuropathy or other complications.  Continues to perform activities of daily living without any decline.  He denies any late complications related to Revlimid or Velcade.  He does not report any headaches, blurry vision, syncope or seizures. He does not report any fevers, chills, sweats or weight loss. He does not report any chest pain, palpitation, orthopnea or leg edema. He does not report any cough, wheezing or hemoptysis. He does not report any nausea, vomiting, abdominal pain, constipation, diarrhea or hematochezia. He  does not report any frequency, urgency. He does not report any lymphadenopathy or petechiae.  He denies any skin rashes or lesions.  Remaining review of systems is negative  Medications: I have reviewed the patient's current medications.  Current Outpatient Medications  Medication Sig Dispense Refill  . acyclovir (ZOVIRAX) 400 MG tablet TAKE 1 TABLET (400 MG TOTAL) BY MOUTH 2 TIMES DAILY. 60 tablet 3  . aspirin EC 81 MG tablet Take 81 mg by mouth daily.    [name] dexamethasone (DECADRON) 4 MG tablet Take 5 tablets ('20mg'$ ) by mouth once weekly with breakfast on days of chemotherapy. 60 tablet 3  . gabapentin (NEURONTIN) 300 MG capsule Take on in the morning, on the afternoon and two at night 120 capsule 3  . lenalidomide (REVLIMID) 25 MG capsule Take '25mg'$  by mouth once daily for 14 days on, 7 days off, repeat every 21 days 14 capsule 0  . methocarbamol (ROBAXIN) 750 MG tablet     . polyethylene glycol (MIRALAX / GLYCOLAX) packet Take 17 g by mouth 2 (two) times daily. 14 each 0  . prochlorperazine (COMPAZINE) 10 MG tablet Take 1 tablet (10 mg total) by mouth every 6 (six) hours as needed for nausea or vomiting. 30 tablet 1  . senna-docusate (SENOKOT-S) 8.6-50 MG tablet Take 1 tablet by mouth 2 (two) times daily. 60 tablet 0   No current facility-administered medications for this visit.      Allergies: No Known Allergies  Past Medical History, Surgical history, Social history, and Family History updated today without any changes.  Physical Exam: Blood pressure 129/87, pulse 82, temperature 97.7 F (36.5 C),  temperature source Oral, resp. rate 18, height 6' (1.829 m), weight 207 lb 1.6 oz (93.9 kg), SpO2 100 %. ECOG: 1 General appearance: Well-appearing gentleman without distress. Head: Atraumatic.  No lesions noted. Oral mucosa: Mucous membranes are moist and pink. Eyes: Pupils are equal and round reactive to light. Lymph nodes: No cervical, axillary or supraclavicular lymph node  enlargement. Heart: Regular rate without any murmurs or gallops. Lung: Clear to auscultation in all lung fields.  No wheezes or dullness to percussion. Abdomin: Soft, nondistended without any rebound or guarding. Neurological: No motor, sensory deficits. Musculoskeletal: No skeletal deformity or effusion.  No clubbing or cyanosis. Skin: No petechiae or rash.   Lab Results: Lab Results  Component Value Date   WBC 4.2 05/15/2017   HGB 12.6 (L) 03/06/2017   HCT 42.4 05/15/2017   MCV 108.2 (H) 05/15/2017   PLT 199 05/15/2017     Chemistry      Component Value Date/Time   NA 140 05/15/2017 1101   NA 137 02/06/2017 0828   K 4.2 05/15/2017 1101   K 4.0 02/06/2017 0828   CL 108 05/15/2017 1101   CO2 25 05/15/2017 1101   CO2 24 02/06/2017 0828   BUN 18 05/15/2017 1101   BUN 14.9 02/06/2017 0828   CREATININE 0.92 05/15/2017 1101   CREATININE 0.9 02/06/2017 0828      Component Value Date/Time   CALCIUM 9.3 05/15/2017 1101   CALCIUM 9.1 02/06/2017 0828   ALKPHOS 36 (L) 05/15/2017 1101   ALKPHOS 31 (L) 02/06/2017 0828   AST 14 05/15/2017 1101   AST 32 02/06/2017 0828   ALT 12 05/15/2017 1101   ALT 43 02/06/2017 0828   BILITOT 0.5 05/15/2017 1101   BILITOT 0.42 02/06/2017 0828      Results for Alexander Saunders (MRN [id number]) as of 04/17/2017 10:37  Ref. Range 04/10/2017 08:24  Kappa free light chain Latest Ref Range: 3.3 - 19.4 mg/L 17.9  Lamda free light chains Latest Ref Range: 5.7 - 26.3 mg/L 13.9  Kappa, lamda light chain ratio Latest Ref Range: 0.26 - 1.65  1.29      Impression and Plan:  67 year old gentleman with the following issues:  1. Light chain multiple myeloma diagnosed and July 2018 after presenting with a plasmacytoma and bone marrow involvement with plasma cell neoplasm.    He is currently receiving induction with Velcade, Revlimid and dexamethasone.  He will complete the last cycle of therapy with last Velcade scheduled on 05/22/2017.  He will proceed  with high-dose chemotherapy utilizing melphalan with stem cell transplant after that in May 2019 at [facility].  He will return to follow-up after that for continued care.  2.  Plasmacytoma: Treated with definitive surgery followed by radiation therapy without any recurrence.   3. Bone health: Zometa has been on hold due to dental complications.  4. Follow-up: Will be in July 2019 sooner if needed 2.  25 minutes was spent with the patient face-to-face today.  More than 50% of time was dedicated to patient counseling, education and answering questions regarding his plan of care including the upcoming stem cell transplant.    Zola Button, MD 4/12/201912:13 PM

## 2017-05-15 NOTE — Telephone Encounter (Signed)
Printed avs and calender of upcoming appointment. Per 4/12 los

## 2017-05-15 NOTE — Patient Instructions (Signed)
[facility] Discharge Instructions for Patients Receiving Chemotherapy  Today you received the following chemotherapy agent:  Velcade.  To help prevent nausea and vomiting after your treatment, we encourage you to take your nausea medication as directed.   If you develop nausea and vomiting that is not controlled by your nausea medication, call the clinic.   BELOW ARE SYMPTOMS THAT SHOULD BE REPORTED IMMEDIATELY:  *FEVER GREATER THAN 100.5 F  *CHILLS WITH OR WITHOUT FEVER  NAUSEA AND VOMITING THAT IS NOT CONTROLLED WITH YOUR NAUSEA MEDICATION  *UNUSUAL SHORTNESS OF BREATH  *UNUSUAL BRUISING OR BLEEDING  TENDERNESS IN MOUTH AND THROAT WITH OR WITHOUT PRESENCE OF ULCERS  *URINARY PROBLEMS  *BOWEL PROBLEMS  UNUSUAL RASH Items with * indicate a potential emergency and should be followed up as soon as possible.  Feel free to call the clinic should you have any questions or concerns. The clinic phone number is [phone].  Please show the CHEMO ALERT CARD at check-in to the Emergency Department and triage nurse.   

## 2017-05-19 ENCOUNTER — Telehealth: Payer: Self-pay | Admitting: Oncology

## 2017-05-19 ENCOUNTER — Telehealth: Payer: Self-pay | Admitting: *Deleted

## 2017-05-19 ENCOUNTER — Other Ambulatory Visit: Payer: Self-pay | Admitting: *Deleted

## 2017-05-19 MED ORDER — GABAPENTIN 300 MG PO CAPS: ORAL_CAPSULE | ORAL | 3 refills | Status: DC

## 2017-05-19 NOTE — Telephone Encounter (Signed)
Patient called requesting refill.  Denies health signs or symptoms needing triage at this time.  Call transferred ext. [phone].

## 2017-05-19 NOTE — Telephone Encounter (Signed)
Called patient to verify appointments

## 2017-05-22 ENCOUNTER — Inpatient Hospital Stay: Payer: Medicare Other

## 2017-05-22 VITALS — BP 128/69 | HR 75 | Temp 98.6°F | Resp 18

## 2017-05-22 DIAGNOSIS — C9000 Multiple myeloma not having achieved remission: Secondary | ICD-10-CM | POA: Diagnosis not present

## 2017-05-22 DIAGNOSIS — Z5112 Encounter for antineoplastic immunotherapy: Secondary | ICD-10-CM | POA: Diagnosis not present

## 2017-05-22 LAB — CMP (CANCER CENTER ONLY)
ALT: 14 U/L (ref 0–55)
AST: 13 U/L (ref 5–34)
Albumin: 3.7 g/dL (ref 3.5–5.0)
Alkaline Phosphatase: 29 U/L — ABNORMAL LOW (ref 40–150)
Anion gap: 8 (ref 3–11)
BUN: 17 mg/dL (ref 7–26)
CO2: 24 mmol/L (ref 22–29)
Calcium: 9.3 mg/dL (ref 8.4–10.4)
Chloride: 107 mmol/L (ref 98–109)
Creatinine: 0.91 mg/dL (ref 0.70–1.30)
GFR, Est AFR Am: >60 mL/min (ref >60)
GFR, Estimated: >60 mL/min (ref >60)
Glucose, Bld: 100 mg/dL (ref 70–140)
Potassium: 3.6 mmol/L (ref 3.5–5.1)
Sodium: 139 mmol/L (ref 136–145)
Total Bilirubin: 0.5 mg/dL (ref 0.2–1.2)
Total Protein: 6.2 g/dL — ABNORMAL LOW (ref 6.4–8.3)

## 2017-05-22 LAB — CBC WITH DIFFERENTIAL (CANCER CENTER ONLY)
Basophils Absolute: 0 10*3/uL (ref 0.0–0.1)
Basophils Relative: 0 %
Eosinophils Absolute: 0.4 10*3/uL (ref 0.0–0.5)
Eosinophils Relative: 6 %
HCT: 37.5 % — ABNORMAL LOW (ref 38.4–49.9)
Hemoglobin: 12.6 g/dL — ABNORMAL LOW (ref 13.0–17.1)
Lymphocytes Relative: 8 %
Lymphs Abs: 0.5 10*3/uL — ABNORMAL LOW (ref 0.9–3.3)
MCH: 32 pg (ref 27.2–33.4)
MCHC: 33.5 g/dL (ref 32.0–36.0)
MCV: 95.6 fL (ref 79.3–98.0)
Monocytes Absolute: 0.6 10*3/uL (ref 0.1–0.9)
Monocytes Relative: 11 %
Neutro Abs: 4.3 10*3/uL (ref 1.5–6.5)
Neutrophils Relative %: 75 %
Platelet Count: 304 10*3/uL (ref 140–400)
RBC: 3.92 MIL/uL — ABNORMAL LOW (ref 4.20–5.82)
RDW: 16 % — ABNORMAL HIGH (ref 11.0–14.6)
WBC Count: 5.8 10*3/uL (ref 4.0–10.3)

## 2017-05-22 MED ORDER — PROCHLORPERAZINE MALEATE 10 MG PO TABS: 10.0000 mg | ORAL_TABLET | Freq: Once | ORAL | Status: DC

## 2017-05-22 MED ORDER — BORTEZOMIB CHEMO SQ INJECTION 3.5 MG (2.5MG/ML)
1.5000 mg/m2 | Freq: Once | INTRAMUSCULAR | Status: AC
  Administered 2017-05-22: 3.25 mg via SUBCUTANEOUS
  Filled 2017-05-22: qty 3.25

## 2017-05-22 NOTE — Patient Instructions (Signed)
[facility] Discharge Instructions for Patients Receiving Chemotherapy  Today you received the following chemotherapy agents Velcade.  To help prevent nausea and vomiting after your treatment, we encourage you to take your nausea medication as directed.  If you develop nausea and vomiting that is not controlled by your nausea medication, call the clinic.   BELOW ARE SYMPTOMS THAT SHOULD BE REPORTED IMMEDIATELY:  *FEVER GREATER THAN 100.5 F  *CHILLS WITH OR WITHOUT FEVER  NAUSEA AND VOMITING THAT IS NOT CONTROLLED WITH YOUR NAUSEA MEDICATION  *UNUSUAL SHORTNESS OF BREATH  *UNUSUAL BRUISING OR BLEEDING  TENDERNESS IN MOUTH AND THROAT WITH OR WITHOUT PRESENCE OF ULCERS  *URINARY PROBLEMS  *BOWEL PROBLEMS  UNUSUAL RASH Items with * indicate a potential emergency and should be followed up as soon as possible.  Feel free to call the clinic should you have any questions or concerns. The clinic phone number is [phone].  Please show the CHEMO ALERT CARD at check-in to the Emergency Department and triage nurse.   

## 2017-05-27 ENCOUNTER — Telehealth: Payer: Self-pay | Admitting: *Deleted

## 2017-05-27 ENCOUNTER — Encounter: Payer: Self-pay | Admitting: *Deleted

## 2017-05-27 NOTE — Telephone Encounter (Signed)
Patient states that d/t his treatment regime, he missed several assignments in college. He is requesting a letter be sent to his professor Bobetta Lime, stating such.

## 2017-05-27 NOTE — Telephone Encounter (Signed)
Ok to give him a letter stating this

## 2017-05-27 NOTE — Progress Notes (Unsigned)
Letter for patient left at desk, lm on answering machine for patient.

## 2017-05-29 ENCOUNTER — Other Ambulatory Visit: Payer: Medicare Other

## 2017-05-29 ENCOUNTER — Ambulatory Visit: Payer: Medicare Other

## 2017-06-03 DIAGNOSIS — D72819 Decreased white blood cell count, unspecified: Secondary | ICD-10-CM | POA: Diagnosis not present

## 2017-06-03 DIAGNOSIS — Z01818 Encounter for other preprocedural examination: Secondary | ICD-10-CM | POA: Diagnosis not present

## 2017-06-03 DIAGNOSIS — C9001 Multiple myeloma in remission: Secondary | ICD-10-CM | POA: Diagnosis not present

## 2017-06-03 DIAGNOSIS — C9000 Multiple myeloma not having achieved remission: Secondary | ICD-10-CM | POA: Diagnosis not present

## 2017-06-03 DIAGNOSIS — Z888 Allergy status to other drugs, medicaments and biological substances status: Secondary | ICD-10-CM | POA: Diagnosis not present

## 2017-06-03 DIAGNOSIS — D649 Anemia, unspecified: Secondary | ICD-10-CM | POA: Diagnosis not present

## 2017-06-03 DIAGNOSIS — Z9484 Stem cells transplant status: Secondary | ICD-10-CM

## 2017-06-03 HISTORY — DX: Stem cells transplant status: Z94.84

## 2017-06-04 MED FILL — ACYCLOVIR 400 MG TABLET: 400 | 30 days supply | Qty: 60 | Fill #2

## 2017-06-09 ENCOUNTER — Telehealth: Payer: Self-pay | Admitting: *Deleted

## 2017-06-09 ENCOUNTER — Other Ambulatory Visit: Payer: Self-pay | Admitting: *Deleted

## 2017-06-09 MED ORDER — GABAPENTIN 300 MG PO CAPS: 600.0000 mg | ORAL_CAPSULE | Freq: Three times a day (TID) | ORAL | 3 refills | Status: DC

## 2017-06-09 NOTE — Telephone Encounter (Signed)
patient states he takes 600 mg tid of neurontin and needs refill. Ok per dr Alen Blew. E-scribed to [facility] [address]

## 2017-06-10 ENCOUNTER — Encounter: Payer: Self-pay | Admitting: *Deleted

## 2017-06-12 DIAGNOSIS — C9001 Multiple myeloma in remission: Secondary | ICD-10-CM | POA: Diagnosis not present

## 2017-06-16 DIAGNOSIS — C9001 Multiple myeloma in remission: Secondary | ICD-10-CM | POA: Diagnosis not present

## 2017-06-16 DIAGNOSIS — Z01818 Encounter for other preprocedural examination: Secondary | ICD-10-CM | POA: Diagnosis not present

## 2017-06-16 DIAGNOSIS — C9000 Multiple myeloma not having achieved remission: Secondary | ICD-10-CM | POA: Diagnosis not present

## 2017-06-17 DIAGNOSIS — C9001 Multiple myeloma in remission: Secondary | ICD-10-CM | POA: Diagnosis not present

## 2017-06-17 DIAGNOSIS — C9000 Multiple myeloma not having achieved remission: Secondary | ICD-10-CM | POA: Diagnosis not present

## 2017-06-17 DIAGNOSIS — Z52011 Autologous donor, stem cells: Secondary | ICD-10-CM | POA: Diagnosis not present

## 2017-06-17 DIAGNOSIS — D61810 Antineoplastic chemotherapy induced pancytopenia: Secondary | ICD-10-CM | POA: Diagnosis not present

## 2017-06-18 DIAGNOSIS — C9001 Multiple myeloma in remission: Secondary | ICD-10-CM | POA: Diagnosis not present

## 2017-06-18 DIAGNOSIS — K5900 Constipation, unspecified: Secondary | ICD-10-CM | POA: Diagnosis not present

## 2017-06-18 DIAGNOSIS — D61810 Antineoplastic chemotherapy induced pancytopenia: Secondary | ICD-10-CM | POA: Diagnosis not present

## 2017-06-18 DIAGNOSIS — G629 Polyneuropathy, unspecified: Secondary | ICD-10-CM | POA: Diagnosis not present

## 2017-06-18 DIAGNOSIS — Z79899 Other long term (current) drug therapy: Secondary | ICD-10-CM | POA: Diagnosis not present

## 2017-06-19 DIAGNOSIS — D61810 Antineoplastic chemotherapy induced pancytopenia: Secondary | ICD-10-CM | POA: Diagnosis not present

## 2017-06-19 DIAGNOSIS — C9001 Multiple myeloma in remission: Secondary | ICD-10-CM | POA: Diagnosis not present

## 2017-06-20 DIAGNOSIS — D61810 Antineoplastic chemotherapy induced pancytopenia: Secondary | ICD-10-CM | POA: Diagnosis not present

## 2017-06-20 DIAGNOSIS — K5900 Constipation, unspecified: Secondary | ICD-10-CM | POA: Diagnosis not present

## 2017-06-20 DIAGNOSIS — C9001 Multiple myeloma in remission: Secondary | ICD-10-CM | POA: Diagnosis not present

## 2017-06-21 DIAGNOSIS — R110 Nausea: Secondary | ICD-10-CM | POA: Diagnosis not present

## 2017-06-21 DIAGNOSIS — C9001 Multiple myeloma in remission: Secondary | ICD-10-CM | POA: Diagnosis not present

## 2017-06-21 DIAGNOSIS — D61810 Antineoplastic chemotherapy induced pancytopenia: Secondary | ICD-10-CM | POA: Diagnosis not present

## 2017-06-21 DIAGNOSIS — Z79899 Other long term (current) drug therapy: Secondary | ICD-10-CM | POA: Diagnosis not present

## 2017-06-21 DIAGNOSIS — G629 Polyneuropathy, unspecified: Secondary | ICD-10-CM | POA: Diagnosis not present

## 2017-06-21 DIAGNOSIS — Z9484 Stem cells transplant status: Secondary | ICD-10-CM | POA: Diagnosis not present

## 2017-06-22 DIAGNOSIS — Z52011 Autologous donor, stem cells: Secondary | ICD-10-CM | POA: Diagnosis not present

## 2017-06-22 DIAGNOSIS — C9001 Multiple myeloma in remission: Secondary | ICD-10-CM | POA: Diagnosis not present

## 2017-06-22 DIAGNOSIS — D61810 Antineoplastic chemotherapy induced pancytopenia: Secondary | ICD-10-CM | POA: Diagnosis not present

## 2017-06-23 DIAGNOSIS — C9001 Multiple myeloma in remission: Secondary | ICD-10-CM | POA: Diagnosis not present

## 2017-06-23 DIAGNOSIS — D61810 Antineoplastic chemotherapy induced pancytopenia: Secondary | ICD-10-CM | POA: Diagnosis not present

## 2017-06-23 DIAGNOSIS — G629 Polyneuropathy, unspecified: Secondary | ICD-10-CM | POA: Diagnosis not present

## 2017-06-23 DIAGNOSIS — Z5189 Encounter for other specified aftercare: Secondary | ICD-10-CM | POA: Diagnosis not present

## 2017-06-23 DIAGNOSIS — K5900 Constipation, unspecified: Secondary | ICD-10-CM | POA: Diagnosis not present

## 2017-06-23 DIAGNOSIS — H6123 Impacted cerumen, bilateral: Secondary | ICD-10-CM | POA: Diagnosis not present

## 2017-06-23 DIAGNOSIS — Z79899 Other long term (current) drug therapy: Secondary | ICD-10-CM | POA: Diagnosis not present

## 2017-06-24 DIAGNOSIS — T451X5A Adverse effect of antineoplastic and immunosuppressive drugs, initial encounter: Secondary | ICD-10-CM | POA: Diagnosis not present

## 2017-06-24 DIAGNOSIS — R112 Nausea with vomiting, unspecified: Secondary | ICD-10-CM | POA: Diagnosis not present

## 2017-06-24 DIAGNOSIS — D61810 Antineoplastic chemotherapy induced pancytopenia: Secondary | ICD-10-CM | POA: Diagnosis not present

## 2017-06-24 DIAGNOSIS — Z5189 Encounter for other specified aftercare: Secondary | ICD-10-CM | POA: Diagnosis not present

## 2017-06-24 DIAGNOSIS — Z79899 Other long term (current) drug therapy: Secondary | ICD-10-CM | POA: Diagnosis not present

## 2017-06-24 DIAGNOSIS — C9001 Multiple myeloma in remission: Secondary | ICD-10-CM | POA: Diagnosis not present

## 2017-06-25 DIAGNOSIS — C9001 Multiple myeloma in remission: Secondary | ICD-10-CM | POA: Diagnosis not present

## 2017-06-25 DIAGNOSIS — K5900 Constipation, unspecified: Secondary | ICD-10-CM | POA: Diagnosis not present

## 2017-06-25 DIAGNOSIS — R51 Headache: Secondary | ICD-10-CM | POA: Diagnosis not present

## 2017-06-25 DIAGNOSIS — Z888 Allergy status to other drugs, medicaments and biological substances status: Secondary | ICD-10-CM | POA: Diagnosis not present

## 2017-06-25 DIAGNOSIS — H6120 Impacted cerumen, unspecified ear: Secondary | ICD-10-CM | POA: Diagnosis not present

## 2017-06-25 DIAGNOSIS — Z79899 Other long term (current) drug therapy: Secondary | ICD-10-CM | POA: Diagnosis not present

## 2017-06-25 DIAGNOSIS — Z9484 Stem cells transplant status: Secondary | ICD-10-CM | POA: Insufficient documentation

## 2017-06-25 DIAGNOSIS — D61810 Antineoplastic chemotherapy induced pancytopenia: Secondary | ICD-10-CM | POA: Diagnosis not present

## 2017-06-25 DIAGNOSIS — G629 Polyneuropathy, unspecified: Secondary | ICD-10-CM | POA: Diagnosis not present

## 2017-06-26 DIAGNOSIS — C9001 Multiple myeloma in remission: Secondary | ICD-10-CM | POA: Diagnosis not present

## 2017-06-26 DIAGNOSIS — Z5189 Encounter for other specified aftercare: Secondary | ICD-10-CM | POA: Diagnosis not present

## 2017-06-27 DIAGNOSIS — C9001 Multiple myeloma in remission: Secondary | ICD-10-CM | POA: Diagnosis not present

## 2017-06-27 DIAGNOSIS — Z5189 Encounter for other specified aftercare: Secondary | ICD-10-CM | POA: Diagnosis not present

## 2017-06-28 DIAGNOSIS — R531 Weakness: Secondary | ICD-10-CM | POA: Diagnosis not present

## 2017-06-28 DIAGNOSIS — Z79899 Other long term (current) drug therapy: Secondary | ICD-10-CM | POA: Diagnosis not present

## 2017-06-28 DIAGNOSIS — K5900 Constipation, unspecified: Secondary | ICD-10-CM | POA: Diagnosis not present

## 2017-06-28 DIAGNOSIS — R5383 Other fatigue: Secondary | ICD-10-CM | POA: Diagnosis not present

## 2017-06-28 DIAGNOSIS — R51 Headache: Secondary | ICD-10-CM | POA: Diagnosis not present

## 2017-06-28 DIAGNOSIS — D61810 Antineoplastic chemotherapy induced pancytopenia: Secondary | ICD-10-CM | POA: Diagnosis not present

## 2017-06-28 DIAGNOSIS — C9001 Multiple myeloma in remission: Secondary | ICD-10-CM | POA: Diagnosis not present

## 2017-06-28 DIAGNOSIS — Z9484 Stem cells transplant status: Secondary | ICD-10-CM | POA: Diagnosis not present

## 2017-06-28 DIAGNOSIS — G629 Polyneuropathy, unspecified: Secondary | ICD-10-CM | POA: Diagnosis not present

## 2017-06-28 DIAGNOSIS — H6123 Impacted cerumen, bilateral: Secondary | ICD-10-CM | POA: Diagnosis not present

## 2017-06-28 DIAGNOSIS — Z5189 Encounter for other specified aftercare: Secondary | ICD-10-CM | POA: Diagnosis not present

## 2017-06-29 DIAGNOSIS — Z5189 Encounter for other specified aftercare: Secondary | ICD-10-CM | POA: Diagnosis not present

## 2017-06-29 DIAGNOSIS — D61810 Antineoplastic chemotherapy induced pancytopenia: Secondary | ICD-10-CM | POA: Diagnosis not present

## 2017-06-29 DIAGNOSIS — C9001 Multiple myeloma in remission: Secondary | ICD-10-CM | POA: Diagnosis not present

## 2017-06-29 DIAGNOSIS — Z9484 Stem cells transplant status: Secondary | ICD-10-CM | POA: Diagnosis not present

## 2017-06-30 DIAGNOSIS — Z9484 Stem cells transplant status: Secondary | ICD-10-CM | POA: Diagnosis not present

## 2017-06-30 DIAGNOSIS — C9001 Multiple myeloma in remission: Secondary | ICD-10-CM | POA: Diagnosis not present

## 2017-07-01 ENCOUNTER — Other Ambulatory Visit: Payer: Self-pay | Admitting: *Deleted

## 2017-07-01 DIAGNOSIS — C9000 Multiple myeloma not having achieved remission: Secondary | ICD-10-CM

## 2017-07-02 ENCOUNTER — Telehealth: Payer: Self-pay | Admitting: Oncology

## 2017-07-02 NOTE — Telephone Encounter (Signed)
Scheduled appt per 5/29 sch msg - spoke w/ pt re appts.

## 2017-07-06 ENCOUNTER — Inpatient Hospital Stay: Payer: Medicare Other | Attending: Oncology

## 2017-07-06 DIAGNOSIS — C9000 Multiple myeloma not having achieved remission: Secondary | ICD-10-CM | POA: Diagnosis not present

## 2017-07-06 LAB — CMP (CANCER CENTER ONLY)
ALT: 19 U/L (ref 0–55)
AST: 22 U/L (ref 5–34)
Albumin: 4 g/dL (ref 3.5–5.0)
Alkaline Phosphatase: 31 U/L — ABNORMAL LOW (ref 40–150)
Anion gap: 7 (ref 3–11)
BUN: 13 mg/dL (ref 7–26)
CO2: 26 mmol/L (ref 22–29)
Calcium: 9.5 mg/dL (ref 8.4–10.4)
Chloride: 104 mmol/L (ref 98–109)
Creatinine: 0.83 mg/dL (ref 0.70–1.30)
GFR, Est AFR Am: >60 mL/min (ref >60)
GFR, Estimated: >60 mL/min (ref >60)
Glucose, Bld: 99 mg/dL (ref 70–140)
Potassium: 4.3 mmol/L (ref 3.5–5.1)
Sodium: 137 mmol/L (ref 136–145)
Total Bilirubin: 0.2 mg/dL (ref 0.2–1.2)
Total Protein: 6.4 g/dL (ref 6.4–8.3)

## 2017-07-06 LAB — CBC WITH DIFFERENTIAL (CANCER CENTER ONLY)
Basophils Absolute: 0 10*3/uL (ref 0.0–0.1)
Basophils Relative: 1 %
Eosinophils Absolute: 0 10*3/uL (ref 0.0–0.5)
Eosinophils Relative: 0 %
HCT: 34.7 % — ABNORMAL LOW (ref 38.4–49.9)
Hemoglobin: 11.8 g/dL — ABNORMAL LOW (ref 13.0–17.1)
Lymphocytes Relative: 22 %
Lymphs Abs: 1.1 10*3/uL (ref 0.9–3.3)
MCH: 32.5 pg (ref 27.2–33.4)
MCHC: 34 g/dL (ref 32.0–36.0)
MCV: 95.6 fL (ref 79.3–98.0)
Monocytes Absolute: 0.9 10*3/uL (ref 0.1–0.9)
Monocytes Relative: 18 %
Neutro Abs: 2.9 10*3/uL (ref 1.5–6.5)
Neutrophils Relative %: 59 %
Platelet Count: 252 10*3/uL (ref 140–400)
RBC: 3.63 MIL/uL — ABNORMAL LOW (ref 4.20–5.82)
RDW: 15.1 % — ABNORMAL HIGH (ref 11.0–14.6)
WBC Count: 4.9 10*3/uL (ref 4.0–10.3)

## 2017-07-06 LAB — MAGNESIUM: Magnesium: 1.9 mg/dL (ref 1.7–2.4)

## 2017-07-08 ENCOUNTER — Telehealth: Payer: Self-pay | Admitting: Pharmacist

## 2017-07-08 NOTE — Telephone Encounter (Signed)
Oral Oncology Pharmacist Encounter  Received notification that patient's [employer] authorization number for Revlimid prescription faxed to [facility] will expire on 07/09/17. Chart reviewed, patient +11 day after transplant, no need for Revlimid fill at this time. No action is needed.  Johny Drilling, PharmD, BCPS, BCOP  07/08/2017 11:27 AM Oral Oncology Clinic [phone]

## 2017-07-14 ENCOUNTER — Other Ambulatory Visit: Payer: Self-pay | Admitting: *Deleted

## 2017-07-14 ENCOUNTER — Telehealth: Payer: Self-pay | Admitting: *Deleted

## 2017-07-14 DIAGNOSIS — C9000 Multiple myeloma not having achieved remission: Secondary | ICD-10-CM

## 2017-07-14 MED ORDER — LENALIDOMIDE 25 MG PO CAPS: ORAL_CAPSULE | ORAL | 0 refills | Status: DC

## 2017-07-14 NOTE — Telephone Encounter (Signed)
Call received from Arbie Cookey, patient care advocate with [facility] in reference to [employer] prior authorization for Revlimid order.  Call transferred to ext. [phone] at 1:25 pm to help with this matter.

## 2017-07-15 DIAGNOSIS — Z9484 Stem cells transplant status: Secondary | ICD-10-CM | POA: Diagnosis not present

## 2017-07-15 DIAGNOSIS — C9000 Multiple myeloma not having achieved remission: Secondary | ICD-10-CM | POA: Diagnosis not present

## 2017-07-21 DIAGNOSIS — Z01118 Encounter for examination of ears and hearing with other abnormal findings: Secondary | ICD-10-CM | POA: Diagnosis not present

## 2017-07-21 DIAGNOSIS — D638 Anemia in other chronic diseases classified elsewhere: Secondary | ICD-10-CM | POA: Diagnosis not present

## 2017-07-21 DIAGNOSIS — M545 Low back pain: Secondary | ICD-10-CM | POA: Diagnosis not present

## 2017-07-21 DIAGNOSIS — Z136 Encounter for screening for cardiovascular disorders: Secondary | ICD-10-CM | POA: Diagnosis not present

## 2017-07-21 DIAGNOSIS — Z131 Encounter for screening for diabetes mellitus: Secondary | ICD-10-CM | POA: Diagnosis not present

## 2017-07-21 DIAGNOSIS — G620 Drug-induced polyneuropathy: Secondary | ICD-10-CM | POA: Diagnosis not present

## 2017-07-21 DIAGNOSIS — Z9484 Stem cells transplant status: Secondary | ICD-10-CM | POA: Diagnosis not present

## 2017-07-21 DIAGNOSIS — Z8579 Personal history of other malignant neoplasms of lymphoid, hematopoietic and related tissues: Secondary | ICD-10-CM | POA: Diagnosis not present

## 2017-07-23 ENCOUNTER — Telehealth: Payer: Self-pay | Admitting: Oncology

## 2017-07-23 NOTE — Telephone Encounter (Signed)
Printed medical records for patient on 07/22/17, Release ID: [id number]

## 2017-08-13 ENCOUNTER — Ambulatory Visit: Payer: Medicare Other | Admitting: Oncology

## 2017-08-14 ENCOUNTER — Other Ambulatory Visit: Payer: Medicare Other

## 2017-08-14 ENCOUNTER — Ambulatory Visit: Payer: Medicare Other | Admitting: Oncology

## 2017-08-19 ENCOUNTER — Inpatient Hospital Stay (HOSPITAL_BASED_OUTPATIENT_CLINIC_OR_DEPARTMENT_OTHER): Payer: Medicare Other | Admitting: Oncology

## 2017-08-19 ENCOUNTER — Telehealth: Payer: Self-pay

## 2017-08-19 ENCOUNTER — Inpatient Hospital Stay: Payer: Medicare Other | Attending: Oncology

## 2017-08-19 VITALS — BP 152/77 | HR 81 | Temp 98.2°F | Resp 18 | Ht 72.0 in | Wt 202.0 lb

## 2017-08-19 DIAGNOSIS — C9000 Multiple myeloma not having achieved remission: Secondary | ICD-10-CM

## 2017-08-19 DIAGNOSIS — G629 Polyneuropathy, unspecified: Secondary | ICD-10-CM | POA: Insufficient documentation

## 2017-08-19 LAB — CBC WITH DIFFERENTIAL (CANCER CENTER ONLY)
Basophils Absolute: 0 10*3/uL (ref 0.0–0.1)
Basophils Relative: 1 %
Eosinophils Absolute: 0.8 10*3/uL — ABNORMAL HIGH (ref 0.0–0.5)
Eosinophils Relative: 13 %
HCT: 35.2 % — ABNORMAL LOW (ref 38.4–49.9)
Hemoglobin: 12 g/dL — ABNORMAL LOW (ref 13.0–17.1)
Lymphocytes Relative: 11 %
Lymphs Abs: 0.7 10*3/uL — ABNORMAL LOW (ref 0.9–3.3)
MCH: 33.6 pg — ABNORMAL HIGH (ref 27.2–33.4)
MCHC: 34 g/dL (ref 32.0–36.0)
MCV: 98.9 fL — ABNORMAL HIGH (ref 79.3–98.0)
Monocytes Absolute: 0.7 10*3/uL (ref 0.1–0.9)
Monocytes Relative: 10 %
Neutro Abs: 4.1 10*3/uL (ref 1.5–6.5)
Neutrophils Relative %: 65 %
Platelet Count: 246 10*3/uL (ref 140–400)
RBC: 3.56 MIL/uL — ABNORMAL LOW (ref 4.20–5.82)
RDW: 15.7 % — ABNORMAL HIGH (ref 11.0–14.6)
WBC Count: 6.4 10*3/uL (ref 4.0–10.3)

## 2017-08-19 LAB — CMP (CANCER CENTER ONLY)
ALT: 15 U/L (ref 0–44)
AST: 18 U/L (ref 15–41)
Albumin: 4.1 g/dL (ref 3.5–5.0)
Alkaline Phosphatase: 26 U/L — ABNORMAL LOW (ref 38–126)
Anion gap: 5 (ref 5–15)
BUN: 11 mg/dL (ref 8–23)
CO2: 29 mmol/L (ref 22–32)
Calcium: 9.7 mg/dL (ref 8.9–10.3)
Chloride: 106 mmol/L (ref 98–111)
Creatinine: 0.97 mg/dL (ref 0.61–1.24)
GFR, Est AFR Am: >60 mL/min (ref >60)
GFR, Estimated: >60 mL/min (ref >60)
Glucose, Bld: 95 mg/dL (ref 70–99)
Potassium: 4.1 mmol/L (ref 3.5–5.1)
Sodium: 140 mmol/L (ref 135–145)
Total Bilirubin: 0.3 mg/dL (ref 0.3–1.2)
Total Protein: 6.5 g/dL (ref 6.5–8.1)

## 2017-08-19 NOTE — Progress Notes (Signed)
Hematology and Oncology Follow Up Visit  Alexander Saunders [id number] Apr 23, 1950 67 y.o. 08/19/2017 2:59 PM Alexander Saunders MDOsei-Bonsu, [name], MD   Principle Diagnosis: 67 year old man with multiple myeloma diagnosed in 2018.  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  Bone marrow biopsy showed 10% plasma myeloma. His cytogenetics showed + 11.    Prior Therapy:  He is status post surgical decompression of an epidural mass on 09/04/2016 and the pathology showed a plasma cell neoplasm.  He is S/P adjuvant radiation therapy to the thoracic spine to be completed on 10/14/2016.  Velcade 1.5 mg/m weekly with dexamethasone 20 mg and Cytoxan 600 mg po. Therapy started on 10/24/2016.   High-dose chemotherapy followed by stem cell transplant: Melphalan 200 mg/m2 06/17/17 followed by autologous stem cell rescue 06/18/17      Current therapy: Post transplant care.     Interim History:  Mr. Privitera is here for a follow-up.  Since last visit, he completed autologous stem cell transplant at [facility].  He received melphalan conditioning on Jun 17, 2017 and received stem cell rescue on Jun 18, 2017.  He is recovering reasonably well at this time with improvement in his fatigue and tiredness.  His appetite of also continues to improve after losing 12 pounds he has gained most of it back.  He is ambulating and exercising slowly close to 3 miles a day.  He denies any dizziness or lightheadedness.  He denies any bone pain or pathological fractures.  He continues to have neuropathy in his lower extremities which is unchanged.  He does not report any headaches, blurry vision, syncope or seizures.  He denies any alteration mental status or confusion.  He does not report any fevers, chills, sweats or excessive fatigue.Alexander Saunders He does not report any chest pain, palpitation, orthopnea or leg  edema. He does not report any cough, wheezing or hemoptysis. He does not report any nausea, vomiting, abdominal pain, constipation, diarrhea or hematochezia.  He denies any early satiety or abdominal distention.  He does not report any frequency, urgency. He does not report any lymphadenopathy or petechiae.  He denies any bleeding or clotting tendencies.  He denies any skin rashes or lesions.  Remaining review of systems is negative  Medications: I have reviewed the patient's current medications.  Current Outpatient Medications  Medication Sig Dispense Refill  . acyclovir (ZOVIRAX) 400 MG tablet TAKE 1 TABLET (400 MG TOTAL) BY MOUTH 2 TIMES DAILY. 60 tablet 3  . aspirin EC 81 MG tablet Take 81 mg by mouth daily.    Alexander Saunders dexamethasone (DECADRON) 4 MG tablet Take 5 tablets (55m) by mouth once weekly with breakfast on days of chemotherapy. 60 tablet 3  . gabapentin (NEURONTIN) 300 MG capsule Take 2 capsules (600 mg total) by mouth 3 (three) times daily. Take on in the morning, one in the afternoon and two at night. 180 capsule 3  . lenalidomide (REVLIMID) 25 MG capsule Take 259mby mouth once daily for 14 days on, 7 days off, repeat every 21 days 14 capsule 0  . methocarbamol (ROBAXIN) 750 MG tablet     . polyethylene glycol (MIRALAX / GLYCOLAX) packet Take 17 g by mouth 2 (two) times daily. 14 each 0  . prochlorperazine (COMPAZINE) 10 MG tablet Take 1 tablet (10 mg total) by mouth every 6 (six) hours as needed for nausea or vomiting. 30 tablet 1  . senna-docusate (  SENOKOT-S) 8.6-50 MG tablet Take 1 tablet by mouth 2 (two) times daily. 60 tablet 0   No current facility-administered medications for this visit.      Allergies: No Known Allergies  Past Medical History, Surgical history, Social history, and Family History updated today without any changes.  Physical Exam: Blood pressure (!) 152/77, pulse 81, temperature 98.2 F (36.8 C), temperature source Oral, resp. rate 18, height 6' (1.829 m),  weight 202 lb (91.6 kg), SpO2 99 %.   ECOG: 1 General appearance: Alert, awake gentleman without distress.  Head: Normocephalic without normalities. Oral mucosa: Mucous membranes are moist and pink. Eyes: Sclera anicteric. Lymph nodes: No lymphadenopathy noted in the cervical, axillary or supraclavicular lymph node  Heart: Regular rate without any murmurs or gallops.  No leg edema. Lung: Clear without any rhonchi, wheezes or dullness to percussion. Abdomin: Soft, nontender, nondistended good bowel sounds.  No rebound or guarding. Neurological: No deficits noted motor, sensory and deep tendon reflexes. Musculoskeletal: Full range of motion noted all extremities. Skin: No ecchymosis or petechiae.   Lab Results: Lab Results  Component Value Date   WBC 6.4 08/19/2017   HGB 12.0 (L) 08/19/2017   HCT 35.2 (L) 08/19/2017   MCV 98.9 (H) 08/19/2017   PLT 246 08/19/2017     Chemistry      Component Value Date/Time   NA 137 07/06/2017 1316   NA 137 02/06/2017 0828   K 4.3 07/06/2017 1316   K 4.0 02/06/2017 0828   CL 104 07/06/2017 1316   CO2 26 07/06/2017 1316   CO2 24 02/06/2017 0828   BUN 13 07/06/2017 1316   BUN 14.9 02/06/2017 0828   CREATININE 0.83 07/06/2017 1316   CREATININE 0.9 02/06/2017 0828      Component Value Date/Time   CALCIUM 9.5 07/06/2017 1316   CALCIUM 9.1 02/06/2017 0828   ALKPHOS 31 (L) 07/06/2017 1316   ALKPHOS 31 (L) 02/06/2017 0828   AST 22 07/06/2017 1316   AST 32 02/06/2017 0828   ALT 19 07/06/2017 1316   ALT 43 02/06/2017 0828   BILITOT 0.2 07/06/2017 1316   BILITOT 0.42 02/06/2017 0828           Impression and Plan:  67 year old gentleman with the following issues:  1.  Oligosecretory light chain multiple myeloma diagnosed and July 2018.  He initially presented with a plasmacytoma and bone disease.  He is status post induction chemotherapy outlined above followed by high-dose chemotherapy and stem cell transplant.  He is currently  receiving post transplant care and recovering reasonably well.  The natural course of this disease was reviewed today as well as future treatment options.  At this time he will continue with active surveillance and he will have evaluation of a day 100 posttransplant.  Maintenance therapy with Revlimid may be an option at that time depending on his disease status.  We will continue to follow him closely and he will have an evaluation in September following his day 100 post transplant evaluation.  His laboratory data including CBC today were reviewed and showed complete normalization of his counts.  2.  Plasmacytoma: No evidence of recurrence at this time.  3. Bone health: Zometa has been held in the past and continues to be on hold for the time being.  4.  Neuropathy: Related to his plasmacytoma and prior surgery.  He remains on Neurontin.  5. Follow-up: Will be in September 2019.  15 minutes was spent with the patient face-to-face today.  More  than 50% of time was dedicated to patient counseling, education and discussing the natural course of his disease and future treatment options.    Zola Button, MD 7/17/20192:59 PM

## 2017-08-19 NOTE — Telephone Encounter (Signed)
Printed avs and calender of upcoming appointment. Per 7/17 los 

## 2017-09-30 DIAGNOSIS — C9001 Multiple myeloma in remission: Secondary | ICD-10-CM | POA: Diagnosis not present

## 2017-09-30 DIAGNOSIS — Z9484 Stem cells transplant status: Secondary | ICD-10-CM | POA: Diagnosis not present

## 2017-10-01 ENCOUNTER — Telehealth: Payer: Self-pay

## 2017-10-01 NOTE — Telephone Encounter (Signed)
Called patient to verify appointment. Per 8/29 msg return. Also mailed patient a letter with a calender enclosed

## 2017-10-09 ENCOUNTER — Telehealth: Payer: Self-pay | Admitting: *Deleted

## 2017-10-09 NOTE — Telephone Encounter (Signed)
Received voice mail message from patient stating,"I see Dr. Norma Fredrickson at [facility] on 10/28/17. I'm having labs drawn,CT scan and a bone marrow aspiration. Do I need to see Dr. Alen Blew on 10/29/17? Return number is [phone].

## 2017-10-09 NOTE — Telephone Encounter (Signed)
No. He can cancel and will reschedule later.

## 2017-10-09 NOTE — Telephone Encounter (Signed)
As noted below by Dr. Alen Blew, I informed patient that he doesn't need to see Dr. Alen Blew on 9/26. We will reschedule. He verbalized understanding. Appointments cancelled.

## 2017-10-19 ENCOUNTER — Other Ambulatory Visit: Payer: Self-pay | Admitting: Oncology

## 2017-10-19 DIAGNOSIS — C9000 Multiple myeloma not having achieved remission: Secondary | ICD-10-CM | POA: Diagnosis not present

## 2017-10-20 ENCOUNTER — Inpatient Hospital Stay: Payer: Medicare Other | Attending: Oncology

## 2017-10-20 ENCOUNTER — Other Ambulatory Visit: Payer: Self-pay | Admitting: *Deleted

## 2017-10-20 DIAGNOSIS — C9000 Multiple myeloma not having achieved remission: Secondary | ICD-10-CM

## 2017-10-20 DIAGNOSIS — C7951 Secondary malignant neoplasm of bone: Secondary | ICD-10-CM

## 2017-10-22 LAB — UPEP/UIFE/LIGHT CHAINS/TP, 24-HR UR
% BETA, Urine: 0 %
ALPHA 1 URINE: 0 %
Albumin, U: 100 %
Alpha 2, Urine: 0 %
Free Kappa Lt Chains,Ur: 2.23 mg/L (ref 1.35–24.19)
Free Kappa/Lambda Ratio: 24.78 — ABNORMAL HIGH (ref 2.04–10.37)
Free Lambda Lt Chains,Ur: 0.09 mg/L — ABNORMAL LOW (ref 0.24–6.66)
GAMMA GLOBULIN URINE: 0 %
Total Protein, Urine-Ur/day: 151 mg/24 hr — ABNORMAL HIGH (ref 30–150)
Total Protein, Urine: 5.6 mg/dL
Total Volume: 2700

## 2017-10-28 DIAGNOSIS — N529 Male erectile dysfunction, unspecified: Secondary | ICD-10-CM | POA: Diagnosis not present

## 2017-10-28 DIAGNOSIS — Z9484 Stem cells transplant status: Secondary | ICD-10-CM | POA: Diagnosis not present

## 2017-10-28 DIAGNOSIS — C9000 Multiple myeloma not having achieved remission: Secondary | ICD-10-CM | POA: Diagnosis not present

## 2017-10-28 DIAGNOSIS — Z888 Allergy status to other drugs, medicaments and biological substances status: Secondary | ICD-10-CM | POA: Diagnosis not present

## 2017-10-29 ENCOUNTER — Ambulatory Visit: Payer: Medicare Other | Admitting: Oncology

## 2017-10-29 ENCOUNTER — Other Ambulatory Visit: Payer: Medicare Other

## 2017-11-06 DIAGNOSIS — H15101 Unspecified episcleritis, right eye: Secondary | ICD-10-CM | POA: Diagnosis not present

## 2017-11-06 DIAGNOSIS — H2513 Age-related nuclear cataract, bilateral: Secondary | ICD-10-CM | POA: Diagnosis not present

## 2017-11-23 DIAGNOSIS — Z9484 Stem cells transplant status: Secondary | ICD-10-CM | POA: Diagnosis not present

## 2017-11-23 DIAGNOSIS — D638 Anemia in other chronic diseases classified elsewhere: Secondary | ICD-10-CM | POA: Diagnosis not present

## 2017-11-23 DIAGNOSIS — G620 Drug-induced polyneuropathy: Secondary | ICD-10-CM | POA: Diagnosis not present

## 2017-11-23 DIAGNOSIS — Z8579 Personal history of other malignant neoplasms of lymphoid, hematopoietic and related tissues: Secondary | ICD-10-CM | POA: Diagnosis not present

## 2017-11-23 DIAGNOSIS — M545 Low back pain: Secondary | ICD-10-CM | POA: Diagnosis not present

## 2017-12-07 DIAGNOSIS — G620 Drug-induced polyneuropathy: Secondary | ICD-10-CM | POA: Diagnosis not present

## 2017-12-07 DIAGNOSIS — I739 Peripheral vascular disease, unspecified: Secondary | ICD-10-CM | POA: Diagnosis not present

## 2017-12-07 DIAGNOSIS — D638 Anemia in other chronic diseases classified elsewhere: Secondary | ICD-10-CM | POA: Diagnosis not present

## 2017-12-07 DIAGNOSIS — Z8579 Personal history of other malignant neoplasms of lymphoid, hematopoietic and related tissues: Secondary | ICD-10-CM | POA: Diagnosis not present

## 2017-12-07 DIAGNOSIS — M545 Low back pain: Secondary | ICD-10-CM | POA: Diagnosis not present

## 2017-12-07 DIAGNOSIS — E785 Hyperlipidemia, unspecified: Secondary | ICD-10-CM | POA: Diagnosis not present

## 2017-12-07 DIAGNOSIS — Z9484 Stem cells transplant status: Secondary | ICD-10-CM | POA: Diagnosis not present

## 2017-12-08 ENCOUNTER — Telehealth: Payer: Self-pay | Admitting: Oncology

## 2017-12-08 NOTE — Telephone Encounter (Signed)
Shadad call day 11/6 - per [facility] moved f/u to 2pm. Left message for patient.

## 2017-12-09 ENCOUNTER — Telehealth: Payer: Self-pay | Admitting: Oncology

## 2017-12-09 ENCOUNTER — Inpatient Hospital Stay: Payer: Medicare Other | Attending: Oncology | Admitting: Oncology

## 2017-12-09 VITALS — BP 132/83 | HR 73 | Temp 97.8°F | Resp 17 | Ht 72.0 in | Wt 206.1 lb

## 2017-12-09 DIAGNOSIS — G629 Polyneuropathy, unspecified: Secondary | ICD-10-CM | POA: Diagnosis not present

## 2017-12-09 DIAGNOSIS — C9000 Multiple myeloma not having achieved remission: Secondary | ICD-10-CM

## 2017-12-09 NOTE — Progress Notes (Signed)
Hematology and Oncology Follow Up Visit  Alexander Saunders [id number] 20-Sep-1950 67 y.o. 12/09/2017 3:08 PM Alexander Saunders MDOsei-Bonsu, [name], MD   Principle Diagnosis: 67 year old man with light chain multiple myeloma diagnosed in 2018.  He presented with oligosecretory myeloma and plasmacytoma in thoracic spine.  Bone marrow biopsy showed 10% plasma myeloma. His cytogenetics showed + 11.    Prior Therapy:  He is status post surgical decompression of an epidural mass on 09/04/2016 and the pathology showed a plasma cell neoplasm.  He is S/P adjuvant radiation therapy to the thoracic spine to be completed on 10/14/2016.  Velcade 1.5 mg/m weekly with dexamethasone 20 mg and Cytoxan 600 mg po. Therapy started on 10/24/2016.   High-dose chemotherapy followed by stem cell transplant: Melphalan 200 mg/m2 06/17/17 followed by autologous stem cell rescue 06/18/17.  He achieved a complete response although does have minimal residual disease.      Current therapy: Under consideration for maintenance therapy.     Interim History:  Alexander Saunders returns today for a repeat evaluation.  Since her last visit, he reports no major concerns or complaints.  Continues to have residual neuropathy in his right leg and currently on gabapentin 900 mg 3 times a day.  Despite that, he remains active and continues to workout regularly.  He is continues to attend school without any issues at this time.  He reports his appetite is excellent and he has gained weight.  He denies any residual issues related to transplant.  He does not report any headaches, blurry vision, syncope or seizures.  He denies any changes in his mentation or lethargy.  He does not report any fevers, chills, sweats or excessive fatigue.[name] He does not report any chest pain, palpitation, orthopnea or leg edema. He does not report any cough, wheezing or hemoptysis. He does not report any nausea, vomiting, or early satiety.  He denies any changes  in his bowel habits. He does not report any frequency, urgency. He does not report any ecchymosis or easy bruising.  He denies any bone pain or pathological fractures.  Remaining review of systems is negative  Medications: I have reviewed the patient's current medications.  Current Outpatient Medications  Medication Sig Dispense Refill  . acyclovir (ZOVIRAX) 400 MG tablet TAKE 1 TABLET (400 MG TOTAL) BY MOUTH 2 TIMES DAILY. 60 tablet 3  . aspirin EC 81 MG tablet Take 81 mg by mouth daily.    [name] dexamethasone (DECADRON) 4 MG tablet Take 5 tablets (53m) by mouth once weekly with breakfast on days of chemotherapy. 60 tablet 3  . gabapentin (NEURONTIN) 300 MG capsule Take 2 capsules (600 mg total) by mouth 3 (three) times daily. Take on in the morning, one in the afternoon and two at night. 180 capsule 3  . lenalidomide (REVLIMID) 25 MG capsule Take 266mby mouth once daily for 14 days on, 7 days off, repeat every 21 days 14 capsule 0  . methocarbamol (ROBAXIN) 750 MG tablet     . polyethylene glycol (MIRALAX / GLYCOLAX) packet Take 17 g by mouth 2 (two) times daily. 14 each 0  . prochlorperazine (COMPAZINE) 10 MG tablet Take 1 tablet (10 mg total) by mouth every 6 (six) hours as needed for nausea or vomiting. 30 tablet 1  . senna-docusate (SENOKOT-S) 8.6-50 MG tablet Take 1 tablet by mouth 2 (two) times daily. 60 tablet 0   No current facility-administered medications for this visit.      Allergies: No Known Allergies  Past  Medical History, Surgical history, Social history, and Family History updated today without any changes.  Physical Exam:  Blood pressure 132/83, pulse 73, temperature 97.8 F (36.6 C), temperature source Oral, resp. rate 17, height 6' (1.829 m), weight 206 lb 1.6 oz (93.5 kg), SpO2 100 %.   ECOG: 1   General appearance: Comfortable appearing without any discomfort Head: Normocephalic without any trauma Oropharynx: Mucous membranes are moist and pink without any  thrush or ulcers. Eyes: Pupils are equal and round reactive to light. Lymph nodes: No cervical, supraclavicular, inguinal or axillary lymphadenopathy.   Heart:regular rate and rhythm.  S1 and S2 without leg edema. Lung: Clear without any rhonchi or wheezes.  No dullness to percussion. Abdomin: Soft, nontender, nondistended with good bowel sounds.  No hepatosplenomegaly. Musculoskeletal: No joint deformity or effusion.  Full range of motion noted. Neurological: No deficits noted on motor, sensory and deep tendon reflex exam. Skin: No petechial rash or dryness.  Appeared moist.  noted all extremities. Skin: No ecchymosis or petechiae.   Lab Results: Lab Results  Component Value Date   WBC 6.4 08/19/2017   HGB 12.0 (L) 08/19/2017   HCT 35.2 (L) 08/19/2017   MCV 98.9 (H) 08/19/2017   PLT 246 08/19/2017     Chemistry      Component Value Date/Time   NA 140 08/19/2017 1426   NA 137 02/06/2017 0828   K 4.1 08/19/2017 1426   K 4.0 02/06/2017 0828   CL 106 08/19/2017 1426   CO2 29 08/19/2017 1426   CO2 24 02/06/2017 0828   BUN 11 08/19/2017 1426   BUN 14.9 02/06/2017 0828   CREATININE 0.97 08/19/2017 1426   CREATININE 0.9 02/06/2017 0828      Component Value Date/Time   CALCIUM 9.7 08/19/2017 1426   CALCIUM 9.1 02/06/2017 0828   ALKPHOS 26 (L) 08/19/2017 1426   ALKPHOS 31 (L) 02/06/2017 0828   AST 18 08/19/2017 1426   AST 32 02/06/2017 0828   ALT 15 08/19/2017 1426   ALT 43 02/06/2017 0828   BILITOT 0.3 08/19/2017 1426   BILITOT 0.42 02/06/2017 0828           Impression and Plan:  67 year old man with:  1.  Multiple myeloma diagnosed in July 2018.  He presented with light chain disease and bone involvement and status post the treatment above.  His day 100 evaluation posttransplant showed complete response with positive minimal residual disease.  His bone marrow biopsy showed 1 to 2% plasma cells.  He is currently receiving posttransplant care and has not started  maintenance therapy.  Risks and benefits of restarting Revlimid at a lower dose of 10 mg maintenance for 21 days and a 28-day cycle was discussed today.  Complications including nausea, fatigue, thrombosis and worsening neuropathy was also reviewed.  After discussion today, he has a follow-up with Dr. Norma Fredrickson scheduled for December.  He would like to defer restarting Revlimid at this time till he meets with him at the time.  We will get him started on this medication once he is ready to proceed.   2.  Dental clearance: He has no dental complications at this time.  He is cleared from any dental procedures.  He has cleaning scheduled for next month.  3. Bone health: Risks and benefits of restarting Zometa was reviewed today.  These complications including fatigue, arthralgias as well as infusion related complications.  Anticipate starting in the near future.  He will receive that monthly and will be transition  to every 3 months to complete 2 years of therapy.  4.  Neuropathy: Related to his plasmacytoma and surgery.  He is currently on Neurontin and appears to be manageable at this time.  5. Follow-up: Will be in January 2020 for a repeat evaluation.  15 minutes was spent with the patient face-to-face today.  More than 50% of time was dedicated to reviewing his disease status, treatment options and coordinating plan of care.   Zola Button, MD 11/6/20193:08 PM

## 2017-12-09 NOTE — Telephone Encounter (Signed)
Appts scheduled avs/calendar printed per 11/6 los °

## 2017-12-15 DIAGNOSIS — Z0001 Encounter for general adult medical examination with abnormal findings: Secondary | ICD-10-CM | POA: Diagnosis not present

## 2017-12-15 DIAGNOSIS — G620 Drug-induced polyneuropathy: Secondary | ICD-10-CM | POA: Diagnosis not present

## 2017-12-15 DIAGNOSIS — Z8579 Personal history of other malignant neoplasms of lymphoid, hematopoietic and related tissues: Secondary | ICD-10-CM | POA: Diagnosis not present

## 2017-12-15 DIAGNOSIS — E785 Hyperlipidemia, unspecified: Secondary | ICD-10-CM | POA: Diagnosis not present

## 2017-12-15 DIAGNOSIS — D638 Anemia in other chronic diseases classified elsewhere: Secondary | ICD-10-CM | POA: Diagnosis not present

## 2017-12-15 DIAGNOSIS — Z2821 Immunization not carried out because of patient refusal: Secondary | ICD-10-CM | POA: Diagnosis not present

## 2017-12-15 DIAGNOSIS — Z9484 Stem cells transplant status: Secondary | ICD-10-CM | POA: Diagnosis not present

## 2017-12-18 ENCOUNTER — Other Ambulatory Visit: Payer: Self-pay | Admitting: Oncology

## 2017-12-18 ENCOUNTER — Inpatient Hospital Stay: Payer: Medicare Other

## 2017-12-18 VITALS — BP 130/88 | HR 90 | Temp 98.3°F | Resp 16

## 2017-12-18 DIAGNOSIS — C9000 Multiple myeloma not having achieved remission: Secondary | ICD-10-CM

## 2017-12-18 DIAGNOSIS — G629 Polyneuropathy, unspecified: Secondary | ICD-10-CM | POA: Diagnosis not present

## 2017-12-18 LAB — CBC WITH DIFFERENTIAL (CANCER CENTER ONLY)
Abs Immature Granulocytes: 0.02 10*3/uL (ref 0.00–0.07)
Basophils Absolute: 0 10*3/uL (ref 0.0–0.1)
Basophils Relative: 1 %
Eosinophils Absolute: 0.4 10*3/uL (ref 0.0–0.5)
Eosinophils Relative: 7 %
HCT: 39 % (ref 39.0–52.0)
Hemoglobin: 12.8 g/dL — ABNORMAL LOW (ref 13.0–17.0)
Immature Granulocytes: 0 %
Lymphocytes Relative: 14 %
Lymphs Abs: 0.8 10*3/uL (ref 0.7–4.0)
MCH: 32.1 pg (ref 26.0–34.0)
MCHC: 32.8 g/dL (ref 30.0–36.0)
MCV: 97.7 fL (ref 80.0–100.0)
Monocytes Absolute: 0.6 10*3/uL (ref 0.1–1.0)
Monocytes Relative: 11 %
Neutro Abs: 3.7 10*3/uL (ref 1.7–7.7)
Neutrophils Relative %: 67 %
Platelet Count: 231 10*3/uL (ref 150–400)
RBC: 3.99 MIL/uL — ABNORMAL LOW (ref 4.22–5.81)
RDW: 13.4 % (ref 11.5–15.5)
WBC Count: 5.5 10*3/uL (ref 4.0–10.5)
nRBC: 0 % (ref 0.0–0.2)

## 2017-12-18 LAB — CMP (CANCER CENTER ONLY)
ALT: 14 U/L (ref 0–44)
AST: 20 U/L (ref 15–41)
Albumin: 3.9 g/dL (ref 3.5–5.0)
Alkaline Phosphatase: 29 U/L — ABNORMAL LOW (ref 38–126)
Anion gap: 9 (ref 5–15)
BUN: 13 mg/dL (ref 8–23)
CO2: 25 mmol/L (ref 22–32)
Calcium: 9.7 mg/dL (ref 8.9–10.3)
Chloride: 108 mmol/L (ref 98–111)
Creatinine: 0.87 mg/dL (ref 0.61–1.24)
GFR, Est AFR Am: >60 mL/min (ref >60)
GFR, Estimated: >60 mL/min (ref >60)
Glucose, Bld: 100 mg/dL — ABNORMAL HIGH (ref 70–99)
Potassium: 4.1 mmol/L (ref 3.5–5.1)
Sodium: 142 mmol/L (ref 135–145)
Total Bilirubin: 0.3 mg/dL (ref 0.3–1.2)
Total Protein: 6.8 g/dL (ref 6.5–8.1)

## 2017-12-18 MED ORDER — ZOLEDRONIC ACID 4 MG/100ML IV SOLN
4.0000 mg | Freq: Once | INTRAVENOUS | Status: AC
  Administered 2017-12-18: 4 mg via INTRAVENOUS
  Filled 2017-12-18: qty 100

## 2017-12-18 MED ORDER — SODIUM CHLORIDE 0.9 % IV SOLN
Freq: Once | INTRAVENOUS | Status: AC
  Administered 2017-12-18: 09:00:00 via INTRAVENOUS
  Filled 2017-12-18: qty 250

## 2017-12-18 NOTE — Patient Instructions (Signed)

## 2018-01-04 DIAGNOSIS — Z7983 Long term (current) use of bisphosphonates: Secondary | ICD-10-CM | POA: Diagnosis not present

## 2018-01-04 DIAGNOSIS — G620 Drug-induced polyneuropathy: Secondary | ICD-10-CM | POA: Diagnosis not present

## 2018-01-04 DIAGNOSIS — Z9484 Stem cells transplant status: Secondary | ICD-10-CM | POA: Diagnosis not present

## 2018-01-04 DIAGNOSIS — G629 Polyneuropathy, unspecified: Secondary | ICD-10-CM | POA: Diagnosis not present

## 2018-01-04 DIAGNOSIS — C9001 Multiple myeloma in remission: Secondary | ICD-10-CM | POA: Diagnosis not present

## 2018-01-04 DIAGNOSIS — Z79899 Other long term (current) drug therapy: Secondary | ICD-10-CM | POA: Diagnosis not present

## 2018-01-04 DIAGNOSIS — Z23 Encounter for immunization: Secondary | ICD-10-CM | POA: Diagnosis not present

## 2018-01-05 ENCOUNTER — Other Ambulatory Visit: Payer: Self-pay | Admitting: Oncology

## 2018-01-05 ENCOUNTER — Telehealth: Payer: Self-pay | Admitting: *Deleted

## 2018-01-05 DIAGNOSIS — G620 Drug-induced polyneuropathy: Secondary | ICD-10-CM | POA: Insufficient documentation

## 2018-01-05 MED ORDER — LENALIDOMIDE 10 MG PO CAPS: 10.0000 mg | ORAL_CAPSULE | Freq: Every day | ORAL | 0 refills | Status: DC

## 2018-01-05 NOTE — Telephone Encounter (Signed)
Faxed new script for revlimid 10mg  tablets to [facility]

## 2018-01-07 DIAGNOSIS — M5417 Radiculopathy, lumbosacral region: Secondary | ICD-10-CM | POA: Diagnosis not present

## 2018-01-07 DIAGNOSIS — D638 Anemia in other chronic diseases classified elsewhere: Secondary | ICD-10-CM | POA: Diagnosis not present

## 2018-01-07 DIAGNOSIS — G620 Drug-induced polyneuropathy: Secondary | ICD-10-CM | POA: Diagnosis not present

## 2018-01-07 DIAGNOSIS — N528 Other male erectile dysfunction: Secondary | ICD-10-CM | POA: Diagnosis not present

## 2018-01-07 DIAGNOSIS — Z8579 Personal history of other malignant neoplasms of lymphoid, hematopoietic and related tissues: Secondary | ICD-10-CM | POA: Diagnosis not present

## 2018-01-07 DIAGNOSIS — Z9484 Stem cells transplant status: Secondary | ICD-10-CM | POA: Diagnosis not present

## 2018-01-07 DIAGNOSIS — E785 Hyperlipidemia, unspecified: Secondary | ICD-10-CM | POA: Diagnosis not present

## 2018-01-15 ENCOUNTER — Other Ambulatory Visit: Payer: Self-pay

## 2018-01-15 ENCOUNTER — Inpatient Hospital Stay: Payer: Medicare Other | Attending: Oncology

## 2018-01-15 ENCOUNTER — Inpatient Hospital Stay: Payer: Medicare Other

## 2018-01-15 VITALS — BP 137/84 | HR 79 | Temp 98.4°F | Resp 17

## 2018-01-15 DIAGNOSIS — C9000 Multiple myeloma not having achieved remission: Secondary | ICD-10-CM | POA: Insufficient documentation

## 2018-01-15 LAB — CMP (CANCER CENTER ONLY)
ALT: 11 U/L (ref 0–44)
AST: 17 U/L (ref 15–41)
Albumin: 3.9 g/dL (ref 3.5–5.0)
Alkaline Phosphatase: 36 U/L — ABNORMAL LOW (ref 38–126)
Anion gap: 9 (ref 5–15)
BUN: 12 mg/dL (ref 8–23)
CO2: 22 mmol/L (ref 22–32)
Calcium: 9.9 mg/dL (ref 8.9–10.3)
Chloride: 109 mmol/L (ref 98–111)
Creatinine: 0.92 mg/dL (ref 0.61–1.24)
GFR, Est AFR Am: >60 mL/min (ref >60)
GFR, Estimated: >60 mL/min (ref >60)
Glucose, Bld: 95 mg/dL (ref 70–99)
Potassium: 4.3 mmol/L (ref 3.5–5.1)
Sodium: 140 mmol/L (ref 135–145)
Total Bilirubin: 0.5 mg/dL (ref 0.3–1.2)
Total Protein: 7 g/dL (ref 6.5–8.1)

## 2018-01-15 LAB — CBC WITH DIFFERENTIAL (CANCER CENTER ONLY)
Abs Immature Granulocytes: 0.04 10*3/uL (ref 0.00–0.07)
Basophils Absolute: 0 10*3/uL (ref 0.0–0.1)
Basophils Relative: 1 %
Eosinophils Absolute: 0.4 10*3/uL (ref 0.0–0.5)
Eosinophils Relative: 5 %
HCT: 38.7 % — ABNORMAL LOW (ref 39.0–52.0)
Hemoglobin: 13.2 g/dL (ref 13.0–17.0)
Immature Granulocytes: 1 %
Lymphocytes Relative: 17 %
Lymphs Abs: 1.1 10*3/uL (ref 0.7–4.0)
MCH: 32 pg (ref 26.0–34.0)
MCHC: 34.1 g/dL (ref 30.0–36.0)
MCV: 93.9 fL (ref 80.0–100.0)
Monocytes Absolute: 0.8 10*3/uL (ref 0.1–1.0)
Monocytes Relative: 12 %
Neutro Abs: 4.3 10*3/uL (ref 1.7–7.7)
Neutrophils Relative %: 64 %
Platelet Count: 245 10*3/uL (ref 150–400)
RBC: 4.12 MIL/uL — ABNORMAL LOW (ref 4.22–5.81)
RDW: 13.3 % (ref 11.5–15.5)
WBC Count: 6.6 10*3/uL (ref 4.0–10.5)
nRBC: 0 % (ref 0.0–0.2)

## 2018-01-15 MED ORDER — ZOLEDRONIC ACID 4 MG/100ML IV SOLN
4.0000 mg | Freq: Once | INTRAVENOUS | Status: AC
  Administered 2018-01-15: 4 mg via INTRAVENOUS
  Filled 2018-01-15: qty 100

## 2018-01-15 MED ORDER — GABAPENTIN 300 MG PO CAPS: 900.0000 mg | ORAL_CAPSULE | Freq: Three times a day (TID) | ORAL | 3 refills | Status: DC

## 2018-01-15 MED ORDER — LENALIDOMIDE 10 MG PO CAPS: 10.0000 mg | ORAL_CAPSULE | Freq: Every day | ORAL | 0 refills | Status: DC

## 2018-01-15 MED ORDER — SODIUM CHLORIDE 0.9 % IV SOLN
Freq: Once | INTRAVENOUS | Status: AC
  Administered 2018-01-15: 09:00:00 via INTRAVENOUS
  Filled 2018-01-15: qty 250

## 2018-01-15 NOTE — Patient Instructions (Signed)

## 2018-02-01 DIAGNOSIS — C9000 Multiple myeloma not having achieved remission: Secondary | ICD-10-CM | POA: Diagnosis not present

## 2018-02-01 DIAGNOSIS — M4714 Other spondylosis with myelopathy, thoracic region: Secondary | ICD-10-CM | POA: Diagnosis not present

## 2018-02-01 DIAGNOSIS — G629 Polyneuropathy, unspecified: Secondary | ICD-10-CM | POA: Diagnosis not present

## 2018-02-01 DIAGNOSIS — M545 Low back pain: Secondary | ICD-10-CM | POA: Diagnosis not present

## 2018-02-09 ENCOUNTER — Encounter: Payer: Self-pay | Admitting: Neurology

## 2018-02-10 ENCOUNTER — Inpatient Hospital Stay: Payer: Medicare Other

## 2018-02-10 ENCOUNTER — Inpatient Hospital Stay (HOSPITAL_BASED_OUTPATIENT_CLINIC_OR_DEPARTMENT_OTHER): Payer: Medicare Other | Admitting: Oncology

## 2018-02-10 ENCOUNTER — Other Ambulatory Visit: Payer: Self-pay

## 2018-02-10 ENCOUNTER — Inpatient Hospital Stay: Payer: Medicare Other | Attending: Oncology

## 2018-02-10 ENCOUNTER — Telehealth: Payer: Self-pay | Admitting: Oncology

## 2018-02-10 DIAGNOSIS — Z923 Personal history of irradiation: Secondary | ICD-10-CM | POA: Insufficient documentation

## 2018-02-10 DIAGNOSIS — C9000 Multiple myeloma not having achieved remission: Secondary | ICD-10-CM

## 2018-02-10 DIAGNOSIS — Z79899 Other long term (current) drug therapy: Secondary | ICD-10-CM | POA: Diagnosis not present

## 2018-02-10 LAB — CMP (CANCER CENTER ONLY)
ALT: 7 U/L (ref 0–44)
AST: 16 U/L (ref 15–41)
Albumin: 4 g/dL (ref 3.5–5.0)
Alkaline Phosphatase: 41 U/L (ref 38–126)
Anion gap: 7 (ref 5–15)
BUN: 16 mg/dL (ref 8–23)
CO2: 24 mmol/L (ref 22–32)
Calcium: 9.2 mg/dL (ref 8.9–10.3)
Chloride: 108 mmol/L (ref 98–111)
Creatinine: 1.07 mg/dL (ref 0.61–1.24)
GFR, Est AFR Am: >60 mL/min (ref >60)
GFR, Estimated: >60 mL/min (ref >60)
Glucose, Bld: 104 mg/dL — ABNORMAL HIGH (ref 70–99)
Potassium: 3.9 mmol/L (ref 3.5–5.1)
Sodium: 139 mmol/L (ref 135–145)
Total Bilirubin: 0.5 mg/dL (ref 0.3–1.2)
Total Protein: 7.2 g/dL (ref 6.5–8.1)

## 2018-02-10 LAB — CBC WITH DIFFERENTIAL (CANCER CENTER ONLY)
Abs Immature Granulocytes: 0.03 10*3/uL (ref 0.00–0.07)
Basophils Absolute: 0 10*3/uL (ref 0.0–0.1)
Basophils Relative: 0 %
Eosinophils Absolute: 0.7 10*3/uL — ABNORMAL HIGH (ref 0.0–0.5)
Eosinophils Relative: 9 %
HCT: 37.6 % — ABNORMAL LOW (ref 39.0–52.0)
Hemoglobin: 12.8 g/dL — ABNORMAL LOW (ref 13.0–17.0)
Immature Granulocytes: 0 %
Lymphocytes Relative: 14 %
Lymphs Abs: 1.2 10*3/uL (ref 0.7–4.0)
MCH: 31.8 pg (ref 26.0–34.0)
MCHC: 34 g/dL (ref 30.0–36.0)
MCV: 93.5 fL (ref 80.0–100.0)
Monocytes Absolute: 0.9 10*3/uL (ref 0.1–1.0)
Monocytes Relative: 11 %
Neutro Abs: 5.3 10*3/uL (ref 1.7–7.7)
Neutrophils Relative %: 66 %
Platelet Count: 280 10*3/uL (ref 150–400)
RBC: 4.02 MIL/uL — ABNORMAL LOW (ref 4.22–5.81)
RDW: 13.2 % (ref 11.5–15.5)
WBC Count: 8.1 10*3/uL (ref 4.0–10.5)
nRBC: 0 % (ref 0.0–0.2)

## 2018-02-10 MED ORDER — ACYCLOVIR 400 MG PO TABS: 400.0000 mg | ORAL_TABLET | Freq: Two times a day (BID) | ORAL | 3 refills | Status: DC

## 2018-02-10 MED ORDER — ZOLEDRONIC ACID 4 MG/100ML IV SOLN
4.0000 mg | Freq: Once | INTRAVENOUS | Status: AC
  Administered 2018-02-10: 4 mg via INTRAVENOUS
  Filled 2018-02-10: qty 100

## 2018-02-10 MED ORDER — SODIUM CHLORIDE 0.9 % IV SOLN
Freq: Once | INTRAVENOUS | Status: AC
  Administered 2018-02-10: 14:00:00 via INTRAVENOUS
  Filled 2018-02-10: qty 250

## 2018-02-10 MED ORDER — LENALIDOMIDE 10 MG PO CAPS: 10.0000 mg | ORAL_CAPSULE | Freq: Every day | ORAL | 0 refills | Status: DC

## 2018-02-10 NOTE — Progress Notes (Addendum)
Hematology and Oncology Follow Up Visit  TAJI BARRETTO [id number] [birth year] 68 y.o. 02/10/2018 1:57 PM [name], [name] MDOsei-Bonsu, [name], MD   Principle Diagnosis: 68 year old man with multiple myeloma diagnosed after presenting with plasmacytoma and 10% plasma involvement in the bone marrow in 2018.  His cytogenetics showed a 11+ abnormality.  Prior Therapy:  He is status post surgical decompression of an epidural mass on 09/04/2016 and the pathology showed a plasma cell neoplasm.  He is S/P adjuvant radiation therapy to the thoracic spine to be completed on 10/14/2016.  Velcade 1.5 mg/m weekly with dexamethasone 20 mg and Cytoxan 600 mg po. Therapy started on 10/24/2016.   High-dose chemotherapy followed by stem cell transplant: Melphalan 200 mg/m2 06/17/17 followed by autologous stem cell rescue 06/18/17.  He achieved a complete response although does have minimal residual disease.      Current therapy:   Zometa 4 mg every 3 months.  Revlimid 10 mg daily for 21 days out of a 28-day cycle tentatively to start in the near future.     Interim History:  Mr. Allinson is here for a follow-up visit.  Since last visit, he reports no major changes in his health.  Continues to exercise regularly without any decline in his ability to do so.  He has not started Revlimid at this time but planning to in the near future.  His appetite and performance status remain excellent.  His quality of life is unchanged.  He denies any worsening back pain or recurrent infections.  His peripheral neuropathy remains manageable at the current doses of gabapentin.  He does not report any headaches, blurry vision, syncope or seizures.  He denies any alteration mental status or lethargy.  He does not report any fevers, chills, sweats or excessive fatigue. He does not report any chest pain, palpitation, orthopnea or leg edema. He does not report any cough, wheezing or hemoptysis. He does not report any  nausea, vomiting, or abdominal distention.  He denies any depression or diarrhea.  He does not report any frequency, urgency. He does not report any bleeding or clotting tendency.  He denies any arthralgias or myalgias.  Denies any heat or cold intolerance.  Denies any anxiety or depression.  Remaining review of systems is negative  Medications: I have reviewed the patient's current medications.  Current Outpatient Medications  Medication Sig Dispense Refill  . acyclovir (ZOVIRAX) 400 MG tablet Take 1 tablet (400 mg total) by mouth 2 (two) times daily. 60 tablet 3  . aspirin EC 81 MG tablet Take 81 mg by mouth daily.    [name] dexamethasone (DECADRON) 4 MG tablet Take 5 tablets ('20mg'$ ) by mouth once weekly with breakfast on days of chemotherapy. 60 tablet 3  . gabapentin (NEURONTIN) 300 MG capsule Take 3 capsules (900 mg total) by mouth 3 (three) times daily. Take on in the morning, one in the afternoon and two at night. 180 capsule 3  . lenalidomide (REVLIMID) 10 MG capsule Take 1 capsule (10 mg total) by mouth daily. 21 capsule 0  . methocarbamol (ROBAXIN) 750 MG tablet     . polyethylene glycol (MIRALAX / GLYCOLAX) packet Take 17 g by mouth 2 (two) times daily. 14 each 0  . prochlorperazine (COMPAZINE) 10 MG tablet Take 1 tablet (10 mg total) by mouth every 6 (six) hours as needed for nausea or vomiting. 30 tablet 1  . senna-docusate (SENOKOT-S) 8.6-50 MG tablet Take 1 tablet by mouth 2 (two) times daily. 60 tablet 0  No current facility-administered medications for this visit.      Allergies: No Known Allergies  Past Medical History, Surgical history, Social history, and Family History updated today without any changes.  Physical Exam:  Blood pressure (!) 150/87, pulse 84, temperature 97.6 F (36.4 C), temperature source Oral, resp. rate 18, height 6' (1.829 m), weight 211 lb 14.4 oz (96.1 kg), SpO2 100 %.   ECOG: 1   General appearance: Alert, awake without any distress. Head:  Atraumatic without abnormalities Oropharynx: Without any thrush or ulcers. Eyes: No scleral icterus. Lymph nodes: No lymphadenopathy noted in the cervical, supraclavicular, or axillary nodes Heart:regular rate and rhythm, without any murmurs or gallops.   Lung: Clear to auscultation without any rhonchi, wheezes or dullness to percussion. Abdomin: Soft, nontender without any shifting dullness or ascites. Musculoskeletal: No clubbing or cyanosis. Neurological: No motor or sensory deficits. Skin: No rashes or lesions. Psychiatric: Mood and affect appeared normal.    Lab Results: Lab Results  Component Value Date   WBC 8.1 02/10/2018   HGB 12.8 (L) 02/10/2018   HCT 37.6 (L) 02/10/2018   MCV 93.5 02/10/2018   PLT 280 02/10/2018     Chemistry      Component Value Date/Time   NA 140 01/15/2018 0822   NA 137 02/06/2017 0828   K 4.3 01/15/2018 0822   K 4.0 02/06/2017 0828   CL 109 01/15/2018 0822   CO2 22 01/15/2018 0822   CO2 24 02/06/2017 0828   BUN 12 01/15/2018 0822   BUN 14.9 02/06/2017 0828   CREATININE 0.92 01/15/2018 0822   CREATININE 0.9 02/06/2017 0828      Component Value Date/Time   CALCIUM 9.9 01/15/2018 0822   CALCIUM 9.1 02/06/2017 0828   ALKPHOS 36 (L) 01/15/2018 0822   ALKPHOS 31 (L) 02/06/2017 0828   AST 17 01/15/2018 0822   AST 32 02/06/2017 0828   ALT 11 01/15/2018 0822   ALT 43 02/06/2017 0828   BILITOT 0.5 01/15/2018 0822   BILITOT 0.42 02/06/2017 0828           Impression and Plan:  68 year old man with:  1.  Light chain multiple myeloma diagnosed in July 2018.  He had plasmacytoma with 10% involvement of the bone marrow.  He is status post the above mentioned therapy and currently under consideration to start maintenance Revlimid.  Risks and benefits of this approach was discussed today.  Complication associated with Revlimid at 10 mg for 21 days and then a week off was reviewed.  After discussion today is agreeable to proceed.  His  complications include nausea, fatigue, myelosuppression among others.  After discussion is agreeable to proceed at this time and he has follow-up with Dr. Norma Fredrickson at [facility] in the early part of February.   2.  Bone health.: He will start Zometa today and repeated every 3 months to complete 2 years.  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  He is agreeable to proceed at this time.  3.  Neuropathy: He remains on Neurontin 900 mg 3 times daily which I asked to continue for the time being.  4. Follow-up: Will be in February 2020 for repeat evaluation.  15 minutes was spent with the patient face-to-face today.  More than 50% of time was dedicated to discussing his disease status, treatment options and answer question regarding future plan of care.Zola Button, MD 1/8/20201:57 PM

## 2018-02-10 NOTE — Patient Instructions (Signed)

## 2018-02-10 NOTE — Telephone Encounter (Signed)
Printed calendar and avs. °

## 2018-02-17 ENCOUNTER — Telehealth: Payer: Self-pay | Admitting: *Deleted

## 2018-02-17 ENCOUNTER — Encounter: Payer: Self-pay | Admitting: *Deleted

## 2018-02-17 NOTE — Telephone Encounter (Signed)
Gwen from dr Mikeal Hawthorne bartlett DDS calling. [phone].States they need to do a peridontal probe on patient. They would like to know if patient needs prophylactic antibiotics? They would also like something written on letter head stating okay for procedure.

## 2018-02-17 NOTE — Telephone Encounter (Signed)
Ok to send

## 2018-02-24 ENCOUNTER — Telehealth: Payer: Self-pay

## 2018-02-24 NOTE — Telephone Encounter (Signed)
Contacted patient and left message to start ASA 81 mg when he restarts the Revlimid tomorrow. To call back with any questions or concerns.

## 2018-02-24 NOTE — Telephone Encounter (Signed)
-----   Message from Wyatt Portela, MD sent at 02/24/2018  2:32 PM EST ----- Yes. Start ASA ----- Message ----- From: Scot Dock, RN Sent: 02/24/2018   2:10 PM EST To: Wyatt Portela, MD  Patient wants to confirm that he is to restart ASA 81 mg when he starts the Revlimid tomorrow. Please advise. Thanks

## 2018-03-10 DIAGNOSIS — C9000 Multiple myeloma not having achieved remission: Secondary | ICD-10-CM | POA: Diagnosis not present

## 2018-03-10 DIAGNOSIS — Z23 Encounter for immunization: Secondary | ICD-10-CM | POA: Diagnosis not present

## 2018-03-10 DIAGNOSIS — G629 Polyneuropathy, unspecified: Secondary | ICD-10-CM | POA: Diagnosis not present

## 2018-03-10 DIAGNOSIS — Z9484 Stem cells transplant status: Secondary | ICD-10-CM | POA: Diagnosis not present

## 2018-03-10 DIAGNOSIS — Z79899 Other long term (current) drug therapy: Secondary | ICD-10-CM | POA: Diagnosis not present

## 2018-03-10 DIAGNOSIS — Z7983 Long term (current) use of bisphosphonates: Secondary | ICD-10-CM | POA: Diagnosis not present

## 2018-03-10 DIAGNOSIS — C9001 Multiple myeloma in remission: Secondary | ICD-10-CM | POA: Diagnosis not present

## 2018-03-16 DIAGNOSIS — Z9484 Stem cells transplant status: Secondary | ICD-10-CM | POA: Diagnosis not present

## 2018-03-16 DIAGNOSIS — M5417 Radiculopathy, lumbosacral region: Secondary | ICD-10-CM | POA: Diagnosis not present

## 2018-03-16 DIAGNOSIS — Z8579 Personal history of other malignant neoplasms of lymphoid, hematopoietic and related tissues: Secondary | ICD-10-CM | POA: Diagnosis not present

## 2018-03-16 DIAGNOSIS — D638 Anemia in other chronic diseases classified elsewhere: Secondary | ICD-10-CM | POA: Diagnosis not present

## 2018-03-16 DIAGNOSIS — N528 Other male erectile dysfunction: Secondary | ICD-10-CM | POA: Diagnosis not present

## 2018-03-16 DIAGNOSIS — G620 Drug-induced polyneuropathy: Secondary | ICD-10-CM | POA: Diagnosis not present

## 2018-03-16 DIAGNOSIS — E785 Hyperlipidemia, unspecified: Secondary | ICD-10-CM | POA: Diagnosis not present

## 2018-03-17 ENCOUNTER — Inpatient Hospital Stay: Payer: Medicare Other | Attending: Oncology | Admitting: Medical

## 2018-03-17 ENCOUNTER — Telehealth: Payer: Self-pay | Admitting: *Deleted

## 2018-03-17 VITALS — BP 130/79 | HR 88 | Temp 99.0°F | Resp 18 | Ht 72.0 in | Wt 215.5 lb

## 2018-03-17 DIAGNOSIS — C9000 Multiple myeloma not having achieved remission: Secondary | ICD-10-CM

## 2018-03-17 DIAGNOSIS — M25551 Pain in right hip: Secondary | ICD-10-CM | POA: Diagnosis not present

## 2018-03-17 DIAGNOSIS — G8929 Other chronic pain: Secondary | ICD-10-CM

## 2018-03-17 DIAGNOSIS — M533 Sacrococcygeal disorders, not elsewhere classified: Secondary | ICD-10-CM

## 2018-03-17 MED ORDER — PREDNISONE 5 MG PO TABS: ORAL_TABLET | ORAL | 0 refills | Status: DC

## 2018-03-17 NOTE — Telephone Encounter (Signed)
Patient calling to c/o severe muscle spasms in his lower back, unable to get out of bed this am. States he saw his PCP yesterday and was told to contact his oncologist if pain continues. Pain is worse today. Robaxin and hydrocodone not helping. States he saw Sandi Mealy in the past for this issue, on 04/08/17.

## 2018-03-17 NOTE — Patient Instructions (Signed)
      [facility]    , [place]    Phone:      March 17, 2018  Patient: Alexander Saunders  Date of Birth: 18-Jan-1951  Date of Visit: March 17, 2018    To Whom It May Concern:  [name] was seen and treated on March 17, 2018 and should remain out of school until 03/22/2018    .   If you have any questions or concerns, please don't hesitate to call.   Sincerely,       Treatment Team:  Attending Provider: Harle Stanford PA-C Physician Assistant: Harle Stanford., PA-C

## 2018-03-17 NOTE — Telephone Encounter (Signed)
Los to schedulers for appt with Sandi Mealy in [facility] @ 3:30 today. No labs

## 2018-03-19 NOTE — Progress Notes (Signed)
[facility]pt[facility] Progress Note   Alexander Saunders [id number] 14-Jun-1950 68 y.o.  [name] is managed by Dr. Alen Blew  Actively treated with chemotherapy/immunotherapy/hormonal therapy: yes  Current Therapy: Revlimid and Zometa  Assessment: Plan:    Multiple myeloma, remission status unspecified (HCC)  Chronic right SI joint pain - Plan: predniSONE (DELTASONE) 5 MG tablet   Multiple myeloma: Mr. Maniscalco continues to be followed by Dr. Alen Blew is currently receiving Revlimid and Zometa.  He is scheduled to see Dr. Alen Blew in follow-up on 03/22/2018.  Right SI joint pain: Mr. Every was given a 12-day prednisone taper.  Please see After Visit Summary for patient specific instructions.  Future Appointments  Date Time Provider [place]  04/02/2018  9:50 AM Narda Amber K, DO [facility] None  04/02/2018 11:30 AM [facility] LAB 1 [facility] None  04/02/2018 12:00 PM Wyatt Portela, MD [facility] None  04/02/2018  1:00 PM [facility] INFUSION [facility] None    No orders of the defined types were placed in this encounter.      Subjective:   Patient ID:  [name] is a 68 y.o. (DOB 28-May-1950) male.  Chief Complaint:  Chief Complaint  Patient presents with  . Back Pain    HPI Alexander Saunders   is a 68 year old male with a history of multiple myeloma who is followed by Dr. Alen Blew and is treated with Revlimid and Zometa.  He presents to the office today with a report of shooting pain in his right hip which began on Saturday.  He reports that he helps.  It also helps to move around.  He states that he has been setting longer recently.  He denies any dizziness, headaches, nausea, vomiting, constipation, diarrhea, or urinary changes.  He reports having some skin changes since beginning Revlimid.  He denies any trauma or changes in activity.  He had an x-ray completed with his primary care provider which by his report showed no abnormal  findings.  Medications: I have reviewed the patient's current medications.  Allergies: No Known Allergies  Past Medical History:  Diagnosis Date  . 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    Past Surgical History:  Procedure Laterality Date  . APPENDECTOMY    . KNEE ARTHROSCOPY     r/knee  . LAMINECTOMY N/A 09/04/2016   Procedure: THORACIC FOUR-SIX LAMINECTOMY FOR RESECTION OF TUMOR;  Surgeon: Ditty, Kevan Ny, MD;  Location: [facility];  Service: Neurosurgery;  Laterality: N/A;    Family History  Problem Relation Age of Onset  . Hypertension Mother   . Cancer Mother   . Hypertension Father     Social History   Socioeconomic History  . Marital status: Married    Spouse name: Not on file  . Number of children: Not on file  . Years of education: Not on file  . Highest education level: Not on file  Occupational History  . Not on file  Social Needs  . Financial resource strain: Not on file  . Food insecurity:    Worry: Not on file    Inability: Not on file  . Transportation needs:    Medical: Not on file    Non-medical: Not on file  Tobacco Use  . Smoking status: Never Smoker  . Smokeless tobacco: Never Used  Substance and Sexual Activity  . Alcohol use: Yes    Comment: occ  . Drug use: Not on file  . Sexual activity: Not on file  Lifestyle  . Physical activity:  Days per week: Not on file    Minutes per session: Not on file  . Stress: Not on file  Relationships  . Social connections:    Talks on phone: Not on file    Gets together: Not on file    Attends religious service: Not on file    Active member of club or organization: Not on file    Attends meetings of clubs or organizations: Not on file    Relationship status: Not on file  . Intimate partner violence:    Fear of current or ex partner: Not on file    Emotionally abused: Not on file    Physically abused: Not on file    Forced sexual activity: Not on file  Other Topics Concern  . Not on file  Social  History Narrative  . Not on file    Past Medical History, Surgical history, Social history, and Family history were reviewed and updated as appropriate.   Please see review of systems for further details on the patient's review from today.   Review of Systems:  Review of Systems  Constitutional: Negative for chills, diaphoresis and fever.  HENT: Negative for trouble swallowing and voice change.   Respiratory: Negative for cough, chest tightness, shortness of breath and wheezing.   Cardiovascular: Negative for chest pain and palpitations.  Gastrointestinal: Negative for abdominal pain, constipation, diarrhea, nausea and vomiting.  Musculoskeletal: Positive for arthralgias. Negative for back pain and myalgias.  Neurological: Negative for dizziness, light-headedness and headaches.    Objective:   Physical Exam:  BP 130/79 (BP Location: Right Arm, Patient Position: Sitting)   Pulse 88   Temp 99 F (37.2 C) (Oral)   Resp 18   Ht 6' (1.829 m)   Wt 215 lb 8 oz (97.8 kg)   SpO2 97%   BMI 29.23 kg/m  ECOG: 1  Physical Exam Constitutional:      General: He is not in acute distress.    Appearance: He is not diaphoretic.  HENT:     Head: Normocephalic and atraumatic.  Cardiovascular:     Rate and Rhythm: Normal rate and regular rhythm.     Heart sounds: Normal heart sounds. No murmur. No friction rub. No gallop.   Pulmonary:     Effort: Pulmonary effort is normal. No respiratory distress.     Breath sounds: Normal breath sounds. No wheezing or rales.  Abdominal:     General: Abdomen is flat. Bowel sounds are normal. There is no distension.     Palpations: Abdomen is soft.     Tenderness: There is no abdominal tenderness. There is no guarding.  Musculoskeletal:        General: Tenderness present.     Comments: Tenderness over the right SI joint.   Skin:    General: Skin is warm and dry.     Findings: No erythema or rash.  Neurological:     Mental Status: He is alert.      Gait: Gait normal.     Comments: Negative straight leg raises bilaterally.  Psychiatric:        Mood and Affect: Mood normal.        Behavior: Behavior normal.        Thought Content: Thought content normal.        Judgment: Judgment normal.     Lab Review:     Component Value Date/Time   NA 139 02/10/2018 1304   NA 137 02/06/2017 0828   K 3.9  02/10/2018 1304   K 4.0 02/06/2017 0828   CL 108 02/10/2018 1304   CO2 24 02/10/2018 1304   CO2 24 02/06/2017 0828   GLUCOSE 104 (H) 02/10/2018 1304   GLUCOSE 111 02/06/2017 0828   BUN 16 02/10/2018 1304   BUN 14.9 02/06/2017 0828   CREATININE 1.07 02/10/2018 1304   CREATININE 0.9 02/06/2017 0828   CALCIUM 9.2 02/10/2018 1304   CALCIUM 9.1 02/06/2017 0828   PROT 7.2 02/10/2018 1304   PROT 6.1 (L) 02/06/2017 0828   ALBUMIN 4.0 02/10/2018 1304   ALBUMIN 3.7 02/06/2017 0828   AST 16 02/10/2018 1304   AST 32 02/06/2017 0828   ALT 7 02/10/2018 1304   ALT 43 02/06/2017 0828   ALKPHOS 41 02/10/2018 1304   ALKPHOS 31 (L) 02/06/2017 0828   BILITOT 0.5 02/10/2018 1304   BILITOT 0.42 02/06/2017 0828   GFRNONAA >60 02/10/2018 1304   GFRAA >60 02/10/2018 1304       Component Value Date/Time   WBC 8.1 02/10/2018 1304   WBC 5.5 03/06/2017 0822   RBC 4.02 (L) 02/10/2018 1304   HGB 12.8 (L) 02/10/2018 1304   HGB 12.0 (L) 02/06/2017 0828   HCT 37.6 (L) 02/10/2018 1304   HCT 36.2 (L) 02/06/2017 0828   PLT 280 02/10/2018 1304   PLT 258 02/06/2017 0828   MCV 93.5 02/10/2018 1304   MCV 97.8 02/06/2017 0828   MCH 31.8 02/10/2018 1304   MCHC 34.0 02/10/2018 1304   RDW 13.2 02/10/2018 1304   RDW 15.1 (H) 02/06/2017 0828   LYMPHSABS 1.2 02/10/2018 1304   LYMPHSABS 0.9 02/06/2017 0828   MONOABS 0.9 02/10/2018 1304   MONOABS 0.6 02/06/2017 0828   EOSABS 0.7 (H) 02/10/2018 1304   EOSABS 0.2 02/06/2017 0828   BASOSABS 0.0 02/10/2018 1304   BASOSABS 0.0 02/06/2017 0828   -------------------------------  Imaging from last 24 hours (if  applicable):  Radiology interpretation: No results found.

## 2018-03-23 ENCOUNTER — Other Ambulatory Visit: Payer: Self-pay

## 2018-03-23 MED ORDER — GABAPENTIN 300 MG PO CAPS: 900.0000 mg | ORAL_CAPSULE | Freq: Three times a day (TID) | ORAL | 3 refills | Status: DC

## 2018-03-24 ENCOUNTER — Other Ambulatory Visit: Payer: Self-pay

## 2018-03-24 MED ORDER — LENALIDOMIDE 10 MG PO CAPS: 10.0000 mg | ORAL_CAPSULE | Freq: Every day | ORAL | 0 refills | Status: DC

## 2018-04-01 NOTE — Progress Notes (Signed)
[facility] Clinic Note - Initial Visit   Date: 04/02/18  Alexander Saunders MRN: [id number] DOB: 1950/03/20   Dear Dr. Vertell Limber:  Thank you for your kind referral of Alexander Saunders for consultation of bilateral feet numbness. Although his history is well known to you, please allow [place] to reiterate it for the purpose of our medical record. The patient was accompanied to the clinic by self.   History of Present Illness: Alexander Saunders is a 68 y.o. right-handed African American male with multiple myeloma manifesting with epidural mass s/p T4-6 laminectomy (2018) presenting for evaluation of bilateral feet numbness, worse on the right.  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   Patient was in his usual state of very good health until July 2018 when he developed subacute onset of left sided back pain and sudden onset of bilateral leg weakness.  He had emergent MRI of the thoracic and lumbar spine which showed a epidural mass at T4-6 causing compression of the thoracic cord.  He underwent T4-6 laminectomy and resection of the mass in August 2018, pathology consistent with plasma cell neoplasm.  Further evaluation to the diagnosis of multiple myeloma, for which she is being followed by Dr. Alen Blew and hematology at [facility].  He underwent radiation therapy to the thoracic spine and started chemotherapy with Velcade, dexamethasone, and Cytoxan in September 2018.  This was followed by stem cell transplant and Revlimid.  Immediately following his back surgery, he developed numbness/tingling of the feet, which is worse in the right foot.  Symptoms intensified during chemotherapy but remained restricted to the feet.  He takes gabapentin '600mg'$  TID which helps some, he developed side effects on '900mg'$  TID, so reduced this, but did not noticed worsening of pain.  He recently was having low back muscle spasms on the right.  He also takes robaxin '500mg'$  BID, which helped.   He is back  in college at [employer] studying exercise physiology.  He continues to stay very active and goes to the gym 5 days/week.  He is planning on climbing [place] in [place] in the fall 2020.  Out-side paper records, electronic medical record, and images have been reviewed where available and summarized as:  MRI thoracic and lumbar spine 09/02/2016:  1. Dorsal epidural mass at the T4-T6 level resulting in compression of the thoracic spinal cord. 2. Diffusely abnormal bone marrow signal and contrast enhancement, consistent with a marrow replacement process such as multiple myeloma versus diffuse osseous metastatic disease. 3. No pathologic compression fracture.  Lab Results  Component Value Date   TSH 0.940 09/02/2016   Lab Results  Component Value Date   ESRSEDRATE 20 (H) 09/02/2016    Past Medical History:  Diagnosis Date  . 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    Past Surgical History:  Procedure Laterality Date  . APPENDECTOMY    . KNEE ARTHROSCOPY     r/knee  . LAMINECTOMY N/A 09/04/2016   Procedure: THORACIC FOUR-SIX LAMINECTOMY FOR RESECTION OF TUMOR;  Surgeon: Ditty, Kevan Ny, MD;  Location: [facility];  Service: Neurosurgery;  Laterality: N/A;     Medications:  Outpatient Encounter Medications as of 04/02/2018  Medication Sig Note  . acyclovir (ZOVIRAX) 400 MG tablet Take 1 tablet (400 mg total) by mouth 2 (two) times daily.   [name] aspirin EC 81 MG tablet Take 81 mg by mouth daily.   [name] gabapentin (NEURONTIN) 300 MG capsule Take 3 capsules (900 mg total) by mouth 3 (three)  times daily. Take on in the morning, one in the afternoon and two at night.   . lenalidomide (REVLIMID) 10 MG capsule Take 1 capsule (10 mg total) by mouth daily.   . methocarbamol (ROBAXIN) 750 MG tablet    . polyethylene glycol (MIRALAX / GLYCOLAX) packet Take 17 g by mouth 2 (two) times daily.   [name] senna-docusate (SENOKOT-S) 8.6-50 MG tablet Take 1 tablet by mouth 2 (two) times daily.   . nortriptyline  (PAMELOR) 10 MG capsule Start nortriptyline '10mg'$  at bedtime for 2 week, then increase to 2 tablet at bedtime   . [DISCONTINUED] dexamethasone (DECADRON) 4 MG tablet Take 5 tablets ('20mg'$ ) by mouth once weekly with breakfast on days of chemotherapy.   . [DISCONTINUED] predniSONE (DELTASONE) 5 MG tablet 6 tab x 2 day, 5 tab x 2 day, 4 tab x 2 day, 3 tab x 2 day, 2 tab x 2 day, 1 tab x 2 day, stop   . [DISCONTINUED] prochlorperazine (COMPAZINE) 10 MG tablet Take 1 tablet (10 mg total) by mouth every 6 (six) hours as needed for nausea or vomiting. 04/08/2017: As needed   No facility-administered encounter medications on file as of 04/02/2018.     Allergies: No Known Allergies  Family History: Family History  Problem Relation Age of Onset  . Hypertension Mother   . Cancer Mother   . Hypertension Father     Social History: Social History   Tobacco Use  . Smoking status: Never Smoker  . Smokeless tobacco: Never Used  Substance Use Topics  . Alcohol use: Yes    Comment: occ  . Drug use: Not on file   Social History   Social History Narrative   He worked as a Paramedic level of education:  college   He is back in college at [employer] studying exercise physiology.     Review of Systems:  CONSTITUTIONAL: No fevers, chills, night sweats, or weight loss.   EYES: No visual changes or eye pain ENT: No hearing changes.  No history of nose bleeds.   RESPIRATORY: No cough, wheezing and shortness of breath.   CARDIOVASCULAR: Negative for chest pain, and palpitations.   GI: Negative for abdominal discomfort, blood in stools or black stools.  No recent change in bowel habits.   GU:  No history of incontinence.   MUSCLOSKELETAL: No history of joint pain or swelling.  No myalgias.   SKIN: Negative for lesions, rash, and itching.   HEMATOLOGY/ONCOLOGY: Negative for prolonged bleeding, bruising easily, and swollen nodes.  +history of cancer.   ENDOCRINE: Negative for cold or heat  intolerance, polydipsia or goiter.   PSYCH:  No depression or anxiety symptoms.   NEURO: As Above.   Vital Signs:  BP 110/70   Pulse 72   Ht 6' (1.829 m)   Wt 214 lb (97.1 kg)   SpO2 95%   BMI 29.02 kg/m    General Medical Exam:   General:  Well appearing, comfortable.   Eyes/ENT: see cranial nerve examination.   Neck:   No carotid bruits. Respiratory:  Clear to auscultation, good air entry bilaterally.   Cardiac:  Regular rate and rhythm, no murmur.   Extremities:  No deformities, edema, or skin discoloration.  Skin:  No rashes or lesions.  Neurological Exam: MENTAL STATUS including orientation to time, place, person, recent and remote memory, attention span and concentration, language, and fund of knowledge is normal.  Speech is not dysarthric.  CRANIAL NERVES: II:  No visual field defects.  Unremarkable fundi.   III-IV-VI: Pupils equal round and reactive to light.  Normal conjugate, extra-ocular eye movements in all directions of gaze.  No nystagmus.  No ptosis.   V:  Normal facial sensation.    VII:  Normal facial symmetry and movements.   VIII:  Normal hearing and vestibular function.   IX-X:  Normal palatal movement.   XI:  Normal shoulder shrug and head rotation.   XII:  Normal tongue strength and range of motion, no deviation or fasciculation.  MOTOR:  No atrophy, fasciculations or abnormal movements.  No pronator drift.   Right Upper Extremity:    Left Upper Extremity:    Deltoid  5/5   Deltoid  5/5   Biceps  5/5   Biceps  5/5   Triceps  5/5   Triceps  5/5   Wrist extensors  5/5   Wrist extensors  5/5   Wrist flexors  5/5   Wrist flexors  5/5   Finger extensors  5/5   Finger extensors  5/5   Finger flexors  5/5   Finger flexors  5/5   Dorsal interossei  5/5   Dorsal interossei  5/5   Abductor pollicis  5/5   Abductor pollicis  5/5   Tone (Ashworth scale)  0  Tone (Ashworth scale)  0   Right Lower Extremity:    Left Lower Extremity:    Hip flexors  5/5   Hip  flexors  5/5   Hip extensors  5/5   Hip extensors  5/5   Knee flexors  5/5   Knee flexors  5/5   Knee extensors  5/5   Knee extensors  5/5   Dorsiflexors  5/5   Dorsiflexors  5/5   Plantarflexors  5/5   Plantarflexors  5/5   Toe extensors  5/5   Toe extensors  5/5   Toe flexors  5/5   Toe flexors  5/5   Tone (Ashworth scale)  0  Tone (Ashworth scale)  0   MSRs:  Right                                                                 Left brachioradialis 2+  brachioradialis 2+  biceps 2+  biceps 2+  triceps 2+  triceps 2+  patellar 3+  patellar 3+  ankle jerk 2+  ankle jerk 2+  Hoffman no  Hoffman no  plantar response down  plantar response down   SENSORY: Reduced vibration at the right ankle and great toe, as compared to the left.  Temperature and pinprick is intact bilaterally in the feet.  There is mild sway with Romberg testing.   COORDINATION/GAIT: Normal finger-to- nose-finger.  Intact rapid alternating movements bilaterally.   Gait narrow based and stable.  There is mild unsteadiness with tandem gait, however he can perform this.  Stress gait is normal.    IMPRESSION: Right >> left feet pain and paresthesias due to residual spinal cord injury from epidural neoplasm resulting in cord compression and effects of chemotherapy (Velcade).  His history of multiple myeloma also can cause neuropathy itself.  I recommend nerve testing to better localize his symptoms, which he would like to think about.  Ultimately, management is supportive as  much of what he is experiencing cannot be cured.  To be complete, I will check a few labs for secondary causes of neuropathy.  Most of the time, these labs do tend to return normal which patient was notified of.  For pain, he is taking gabapentin 600 mg 3 times daily, which can be continued.  I will add nortriptyline '10mg'$  at bedtime for 2 week, then increase to 2 tablet at bedtime.  Risks and benefits were discussed.  If pain is better controlled on  nortriptyline, I will plan to taper gabapentin.  Return to clinic in 4 months.   Thank you for allowing me to participate in patient's care.  If I can answer any additional questions, I would be pleased to do so.    Sincerely,    Donika K. Posey Pronto, DO

## 2018-04-02 ENCOUNTER — Encounter: Payer: Self-pay | Admitting: *Deleted

## 2018-04-02 ENCOUNTER — Telehealth: Payer: Self-pay | Admitting: Oncology

## 2018-04-02 ENCOUNTER — Inpatient Hospital Stay: Payer: Medicare Other

## 2018-04-02 ENCOUNTER — Encounter: Payer: Self-pay | Admitting: Neurology

## 2018-04-02 ENCOUNTER — Other Ambulatory Visit: Payer: Self-pay | Admitting: Neurology

## 2018-04-02 ENCOUNTER — Inpatient Hospital Stay (HOSPITAL_BASED_OUTPATIENT_CLINIC_OR_DEPARTMENT_OTHER): Payer: Medicare Other | Admitting: Oncology

## 2018-04-02 ENCOUNTER — Encounter: Payer: Self-pay | Admitting: Gastroenterology

## 2018-04-02 ENCOUNTER — Ambulatory Visit (INDEPENDENT_AMBULATORY_CARE_PROVIDER_SITE_OTHER): Payer: Medicare Other | Admitting: Neurology

## 2018-04-02 VITALS — BP 110/70 | HR 72 | Ht 72.0 in | Wt 214.0 lb

## 2018-04-02 VITALS — BP 144/89 | HR 80 | Temp 97.9°F | Resp 18 | Ht 72.0 in | Wt 213.6 lb

## 2018-04-02 DIAGNOSIS — M25551 Pain in right hip: Secondary | ICD-10-CM | POA: Diagnosis not present

## 2018-04-02 DIAGNOSIS — M792 Neuralgia and neuritis, unspecified: Secondary | ICD-10-CM | POA: Diagnosis not present

## 2018-04-02 DIAGNOSIS — G620 Drug-induced polyneuropathy: Secondary | ICD-10-CM

## 2018-04-02 DIAGNOSIS — R292 Abnormal reflex: Secondary | ICD-10-CM | POA: Diagnosis not present

## 2018-04-02 DIAGNOSIS — C7949 Secondary malignant neoplasm of other parts of nervous system: Secondary | ICD-10-CM

## 2018-04-02 DIAGNOSIS — C9000 Multiple myeloma not having achieved remission: Secondary | ICD-10-CM

## 2018-04-02 DIAGNOSIS — T451X5A Adverse effect of antineoplastic and immunosuppressive drugs, initial encounter: Secondary | ICD-10-CM

## 2018-04-02 LAB — CMP (CANCER CENTER ONLY)
ALT: 16 U/L (ref 0–44)
AST: 18 U/L (ref 15–41)
Albumin: 3.9 g/dL (ref 3.5–5.0)
Alkaline Phosphatase: 37 U/L — ABNORMAL LOW (ref 38–126)
Anion gap: 10 (ref 5–15)
BUN: 13 mg/dL (ref 8–23)
CO2: 25 mmol/L (ref 22–32)
Calcium: 9.4 mg/dL (ref 8.9–10.3)
Chloride: 106 mmol/L (ref 98–111)
Creatinine: 0.88 mg/dL (ref 0.61–1.24)
GFR, Est AFR Am: >60 mL/min (ref >60)
GFR, Estimated: >60 mL/min (ref >60)
Glucose, Bld: 102 mg/dL — ABNORMAL HIGH (ref 70–99)
Potassium: 4.1 mmol/L (ref 3.5–5.1)
Sodium: 141 mmol/L (ref 135–145)
Total Bilirubin: 0.4 mg/dL (ref 0.3–1.2)
Total Protein: 6.9 g/dL (ref 6.5–8.1)

## 2018-04-02 LAB — CBC WITH DIFFERENTIAL (CANCER CENTER ONLY)
Abs Immature Granulocytes: 0.06 10*3/uL (ref 0.00–0.07)
Basophils Absolute: 0.1 10*3/uL (ref 0.0–0.1)
Basophils Relative: 2 %
Eosinophils Absolute: 0.3 10*3/uL (ref 0.0–0.5)
Eosinophils Relative: 4 %
HCT: 39.7 % (ref 39.0–52.0)
Hemoglobin: 12.9 g/dL — ABNORMAL LOW (ref 13.0–17.0)
Immature Granulocytes: 1 %
Lymphocytes Relative: 16 %
Lymphs Abs: 1 10*3/uL (ref 0.7–4.0)
MCH: 31.3 pg (ref 26.0–34.0)
MCHC: 32.5 g/dL (ref 30.0–36.0)
MCV: 96.4 fL (ref 80.0–100.0)
Monocytes Absolute: 0.6 10*3/uL (ref 0.1–1.0)
Monocytes Relative: 9 %
Neutro Abs: 4.3 10*3/uL (ref 1.7–7.7)
Neutrophils Relative %: 68 %
Platelet Count: 306 10*3/uL (ref 150–400)
RBC: 4.12 MIL/uL — ABNORMAL LOW (ref 4.22–5.81)
RDW: 14.1 % (ref 11.5–15.5)
WBC Count: 6.4 10*3/uL (ref 4.0–10.5)
nRBC: 0 % (ref 0.0–0.2)

## 2018-04-02 MED ORDER — NORTRIPTYLINE HCL 10 MG PO CAPS: ORAL_CAPSULE | ORAL | 5 refills | Status: DC

## 2018-04-02 NOTE — Patient Instructions (Addendum)
Start nortriptyline 10mg  at bedtime for 2 week, then increase to 2 tablet at bedtime  Continue gabapentin 600mg  three times daily  Check labs  Call my office if you would like to proceed with nerve testing of the legs  Return to clinic in 4 months  [place] (EMG/NCS) INSTRUCTIONS  How to Prepare The neurologist conducting the EMG will need to know if you have certain medical conditions. Tell the neurologist and other EMG lab personnel if you: . Have a pacemaker or any other electrical medical device . Take blood-thinning medications . Have hemophilia, a blood-clotting disorder that causes prolonged bleeding Bathing Take a shower or bath shortly before your exam in order to remove oils from your skin. Don't apply lotions or creams before the exam.  What to Expect You'll likely be asked to change into a hospital gown for the procedure and lie down on an examination table. The following explanations can help you understand what will happen during the exam.  . Electrodes. The neurologist or a technician places surface electrodes at various locations on your skin depending on where you're experiencing symptoms. Or the neurologist may insert needle electrodes at different sites depending on your symptoms.  . Sensations. The electrodes will at times transmit a tiny electrical current that you may feel as a twinge or spasm. The needle electrode may cause discomfort or pain that usually ends shortly after the needle is removed. If you are concerned about discomfort or pain, you may want to talk to the neurologist about taking a short break during the exam.  . Instructions. During the needle EMG, the neurologist will assess whether there is any spontaneous electrical activity when the muscle is at rest - activity that isn't present in healthy muscle tissue - and the degree of activity when you slightly contract the muscle.  He or she will give you instructions on  resting and contracting a muscle at appropriate times. Depending on what muscles and nerves the neurologist is examining, he or she may ask you to change positions during the exam.  After your EMG You may experience some temporary, minor bruising where the needle electrode was inserted into your muscle. This bruising should fade within several days. If it persists, contact your primary care doctor.

## 2018-04-02 NOTE — Progress Notes (Signed)
Hematology and Oncology Follow Up Visit  Alexander Saunders [id number] Sep 04, 1950 68 y.o. 04/02/2018 12:11 PM Alexander Saunders MDOsei-Bonsu, [name], MD   Principle Diagnosis: 68 year old man with light chain kappa multiple myeloma diagnosed after presenting with plasmacytoma and 10% plasma involvement in the bone marrow in 2018.   Prior Therapy:  He is status post surgical decompression of an epidural mass on 09/04/2016 and the pathology showed a plasma cell neoplasm.  He is S/P adjuvant radiation therapy to the thoracic spine to be completed on 10/14/2016.  Velcade 1.5 mg/m weekly with dexamethasone 20 mg and Cytoxan 600 mg po. Therapy started on 10/24/2016.   High-dose chemotherapy followed by stem cell transplant: Melphalan 200 mg/m2 06/17/17 followed by autologous stem cell rescue 06/18/17.  He achieved a complete response although does have minimal residual disease.      Current therapy:   Zometa 4 mg every 3 months.  Revlimid 10 mg daily for 21 days out of a 28-day started in February 2020.     Interim History:  Alexander Saunders returns today for repeat evaluation.  Since the last visit, he reports no major changes in his health.  He completed the first cycle of Revlimid maintenance without any new complaints.  He denies any nausea, fatigue or tiredness.  He denies any erythema or joint pain.  He did report some slight rash that has resolved.  Continues to have lower back discomfort that has been chronic in nature and not dramatically changed.  He continues to be on Neurontin which is managing his neuropathic pain.  Patient denied any alteration mental status, neuropathy, confusion or dizziness.  Denies any headaches or lethargy.  Denies any night sweats, weight loss or changes in appetite.  Denied orthopnea, dyspnea on exertion or chest discomfort.  Denies shortness of breath, difficulty breathing hemoptysis or cough.  Denies any abdominal distention, nausea, early satiety or  dyspepsia.  Denies any hematuria, frequency, dysuria or nocturia.  Denies any skin irritation, dryness or rash.  Denies any ecchymosis or petechiae.  Denies any lymphadenopathy or clotting.  Denies any heat or cold intolerance.  Denies any anxiety or depression.  Remaining review of system is negative.     Medications: I have reviewed the patient's current medications.  Current Outpatient Medications  Medication Sig Dispense Refill  . acyclovir (ZOVIRAX) 400 MG tablet Take 1 tablet (400 mg total) by mouth 2 (two) times daily. 60 tablet 3  . aspirin EC 81 MG tablet Take 81 mg by mouth daily.    [name] gabapentin (NEURONTIN) 300 MG capsule Take 3 capsules (900 mg total) by mouth 3 (three) times daily. Take on in the morning, one in the afternoon and two at night. 180 capsule 3  . lenalidomide (REVLIMID) 10 MG capsule Take 1 capsule (10 mg total) by mouth daily. 21 capsule 0  . methocarbamol (ROBAXIN) 750 MG tablet     . nortriptyline (PAMELOR) 10 MG capsule Start nortriptyline 51m at bedtime for 2 week, then increase to 2 tablet at bedtime 60 capsule 5  . polyethylene glycol (MIRALAX / GLYCOLAX) packet Take 17 g by mouth 2 (two) times daily. 14 each 0  . senna-docusate (SENOKOT-S) 8.6-50 MG tablet Take 1 tablet by mouth 2 (two) times daily. 60 tablet 0   No current facility-administered medications for this visit.      Allergies: No Known Allergies  Past Medical History, Surgical history, Social history, and Family History updated today without any changes.  Physical Exam:  Blood pressure ([name]  144/89, pulse 80, temperature 97.9 F (36.6 C), temperature source Oral, resp. rate 18, height 6' (1.829 m), weight 213 lb 9.6 oz (96.9 kg), SpO2 98 %.   ECOG: 1   General appearance: Comfortable appearing without any discomfort Head: Normocephalic without any trauma Oropharynx: Mucous membranes are moist and pink without any thrush or ulcers. Eyes: Pupils are equal and round reactive to  light. Lymph nodes: No cervical, supraclavicular, inguinal or axillary lymphadenopathy.   Heart:regular rate and rhythm.  S1 and S2 without leg edema. Lung: Clear without any rhonchi or wheezes.  No dullness to percussion. Abdomin: Soft, nontender, nondistended with good bowel sounds.  No hepatosplenomegaly. Musculoskeletal: No joint deformity or effusion.  Full range of motion noted. Neurological: No deficits noted on motor, sensory and deep tendon reflex exam. Skin: No petechial rash or dryness.  Appeared moist.      Lab Results: Lab Results  Component Value Date   WBC 6.4 04/02/2018   HGB 12.9 (L) 04/02/2018   HCT 39.7 04/02/2018   MCV 96.4 04/02/2018   PLT 306 04/02/2018     Chemistry      Component Value Date/Time   NA 139 02/10/2018 1304   NA 137 02/06/2017 0828   K 3.9 02/10/2018 1304   K 4.0 02/06/2017 0828   CL 108 02/10/2018 1304   CO2 24 02/10/2018 1304   CO2 24 02/06/2017 0828   BUN 16 02/10/2018 1304   BUN 14.9 02/06/2017 0828   CREATININE 1.07 02/10/2018 1304   CREATININE 0.9 02/06/2017 0828      Component Value Date/Time   CALCIUM 9.2 02/10/2018 1304   CALCIUM 9.1 02/06/2017 0828   ALKPHOS 41 02/10/2018 1304   ALKPHOS 31 (L) 02/06/2017 0828   AST 16 02/10/2018 1304   AST 32 02/06/2017 0828   ALT 7 02/10/2018 1304   ALT 43 02/06/2017 0828   BILITOT 0.5 02/10/2018 1304   BILITOT 0.42 02/06/2017 0828       Results for Alexander Saunders (MRN [id number]) as of 04/02/2018 12:09  Ref. Range 01/16/2017 08:15 02/13/2017 08:26 04/10/2017 08:25  M Protein SerPl Elph-Mcnc Latest Ref Range: Not Observed g/dL Not Observed Not Observed Not Observed    Results for Alexander Saunders (MRN [id number]) as of 04/02/2018 12:09  Ref. Range 04/10/2017 08:24  Kappa free light chain Latest Ref Range: 3.3 - 19.4 mg/L 17.9  Lamda free light chains Latest Ref Range: 5.7 - 26.3 mg/L 13.9  Kappa, lamda light chain ratio Latest Ref Range: 0.26 - 1.65  1.29     Impression and  Plan:  68 year old man with:  1.  Kappa light chain multiple myeloma diagnosed in July 2018.  He had plasmacytoma as well as bone disease involvement.  He is status post therapy outlined above and currently on maintenance Revlimid.  He completed the first cycle of therapy without any complaints.  Risks and benefits of continuing this treatment long-term was discussed.  Complications associated with this therapy were reiterated to include nausea, neutropenia, mucositis, thrombosis and secondary leukemias.  After discussion is agreeable to continue.   2.  Bone health.:  He remains on Zometa which she will receive every 3 months.  His next injection will be in April 2020.  3.  Neuropathy and chronic back pain: Likely related to his surgery and plasmacytoma.  Is currently on Neurontin which is manageable.  4. Follow-up: Will be in April 2020  25 minutes was spent with the patient face-to-face today.  More than 50%  of time was dedicated to reviewing his laboratory data, complications related to therapy, answering question regarding future plan of care.   Zola Button, MD 2/28/202012:11 PM

## 2018-04-02 NOTE — Telephone Encounter (Signed)
Gave avs and calendar ° °

## 2018-04-09 ENCOUNTER — Telehealth: Payer: Self-pay | Admitting: *Deleted

## 2018-04-09 ENCOUNTER — Other Ambulatory Visit: Payer: Self-pay

## 2018-04-09 LAB — VITAMIN B1, WHOLE BLOOD: Vitamin B1 (Thiamine), Blood: 119 nmol/L (ref 78–185)

## 2018-04-09 NOTE — Telephone Encounter (Signed)
-----   Message from Alda Berthold, DO sent at 04/09/2018  3:17 PM EST ----- Only his vitamin B1 resulted - can you follow-up on if B12, copper, etc was collected? thanks.

## 2018-04-13 ENCOUNTER — Telehealth: Payer: Self-pay | Admitting: *Deleted

## 2018-04-13 ENCOUNTER — Other Ambulatory Visit: Payer: Self-pay | Admitting: *Deleted

## 2018-04-13 DIAGNOSIS — C9000 Multiple myeloma not having achieved remission: Secondary | ICD-10-CM

## 2018-04-13 MED ORDER — ACYCLOVIR 400 MG PO TABS: 400.0000 mg | ORAL_TABLET | Freq: Two times a day (BID) | ORAL | 3 refills | Status: DC

## 2018-04-13 MED ORDER — GABAPENTIN 300 MG PO CAPS: 900.0000 mg | ORAL_CAPSULE | Freq: Three times a day (TID) | ORAL | 3 refills | Status: DC

## 2018-04-13 NOTE — Telephone Encounter (Signed)
Patient requesting a refill on neurontin, that was increased to 900 mg tid by Sandi Mealy and acyclovir 400 mg bid.

## 2018-04-13 NOTE — Telephone Encounter (Signed)
Returned patient's call, LM asking which of the 5 drug stores he would like for his neurontin and acyclovir in to. Robaxin was not ordered by dr Alen Blew, he will need to contact his PCP for that.

## 2018-04-13 NOTE — Telephone Encounter (Signed)
-----   Message from Alda Berthold, DO sent at 04/09/2018  3:17 PM EST ----- Only his vitamin B1 resulted - can you follow-up on if B12, copper, etc was collected? thanks.

## 2018-04-13 NOTE — Telephone Encounter (Signed)
See next note

## 2018-04-13 NOTE — Telephone Encounter (Signed)
Left message giving patient results.  

## 2018-04-13 NOTE — Telephone Encounter (Signed)
Ok to refill 

## 2018-04-13 NOTE — Telephone Encounter (Signed)
-----   Message from Alda Berthold, DO sent at 04/13/2018 10:13 AM EDT ----- Please request [facility] to add results to Epic and inform pt all labs are normal, thanks.

## 2018-04-14 ENCOUNTER — Other Ambulatory Visit: Payer: Self-pay | Admitting: *Deleted

## 2018-04-14 MED ORDER — LENALIDOMIDE 10 MG PO CAPS: 10.0000 mg | ORAL_CAPSULE | Freq: Every day | ORAL | 0 refills | Status: DC

## 2018-04-22 LAB — TSH: TSH: 0.75 mIU/L (ref 0.40–4.50)

## 2018-04-22 LAB — COPPER, SERUM: Copper: 102 ug/dL (ref 70–175)

## 2018-04-22 LAB — VITAMIN B12: Vitamin B-12: 442 pg/mL (ref 200–1100)

## 2018-04-26 ENCOUNTER — Other Ambulatory Visit: Payer: Self-pay

## 2018-04-26 MED ORDER — LENALIDOMIDE 10 MG PO CAPS: 10.0000 mg | ORAL_CAPSULE | Freq: Every day | ORAL | 0 refills | Status: DC

## 2018-04-27 ENCOUNTER — Telehealth: Payer: Self-pay | Admitting: *Deleted

## 2018-04-27 NOTE — Telephone Encounter (Signed)
-----   Message from Alda Berthold, DO sent at 04/27/2018 10:00 AM EDT ----- Please notify patient lab are within normal limits.  Thank you.

## 2018-04-27 NOTE — Telephone Encounter (Signed)
Patient notified that labs are normal.

## 2018-05-06 ENCOUNTER — Other Ambulatory Visit: Payer: Self-pay

## 2018-05-06 ENCOUNTER — Inpatient Hospital Stay: Payer: Medicare Other

## 2018-05-06 ENCOUNTER — Inpatient Hospital Stay: Payer: Medicare Other | Attending: Oncology | Admitting: Oncology

## 2018-05-06 ENCOUNTER — Telehealth: Payer: Self-pay | Admitting: Oncology

## 2018-05-06 VITALS — BP 140/86 | HR 75 | Temp 97.6°F | Resp 17 | Ht 72.0 in | Wt 214.1 lb

## 2018-05-06 DIAGNOSIS — C9000 Multiple myeloma not having achieved remission: Secondary | ICD-10-CM

## 2018-05-06 DIAGNOSIS — G629 Polyneuropathy, unspecified: Secondary | ICD-10-CM | POA: Diagnosis not present

## 2018-05-06 DIAGNOSIS — M549 Dorsalgia, unspecified: Secondary | ICD-10-CM | POA: Insufficient documentation

## 2018-05-06 DIAGNOSIS — G8929 Other chronic pain: Secondary | ICD-10-CM | POA: Diagnosis not present

## 2018-05-06 LAB — CMP (CANCER CENTER ONLY)
ALT: 12 U/L (ref 0–44)
AST: 15 U/L (ref 15–41)
Albumin: 3.8 g/dL (ref 3.5–5.0)
Alkaline Phosphatase: 35 U/L — ABNORMAL LOW (ref 38–126)
Anion gap: 9 (ref 5–15)
BUN: 17 mg/dL (ref 8–23)
CO2: 25 mmol/L (ref 22–32)
Calcium: 9.6 mg/dL (ref 8.9–10.3)
Chloride: 104 mmol/L (ref 98–111)
Creatinine: 1.01 mg/dL (ref 0.61–1.24)
GFR, Est AFR Am: >60 mL/min (ref >60)
GFR, Estimated: >60 mL/min (ref >60)
Glucose, Bld: 110 mg/dL — ABNORMAL HIGH (ref 70–99)
Potassium: 4 mmol/L (ref 3.5–5.1)
Sodium: 138 mmol/L (ref 135–145)
Total Bilirubin: 0.4 mg/dL (ref 0.3–1.2)
Total Protein: 7.6 g/dL (ref 6.5–8.1)

## 2018-05-06 LAB — CBC WITH DIFFERENTIAL (CANCER CENTER ONLY)
Abs Immature Granulocytes: 0.01 10*3/uL (ref 0.00–0.07)
Basophils Absolute: 0.1 10*3/uL (ref 0.0–0.1)
Basophils Relative: 2 %
Eosinophils Absolute: 0.4 10*3/uL (ref 0.0–0.5)
Eosinophils Relative: 8 %
HCT: 39 % (ref 39.0–52.0)
Hemoglobin: 12.9 g/dL — ABNORMAL LOW (ref 13.0–17.0)
Immature Granulocytes: 0 %
Lymphocytes Relative: 23 %
Lymphs Abs: 1.3 10*3/uL (ref 0.7–4.0)
MCH: 31.2 pg (ref 26.0–34.0)
MCHC: 33.1 g/dL (ref 30.0–36.0)
MCV: 94.4 fL (ref 80.0–100.0)
Monocytes Absolute: 0.7 10*3/uL (ref 0.1–1.0)
Monocytes Relative: 12 %
Neutro Abs: 3.2 10*3/uL (ref 1.7–7.7)
Neutrophils Relative %: 55 %
Platelet Count: 299 10*3/uL (ref 150–400)
RBC: 4.13 MIL/uL — ABNORMAL LOW (ref 4.22–5.81)
RDW: 14 % (ref 11.5–15.5)
WBC Count: 5.7 10*3/uL (ref 4.0–10.5)
nRBC: 0 % (ref 0.0–0.2)

## 2018-05-06 MED ORDER — ZOLEDRONIC ACID 4 MG/100ML IV SOLN
4.0000 mg | Freq: Once | INTRAVENOUS | Status: AC
  Administered 2018-05-06: 4 mg via INTRAVENOUS
  Filled 2018-05-06: qty 100

## 2018-05-06 MED ORDER — SODIUM CHLORIDE 0.9 % IV SOLN
Freq: Once | INTRAVENOUS | Status: AC
  Administered 2018-05-06: 13:00:00 via INTRAVENOUS
  Filled 2018-05-06: qty 250

## 2018-05-06 NOTE — Patient Instructions (Signed)

## 2018-05-06 NOTE — Progress Notes (Signed)
Hematology and Oncology Follow Up Visit  Alexander Saunders [id number] July 16, 1950 68 y.o. 05/06/2018 12:06 PM Alexander Saunders MDOsei-Bonsu, [name], MD   Principle Diagnosis: 68 year old man with multiple myeloma diagnosed in 2018.  He presented with light chain kappa plasmacytoma and 10% plasma involvement in the bone marrow in 2018.     Prior Therapy:  He is status post surgical decompression of an epidural mass on 09/04/2016 and the pathology showed a plasma cell neoplasm.  He is S/P adjuvant radiation therapy to the thoracic spine to be completed on 10/14/2016.  Velcade 1.5 mg/m weekly with dexamethasone 20 mg and Cytoxan 600 mg po. Therapy started on 10/24/2016.   High-dose chemotherapy followed by stem cell transplant: Melphalan 200 mg/m2 06/17/17 followed by autologous stem cell rescue 06/18/17.  He achieved a complete response although does have minimal residual disease.      Current therapy:   Zometa 4 mg every 3 months.  Revlimid 10 mg daily for 21 days out of a 28-day started in February 2020.     Interim History:  [name] is here for a repeat evaluation.  Since the last visit, he reports no major changes in his health.  He continues to tolerate Revlimid maintenance without any recent concerns.  He does report some mild constipation and skin rash that resolved spontaneously.  He denies any worsening neuropathy or bone pain.  Does report chronic back pain which is unchanged.  He remains ambulatory and attends to activities of daily living.  He denies any recent hospitalization or illnesses.   He denied headaches, blurry vision, syncope or seizures.  Denies any fevers, chills or sweats.  Denied chest pain, palpitation, orthopnea or leg edema.  Denied cough, wheezing or hemoptysis.  Denied nausea, vomiting or abdominal pain.  Denies any constipation or diarrhea.  Denies any frequency urgency or hesitancy.  Denies any arthralgias or myalgias.  Denies any skin rashes or  lesions.  Denies any bleeding or clotting tendency.  Denies any easy bruising.  Denies any hair or nail changes.  Denies any anxiety or depression.  Remaining review of system is negative.           Medications: I have reviewed the patient's current medications.  Current Outpatient Medications  Medication Sig Dispense Refill  . acyclovir (ZOVIRAX) 400 MG tablet Take 1 tablet (400 mg total) by mouth 2 (two) times daily. 60 tablet 3  . aspirin EC 81 MG tablet Take 81 mg by mouth daily.    [name] gabapentin (NEURONTIN) 300 MG capsule Take 3 capsules (900 mg total) by mouth 3 (three) times daily. Take on in the morning, one in the afternoon and two at night. 270 capsule 3  . lenalidomide (REVLIMID) 10 MG capsule Take 1 capsule (10 mg total) by mouth daily. For 21 days and off for 7 days 21 capsule 0  . methocarbamol (ROBAXIN) 750 MG tablet     . nortriptyline (PAMELOR) 10 MG capsule Start nortriptyline 75m at bedtime for 2 week, then increase to 2 tablet at bedtime 60 capsule 5  . polyethylene glycol (MIRALAX / GLYCOLAX) packet Take 17 g by mouth 2 (two) times daily. 14 each 0  . senna-docusate (SENOKOT-S) 8.6-50 MG tablet Take 1 tablet by mouth 2 (two) times daily. 60 tablet 0   No current facility-administered medications for this visit.      Allergies: No Known Allergies  Past Medical History, Surgical history, Social history, and Family History updated today without any changes.  Physical Exam:  Blood pressure 140/86, pulse 75, temperature 97.6 F (36.4 C), temperature source Oral, resp. rate 17, height 6' (1.829 m), weight 214 lb 1.6 oz (97.1 kg), SpO2 100 %.    ECOG: 1   General appearance: Alert, awake without any distress. Head: Atraumatic without abnormalities Oropharynx: Without any thrush or ulcers. Eyes: No scleral icterus. Lymph nodes: No lymphadenopathy noted in the cervical, supraclavicular, or axillary nodes Heart:regular rate and rhythm, without any murmurs or  gallops.   Lung: Clear to auscultation without any rhonchi, wheezes or dullness to percussion. Abdomin: Soft, nontender without any shifting dullness or ascites. Musculoskeletal: No clubbing or cyanosis. Neurological: No motor or sensory deficits. Skin: No rashes or lesions.      Lab Results: Lab Results  Component Value Date   WBC 6.4 04/02/2018   HGB 12.9 (L) 04/02/2018   HCT 39.7 04/02/2018   MCV 96.4 04/02/2018   PLT 306 04/02/2018     Chemistry      Component Value Date/Time   NA 141 04/02/2018 1138   NA 137 02/06/2017 0828   K 4.1 04/02/2018 1138   K 4.0 02/06/2017 0828   CL 106 04/02/2018 1138   CO2 25 04/02/2018 1138   CO2 24 02/06/2017 0828   BUN 13 04/02/2018 1138   BUN 14.9 02/06/2017 0828   CREATININE 0.88 04/02/2018 1138   CREATININE 0.9 02/06/2017 0828      Component Value Date/Time   CALCIUM 9.4 04/02/2018 1138   CALCIUM 9.1 02/06/2017 0828   ALKPHOS 37 (L) 04/02/2018 1138   ALKPHOS 31 (L) 02/06/2017 0828   AST 18 04/02/2018 1138   AST 32 02/06/2017 0828   ALT 16 04/02/2018 1138   ALT 43 02/06/2017 0828   BILITOT 0.4 04/02/2018 1138   BILITOT 0.42 02/06/2017 0828           Impression and Plan:  68-year-old man with:  1.  Multiple myeloma diagnosed in July 2018.  He presented with Kappa light chain plasmacytoma with bone marrow disease involvement.   He continues to be on Revlimid without any major issues or concerns at this time.  Risks and benefits of continuing Revlimid and potential complications were reviewed.  These complications include nausea, fatigue as well as skin rash.  Protein studies obtained in March 2018 continues to show complete response at this time.  He has no evidence to suggest recurrent disease.  After discussion today he is agreeable to continue with maintenance.  The side effects he is experiencing from Revlimid remain mild at this time.  His rash has resolved and he is using MiraLAX for constipation.  2.  Bone  health.:  No recent bone pain or pathological fractures.  Continues to tolerate Zometa without any concerns.  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  He is agreeable to continue and will receive Zometa today and in 3 months.  3.  Neuropathy and chronic back pain: He remains on gabapentin with reasonable response.  4. Follow-up: He has follow-up in next month at [facility] and he will return in June for a repeat evaluation and Zometa infusion.  25 minutes was spent with the patient face-to-face today.  More than 50% of time was spent on reviewing his laboratory data, disease status update and answer questions regarding future plan of care.   Firas Shadad, MD 4/2/202012:06 PM 

## 2018-05-06 NOTE — Telephone Encounter (Signed)
Scheduled per 4/2 sch message

## 2018-05-07 LAB — MULTIPLE MYELOMA PANEL, SERUM
Albumin SerPl Elph-Mcnc: 3.7 g/dL (ref 2.9–4.4)
Albumin/Glob SerPl: 1.2 (ref 0.7–1.7)
Alpha 1: 0.3 g/dL (ref 0.0–0.4)
Alpha2 Glob SerPl Elph-Mcnc: 1 g/dL (ref 0.4–1.0)
B-Globulin SerPl Elph-Mcnc: 0.9 g/dL (ref 0.7–1.3)
Gamma Glob SerPl Elph-Mcnc: 1 g/dL (ref 0.4–1.8)
Globulin, Total: 3.1 g/dL (ref 2.2–3.9)
IgA: 140 mg/dL (ref 61–437)
IgG (Immunoglobin G), Serum: 1151 mg/dL (ref 603–1613)
IgM (Immunoglobulin M), Srm: 40 mg/dL (ref 20–172)
Total Protein ELP: 6.8 g/dL (ref 6.0–8.5)

## 2018-05-07 LAB — KAPPA/LAMBDA LIGHT CHAINS
Kappa free light chain: 20.3 mg/L — ABNORMAL HIGH (ref 3.3–19.4)
Kappa, lambda light chain ratio: 1.16 (ref 0.26–1.65)
Lambda free light chains: 17.5 mg/L (ref 5.7–26.3)

## 2018-06-01 ENCOUNTER — Other Ambulatory Visit: Payer: Self-pay

## 2018-06-01 MED ORDER — LENALIDOMIDE 10 MG PO CAPS: 10.0000 mg | ORAL_CAPSULE | Freq: Every day | ORAL | 0 refills | Status: DC

## 2018-06-16 DIAGNOSIS — Z7983 Long term (current) use of bisphosphonates: Secondary | ICD-10-CM | POA: Diagnosis not present

## 2018-06-16 DIAGNOSIS — G629 Polyneuropathy, unspecified: Secondary | ICD-10-CM | POA: Diagnosis not present

## 2018-06-16 DIAGNOSIS — Z79899 Other long term (current) drug therapy: Secondary | ICD-10-CM | POA: Diagnosis not present

## 2018-06-16 DIAGNOSIS — Z23 Encounter for immunization: Secondary | ICD-10-CM | POA: Diagnosis not present

## 2018-06-16 DIAGNOSIS — Z9484 Stem cells transplant status: Secondary | ICD-10-CM | POA: Diagnosis not present

## 2018-06-16 DIAGNOSIS — C9001 Multiple myeloma in remission: Secondary | ICD-10-CM | POA: Diagnosis not present

## 2018-06-16 DIAGNOSIS — Z7982 Long term (current) use of aspirin: Secondary | ICD-10-CM | POA: Diagnosis not present

## 2018-06-29 ENCOUNTER — Other Ambulatory Visit: Payer: Self-pay

## 2018-06-29 MED ORDER — LENALIDOMIDE 10 MG PO CAPS: 10.0000 mg | ORAL_CAPSULE | Freq: Every day | ORAL | 0 refills | Status: DC

## 2018-07-05 HISTORY — PX: BONE MARROW BIOPSY: SHX199

## 2018-07-09 DIAGNOSIS — Z9484 Stem cells transplant status: Secondary | ICD-10-CM | POA: Diagnosis not present

## 2018-07-09 DIAGNOSIS — C9001 Multiple myeloma in remission: Secondary | ICD-10-CM | POA: Diagnosis not present

## 2018-07-13 ENCOUNTER — Telehealth: Payer: Self-pay

## 2018-07-13 NOTE — Telephone Encounter (Signed)
Called patient and made him aware that per Dr. Alen Blew, hold Revlimid until next appointment on 6/26. Patient verbalized understanding and will call the office with any further questions or concerns.

## 2018-07-13 NOTE — Telephone Encounter (Signed)
-----   Message from Wyatt Portela, MD sent at 07/13/2018  7:19 AM EDT ----- Please till him to hold Revlimid till his next appointment. Thanks.  ----- Message ----- From: Tami Lin, RN Sent: 07/12/2018   2:13 PM EDT To: Wyatt Portela, MD  Patient is on Revlimid and has recently developed "a skin rash with blisters that are very itchy" Patient noticed blisters last week and when he went for a bone biopsy at [facility] on Friday he was given a prescription for Keflex x 10 days. Patient stated he had rashes before that were mainly dry spots but the recent rash is the worst he has seen. Started this months Revlimid on 07/06/18. Next appointment is 07/30/18. Lanelle Bal

## 2018-07-27 ENCOUNTER — Other Ambulatory Visit: Payer: Self-pay | Admitting: Oncology

## 2018-07-30 ENCOUNTER — Other Ambulatory Visit: Payer: Self-pay

## 2018-07-30 ENCOUNTER — Inpatient Hospital Stay: Payer: Medicare Other

## 2018-07-30 ENCOUNTER — Telehealth: Payer: Self-pay | Admitting: Oncology

## 2018-07-30 ENCOUNTER — Inpatient Hospital Stay (HOSPITAL_BASED_OUTPATIENT_CLINIC_OR_DEPARTMENT_OTHER): Payer: Medicare Other | Admitting: Oncology

## 2018-07-30 ENCOUNTER — Inpatient Hospital Stay: Payer: Medicare Other | Attending: Oncology

## 2018-07-30 VITALS — BP 140/90 | HR 88 | Temp 99.1°F | Resp 18 | Wt 210.8 lb

## 2018-07-30 DIAGNOSIS — C9000 Multiple myeloma not having achieved remission: Secondary | ICD-10-CM

## 2018-07-30 DIAGNOSIS — G8929 Other chronic pain: Secondary | ICD-10-CM | POA: Diagnosis not present

## 2018-07-30 DIAGNOSIS — G629 Polyneuropathy, unspecified: Secondary | ICD-10-CM | POA: Diagnosis not present

## 2018-07-30 LAB — CBC WITH DIFFERENTIAL (CANCER CENTER ONLY)
Abs Immature Granulocytes: 0.02 10*3/uL (ref 0.00–0.07)
Basophils Absolute: 0 10*3/uL (ref 0.0–0.1)
Basophils Relative: 1 %
Eosinophils Absolute: 0.6 10*3/uL — ABNORMAL HIGH (ref 0.0–0.5)
Eosinophils Relative: 12 %
HCT: 40.1 % (ref 39.0–52.0)
Hemoglobin: 13.3 g/dL (ref 13.0–17.0)
Immature Granulocytes: 0 %
Lymphocytes Relative: 23 %
Lymphs Abs: 1.2 10*3/uL (ref 0.7–4.0)
MCH: 30.9 pg (ref 26.0–34.0)
MCHC: 33.2 g/dL (ref 30.0–36.0)
MCV: 93.3 fL (ref 80.0–100.0)
Monocytes Absolute: 0.4 10*3/uL (ref 0.1–1.0)
Monocytes Relative: 9 %
Neutro Abs: 2.7 10*3/uL (ref 1.7–7.7)
Neutrophils Relative %: 55 %
Platelet Count: 192 10*3/uL (ref 150–400)
RBC: 4.3 MIL/uL (ref 4.22–5.81)
RDW: 14.5 % (ref 11.5–15.5)
WBC Count: 4.9 10*3/uL (ref 4.0–10.5)
nRBC: 0 % (ref 0.0–0.2)

## 2018-07-30 LAB — CMP (CANCER CENTER ONLY)
ALT: 10 U/L (ref 0–44)
AST: 17 U/L (ref 15–41)
Albumin: 4 g/dL (ref 3.5–5.0)
Alkaline Phosphatase: 29 U/L — ABNORMAL LOW (ref 38–126)
Anion gap: 9 (ref 5–15)
BUN: 13 mg/dL (ref 8–23)
CO2: 23 mmol/L (ref 22–32)
Calcium: 9.3 mg/dL (ref 8.9–10.3)
Chloride: 109 mmol/L (ref 98–111)
Creatinine: 0.81 mg/dL (ref 0.61–1.24)
GFR, Est AFR Am: >60 mL/min (ref >60)
GFR, Estimated: >60 mL/min (ref >60)
Glucose, Bld: 102 mg/dL — ABNORMAL HIGH (ref 70–99)
Potassium: 4 mmol/L (ref 3.5–5.1)
Sodium: 141 mmol/L (ref 135–145)
Total Bilirubin: 0.3 mg/dL (ref 0.3–1.2)
Total Protein: 7.1 g/dL (ref 6.5–8.1)

## 2018-07-30 MED ORDER — ZOLEDRONIC ACID 4 MG/100ML IV SOLN
4.0000 mg | Freq: Once | INTRAVENOUS | Status: AC
  Administered 2018-07-30: 4 mg via INTRAVENOUS
  Filled 2018-07-30: qty 100

## 2018-07-30 MED ORDER — SODIUM CHLORIDE 0.9 % IV SOLN
Freq: Once | INTRAVENOUS | Status: AC
  Administered 2018-07-30: 11:00:00 via INTRAVENOUS
  Filled 2018-07-30: qty 250

## 2018-07-30 NOTE — Patient Instructions (Signed)

## 2018-07-30 NOTE — Progress Notes (Signed)
Hematology and Oncology Follow Up Visit  Alexander Saunders [id number] 16-May-1950 68 y.o. 07/30/2018 10:28 AM Alexander Saunders MDOsei-Bonsu, Alexander Beard, MD   Principle Diagnosis: 68 year old man with kappa light chain multiple myeloma diagnosed in 2018.  He was found to have bone disease as well as 10% involvement in the bone marrow.  Prior Therapy:  He is status post surgical decompression of an epidural mass on 09/04/2016 and the pathology showed a plasma cell neoplasm.  He is S/P adjuvant radiation therapy to the thoracic spine to be completed on 10/14/2016.  Velcade 1.5 mg/m weekly with dexamethasone 20 mg and Cytoxan 600 mg po. Therapy started on 10/24/2016.   High-dose chemotherapy followed by stem cell transplant: Melphalan 200 mg/m2 06/17/17 followed by autologous stem cell rescue 06/18/17.  He achieved a complete response although does have minimal residual disease.      Current therapy:   Zometa 4 mg every 3 months.  Revlimid 10 mg daily for 21 days out of a 28-day started in February 2020.     Interim History:  Alexander Saunders is here for a follow-up.  Since last visit, he reports few complications related to Revlimid.  He reported worsening constipation, neuropathy and skin rash.  Therapy was withheld temporarily with improvement in his symptoms.  He remains active and continues to attend activities of daily living.  Try to exercise more regularly at this time.  He is neuropathy still chronic and unchanged at this time.   He denied any alteration mental status, neuropathy, confusion or dizziness.  Denies any headaches or lethargy.  Denies any night sweats, weight loss or changes in appetite.  Denied orthopnea, dyspnea on exertion or chest discomfort.  Denies shortness of breath, difficulty breathing hemoptysis or cough.  Denies any abdominal distention, nausea, early satiety or dyspepsia.  Denies any hematuria, frequency, dysuria or nocturia.  Denies any skin irritation, dryness or  rash.  Denies any ecchymosis or petechiae.  Denies any lymphadenopathy or clotting.  Denies any heat or cold intolerance.  Denies any anxiety or depression.  Remaining review of system is negative.              Medications: I have reviewed the patient's current medications.  Current Outpatient Medications  Medication Sig Dispense Refill  . acyclovir (ZOVIRAX) 400 MG tablet Take 1 tablet (400 mg total) by mouth 2 (two) times daily. 60 tablet 3  . aspirin EC 81 MG tablet Take 81 mg by mouth daily.    [name] gabapentin (NEURONTIN) 300 MG capsule Take 3 capsules (900 mg total) by mouth 3 (three) times daily. Take on in the morning, one in the afternoon and two at night. 270 capsule 3  . methocarbamol (ROBAXIN) 750 MG tablet     . nortriptyline (PAMELOR) 10 MG capsule Start nortriptyline '10mg'$  at bedtime for 2 week, then increase to 2 tablet at bedtime 60 capsule 5  . polyethylene glycol (MIRALAX / GLYCOLAX) packet Take 17 g by mouth 2 (two) times daily. 14 each 0  . REVLIMID 10 MG capsule TAKE 1 CAPSULE DAILY FOR 21 DAYS AND OFF FOR 7 DAYS 21 capsule 0  . senna-docusate (SENOKOT-S) 8.6-50 MG tablet Take 1 tablet by mouth 2 (two) times daily. 60 tablet 0   No current facility-administered medications for this visit.      Allergies: No Known Allergies  Past Medical History, Surgical history, Social history, and Family History updated today without any changes.  Physical Exam:   Blood pressure 140/90, pulse 88, temperature  99.1 F (37.3 C), temperature source Oral, resp. rate 18, weight 210 lb 12.8 oz (95.6 kg), SpO2 98 %.    ECOG: 1    General appearance: Comfortable appearing without any discomfort Head: Normocephalic without any trauma Oropharynx: Mucous membranes are moist and pink without any thrush or ulcers. Eyes: Pupils are equal and round reactive to light. Lymph nodes: No cervical, supraclavicular, inguinal or axillary lymphadenopathy.   Heart:regular rate and rhythm.   S1 and S2 without leg edema. Lung: Clear without any rhonchi or wheezes.  No dullness to percussion. Abdomin: Soft, nontender, nondistended with good bowel sounds.  No hepatosplenomegaly. Musculoskeletal: No joint deformity or effusion.  Full range of motion noted. Neurological: No deficits noted on motor, sensory and deep tendon reflex exam. Skin: No petechial rash or dryness.  Appeared moist.       Lab Results: Lab Results  Component Value Date   WBC 5.7 05/06/2018   HGB 12.9 (L) 05/06/2018   HCT 39.0 05/06/2018   MCV 94.4 05/06/2018   PLT 299 05/06/2018     Chemistry      Component Value Date/Time   NA 138 05/06/2018 1156   NA 137 02/06/2017 0828   K 4.0 05/06/2018 1156   K 4.0 02/06/2017 0828   CL 104 05/06/2018 1156   CO2 25 05/06/2018 1156   CO2 24 02/06/2017 0828   BUN 17 05/06/2018 1156   BUN 14.9 02/06/2017 0828   CREATININE 1.01 05/06/2018 1156   CREATININE 0.9 02/06/2017 0828      Component Value Date/Time   CALCIUM 9.6 05/06/2018 1156   CALCIUM 9.1 02/06/2017 0828   ALKPHOS 35 (L) 05/06/2018 1156   ALKPHOS 31 (L) 02/06/2017 0828   AST 15 05/06/2018 1156   AST 32 02/06/2017 0828   ALT 12 05/06/2018 1156   ALT 43 02/06/2017 0828   BILITOT 0.4 05/06/2018 1156   BILITOT 0.42 02/06/2017 0828          Impression and Plan:  68 year old man with:  1.  Kappa light chain multiple myeloma diagnosed in July 2018.     He is currently on Revlimid maintenance since February 2020 without any major complications. Protein studies obtained on May 06, 2018 continues to show evidence of relapsed disease with complete normalization of his light chain ratio.  Staging work-up including a bone marrow biopsy was repeated in June 2020 under the care of Dr. Norma Saunders at [facility] and continues to show remission.   Risks and benefits of continuing this therapy to complete 2 years of Revlimid was discussed.  He is willing to restart it again at  10 mg daily but if he is experiencing more symptoms the plan is to dose reduce him to 5 mg.  2.  Bone health: He is currently on Zometa which he will receive every 3 months.  Last infusion was on May 06, 2018 and will be repeated today.  Long-term complication associated with Zometa including osteonecrosis of the jaw and infusion related reaction were reviewed.  3.  Neuropathy and chronic back pain: Manageable at this time with Neurontin.  4.  Work-related duties: He still remains at risk of opportunistic infection and not fully recovered from his cancer and cancer treatment to resume work-related duties.  5. Follow-up: We will be in 6 weeks for repeat evaluation.  25 minutes was spent with the patient face-to-face today.  More than 50% of time was dedicated to reviewing his laboratory data, disease status update, treatment  options and answering questions regarding future plan of care.   Zola Button, MD 6/26/202010:28 AM

## 2018-07-30 NOTE — Telephone Encounter (Signed)
Called and left msg

## 2018-08-02 ENCOUNTER — Encounter: Payer: Self-pay | Admitting: Neurology

## 2018-08-02 ENCOUNTER — Ambulatory Visit (INDEPENDENT_AMBULATORY_CARE_PROVIDER_SITE_OTHER): Payer: Medicare Other | Admitting: Neurology

## 2018-08-02 ENCOUNTER — Other Ambulatory Visit: Payer: Self-pay

## 2018-08-02 VITALS — BP 148/74 | HR 85 | Ht 72.0 in | Wt 209.4 lb

## 2018-08-02 DIAGNOSIS — M545 Low back pain: Secondary | ICD-10-CM | POA: Diagnosis not present

## 2018-08-02 DIAGNOSIS — G8929 Other chronic pain: Secondary | ICD-10-CM

## 2018-08-02 DIAGNOSIS — T451X5A Adverse effect of antineoplastic and immunosuppressive drugs, initial encounter: Secondary | ICD-10-CM

## 2018-08-02 DIAGNOSIS — G959 Disease of spinal cord, unspecified: Secondary | ICD-10-CM

## 2018-08-02 DIAGNOSIS — G620 Drug-induced polyneuropathy: Secondary | ICD-10-CM

## 2018-08-02 LAB — KAPPA/LAMBDA LIGHT CHAINS
Kappa free light chain: 18.2 mg/L (ref 3.3–19.4)
Kappa, lambda light chain ratio: 1.2 (ref 0.26–1.65)
Lambda free light chains: 15.2 mg/L (ref 5.7–26.3)

## 2018-08-02 MED ORDER — TIZANIDINE HCL 2 MG PO TABS: 2.0000 mg | ORAL_TABLET | Freq: Every evening | ORAL | 5 refills | Status: DC | PRN

## 2018-08-02 NOTE — Patient Instructions (Signed)
For your low back pain, start tizanidine 2mg  at bedtime  Return to clinic in 6 months

## 2018-08-02 NOTE — Progress Notes (Signed)
Follow-up Visit   Date: 08/02/18   Alexander Saunders MRN: [id number] DOB: 1950-11-01   Interim History: Alexander Saunders is a 68 y.o. right-handed male with multiple myeloma manifesting with epidural mass s/p T4-6 laminectomy (2018) returning to the clinic for follow-up of bilateral leg paresthesias and new complaints of low back pain.  The patient was accompanied to the clinic by self.  UPDATE 08/02/2018: At his last visit, he was taking gabapentin 600 mg 3 times daily and I started nortriptyline 20 mg at bedtime for his pain.  He did not find any relief with nortriptyline and discontinued it.  He feels that there is increased numbness in his thighs and also complains of achy low back pain.  He does not have sharp/shooting pain into the legs. He is scheduled to have a PET scan in August.    He restarted Revlimid in February 2020.  He is complaining of achy right shin pain.   Medications:  Current Outpatient Medications on File Prior to Visit  Medication Sig Dispense Refill  . aspirin EC 81 MG tablet Take 81 mg by mouth daily.    [name] gabapentin (NEURONTIN) 300 MG capsule Take 3 capsules (900 mg total) by mouth 3 (three) times daily. Take on in the morning, one in the afternoon and two at night. 270 capsule 3  . polyethylene glycol (MIRALAX / GLYCOLAX) packet Take 17 g by mouth 2 (two) times daily. 14 each 0  . REVLIMID 10 MG capsule TAKE 1 CAPSULE DAILY FOR 21 DAYS AND OFF FOR 7 DAYS 21 capsule 0   No current facility-administered medications on file prior to visit.     Allergies: No Known Allergies  Review of Systems:  CONSTITUTIONAL: No fevers, chills, night sweats, or weight loss.  EYES: No visual changes or eye pain ENT: No hearing changes.  No history of nose bleeds.   RESPIRATORY: No cough, wheezing and shortness of breath.   CARDIOVASCULAR: Negative for chest pain, and palpitations.   GI: Negative for abdominal discomfort, blood in stools or black stools.  No recent change in  bowel habits.   GU:  No history of incontinence.   MUSCLOSKELETAL: +history of joint pain or swelling.  No myalgias.   SKIN: Negative for lesions, rash, and itching.   ENDOCRINE: Negative for cold or heat intolerance, polydipsia or goiter.   PSYCH:  No depression or anxiety symptoms.   NEURO: As Above.   Vital Signs:  BP (!) 148/74   Pulse 85   Ht 6' (1.829 m)   Wt 209 lb 6 oz (95 kg)   SpO2 98%   BMI 28.40 kg/m    General Medical Exam:   General:  Well appearing, comfortable  Eyes/ENT: see cranial nerve examination.   Neck:  No carotid bruits. Respiratory:  Clear to auscultation, good air entry bilaterally.   Cardiac:  Regular rate and rhythm, no murmur.   Ext:  No edema   Neurological Exam: MENTAL STATUS including orientation to time, place, person, recent and remote memory, attention span and concentration, language, and fund of knowledge is normal.  Speech is not dysarthric.  CRANIAL NERVES: Face is symmetric.   MOTOR:  Motor strength is 5/5 in all extremities.  No atrophy, fasciculations or abnormal movements.  No pronator drift.  Tone is normal.    MSRs:  Reflexes are 2+/4 throughout, except 3+/4 bilateral patella jerks.  SENSORY: Reduced vibration at the ankle and great toe as compared to the left.  Temperature  is intact bilaterally.  COORDINATION/GAIT:  Normal finger-to- nose-finger.  Intact rapid alternating movements bilaterally.  Gait narrow based and stable.    IMPRESSION/PLAN: 1.  Residual myelopathy from epidural neoplasm causing bilateral leg paresthesias, stable. 2.  Chemotherapy induced neuropathy manifesting with bilateral feet paresthesias, stable. 3.  Chronic low back pain, worsening  PLAN/RECOMMENDATIONS:  1.  Continue gabapentin '600mg'$  TID 2.  Start tizanidine '2mg'$  at bedtime for low back pain 3.  We discussed imaging the lumbar region due to worsening paresthesias and low back pain.  He did not wish to pursue MRI and mentions that he has an  upcoming PET scan at [facility]   Return to clinic in 6 months.    Thank you for allowing me to participate in patient's care.  If I can answer any additional questions, I would be pleased to do so.    Sincerely,    Donika K. Posey Pronto, DO

## 2018-08-03 LAB — MULTIPLE MYELOMA PANEL, SERUM
Albumin SerPl Elph-Mcnc: 3.8 g/dL (ref 2.9–4.4)
Albumin/Glob SerPl: 1.4 (ref 0.7–1.7)
Alpha 1: 0.2 g/dL (ref 0.0–0.4)
Alpha2 Glob SerPl Elph-Mcnc: 0.7 g/dL (ref 0.4–1.0)
B-Globulin SerPl Elph-Mcnc: 0.8 g/dL (ref 0.7–1.3)
Gamma Glob SerPl Elph-Mcnc: 1.1 g/dL (ref 0.4–1.8)
Globulin, Total: 2.8 g/dL (ref 2.2–3.9)
IgA: 142 mg/dL (ref 61–437)
IgG (Immunoglobin G), Serum: 1278 mg/dL (ref 603–1613)
IgM (Immunoglobulin M), Srm: 52 mg/dL (ref 20–172)
Total Protein ELP: 6.6 g/dL (ref 6.0–8.5)

## 2018-08-16 ENCOUNTER — Other Ambulatory Visit: Payer: Self-pay

## 2018-08-16 MED ORDER — GABAPENTIN 300 MG PO CAPS: 900.0000 mg | ORAL_CAPSULE | Freq: Three times a day (TID) | ORAL | 3 refills | Status: DC

## 2018-08-30 ENCOUNTER — Other Ambulatory Visit: Payer: Self-pay | Admitting: *Deleted

## 2018-08-30 MED ORDER — REVLIMID 10 MG PO CAPS: ORAL_CAPSULE | ORAL | 0 refills | Status: DC

## 2018-09-10 ENCOUNTER — Inpatient Hospital Stay: Payer: Medicare Other | Attending: Oncology | Admitting: Oncology

## 2018-09-10 ENCOUNTER — Other Ambulatory Visit: Payer: Self-pay

## 2018-09-10 ENCOUNTER — Inpatient Hospital Stay: Payer: Medicare Other

## 2018-09-10 VITALS — BP 142/81 | HR 82 | Temp 100.0°F | Resp 20 | Ht 72.0 in | Wt 212.8 lb

## 2018-09-10 DIAGNOSIS — C9000 Multiple myeloma not having achieved remission: Secondary | ICD-10-CM

## 2018-09-10 DIAGNOSIS — G629 Polyneuropathy, unspecified: Secondary | ICD-10-CM | POA: Diagnosis not present

## 2018-09-10 DIAGNOSIS — G8929 Other chronic pain: Secondary | ICD-10-CM | POA: Diagnosis not present

## 2018-09-10 LAB — CBC WITH DIFFERENTIAL (CANCER CENTER ONLY)
Abs Immature Granulocytes: 0.04 10*3/uL (ref 0.00–0.07)
Basophils Absolute: 0 10*3/uL (ref 0.0–0.1)
Basophils Relative: 1 %
Eosinophils Absolute: 0.4 10*3/uL (ref 0.0–0.5)
Eosinophils Relative: 6 %
HCT: 40 % (ref 39.0–52.0)
Hemoglobin: 13 g/dL (ref 13.0–17.0)
Immature Granulocytes: 1 %
Lymphocytes Relative: 16 %
Lymphs Abs: 1 10*3/uL (ref 0.7–4.0)
MCH: 31.4 pg (ref 26.0–34.0)
MCHC: 32.5 g/dL (ref 30.0–36.0)
MCV: 96.6 fL (ref 80.0–100.0)
Monocytes Absolute: 0.6 10*3/uL (ref 0.1–1.0)
Monocytes Relative: 9 %
Neutro Abs: 4.5 10*3/uL (ref 1.7–7.7)
Neutrophils Relative %: 67 %
Platelet Count: 277 10*3/uL (ref 150–400)
RBC: 4.14 MIL/uL — ABNORMAL LOW (ref 4.22–5.81)
RDW: 14.6 % (ref 11.5–15.5)
WBC Count: 6.6 10*3/uL (ref 4.0–10.5)
nRBC: 0 % (ref 0.0–0.2)

## 2018-09-10 LAB — CMP (CANCER CENTER ONLY)
ALT: 9 U/L (ref 0–44)
AST: 12 U/L — ABNORMAL LOW (ref 15–41)
Albumin: 3.9 g/dL (ref 3.5–5.0)
Alkaline Phosphatase: 27 U/L — ABNORMAL LOW (ref 38–126)
Anion gap: 11 (ref 5–15)
BUN: 14 mg/dL (ref 8–23)
CO2: 23 mmol/L (ref 22–32)
Calcium: 9.6 mg/dL (ref 8.9–10.3)
Chloride: 105 mmol/L (ref 98–111)
Creatinine: 1.02 mg/dL (ref 0.61–1.24)
GFR, Est AFR Am: >60 mL/min (ref >60)
GFR, Estimated: >60 mL/min (ref >60)
Glucose, Bld: 100 mg/dL — ABNORMAL HIGH (ref 70–99)
Potassium: 3.6 mmol/L (ref 3.5–5.1)
Sodium: 139 mmol/L (ref 135–145)
Total Bilirubin: 0.5 mg/dL (ref 0.3–1.2)
Total Protein: 7.1 g/dL (ref 6.5–8.1)

## 2018-09-10 NOTE — Progress Notes (Signed)
Hematology and Oncology Follow Up Visit  Alexander Saunders [id number] 1951/01/07 68 y.o. 09/10/2018 8:52 AM [name], [name], MDOsei-Bonsu, [name], MD   Principle Diagnosis: 68 year old man with multiple myeloma presented with bone involvement, kappa light elevation and 10% involvement in the bone marrow in 2018.  Prior Therapy:  He is status post surgical decompression of an epidural mass on 09/04/2016 and the pathology showed a plasma cell neoplasm.  He is S/P adjuvant radiation therapy to the thoracic spine to be completed on 10/14/2016.  Velcade 1.5 mg/m weekly with dexamethasone 20 mg and Cytoxan 600 mg po. Therapy started on 10/24/2016.   High-dose chemotherapy followed by stem cell transplant: Melphalan 200 mg/m2 completed on 06/17/17 followed by autologous stem cell rescue 06/18/17.  He achieved complete response.      Current therapy:   Zometa 4 mg every 3 months.  Revlimid 10 mg daily for 21 days out of a 28-day started in February 2020.     Interim History:  [name] presents today for a repeat evaluation.  Since the last visit, he reports no major changes in his health.  He continues to be in good physical shape and continues to exercise regularly.  He is eating well although still limited by his neuropathy and certain aspects of his mobility.  He is currently on Neurontin which helps with his pain.  He is currently on Revlimid which she has tolerated well.  He does report some dry skin but no rash.  He denies any mouth sores or ulcers.   Patient denied headaches, blurry vision, syncope or seizures.  Denies any fevers, chills or sweats.  Denied chest pain, palpitation, orthopnea or leg edema.  Denied cough, wheezing or hemoptysis.  Denied nausea, vomiting or abdominal pain.  Denies any constipation or diarrhea.  Denies any frequency urgency or hesitancy.  Denies any arthralgias or myalgias.  Denies any skin rashes or lesions.  Denies any bleeding or clotting tendency.   Denies any easy bruising.  Denies any hair or nail changes.  Denies any anxiety or depression.  Remaining review of system is negative.               Medications: Updated today on review. Current Outpatient Medications  Medication Sig Dispense Refill  . aspirin EC 81 MG tablet Take 81 mg by mouth daily.    [name] gabapentin (NEURONTIN) 300 MG capsule Take 3 capsules (900 mg total) by mouth 3 (three) times daily. Take on in the morning, one in the afternoon and two at night. 270 capsule 3  . polyethylene glycol (MIRALAX / GLYCOLAX) packet Take 17 g by mouth 2 (two) times daily. 14 each 0  . REVLIMID 10 MG capsule TAKE 1 CAPSULE DAILY FOR 21 DAYS AND OFF FOR 7 DAYS 21 capsule 0  . tiZANidine (ZANAFLEX) 2 MG tablet Take 1 tablet (2 mg total) by mouth at bedtime as needed for muscle spasms. 30 tablet 5   No current facility-administered medications for this visit.      Allergies: No Known Allergies  Past Medical History, Surgical history, Social history, and Family History reviewed without any changes.  Physical Exam:   Blood pressure (!) 142/81, pulse 82, temperature 100 F (37.8 C), temperature source Oral, resp. rate 20, height 6' (1.829 m), weight 212 lb 12.8 oz (96.5 kg), SpO2 100 %.     ECOG: 1   General appearance: Alert, awake without any distress. Head: Atraumatic without abnormalities Oropharynx: Without any thrush or ulcers. Eyes: No scleral  icterus. Lymph nodes: No lymphadenopathy noted in the cervical, supraclavicular, or axillary nodes Heart:regular rate and rhythm, without any murmurs or gallops.   Lung: Clear to auscultation without any rhonchi, wheezes or dullness to percussion. Abdomin: Soft, nontender without any shifting dullness or ascites. Musculoskeletal: No clubbing or cyanosis. Neurological: No motor or sensory deficits. Skin: No rashes or lesions. Psychiatric: Mood and affect appeared normal.       Lab Results: Lab Results  Component  Value Date   WBC 4.9 07/30/2018   HGB 13.3 07/30/2018   HCT 40.1 07/30/2018   MCV 93.3 07/30/2018   PLT 192 07/30/2018     Chemistry      Component Value Date/Time   NA 141 07/30/2018 1020   NA 137 02/06/2017 0828   K 4.0 07/30/2018 1020   K 4.0 02/06/2017 0828   CL 109 07/30/2018 1020   CO2 23 07/30/2018 1020   CO2 24 02/06/2017 0828   BUN 13 07/30/2018 1020   BUN 14.9 02/06/2017 0828   CREATININE 0.81 07/30/2018 1020   CREATININE 0.9 02/06/2017 0828      Component Value Date/Time   CALCIUM 9.3 07/30/2018 1020   CALCIUM 9.1 02/06/2017 0828   ALKPHOS 29 (L) 07/30/2018 1020   ALKPHOS 31 (L) 02/06/2017 0828   AST 17 07/30/2018 1020   AST 32 02/06/2017 0828   ALT 10 07/30/2018 1020   ALT 43 02/06/2017 0828   BILITOT 0.3 07/30/2018 1020   BILITOT 0.42 02/06/2017 0828      Results for [name], [name] (MRN [id number]) as of 09/10/2018 08:54  Ref. Range 07/30/2018 10:19  Total Protein ELP Latest Ref Range: 6.0 - 8.5 g/dL 6.6  Albumin SerPl Elph-Mcnc Latest Ref Range: 2.9 - 4.4 g/dL 3.8  Albumin/Glob SerPl Latest Ref Range: 0.7 - 1.7  1.4  Alpha2 Glob SerPl Elph-Mcnc Latest Ref Range: 0.4 - 1.0 g/dL 0.7  Alpha 1 Latest Ref Range: 0.0 - 0.4 g/dL 0.2  Gamma Glob SerPl Elph-Mcnc Latest Ref Range: 0.4 - 1.8 g/dL 1.1  M Protein SerPl Elph-Mcnc Latest Ref Range: Not Observed g/dL Not Observed  IFE 1 Unknown Comment  Globulin, Total Latest Ref Range: 2.2 - 3.9 g/dL 2.8  B-Globulin SerPl Elph-Mcnc Latest Ref Range: 0.7 - 1.3 g/dL 0.8  IgG (Immunoglobin G), Serum Latest Ref Range: 603 - 1,613 mg/dL 1,278  IgM (Immunoglobulin M), Srm Latest Ref Range: 20 - 172 mg/dL 52  IgA Latest Ref Range: 61 - 437 mg/dL 142   Results for [name], [name] (MRN [id number]) as of 09/10/2018 08:54  Ref. Range 07/30/2018 10:20  Kappa free light chain Latest Ref Range: 3.3 - 19.4 mg/L 18.2  Lamda free light chains Latest Ref Range: 5.7 - 26.3 mg/L 15.2  Kappa, lamda light chain ratio Latest Ref Range:  0.26 - 1.65  1.20     Impression and Plan:  68 year old man with:  1.  Multiple myeloma diagnosed in July 2018.  He presented with Kappa light chain as well as bone involvement.   He has achieved complete response after induction therapy followed by high-dose chemotherapy and stem cell transplant.  He remains on Revlimid maintenance without any complications.  Protein study on 07/30/2018 showed complete response with normalization of his protein studies.  Bone marrow biopsy obtained in June 2020 showed plasma cells and 2%.  Risk of relapse was assessed as well as future salvage options were reviewed.  Risks and benefits of continuing Revlimid was also discussed.  After discussion today he  is agreeable to continue.  2.  Bone health: He is currently on Zometa every 3 months without any complications.  Risks and benefits of continuing this treatment was discussed today.  These include osteonecrosis of the jaw and hypocalcemia.  Next infusion will be the end of September.  3.  Neuropathy and chronic back pain: He is currently on Neurontin with pain is manageable.  4.  Immunization: He is up-to-date I will receive his last set of immunizations week.  5. Follow-up: In 6 weeks for repeat evaluation.  25 minutes was spent with the patient face-to-face today.  More than 50% of time was spent on updating his disease status, risk of relapse and progression, complications related to current therapy and outlining future plan of care.   Zola Button, MD 8/7/20208:52 AM

## 2018-09-12 LAB — KAPPA/LAMBDA LIGHT CHAINS
Kappa free light chain: 22.9 mg/L — ABNORMAL HIGH (ref 3.3–19.4)
Kappa, lambda light chain ratio: 1.3 (ref 0.26–1.65)
Lambda free light chains: 17.6 mg/L (ref 5.7–26.3)

## 2018-09-14 LAB — MULTIPLE MYELOMA PANEL, SERUM
Albumin SerPl Elph-Mcnc: 3.8 g/dL (ref 2.9–4.4)
Albumin/Glob SerPl: 1.4 (ref 0.7–1.7)
Alpha 1: 0.2 g/dL (ref 0.0–0.4)
Alpha2 Glob SerPl Elph-Mcnc: 0.7 g/dL (ref 0.4–1.0)
B-Globulin SerPl Elph-Mcnc: 0.9 g/dL (ref 0.7–1.3)
Gamma Glob SerPl Elph-Mcnc: 1.1 g/dL (ref 0.4–1.8)
Globulin, Total: 2.9 g/dL (ref 2.2–3.9)
IgA: 155 mg/dL (ref 61–437)
IgG (Immunoglobin G), Serum: 1213 mg/dL (ref 603–1613)
IgM (Immunoglobulin M), Srm: 46 mg/dL (ref 20–172)
Total Protein ELP: 6.7 g/dL (ref 6.0–8.5)

## 2018-09-15 DIAGNOSIS — Z23 Encounter for immunization: Secondary | ICD-10-CM | POA: Diagnosis not present

## 2018-09-15 DIAGNOSIS — C9001 Multiple myeloma in remission: Secondary | ICD-10-CM | POA: Diagnosis not present

## 2018-09-15 DIAGNOSIS — Z9484 Stem cells transplant status: Secondary | ICD-10-CM | POA: Diagnosis not present

## 2018-09-17 ENCOUNTER — Telehealth: Payer: Self-pay

## 2018-09-17 NOTE — Telephone Encounter (Signed)
Received call from patient requesting a letter to state that due to his high risk he should only enroll in distance learning classes at this time. Letter was drafted and signed by the MD and a phone message was left for the patient that he can come and pick the letter up.

## 2018-09-20 ENCOUNTER — Encounter: Payer: Self-pay | Admitting: Gastroenterology

## 2018-09-21 ENCOUNTER — Telehealth: Payer: Self-pay | Admitting: Oncology

## 2018-09-21 NOTE — Telephone Encounter (Signed)
Called and left msg. Mailed printout  °

## 2018-09-24 ENCOUNTER — Other Ambulatory Visit: Payer: Self-pay | Admitting: Oncology

## 2018-09-28 ENCOUNTER — Telehealth: Payer: Self-pay

## 2018-09-28 NOTE — Telephone Encounter (Signed)
Received call from patient stating that he has not received a call from the pharmacy to set up delivery of Revlimid. Encouraged patient to contact [facility] since this RN followed up with pharmacy this morning to confirm receipt. Patient stated that he had the contact information and will follow up.

## 2018-09-28 NOTE — Telephone Encounter (Signed)
Received call from [facility] requesting updated auth # for Revlimid prescription received. Provided auth # [id number] and medication refill will be processed.

## 2018-10-12 ENCOUNTER — Ambulatory Visit (AMBULATORY_SURGERY_CENTER): Payer: Self-pay | Admitting: *Deleted

## 2018-10-12 ENCOUNTER — Other Ambulatory Visit: Payer: Self-pay

## 2018-10-12 VITALS — Temp 97.1°F | Ht 72.0 in | Wt 206.0 lb

## 2018-10-12 DIAGNOSIS — Z1211 Encounter for screening for malignant neoplasm of colon: Secondary | ICD-10-CM

## 2018-10-12 MED ORDER — PEG 3350-KCL-NA BICARB-NACL 420 G PO SOLR: 4000.0000 mL | Freq: Once | ORAL | 0 refills | Status: AC

## 2018-10-12 NOTE — Progress Notes (Signed)
No egg or soy allergy known to patient  No issues with past sedation with any surgeries  or procedures, no intubation problems  No diet pills per patient No home 02 use per patient  No blood thinners per patient  Pt states  issues with constipation- he uses Miralax PRN only - this has been an  Issue since stem cell transplant  - longest with no BM is 3-4 days - not hard stools  No A fib or A flutter  EMMI video sent to pt's e mail

## 2018-10-22 ENCOUNTER — Telehealth: Payer: Self-pay | Admitting: Gastroenterology

## 2018-10-22 NOTE — Telephone Encounter (Signed)
Do you now or have you had a fever in the last 14 days?     ° °  ° °Do you have any respiratory symptoms of shortness of breath or cough now or in the last 14 days?    ° °  ° °Do you have any family members or close contacts with diagnosed or suspected Covid-19 in the past 14 days?     ° °  ° °Have you been tested for Covid-19 and found to be positive?    ° °  ° °Pt made aware to check in on the 4th floor and that care partner may wait in the car or come up to the lobby during the procedure but will need to provide their own mask. ° ° °

## 2018-10-24 ENCOUNTER — Other Ambulatory Visit: Payer: Self-pay | Admitting: Oncology

## 2018-10-25 ENCOUNTER — Other Ambulatory Visit: Payer: Self-pay

## 2018-10-25 ENCOUNTER — Encounter: Payer: Self-pay | Admitting: Gastroenterology

## 2018-10-25 ENCOUNTER — Ambulatory Visit (AMBULATORY_SURGERY_CENTER): Payer: Medicare Other | Admitting: Gastroenterology

## 2018-10-25 ENCOUNTER — Other Ambulatory Visit: Payer: Self-pay | Admitting: Gastroenterology

## 2018-10-25 VITALS — BP 132/69 | HR 50 | Temp 97.7°F | Resp 11 | Ht 72.0 in | Wt 206.0 lb

## 2018-10-25 DIAGNOSIS — D120 Benign neoplasm of cecum: Secondary | ICD-10-CM

## 2018-10-25 DIAGNOSIS — Z1211 Encounter for screening for malignant neoplasm of colon: Secondary | ICD-10-CM | POA: Diagnosis not present

## 2018-10-25 MED ORDER — REVLIMID 10 MG PO CAPS: ORAL_CAPSULE | ORAL | 0 refills | Status: DC

## 2018-10-25 MED ORDER — SODIUM CHLORIDE 0.9 % IV SOLN: 500.0000 mL | Freq: Once | INTRAVENOUS | Status: DC

## 2018-10-25 NOTE — Patient Instructions (Signed)
Handouts given for polyps and hemorrhoids.  No Aspirin, Aleve, Naproxen or Ibuprofen for 2 weeks.  You may use Tylenol.  You will need another colonoscopy in 6 months, we will let you know when.   YOU HAD AN ENDOSCOPIC PROCEDURE TODAY AT [facility]:   Refer to the procedure report that was given to you for any specific questions about what was found during the examination.  If the procedure report does not answer your questions, please call your gastroenterologist to clarify.  If you requested that your care partner not be given the details of your procedure findings, then the procedure report has been included in a sealed envelope for you to review at your convenience later.  YOU SHOULD EXPECT: Some feelings of bloating in the abdomen. Passage of more gas than usual.  Walking can help get rid of the air that was put into your GI tract during the procedure and reduce the bloating. If you had a lower endoscopy (such as a colonoscopy or flexible sigmoidoscopy) you may notice spotting of blood in your stool or on the toilet paper. If you underwent a bowel prep for your procedure, you may not have a normal bowel movement for a few days.  Please Note:  You might notice some irritation and congestion in your nose or some drainage.  This is from the oxygen used during your procedure.  There is no need for concern and it should clear up in a day or so.  SYMPTOMS TO REPORT IMMEDIATELY:   Following lower endoscopy (colonoscopy or flexible sigmoidoscopy):  Excessive amounts of blood in the stool  Significant tenderness or worsening of abdominal pains  Swelling of the abdomen that is new, acute  Fever of 100F or higher  For urgent or emergent issues, a gastroenterologist can be reached at any hour by calling [phone].   DIET:  We do recommend a small meal at first, but then you may proceed to your regular diet.  Drink plenty of fluids but you should avoid alcoholic beverages for  24 hours.  ACTIVITY:  You should plan to take it easy for the rest of today and you should NOT DRIVE or use heavy machinery until tomorrow (because of the sedation medicines used during the test).    FOLLOW UP: Our staff will call the number listed on your records 48-72 hours following your procedure to check on you and address any questions or concerns that you may have regarding the information given to you following your procedure. If we do not reach you, we will leave a message.  We will attempt to reach you two times.  During this call, we will ask if you have developed any symptoms of COVID 19. If you develop any symptoms (ie: fever, flu-like symptoms, shortness of breath, cough etc.) before then, please call [phone].  If you test positive for Covid 19 in the 2 weeks post procedure, please call and report this information to [place].    If any biopsies were taken you will be contacted by phone or by letter within the next 1-3 weeks.  Please call us at [phone] if you have not heard about the biopsies in 3 weeks.    SIGNATURES/CONFIDENTIALITY: You and/or your care partner have signed paperwork which will be entered into your electronic medical record.  These signatures attest to the fact that that the information above on your After Visit Summary has been reviewed and is understood.  Full responsibility of the confidentiality  of this discharge information lies with you and/or your care-partner.

## 2018-10-25 NOTE — Progress Notes (Signed)
Pt's states no medical or surgical changes since previsit or office visit.  Temp KA VS CW 

## 2018-10-25 NOTE — Progress Notes (Signed)
PT taken to PACU. Monitors in place. VSS. Report given to RN. 

## 2018-10-25 NOTE — Progress Notes (Signed)
Called to room to assist during endoscopic procedure.  Patient ID and intended procedure confirmed with present staff. Received instructions for my participation in the procedure from the performing physician.  

## 2018-10-25 NOTE — Op Note (Signed)
[facility] Patient Name: Lord Badley Procedure Date: 10/25/2018 11:49 AM MRN: [id number] Endoscopist: Ladene Artist , MD Age: 68 Referring MD:  Date of Birth: 1950/11/29 Gender: Male Account #: [id number] Procedure:                Colonoscopy Indications:              Screening for colorectal malignant neoplasm Medicines:                Monitored Anesthesia Care Procedure:                Pre-Anesthesia Assessment:                           - Prior to the procedure, a History and Physical                            was performed, and patient medications and                            allergies were reviewed. The patient's tolerance of                            previous anesthesia was also reviewed. The risks                            and benefits of the procedure and the sedation                            options and risks were discussed with the patient.                            All questions were answered, and informed consent                            was obtained. Prior Anticoagulants: The patient has                            taken no previous anticoagulant or antiplatelet                            agents. ASA Grade Assessment: II - A patient with                            mild systemic disease. After reviewing the risks                            and benefits, the patient was deemed in                            satisfactory condition to undergo the procedure.                           After obtaining informed consent, the colonoscope  was passed under direct vision. Throughout the                            procedure, the patient's blood pressure, pulse, and                            oxygen saturations were monitored continuously. The                            Colonoscope was introduced through the anus and                            advanced to the the cecum, identified by                            appendiceal orifice and  ileocecal valve. The                            ileocecal valve, appendiceal orifice, and rectum                            were photographed. The quality of the bowel                            preparation was adequate after extensive lavage and                            suctioning. The patient tolerated the procedure                            well. The colonoscopy was somewhat difficult due to                            a redundant colon and a tortuous colon. Scope In: 11:57:21 AM Scope Out: 12:20:58 PM Scope Withdrawal Time: 0 hours 19 minutes 42 seconds  Total Procedure Duration: 0 hours 23 minutes 37 seconds  Findings:                 The perianal and digital rectal examinations were                            normal.                           A 10 mm polyp was found in the cecum. The polyp was                            sessile. The polyp was removed with a piecemeal                            technique using a hot snare. Resection and                            retrieval were complete.  External and internal hemorrhoids were found during                            retroflexion. The hemorrhoids were small and Grade                            I (internal hemorrhoids that do not prolapse).                           The exam was otherwise without abnormality on                            direct and retroflexion views. Complications:            No immediate complications. Estimated blood loss:                            None. Estimated Blood Loss:     Estimated blood loss: none. Impression:               - One 10 mm polyp in the cecum, removed piecemeal                            using a hot snare. Resected and retrieved.                           - External and internal hemorrhoids.                           - The examination was otherwise normal on direct                            and retroflexion views. Recommendation:           - Repeat colonoscopy in 6  months for surveillance                            after piecemeal polypectomy.                           - Patient has a contact number available for                            emergencies. The signs and symptoms of potential                            delayed complications were discussed with the                            patient. Return to normal activities tomorrow.                            Written discharge instructions were provided to the                            patient.                           -  Resume previous diet.                           - Continue present medications.                           - Await pathology results.                           - No aspirin, ibuprofen, naproxen, or other                            non-steroidal anti-inflammatory drugs for 2 weeks                            after polyp removal. Ladene Artist, MD 10/25/2018 12:27:21 PM This report has been signed electronically.

## 2018-10-27 ENCOUNTER — Telehealth: Payer: Self-pay

## 2018-10-27 NOTE — Telephone Encounter (Signed)
  Follow up Call-  Call back number 10/25/2018  Post procedure Call Back phone  # [phone], [phone]  Permission to leave phone message Yes  Some recent data might be hidden     Left message

## 2018-10-27 NOTE — Telephone Encounter (Signed)
Attempted to reach pt. For post-procedure f/u call. No answer. Left message for him to please not hesitate to call us if he has any questions/concerns or if he develops SXS of COVID 19 in the near future.

## 2018-10-28 ENCOUNTER — Encounter: Payer: Self-pay | Admitting: Gastroenterology

## 2018-10-29 ENCOUNTER — Inpatient Hospital Stay: Payer: Medicare Other | Attending: Oncology | Admitting: Oncology

## 2018-10-29 ENCOUNTER — Inpatient Hospital Stay: Payer: Medicare Other

## 2018-10-29 ENCOUNTER — Other Ambulatory Visit: Payer: Self-pay

## 2018-10-29 VITALS — BP 145/83 | HR 68 | Temp 98.3°F | Resp 17 | Ht 72.0 in | Wt 212.8 lb

## 2018-10-29 DIAGNOSIS — C9000 Multiple myeloma not having achieved remission: Secondary | ICD-10-CM

## 2018-10-29 DIAGNOSIS — Z79899 Other long term (current) drug therapy: Secondary | ICD-10-CM | POA: Diagnosis not present

## 2018-10-29 DIAGNOSIS — Z23 Encounter for immunization: Secondary | ICD-10-CM | POA: Diagnosis not present

## 2018-10-29 DIAGNOSIS — Z9221 Personal history of antineoplastic chemotherapy: Secondary | ICD-10-CM | POA: Diagnosis not present

## 2018-10-29 DIAGNOSIS — Z7982 Long term (current) use of aspirin: Secondary | ICD-10-CM | POA: Diagnosis not present

## 2018-10-29 DIAGNOSIS — M549 Dorsalgia, unspecified: Secondary | ICD-10-CM | POA: Insufficient documentation

## 2018-10-29 DIAGNOSIS — Z923 Personal history of irradiation: Secondary | ICD-10-CM | POA: Insufficient documentation

## 2018-10-29 DIAGNOSIS — G629 Polyneuropathy, unspecified: Secondary | ICD-10-CM | POA: Insufficient documentation

## 2018-10-29 DIAGNOSIS — G8929 Other chronic pain: Secondary | ICD-10-CM | POA: Diagnosis not present

## 2018-10-29 DIAGNOSIS — Z9484 Stem cells transplant status: Secondary | ICD-10-CM | POA: Insufficient documentation

## 2018-10-29 LAB — CBC WITH DIFFERENTIAL (CANCER CENTER ONLY)
Abs Immature Granulocytes: 0.02 10*3/uL (ref 0.00–0.07)
Basophils Absolute: 0.1 10*3/uL (ref 0.0–0.1)
Basophils Relative: 1 %
Eosinophils Absolute: 0.4 10*3/uL (ref 0.0–0.5)
Eosinophils Relative: 7 %
HCT: 38.7 % — ABNORMAL LOW (ref 39.0–52.0)
Hemoglobin: 13.1 g/dL (ref 13.0–17.0)
Immature Granulocytes: 0 %
Lymphocytes Relative: 19 %
Lymphs Abs: 1 10*3/uL (ref 0.7–4.0)
MCH: 31.8 pg (ref 26.0–34.0)
MCHC: 33.9 g/dL (ref 30.0–36.0)
MCV: 93.9 fL (ref 80.0–100.0)
Monocytes Absolute: 0.7 10*3/uL (ref 0.1–1.0)
Monocytes Relative: 12 %
Neutro Abs: 3.2 10*3/uL (ref 1.7–7.7)
Neutrophils Relative %: 61 %
Platelet Count: 223 10*3/uL (ref 150–400)
RBC: 4.12 MIL/uL — ABNORMAL LOW (ref 4.22–5.81)
RDW: 14.2 % (ref 11.5–15.5)
WBC Count: 5.3 10*3/uL (ref 4.0–10.5)
nRBC: 0 % (ref 0.0–0.2)

## 2018-10-29 LAB — CMP (CANCER CENTER ONLY)
ALT: 9 U/L (ref 0–44)
AST: 13 U/L — ABNORMAL LOW (ref 15–41)
Albumin: 3.8 g/dL (ref 3.5–5.0)
Alkaline Phosphatase: 32 U/L — ABNORMAL LOW (ref 38–126)
Anion gap: 8 (ref 5–15)
BUN: 9 mg/dL (ref 8–23)
CO2: 24 mmol/L (ref 22–32)
Calcium: 9.1 mg/dL (ref 8.9–10.3)
Chloride: 109 mmol/L (ref 98–111)
Creatinine: 0.82 mg/dL (ref 0.61–1.24)
GFR, Est AFR Am: >60 mL/min (ref >60)
GFR, Estimated: >60 mL/min (ref >60)
Glucose, Bld: 99 mg/dL (ref 70–99)
Potassium: 3.7 mmol/L (ref 3.5–5.1)
Sodium: 141 mmol/L (ref 135–145)
Total Bilirubin: 0.6 mg/dL (ref 0.3–1.2)
Total Protein: 7 g/dL (ref 6.5–8.1)

## 2018-10-29 MED ORDER — ZOLEDRONIC ACID 4 MG/100ML IV SOLN
4.0000 mg | Freq: Once | INTRAVENOUS | Status: AC
  Administered 2018-10-29: 4 mg via INTRAVENOUS
  Filled 2018-10-29: qty 100

## 2018-10-29 MED ORDER — INFLUENZA VAC A&B SA ADJ QUAD 0.5 ML IM PRSY
PREFILLED_SYRINGE | INTRAMUSCULAR | Status: AC
  Filled 2018-10-29: qty 0.5

## 2018-10-29 MED ORDER — INFLUENZA VAC A&B SA ADJ QUAD 0.5 ML IM PRSY
0.5000 mL | PREFILLED_SYRINGE | Freq: Once | INTRAMUSCULAR | Status: AC
  Administered 2018-10-29: 0.5 mL via INTRAMUSCULAR

## 2018-10-29 NOTE — Progress Notes (Signed)
Hematology and Oncology Follow Up Visit  EPIC TRIBBETT [id number] 02/03/1951 68 y.o. 10/29/2018 1:12 PM [name], [name] MDOsei-Bonsu, [name], MD   Principle Diagnosis: 68 year old man with Kappa light chain multiple myeloma diagnosed in 2018.  He was found to have bone disease as well as bone marrow involvement at that time.   Prior Therapy:  He is status post surgical decompression of an epidural mass on 09/04/2016 and the pathology showed a plasma cell neoplasm.  He is S/P adjuvant radiation therapy to the thoracic spine to be completed on 10/14/2016.  Velcade 1.5 mg/m weekly with dexamethasone 20 mg and Cytoxan 600 mg po. Therapy started on 10/24/2016.   High-dose chemotherapy followed by stem cell transplant: Melphalan 200 mg/m2 completed on 06/17/17 followed by autologous stem cell rescue 06/18/17.  He achieved complete response.      Current therapy:   Zometa 4 mg every 3 months.  Revlimid 10 mg daily for 21 days out of a 28-day started in February 2020.     Interim History:  Mr. Kowalski is here for a follow-up visit.  Since the last visit, he reports no major changes in his health.  He continues to tolerate Revlimid without any new complications.  He does report dry skin but no rash or lesions.  His appetite and performance status remain excellent.  He has gained weight and has been performing all activities of daily living.  He does report some back discomfort and neuropathy which is chronic in nature.   He denied any alteration mental status, neuropathy, confusion or dizziness.  Denies any headaches or lethargy.  Denies any night sweats, weight loss or changes in appetite.  Denied orthopnea, dyspnea on exertion or chest discomfort.  Denies shortness of breath, difficulty breathing hemoptysis or cough.  Denies any abdominal distention, nausea, early satiety or dyspepsia.  Denies any hematuria, frequency, dysuria or nocturia.  Denies any skin irritation, dryness or rash.   Denies any ecchymosis or petechiae.  Denies any lymphadenopathy or clotting.  Denies any heat or cold intolerance.  Denies any anxiety or depression.  Remaining review of system is negative.    Medications: Without any changes on review. Current Outpatient Medications  Medication Sig Dispense Refill  . aspirin EC 81 MG tablet Take 81 mg by mouth daily.    [name] gabapentin (NEURONTIN) 300 MG capsule Take 3 capsules (900 mg total) by mouth 3 (three) times daily. Take on in the morning, one in the afternoon and two at night. 270 capsule 3  . Multiple Vitamin (MULTIVITAMIN) capsule Take 1 capsule by mouth daily.    [name] OVER THE COUNTER MEDICATION CBD tincture 1000 mg daily    . penicillin v potassium (VEETID) 250 MG tablet Take 250 mg by mouth 4 (four) times daily.    [name] REVLIMID 10 MG capsule [employer] Auth # [id number]    Date Obtained 10/25/18 21 capsule 0  . tiZANidine (ZANAFLEX) 2 MG tablet Take 1 tablet (2 mg total) by mouth at bedtime as needed for muscle spasms. 30 tablet 5  . VIAGRA 100 MG tablet      No current facility-administered medications for this visit.      Allergies: No Known Allergies  Past Medical History, Surgical history, Social history, and Family History updated on review.  Physical Exam:    Blood pressure (!) 145/83, pulse 68, temperature 98.3 F (36.8 C), temperature source Oral, resp. rate 17, height 6' (1.829 m), weight 212 lb 12.8 oz (96.5 kg), SpO2 100 %.  ECOG: 1    General appearance: Comfortable appearing without any discomfort Head: Normocephalic without any trauma Oropharynx: Mucous membranes are moist and pink without any thrush or ulcers. Eyes: Pupils are equal and round reactive to light. Lymph nodes: No cervical, supraclavicular, inguinal or axillary lymphadenopathy.   Heart:regular rate and rhythm.  S1 and S2 without leg edema. Lung: Clear without any rhonchi or wheezes.  No dullness to percussion. Abdomin: Soft, nontender, nondistended with  good bowel sounds.  No hepatosplenomegaly. Musculoskeletal: No joint deformity or effusion.  Full range of motion noted. Neurological: No deficits noted on motor, sensory and deep tendon reflex exam. Skin: No petechial rash or dryness.  Appeared moist.  l.       Lab Results: Lab Results  Component Value Date   WBC 6.6 09/10/2018   HGB 13.0 09/10/2018   HCT 40.0 09/10/2018   MCV 96.6 09/10/2018   PLT 277 09/10/2018     Chemistry      Component Value Date/Time   NA 139 09/10/2018 0845   NA 137 02/06/2017 0828   K 3.6 09/10/2018 0845   K 4.0 02/06/2017 0828   CL 105 09/10/2018 0845   CO2 23 09/10/2018 0845   CO2 24 02/06/2017 0828   BUN 14 09/10/2018 0845   BUN 14.9 02/06/2017 0828   CREATININE 1.02 09/10/2018 0845   CREATININE 0.9 02/06/2017 0828      Component Value Date/Time   CALCIUM 9.6 09/10/2018 0845   CALCIUM 9.1 02/06/2017 0828   ALKPHOS 27 (L) 09/10/2018 0845   ALKPHOS 31 (L) 02/06/2017 0828   AST 12 (L) 09/10/2018 0845   AST 32 02/06/2017 0828   ALT 9 09/10/2018 0845   ALT 43 02/06/2017 0828   BILITOT 0.5 09/10/2018 0845   BILITOT 0.42 02/06/2017 0828      Results for SHOOTER, TANGEN (MRN [id number]) as of 10/29/2018 13:13  Ref. Range 07/30/2018 10:19 09/10/2018 08:44  M Protein SerPl Elph-Mcnc Latest Ref Range: Not Observed g/dL Not Observed Not Observed  IFE 1 Unknown Comment Comment  Globulin, Total Latest Ref Range: 2.2 - 3.9 g/dL 2.8 2.9  B-Globulin SerPl Elph-Mcnc Latest Ref Range: 0.7 - 1.3 g/dL 0.8 0.9  IgG (Immunoglobin G), Serum Latest Ref Range: 603 - 1,613 mg/dL 1,278 1,213  IgM (Immunoglobulin M), Srm Latest Ref Range: 20 - 172 mg/dL 52 46  IgA Latest Ref Range: 61 - 437 mg/dL 142 155    Impression and Plan:  68 year old man with:  1.  Kappa light chain multiple myeloma diagnosed in July 2018 with bone disease involvement at that time.   He is status post therapy outlined above and continues to be in remission.  Protein studies on  09/10/2018 were reviewed and he has a complete response at this time.  The natural course of this disease as well as risk of relapse in addition to different salvage options were reviewed.  At this time I recommended continue maintenance Revlimid to complete 2 years.  He is agreeable to proceed at this time.  2.  Bone health: He has tolerated Zometa every 3 weeks without any complications.  He will receive it today and repeated in 3 months.  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  Is agreeable to continue.   3.  Neuropathy and chronic back pain: Related to his surgery and tumor.  4.  Immunization: He is up-to-date at this time.  5. Follow-up: 6 weeks for repeat evaluation.  25 minutes was  spent with the patient face-to-face today.  More than 50% of time was dedicated to discussing the natural course of his disease, treatment options, complications related therapy and future plan of care.   Zola Button, MD 9/25/20201:12 PM

## 2018-10-29 NOTE — Patient Instructions (Signed)
Zoledronic Acid injection (Hypercalcemia, Oncology) What is this medicine? ZOLEDRONIC ACID (ZOE le dron ik AS id) lowers the amount of calcium loss from bone. It is used to treat too much calcium in your blood from cancer. It is also used to prevent complications of cancer that has spread to the bone. This medicine may be used for other purposes; ask your health care provider or pharmacist if you have questions. COMMON BRAND NAME(S): Zometa What should I tell my health care provider before I take this medicine? They need to know if you have any of these conditions:  aspirin-sensitive asthma  cancer, especially if you are receiving medicines used to treat cancer  dental disease or wear dentures  infection  kidney disease  receiving corticosteroids like dexamethasone or prednisone  an unusual or allergic reaction to zoledronic acid, other medicines, foods, dyes, or preservatives  pregnant or trying to get pregnant  breast-feeding How should I use this medicine? This medicine is for infusion into a vein. It is given by a health care professional in a hospital or clinic setting. Talk to your pediatrician regarding the use of this medicine in children. Special care may be needed. Overdosage: If you think you have taken too much of this medicine contact a poison control center or emergency room at once. NOTE: This medicine is only for you. Do not share this medicine with others. What if I miss a dose? It is important not to miss your dose. Call your doctor or health care professional if you are unable to keep an appointment. What may interact with this medicine?  certain antibiotics given by injection  NSAIDs, medicines for pain and inflammation, like ibuprofen or naproxen  some diuretics like bumetanide, furosemide  teriparatide  thalidomide This list may not describe all possible interactions. Give your health care provider a list of all the medicines, herbs, non-prescription  drugs, or dietary supplements you use. Also tell them if you smoke, drink alcohol, or use illegal drugs. Some items may interact with your medicine. What should I watch for while using this medicine? Visit your doctor or health care professional for regular checkups. It may be some time before you see the benefit from this medicine. Do not stop taking your medicine unless your doctor tells you to. Your doctor may order blood tests or other tests to see how you are doing. Women should inform their doctor if they wish to become pregnant or think they might be pregnant. There is a potential for serious side effects to an unborn child. Talk to your health care professional or pharmacist for more information. You should make sure that you get enough calcium and vitamin D while you are taking this medicine. Discuss the foods you eat and the vitamins you take with your health care professional. Some people who take this medicine have severe bone, joint, and/or muscle pain. This medicine may also increase your risk for jaw problems or a broken thigh bone. Tell your doctor right away if you have severe pain in your jaw, bones, joints, or muscles. Tell your doctor if you have any pain that does not go away or that gets worse. Tell your dentist and dental surgeon that you are taking this medicine. You should not have major dental surgery while on this medicine. See your dentist to have a dental exam and fix any dental problems before starting this medicine. Take good care of your teeth while on this medicine. Make sure you see your dentist for regular follow-up   appointments. What side effects may I notice from receiving this medicine? Side effects that you should report to your doctor or health care professional as soon as possible:  allergic reactions like skin rash, itching or hives, swelling of the face, lips, or tongue  anxiety, confusion, or depression  breathing problems  changes in vision  eye  pain  feeling faint or lightheaded, falls  jaw pain, especially after dental work  mouth sores  muscle cramps, stiffness, or weakness  redness, blistering, peeling or loosening of the skin, including inside the mouth  trouble passing urine or change in the amount of urine Side effects that usually do not require medical attention (report to your doctor or health care professional if they continue or are bothersome):  bone, joint, or muscle pain  constipation  diarrhea  fever  hair loss  irritation at site where injected  loss of appetite  nausea, vomiting  stomach upset  trouble sleeping  trouble swallowing  weak or tired This list may not describe all possible side effects. Call your doctor for medical advice about side effects. You may report side effects to FDA at 1-800-FDA-1088. Where should I keep my medicine? This drug is given in a hospital or clinic and will not be stored at home. NOTE: This sheet is a summary. It may not cover all possible information. If you have questions about this medicine, talk to your doctor, pharmacist, or health care provider.  2020 Elsevier/Gold Standard (2013-06-18 14:19:39)  

## 2018-11-01 ENCOUNTER — Telehealth: Payer: Self-pay | Admitting: Oncology

## 2018-11-01 LAB — MULTIPLE MYELOMA PANEL, SERUM
Albumin SerPl Elph-Mcnc: 3.6 g/dL (ref 2.9–4.4)
Albumin/Glob SerPl: 1.3 (ref 0.7–1.7)
Alpha 1: 0.2 g/dL (ref 0.0–0.4)
Alpha2 Glob SerPl Elph-Mcnc: 0.8 g/dL (ref 0.4–1.0)
B-Globulin SerPl Elph-Mcnc: 0.9 g/dL (ref 0.7–1.3)
Gamma Glob SerPl Elph-Mcnc: 1.1 g/dL (ref 0.4–1.8)
Globulin, Total: 2.9 g/dL (ref 2.2–3.9)
IgA: 169 mg/dL (ref 61–437)
IgG (Immunoglobin G), Serum: 1231 mg/dL (ref 603–1613)
IgM (Immunoglobulin M), Srm: 46 mg/dL (ref 20–172)
Total Protein ELP: 6.5 g/dL (ref 6.0–8.5)

## 2018-11-01 LAB — KAPPA/LAMBDA LIGHT CHAINS
Kappa free light chain: 23.6 mg/L — ABNORMAL HIGH (ref 3.3–19.4)
Kappa, lambda light chain ratio: 1.19 (ref 0.26–1.65)
Lambda free light chains: 19.8 mg/L (ref 5.7–26.3)

## 2018-11-01 NOTE — Telephone Encounter (Signed)
Scheduled appt per 9/25 sch message .  Sent a message and a calendar will be mailed out.

## 2018-11-19 DIAGNOSIS — R5381 Other malaise: Secondary | ICD-10-CM | POA: Diagnosis not present

## 2018-11-19 DIAGNOSIS — R6883 Chills (without fever): Secondary | ICD-10-CM | POA: Diagnosis not present

## 2018-11-22 ENCOUNTER — Telehealth: Payer: Self-pay

## 2018-11-22 DIAGNOSIS — Z125 Encounter for screening for malignant neoplasm of prostate: Secondary | ICD-10-CM | POA: Diagnosis not present

## 2018-11-22 DIAGNOSIS — N528 Other male erectile dysfunction: Secondary | ICD-10-CM | POA: Diagnosis not present

## 2018-11-22 DIAGNOSIS — G620 Drug-induced polyneuropathy: Secondary | ICD-10-CM | POA: Diagnosis not present

## 2018-11-22 DIAGNOSIS — Z20828 Contact with and (suspected) exposure to other viral communicable diseases: Secondary | ICD-10-CM | POA: Diagnosis not present

## 2018-11-22 DIAGNOSIS — Z9484 Stem cells transplant status: Secondary | ICD-10-CM | POA: Diagnosis not present

## 2018-11-22 DIAGNOSIS — E785 Hyperlipidemia, unspecified: Secondary | ICD-10-CM | POA: Diagnosis not present

## 2018-11-22 DIAGNOSIS — D638 Anemia in other chronic diseases classified elsewhere: Secondary | ICD-10-CM | POA: Diagnosis not present

## 2018-11-22 DIAGNOSIS — Z8579 Personal history of other malignant neoplasms of lymphoid, hematopoietic and related tissues: Secondary | ICD-10-CM | POA: Diagnosis not present

## 2018-11-22 DIAGNOSIS — R6883 Chills (without fever): Secondary | ICD-10-CM | POA: Diagnosis not present

## 2018-11-22 NOTE — Telephone Encounter (Signed)
Informed patient of information listed in last note on 11/22/18. Patient verbalized understanding. Patient was given phone number for [facility] and stated he would call them after our call had ended.

## 2018-11-22 NOTE — Telephone Encounter (Signed)
Called patient to inform him of Dr. Hazeline Junker message below.  Patient did not answer and this nurse was unable to leave a voicemail.  Per earlier phone conversation with patient, patient had made an appointment to see his PCP and was planning on getting tested for covid today.  Scheduling message sent to reschedule patient's appointment in November.

## 2018-11-22 NOTE — Telephone Encounter (Signed)
-----   Message from Wyatt Portela, MD sent at 11/22/2018  9:08 AM EDT ----- Regarding: RE: Patient advice request Okay to reschedule his appointment in December 2020.  His fever and body aches is not likely related to Revlimid.  We advised him that he gets tested for Covid at any of the urgent clinics in town. ----- Message ----- From: Teodoro Spray, RN Sent: 11/22/2018   8:59 AM EDT To: Wyatt Portela, MD Subject: Patient advice request                         Patient states he had fever x2 days and body aches last week. Fever and symptoms have since subsided. Patient wants to know if symptoms could be related to Revlimid.  Patient also has appointment to see you on 12/15/18 but sees oncologist at [facility] on same day.  Patient wants to know if he can reschedule appointments to December 2020.

## 2018-11-23 ENCOUNTER — Other Ambulatory Visit: Payer: Self-pay

## 2018-11-23 ENCOUNTER — Telehealth: Payer: Self-pay | Admitting: Oncology

## 2018-11-23 MED ORDER — REVLIMID 10 MG PO CAPS: ORAL_CAPSULE | ORAL | 0 refills | Status: DC

## 2018-11-23 NOTE — Telephone Encounter (Signed)
R/s appt per 10/19 sch message - pt aware of new appt date and time

## 2018-12-15 ENCOUNTER — Other Ambulatory Visit: Payer: Medicare Other

## 2018-12-15 ENCOUNTER — Ambulatory Visit: Payer: Medicare Other | Admitting: Oncology

## 2018-12-15 DIAGNOSIS — Z9484 Stem cells transplant status: Secondary | ICD-10-CM | POA: Diagnosis not present

## 2018-12-15 DIAGNOSIS — G629 Polyneuropathy, unspecified: Secondary | ICD-10-CM | POA: Diagnosis not present

## 2018-12-15 DIAGNOSIS — Z792 Long term (current) use of antibiotics: Secondary | ICD-10-CM | POA: Diagnosis not present

## 2018-12-15 DIAGNOSIS — C9001 Multiple myeloma in remission: Secondary | ICD-10-CM | POA: Diagnosis not present

## 2018-12-15 DIAGNOSIS — C9000 Multiple myeloma not having achieved remission: Secondary | ICD-10-CM | POA: Diagnosis not present

## 2018-12-15 DIAGNOSIS — Z7983 Long term (current) use of bisphosphonates: Secondary | ICD-10-CM | POA: Diagnosis not present

## 2018-12-15 DIAGNOSIS — Z923 Personal history of irradiation: Secondary | ICD-10-CM | POA: Diagnosis not present

## 2018-12-17 ENCOUNTER — Other Ambulatory Visit: Payer: Self-pay | Admitting: Oncology

## 2018-12-19 ENCOUNTER — Other Ambulatory Visit: Payer: Self-pay | Admitting: Oncology

## 2018-12-20 ENCOUNTER — Other Ambulatory Visit: Payer: Self-pay

## 2018-12-21 DIAGNOSIS — N528 Other male erectile dysfunction: Secondary | ICD-10-CM | POA: Diagnosis not present

## 2018-12-21 DIAGNOSIS — Z8579 Personal history of other malignant neoplasms of lymphoid, hematopoietic and related tissues: Secondary | ICD-10-CM | POA: Diagnosis not present

## 2018-12-21 DIAGNOSIS — G620 Drug-induced polyneuropathy: Secondary | ICD-10-CM | POA: Diagnosis not present

## 2018-12-21 DIAGNOSIS — E785 Hyperlipidemia, unspecified: Secondary | ICD-10-CM | POA: Diagnosis not present

## 2018-12-21 DIAGNOSIS — D638 Anemia in other chronic diseases classified elsewhere: Secondary | ICD-10-CM | POA: Diagnosis not present

## 2018-12-21 DIAGNOSIS — Z0001 Encounter for general adult medical examination with abnormal findings: Secondary | ICD-10-CM | POA: Diagnosis not present

## 2018-12-21 DIAGNOSIS — Z9484 Stem cells transplant status: Secondary | ICD-10-CM | POA: Diagnosis not present

## 2019-01-14 ENCOUNTER — Other Ambulatory Visit: Payer: Self-pay

## 2019-01-14 ENCOUNTER — Inpatient Hospital Stay (HOSPITAL_BASED_OUTPATIENT_CLINIC_OR_DEPARTMENT_OTHER): Payer: Medicare Other | Admitting: Oncology

## 2019-01-14 ENCOUNTER — Inpatient Hospital Stay: Payer: Medicare Other | Attending: Oncology

## 2019-01-14 VITALS — BP 127/86 | HR 63 | Temp 97.8°F | Resp 20 | Ht 72.0 in | Wt 211.5 lb

## 2019-01-14 DIAGNOSIS — G629 Polyneuropathy, unspecified: Secondary | ICD-10-CM | POA: Diagnosis not present

## 2019-01-14 DIAGNOSIS — C9000 Multiple myeloma not having achieved remission: Secondary | ICD-10-CM | POA: Insufficient documentation

## 2019-01-14 NOTE — Progress Notes (Signed)
Hematology and Oncology Follow Up Visit  Alexander Saunders [id number] Apr 30, 1950 68 y.o. 01/14/2019 3:23 PM Alexander Saunders MDOsei-Bonsu, [name], MD   Principle Diagnosis: 68 year old man with multiple myeloma presented with bone disease and Kappa light chain in 2018. Prior Therapy:  He is status post surgical decompression of an epidural mass on 09/04/2016 and the pathology showed a plasma cell neoplasm.  He is S/P adjuvant radiation therapy to the thoracic spine to be completed on 10/14/2016.  Velcade 1.5 mg/m weekly with dexamethasone 20 mg and Cytoxan 600 mg po. Therapy started on 10/24/2016.   High-dose chemotherapy followed by stem cell transplant: Melphalan 200 mg/m2 completed on 06/17/17 followed by autologous stem cell rescue 06/18/17.  He achieved complete response.      Current therapy:   Zometa 4 mg every 3 months started in February 2020.  Revlimid 10 mg daily for 21 days out of a 28-day started in February 2020.     Interim History:  Alexander Saunders returns today for a follow-up.  Since the last visit, he continues to tolerate Revlimid without any major complications.  He denies any recent issues at this time except for mild dermatological toxicities including mild eruptions that are manageable.  Continues to be in reasonable health and shape without any recent hospitalization or illnesses.  He denies any recent thrombosis or bleeding.  He denies any worsening bone pain or pathological fractures.  He does report chronic neuropathy related to his malignancy and surgery.  He denied headaches, blurry vision, syncope or seizures.  Denies any fevers, chills or sweats.  Denied chest pain, palpitation, orthopnea or leg edema.  Denied cough, wheezing or hemoptysis.  Denied nausea, vomiting or abdominal pain.  Denies any constipation or diarrhea.  Denies any frequency urgency or hesitancy.  Denies any arthralgias or myalgias.  Denies any skin rashes or lesions.  Denies any bleeding  or clotting tendency.  Denies any easy bruising.  Denies any hair or nail changes.  Denies any anxiety or depression.  Remaining review of system is negative.      Medications: Unchanged on review. Current Outpatient Medications  Medication Sig Dispense Refill  . aspirin EC 81 MG tablet Take 81 mg by mouth daily.    [name] gabapentin (NEURONTIN) 300 MG capsule TAKE 3 CAPSULE BY MOUTH THREE TIMES DAILY. 3 CAPSULE IN THE MORNING 3 CAPSULE IN THE AFTERNOON AND 3 CAPSULE AT NIGHT 270 capsule 3  . Multiple Vitamin (MULTIVITAMIN) capsule Take 1 capsule by mouth daily.    [name] OVER THE COUNTER MEDICATION CBD tincture 1000 mg daily    . penicillin v potassium (VEETID) 250 MG tablet Take 250 mg by mouth 4 (four) times daily.    [name] REVLIMID 10 MG capsule TAKE 1 CAPSULE DAILY FOR 21 DAYS AND OFF FOR 7 DAYS 21 capsule 0  . tiZANidine (ZANAFLEX) 2 MG tablet Take 1 tablet (2 mg total) by mouth at bedtime as needed for muscle spasms. 30 tablet 5  . VIAGRA 100 MG tablet      No current facility-administered medications for this visit.     Allergies: No Known Allergies  Past Medical History, Surgical history, Social history, and Family History unchanged on review.  Physical Exam:    Blood pressure 127/86, pulse 63, temperature 97.8 F (36.6 C), temperature source Temporal, resp. rate 20, height 6' (1.829 m), weight 211 lb 8 oz (95.9 kg), SpO2 100 %.     ECOG: 1     General appearance: Alert, awake without any  distress. Head: Atraumatic without abnormalities Oropharynx: Without any thrush or ulcers. Eyes: No scleral icterus. Lymph nodes: No lymphadenopathy noted in the cervical, supraclavicular, or axillary nodes Heart:regular rate and rhythm, without any murmurs or gallops.   Lung: Clear to auscultation without any rhonchi, wheezes or dullness to percussion. Abdomin: Soft, nontender without any shifting dullness or ascites. Musculoskeletal: No clubbing or cyanosis. Neurological: No motor or  sensory deficits. Skin: No rashes or lesions.        Lab Results: Lab Results  Component Value Date   WBC 5.3 10/29/2018   HGB 13.1 10/29/2018   HCT 38.7 (L) 10/29/2018   MCV 93.9 10/29/2018   PLT 223 10/29/2018     Chemistry      Component Value Date/Time   NA 141 10/29/2018 1302   NA 137 02/06/2017 0828   K 3.7 10/29/2018 1302   K 4.0 02/06/2017 0828   CL 109 10/29/2018 1302   CO2 24 10/29/2018 1302   CO2 24 02/06/2017 0828   BUN 9 10/29/2018 1302   BUN 14.9 02/06/2017 0828   CREATININE 0.82 10/29/2018 1302   CREATININE 0.9 02/06/2017 0828      Component Value Date/Time   CALCIUM 9.1 10/29/2018 1302   CALCIUM 9.1 02/06/2017 0828   ALKPHOS 32 (L) 10/29/2018 1302   ALKPHOS 31 (L) 02/06/2017 0828   AST 13 (L) 10/29/2018 1302   AST 32 02/06/2017 0828   ALT 9 10/29/2018 1302   ALT 43 02/06/2017 0828   BILITOT 0.6 10/29/2018 1302   BILITOT 0.42 02/06/2017 0828      Results for Alexander Saunders (MRN [id number]) as of 01/14/2019 15:25  Ref. Range 09/10/2018 08:44 10/29/2018 13:02  M Protein SerPl Elph-Mcnc Latest Ref Range: Not Observed g/dL Not Observed Not Observed  IFE 1 Unknown Comment Comment  Globulin, Total Latest Ref Range: 2.2 - 3.9 g/dL 2.9 2.9  B-Globulin SerPl Elph-Mcnc Latest Ref Range: 0.7 - 1.3 g/dL 0.9 0.9  IgG (Immunoglobin G), Serum Latest Ref Range: 603 - 1,613 mg/dL 1,213 1,231  IgM (Immunoglobulin M), Srm Latest Ref Range: 20 - 172 mg/dL 46 46  IgA Latest Ref Range: 61 - 437 mg/dL 155 169   Results for Alexander Saunders (MRN [id number]) as of 01/14/2019 15:25  Ref. Range 09/10/2018 08:45 10/29/2018 13:02  Kappa free light chain Latest Ref Range: 3.3 - 19.4 mg/L 22.9 (H) 23.6 (H)  Lamda free light chains Latest Ref Range: 5.7 - 26.3 mg/L 17.6 19.8  Kappa, lamda light chain ratio Latest Ref Range: 0.26 - 1.65  1.30 1.19    Impression and Plan:  68 year old man with:  1.  Multiple myeloma diagnosed in 2018.  He was found to have Kappa light  chain with bone disease status oligosecretory subtype.   He remains on maintenance therapy on Revlimid and continues to show complete response at this time.  Protein studies obtained in September 2020 and repeated in November 2020 at [place] showed complete response.  Risks and benefits of continuing Revlimid to complete 2 years was reviewed and he is agreeable to continue.  2.  Bone health: He continues to tolerate Zometa without any complaints at this time.  Repeat every 3 months is indicated at this time.  Potential complications including osteonecrosis of the jaw was reviewed and will receive it in January 2021 based on his wishes.   3.  Neuropathy and chronic back pain: Currently on Neurontin which is managing his pain at this time.  4.  Immunization: No issues reported and up-to-date at this time.  5.  Covid considerations: He had questions regarding his risk of developing severe complications related to his previous malignancy and treatments.  We also discussed the role of vaccination once it is available at this time.  Highly encouraged him to receive the vaccine when it is available to him.  6. Follow-up: He will return in January 2020 for repeat laboratory testing and Zometa infusion and have MD follow-up at that time.  25 minutes was spent with the patient face-to-face today.  More than 50% of time was spent on reviewing his disease status, treatment management as well as answering questions regarding future plan of care.   Zola Button, MD 12/11/20203:23 PM

## 2019-01-17 ENCOUNTER — Telehealth: Payer: Self-pay | Admitting: Oncology

## 2019-01-17 LAB — KAPPA/LAMBDA LIGHT CHAINS
Kappa free light chain: 33.5 mg/L — ABNORMAL HIGH (ref 3.3–19.4)
Kappa, lambda light chain ratio: 1.12 (ref 0.26–1.65)
Lambda free light chains: 29.9 mg/L — ABNORMAL HIGH (ref 5.7–26.3)

## 2019-01-17 NOTE — Telephone Encounter (Signed)
Scheduled appt per 12/11 los.  Spoke with pt and they are aware of the appt date and time. 

## 2019-01-19 LAB — MULTIPLE MYELOMA PANEL, SERUM
Albumin SerPl Elph-Mcnc: 3.8 g/dL (ref 2.9–4.4)
Albumin/Glob SerPl: 1.3 (ref 0.7–1.7)
Alpha 1: 0.2 g/dL (ref 0.0–0.4)
Alpha2 Glob SerPl Elph-Mcnc: 0.7 g/dL (ref 0.4–1.0)
B-Globulin SerPl Elph-Mcnc: 0.9 g/dL (ref 0.7–1.3)
Gamma Glob SerPl Elph-Mcnc: 1.3 g/dL (ref 0.4–1.8)
Globulin, Total: 3.1 g/dL (ref 2.2–3.9)
IgA: 175 mg/dL (ref 61–437)
IgG (Immunoglobin G), Serum: 1404 mg/dL (ref 603–1613)
IgM (Immunoglobulin M), Srm: 55 mg/dL (ref 20–172)
Total Protein ELP: 6.9 g/dL (ref 6.0–8.5)

## 2019-01-21 ENCOUNTER — Other Ambulatory Visit: Payer: Self-pay

## 2019-01-21 MED ORDER — REVLIMID 10 MG PO CAPS: ORAL_CAPSULE | ORAL | 0 refills | Status: DC

## 2019-01-24 ENCOUNTER — Other Ambulatory Visit: Payer: Self-pay | Admitting: Neurology

## 2019-01-25 ENCOUNTER — Encounter: Payer: Self-pay | Admitting: Neurology

## 2019-02-07 ENCOUNTER — Ambulatory Visit (INDEPENDENT_AMBULATORY_CARE_PROVIDER_SITE_OTHER): Payer: Medicare Other | Admitting: Neurology

## 2019-02-07 ENCOUNTER — Encounter: Payer: Self-pay | Admitting: Neurology

## 2019-02-07 ENCOUNTER — Other Ambulatory Visit: Payer: Self-pay

## 2019-02-07 VITALS — BP 163/105 | HR 76 | Ht 72.0 in | Wt 214.5 lb

## 2019-02-07 DIAGNOSIS — T451X5A Adverse effect of antineoplastic and immunosuppressive drugs, initial encounter: Secondary | ICD-10-CM

## 2019-02-07 DIAGNOSIS — M545 Low back pain: Secondary | ICD-10-CM

## 2019-02-07 DIAGNOSIS — G620 Drug-induced polyneuropathy: Secondary | ICD-10-CM | POA: Diagnosis not present

## 2019-02-07 DIAGNOSIS — G8929 Other chronic pain: Secondary | ICD-10-CM | POA: Diagnosis not present

## 2019-02-07 DIAGNOSIS — G959 Disease of spinal cord, unspecified: Secondary | ICD-10-CM | POA: Diagnosis not present

## 2019-02-07 NOTE — Progress Notes (Signed)
Follow-up Visit   Date: 02/07/19   Alexander Saunders MRN: [id number] DOB: 12/18/1950   Interim History: Alexander Saunders is a 69 y.o. right-handed male with multiple myeloma manifesting with epidural mass s/p T4-6 laminectomy (2018) returning to the clinic for follow-up of bilateral leg paresthesias and low back pain.  The patient was accompanied to the clinic by self.  UPDATE 08/02/2018: At his last visit, he was taking gabapentin 600 mg 3 times daily and I started nortriptyline 20 mg at bedtime for his pain.  He did not find any relief with nortriptyline and discontinued it.  He feels that there is increased numbness in his thighs and also complains of achy low back pain.  He does not have sharp/shooting pain into the legs. He is scheduled to have a PET scan in August.    He restarted Revlimid in February 2020.  He is complaining of achy right shin pain.   UPDATE 02/06/2018:  He is here for follow-up visit.  Last clinic note was reviewed where he discontinued nortriptyline due to no relief and he reported new low back pain.  He was prescribed tizandine 59m - 432mfor back pain which helps.  He takes this only as needed 1-2 times per week. He continues to take gabapentin 60026mwice daily which he self-tapered down from 600m65mD.  He did not notice any marked change in pain, but feels that the numbness and tingling may be worse since making this change.  He prefers to stay on the lower dose. He has had two falls over the past 6 months, no injury.  His myeloma is under good control.   Medications:  Current Outpatient Medications on File Prior to Visit  Medication Sig Dispense Refill  . aspirin EC 81 MG tablet Take 81 mg by mouth daily.    . gaMarland Kitchenapentin (NEURONTIN) 300 MG capsule Take 1,800 mg by mouth daily. 3 caps in am 3 caps in pm    . Multiple Vitamin (MULTIVITAMIN) capsule Take 1 capsule by mouth daily.    . OVMarland KitchenR THE COUNTER MEDICATION CBD tincture 1000 mg daily    . REVLIMID 10 MG  capsule TAKE 1 CAPSULE DAILY FOR 21 DAYS AND OFF FOR 7 DAYS 21 capsule 0  . tiZANidine (ZANAFLEX) 2 MG tablet TAKE 1 TABLET(2 MG) BY MOUTH AT BEDTIME AS NEEDED FOR MUSCLE SPASMS 30 tablet 5  . VIAGRA 100 MG tablet      No current facility-administered medications on file prior to visit.    Allergies: No Known Allergies  Review of Systems:  CONSTITUTIONAL: No fevers, chills, night sweats, or weight loss.  EYES: No visual changes or eye pain ENT: No hearing changes.  No history of nose bleeds.   RESPIRATORY: No cough, wheezing and shortness of breath.   CARDIOVASCULAR: Negative for chest pain, and palpitations.   GI: Negative for abdominal discomfort, blood in stools or black stools.  No recent change in bowel habits.   GU:  No history of incontinence.   MUSCLOSKELETAL: +history of joint pain or swelling.  No myalgias.   SKIN: Negative for lesions, rash, and itching.   ENDOCRINE: Negative for cold or heat intolerance, polydipsia or goiter.   PSYCH:  No depression or anxiety symptoms.   NEURO: As Above.   Vital Signs:  BP (!) 163/105   Pulse 76   Ht 6' (1.829 m)   Wt 214 lb 8 oz (97.3 kg)   SpO2 97%   BMI 29.09 kg/m  General Medical Exam:   General:  Well appearing, comfortable  Eyes/ENT: see cranial nerve examination.   Neck:  No carotid bruits. Respiratory:  Clear to auscultation, good air entry bilaterally.   Cardiac:  Regular rate and rhythm, no murmur.   Ext:  No edema   Neurological Exam: MENTAL STATUS including orientation to time, place, person, recent and remote memory, attention span and concentration, language, and fund of knowledge is normal.  Speech is not dysarthric.  CRANIAL NERVES: Face is symmetric.   MOTOR:  Motor strength is 5/5 in all extremities.  No atrophy, fasciculations or abnormal movements.  No pronator drift.  Tone is normal.    MSRs:  Reflexes are 2+/4 throughout, except 3+/4 bilateral patella jerks.  SENSORY: Reduced vibration and  temperature over the right leg and foot as compared to the left.  COORDINATION/GAIT:  Normal finger-to- nose-finger.  Intact rapid alternating movements bilaterally.  Gait narrow based and stable.    IMPRESSION/PLAN: 1.  Residual myelopathy of epidural neoplasm causing bilateral leg paresthesias, stable 2.  Chemotherapy-induced neuropathy manifesting with bilateral feet paresthesias, stable. 3.  Chronic low back pain, stable.   PLAN/RECOMMENDATIONS:  1.  Continue gabapentin 659m twice daily 2.  Continue tizanidine 2-434mat bedtime as needed for low back pain 3.  Encouraged to start a low back and leg stretching exercise regimen   Return to clinic in 1 year  Thank you for allowing me to participate in patient's care.  If I can answer any additional questions, I would be pleased to do so.    Sincerely,    Berthe Oley K. PaPosey ProntoDO

## 2019-02-07 NOTE — Patient Instructions (Addendum)
1.  Continue gabapentin 600mg  twice daily 2.  Continue tizanidine 2-4mg  at bedtime as neded for low back pain  Return to clinic in 1 year

## 2019-02-11 ENCOUNTER — Inpatient Hospital Stay: Payer: Medicare Other

## 2019-02-11 ENCOUNTER — Inpatient Hospital Stay: Payer: Medicare Other | Attending: Oncology

## 2019-02-11 ENCOUNTER — Other Ambulatory Visit: Payer: Self-pay

## 2019-02-11 VITALS — BP 147/83 | HR 65 | Temp 97.8°F | Resp 18

## 2019-02-11 DIAGNOSIS — C9000 Multiple myeloma not having achieved remission: Secondary | ICD-10-CM

## 2019-02-11 LAB — CMP (CANCER CENTER ONLY)
ALT: 12 U/L (ref 0–44)
AST: 16 U/L (ref 15–41)
Albumin: 4 g/dL (ref 3.5–5.0)
Alkaline Phosphatase: 31 U/L — ABNORMAL LOW (ref 38–126)
Anion gap: 10 (ref 5–15)
BUN: 8 mg/dL (ref 8–23)
CO2: 24 mmol/L (ref 22–32)
Calcium: 8.9 mg/dL (ref 8.9–10.3)
Chloride: 107 mmol/L (ref 98–111)
Creatinine: 0.93 mg/dL (ref 0.61–1.24)
GFR, Est AFR Am: >60 mL/min (ref >60)
GFR, Estimated: >60 mL/min (ref >60)
Glucose, Bld: 104 mg/dL — ABNORMAL HIGH (ref 70–99)
Potassium: 3.6 mmol/L (ref 3.5–5.1)
Sodium: 141 mmol/L (ref 135–145)
Total Bilirubin: 0.5 mg/dL (ref 0.3–1.2)
Total Protein: 7.1 g/dL (ref 6.5–8.1)

## 2019-02-11 LAB — CBC WITH DIFFERENTIAL (CANCER CENTER ONLY)
Abs Immature Granulocytes: 0.06 10*3/uL (ref 0.00–0.07)
Basophils Absolute: 0 10*3/uL (ref 0.0–0.1)
Basophils Relative: 1 %
Eosinophils Absolute: 0.8 10*3/uL — ABNORMAL HIGH (ref 0.0–0.5)
Eosinophils Relative: 16 %
HCT: 37.6 % — ABNORMAL LOW (ref 39.0–52.0)
Hemoglobin: 12.7 g/dL — ABNORMAL LOW (ref 13.0–17.0)
Immature Granulocytes: 1 %
Lymphocytes Relative: 19 %
Lymphs Abs: 1 10*3/uL (ref 0.7–4.0)
MCH: 32.1 pg (ref 26.0–34.0)
MCHC: 33.8 g/dL (ref 30.0–36.0)
MCV: 94.9 fL (ref 80.0–100.0)
Monocytes Absolute: 0.6 10*3/uL (ref 0.1–1.0)
Monocytes Relative: 11 %
Neutro Abs: 2.7 10*3/uL (ref 1.7–7.7)
Neutrophils Relative %: 52 %
Platelet Count: 223 10*3/uL (ref 150–400)
RBC: 3.96 MIL/uL — ABNORMAL LOW (ref 4.22–5.81)
RDW: 15.2 % (ref 11.5–15.5)
WBC Count: 5.1 10*3/uL (ref 4.0–10.5)
nRBC: 0 % (ref 0.0–0.2)

## 2019-02-11 MED ORDER — SODIUM CHLORIDE 0.9 % IV SOLN
INTRAVENOUS | Status: DC
  Filled 2019-02-11: qty 250

## 2019-02-11 MED ORDER — ZOLEDRONIC ACID 4 MG/100ML IV SOLN
4.0000 mg | Freq: Once | INTRAVENOUS | Status: AC
  Administered 2019-02-11: 4 mg via INTRAVENOUS

## 2019-02-11 MED ORDER — ZOLEDRONIC ACID 4 MG/100ML IV SOLN
INTRAVENOUS | Status: AC
  Filled 2019-02-11: qty 100

## 2019-02-11 NOTE — Patient Instructions (Signed)
Zoledronic Acid injection (Hypercalcemia, Oncology) What is this medicine? ZOLEDRONIC ACID (ZOE le dron ik AS id) lowers the amount of calcium loss from bone. It is used to treat too much calcium in your blood from cancer. It is also used to prevent complications of cancer that has spread to the bone. This medicine may be used for other purposes; ask your health care provider or pharmacist if you have questions. COMMON BRAND NAME(S): Zometa What should I tell my health care provider before I take this medicine? They need to know if you have any of these conditions:  aspirin-sensitive asthma  cancer, especially if you are receiving medicines used to treat cancer  dental disease or wear dentures  infection  kidney disease  receiving corticosteroids like dexamethasone or prednisone  an unusual or allergic reaction to zoledronic acid, other medicines, foods, dyes, or preservatives  pregnant or trying to get pregnant  breast-feeding How should I use this medicine? This medicine is for infusion into a vein. It is given by a health care professional in a hospital or clinic setting. Talk to your pediatrician regarding the use of this medicine in children. Special care may be needed. Overdosage: If you think you have taken too much of this medicine contact a poison control center or emergency room at once. NOTE: This medicine is only for you. Do not share this medicine with others. What if I miss a dose? It is important not to miss your dose. Call your doctor or health care professional if you are unable to keep an appointment. What may interact with this medicine?  certain antibiotics given by injection  NSAIDs, medicines for pain and inflammation, like ibuprofen or naproxen  some diuretics like bumetanide, furosemide  teriparatide  thalidomide This list may not describe all possible interactions. Give your health care provider a list of all the medicines, herbs, non-prescription  drugs, or dietary supplements you use. Also tell them if you smoke, drink alcohol, or use illegal drugs. Some items may interact with your medicine. What should I watch for while using this medicine? Visit your doctor or health care professional for regular checkups. It may be some time before you see the benefit from this medicine. Do not stop taking your medicine unless your doctor tells you to. Your doctor may order blood tests or other tests to see how you are doing. Women should inform their doctor if they wish to become pregnant or think they might be pregnant. There is a potential for serious side effects to an unborn child. Talk to your health care professional or pharmacist for more information. You should make sure that you get enough calcium and vitamin D while you are taking this medicine. Discuss the foods you eat and the vitamins you take with your health care professional. Some people who take this medicine have severe bone, joint, and/or muscle pain. This medicine may also increase your risk for jaw problems or a broken thigh bone. Tell your doctor right away if you have severe pain in your jaw, bones, joints, or muscles. Tell your doctor if you have any pain that does not go away or that gets worse. Tell your dentist and dental surgeon that you are taking this medicine. You should not have major dental surgery while on this medicine. See your dentist to have a dental exam and fix any dental problems before starting this medicine. Take good care of your teeth while on this medicine. Make sure you see your dentist for regular follow-up   appointments. What side effects may I notice from receiving this medicine? Side effects that you should report to your doctor or health care professional as soon as possible:  allergic reactions like skin rash, itching or hives, swelling of the face, lips, or tongue  anxiety, confusion, or depression  breathing problems  changes in vision  eye  pain  feeling faint or lightheaded, falls  jaw pain, especially after dental work  mouth sores  muscle cramps, stiffness, or weakness  redness, blistering, peeling or loosening of the skin, including inside the mouth  trouble passing urine or change in the amount of urine Side effects that usually do not require medical attention (report to your doctor or health care professional if they continue or are bothersome):  bone, joint, or muscle pain  constipation  diarrhea  fever  hair loss  irritation at site where injected  loss of appetite  nausea, vomiting  stomach upset  trouble sleeping  trouble swallowing  weak or tired This list may not describe all possible side effects. Call your doctor for medical advice about side effects. You may report side effects to FDA at 1-800-FDA-1088. Where should I keep my medicine? This drug is given in a hospital or clinic and will not be stored at home. NOTE: This sheet is a summary. It may not cover all possible information. If you have questions about this medicine, talk to your doctor, pharmacist, or health care provider.  2020 Elsevier/Gold Standard (2013-06-18 14:19:39)  

## 2019-02-14 LAB — KAPPA/LAMBDA LIGHT CHAINS
Kappa free light chain: 29.2 mg/L — ABNORMAL HIGH (ref 3.3–19.4)
Kappa, lambda light chain ratio: 1.58 (ref 0.26–1.65)
Lambda free light chains: 18.5 mg/L (ref 5.7–26.3)

## 2019-02-15 LAB — MULTIPLE MYELOMA PANEL, SERUM
Albumin SerPl Elph-Mcnc: 3.8 g/dL (ref 2.9–4.4)
Albumin/Glob SerPl: 1.3 (ref 0.7–1.7)
Alpha 1: 0.2 g/dL (ref 0.0–0.4)
Alpha2 Glob SerPl Elph-Mcnc: 0.6 g/dL (ref 0.4–1.0)
B-Globulin SerPl Elph-Mcnc: 0.9 g/dL (ref 0.7–1.3)
Gamma Glob SerPl Elph-Mcnc: 1.3 g/dL (ref 0.4–1.8)
Globulin, Total: 3 g/dL (ref 2.2–3.9)
IgA: 155 mg/dL (ref 61–437)
IgG (Immunoglobin G), Serum: 1386 mg/dL (ref 603–1613)
IgM (Immunoglobulin M), Srm: 41 mg/dL (ref 20–172)
Total Protein ELP: 6.8 g/dL (ref 6.0–8.5)

## 2019-02-22 ENCOUNTER — Other Ambulatory Visit: Payer: Self-pay | Admitting: Oncology

## 2019-03-11 ENCOUNTER — Telehealth: Payer: Self-pay | Admitting: Oncology

## 2019-03-11 ENCOUNTER — Inpatient Hospital Stay: Payer: Medicare Other | Attending: Oncology

## 2019-03-11 ENCOUNTER — Inpatient Hospital Stay (HOSPITAL_BASED_OUTPATIENT_CLINIC_OR_DEPARTMENT_OTHER): Payer: Medicare Other | Admitting: Oncology

## 2019-03-11 ENCOUNTER — Other Ambulatory Visit: Payer: Self-pay

## 2019-03-11 VITALS — BP 174/90 | HR 85 | Temp 98.7°F | Resp 17 | Ht 72.0 in | Wt 211.7 lb

## 2019-03-11 DIAGNOSIS — M879 Osteonecrosis, unspecified: Secondary | ICD-10-CM | POA: Diagnosis not present

## 2019-03-11 DIAGNOSIS — C9000 Multiple myeloma not having achieved remission: Secondary | ICD-10-CM

## 2019-03-11 LAB — CMP (CANCER CENTER ONLY)
ALT: 19 U/L (ref 0–44)
AST: 37 U/L (ref 15–41)
Albumin: 4.3 g/dL (ref 3.5–5.0)
Alkaline Phosphatase: 31 U/L — ABNORMAL LOW (ref 38–126)
Anion gap: 10 (ref 5–15)
BUN: 9 mg/dL (ref 8–23)
CO2: 26 mmol/L (ref 22–32)
Calcium: 9.6 mg/dL (ref 8.9–10.3)
Chloride: 107 mmol/L (ref 98–111)
Creatinine: 0.98 mg/dL (ref 0.61–1.24)
GFR, Est AFR Am: >60 mL/min (ref >60)
GFR, Estimated: >60 mL/min (ref >60)
Glucose, Bld: 102 mg/dL — ABNORMAL HIGH (ref 70–99)
Potassium: 3.9 mmol/L (ref 3.5–5.1)
Sodium: 143 mmol/L (ref 135–145)
Total Bilirubin: 0.8 mg/dL (ref 0.3–1.2)
Total Protein: 7.7 g/dL (ref 6.5–8.1)

## 2019-03-11 LAB — CBC WITH DIFFERENTIAL (CANCER CENTER ONLY)
Abs Immature Granulocytes: 0.02 10*3/uL (ref 0.00–0.07)
Basophils Absolute: 0.1 10*3/uL (ref 0.0–0.1)
Basophils Relative: 1 %
Eosinophils Absolute: 0.3 10*3/uL (ref 0.0–0.5)
Eosinophils Relative: 5 %
HCT: 38.6 % — ABNORMAL LOW (ref 39.0–52.0)
Hemoglobin: 12.9 g/dL — ABNORMAL LOW (ref 13.0–17.0)
Immature Granulocytes: 0 %
Lymphocytes Relative: 17 %
Lymphs Abs: 0.9 10*3/uL (ref 0.7–4.0)
MCH: 32.4 pg (ref 26.0–34.0)
MCHC: 33.4 g/dL (ref 30.0–36.0)
MCV: 97 fL (ref 80.0–100.0)
Monocytes Absolute: 0.7 10*3/uL (ref 0.1–1.0)
Monocytes Relative: 12 %
Neutro Abs: 3.5 10*3/uL (ref 1.7–7.7)
Neutrophils Relative %: 65 %
Platelet Count: 316 10*3/uL (ref 150–400)
RBC: 3.98 MIL/uL — ABNORMAL LOW (ref 4.22–5.81)
RDW: 15.3 % (ref 11.5–15.5)
WBC Count: 5.4 10*3/uL (ref 4.0–10.5)
nRBC: 0 % (ref 0.0–0.2)

## 2019-03-11 NOTE — Progress Notes (Signed)
Hematology and Oncology Follow Up Visit  Alexander Saunders [id number] Apr 10, 1950 69 y.o. 03/11/2019 1:52 PM Alexander Saunders MDOsei-Bonsu, [name], MD   Principle Diagnosis: 69 year old man with kappa light chain multiple myeloma with bone involvement diagnosed in 2018. . Prior Therapy:  He is status post surgical decompression of an epidural mass on 09/04/2016 and the pathology showed a plasma cell neoplasm.  He is S/P adjuvant radiation therapy to the thoracic spine to be completed on 10/14/2016.  Velcade 1.5 mg/m weekly with dexamethasone 20 mg and Cytoxan 600 mg po. Therapy started on 10/24/2016.   High-dose chemotherapy followed by stem cell transplant: Melphalan 200 mg/m2 completed on 06/17/17 followed by autologous stem cell rescue 06/18/17.  He achieved complete response.      Current therapy:   Zometa 4 mg every 3 months started in February 2020.  Revlimid 10 mg daily for 21 days out of a 28-day started in February 2020.     Interim History:  Mr. Alexander Saunders is here for repeat evaluation.  Since the last visit, he reports no major changes in his health.  He does report some occasional constipation and neuropathy which is chronic in nature.  He denies any recent hospitalization or illnesses.  He denies any bone pain or pathological fractures.  He continues to attend to activities of daily living and continues to attend school.      Medications: Updated on review. Current Outpatient Medications  Medication Sig Dispense Refill  . aspirin EC 81 MG tablet Take 81 mg by mouth daily.    [name] gabapentin (NEURONTIN) 300 MG capsule Take 1,800 mg by mouth daily. 3 caps in am 3 caps in pm    . Multiple Vitamin (MULTIVITAMIN) capsule Take 1 capsule by mouth daily.    [name] OVER THE COUNTER MEDICATION CBD tincture 1000 mg daily    . polyethylene glycol powder (GLYCOLAX/MIRALAX) 17 GM/SCOOP powder Take by mouth.    . REVLIMID 10 MG capsule TAKE 1 CAPSULE DAILY FOR 21 DAYS AND OFF FOR 7 DAYS 21  capsule 0  . tiZANidine (ZANAFLEX) 2 MG tablet TAKE 1 TABLET(2 MG) BY MOUTH AT BEDTIME AS NEEDED FOR MUSCLE SPASMS 30 tablet 5  . tiZANidine (ZANAFLEX) 2 MG tablet Take by mouth.    [name] VIAGRA 100 MG tablet      No current facility-administered medications for this visit.     Allergies: No Known Allergies    Physical Exam:    Blood pressure (!) 174/90, pulse 85, temperature 98.7 F (37.1 C), temperature source Temporal, resp. rate 17, height 6' (1.829 m), weight 211 lb 11.2 oz (96 kg), SpO2 100 %.     ECOG: 1   General appearance: Comfortable appearing without any discomfort Head: Normocephalic without any trauma Oropharynx: Mucous membranes are moist and pink without any thrush or ulcers. Eyes: Pupils are equal and round reactive to light. Lymph nodes: No cervical, supraclavicular, inguinal or axillary lymphadenopathy.   Heart:regular rate and rhythm.  S1 and S2 without leg edema. Lung: Clear without any rhonchi or wheezes.  No dullness to percussion. Abdomin: Soft, nontender, nondistended with good bowel sounds.  No hepatosplenomegaly. Musculoskeletal: No joint deformity or effusion.  Full range of motion noted. Neurological: No deficits noted on motor, sensory and deep tendon reflex exam. Skin: No petechial rash or dryness.  Appeared moist.         Lab Results: Lab Results  Component Value Date   WBC 5.4 03/11/2019   HGB 12.9 (L) 03/11/2019   HCT  38.6 (L) 03/11/2019   MCV 97.0 03/11/2019   PLT 316 03/11/2019     Chemistry      Component Value Date/Time   NA 141 02/11/2019 0858   NA 137 02/06/2017 0828   K 3.6 02/11/2019 0858   K 4.0 02/06/2017 0828   CL 107 02/11/2019 0858   CO2 24 02/11/2019 0858   CO2 24 02/06/2017 0828   BUN 8 02/11/2019 0858   BUN 14.9 02/06/2017 0828   CREATININE 0.93 02/11/2019 0858   CREATININE 0.9 02/06/2017 0828      Component Value Date/Time   CALCIUM 8.9 02/11/2019 0858   CALCIUM 9.1 02/06/2017 0828   ALKPHOS 31 (L)  02/11/2019 0858   ALKPHOS 31 (L) 02/06/2017 0828   AST 16 02/11/2019 0858   AST 32 02/06/2017 0828   ALT 12 02/11/2019 0858   ALT 43 02/06/2017 0828   BILITOT 0.5 02/11/2019 0858   BILITOT 0.42 02/06/2017 0828      Results for Alexander Saunders (MRN [id number]) as of 03/11/2019 13:54  Ref. Range 01/14/2019 14:55 02/11/2019 08:59  M Protein SerPl Elph-Mcnc Latest Ref Range: Not Observed g/dL Not Observed Not Observed  IFE 1 Unknown Comment Comment  Globulin, Total Latest Ref Range: 2.2 - 3.9 g/dL 3.1 3.0  B-Globulin SerPl Elph-Mcnc Latest Ref Range: 0.7 - 1.3 g/dL 0.9 0.9  IgG (Immunoglobin G), Serum Latest Ref Range: 603 - 1,613 mg/dL 1,404 1,386  IgM (Immunoglobulin M), Srm Latest Ref Range: 20 - 172 mg/dL 55 41  IgA Latest Ref Range: 61 - 437 mg/dL 175 155  Results for Alexander Saunders (MRN [id number]) as of 03/11/2019 13:54  Ref. Range 02/11/2019 08:58  Kappa free light chain Latest Ref Range: 3.3 - 19.4 mg/L 29.2 (H)  Lamda free light chains Latest Ref Range: 5.7 - 26.3 mg/L 18.5  Kappa, lamda light chain ratio Latest Ref Range: 0.26 - 1.65  1.58       Impression and Plan:  69 year old man with:  1.  Kappa light chain multiple myeloma with bone disease diagnosed in 2018.    He has achieved complete response after induction therapy and high-dose chemotherapy and stem cell transplant.  Protein studies obtained on February 11, 2019 were personally reviewed and showed no evidence of disease relapse.   Th course of this disease was reviewed today and alternative treatment options as well as signs of relapse were reviewed.  I recommended continued maintenance Revlimid for at least another 12 months and discontinuation after that.   2.  Bone health: He is currently on Zometa which will be repeated every 3 months.  Osteonecrosis of the jaw and hypocalcemia complications with discussed.   3.  Neuropathy and chronic back pain: No changes at this time continues to be on Neurontin.  4.   Immunization: He is up-to-date for his post transplant immunization.  5.  Covid considerations: He has received the first of the 2 Covid vaccination.  6. Follow-up: In 2 months for repeat evaluation and Zometa.  30 minutes was spent on this encounter today.  The time was dedicated to reviewing laboratory data, disease status update as well as coordinating plan of care.   Zola Button, MD 2/5/20211:52 PM

## 2019-03-11 NOTE — Telephone Encounter (Signed)
Called pt to notify him that he left his wallet. No answer. Contacted pts wife Cloyd Stagers), confirmed that it would be at the front desk.

## 2019-03-11 NOTE — Telephone Encounter (Signed)
Scheduled appt per 2/5 los.  Sent a message to HIM pool to get a calendar mailed out. 

## 2019-03-14 ENCOUNTER — Encounter: Payer: Self-pay | Admitting: Gastroenterology

## 2019-03-14 LAB — KAPPA/LAMBDA LIGHT CHAINS
Kappa free light chain: 23.1 mg/L — ABNORMAL HIGH (ref 3.3–19.4)
Kappa, lambda light chain ratio: 1.08 (ref 0.26–1.65)
Lambda free light chains: 21.4 mg/L (ref 5.7–26.3)

## 2019-03-15 LAB — MULTIPLE MYELOMA PANEL, SERUM
Albumin SerPl Elph-Mcnc: 4 g/dL (ref 2.9–4.4)
Albumin/Glob SerPl: 1.2 (ref 0.7–1.7)
Alpha 1: 0.2 g/dL (ref 0.0–0.4)
Alpha2 Glob SerPl Elph-Mcnc: 0.7 g/dL (ref 0.4–1.0)
B-Globulin SerPl Elph-Mcnc: 1.1 g/dL (ref 0.7–1.3)
Gamma Glob SerPl Elph-Mcnc: 1.4 g/dL (ref 0.4–1.8)
Globulin, Total: 3.4 g/dL (ref 2.2–3.9)
IgA: 168 mg/dL (ref 61–437)
IgG (Immunoglobin G), Serum: 1429 mg/dL (ref 603–1613)
IgM (Immunoglobulin M), Srm: 41 mg/dL (ref 20–172)
Total Protein ELP: 7.4 g/dL (ref 6.0–8.5)

## 2019-03-21 ENCOUNTER — Other Ambulatory Visit: Payer: Self-pay

## 2019-03-21 MED ORDER — REVLIMID 10 MG PO CAPS: ORAL_CAPSULE | ORAL | 0 refills | Status: DC

## 2019-03-25 ENCOUNTER — Other Ambulatory Visit: Payer: Self-pay

## 2019-03-25 MED ORDER — REVLIMID 10 MG PO CAPS: ORAL_CAPSULE | ORAL | 0 refills | Status: DC

## 2019-04-07 ENCOUNTER — Other Ambulatory Visit: Payer: Self-pay

## 2019-04-07 MED ORDER — GABAPENTIN 300 MG PO CAPS: 1800.0000 mg | ORAL_CAPSULE | Freq: Every day | ORAL | 3 refills | Status: DC

## 2019-04-11 ENCOUNTER — Ambulatory Visit (AMBULATORY_SURGERY_CENTER): Payer: Self-pay | Admitting: *Deleted

## 2019-04-11 ENCOUNTER — Other Ambulatory Visit: Payer: Self-pay

## 2019-04-11 VITALS — Temp 98.3°F | Ht 72.0 in | Wt 208.0 lb

## 2019-04-11 DIAGNOSIS — Z86010 Personal history of colonic polyps: Secondary | ICD-10-CM

## 2019-04-11 MED ORDER — PEG 3350-KCL-NA BICARB-NACL 420 G PO SOLR: 4000.0000 mL | Freq: Once | ORAL | 0 refills | Status: AC

## 2019-04-11 MED FILL — PEG-3350 SOLUTION: 420 | 1 days supply | Qty: 4000 | Fill #0

## 2019-04-11 NOTE — Progress Notes (Signed)

## 2019-04-21 DIAGNOSIS — R350 Frequency of micturition: Secondary | ICD-10-CM | POA: Diagnosis not present

## 2019-04-21 DIAGNOSIS — G620 Drug-induced polyneuropathy: Secondary | ICD-10-CM | POA: Diagnosis not present

## 2019-04-21 DIAGNOSIS — Z9484 Stem cells transplant status: Secondary | ICD-10-CM | POA: Diagnosis not present

## 2019-04-21 DIAGNOSIS — D638 Anemia in other chronic diseases classified elsewhere: Secondary | ICD-10-CM | POA: Diagnosis not present

## 2019-04-21 DIAGNOSIS — I10 Essential (primary) hypertension: Secondary | ICD-10-CM | POA: Diagnosis not present

## 2019-04-21 DIAGNOSIS — Z131 Encounter for screening for diabetes mellitus: Secondary | ICD-10-CM | POA: Diagnosis not present

## 2019-04-21 DIAGNOSIS — Z8579 Personal history of other malignant neoplasms of lymphoid, hematopoietic and related tissues: Secondary | ICD-10-CM | POA: Diagnosis not present

## 2019-04-21 DIAGNOSIS — E785 Hyperlipidemia, unspecified: Secondary | ICD-10-CM | POA: Diagnosis not present

## 2019-04-22 DIAGNOSIS — Z131 Encounter for screening for diabetes mellitus: Secondary | ICD-10-CM | POA: Diagnosis not present

## 2019-04-22 DIAGNOSIS — Z9484 Stem cells transplant status: Secondary | ICD-10-CM | POA: Diagnosis not present

## 2019-04-22 DIAGNOSIS — E785 Hyperlipidemia, unspecified: Secondary | ICD-10-CM | POA: Diagnosis not present

## 2019-04-22 DIAGNOSIS — D638 Anemia in other chronic diseases classified elsewhere: Secondary | ICD-10-CM | POA: Diagnosis not present

## 2019-04-22 DIAGNOSIS — Z8579 Personal history of other malignant neoplasms of lymphoid, hematopoietic and related tissues: Secondary | ICD-10-CM | POA: Diagnosis not present

## 2019-04-22 DIAGNOSIS — G620 Drug-induced polyneuropathy: Secondary | ICD-10-CM | POA: Diagnosis not present

## 2019-04-22 DIAGNOSIS — R350 Frequency of micturition: Secondary | ICD-10-CM | POA: Diagnosis not present

## 2019-04-23 ENCOUNTER — Other Ambulatory Visit: Payer: Self-pay | Admitting: Oncology

## 2019-04-25 ENCOUNTER — Encounter: Payer: Self-pay | Admitting: Gastroenterology

## 2019-04-25 ENCOUNTER — Ambulatory Visit (AMBULATORY_SURGERY_CENTER): Payer: Medicare Other | Admitting: Gastroenterology

## 2019-04-25 ENCOUNTER — Other Ambulatory Visit: Payer: Self-pay

## 2019-04-25 VITALS — BP 129/72 | HR 63 | Temp 96.9°F | Resp 13 | Ht 72.0 in | Wt 208.0 lb

## 2019-04-25 DIAGNOSIS — D120 Benign neoplasm of cecum: Secondary | ICD-10-CM | POA: Diagnosis not present

## 2019-04-25 DIAGNOSIS — Z86010 Personal history of colonic polyps: Secondary | ICD-10-CM

## 2019-04-25 DIAGNOSIS — D122 Benign neoplasm of ascending colon: Secondary | ICD-10-CM

## 2019-04-25 DIAGNOSIS — D123 Benign neoplasm of transverse colon: Secondary | ICD-10-CM | POA: Diagnosis not present

## 2019-04-25 DIAGNOSIS — Z1211 Encounter for screening for malignant neoplasm of colon: Secondary | ICD-10-CM | POA: Diagnosis not present

## 2019-04-25 MED ORDER — SODIUM CHLORIDE 0.9 % IV SOLN: 500.0000 mL | Freq: Once | INTRAVENOUS | Status: DC

## 2019-04-25 MED ORDER — REVLIMID 10 MG PO CAPS: ORAL_CAPSULE | ORAL | 0 refills | Status: DC

## 2019-04-25 NOTE — Op Note (Signed)
[facility] Patient Name: Alexander Saunders Procedure Date: 04/25/2019 10:42 AM MRN: [id number] Endoscopist: Ladene Artist , MD Age: 69 Referring MD:  Date of Birth: [birth year] Gender: Male Account #: [id number] Procedure:                Colonoscopy Indications:              High risk colon cancer surveillance: Personal                            history of sessile serrated colon polyp (10 mm or                            greater in size) removed piecemeal 6 months ago. Medicines:                Monitored Anesthesia Care Procedure:                Pre-Anesthesia Assessment:                           - Prior to the procedure, a History and Physical                            was performed, and patient medications and                            allergies were reviewed. The patient's tolerance of                            previous anesthesia was also reviewed. The risks                            and benefits of the procedure and the sedation                            options and risks were discussed with the patient.                            All questions were answered, and informed consent                            was obtained. Prior Anticoagulants: The patient has                            taken no previous anticoagulant or antiplatelet                            agents. ASA Grade Assessment: II - A patient with                            mild systemic disease. After reviewing the risks                            and benefits, the patient was deemed in  satisfactory condition to undergo the procedure.                           After obtaining informed consent, the colonoscope                            was passed under direct vision. Throughout the                            procedure, the patient's blood pressure, pulse, and                            oxygen saturations were monitored continuously. The                            Colonoscope was  introduced through the anus and                            advanced to the the cecum, identified by                            appendiceal orifice and ileocecal valve. The                            ileocecal valve, appendiceal orifice, and rectum                            were photographed. The quality of the bowel                            preparation was good, better than 6 months ago. The                            patient tolerated the procedure well. The                            colonoscopy was somewhat difficult due to a                            redundant colon and a tortuous colon. Scope In: 10:49:36 AM Scope Out: 11:09:48 AM Scope Withdrawal Time: 0 hours 15 minutes 57 seconds  Total Procedure Duration: 0 hours 20 minutes 12 seconds  Findings:                 The perianal and digital rectal examinations were                            normal.                           A 3 mm polyp was found in the ileocecal valve. The                            polyp was sessile. The polyp was removed with a  cold biopsy forceps. Resection and retrieval were                            complete.                           Two sessile polyps were found in the transverse                            colon and ascending colon. The polyps were 4 mm in                            size. These polyps were removed with a cold snare.                            Resection and retrieval were complete.                           External and internal hemorrhoids were found during                            retroflexion. The hemorrhoids were small and Grade                            I (internal hemorrhoids that do not prolapse).                           The exam was otherwise without abnormality on                            direct and retroflexion views. Cecum was carefully                            viewed - no residual polyp and the prior                            polypectomy  site could not be located. Complications:            No immediate complications. Estimated blood loss:                            None. Estimated Blood Loss:     Estimated blood loss: none. Impression:               - One 3 mm polyp at the ileocecal valve, removed                            with a cold biopsy forceps. Resected and retrieved.                           - Two 4 mm polyps in the transverse colon and in                            the ascending colon, removed with a cold snare.  Resected and retrieved.                           - External and internal hemorrhoids.                           - The examination was otherwise normal on direct                            and retroflexion views. Recommendation:           - Repeat colonoscopy in 3 years for surveillance.                           - Patient has a contact number available for                            emergencies. The signs and symptoms of potential                            delayed complications were discussed with the                            patient. Return to normal activities tomorrow.                            Written discharge instructions were provided to the                            patient.                           - Resume previous diet.                           - Continue present medications.                           - Await pathology results. Ladene Artist, MD 04/25/2019 11:16:37 AM This report has been signed electronically.

## 2019-04-25 NOTE — Progress Notes (Signed)
A and O x3. Report to RN. Tolerated MAC anesthesia well.

## 2019-04-25 NOTE — Progress Notes (Signed)
Called to room to assist during endoscopic procedure.  Patient ID and intended procedure confirmed with present staff. Received instructions for my participation in the procedure from the performing physician.  

## 2019-04-25 NOTE — Progress Notes (Signed)
Temperature taken by L.C., VS taken by C.W. 

## 2019-04-25 NOTE — Progress Notes (Signed)
Pt's states no medical or surgical changes since previsit or office visit. 

## 2019-04-25 NOTE — Patient Instructions (Signed)
Impression/Recommendations: ° °Polyp handout given to patient. °Hemorrhoid handout given to patient. ° °Repeat colonoscopy in 3 years for surveillance. ° °Resume previous diet. °Continue present medications. °Await pathology results. ° °YOU HAD AN ENDOSCOPIC PROCEDURE TODAY AT THE [facility]:   Refer to the procedure report that was given to you for any specific questions about what was found during the examination.  If the procedure report does not answer your questions, please call your gastroenterologist to clarify.  If you requested that your care partner not be given the details of your procedure findings, then the procedure report has been included in a sealed envelope for you to review at your convenience later. ° °YOU SHOULD EXPECT: Some feelings of bloating in the abdomen. Passage of more gas than usual.  Walking can help get rid of the air that was put into your GI tract during the procedure and reduce the bloating. If you had a lower endoscopy (such as a colonoscopy or flexible sigmoidoscopy) you may notice spotting of blood in your stool or on the toilet paper. If you underwent a bowel prep for your procedure, you may not have a normal bowel movement for a few days. ° °Please Note:  You might notice some irritation and congestion in your nose or some drainage.  This is from the oxygen used during your procedure.  There is no need for concern and it should clear up in a day or so. ° °SYMPTOMS TO REPORT IMMEDIATELY: ° °· Following lower endoscopy (colonoscopy or flexible sigmoidoscopy): ° Excessive amounts of blood in the stool ° Significant tenderness or worsening of abdominal pains ° Swelling of the abdomen that is new, acute ° Fever of 100°F or higher ° °For urgent or emergent issues, a gastroenterologist can be reached at any hour by calling [phone]. °Do not use MyChart messaging for urgent concerns.  ° ° °DIET:  We do recommend a small meal at first, but then you may proceed to  your regular diet.  Drink plenty of fluids but you should avoid alcoholic beverages for 24 hours. ° °ACTIVITY:  You should plan to take it easy for the rest of today and you should NOT DRIVE or use heavy machinery until tomorrow (because of the sedation medicines used during the test).   ° °FOLLOW UP: °Our staff will call the number listed on your records 48-72 hours following your procedure to check on you and address any questions or concerns that you may have regarding the information given to you following your procedure. If we do not reach you, we will leave a message.  We will attempt to reach you two times.  During this call, we will ask if you have developed any symptoms of COVID 19. If you develop any symptoms (ie: fever, flu-like symptoms, shortness of breath, cough etc.) before then, please call [phone].  If you test positive for Covid 19 in the 2 weeks post procedure, please call and report this information to us.   ° °If any biopsies were taken you will be contacted by phone or by letter within the next 1-3 weeks.  Please call us at [phone] if you have not heard about the biopsies in 3 weeks.  ° ° °SIGNATURES/CONFIDENTIALITY: °You and/or your care partner have signed paperwork which will be entered into your electronic medical record.  These signatures attest to the fact that that the information above on your After Visit Summary has been reviewed and is understood.  Full responsibility of   of the confidentiality of this discharge information lies with you and/or your care-partner.

## 2019-04-27 ENCOUNTER — Telehealth: Payer: Self-pay | Admitting: *Deleted

## 2019-04-27 NOTE — Telephone Encounter (Signed)
Message left

## 2019-05-04 ENCOUNTER — Encounter: Payer: Self-pay | Admitting: Gastroenterology

## 2019-05-10 ENCOUNTER — Inpatient Hospital Stay: Payer: Medicare Other

## 2019-05-10 ENCOUNTER — Inpatient Hospital Stay: Payer: Medicare Other | Attending: Oncology

## 2019-05-10 ENCOUNTER — Inpatient Hospital Stay (HOSPITAL_BASED_OUTPATIENT_CLINIC_OR_DEPARTMENT_OTHER): Payer: Medicare Other | Admitting: Oncology

## 2019-05-10 ENCOUNTER — Other Ambulatory Visit: Payer: Self-pay

## 2019-05-10 VITALS — BP 148/79 | HR 61 | Temp 98.3°F | Resp 17 | Ht 72.0 in | Wt 210.0 lb

## 2019-05-10 DIAGNOSIS — G8929 Other chronic pain: Secondary | ICD-10-CM | POA: Insufficient documentation

## 2019-05-10 DIAGNOSIS — C9000 Multiple myeloma not having achieved remission: Secondary | ICD-10-CM | POA: Insufficient documentation

## 2019-05-10 DIAGNOSIS — G629 Polyneuropathy, unspecified: Secondary | ICD-10-CM | POA: Diagnosis not present

## 2019-05-10 DIAGNOSIS — M549 Dorsalgia, unspecified: Secondary | ICD-10-CM | POA: Diagnosis not present

## 2019-05-10 LAB — CBC WITH DIFFERENTIAL (CANCER CENTER ONLY)
Abs Immature Granulocytes: 0.02 10*3/uL (ref 0.00–0.07)
Basophils Absolute: 0.1 10*3/uL (ref 0.0–0.1)
Basophils Relative: 1 %
Eosinophils Absolute: 0.5 10*3/uL (ref 0.0–0.5)
Eosinophils Relative: 11 %
HCT: 39.6 % (ref 39.0–52.0)
Hemoglobin: 13.2 g/dL (ref 13.0–17.0)
Immature Granulocytes: 0 %
Lymphocytes Relative: 18 %
Lymphs Abs: 0.9 10*3/uL (ref 0.7–4.0)
MCH: 32.1 pg (ref 26.0–34.0)
MCHC: 33.3 g/dL (ref 30.0–36.0)
MCV: 96.4 fL (ref 80.0–100.0)
Monocytes Absolute: 0.4 10*3/uL (ref 0.1–1.0)
Monocytes Relative: 8 %
Neutro Abs: 3 10*3/uL (ref 1.7–7.7)
Neutrophils Relative %: 62 %
Platelet Count: 258 10*3/uL (ref 150–400)
RBC: 4.11 MIL/uL — ABNORMAL LOW (ref 4.22–5.81)
RDW: 13.8 % (ref 11.5–15.5)
WBC Count: 4.9 10*3/uL (ref 4.0–10.5)
nRBC: 0 % (ref 0.0–0.2)

## 2019-05-10 LAB — CMP (CANCER CENTER ONLY)
ALT: 13 U/L (ref 0–44)
AST: 18 U/L (ref 15–41)
Albumin: 3.9 g/dL (ref 3.5–5.0)
Alkaline Phosphatase: 30 U/L — ABNORMAL LOW (ref 38–126)
Anion gap: 8 (ref 5–15)
BUN: 11 mg/dL (ref 8–23)
CO2: 27 mmol/L (ref 22–32)
Calcium: 9.5 mg/dL (ref 8.9–10.3)
Chloride: 106 mmol/L (ref 98–111)
Creatinine: 0.9 mg/dL (ref 0.61–1.24)
GFR, Est AFR Am: >60 mL/min (ref >60)
GFR, Estimated: >60 mL/min (ref >60)
Glucose, Bld: 93 mg/dL (ref 70–99)
Potassium: 3.9 mmol/L (ref 3.5–5.1)
Sodium: 141 mmol/L (ref 135–145)
Total Bilirubin: 0.5 mg/dL (ref 0.3–1.2)
Total Protein: 7.2 g/dL (ref 6.5–8.1)

## 2019-05-10 MED ORDER — SODIUM CHLORIDE 0.9 % IV SOLN
INTRAVENOUS | Status: DC
  Filled 2019-05-10: qty 250

## 2019-05-10 MED ORDER — ZOLEDRONIC ACID 4 MG/100ML IV SOLN
INTRAVENOUS | Status: AC
  Filled 2019-05-10: qty 100

## 2019-05-10 MED ORDER — ZOLEDRONIC ACID 4 MG/100ML IV SOLN
4.0000 mg | Freq: Once | INTRAVENOUS | Status: AC
  Administered 2019-05-10: 4 mg via INTRAVENOUS

## 2019-05-10 NOTE — Patient Instructions (Signed)
Zoledronic Acid injection (Hypercalcemia, Oncology) What is this medicine? ZOLEDRONIC ACID (ZOE le dron ik AS id) lowers the amount of calcium loss from bone. It is used to treat too much calcium in your blood from cancer. It is also used to prevent complications of cancer that has spread to the bone. This medicine may be used for other purposes; ask your health care provider or pharmacist if you have questions. COMMON BRAND NAME(S): Zometa What should I tell my health care provider before I take this medicine? They need to know if you have any of these conditions:  aspirin-sensitive asthma  cancer, especially if you are receiving medicines used to treat cancer  dental disease or wear dentures  infection  kidney disease  receiving corticosteroids like dexamethasone or prednisone  an unusual or allergic reaction to zoledronic acid, other medicines, foods, dyes, or preservatives  pregnant or trying to get pregnant  breast-feeding How should I use this medicine? This medicine is for infusion into a vein. It is given by a health care professional in a hospital or clinic setting. Talk to your pediatrician regarding the use of this medicine in children. Special care may be needed. Overdosage: If you think you have taken too much of this medicine contact a poison control center or emergency room at once. NOTE: This medicine is only for you. Do not share this medicine with others. What if I miss a dose? It is important not to miss your dose. Call your doctor or health care professional if you are unable to keep an appointment. What may interact with this medicine?  certain antibiotics given by injection  NSAIDs, medicines for pain and inflammation, like ibuprofen or naproxen  some diuretics like bumetanide, furosemide  teriparatide  thalidomide This list may not describe all possible interactions. Give your health care provider a list of all the medicines, herbs, non-prescription  drugs, or dietary supplements you use. Also tell them if you smoke, drink alcohol, or use illegal drugs. Some items may interact with your medicine. What should I watch for while using this medicine? Visit your doctor or health care professional for regular checkups. It may be some time before you see the benefit from this medicine. Do not stop taking your medicine unless your doctor tells you to. Your doctor may order blood tests or other tests to see how you are doing. Women should inform their doctor if they wish to become pregnant or think they might be pregnant. There is a potential for serious side effects to an unborn child. Talk to your health care professional or pharmacist for more information. You should make sure that you get enough calcium and vitamin D while you are taking this medicine. Discuss the foods you eat and the vitamins you take with your health care professional. Some people who take this medicine have severe bone, joint, and/or muscle pain. This medicine may also increase your risk for jaw problems or a broken thigh bone. Tell your doctor right away if you have severe pain in your jaw, bones, joints, or muscles. Tell your doctor if you have any pain that does not go away or that gets worse. Tell your dentist and dental surgeon that you are taking this medicine. You should not have major dental surgery while on this medicine. See your dentist to have a dental exam and fix any dental problems before starting this medicine. Take good care of your teeth while on this medicine. Make sure you see your dentist for regular follow-up   appointments. What side effects may I notice from receiving this medicine? Side effects that you should report to your doctor or health care professional as soon as possible:  allergic reactions like skin rash, itching or hives, swelling of the face, lips, or tongue  anxiety, confusion, or depression  breathing problems  changes in vision  eye  pain  feeling faint or lightheaded, falls  jaw pain, especially after dental work  mouth sores  muscle cramps, stiffness, or weakness  redness, blistering, peeling or loosening of the skin, including inside the mouth  trouble passing urine or change in the amount of urine Side effects that usually do not require medical attention (report to your doctor or health care professional if they continue or are bothersome):  bone, joint, or muscle pain  constipation  diarrhea  fever  hair loss  irritation at site where injected  loss of appetite  nausea, vomiting  stomach upset  trouble sleeping  trouble swallowing  weak or tired This list may not describe all possible side effects. Call your doctor for medical advice about side effects. You may report side effects to FDA at 1-800-FDA-1088. Where should I keep my medicine? This drug is given in a hospital or clinic and will not be stored at home. NOTE: This sheet is a summary. It may not cover all possible information. If you have questions about this medicine, talk to your doctor, pharmacist, or health care provider.  2020 Elsevier/Gold Standard (2013-06-18 14:19:39)  

## 2019-05-10 NOTE — Progress Notes (Signed)
Hematology and Oncology Follow Up Visit  Alexander Saunders [id number] 04/21/1950 69 y.o. 05/10/2019 12:07 PM Saunders, Alexander Beard MDOsei-Bonsu, Alexander Beard, MD   Principle Diagnosis: 69 year old man with multiple myeloma diagnosed in 2018.  He presented with kappa light chain and bone disease.   Prior Therapy:  He is status post surgical decompression of an epidural mass on 09/04/2016 and the pathology showed a plasma cell neoplasm.  He is S/P adjuvant radiation therapy to the thoracic spine to be completed on 10/14/2016.  Velcade 1.5 mg/m weekly with dexamethasone 20 mg and Cytoxan 600 mg po. Therapy started on 10/24/2016.   High-dose chemotherapy followed by stem cell transplant: Melphalan 200 mg/m2 completed on 06/17/17 followed by autologous stem cell rescue 06/18/17.  He achieved complete response.      Current therapy:   Zometa 4 mg every 3 months started in February 2020.  Revlimid 10 mg daily for 21 days out of a 28-day started in February 2020.     Interim History:  Alexander Saunders is here for a follow-up visit.  Since the last visit, he continues to tolerate Revlimid without any major complaints.  He denies any nausea, vomiting or abdominal pain.  He denies any worsening neuropathy at this time.  Continues to exercise regularly without any recent hospitalization or illnesses.      Medications: Updated on review. Current Outpatient Medications  Medication Sig Dispense Refill  . aspirin EC 81 MG tablet Take 81 mg by mouth daily.    . EARWAX REMOVAL 6.5 % OTIC solution     . gabapentin (NEURONTIN) 300 MG capsule Take 6 capsules (1,800 mg total) by mouth daily. 3 caps (900 mg) in am 3 caps (900 mg) in pm 180 capsule 3  . losartan (COZAAR) 25 MG tablet Take 25 mg by mouth daily.    . Multiple Vitamin (MULTIVITAMIN) capsule Take 1 capsule by mouth daily.    [name] OVER THE COUNTER MEDICATION CBD tincture 1000 mg daily    . polyethylene glycol powder (GLYCOLAX/MIRALAX) 17 GM/SCOOP powder  Take by mouth.    . REVLIMID 10 MG capsule Take one capsule daily for 21 days then 7 days off. Repeat every 28 days [employer] Auth # [id number]  04/25/2019 21 capsule 0  . tiZANidine (ZANAFLEX) 2 MG tablet TAKE 1 TABLET(2 MG) BY MOUTH AT BEDTIME AS NEEDED FOR MUSCLE SPASMS 30 tablet 5  . tiZANidine (ZANAFLEX) 2 MG tablet Take by mouth.    [name] VIAGRA 100 MG tablet      No current facility-administered medications for this visit.     Allergies: No Known Allergies    Physical Exam:    Blood pressure (!) 148/79, pulse 61, temperature 98.3 F (36.8 C), temperature source Oral, resp. rate 17, height 6' (1.829 m), weight 210 lb (95.3 kg), SpO2 100 %.     ECOG: 1    General appearance: Alert, awake without any distress. Head: Atraumatic without abnormalities Oropharynx: Without any thrush or ulcers. Eyes: No scleral icterus. Lymph nodes: No lymphadenopathy noted in the cervical, supraclavicular, or axillary nodes Heart:regular rate and rhythm, without any murmurs or gallops.   Lung: Clear to auscultation without any rhonchi, wheezes or dullness to percussion. Abdomin: Soft, nontender without any shifting dullness or ascites. Musculoskeletal: No clubbing or cyanosis. Neurological: No motor or sensory deficits. Skin: No rashes or lesions.        Lab Results: Lab Results  Component Value Date   WBC 4.9 05/10/2019   HGB 13.2 05/10/2019   HCT  39.6 05/10/2019   MCV 96.4 05/10/2019   PLT 258 05/10/2019     Chemistry      Component Value Date/Time   NA 143 03/11/2019 1327   NA 137 02/06/2017 0828   K 3.9 03/11/2019 1327   K 4.0 02/06/2017 0828   CL 107 03/11/2019 1327   CO2 26 03/11/2019 1327   CO2 24 02/06/2017 0828   BUN 9 03/11/2019 1327   BUN 14.9 02/06/2017 0828   CREATININE 0.98 03/11/2019 1327   CREATININE 0.9 02/06/2017 0828      Component Value Date/Time   CALCIUM 9.6 03/11/2019 1327   CALCIUM 9.1 02/06/2017 0828   ALKPHOS 31 (L) 03/11/2019 1327   ALKPHOS 31  (L) 02/06/2017 0828   AST 37 03/11/2019 1327   AST 32 02/06/2017 0828   ALT 19 03/11/2019 1327   ALT 43 02/06/2017 0828   BILITOT 0.8 03/11/2019 1327   BILITOT 0.42 02/06/2017 0828       Results for Alexander Saunders, Alexander Saunders (MRN [id number]) as of 05/10/2019 12:08  Ref. Range 02/11/2019 08:59 03/11/2019 13:27  M Protein SerPl Elph-Mcnc Latest Ref Range: Not Observed g/dL Not Observed Not Observed  IFE 1 Unknown Comment Comment  Globulin, Total Latest Ref Range: 2.2 - 3.9 g/dL 3.0 3.4  B-Globulin SerPl Elph-Mcnc Latest Ref Range: 0.7 - 1.3 g/dL 0.9 1.1  IgG (Immunoglobin G), Serum Latest Ref Range: 603 - 1,613 mg/dL 1,386 1,429  IgM (Immunoglobulin M), Srm Latest Ref Range: 20 - 172 mg/dL 41 41  IgA Latest Ref Range: 61 - 437 mg/dL 155 168     Impression and Plan:  69 year old man with:  1.  Multiple myeloma diagnosed in 2018.  He presented with Kappa light chain and bone disease.  He has tolerated therapy without any major complications utilizing Revlimid maintenance.  The natural course of this disease and treatment options for the future were reiterated.  At this time I recommended completing 1 year of Revlimid maintenance and possibly observation after that.  Salvage therapy with other options will be used if he developed relapsed disease.  Protein studies from February 5 showed complete response at this time.   2.  Bone health: He continues to tolerate Zometa without any complications.  Osteonecrosis of the jaw and hypocalcemia potential complications continue to be reviewed..   3.  Neuropathy and chronic back pain: Currently on Neurontin and manageable at this time.  4.  Immunization: He is up-to-date on all immunizations occluding Covid vaccine.  5. Follow-up: He will return in 3 months for repeat follow-up.  30 minutes were dedicated to this encounter.  The time was spent on reviewing laboratory data, disease status update and future treatment plans.     Zola Button,  MD 4/6/202112:07 PM

## 2019-05-11 ENCOUNTER — Telehealth: Payer: Self-pay | Admitting: Oncology

## 2019-05-11 LAB — KAPPA/LAMBDA LIGHT CHAINS
Kappa free light chain: 30.4 mg/L — ABNORMAL HIGH (ref 3.3–19.4)
Kappa, lambda light chain ratio: 1.19 (ref 0.26–1.65)
Lambda free light chains: 25.6 mg/L (ref 5.7–26.3)

## 2019-05-11 NOTE — Telephone Encounter (Signed)
Scheduled appt per 4/6 los.  Printed and mailed appt calendar 

## 2019-05-13 LAB — MULTIPLE MYELOMA PANEL, SERUM
Albumin SerPl Elph-Mcnc: 3.7 g/dL (ref 2.9–4.4)
Albumin/Glob SerPl: 1.2 (ref 0.7–1.7)
Alpha 1: 0.2 g/dL (ref 0.0–0.4)
Alpha2 Glob SerPl Elph-Mcnc: 0.7 g/dL (ref 0.4–1.0)
B-Globulin SerPl Elph-Mcnc: 1 g/dL (ref 0.7–1.3)
Gamma Glob SerPl Elph-Mcnc: 1.4 g/dL (ref 0.4–1.8)
Globulin, Total: 3.2 g/dL (ref 2.2–3.9)
IgA: 163 mg/dL (ref 61–437)
IgG (Immunoglobin G), Serum: 1410 mg/dL (ref 603–1613)
IgM (Immunoglobulin M), Srm: 35 mg/dL (ref 20–172)
Total Protein ELP: 6.9 g/dL (ref 6.0–8.5)

## 2019-05-27 ENCOUNTER — Other Ambulatory Visit: Payer: Self-pay

## 2019-05-27 MED ORDER — REVLIMID 10 MG PO CAPS: ORAL_CAPSULE | ORAL | 0 refills | Status: DC

## 2019-06-15 DIAGNOSIS — C9000 Multiple myeloma not having achieved remission: Secondary | ICD-10-CM | POA: Diagnosis not present

## 2019-06-15 DIAGNOSIS — C9001 Multiple myeloma in remission: Secondary | ICD-10-CM | POA: Diagnosis not present

## 2019-06-15 DIAGNOSIS — Z9484 Stem cells transplant status: Secondary | ICD-10-CM | POA: Diagnosis not present

## 2019-06-20 DIAGNOSIS — E785 Hyperlipidemia, unspecified: Secondary | ICD-10-CM | POA: Diagnosis not present

## 2019-06-20 DIAGNOSIS — R350 Frequency of micturition: Secondary | ICD-10-CM | POA: Diagnosis not present

## 2019-06-20 DIAGNOSIS — I10 Essential (primary) hypertension: Secondary | ICD-10-CM | POA: Diagnosis not present

## 2019-06-20 DIAGNOSIS — Z8579 Personal history of other malignant neoplasms of lymphoid, hematopoietic and related tissues: Secondary | ICD-10-CM | POA: Diagnosis not present

## 2019-06-20 DIAGNOSIS — G620 Drug-induced polyneuropathy: Secondary | ICD-10-CM | POA: Diagnosis not present

## 2019-06-20 DIAGNOSIS — Z9484 Stem cells transplant status: Secondary | ICD-10-CM | POA: Diagnosis not present

## 2019-06-20 DIAGNOSIS — D638 Anemia in other chronic diseases classified elsewhere: Secondary | ICD-10-CM | POA: Diagnosis not present

## 2019-06-22 ENCOUNTER — Other Ambulatory Visit: Payer: Self-pay | Admitting: Oncology

## 2019-07-18 ENCOUNTER — Other Ambulatory Visit: Payer: Self-pay | Admitting: Neurology

## 2019-07-19 DIAGNOSIS — Z8579 Personal history of other malignant neoplasms of lymphoid, hematopoietic and related tissues: Secondary | ICD-10-CM | POA: Diagnosis not present

## 2019-07-19 DIAGNOSIS — I10 Essential (primary) hypertension: Secondary | ICD-10-CM | POA: Diagnosis not present

## 2019-07-19 DIAGNOSIS — E785 Hyperlipidemia, unspecified: Secondary | ICD-10-CM | POA: Diagnosis not present

## 2019-07-19 DIAGNOSIS — G620 Drug-induced polyneuropathy: Secondary | ICD-10-CM | POA: Diagnosis not present

## 2019-07-19 DIAGNOSIS — D638 Anemia in other chronic diseases classified elsewhere: Secondary | ICD-10-CM | POA: Diagnosis not present

## 2019-07-27 ENCOUNTER — Other Ambulatory Visit: Payer: Self-pay

## 2019-07-27 MED ORDER — LENALIDOMIDE 10 MG PO CAPS: ORAL_CAPSULE | ORAL | 0 refills | Status: DC

## 2019-08-04 ENCOUNTER — Other Ambulatory Visit: Payer: Self-pay | Admitting: Oncology

## 2019-08-05 ENCOUNTER — Telehealth: Payer: Self-pay | Admitting: Oncology

## 2019-08-05 NOTE — Telephone Encounter (Signed)
Rescheduled per providers pal, 07/06 appointment has been moved to 07/14. Patient has been called and voicemail was left.

## 2019-08-09 ENCOUNTER — Inpatient Hospital Stay: Payer: Medicare Other

## 2019-08-09 ENCOUNTER — Inpatient Hospital Stay: Payer: Medicare Other | Admitting: Oncology

## 2019-08-09 ENCOUNTER — Ambulatory Visit: Payer: Medicare Other

## 2019-08-17 ENCOUNTER — Inpatient Hospital Stay: Payer: Medicare Other | Attending: Oncology

## 2019-08-17 ENCOUNTER — Inpatient Hospital Stay (HOSPITAL_BASED_OUTPATIENT_CLINIC_OR_DEPARTMENT_OTHER): Payer: Medicare Other | Admitting: Oncology

## 2019-08-17 ENCOUNTER — Inpatient Hospital Stay: Payer: Medicare Other

## 2019-08-17 ENCOUNTER — Other Ambulatory Visit: Payer: Self-pay

## 2019-08-17 VITALS — BP 123/76 | HR 62 | Temp 98.1°F | Resp 17 | Ht 72.0 in | Wt 203.9 lb

## 2019-08-17 DIAGNOSIS — C9000 Multiple myeloma not having achieved remission: Secondary | ICD-10-CM

## 2019-08-17 DIAGNOSIS — M549 Dorsalgia, unspecified: Secondary | ICD-10-CM | POA: Insufficient documentation

## 2019-08-17 DIAGNOSIS — G629 Polyneuropathy, unspecified: Secondary | ICD-10-CM | POA: Insufficient documentation

## 2019-08-17 DIAGNOSIS — G8929 Other chronic pain: Secondary | ICD-10-CM | POA: Insufficient documentation

## 2019-08-17 LAB — CMP (CANCER CENTER ONLY)
ALT: 20 U/L (ref 0–44)
AST: 15 U/L (ref 15–41)
Albumin: 3.6 g/dL (ref 3.5–5.0)
Alkaline Phosphatase: 25 U/L — ABNORMAL LOW (ref 38–126)
Anion gap: 9 (ref 5–15)
BUN: 13 mg/dL (ref 8–23)
CO2: 23 mmol/L (ref 22–32)
Calcium: 9.3 mg/dL (ref 8.9–10.3)
Chloride: 108 mmol/L (ref 98–111)
Creatinine: 0.8 mg/dL (ref 0.61–1.24)
GFR, Est AFR Am: >60 mL/min (ref >60)
GFR, Estimated: >60 mL/min (ref >60)
Glucose, Bld: 99 mg/dL (ref 70–99)
Potassium: 3.5 mmol/L (ref 3.5–5.1)
Sodium: 140 mmol/L (ref 135–145)
Total Bilirubin: 0.3 mg/dL (ref 0.3–1.2)
Total Protein: 6.9 g/dL (ref 6.5–8.1)

## 2019-08-17 LAB — CBC WITH DIFFERENTIAL (CANCER CENTER ONLY)
Abs Immature Granulocytes: 0.03 10*3/uL (ref 0.00–0.07)
Basophils Absolute: 0.1 10*3/uL (ref 0.0–0.1)
Basophils Relative: 1 %
Eosinophils Absolute: 1.1 10*3/uL — ABNORMAL HIGH (ref 0.0–0.5)
Eosinophils Relative: 18 %
HCT: 36.6 % — ABNORMAL LOW (ref 39.0–52.0)
Hemoglobin: 12.1 g/dL — ABNORMAL LOW (ref 13.0–17.0)
Immature Granulocytes: 1 %
Lymphocytes Relative: 17 %
Lymphs Abs: 1 10*3/uL (ref 0.7–4.0)
MCH: 32.2 pg (ref 26.0–34.0)
MCHC: 33.1 g/dL (ref 30.0–36.0)
MCV: 97.3 fL (ref 80.0–100.0)
Monocytes Absolute: 0.9 10*3/uL (ref 0.1–1.0)
Monocytes Relative: 16 %
Neutro Abs: 2.7 10*3/uL (ref 1.7–7.7)
Neutrophils Relative %: 47 %
Platelet Count: 301 10*3/uL (ref 150–400)
RBC: 3.76 MIL/uL — ABNORMAL LOW (ref 4.22–5.81)
RDW: 13.9 % (ref 11.5–15.5)
WBC Count: 5.8 10*3/uL (ref 4.0–10.5)
nRBC: 0 % (ref 0.0–0.2)

## 2019-08-17 MED ORDER — ZOLEDRONIC ACID 4 MG/100ML IV SOLN
INTRAVENOUS | Status: AC
  Filled 2019-08-17: qty 100

## 2019-08-17 MED ORDER — ZOLEDRONIC ACID 4 MG/100ML IV SOLN
4.0000 mg | Freq: Once | INTRAVENOUS | Status: AC
  Administered 2019-08-17: 4 mg via INTRAVENOUS

## 2019-08-17 MED ORDER — SODIUM CHLORIDE 0.9 % IV SOLN
INTRAVENOUS | Status: DC
  Filled 2019-08-17: qty 250

## 2019-08-17 NOTE — Patient Instructions (Signed)
Zoledronic Acid injection (Hypercalcemia, Oncology) What is this medicine? ZOLEDRONIC ACID (ZOE le dron ik AS id) lowers the amount of calcium loss from bone. It is used to treat too much calcium in your blood from cancer. It is also used to prevent complications of cancer that has spread to the bone. This medicine may be used for other purposes; ask your health care provider or pharmacist if you have questions. COMMON BRAND NAME(S): Zometa What should I tell my health care provider before I take this medicine? They need to know if you have any of these conditions:  aspirin-sensitive asthma  cancer, especially if you are receiving medicines used to treat cancer  dental disease or wear dentures  infection  kidney disease  receiving corticosteroids like dexamethasone or prednisone  an unusual or allergic reaction to zoledronic acid, other medicines, foods, dyes, or preservatives  pregnant or trying to get pregnant  breast-feeding How should I use this medicine? This medicine is for infusion into a vein. It is given by a health care professional in a hospital or clinic setting. Talk to your pediatrician regarding the use of this medicine in children. Special care may be needed. Overdosage: If you think you have taken too much of this medicine contact a poison control center or emergency room at once. NOTE: This medicine is only for you. Do not share this medicine with others. What if I miss a dose? It is important not to miss your dose. Call your doctor or health care professional if you are unable to keep an appointment. What may interact with this medicine?  certain antibiotics given by injection  NSAIDs, medicines for pain and inflammation, like ibuprofen or naproxen  some diuretics like bumetanide, furosemide  teriparatide  thalidomide This list may not describe all possible interactions. Give your health care provider a list of all the medicines, herbs, non-prescription  drugs, or dietary supplements you use. Also tell them if you smoke, drink alcohol, or use illegal drugs. Some items may interact with your medicine. What should I watch for while using this medicine? Visit your doctor or health care professional for regular checkups. It may be some time before you see the benefit from this medicine. Do not stop taking your medicine unless your doctor tells you to. Your doctor may order blood tests or other tests to see how you are doing. Women should inform their doctor if they wish to become pregnant or think they might be pregnant. There is a potential for serious side effects to an unborn child. Talk to your health care professional or pharmacist for more information. You should make sure that you get enough calcium and vitamin D while you are taking this medicine. Discuss the foods you eat and the vitamins you take with your health care professional. Some people who take this medicine have severe bone, joint, and/or muscle pain. This medicine may also increase your risk for jaw problems or a broken thigh bone. Tell your doctor right away if you have severe pain in your jaw, bones, joints, or muscles. Tell your doctor if you have any pain that does not go away or that gets worse. Tell your dentist and dental surgeon that you are taking this medicine. You should not have major dental surgery while on this medicine. See your dentist to have a dental exam and fix any dental problems before starting this medicine. Take good care of your teeth while on this medicine. Make sure you see your dentist for regular follow-up   appointments. What side effects may I notice from receiving this medicine? Side effects that you should report to your doctor or health care professional as soon as possible:  allergic reactions like skin rash, itching or hives, swelling of the face, lips, or tongue  anxiety, confusion, or depression  breathing problems  changes in vision  eye  pain  feeling faint or lightheaded, falls  jaw pain, especially after dental work  mouth sores  muscle cramps, stiffness, or weakness  redness, blistering, peeling or loosening of the skin, including inside the mouth  trouble passing urine or change in the amount of urine Side effects that usually do not require medical attention (report to your doctor or health care professional if they continue or are bothersome):  bone, joint, or muscle pain  constipation  diarrhea  fever  hair loss  irritation at site where injected  loss of appetite  nausea, vomiting  stomach upset  trouble sleeping  trouble swallowing  weak or tired This list may not describe all possible side effects. Call your doctor for medical advice about side effects. You may report side effects to FDA at 1-800-FDA-1088. Where should I keep my medicine? This drug is given in a hospital or clinic and will not be stored at home. NOTE: This sheet is a summary. It may not cover all possible information. If you have questions about this medicine, talk to your doctor, pharmacist, or health care provider.  2020 Elsevier/Gold Standard (2013-06-18 14:19:39)  

## 2019-08-17 NOTE — Progress Notes (Signed)
Hematology and Oncology Follow Up Visit  Alexander Saunders [id number] Jan 03, 1951 69 y.o. 08/17/2019 8:54 AM Alexander Saunders, Alexander Saunders MDOsei-Bonsu, Alexander Beard, MD   Principle Diagnosis: 69 year old man with kappa light chain multiple myeloma presented with epidural mass and bone disease diagnosed in 2018.    Prior Therapy:  He is status post surgical decompression of an epidural mass on 09/04/2016 and the pathology showed a plasma cell neoplasm.  He is S/P adjuvant radiation therapy to the thoracic spine to be completed on 10/14/2016.  Velcade 1.5 mg/m weekly with dexamethasone 20 mg and Cytoxan 600 mg po. Therapy started on 10/24/2016.   High-dose chemotherapy followed by stem cell transplant: Melphalan 200 mg/m2 completed on 06/17/17 followed by autologous stem cell rescue 06/18/17.  He achieved complete response.      Current therapy:   Zometa 4 mg every 3 months started in February 2020.  Revlimid 10 mg daily for 21 days out of a 28-day started in February 2020.     Interim History:  Alexander Saunders presents today for a repeat evaluation.  Since the last visit, he reports no major changes in his health.  He continues to exercise regularly have lost weight intentionally including dietary changes as well as walking daily.  He denies any bone pain or pathological fractures.  He denies any recent hospitalization or illnesses.  He has tolerated Revlimid without any new complaints.  He denies any nausea, fatigue but does report constipation that is manageable at this time.      Medications: Reviewed without changes. Current Outpatient Medications  Medication Sig Dispense Refill  . aspirin EC 81 MG tablet Take 81 mg by mouth daily.    . EARWAX REMOVAL 6.5 % OTIC solution     . gabapentin (NEURONTIN) 300 MG capsule TAKE 3 CAPSULES EVERY MORNING AND 3 CAPSULES EVERY NIGHT AT BEDTIME 180 capsule 3  . lenalidomide (REVLIMID) 10 MG capsule TAKE 1 CAPSULE DAILY FOR 21 DAYS, THEN 7 DAYS OFF, REPEAT  EVERY 28 DAYS. 21 capsule 0  . losartan (COZAAR) 25 MG tablet Take 25 mg by mouth daily.    . Multiple Vitamin (MULTIVITAMIN) capsule Take 1 capsule by mouth daily.    [name] OVER THE COUNTER MEDICATION CBD tincture 1000 mg daily    . polyethylene glycol powder (GLYCOLAX/MIRALAX) 17 GM/SCOOP powder Take by mouth.    [name] tiZANidine (ZANAFLEX) 2 MG tablet Take by mouth.    [name] tiZANidine (ZANAFLEX) 2 MG tablet TAKE 1 TABLET(2 MG) BY MOUTH AT BEDTIME AS NEEDED FOR MUSCLE SPASMS 30 tablet 5  . VIAGRA 100 MG tablet      No current facility-administered medications for this visit.     Allergies: No Known Allergies    Physical Exam:     Blood pressure 123/76, pulse 62, temperature 98.1 F (36.7 C), temperature source Temporal, resp. rate 17, height 6' (1.829 m), weight 203 lb 14.4 oz (92.5 kg), SpO2 100 %.     ECOG: 1   General appearance: Comfortable appearing without any discomfort Head: Normocephalic without any trauma Oropharynx: Mucous membranes are moist and pink without any thrush or ulcers. Eyes: Pupils are equal and round reactive to light. Lymph nodes: No cervical, supraclavicular, inguinal or axillary lymphadenopathy.   Heart:regular rate and rhythm.  S1 and S2 without leg edema. Lung: Clear without any rhonchi or wheezes.  No dullness to percussion. Abdomin: Soft, nontender, nondistended with good bowel sounds.  No hepatosplenomegaly. Musculoskeletal: No joint deformity or effusion.  Full range of motion noted. Neurological:  No deficits noted on motor, sensory and deep tendon reflex exam. Skin: No petechial rash or dryness.  Appeared moist.         Lab Results: Lab Results  Component Value Date   WBC 4.9 05/10/2019   HGB 13.2 05/10/2019   HCT 39.6 05/10/2019   MCV 96.4 05/10/2019   PLT 258 05/10/2019     Chemistry      Component Value Date/Time   NA 141 05/10/2019 1141   NA 137 02/06/2017 0828   K 3.9 05/10/2019 1141   K 4.0 02/06/2017 0828   CL 106  05/10/2019 1141   CO2 27 05/10/2019 1141   CO2 24 02/06/2017 0828   BUN 11 05/10/2019 1141   BUN 14.9 02/06/2017 0828   CREATININE 0.90 05/10/2019 1141   CREATININE 0.9 02/06/2017 0828      Component Value Date/Time   CALCIUM 9.5 05/10/2019 1141   CALCIUM 9.1 02/06/2017 0828   ALKPHOS 30 (L) 05/10/2019 1141   ALKPHOS 31 (L) 02/06/2017 0828   AST 18 05/10/2019 1141   AST 32 02/06/2017 0828   ALT 13 05/10/2019 1141   ALT 43 02/06/2017 0828   BILITOT 0.5 05/10/2019 1141   BILITOT 0.42 02/06/2017 0828       Results for Alexander Saunders (MRN [id number]) as of 08/17/2019 08:56  Ref. Range 03/11/2019 13:27 05/10/2019 11:41  Kappa free light chain Latest Ref Range: 3.3 - 19.4 mg/L 23.1 (H) 30.4 (H)  Lamda free light chains Latest Ref Range: 5.7 - 26.3 mg/L 21.4 25.6  Kappa, lamda lightchain ratio Latest Ref Range: 0.26 - 1.65  1.08 1.19      Impression and Plan:  69 year old man with:  1.  Kappa light chain multiple myeloma diagnosed in 2018.  He was found to have disease to the bone at that time.   He remains on maintenance Revlimid with excellent overall tolerance and overall response.  He continues to be in remission based on protein studies obtained in April 2021.  His kappa to lambda ratio continues to be normal with normal quantitative immunoglobulins and serum protein electrophoresis with immunofixation.  Risks and benefits of continuing Revlimid maintenance were reviewed.  He is tentatively to complete 2 years of therapy that will conclude in February 2022.  Different salvage therapy options will be introduced if he developed relapsed disease.  He is agreeable with this plan.  2.  Bone health: We will continue Zometa every 3 months to complete 2 years of therapy.  Long-term complication clinic osteonecrosis of the jaw and hypocalcemia reiterated.   3.  Neuropathy and chronic back pain: He remains on Neurontin with pain is manageable.  4.  Immunization: He has completed the  Covid vaccination as well as up-to-date on other immunizations.  5. Follow-up: He will return in 3 months for repeat follow-up.  30 minutes were spent on this visit.  The time was dedicated to updating his disease status, discussing treatment options and outlining future plan of care reviewed.   Zola Button, MD 7/14/20218:54 AM

## 2019-08-23 ENCOUNTER — Telehealth: Payer: Self-pay | Admitting: Oncology

## 2019-08-23 IMAGING — DX DG BONE SURVEY MET
10 series · 10 of 10 positions shown · non-contrast
Comparison: Previous imaging from [DATE] [facility] of this year.

CLINICAL DATA: Multiple myeloma.

EXAM:
METASTATIC BONE SURVEY

[skull lat]
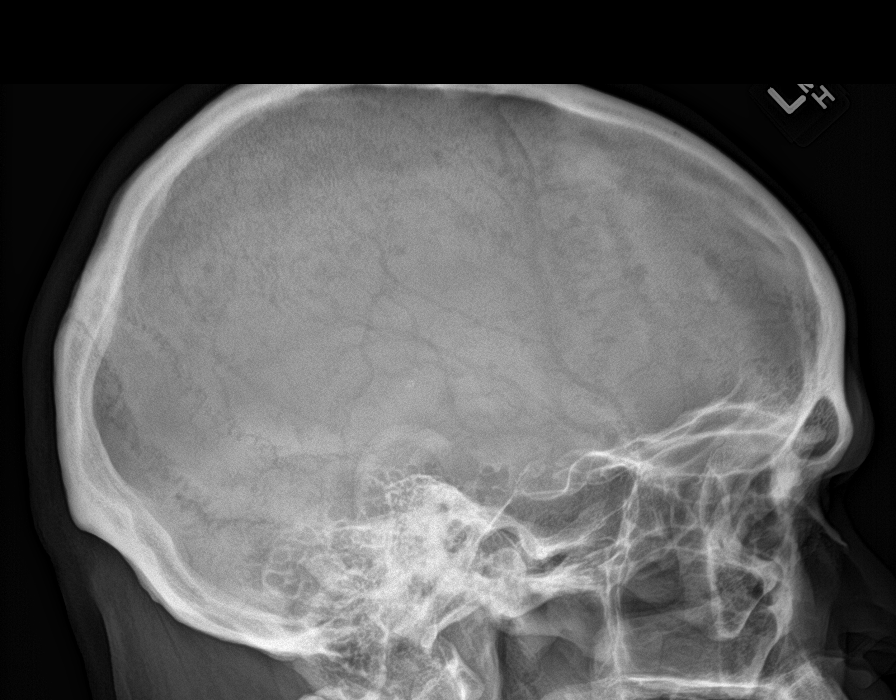

[shoulder ap (1 of 2)]
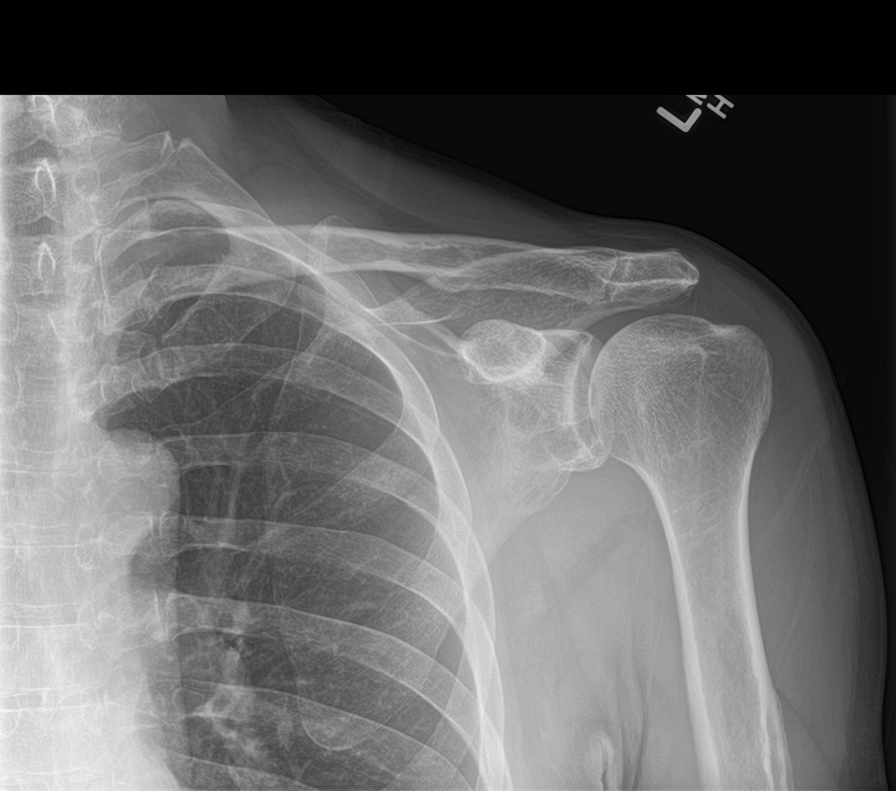

[shoulder ap (2 of 2)]
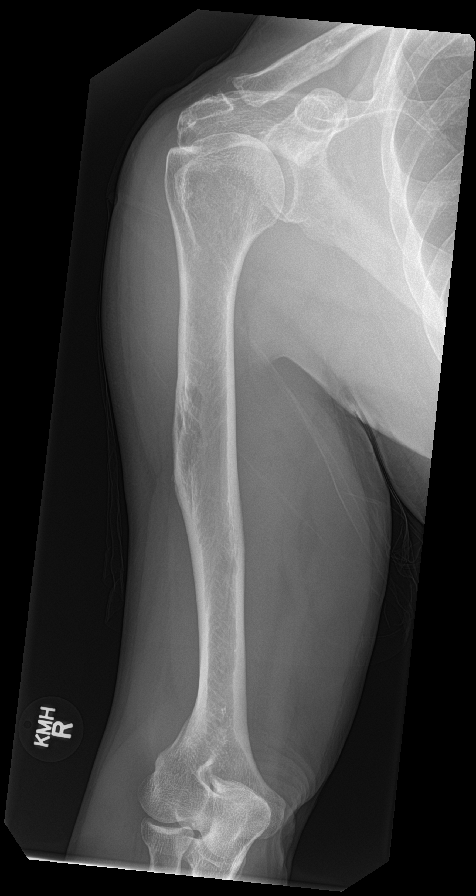

[humerus ap (1 of 2)]
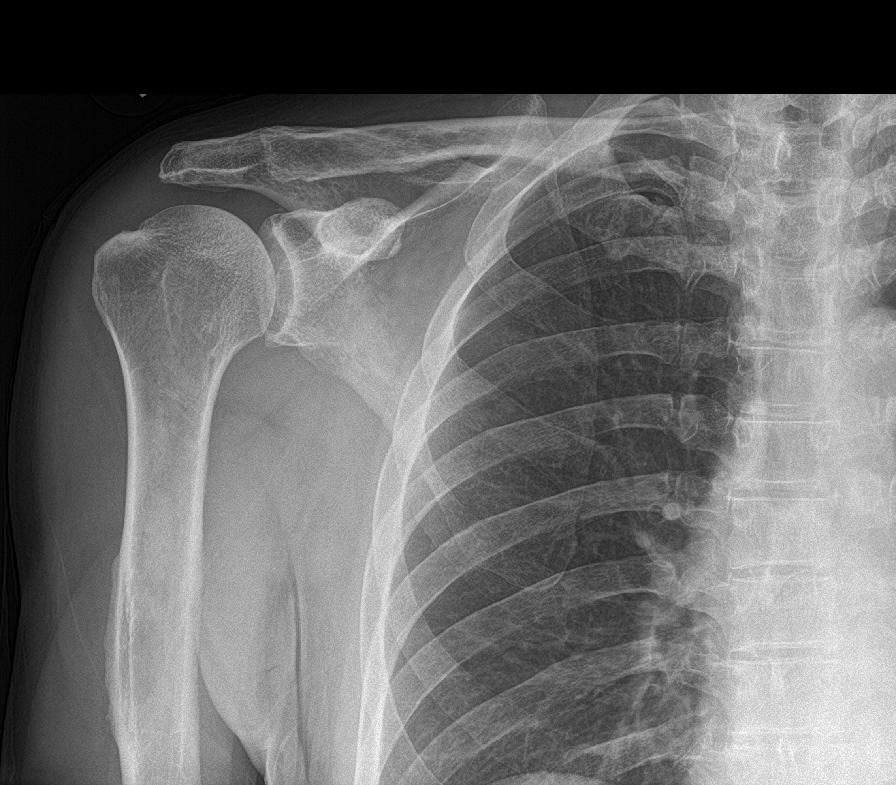

[humerus ap (2 of 2)]
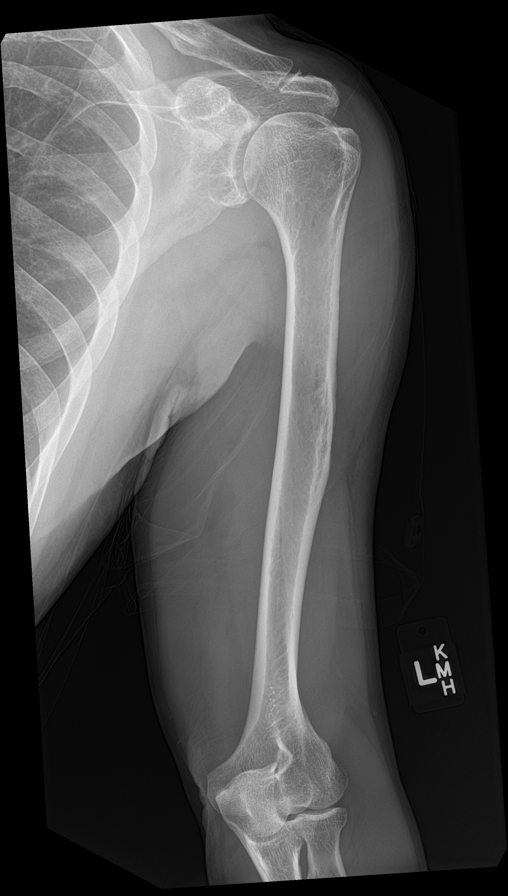

[forearm ap (1 of 2)]
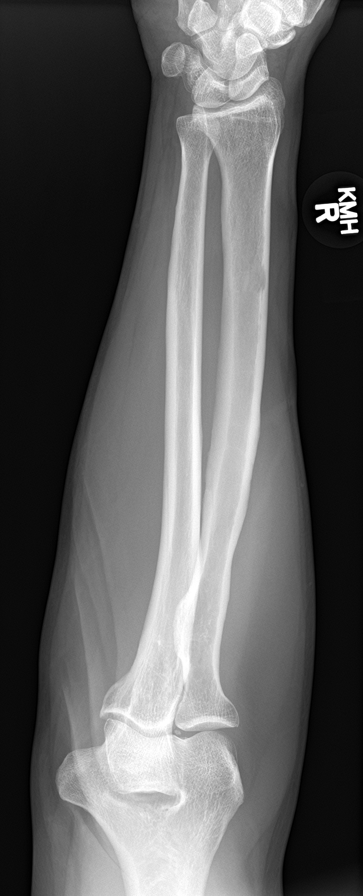

[forearm ap (2 of 2)]
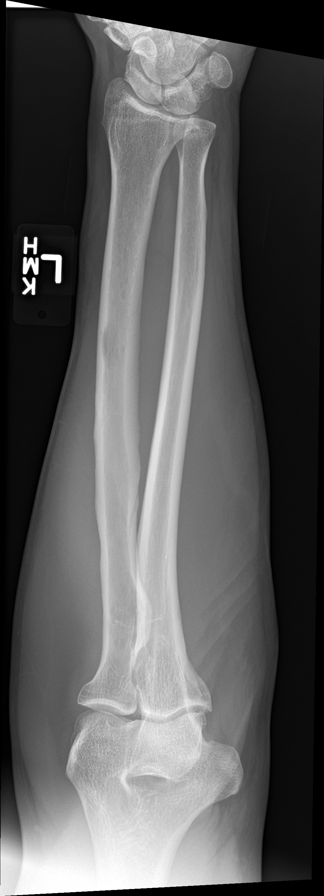

[c-spine ap]
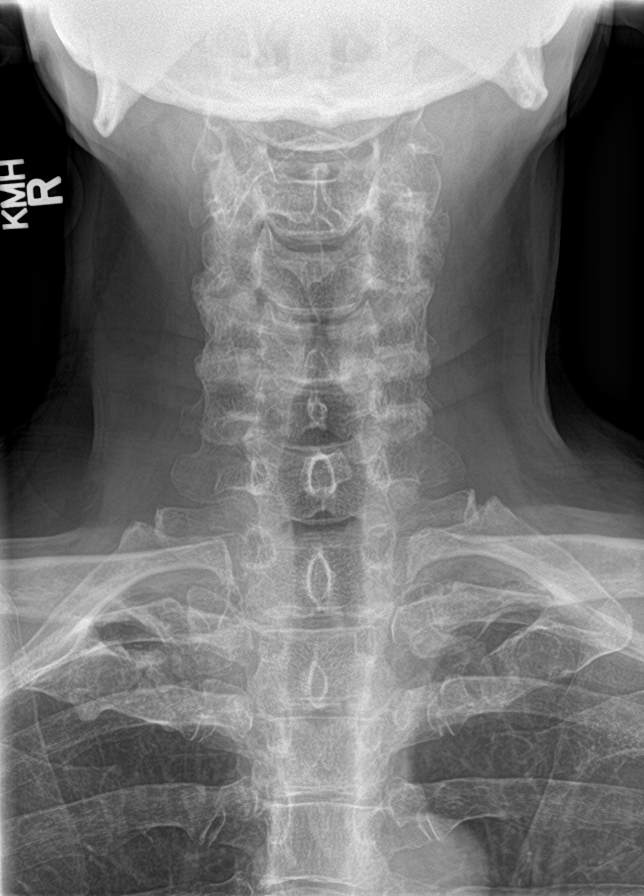

[c-spine lat]
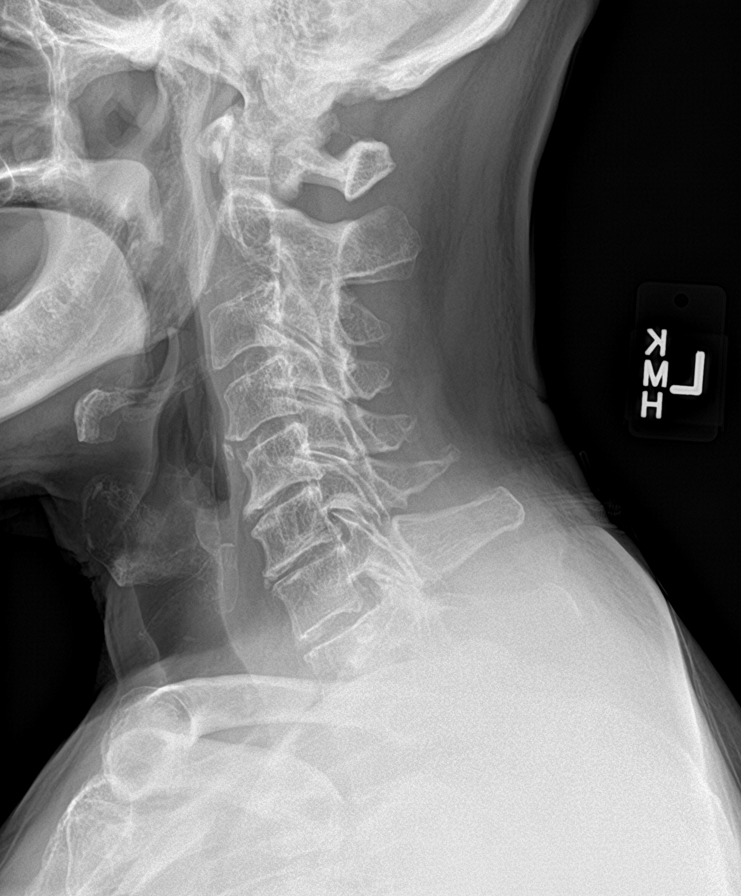

[t-spine ap]
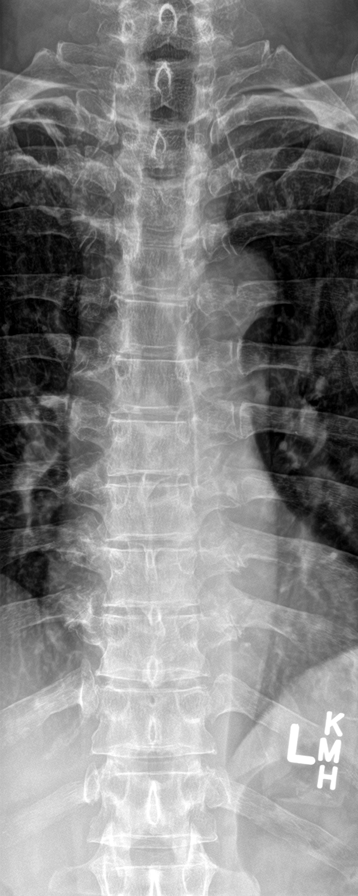

[10 of 10 positions shown; findings below may reference images not displayed]

FINDINGS: Lateral skull: Several scattered tiny lucencies which are
nonspecific. The largest overlying the frontal region measures 9 mm.
Myeloma involvement is not excluded.

Cervical spine two views: Lucent appearance of the C2 vertebral body
consistent with myeloma involvement. No evidence of fracture.
Posterior element fusion at C2-3.

Thoracic spine two views: No evidence of thoracic fracture. Poorly
seen pedicle on the left at T5 consistent with known disease in that
region.

Lumbar spine two views: No evidence of lumbar fracture or focal
lucency.

Left shoulder one view: No focal lucency.

Left humerus one view:  No focal lucency.

Left forearm one view:  No focal lucency.

Right shoulder one view:  No focal lucency.

Right humerus one view:  Indeterminate 1 cm lucency midshaft.

Right forearm:  Indeterminate 1 cm lucency distal radial diaphysis.

Chest one view: Heart and mediastinum are normal. Lungs are clear.
No effusions. No rib abnormality seen.

AP pelvis: Scattered small areas of lucency throughout the bones of
the pelvis. Larger regional area of osteopenia in the right superior
medial acetabular region consistent with myeloma involvement.
Probable asymmetric of involvement of the right sacrum.

Left humerus:  Negative

Right humerus:  Negative

Left tib-fib:  Negative

Right tib-fib: Negative
IMPRESSION: No evidence of fracture. Definite areas of lucency representing
myeloma affecting C2, T5, right acetabulum and right sacrum. Other
small areas of lucency affecting the calvarium and axial skeleton
consistent with small foci of involvement. These can serve as
baseline for future exams. Possible small appendicular skeletal
lesions, particularly in the right humerus and forearm. No lesion of
the appendicular skeleton that would seem to place the patient at
increased risk of long bone fracture.

## 2019-08-23 NOTE — Telephone Encounter (Signed)
Scheduled per 07/14 los, patient has been called and voicemail was left. 

## 2019-08-25 ENCOUNTER — Other Ambulatory Visit: Payer: Self-pay

## 2019-08-25 MED ORDER — LENALIDOMIDE 10 MG PO CAPS: ORAL_CAPSULE | ORAL | 0 refills | Status: DC

## 2019-09-20 ENCOUNTER — Other Ambulatory Visit: Payer: Self-pay | Admitting: Oncology

## 2019-09-21 ENCOUNTER — Other Ambulatory Visit: Payer: Self-pay

## 2019-09-21 MED ORDER — LENALIDOMIDE 10 MG PO CAPS: ORAL_CAPSULE | ORAL | 0 refills | Status: DC

## 2019-10-11 ENCOUNTER — Telehealth: Payer: Self-pay

## 2019-10-11 NOTE — Telephone Encounter (Signed)
Patient called asking about getting COVID antibodies test. Advised patient that we are not checking  COVID antibodies, he should reach out to his PCP. Patient asked about getting boaster shot. Advised patient that we are only giving out the [employer] shots. Patient verbalized understanding.

## 2019-10-22 ENCOUNTER — Other Ambulatory Visit: Payer: Self-pay | Admitting: Oncology

## 2019-10-24 ENCOUNTER — Other Ambulatory Visit: Payer: Self-pay | Admitting: *Deleted

## 2019-10-24 MED ORDER — LENALIDOMIDE 10 MG PO CAPS: ORAL_CAPSULE | ORAL | 0 refills | Status: DC

## 2019-11-16 ENCOUNTER — Inpatient Hospital Stay (HOSPITAL_BASED_OUTPATIENT_CLINIC_OR_DEPARTMENT_OTHER): Payer: Medicare Other | Admitting: Oncology

## 2019-11-16 ENCOUNTER — Ambulatory Visit: Payer: Medicare Other | Admitting: Hematology

## 2019-11-16 ENCOUNTER — Ambulatory Visit: Payer: Medicare Other

## 2019-11-16 ENCOUNTER — Inpatient Hospital Stay: Payer: Medicare Other

## 2019-11-16 ENCOUNTER — Inpatient Hospital Stay: Payer: Medicare Other | Attending: Oncology

## 2019-11-16 ENCOUNTER — Other Ambulatory Visit: Payer: Medicare Other

## 2019-11-16 ENCOUNTER — Other Ambulatory Visit: Payer: Self-pay

## 2019-11-16 VITALS — BP 118/67 | HR 58 | Temp 97.7°F | Resp 18 | Ht 72.0 in | Wt 205.9 lb

## 2019-11-16 DIAGNOSIS — G629 Polyneuropathy, unspecified: Secondary | ICD-10-CM | POA: Insufficient documentation

## 2019-11-16 DIAGNOSIS — C9000 Multiple myeloma not having achieved remission: Secondary | ICD-10-CM | POA: Insufficient documentation

## 2019-11-16 LAB — CMP (CANCER CENTER ONLY)
ALT: 29 U/L (ref 0–44)
AST: 43 U/L — ABNORMAL HIGH (ref 15–41)
Albumin: 3.4 g/dL — ABNORMAL LOW (ref 3.5–5.0)
Alkaline Phosphatase: 29 U/L — ABNORMAL LOW (ref 38–126)
Anion gap: 6 (ref 5–15)
BUN: 7 mg/dL — ABNORMAL LOW (ref 8–23)
CO2: 28 mmol/L (ref 22–32)
Calcium: 9.3 mg/dL (ref 8.9–10.3)
Chloride: 108 mmol/L (ref 98–111)
Creatinine: 0.88 mg/dL (ref 0.61–1.24)
GFR, Estimated: >60 mL/min (ref >60)
Glucose, Bld: 75 mg/dL (ref 70–99)
Potassium: 3.6 mmol/L (ref 3.5–5.1)
Sodium: 142 mmol/L (ref 135–145)
Total Bilirubin: 0.5 mg/dL (ref 0.3–1.2)
Total Protein: 6.7 g/dL (ref 6.5–8.1)

## 2019-11-16 LAB — CBC WITH DIFFERENTIAL (CANCER CENTER ONLY)
Abs Immature Granulocytes: 0.04 10*3/uL (ref 0.00–0.07)
Basophils Absolute: 0.1 10*3/uL (ref 0.0–0.1)
Basophils Relative: 1 %
Eosinophils Absolute: 0.8 10*3/uL — ABNORMAL HIGH (ref 0.0–0.5)
Eosinophils Relative: 13 %
HCT: 37.3 % — ABNORMAL LOW (ref 39.0–52.0)
Hemoglobin: 12.6 g/dL — ABNORMAL LOW (ref 13.0–17.0)
Immature Granulocytes: 1 %
Lymphocytes Relative: 18 %
Lymphs Abs: 1 10*3/uL (ref 0.7–4.0)
MCH: 32.2 pg (ref 26.0–34.0)
MCHC: 33.8 g/dL (ref 30.0–36.0)
MCV: 95.4 fL (ref 80.0–100.0)
Monocytes Absolute: 0.9 10*3/uL (ref 0.1–1.0)
Monocytes Relative: 16 %
Neutro Abs: 3 10*3/uL (ref 1.7–7.7)
Neutrophils Relative %: 51 %
Platelet Count: 272 10*3/uL (ref 150–400)
RBC: 3.91 MIL/uL — ABNORMAL LOW (ref 4.22–5.81)
RDW: 14.2 % (ref 11.5–15.5)
WBC Count: 5.9 10*3/uL (ref 4.0–10.5)
nRBC: 0 % (ref 0.0–0.2)

## 2019-11-16 MED ORDER — ZOLEDRONIC ACID 4 MG/100ML IV SOLN
4.0000 mg | Freq: Once | INTRAVENOUS | Status: AC
  Administered 2019-11-16: 4 mg via INTRAVENOUS

## 2019-11-16 MED ORDER — ZOLEDRONIC ACID 4 MG/100ML IV SOLN
INTRAVENOUS | Status: AC
  Filled 2019-11-16: qty 100

## 2019-11-16 MED ORDER — SODIUM CHLORIDE 0.9 % IV SOLN
Freq: Once | INTRAVENOUS | Status: AC
  Filled 2019-11-16: qty 250

## 2019-11-16 NOTE — Progress Notes (Signed)
Hematology and Oncology Follow Up Visit  Alexander Saunders [id number] Sep 27, 1950 69 y.o. 11/16/2019 8:36 AM Saunders, Alexander Saunders MDOsei-Bonsu, Alexander Beard, MD   Principle Diagnosis: 69 year old man with multiple myeloma diagnosed in 2018.  He was found to have kappa light chain with bone involvement and epidural compression.  Prior Therapy:  He is status post surgical decompression of an epidural mass on 09/04/2016 and the pathology showed a plasma cell neoplasm.  He is S/P adjuvant radiation therapy to the thoracic spine to be completed on 10/14/2016.  Velcade 1.5 mg/m weekly with dexamethasone 20 mg and Cytoxan 600 mg po. Therapy started on 10/24/2016.   High-dose chemotherapy followed by stem cell transplant: Melphalan 200 mg/m2 completed on 06/17/17 followed by autologous stem cell rescue 06/18/17.  He achieved complete response.      Current therapy:   Zometa 4 mg every 3 months started in February 2020.  Revlimid 10 mg daily for 21 days out of a 28-day started in February 2020.     Interim History:  Alexander Saunders is here for a follow-up visit.  Since the last visit, he reports no major changes in his health.  He continues to have lower extremity neuropathy that has been chronic in nature and not dramatically changed.  Is currently on 900 mg Neurontin daily.  Despite that, he remains active continues to attempt activities of daily living including working out regularly and attending school.  Performance status quality of life remained excellent.  He denies any complications related to Revlimid.      Medications: Unchanged on review. Current Outpatient Medications  Medication Sig Dispense Refill  . aspirin EC 81 MG tablet Take 81 mg by mouth daily.    . EARWAX REMOVAL 6.5 % OTIC solution     . gabapentin (NEURONTIN) 300 MG capsule TAKE 3 CAPSULES EVERY MORNING AND 3 CAPSULES EVERY NIGHT AT BEDTIME 180 capsule 3  . lenalidomide (REVLIMID) 10 MG capsule TAKE 1 CAPSULE DAILY FOR 21  DAYS, THEN 7 DAYS OFF, REPEAT EVERY 28 DAYS. 21 capsule 0  . losartan (COZAAR) 25 MG tablet Take 25 mg by mouth daily.    . Multiple Vitamin (MULTIVITAMIN) capsule Take 1 capsule by mouth daily.    [name] OVER THE COUNTER MEDICATION CBD tincture 1000 mg daily    . polyethylene glycol powder (GLYCOLAX/MIRALAX) 17 GM/SCOOP powder Take by mouth.    [name] tiZANidine (ZANAFLEX) 2 MG tablet Take by mouth.    [name] tiZANidine (ZANAFLEX) 2 MG tablet TAKE 1 TABLET(2 MG) BY MOUTH AT BEDTIME AS NEEDED FOR MUSCLE SPASMS 30 tablet 5  . VIAGRA 100 MG tablet      No current facility-administered medications for this visit.     Allergies: No Known Allergies    Physical Exam:    Blood pressure 118/67, pulse (!) 58, temperature 97.7 F (36.5 C), temperature source Tympanic, resp. rate 18, height 6' (1.829 m), weight 205 lb 14.4 oz (93.4 kg), SpO2 100 %.      ECOG: 1     General appearance: Alert, awake without any distress. Head: Atraumatic without abnormalities Oropharynx: Without any thrush or ulcers. Eyes: No scleral icterus. Lymph nodes: No lymphadenopathy noted in the cervical, supraclavicular, or axillary nodes Heart:regular rate and rhythm, without any murmurs or gallops.   Lung: Clear to auscultation without any rhonchi, wheezes or dullness to percussion. Abdomin: Soft, nontender without any shifting dullness or ascites. Musculoskeletal: No clubbing or cyanosis. Neurological: No motor or sensory deficits. Skin: No rashes or lesions.  Lab Results: Lab Results  Component Value Date   WBC 5.8 08/17/2019   HGB 12.1 (L) 08/17/2019   HCT 36.6 (L) 08/17/2019   MCV 97.3 08/17/2019   PLT 301 08/17/2019     Chemistry      Component Value Date/Time   NA 140 08/17/2019 0843   NA 137 02/06/2017 0828   K 3.5 08/17/2019 0843   K 4.0 02/06/2017 0828   CL 108 08/17/2019 0843   CO2 23 08/17/2019 0843   CO2 24 02/06/2017 0828   BUN 13 08/17/2019 0843   BUN 14.9 02/06/2017 0828    CREATININE 0.80 08/17/2019 0843   CREATININE 0.9 02/06/2017 0828      Component Value Date/Time   CALCIUM 9.3 08/17/2019 0843   CALCIUM 9.1 02/06/2017 0828   ALKPHOS 25 (L) 08/17/2019 0843   ALKPHOS 31 (L) 02/06/2017 0828   AST 15 08/17/2019 0843   AST 32 02/06/2017 0828   ALT 20 08/17/2019 0843   ALT 43 02/06/2017 0828   BILITOT 0.3 08/17/2019 0843   BILITOT 0.42 02/06/2017 0828       Results for Alexander Saunders (MRN [id number]) as of 11/16/2019 08:34  Ref. Range 05/10/2019 11:41  Gamma Glob SerPl Elph-Mcnc Latest Ref Range: 0.4 - 1.8 g/dL 1.4  M Protein SerPl Elph-Mcnc Latest Ref Range: Not Observed g/dL Not Observed  IFE 1 Unknown Comment  Globulin, Total Latest Ref Range: 2.2 - 3.9 g/dL 3.2  B-Globulin SerPl Elph-Mcnc Latest Ref Range: 0.7 - 1.3 g/dL 1.0  IgG (Immunoglobin G), Serum Latest Ref Range: 603 - 1,613 mg/dL 1,410  IgM (Immunoglobulin M), Srm Latest Ref Range: 20 - 172 mg/dL 35  IgA Latest Ref Range: 61 - 437 mg/dL 163    Results for Alexander Saunders (MRN [id number]) as of 11/16/2019 08:34  Ref. Range 05/10/2019 11:41  Kappa free light chain Latest Ref Range: 3.3 - 19.4 mg/L 30.4 (H)  Lamda free light chains Latest Ref Range: 5.7 - 26.3 mg/L 25.6  Kappa, lamda light chain ratio Latest Ref Range: 0.26 - 1.65  1.19     Impression and Plan:  69 year old man with:  1.  Multiple myeloma presented with Kappa light chain and epidural compression tumor diagnosed in 2018.  Natural course of his disease was reviewed at this time.  He continues to be on Revlimid maintenance after achieving a complete response with the above-mentioned therapy.  Risks and benefits of continuing Revlimid were reviewed today.  Potential complications that include cytopenias, dermatological toxicity and changes in bowel habits were reviewed.  Plan is to complete 2 years of therapy which includes in February 2022.  Protein studies obtained in April 2021 showed continued complete response with  normalization of protein studies.  He is agreeable with this plan.  2.  Bone health: He is currently on Zometa which she will receive every 3 months to complete 2 years in February 2022.  Complication clinic osteonecrosis of the jaw and hypocalcemia were reiterated.  Is agreeable to proceed.   3.  Neuropathy and chronic back pain: Related to his previous tumor and surgery.  He is currently on Neurontin.   4.  Immunization: He is up-to-date on immunization occluding Covid vaccination.  I recommended obtaining Covid booster as well as flu vaccination.  5. Follow-up: In 3 months for repeat follow-up.  30 minutes were dedicated to this encounter.  The time was spent on reviewing laboratory data, discussing disease status update complications of therapy and future plan of care  review.   Zola Button, MD 10/13/20218:36 AM

## 2019-11-16 NOTE — Patient Instructions (Signed)
Zoledronic Acid injection (Hypercalcemia, Oncology) What is this medicine? ZOLEDRONIC ACID (ZOE le dron ik AS id) lowers the amount of calcium loss from bone. It is used to treat too much calcium in your blood from cancer. It is also used to prevent complications of cancer that has spread to the bone. This medicine may be used for other purposes; ask your health care provider or pharmacist if you have questions. COMMON BRAND NAME(S): Zometa What should I tell my health care provider before I take this medicine? They need to know if you have any of these conditions:  aspirin-sensitive asthma  cancer, especially if you are receiving medicines used to treat cancer  dental disease or wear dentures  infection  kidney disease  receiving corticosteroids like dexamethasone or prednisone  an unusual or allergic reaction to zoledronic acid, other medicines, foods, dyes, or preservatives  pregnant or trying to get pregnant  breast-feeding How should I use this medicine? This medicine is for infusion into a vein. It is given by a health care professional in a hospital or clinic setting. Talk to your pediatrician regarding the use of this medicine in children. Special care may be needed. Overdosage: If you think you have taken too much of this medicine contact a poison control center or emergency room at once. NOTE: This medicine is only for you. Do not share this medicine with others. What if I miss a dose? It is important not to miss your dose. Call your doctor or health care professional if you are unable to keep an appointment. What may interact with this medicine?  certain antibiotics given by injection  NSAIDs, medicines for pain and inflammation, like ibuprofen or naproxen  some diuretics like bumetanide, furosemide  teriparatide  thalidomide This list may not describe all possible interactions. Give your health care provider a list of all the medicines, herbs, non-prescription  drugs, or dietary supplements you use. Also tell them if you smoke, drink alcohol, or use illegal drugs. Some items may interact with your medicine. What should I watch for while using this medicine? Visit your doctor or health care professional for regular checkups. It may be some time before you see the benefit from this medicine. Do not stop taking your medicine unless your doctor tells you to. Your doctor may order blood tests or other tests to see how you are doing. Women should inform their doctor if they wish to become pregnant or think they might be pregnant. There is a potential for serious side effects to an unborn child. Talk to your health care professional or pharmacist for more information. You should make sure that you get enough calcium and vitamin D while you are taking this medicine. Discuss the foods you eat and the vitamins you take with your health care professional. Some people who take this medicine have severe bone, joint, and/or muscle pain. This medicine may also increase your risk for jaw problems or a broken thigh bone. Tell your doctor right away if you have severe pain in your jaw, bones, joints, or muscles. Tell your doctor if you have any pain that does not go away or that gets worse. Tell your dentist and dental surgeon that you are taking this medicine. You should not have major dental surgery while on this medicine. See your dentist to have a dental exam and fix any dental problems before starting this medicine. Take good care of your teeth while on this medicine. Make sure you see your dentist for regular follow-up   appointments. What side effects may I notice from receiving this medicine? Side effects that you should report to your doctor or health care professional as soon as possible:  allergic reactions like skin rash, itching or hives, swelling of the face, lips, or tongue  anxiety, confusion, or depression  breathing problems  changes in vision  eye  pain  feeling faint or lightheaded, falls  jaw pain, especially after dental work  mouth sores  muscle cramps, stiffness, or weakness  redness, blistering, peeling or loosening of the skin, including inside the mouth  trouble passing urine or change in the amount of urine Side effects that usually do not require medical attention (report to your doctor or health care professional if they continue or are bothersome):  bone, joint, or muscle pain  constipation  diarrhea  fever  hair loss  irritation at site where injected  loss of appetite  nausea, vomiting  stomach upset  trouble sleeping  trouble swallowing  weak or tired This list may not describe all possible side effects. Call your doctor for medical advice about side effects. You may report side effects to FDA at 1-800-FDA-1088. Where should I keep my medicine? This drug is given in a hospital or clinic and will not be stored at home. NOTE: This sheet is a summary. It may not cover all possible information. If you have questions about this medicine, talk to your doctor, pharmacist, or health care provider.  2020 Elsevier/Gold Standard (2013-06-18 14:19:39)  

## 2019-11-17 LAB — KAPPA/LAMBDA LIGHT CHAINS
Kappa free light chain: 41.4 mg/L — ABNORMAL HIGH (ref 3.3–19.4)
Kappa, lambda light chain ratio: 1.7 — ABNORMAL HIGH (ref 0.26–1.65)
Lambda free light chains: 24.4 mg/L (ref 5.7–26.3)

## 2019-11-18 LAB — MULTIPLE MYELOMA PANEL, SERUM
Albumin SerPl Elph-Mcnc: 3.5 g/dL (ref 2.9–4.4)
Albumin/Glob SerPl: 1.3 (ref 0.7–1.7)
Alpha 1: 0.2 g/dL (ref 0.0–0.4)
Alpha2 Glob SerPl Elph-Mcnc: 0.6 g/dL (ref 0.4–1.0)
B-Globulin SerPl Elph-Mcnc: 0.8 g/dL (ref 0.7–1.3)
Gamma Glob SerPl Elph-Mcnc: 1.2 g/dL (ref 0.4–1.8)
Globulin, Total: 2.8 g/dL (ref 2.2–3.9)
IgA: 171 mg/dL (ref 61–437)
IgG (Immunoglobin G), Serum: 1260 mg/dL (ref 603–1613)
IgM (Immunoglobulin M), Srm: 36 mg/dL (ref 20–172)
Total Protein ELP: 6.3 g/dL (ref 6.0–8.5)

## 2019-11-22 ENCOUNTER — Encounter: Payer: Self-pay | Admitting: Oncology

## 2019-11-22 ENCOUNTER — Other Ambulatory Visit: Payer: Self-pay | Admitting: Oncology

## 2019-11-24 ENCOUNTER — Other Ambulatory Visit: Payer: Self-pay

## 2019-11-24 MED ORDER — LENALIDOMIDE 10 MG PO CAPS
ORAL_CAPSULE | ORAL | 0 refills | Status: DC
Start: 2019-11-24 — End: 2019-12-22

## 2019-12-20 ENCOUNTER — Other Ambulatory Visit: Payer: Self-pay | Admitting: Oncology

## 2019-12-22 ENCOUNTER — Other Ambulatory Visit: Payer: Self-pay | Admitting: Oncology

## 2019-12-27 ENCOUNTER — Telehealth: Payer: Self-pay

## 2019-12-27 NOTE — Telephone Encounter (Signed)
Oral Oncology Patient Advocate Encounter  Received notification from [employer] that prior authorization for Revlimid is required.  PA submitted on CoverMyMeds Key [id number] Status is pending  Oral Oncology Clinic will continue to follow.   [place] Patient [place] Phone [phone] Fax [phone] 12/27/2019 8:14 AM

## 2019-12-28 DIAGNOSIS — R059 Cough, unspecified: Secondary | ICD-10-CM | POA: Diagnosis not present

## 2019-12-28 DIAGNOSIS — Z20828 Contact with and (suspected) exposure to other viral communicable diseases: Secondary | ICD-10-CM | POA: Diagnosis not present

## 2019-12-28 DIAGNOSIS — R509 Fever, unspecified: Secondary | ICD-10-CM | POA: Diagnosis not present

## 2019-12-28 DIAGNOSIS — J069 Acute upper respiratory infection, unspecified: Secondary | ICD-10-CM | POA: Diagnosis not present

## 2020-01-04 NOTE — Telephone Encounter (Signed)
Oral Oncology Patient Advocate Encounter  Prior Authorization for Revlimid has been approved.    PA# [id number] Effective dates: 11/27/19 through 02/02/2098   Oral Oncology Clinic will continue to follow.    [place] Patient [place] Phone [phone] Fax [phone] 01/04/2020 12:58 PM

## 2020-01-16 DIAGNOSIS — R972 Elevated prostate specific antigen [PSA]: Secondary | ICD-10-CM | POA: Diagnosis not present

## 2020-01-16 DIAGNOSIS — G620 Drug-induced polyneuropathy: Secondary | ICD-10-CM | POA: Diagnosis not present

## 2020-01-16 DIAGNOSIS — D638 Anemia in other chronic diseases classified elsewhere: Secondary | ICD-10-CM | POA: Diagnosis not present

## 2020-01-16 DIAGNOSIS — Z0001 Encounter for general adult medical examination with abnormal findings: Secondary | ICD-10-CM | POA: Diagnosis not present

## 2020-01-16 DIAGNOSIS — Z23 Encounter for immunization: Secondary | ICD-10-CM | POA: Diagnosis not present

## 2020-01-16 DIAGNOSIS — I10 Essential (primary) hypertension: Secondary | ICD-10-CM | POA: Diagnosis not present

## 2020-01-16 DIAGNOSIS — Z8579 Personal history of other malignant neoplasms of lymphoid, hematopoietic and related tissues: Secondary | ICD-10-CM | POA: Diagnosis not present

## 2020-01-20 ENCOUNTER — Other Ambulatory Visit: Payer: Self-pay | Admitting: Oncology

## 2020-02-13 ENCOUNTER — Encounter: Payer: Self-pay | Admitting: Neurology

## 2020-02-13 ENCOUNTER — Other Ambulatory Visit: Payer: Self-pay

## 2020-02-13 ENCOUNTER — Ambulatory Visit (INDEPENDENT_AMBULATORY_CARE_PROVIDER_SITE_OTHER): Payer: Medicare Other | Admitting: Neurology

## 2020-02-13 VITALS — BP 117/75 | HR 82 | Ht 72.0 in | Wt 207.0 lb

## 2020-02-13 DIAGNOSIS — M545 Low back pain, unspecified: Secondary | ICD-10-CM

## 2020-02-13 DIAGNOSIS — T451X5A Adverse effect of antineoplastic and immunosuppressive drugs, initial encounter: Secondary | ICD-10-CM

## 2020-02-13 DIAGNOSIS — G620 Drug-induced polyneuropathy: Secondary | ICD-10-CM

## 2020-02-13 DIAGNOSIS — G8929 Other chronic pain: Secondary | ICD-10-CM

## 2020-02-13 DIAGNOSIS — G959 Disease of spinal cord, unspecified: Secondary | ICD-10-CM

## 2020-02-13 MED ORDER — CYCLOBENZAPRINE HCL 5 MG PO TABS: 5.0000 mg | ORAL_TABLET | Freq: Every evening | ORAL | 1 refills | Status: DC | PRN

## 2020-02-13 NOTE — Patient Instructions (Signed)
Continue gabapentin 600mg  twice daily  You may try flexeril 5mg  at bedtime for severe low back spasms  Return to clinic in 1 year

## 2020-02-13 NOTE — Progress Notes (Signed)
Follow-up Visit   Date: 02/13/20   Alexander Saunders MRN: [id number] DOB: 1950/08/13   Interim History: Alexander Saunders is a 70 y.o. right-handed male with multiple myeloma manifesting with epidural mass s/p T4-6 laminectomy (2018) returning to the clinic for follow-up of bilateral leg paresthesias and low back pain.  The patient was accompanied to the clinic by self.  UPDATE 02/13/2020:  He is here for 1 year visit.  Around December 2021, he was increasing his strength training and noticed that his numbness was more pronounced in the feet, worse in the right leg and he had increased back spasms. He took rest for about a week which helped.  He remains on gabapentin 600mg  twice daily.  He has not suffered any falls. Otherwise, he has been doing well. No progression of myeloma.    Medications:  Current Outpatient Medications on File Prior to Visit  Medication Sig Dispense Refill  . aspirin EC 81 MG tablet Take 81 mg by mouth daily.    . diphenhydramine-acetaminophen (TYLENOL PM EXTRA STRENGTH) 25-500 MG TABS tablet Take 1 tablet by mouth at bedtime as needed.    . gabapentin (NEURONTIN) 300 MG capsule TAKE 3 CAPSULES EVERY MORNING AND 3 CAPSULES EVERY NIGHT AT BEDTIME 180 capsule 3  . lenalidomide (REVLIMID) 10 MG capsule TAKE 1 CAPSULE ONCE DAILY FOR 21 DAYS, THEN 7 DAYS OFF. REPEAT EVERY 28 DAYS. 21 capsule 0  . losartan (COZAAR) 25 MG tablet Take 25 mg by mouth daily.    . Multiple Vitamin (MULTIVITAMIN) capsule Take 1 capsule by mouth daily.    . polyethylene glycol powder (GLYCOLAX/MIRALAX) 17 GM/SCOOP powder Take by mouth as needed.    [name] VIAGRA 100 MG tablet     . EARWAX REMOVAL 6.5 % OTIC solution  (Patient not taking: Reported on 02/13/2020)     No current facility-administered medications on file prior to visit.    Allergies: No Known Allergies  Vital Signs:  BP 117/75   Pulse 82   Ht 6' (1.829 m)   Wt 207 lb (93.9 kg)   SpO2 98%   BMI 28.07 kg/m   Neurological  Exam: MENTAL STATUS including orientation to time, place, person, recent and remote memory, attention span and concentration, language, and fund of knowledge is normal.  Speech is not dysarthric.  CRANIAL NERVES: Pupils round and reactive bilaterally. Extraocular muscles intact.     MOTOR:  Motor strength is 5/5 in all extremities.  No atrophy, fasciculations or abnormal movements.  No pronator drift.  Tone is normal.    MSRs:  Reflexes are 2+/4 throughout, except 3+/4 bilateral patella jerks.  SENSORY: Reduced vibration and temperature over the right leg and foot as compared to the left.  Temperature intact throughout  COORDINATION/GAIT:  Normal finger-to- nose-finger. Gait narrow based and stable. Tandem gait intact   IMPRESSION/PLAN: 1.  Chemotherapy-induced neuropathy manifesting with bilateral feet paresthesias, stable  - Previously on gabapentin 600mg  TID, nortriptyline (ineffective)  - Continue gabapentin 600mg  BID  2.  Residual myelopathy secondary to epidural neoplasm cause bilateral leg paresthesias, stable.  3.  Chronic low back pain, mild exacerbation following strength training.  - D/C tizanidine (ineffective)  - start flexeril 5mg  at bedtime  - If symptoms get worse, pt will call to start physical therapy  Return to clinic in 1 year  Thank you for allowing me to participate in patient's care.  If I can answer any additional questions, I would be pleased to do so.  Sincerely,    Diania Co K. Posey Pronto, DO

## 2020-02-16 ENCOUNTER — Inpatient Hospital Stay: Payer: Medicare Other

## 2020-02-16 ENCOUNTER — Inpatient Hospital Stay: Payer: Medicare Other | Attending: Oncology

## 2020-02-16 ENCOUNTER — Other Ambulatory Visit: Payer: Self-pay

## 2020-02-16 ENCOUNTER — Inpatient Hospital Stay (HOSPITAL_BASED_OUTPATIENT_CLINIC_OR_DEPARTMENT_OTHER): Payer: Medicare Other | Admitting: Oncology

## 2020-02-16 VITALS — BP 122/75 | HR 71 | Temp 97.7°F | Resp 18 | Wt 205.5 lb

## 2020-02-16 DIAGNOSIS — M549 Dorsalgia, unspecified: Secondary | ICD-10-CM | POA: Insufficient documentation

## 2020-02-16 DIAGNOSIS — C9000 Multiple myeloma not having achieved remission: Secondary | ICD-10-CM | POA: Diagnosis not present

## 2020-02-16 DIAGNOSIS — G8929 Other chronic pain: Secondary | ICD-10-CM | POA: Diagnosis not present

## 2020-02-16 DIAGNOSIS — G629 Polyneuropathy, unspecified: Secondary | ICD-10-CM | POA: Insufficient documentation

## 2020-02-16 LAB — CBC WITH DIFFERENTIAL (CANCER CENTER ONLY)
Abs Immature Granulocytes: 0.04 10*3/uL (ref 0.00–0.07)
Basophils Absolute: 0.1 10*3/uL (ref 0.0–0.1)
Basophils Relative: 1 %
Eosinophils Absolute: 1.2 10*3/uL — ABNORMAL HIGH (ref 0.0–0.5)
Eosinophils Relative: 21 %
HCT: 37.1 % — ABNORMAL LOW (ref 39.0–52.0)
Hemoglobin: 12.6 g/dL — ABNORMAL LOW (ref 13.0–17.0)
Immature Granulocytes: 1 %
Lymphocytes Relative: 18 %
Lymphs Abs: 1.1 10*3/uL (ref 0.7–4.0)
MCH: 32.4 pg (ref 26.0–34.0)
MCHC: 34 g/dL (ref 30.0–36.0)
MCV: 95.4 fL (ref 80.0–100.0)
Monocytes Absolute: 0.9 10*3/uL (ref 0.1–1.0)
Monocytes Relative: 15 %
Neutro Abs: 2.5 10*3/uL (ref 1.7–7.7)
Neutrophils Relative %: 44 %
Platelet Count: 277 10*3/uL (ref 150–400)
RBC: 3.89 MIL/uL — ABNORMAL LOW (ref 4.22–5.81)
RDW: 13.9 % (ref 11.5–15.5)
WBC Count: 5.8 10*3/uL (ref 4.0–10.5)
nRBC: 0 % (ref 0.0–0.2)

## 2020-02-16 LAB — CMP (CANCER CENTER ONLY)
ALT: 16 U/L (ref 0–44)
AST: 15 U/L (ref 15–41)
Albumin: 3.6 g/dL (ref 3.5–5.0)
Alkaline Phosphatase: 35 U/L — ABNORMAL LOW (ref 38–126)
Anion gap: 7 (ref 5–15)
BUN: 9 mg/dL (ref 8–23)
CO2: 25 mmol/L (ref 22–32)
Calcium: 9.5 mg/dL (ref 8.9–10.3)
Chloride: 108 mmol/L (ref 98–111)
Creatinine: 0.97 mg/dL (ref 0.61–1.24)
GFR, Estimated: >60 mL/min (ref >60)
Glucose, Bld: 100 mg/dL — ABNORMAL HIGH (ref 70–99)
Potassium: 3.5 mmol/L (ref 3.5–5.1)
Sodium: 140 mmol/L (ref 135–145)
Total Bilirubin: 0.8 mg/dL (ref 0.3–1.2)
Total Protein: 7.5 g/dL (ref 6.5–8.1)

## 2020-02-16 MED ORDER — LENALIDOMIDE 10 MG PO CAPS: ORAL_CAPSULE | ORAL | 0 refills | Status: DC

## 2020-02-16 MED ORDER — SODIUM CHLORIDE 0.9 % IV SOLN
INTRAVENOUS | Status: DC
  Filled 2020-02-16: qty 250

## 2020-02-16 MED ORDER — ZOLEDRONIC ACID 4 MG/100ML IV SOLN
INTRAVENOUS | Status: AC
  Filled 2020-02-16: qty 100

## 2020-02-16 MED ORDER — ZOLEDRONIC ACID 4 MG/100ML IV SOLN
4.0000 mg | Freq: Once | INTRAVENOUS | Status: AC
  Administered 2020-02-16: 4 mg via INTRAVENOUS

## 2020-02-16 NOTE — Progress Notes (Signed)
Hematology and Oncology Follow Up Visit  Alexander Saunders [id number] Jul 31, 1950 70 y.o. 02/16/2020 8:48 AM Alexander Saunders, Alexander Saunders MDOsei-Bonsu, Alexander Beard, MD   Principle Diagnosis: 70 year old man with kappa light chain multiple myeloma diagnosed in 2018 after presenting with epidural compression and bone disease.    Prior Therapy:  He is status post surgical decompression of an epidural mass on 09/04/2016 and the pathology showed Saunders plasma cell neoplasm.  He is S/P adjuvant radiation therapy to the thoracic spine to be completed on 10/14/2016.  Velcade 1.5 mg/m weekly with dexamethasone 20 mg and Cytoxan 600 mg po. Therapy started on 10/24/2016.   High-dose chemotherapy followed by stem cell transplant: Melphalan 200 mg/m2 completed on 06/17/17 followed by autologous stem cell rescue 06/18/17.  He achieved complete response.      Current therapy:   Zometa 4 mg every 3 months started in February 2020.  Revlimid 10 mg daily for 21 days out of Saunders 28-day started in February 2020.     Interim History:  Alexander Saunders returns today for repeat evaluation.  Since the last visit, he reports no major changes in his health.  He continues to enjoy excellent quality of life and performance status.  He graduated recently after obtaining his degree and seeking employment at this time.  He denies any complications related to Revlimid.  Denies any nausea, fatigue or new dermatological toxicities.  Performance status quality of life remained excellent.      Medications: Updated on review. Current Outpatient Medications  Medication Sig Dispense Refill  . aspirin EC 81 MG tablet Take 81 mg by mouth daily.    . cyclobenzaprine (FLEXERIL) 5 MG tablet Take 1 tablet (5 mg total) by mouth at bedtime as needed for muscle spasms. 30 tablet 1  . diphenhydramine-acetaminophen (TYLENOL PM EXTRA STRENGTH) 25-500 MG TABS tablet Take 1 tablet by mouth at bedtime as needed.    . EARWAX REMOVAL 6.5 % OTIC solution   (Patient not taking: Reported on 02/13/2020)    . gabapentin (NEURONTIN) 300 MG capsule TAKE 3 CAPSULES EVERY MORNING AND 3 CAPSULES EVERY NIGHT AT BEDTIME 180 capsule 3  . lenalidomide (REVLIMID) 10 MG capsule TAKE 1 CAPSULE ONCE DAILY FOR 21 DAYS, THEN 7 DAYS OFF. REPEAT EVERY 28 DAYS. 21 capsule 0  . losartan (COZAAR) 25 MG tablet Take 25 mg by mouth daily.    . Multiple Vitamin (MULTIVITAMIN) capsule Take 1 capsule by mouth daily.    . polyethylene glycol powder (GLYCOLAX/MIRALAX) 17 GM/SCOOP powder Take by mouth as needed.    [name] VIAGRA 100 MG tablet      No current facility-administered medications for this visit.     Allergies: No Known Allergies    Physical Exam:     Blood pressure 122/75, pulse 71, temperature 97.7 F (36.5 C), temperature source Tympanic, resp. rate 18, weight 205 lb 8 oz (93.2 kg), SpO2 100 %.      ECOG: 1     General appearance: Comfortable appearing without any discomfort Head: Normocephalic without any trauma Oropharynx: Mucous membranes are moist and pink without any thrush or ulcers. Eyes: Pupils are equal and round reactive to light. Lymph nodes: No cervical, supraclavicular, inguinal or axillary lymphadenopathy.   Heart:regular rate and rhythm.  S1 and S2 without leg edema. Lung: Clear without any rhonchi or wheezes.  No dullness to percussion. Abdomin: Soft, nontender, nondistended with good bowel sounds.  No hepatosplenomegaly. Musculoskeletal: No joint deformity or effusion.  Full range of motion noted. Neurological: No deficits  noted on motor, sensory and deep tendon reflex exam. Skin: No petechial rash or dryness.  Appeared moist.            Lab Results: Lab Results  Component Value Date   WBC 5.9 11/16/2019   HGB 12.6 (L) 11/16/2019   HCT 37.3 (L) 11/16/2019   MCV 95.4 11/16/2019   PLT 272 11/16/2019     Chemistry      Component Value Date/Time   NA 142 11/16/2019 0844   NA 137 02/06/2017 0828   K 3.6 11/16/2019  0844   K 4.0 02/06/2017 0828   CL 108 11/16/2019 0844   CO2 28 11/16/2019 0844   CO2 24 02/06/2017 0828   BUN 7 (L) 11/16/2019 0844   BUN 14.9 02/06/2017 0828   CREATININE 0.88 11/16/2019 0844   CREATININE 0.9 02/06/2017 0828      Component Value Date/Time   CALCIUM 9.3 11/16/2019 0844   CALCIUM 9.1 02/06/2017 0828   ALKPHOS 29 (L) 11/16/2019 0844   ALKPHOS 31 (L) 02/06/2017 0828   AST 43 (H) 11/16/2019 0844   AST 32 02/06/2017 0828   ALT 29 11/16/2019 0844   ALT 43 02/06/2017 0828   BILITOT 0.5 11/16/2019 0844   BILITOT 0.42 02/06/2017 0828      Results for Alexander Saunders, Alexander Saunders (MRN [id number]) as of 02/16/2020 08:50  Ref. Range 11/16/2019 08:45  M Protein SerPl Elph-Mcnc Latest Ref Range: Not Observed g/dL Not Observed  IFE 1 Unknown Comment  Globulin, Total Latest Ref Range: 2.2 - 3.9 g/dL 2.8  B-Globulin SerPl Elph-Mcnc Latest Ref Range: 0.7 - 1.3 g/dL 0.8  IgG (Immunoglobin G), Serum Latest Ref Range: 603 - 1,613 mg/dL 1,260  IgM (Immunoglobulin M), Srm Latest Ref Range: 20 - 172 mg/dL 36  IgA Latest Ref Range: 61 - 437 mg/dL 171     Results for Alexander Saunders, Alexander Saunders (MRN [id number]) as of 02/16/2020 08:50  Ref. Range 05/10/2019 11:41 11/16/2019 08:44  Kappa free light chain Latest Ref Range: 3.3 - 19.4 mg/L 30.4 (H) 41.4 (H)  Lamda free light chains Latest Ref Range: 5.7 - 26.3 mg/L 25.6 24.4  Kappa, lamda light chain ratio Latest Ref Range: 0.26 - 1.65  1.19 1.70 (H)       Impression and Plan:  69-year-old man with:  1.  Kappa light chain multiple myeloma diagnosed in 2018 after presenting with epidural compression and bone disease.    He is currently on Revlimid maintenance and close to completing 2 years of therapy.  He is kappa light chain showed slight increase in the last 6 to 8 months with slightly increased kappa to lambda ratio.  No evidence to suggest endorgan damage at this time.  Risks and benefits of continuing Revlimid were reviewed at this time.  Salvage  relapsed option to treat multiple myeloma were discussed including daratumumab based regimens among others.  After discussion today, we opted to continue Revlimid at this time.  We will continue to evaluate periodically.    2.  Bone health: He is currently on Zometa and will complete 2 years of therapy with his infusion today.  This will be reinstituted if he develops worsening disease in the future.   3.  Neuropathy and chronic back pain: Remains stable currently on Neurontin.  4.  Immunization: He is up-to-date at this time.  5. Follow-up: In 3 months for repeat follow-up.  30 minutes were spent on this encounter.  Time was dedicated to reviewing his laboratory data, disease status   update treatment options for the future.   Zola Button, MD 1/13/20228:48 AM

## 2020-02-16 NOTE — Patient Instructions (Signed)
Zoledronic Acid injection (Hypercalcemia, Oncology) What is this medicine? ZOLEDRONIC ACID (ZOE le dron ik AS id) lowers the amount of calcium loss from bone. It is used to treat too much calcium in your blood from cancer. It is also used to prevent complications of cancer that has spread to the bone. This medicine may be used for other purposes; ask your health care provider or pharmacist if you have questions. COMMON BRAND NAME(S): Zometa What should I tell my health care provider before I take this medicine? They need to know if you have any of these conditions:  aspirin-sensitive asthma  cancer, especially if you are receiving medicines used to treat cancer  dental disease or wear dentures  infection  kidney disease  receiving corticosteroids like dexamethasone or prednisone  an unusual or allergic reaction to zoledronic acid, other medicines, foods, dyes, or preservatives  pregnant or trying to get pregnant  breast-feeding How should I use this medicine? This medicine is for infusion into a vein. It is given by a health care professional in a hospital or clinic setting. Talk to your pediatrician regarding the use of this medicine in children. Special care may be needed. Overdosage: If you think you have taken too much of this medicine contact a poison control center or emergency room at once. NOTE: This medicine is only for you. Do not share this medicine with others. What if I miss a dose? It is important not to miss your dose. Call your doctor or health care professional if you are unable to keep an appointment. What may interact with this medicine?  certain antibiotics given by injection  NSAIDs, medicines for pain and inflammation, like ibuprofen or naproxen  some diuretics like bumetanide, furosemide  teriparatide  thalidomide This list may not describe all possible interactions. Give your health care provider a list of all the medicines, herbs, non-prescription  drugs, or dietary supplements you use. Also tell them if you smoke, drink alcohol, or use illegal drugs. Some items may interact with your medicine. What should I watch for while using this medicine? Visit your doctor or health care professional for regular checkups. It may be some time before you see the benefit from this medicine. Do not stop taking your medicine unless your doctor tells you to. Your doctor may order blood tests or other tests to see how you are doing. Women should inform their doctor if they wish to become pregnant or think they might be pregnant. There is a potential for serious side effects to an unborn child. Talk to your health care professional or pharmacist for more information. You should make sure that you get enough calcium and vitamin D while you are taking this medicine. Discuss the foods you eat and the vitamins you take with your health care professional. Some people who take this medicine have severe bone, joint, and/or muscle pain. This medicine may also increase your risk for jaw problems or a broken thigh bone. Tell your doctor right away if you have severe pain in your jaw, bones, joints, or muscles. Tell your doctor if you have any pain that does not go away or that gets worse. Tell your dentist and dental surgeon that you are taking this medicine. You should not have major dental surgery while on this medicine. See your dentist to have a dental exam and fix any dental problems before starting this medicine. Take good care of your teeth while on this medicine. Make sure you see your dentist for regular follow-up   appointments. What side effects may I notice from receiving this medicine? Side effects that you should report to your doctor or health care professional as soon as possible:  allergic reactions like skin rash, itching or hives, swelling of the face, lips, or tongue  anxiety, confusion, or depression  breathing problems  changes in vision  eye  pain  feeling faint or lightheaded, falls  jaw pain, especially after dental work  mouth sores  muscle cramps, stiffness, or weakness  redness, blistering, peeling or loosening of the skin, including inside the mouth  trouble passing urine or change in the amount of urine Side effects that usually do not require medical attention (report to your doctor or health care professional if they continue or are bothersome):  bone, joint, or muscle pain  constipation  diarrhea  fever  hair loss  irritation at site where injected  loss of appetite  nausea, vomiting  stomach upset  trouble sleeping  trouble swallowing  weak or tired This list may not describe all possible side effects. Call your doctor for medical advice about side effects. You may report side effects to FDA at 1-800-FDA-1088. Where should I keep my medicine? This drug is given in a hospital or clinic and will not be stored at home. NOTE: This sheet is a summary. It may not cover all possible information. If you have questions about this medicine, talk to your doctor, pharmacist, or health care provider.  2020 Elsevier/Gold Standard (2013-06-18 14:19:39)  

## 2020-02-17 LAB — KAPPA/LAMBDA LIGHT CHAINS
Kappa free light chain: 39.5 mg/L — ABNORMAL HIGH (ref 3.3–19.4)
Kappa, lambda light chain ratio: 1.34 (ref 0.26–1.65)
Lambda free light chains: 29.5 mg/L — ABNORMAL HIGH (ref 5.7–26.3)

## 2020-02-21 LAB — MULTIPLE MYELOMA PANEL, SERUM
Albumin SerPl Elph-Mcnc: 4 g/dL (ref 2.9–4.4)
Albumin/Glob SerPl: 1.3 (ref 0.7–1.7)
Alpha 1: 0.2 g/dL (ref 0.0–0.4)
Alpha2 Glob SerPl Elph-Mcnc: 0.7 g/dL (ref 0.4–1.0)
B-Globulin SerPl Elph-Mcnc: 0.9 g/dL (ref 0.7–1.3)
Gamma Glob SerPl Elph-Mcnc: 1.4 g/dL (ref 0.4–1.8)
Globulin, Total: 3.2 g/dL (ref 2.2–3.9)
IgA: 201 mg/dL (ref 61–437)
IgG (Immunoglobin G), Serum: 1502 mg/dL (ref 603–1613)
IgM (Immunoglobulin M), Srm: 41 mg/dL (ref 20–172)
Total Protein ELP: 7.2 g/dL (ref 6.0–8.5)

## 2020-02-23 ENCOUNTER — Encounter: Payer: Self-pay | Admitting: Oncology

## 2020-02-28 DIAGNOSIS — I10 Essential (primary) hypertension: Secondary | ICD-10-CM | POA: Diagnosis not present

## 2020-02-28 DIAGNOSIS — D638 Anemia in other chronic diseases classified elsewhere: Secondary | ICD-10-CM | POA: Diagnosis not present

## 2020-02-28 DIAGNOSIS — Z0001 Encounter for general adult medical examination with abnormal findings: Secondary | ICD-10-CM | POA: Diagnosis not present

## 2020-02-28 DIAGNOSIS — R972 Elevated prostate specific antigen [PSA]: Secondary | ICD-10-CM | POA: Diagnosis not present

## 2020-03-14 ENCOUNTER — Other Ambulatory Visit: Payer: Self-pay | Admitting: Oncology

## 2020-04-10 ENCOUNTER — Other Ambulatory Visit: Payer: Self-pay

## 2020-04-10 MED ORDER — LENALIDOMIDE 10 MG PO CAPS: ORAL_CAPSULE | ORAL | 0 refills | Status: DC

## 2020-04-18 ENCOUNTER — Encounter: Payer: Self-pay | Admitting: Oncology

## 2020-04-21 ENCOUNTER — Other Ambulatory Visit: Payer: Self-pay | Admitting: Oncology

## 2020-05-14 ENCOUNTER — Other Ambulatory Visit: Payer: Self-pay | Admitting: Oncology

## 2020-05-15 ENCOUNTER — Telehealth: Payer: Self-pay | Admitting: Oncology

## 2020-05-15 ENCOUNTER — Other Ambulatory Visit: Payer: Self-pay | Admitting: *Deleted

## 2020-05-15 ENCOUNTER — Telehealth: Payer: Self-pay | Admitting: *Deleted

## 2020-05-15 DIAGNOSIS — Z8579 Personal history of other malignant neoplasms of lymphoid, hematopoietic and related tissues: Secondary | ICD-10-CM | POA: Diagnosis not present

## 2020-05-15 DIAGNOSIS — G620 Drug-induced polyneuropathy: Secondary | ICD-10-CM | POA: Diagnosis not present

## 2020-05-15 DIAGNOSIS — D638 Anemia in other chronic diseases classified elsewhere: Secondary | ICD-10-CM | POA: Diagnosis not present

## 2020-05-15 DIAGNOSIS — I10 Essential (primary) hypertension: Secondary | ICD-10-CM | POA: Diagnosis not present

## 2020-05-15 MED ORDER — LENALIDOMIDE 10 MG PO CAPS: ORAL_CAPSULE | ORAL | 0 refills | Status: DC

## 2020-05-15 NOTE — Telephone Encounter (Signed)
Called pt to r/s appt. No answer. Left msg for pt to call back to r/s.

## 2020-05-15 NOTE — Telephone Encounter (Signed)
Patient called to reschedule upcoming appointment, scheduling message sent.

## 2020-05-15 NOTE — Telephone Encounter (Signed)
Called patient regarding 04/12 staff message, left a voicemail.

## 2020-05-17 ENCOUNTER — Inpatient Hospital Stay: Payer: Medicare Other | Admitting: Oncology

## 2020-05-17 ENCOUNTER — Telehealth: Payer: Self-pay | Admitting: Oncology

## 2020-05-17 ENCOUNTER — Inpatient Hospital Stay: Payer: Medicare Other

## 2020-05-17 NOTE — Telephone Encounter (Signed)
Called patient regarding 04/14 scheduled message, cancelled 04/14 appointments and left a voicemail to reschedule.

## 2020-06-04 DIAGNOSIS — Z20822 Contact with and (suspected) exposure to covid-19: Secondary | ICD-10-CM | POA: Diagnosis not present

## 2020-06-11 ENCOUNTER — Other Ambulatory Visit: Payer: Self-pay | Admitting: Oncology

## 2020-06-12 ENCOUNTER — Telehealth: Payer: Self-pay | Admitting: Oncology

## 2020-06-12 NOTE — Telephone Encounter (Signed)
R/s appt per 5/10 sch msg. Called pt, no answer. Left msg with appt date and time.  

## 2020-06-13 ENCOUNTER — Telehealth: Payer: Self-pay | Admitting: Oncology

## 2020-06-13 ENCOUNTER — Telehealth: Payer: Self-pay | Admitting: *Deleted

## 2020-06-13 ENCOUNTER — Other Ambulatory Visit: Payer: Self-pay | Admitting: *Deleted

## 2020-06-13 DIAGNOSIS — C9000 Multiple myeloma not having achieved remission: Secondary | ICD-10-CM

## 2020-06-13 MED ORDER — LENALIDOMIDE 10 MG PO CAPS: ORAL_CAPSULE | ORAL | 0 refills | Status: DC

## 2020-06-13 NOTE — Progress Notes (Signed)
[place] to provide [employer] # for Revlimid Rx escribed by Dr. Alen Blew earlier today. [employer] auth# [id number] given to [facility] Advocate Izora Gala B. She stated she sent to pharmacist.

## 2020-06-13 NOTE — Telephone Encounter (Signed)
Scheduled appt per 5/11 sch msg. PT aware.

## 2020-06-13 NOTE — Telephone Encounter (Signed)
Patient called - Alexander Saunders requesting to r/s appts for lab/Dr. Alen Blew on 5/12. Contacted patient - Alexander Saunders: patient last seen/had lab work in January 2022. Patient due for refill of Revlimid per pharmacy request. He needs to be seen by MD and have labs as soon as possible. Requested patient contact office to r/s. Patient returned call - requested to change appt to next week. [facility] [employer] schedule message sent.

## 2020-06-14 ENCOUNTER — Inpatient Hospital Stay: Payer: TRICARE For Life (TFL)

## 2020-06-14 ENCOUNTER — Inpatient Hospital Stay: Payer: TRICARE For Life (TFL) | Admitting: Oncology

## 2020-06-20 ENCOUNTER — Inpatient Hospital Stay: Payer: Medicare Other | Attending: Oncology

## 2020-06-20 ENCOUNTER — Inpatient Hospital Stay (HOSPITAL_BASED_OUTPATIENT_CLINIC_OR_DEPARTMENT_OTHER): Payer: Medicare Other | Admitting: Oncology

## 2020-06-20 ENCOUNTER — Other Ambulatory Visit: Payer: Self-pay

## 2020-06-20 VITALS — BP 124/71 | HR 64 | Temp 96.1°F | Resp 20 | Wt 197.2 lb

## 2020-06-20 DIAGNOSIS — C9000 Multiple myeloma not having achieved remission: Secondary | ICD-10-CM | POA: Insufficient documentation

## 2020-06-20 DIAGNOSIS — G620 Drug-induced polyneuropathy: Secondary | ICD-10-CM | POA: Insufficient documentation

## 2020-06-20 DIAGNOSIS — G893 Neoplasm related pain (acute) (chronic): Secondary | ICD-10-CM | POA: Insufficient documentation

## 2020-06-20 LAB — CBC WITH DIFFERENTIAL (CANCER CENTER ONLY)
Abs Immature Granulocytes: 0.03 10*3/uL (ref 0.00–0.07)
Basophils Absolute: 0.1 10*3/uL (ref 0.0–0.1)
Basophils Relative: 1 %
Eosinophils Absolute: 0.3 10*3/uL (ref 0.0–0.5)
Eosinophils Relative: 6 %
HCT: 35.3 % — ABNORMAL LOW (ref 39.0–52.0)
Hemoglobin: 11.9 g/dL — ABNORMAL LOW (ref 13.0–17.0)
Immature Granulocytes: 1 %
Lymphocytes Relative: 20 %
Lymphs Abs: 1.1 10*3/uL (ref 0.7–4.0)
MCH: 32.4 pg (ref 26.0–34.0)
MCHC: 33.7 g/dL (ref 30.0–36.0)
MCV: 96.2 fL (ref 80.0–100.0)
Monocytes Absolute: 0.7 10*3/uL (ref 0.1–1.0)
Monocytes Relative: 14 %
Neutro Abs: 3 10*3/uL (ref 1.7–7.7)
Neutrophils Relative %: 58 %
Platelet Count: 292 10*3/uL (ref 150–400)
RBC: 3.67 MIL/uL — ABNORMAL LOW (ref 4.22–5.81)
RDW: 14.8 % (ref 11.5–15.5)
WBC Count: 5.2 10*3/uL (ref 4.0–10.5)
nRBC: 0 % (ref 0.0–0.2)

## 2020-06-20 LAB — CMP (CANCER CENTER ONLY)
ALT: 7 U/L (ref 0–44)
AST: 12 U/L — ABNORMAL LOW (ref 15–41)
Albumin: 3.6 g/dL (ref 3.5–5.0)
Alkaline Phosphatase: 32 U/L — ABNORMAL LOW (ref 38–126)
Anion gap: 8 (ref 5–15)
BUN: 12 mg/dL (ref 8–23)
CO2: 27 mmol/L (ref 22–32)
Calcium: 9.5 mg/dL (ref 8.9–10.3)
Chloride: 107 mmol/L (ref 98–111)
Creatinine: 0.91 mg/dL (ref 0.61–1.24)
GFR, Estimated: >60 mL/min (ref >60)
Glucose, Bld: 108 mg/dL — ABNORMAL HIGH (ref 70–99)
Potassium: 3.8 mmol/L (ref 3.5–5.1)
Sodium: 142 mmol/L (ref 135–145)
Total Bilirubin: 0.7 mg/dL (ref 0.3–1.2)
Total Protein: 7.2 g/dL (ref 6.5–8.1)

## 2020-06-20 MED ORDER — GABAPENTIN 300 MG PO CAPS: ORAL_CAPSULE | ORAL | 3 refills | Status: DC

## 2020-06-20 NOTE — Progress Notes (Signed)
Hematology and Oncology Follow Up Visit  Alexander Saunders [id number] 08-Nov-1950 70 y.o. 06/20/2020 8:05 AM Alexander Saunders MDOsei-Bonsu, [name], MD   Principle Diagnosis: 70 year old man with multiple myeloma diagnosed with epidural mass in 2018.  He was found to have kappa light chain subtype.    Prior Therapy:  He is status post surgical decompression of an epidural mass on 09/04/2016 and the pathology showed a plasma cell neoplasm.  He is S/P adjuvant radiation therapy to the thoracic spine to be completed on 10/14/2016.  Velcade 1.5 mg/m weekly with dexamethasone 20 mg and Cytoxan 600 mg po. Therapy started on 10/24/2016.   High-dose chemotherapy followed by stem cell transplant: Melphalan 200 mg/m2 completed on 06/17/17 followed by autologous stem cell rescue 06/18/17.  He achieved complete response.   Zometa 4 mg every 3 months started in February 2020.  Last treatment given in January 2022.   Current therapy:     Revlimid 10 mg daily for 21 days out of a 28-day started in February 2020.     Interim History:  Alexander Saunders is here for a follow-up visit.  Since the last visit, he reports feeling well without any major complaints.  He has tolerated Revlimid maintenance without any complications.  He continues to be active and attends activities of daily living.  He is working part-time as a Physiological scientist.  He denies any worsening neuropathy that is manageable overall with gabapentin.  Denies any infections or hospitalization.  He denies any bone pain or pathological fractures.      Medications: Unchanged on review. Current Outpatient Medications  Medication Sig Dispense Refill  . aspirin EC 81 MG tablet Take 81 mg by mouth daily.    . cyclobenzaprine (FLEXERIL) 5 MG tablet Take 1 tablet (5 mg total) by mouth at bedtime as needed for muscle spasms. 30 tablet 1  . diphenhydramine-acetaminophen (TYLENOL PM EXTRA STRENGTH) 25-500 MG TABS tablet Take 1 tablet by mouth at  bedtime as needed.    . EARWAX REMOVAL 6.5 % OTIC solution  (Patient not taking: Reported on 02/13/2020)    . gabapentin (NEURONTIN) 300 MG capsule TAKE 3 CAPSULE BY MOUTH THREE TIMES DAILY. 3 CAPSULE IN THE MORNING 3 CAPSULE IN THE AFTERNOON AND 3 CAPSULE AT NIGHT 270 capsule 3  . lenalidomide (REVLIMID) 10 MG capsule TAKE 1 CAPSULE ONCE DAILY FOR 21 DAYS, THEN 7 DAYS OFF. REPEAT EVERY 28 DAYS. 21 capsule 0  . losartan (COZAAR) 25 MG tablet Take 25 mg by mouth daily.    . Multiple Vitamin (MULTIVITAMIN) capsule Take 1 capsule by mouth daily.    . polyethylene glycol powder (GLYCOLAX/MIRALAX) 17 GM/SCOOP powder Take by mouth as needed.    [name] VIAGRA 100 MG tablet      No current facility-administered medications for this visit.     Allergies: No Known Allergies    Physical Exam:     Blood pressure 124/71, pulse 64, temperature (!) 96.1 F (35.6 C), temperature source Tympanic, resp. rate 20, weight 197 lb 4 oz (89.5 kg), SpO2 100 %.       ECOG: 1   General appearance: Alert, awake without any distress. Head: Atraumatic without abnormalities Oropharynx: Without any thrush or ulcers. Eyes: No scleral icterus. Lymph nodes: No lymphadenopathy noted in the cervical, supraclavicular, or axillary nodes Heart:regular rate and rhythm, without any murmurs or gallops.   Lung: Clear to auscultation without any rhonchi, wheezes or dullness to percussion. Abdomin: Soft, nontender without any shifting dullness or ascites. Musculoskeletal:  No clubbing or cyanosis. Neurological: No motor or sensory deficits. Skin: No rashes or lesions.           Lab Results: Lab Results  Component Value Date   WBC 5.8 02/16/2020   HGB 12.6 (L) 02/16/2020   HCT 37.1 (L) 02/16/2020   MCV 95.4 02/16/2020   PLT 277 02/16/2020     Chemistry      Component Value Date/Time   NA 140 02/16/2020 0859   NA 137 02/06/2017 0828   K 3.5 02/16/2020 0859   K 4.0 02/06/2017 0828   CL 108 02/16/2020  0859   CO2 25 02/16/2020 0859   CO2 24 02/06/2017 0828   BUN 9 02/16/2020 0859   BUN 14.9 02/06/2017 0828   CREATININE 0.97 02/16/2020 0859   CREATININE 0.9 02/06/2017 0828      Component Value Date/Time   CALCIUM 9.5 02/16/2020 0859   CALCIUM 9.1 02/06/2017 0828   ALKPHOS 35 (L) 02/16/2020 0859   ALKPHOS 31 (L) 02/06/2017 0828   AST 15 02/16/2020 0859   AST 32 02/06/2017 0828   ALT 16 02/16/2020 0859   ALT 43 02/06/2017 0828   BILITOT 0.8 02/16/2020 0859   BILITOT 0.42 02/06/2017 0828     Results for Alexander Saunders (MRN [id number]) as of 06/20/2020 08:08  Ref. Range 11/16/2019 08:45 02/16/2020 08:59  M Protein SerPl Elph-Mcnc Latest Ref Range: Not Observed g/dL Not Observed Not Observed  IFE 1 Unknown Comment Comment  Globulin, Total Latest Ref Range: 2.2 - 3.9 g/dL 2.8 3.2  B-Globulin SerPl Elph-Mcnc Latest Ref Range: 0.7 - 1.3 g/dL 0.8 0.9  IgG (Immunoglobin G), Serum Latest Ref Range: 603 - 1,613 mg/dL 1,260 1,502  IgM (Immunoglobulin M), Srm Latest Ref Range: 20 - 172 mg/dL 36 41  IgA Latest Ref Range: 61 - 437 mg/dL 171 201    Results for Alexander Saunders (MRN [id number]) as of 06/20/2020 08:08  Ref. Range 02/16/2020 08:59  Kappa free light chain Latest Ref Range: 3.3 - 19.4 mg/L 39.5 (H)  Lamda free light chains Latest Ref Range: 5.7 - 26.3 mg/L 29.5 (H)  Kappa, lamda light chain ratio Latest Ref Range: 0.26 - 1.65  1.34     Impression and Plan:  70 year old man with:  1.  Multiple myeloma diagnosed with epidural compression in 2018.  He was found to have Kappa light chain subtype.  His disease status was updated at this time including updating his protein studies from January 2022.  He continues to have a complete response at this time with normalization of his kappa to lambda light chain.  He continues to tolerate Revlimid without any new complaints.  Risks and benefits of continuing this treatment indefinitely were discussed.  At this time he opted to continue  which is reasonable until disease progression.  Different salvage therapy will be used if he developed relapsed disease predominantly daratumumab based therapy.  He is agreeable to continue at this time.    2.  Bone health: He completed 2 years of Zometa will continue to be on hold for the time being.  This will be reinstituted if he developed bone disease in the future.   3.  Neuropathy and chronic back pain: Related due to his tumor and treatment.  He is currently on Neurontin and manageable.  This will be refilled for him today.  4.  Immunization: He has completed vaccination series at this time post stem cell transplant.  5. Follow-up: He will return in 3  months for repeat follow-up.  30 minutes were dedicated to this visit.  The time was spent on reviewing laboratory data, disease status update and future treatment options.   Zola Button, MD 5/18/20228:05 AM

## 2020-06-21 LAB — KAPPA/LAMBDA LIGHT CHAINS
Kappa free light chain: 45.5 mg/L — ABNORMAL HIGH (ref 3.3–19.4)
Kappa, lambda light chain ratio: 2.24 — ABNORMAL HIGH (ref 0.26–1.65)
Lambda free light chains: 20.3 mg/L (ref 5.7–26.3)

## 2020-06-25 LAB — MULTIPLE MYELOMA PANEL, SERUM
Albumin SerPl Elph-Mcnc: 3.5 g/dL (ref 2.9–4.4)
Albumin/Glob SerPl: 1.1 (ref 0.7–1.7)
Alpha 1: 0.2 g/dL (ref 0.0–0.4)
Alpha2 Glob SerPl Elph-Mcnc: 0.8 g/dL (ref 0.4–1.0)
B-Globulin SerPl Elph-Mcnc: 0.9 g/dL (ref 0.7–1.3)
Gamma Glob SerPl Elph-Mcnc: 1.4 g/dL (ref 0.4–1.8)
Globulin, Total: 3.3 g/dL (ref 2.2–3.9)
IgA: 145 mg/dL (ref 61–437)
IgG (Immunoglobin G), Serum: 1455 mg/dL (ref 603–1613)
IgM (Immunoglobulin M), Srm: 28 mg/dL (ref 20–172)
Total Protein ELP: 6.8 g/dL (ref 6.0–8.5)

## 2020-07-12 ENCOUNTER — Other Ambulatory Visit: Payer: Self-pay | Admitting: Oncology

## 2020-07-12 DIAGNOSIS — C9000 Multiple myeloma not having achieved remission: Secondary | ICD-10-CM

## 2020-07-16 ENCOUNTER — Other Ambulatory Visit: Payer: Self-pay

## 2020-07-16 ENCOUNTER — Encounter: Payer: Self-pay | Admitting: Oncology

## 2020-08-10 DIAGNOSIS — Z9484 Stem cells transplant status: Secondary | ICD-10-CM | POA: Diagnosis not present

## 2020-08-10 DIAGNOSIS — G629 Polyneuropathy, unspecified: Secondary | ICD-10-CM | POA: Diagnosis not present

## 2020-08-10 DIAGNOSIS — C9000 Multiple myeloma not having achieved remission: Secondary | ICD-10-CM | POA: Diagnosis not present

## 2020-08-10 DIAGNOSIS — C9001 Multiple myeloma in remission: Secondary | ICD-10-CM | POA: Diagnosis not present

## 2020-08-13 ENCOUNTER — Other Ambulatory Visit: Payer: Self-pay | Admitting: *Deleted

## 2020-08-13 ENCOUNTER — Telehealth: Payer: Self-pay | Admitting: *Deleted

## 2020-08-13 DIAGNOSIS — C9000 Multiple myeloma not having achieved remission: Secondary | ICD-10-CM

## 2020-08-13 MED ORDER — LENALIDOMIDE 10 MG PO CAPS: ORAL_CAPSULE | ORAL | 0 refills | Status: DC

## 2020-08-13 NOTE — Telephone Encounter (Signed)
Returned PC to patient, informed him refill request for revlimid has been sent.  He states he was seen at [facility] on 08/10/20 & had labs.  He is asking if he still needs to keep his appointment with Dr. Alen Blew on 09/19/20 since he was recently seen at another cancer center.  Staff message sent to Dr. Alen Blew, informed patient we will let him know about upcoming appointment.  Patient verbalizes understanding.

## 2020-08-15 ENCOUNTER — Telehealth: Payer: Self-pay | Admitting: *Deleted

## 2020-08-15 NOTE — Telephone Encounter (Signed)
-----   Message from Wyatt Portela, MD sent at 08/14/2020  7:14 AM EDT ----- Regarding: RE: Appointment He can rescheduled to the week of 10/3. Thanks ----- Message ----- From: Rolene Course, RN Sent: 08/13/2020   4:02 PM EDT To: Wyatt Portela, MD Subject: Appointment                                    This patient called, has was seen at [facility] on 08/10/20 & had labs - the results are in epic.  He is asking if he still needs to keep his appointment with you on 09/19/20 since he was so recently seen at [facility].  Please let me know.  Thanks, Bethena Roys

## 2020-08-15 NOTE — Telephone Encounter (Signed)
PC to patient, no answer, left VM.  Informed patient Dr. Alen Blew says his August appointment can be moved out to week of October 3.  Scheduling message sent.

## 2020-09-04 ENCOUNTER — Other Ambulatory Visit: Payer: Self-pay | Admitting: Oncology

## 2020-09-04 DIAGNOSIS — C9000 Multiple myeloma not having achieved remission: Secondary | ICD-10-CM

## 2020-09-19 ENCOUNTER — Ambulatory Visit: Payer: TRICARE For Life (TFL) | Admitting: Oncology

## 2020-09-19 ENCOUNTER — Other Ambulatory Visit: Payer: TRICARE For Life (TFL)

## 2020-10-03 ENCOUNTER — Other Ambulatory Visit: Payer: Self-pay | Admitting: Oncology

## 2020-10-03 DIAGNOSIS — C9000 Multiple myeloma not having achieved remission: Secondary | ICD-10-CM

## 2020-10-04 ENCOUNTER — Other Ambulatory Visit: Payer: Self-pay | Admitting: *Deleted

## 2020-10-04 DIAGNOSIS — C9000 Multiple myeloma not having achieved remission: Secondary | ICD-10-CM

## 2020-10-04 MED ORDER — LENALIDOMIDE 10 MG PO CAPS: ORAL_CAPSULE | ORAL | 0 refills | Status: DC

## 2020-10-05 ENCOUNTER — Encounter: Payer: Self-pay | Admitting: Oncology

## 2020-11-03 ENCOUNTER — Other Ambulatory Visit: Payer: Self-pay | Admitting: Oncology

## 2020-11-03 ENCOUNTER — Emergency Department (HOSPITAL_COMMUNITY): Payer: Medicare Other

## 2020-11-03 ENCOUNTER — Encounter (HOSPITAL_COMMUNITY): Payer: Self-pay

## 2020-11-03 ENCOUNTER — Encounter: Payer: Self-pay | Admitting: Oncology

## 2020-11-03 ENCOUNTER — Emergency Department (HOSPITAL_COMMUNITY)
Admission: EM | Admit: 2020-11-03 | Discharge: 2020-11-03 | Disposition: A | Discharge status: Home / Self Care | Payer: Medicare Other | Attending: Emergency Medicine | Admitting: Emergency Medicine

## 2020-11-03 ENCOUNTER — Other Ambulatory Visit: Payer: Self-pay

## 2020-11-03 DIAGNOSIS — D649 Anemia, unspecified: Secondary | ICD-10-CM | POA: Diagnosis not present

## 2020-11-03 DIAGNOSIS — R509 Fever, unspecified: Secondary | ICD-10-CM | POA: Diagnosis not present

## 2020-11-03 DIAGNOSIS — U071 COVID-19: Secondary | ICD-10-CM | POA: Diagnosis not present

## 2020-11-03 DIAGNOSIS — C9000 Multiple myeloma not having achieved remission: Secondary | ICD-10-CM

## 2020-11-03 DIAGNOSIS — Z7982 Long term (current) use of aspirin: Secondary | ICD-10-CM | POA: Diagnosis not present

## 2020-11-03 DIAGNOSIS — R059 Cough, unspecified: Secondary | ICD-10-CM | POA: Diagnosis not present

## 2020-11-03 LAB — CBC WITH DIFFERENTIAL/PLATELET
Abs Immature Granulocytes: 0.06 10*3/uL (ref 0.00–0.07)
Basophils Absolute: 0 10*3/uL (ref 0.0–0.1)
Basophils Relative: 0 %
Eosinophils Absolute: 0 10*3/uL (ref 0.0–0.5)
Eosinophils Relative: 1 %
HCT: 27.9 % — ABNORMAL LOW (ref 39.0–52.0)
Hemoglobin: 9.1 g/dL — ABNORMAL LOW (ref 13.0–17.0)
Immature Granulocytes: 1 %
Lymphocytes Relative: 15 %
Lymphs Abs: 1.1 10*3/uL (ref 0.7–4.0)
MCH: 31.8 pg (ref 26.0–34.0)
MCHC: 32.6 g/dL (ref 30.0–36.0)
MCV: 97.6 fL (ref 80.0–100.0)
Monocytes Absolute: 1.4 10*3/uL — ABNORMAL HIGH (ref 0.1–1.0)
Monocytes Relative: 20 %
Neutro Abs: 4.5 10*3/uL (ref 1.7–7.7)
Neutrophils Relative %: 63 %
Platelets: 320 10*3/uL (ref 150–400)
RBC: 2.86 MIL/uL — ABNORMAL LOW (ref 4.22–5.81)
RDW: 15.6 % — ABNORMAL HIGH (ref 11.5–15.5)
WBC: 7.1 10*3/uL (ref 4.0–10.5)
nRBC: 0 % (ref 0.0–0.2)

## 2020-11-03 LAB — COMPREHENSIVE METABOLIC PANEL
ALT: 42 U/L (ref 0–44)
AST: 35 U/L (ref 15–41)
Albumin: 3.6 g/dL (ref 3.5–5.0)
Alkaline Phosphatase: 68 U/L (ref 38–126)
Anion gap: 11 (ref 5–15)
BUN: 12 mg/dL (ref 8–23)
CO2: 22 mmol/L (ref 22–32)
Calcium: 9.2 mg/dL (ref 8.9–10.3)
Chloride: 102 mmol/L (ref 98–111)
Creatinine, Ser: 1 mg/dL (ref 0.61–1.24)
GFR, Estimated: >60 mL/min (ref >60)
Glucose, Bld: 110 mg/dL — ABNORMAL HIGH (ref 70–99)
Potassium: 3.8 mmol/L (ref 3.5–5.1)
Sodium: 135 mmol/L (ref 135–145)
Total Bilirubin: 1.5 mg/dL — ABNORMAL HIGH (ref 0.3–1.2)
Total Protein: 7.5 g/dL (ref 6.5–8.1)

## 2020-11-03 LAB — URINALYSIS, ROUTINE W REFLEX MICROSCOPIC
Bilirubin Urine: NEGATIVE
Glucose, UA: NEGATIVE mg/dL
Hgb urine dipstick: NEGATIVE
Ketones, ur: NEGATIVE mg/dL
Leukocytes,Ua: NEGATIVE
Nitrite: NEGATIVE
Protein, ur: NEGATIVE mg/dL
Specific Gravity, Urine: 1.012 (ref 1.005–1.030)
pH: 5 (ref 5.0–8.0)

## 2020-11-03 LAB — POC OCCULT BLOOD, ED: Fecal Occult Bld: NEGATIVE

## 2020-11-03 LAB — RESP PANEL BY RT-PCR (FLU A&B, COVID) ARPGX2
Influenza A by PCR: NEGATIVE
Influenza B by PCR: NEGATIVE
SARS Coronavirus 2 by RT PCR: POSITIVE — AB

## 2020-11-03 MED ORDER — NIRMATRELVIR/RITONAVIR (PAXLOVID)TABLET: 3.0000 | ORAL_TABLET | Freq: Two times a day (BID) | ORAL | 0 refills | Status: AC

## 2020-11-03 NOTE — ED Triage Notes (Signed)
Pt arrived via POV, c/o fever/cough since Tuesday. Denies any known sick contacts.

## 2020-11-03 NOTE — ED Provider Notes (Signed)
Emergency Medicine Provider Triage Evaluation Note  Alexander Saunders , a 70 y.o. male  was evaluated in triage.  Pt complains of fever and cough.  Patient states that the past few days, he has had fever with a T-max of 101, productive cough producing greenish mucus, fatigue.  He also reports urinary frequency.  No chest pain, shortness of breath, abdominal pain, or change in bowel movements.  He is on Revlimid for multiple myeloma, 21 days on 7 days off.  He finishes his last dose tomorrow before he is off for a week.  He denies sick contacts.  Review of Systems  Positive: Fever, cough, urinary frequency  Negative: Cp, sob  Physical Exam  BP 97/78 (BP Location: Left Arm)   Pulse 97   Temp 98.8 F (37.1 C) (Oral)   Resp 18   Ht 6' (1.829 m)   Wt 83.5 kg   SpO2 98%   BMI 24.95 kg/m  Gen:   Awake, no distress   Resp:  Normal effort, ctab MSK:   Moves extremities without difficulty  Other:  No ttp of abd  Medical Decision Making  Medically screening exam initiated at 2:59 PM.  Appropriate orders placed.  [name] was informed that the remainder of the evaluation will be completed by another provider, this initial triage assessment does not replace that evaluation, and the importance of remaining in the ED until their evaluation is complete.  Labs, cxr, covid, [name], PA-C 11/03/20 1500    Regan Lemming, MD 11/03/20 1844

## 2020-11-03 NOTE — ED Provider Notes (Signed)
[facility] DEPT Provider Note   CSN: [id number] Arrival date & time: 11/03/20  1439     History Chief Complaint  Patient presents with   Fever    Alexander Saunders is a 70 y.o. male with history of hypertension and multiple myeloma presents to the ED for evaluation of fever, chills, productive cough, fatigue, and rhinorrhea since yesterday.  Patient reported T-max of 101.2 Fahrenheit orally today.  No anti-pyretic medication taken.  He denies any sore throat, shortness of breath, chest pain, nausea, vomiting, dysuria, hematuria, abdominal pain, or change in bowel habits.  Additionally, the patient mentions he has been having some urinary frequency, but has been increasing his water intake and this has been going on for "a while".  The patient is vaccinated for COVID.  The patient's wife is having similar symptoms.  He reports he has been around many family members and children lately.  Daily medication includes Revlimid which is a 21 day on 7 day off medication. The patient is on his next to last day before his week off. No known drug allergies.  Denies any tobacco, EtOH, or illicit drug use.   Fever Associated symptoms: chills and rhinorrhea   Associated symptoms: no chest pain, no congestion, no cough, no diarrhea, no dysuria, no ear pain, no nausea, no rash, no sore throat and no vomiting       Past Medical History:  Diagnosis Date   Anxiety    occasional    Blood transfusion without reported diagnosis 2018   with spinal surgery   History of chemotherapy    completed 06-17-2017   History of radiation therapy    completed 11-2016 per pt   Multiple myeloma ([place]) 2018   currently in remission    Neuromuscular disorder ([place])    neuropathy lower extremeties, primarily right leg    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   Stem cells transplant status ([place]) 06/2017    Patient Active Problem List   Diagnosis Date Noted   Drug-induced polyneuropathy ([place])  01/05/2018   H/O autologous stem cell transplant ([place]) 06/25/2017   Bone pain due to G-CSF 05/02/2017   Fall 05/02/2017   Abnormal EKG 04/23/2017   Pre-transplant evaluation for stem cell transplant 03/31/2017   Multiple myeloma ([place]) 09/18/2016   Lower extremity weakness 09/03/2016   Acute kidney injury ([place]) 09/03/2016   Urinary retention 09/03/2016   Leukocytosis 09/03/2016   Hypercalcemia 09/03/2016   Spinal cord lesion ([place]) 09/03/2016   Epidural mass 09/02/2016    Past Surgical History:  Procedure Laterality Date   APPENDECTOMY     BONE MARROW BIOPSY  07/2018   COLONOSCOPY     KNEE ARTHROSCOPY     r/knee   LAMINECTOMY N/A 09/04/2016   Procedure: THORACIC FOUR-SIX LAMINECTOMY FOR RESECTION OF TUMOR;  Surgeon: Ditty, Kevan Ny, MD;  Location: [facility];  Service: Neurosurgery;  Laterality: N/A;       Family History  Problem Relation Age of Onset   Hypertension Mother    Cancer Mother    Lung cancer Mother    Hypertension Father    Colon cancer Neg Hx    Colon polyps Neg Hx    Esophageal cancer Neg Hx    Rectal cancer Neg Hx    Stomach cancer Neg Hx     Social History   Tobacco Use   Smoking status: Never   Smokeless tobacco: Never  Vaping Use   Vaping Use: Never used  Substance Use Topics  Alcohol use: Yes    Comment: occ   Drug use: Never    Home Medications Prior to Admission medications   Medication Sig Start Date End Date Taking? Authorizing Provider  aspirin EC 81 MG tablet Take 81 mg by mouth daily. 01/29/17  Yes [provider]  B Complex Vitamins (B-COMPLEX/B-12) TABS Take 1 tablet by mouth daily.   Yes [provider]  bisacodyl (DULCOLAX) 5 MG EC tablet Take 5 mg by mouth daily as needed for moderate constipation.   Yes [provider]  carboxymethylcellulose (REFRESH PLUS) 0.5 % SOLN Place 1 drop into both eyes 3 (three) times daily as needed (dry eyes). 08/15/20  Yes [provider]  cholecalciferol  (VITAMIN D3) 25 MCG (1000 UNIT) tablet Take 1,000 Units by mouth daily.   Yes [provider]  cyclobenzaprine (FLEXERIL) 5 MG tablet Take 1 tablet (5 mg total) by mouth at bedtime as needed for muscle spasms. 02/13/20  Yes Patel, Donika K, DO  diphenhydramine-acetaminophen (TYLENOL PM EXTRA STRENGTH) 25-500 MG TABS tablet Take 1 tablet by mouth at bedtime as needed (pain).   Yes [provider]  gabapentin (NEURONTIN) 300 MG capsule TAKE 3 CAPSULE BY MOUTH THREE TIMES DAILY. 3 CAPSULE IN THE MORNING 3 CAPSULE IN THE AFTERNOON AND 3 CAPSULE AT NIGHT Patient taking differently: Take 900 mg by mouth 2 (two) times daily. 06/20/20  Yes Wyatt Portela, MD  lenalidomide (REVLIMID) 10 MG capsule TAKE 1 CAPSULE ONCE DAILY FOR 21 DAYS, THEN 7 DAYS OFF. REPEAT EVERY 28 DAYS Patient taking differently: Take 10 mg by mouth See admin instructions. TAKE 1 CAPSULE ONCE DAILY FOR 21 DAYS, THEN 7 DAYS OFF. REPEAT EVERY 28 DAYS 10/04/20  Yes Wyatt Portela, MD  losartan (COZAAR) 25 MG tablet Take 25 mg by mouth daily.   Yes [provider]  Magnesium 100 MG TABS Take 100 mg by mouth daily.   Yes [provider]  Multiple Vitamin (MULTIVITAMIN) capsule Take 1 capsule by mouth daily.   Yes [provider]  nirmatrelvir/ritonavir EUA (PAXLOVID) 20 x 150 MG & 10 x 100MG TABS Take 3 tablets by mouth 2 (two) times daily for 5 days. Patient GFR is >60. Take nirmatrelvir (150 mg) two tablets twice daily for 5 days and ritonavir (100 mg) one tablet twice daily for 5 days. 11/03/20 11/08/20 Yes Sherrell Puller, PA-C  sodium chloride (OCEAN) 0.65 % SOLN nasal spray Place 1 spray into both nostrils daily as needed for congestion.   Yes [provider]    Allergies    Patient has no known allergies.  Review of Systems   Review of Systems  Constitutional:  Positive for chills, fatigue and fever.  HENT:  Positive for rhinorrhea. Negative for congestion, ear pain and sore throat.    Eyes:  Negative for pain and visual disturbance.  Respiratory:  Negative for cough and shortness of breath.   Cardiovascular:  Negative for chest pain and palpitations.  Gastrointestinal:  Negative for abdominal pain, blood in stool, constipation, diarrhea, nausea and vomiting.  Genitourinary:  Positive for frequency. Negative for difficulty urinating, dysuria, flank pain, hematuria, penile discharge, penile pain, testicular pain and urgency.  Musculoskeletal:  Negative for arthralgias and back pain.  Skin:  Negative for color change and rash.  Neurological:  Negative for seizures, syncope and weakness.  All other systems reviewed and are negative.  Physical Exam Updated Vital Signs BP 102/61   Pulse 91   Temp 100.1 F (37.8  C) (Rectal)   Resp 18   Ht 6' (1.829 m)   Wt 83.5 kg   SpO2 100%   BMI 24.95 kg/m   Physical Exam Vitals and nursing note reviewed. Exam conducted with a chaperone present Alexander Saunders, Tech).  Constitutional:      General: He is not in acute distress.    Appearance: Normal appearance. He is not toxic-appearing.  HENT:     Head: Normocephalic and atraumatic.     Right Ear: Tympanic membrane and ear canal normal.     Left Ear: Tympanic membrane and ear canal normal.     Nose: Congestion and rhinorrhea present.     Comments: Erythematous and edematous bilateral nasal turbinates with scant clear nasal discharge.    Mouth/Throat:     Mouth: Mucous membranes are moist.     Pharynx: No oropharyngeal exudate or posterior oropharyngeal erythema.     Comments: Airway patent.  Uvula midline.  Moist mucous membranes. Eyes:     General: No scleral icterus.    Extraocular Movements: Extraocular movements intact.     Conjunctiva/sclera: Conjunctivae normal.  Cardiovascular:     Rate and Rhythm: Normal rate and regular rhythm.  Pulmonary:     Effort: Pulmonary effort is normal. No respiratory distress.     Breath sounds: Normal breath sounds. No wheezing or rales.      Comments: No respiratory distress.  Lungs clear to auscultation bilaterally.  Patient is speaking in full sentences with ease. Abdominal:     General: Abdomen is flat. Bowel sounds are normal.     Palpations: Abdomen is soft.     Tenderness: There is no abdominal tenderness. There is no guarding or rebound.  Musculoskeletal:        General: No deformity.     Cervical back: Normal range of motion and neck supple.  Lymphadenopathy:     Cervical: No cervical adenopathy.  Skin:    General: Skin is warm and dry.  Neurological:     General: No focal deficit present.     Mental Status: He is alert. Mental status is at baseline.     Motor: No weakness.    ED Results / Procedures / Treatments   Labs (all labs ordered are listed, but only abnormal results are displayed) Labs Reviewed  RESP PANEL BY RT-PCR (FLU A&B, COVID) ARPGX2 - Abnormal; Notable for the following components:      Result Value   SARS Coronavirus 2 by RT PCR POSITIVE (*)    All other components within normal limits  CBC WITH DIFFERENTIAL/PLATELET - Abnormal; Notable for the following components:   RBC 2.86 (*)    Hemoglobin 9.1 (*)    HCT 27.9 (*)    RDW 15.6 (*)    Monocytes Absolute 1.4 (*)    All other components within normal limits  COMPREHENSIVE METABOLIC PANEL - Abnormal; Notable for the following components:   Glucose, Bld 110 (*)    Total Bilirubin 1.5 (*)    All other components within normal limits  CULTURE, BLOOD (ROUTINE X 2)  CULTURE, BLOOD (ROUTINE X 2)  URINALYSIS, ROUTINE W REFLEX MICROSCOPIC  POC OCCULT BLOOD, ED    EKG None  Radiology DG Chest 2 View  Result Date: 11/03/2020 CLINICAL DATA:  Cough and fever for 2 days. EXAM: CHEST - 2 VIEW COMPARISON:  09/02/2016 FINDINGS: The heart size and mediastinal contours are within normal limits. Both lungs are clear. The visualized skeletal structures are unremarkable. IMPRESSION: No active cardiopulmonary disease.  Electronically Signed   By:  Nolon Nations M.D.   On: 11/03/2020 16:20    Procedures Procedures   Medications Ordered in ED Medications - No data to display  ED Course  I have reviewed the triage vital signs and the nursing notes.  Pertinent labs & imaging results that were available during my care of the patient were reviewed by me and considered in my medical decision making (see chart for details).  Alexander Saunders is a 70 year old male with multiple myeloma presenting for fever and URI symptoms.  Differential diagnosis includes but is not limited to pneumonia, pneumothorax, COVID, flu, sepsis.  I personally reviewed and interpreted the patient's labs and imaging.  CMP benign.  CBC shows mild anemia in comparison to previous labs 4 months ago.  Patient's hemoglobin from 11.9 dropped to 9.1 and hematocrit dropped from 35.3-27.9.  No leukocytosis.  Patient denies any dark or tarry stools.  Due to recent drop, Hemoccult obtained and was negative.  Rectal temp elevated in comparison to oral temp.  Rectal temp is 100.1 Fahrenheit.  Urinalysis negative.  CMP shows mildly elevated glucose at 110, otherwise electrolytes within normal limit.  GFR greater than 60.  Creatinine 1.00.  Patient COVID-positive.  Negative for flu A or B.  CXR shows no active cardiopulmonary process. My attending recommended blood cultures as well. Discussed with the family that someone will call if the results are positive.   Consulted pharmacy to see complications or possible interactions with patient's home meds.  It was decided that the patient will take his Revlimid tomorrow and start Paxlovid the following day due to the possible, but not clearly clarified, interaction.  Discussed with the patient to follow-up with his oncologist on Monday to make him aware of his positive COVID status and worsening anemia.  I discussed the lab and imaging findings with the patient.  Advised the patient to quarantine through day 5.  Work note given. Discussed the  Paxlovid regimen and additional information provided.  Strict return precautions given.  Patient and wife agreeable to plan.  Patient is stable and being discharged home to condition.  I discussed this case with my attending physician who cosigned this note including patient's presenting symptoms, physical exam, and planned diagnostics and interventions. Attending physician stated agreement with plan or made changes to plan which were implemented.   Attending physician assessed patient at bedside.   MDM Rules/Calculators/A&P  Final Clinical Impression(s) / ED Diagnoses Final diagnoses:  COVID    Rx / DC Orders ED Discharge Orders          Ordered    nirmatrelvir/ritonavir EUA (PAXLOVID) 20 x 150 MG & 10 x 100MG TABS  2 times daily        11/03/20 1908             Sherrell Puller, [place] 11/03/20 2349    Hayden Rasmussen, MD 11/04/20 [phone]

## 2020-11-03 NOTE — Discharge Instructions (Addendum)
You have been prescribed Paxlovid for symptom control of COVID.  Please take your Revlimid tomorrow as prescribed.  The day after, please start the Paxlovid.  Contact your oncologist on Monday to update on COVID-positive results and worsening anemia.  You may take Tylenol for fever and symptom control.  Stay well-hydrated.  You are free to go back to work on October 6 due to the 5-day quarantine period.  Work note included.  Additional education on COVID and Paxlovid attached to this discharge document.  If you have any concerns, new or worsening symptoms, please return to the emergency department.

## 2020-11-03 NOTE — ED Notes (Signed)
PA and primary RN aware Covid positive result.

## 2020-11-05 ENCOUNTER — Telehealth: Payer: Self-pay | Admitting: *Deleted

## 2020-11-05 NOTE — Telephone Encounter (Signed)
Pt called stating he tested positive over the weekend for covid. Awaiting phone call from pt. Left vm to explain covid policy for pt that are not on active treatment and office visit can also be scheduled as a phone visit. Awaiting callback

## 2020-11-07 ENCOUNTER — Inpatient Hospital Stay: Payer: TRICARE For Life (TFL) | Admitting: Oncology

## 2020-11-07 ENCOUNTER — Inpatient Hospital Stay: Payer: TRICARE For Life (TFL)

## 2020-11-09 LAB — CULTURE, BLOOD (ROUTINE X 2)
Culture: NO GROWTH
Culture: NO GROWTH
Special Requests: ADEQUATE
Special Requests: ADEQUATE

## 2020-11-30 ENCOUNTER — Inpatient Hospital Stay (HOSPITAL_BASED_OUTPATIENT_CLINIC_OR_DEPARTMENT_OTHER): Payer: Medicare Other | Admitting: Oncology

## 2020-11-30 ENCOUNTER — Other Ambulatory Visit: Payer: Self-pay

## 2020-11-30 ENCOUNTER — Inpatient Hospital Stay: Payer: Medicare Other | Attending: Oncology

## 2020-11-30 VITALS — BP 110/60 | HR 84 | Temp 97.7°F | Resp 18 | Wt 182.6 lb

## 2020-11-30 DIAGNOSIS — C9000 Multiple myeloma not having achieved remission: Secondary | ICD-10-CM | POA: Diagnosis not present

## 2020-11-30 DIAGNOSIS — R6881 Early satiety: Secondary | ICD-10-CM | POA: Diagnosis not present

## 2020-11-30 DIAGNOSIS — R634 Abnormal weight loss: Secondary | ICD-10-CM | POA: Insufficient documentation

## 2020-11-30 DIAGNOSIS — G629 Polyneuropathy, unspecified: Secondary | ICD-10-CM | POA: Insufficient documentation

## 2020-11-30 DIAGNOSIS — D649 Anemia, unspecified: Secondary | ICD-10-CM | POA: Diagnosis not present

## 2020-11-30 DIAGNOSIS — G959 Disease of spinal cord, unspecified: Secondary | ICD-10-CM

## 2020-11-30 DIAGNOSIS — C419 Malignant neoplasm of bone and articular cartilage, unspecified: Secondary | ICD-10-CM

## 2020-11-30 LAB — CBC WITH DIFFERENTIAL (CANCER CENTER ONLY)
Abs Immature Granulocytes: 0.05 10*3/uL (ref 0.00–0.07)
Basophils Absolute: 0 10*3/uL (ref 0.0–0.1)
Basophils Relative: 0 %
Eosinophils Absolute: 0.5 10*3/uL (ref 0.0–0.5)
Eosinophils Relative: 9 %
HCT: 24.3 % — ABNORMAL LOW (ref 39.0–52.0)
Hemoglobin: 8 g/dL — ABNORMAL LOW (ref 13.0–17.0)
Immature Granulocytes: 1 %
Lymphocytes Relative: 18 %
Lymphs Abs: 1 10*3/uL (ref 0.7–4.0)
MCH: 31.7 pg (ref 26.0–34.0)
MCHC: 32.9 g/dL (ref 30.0–36.0)
MCV: 96.4 fL (ref 80.0–100.0)
Monocytes Absolute: 0.8 10*3/uL (ref 0.1–1.0)
Monocytes Relative: 15 %
Neutro Abs: 3.1 10*3/uL (ref 1.7–7.7)
Neutrophils Relative %: 57 %
Platelet Count: 301 10*3/uL (ref 150–400)
RBC: 2.52 MIL/uL — ABNORMAL LOW (ref 4.22–5.81)
RDW: 16.1 % — ABNORMAL HIGH (ref 11.5–15.5)
WBC Count: 5.4 10*3/uL (ref 4.0–10.5)
nRBC: 0 % (ref 0.0–0.2)

## 2020-11-30 LAB — CMP (CANCER CENTER ONLY)
ALT: 27 U/L (ref 0–44)
AST: 16 U/L (ref 15–41)
Albumin: 3 g/dL — ABNORMAL LOW (ref 3.5–5.0)
Alkaline Phosphatase: 80 U/L (ref 38–126)
Anion gap: 8 (ref 5–15)
BUN: 15 mg/dL (ref 8–23)
CO2: 26 mmol/L (ref 22–32)
Calcium: 9.8 mg/dL (ref 8.9–10.3)
Chloride: 105 mmol/L (ref 98–111)
Creatinine: 0.91 mg/dL (ref 0.61–1.24)
GFR, Estimated: >60 mL/min (ref >60)
Glucose, Bld: 95 mg/dL (ref 70–99)
Potassium: 4.1 mmol/L (ref 3.5–5.1)
Sodium: 139 mmol/L (ref 135–145)
Total Bilirubin: 0.8 mg/dL (ref 0.3–1.2)
Total Protein: 7.2 g/dL (ref 6.5–8.1)

## 2020-11-30 NOTE — Progress Notes (Signed)
Hematology and Oncology Follow Up Visit  Alexander Saunders [id number] 07/29/1950 70 y.o. 11/30/2020 2:49 PM Alexander Saunders, MDOsei-Bonsu, George, MD   Principle Diagnosis: 70-year-old man with type light chain multiple myeloma diagnosed in 2018.  She was found to have epidural mass.    Prior Therapy:  He is status post surgical decompression of an epidural mass on 09/04/2016 and the pathology showed Saunders plasma cell neoplasm.  He is S/P adjuvant radiation therapy to the thoracic spine to be completed on 10/14/2016.  Velcade 1.5 mg/m weekly with dexamethasone 20 mg and Cytoxan 600 mg po. Therapy started on 10/24/2016.   High-dose chemotherapy followed by stem cell transplant: Melphalan 200 mg/m2 completed on 06/17/17 followed by autologous stem cell rescue 06/18/17.  He achieved complete response.   Zometa 4 mg every 3 months started in February 2020.  Last treatment given in January 2022.   Current therapy:     Revlimid 10 mg daily for 21 days out of Saunders 28-day started in February 2020.     Interim History:  Alexander Saunders presents today for Saunders follow-up visit.  Since the last visit, he was diagnosed with COVID-19 and has recovered at this time.  He reports feeling overall well without any major complaints.  He denies any chest pain or shortness of breath.  He denies any hematochezia or melena.  He was able to hike over 8 miles with some fatigue associated with it.  He has reported decrease in his appetite and weight loss and early satiety however.      Medications: Reviewed without changes. Current Outpatient Medications  Medication Sig Dispense Refill   aspirin EC 81 MG tablet Take 81 mg by mouth daily.     B Complex Vitamins (B-COMPLEX/B-12) TABS Take 1 tablet by mouth daily.     bisacodyl (DULCOLAX) 5 MG EC tablet Take 5 mg by mouth daily as needed for moderate constipation.     carboxymethylcellulose (REFRESH PLUS) 0.5 % SOLN Place 1 drop into both eyes 3 (three) times daily as  needed (dry eyes).     cholecalciferol (VITAMIN D3) 25 MCG (1000 UNIT) tablet Take 1,000 Units by mouth daily.     cyclobenzaprine (FLEXERIL) 5 MG tablet Take 1 tablet (5 mg total) by mouth at bedtime as needed for muscle spasms. 30 tablet 1   diphenhydramine-acetaminophen (TYLENOL PM EXTRA STRENGTH) 25-500 MG TABS tablet Take 1 tablet by mouth at bedtime as needed (pain).     gabapentin (NEURONTIN) 300 MG capsule TAKE 3 CAPSULE BY MOUTH THREE TIMES DAILY. 3 CAPSULE IN THE MORNING 3 CAPSULE IN THE AFTERNOON AND 3 CAPSULE AT NIGHT (Patient taking differently: Take 900 mg by mouth 2 (two) times daily.) 270 capsule 3   lenalidomide (REVLIMID) 10 MG capsule Take 1 capsule (10 mg total) by mouth See admin instructions. TAKE 1 CAPSULE ONCE DAILY FOR 21 DAYS, THEN 7 DAYS OFF. REPEAT EVERY 28 DAYS 21 capsule 0   losartan (COZAAR) 25 MG tablet Take 25 mg by mouth daily.     Magnesium 100 MG TABS Take 100 mg by mouth daily.     Multiple Vitamin (MULTIVITAMIN) capsule Take 1 capsule by mouth daily.     sodium chloride (OCEAN) 0.65 % SOLN nasal spray Place 1 spray into both nostrils daily as needed for congestion.     No current facility-administered medications for this visit.     Allergies: No Known Allergies    Physical Exam:      Blood pressure 110/60, pulse   84, temperature 97.7 F (36.5 C), temperature source Tympanic, resp. rate 18, weight 182 lb 9 oz (82.8 kg), SpO2 100 %.       ECOG: 1   General appearance: Comfortable appearing without any discomfort Head: Normocephalic without any trauma Oropharynx: Mucous membranes are moist and pink without any thrush or ulcers. Eyes: Pupils are equal and round reactive to light. Lymph nodes: No cervical, supraclavicular, inguinal or axillary lymphadenopathy.   Heart:regular rate and rhythm.  S1 and S2 without leg edema. Lung: Clear without any rhonchi or wheezes.  No dullness to percussion. Abdomin: Soft, nontender, nondistended with good  bowel sounds.  No hepatosplenomegaly. Musculoskeletal: No joint deformity or effusion.  Full range of motion noted. Neurological: No deficits noted on motor, sensory and deep tendon reflex exam. Skin: No petechial rash or dryness.  Appeared moist.             Lab Results: Lab Results  Component Value Date   WBC 7.1 11/03/2020   HGB 9.1 (L) 11/03/2020   HCT 27.9 (L) 11/03/2020   MCV 97.6 11/03/2020   PLT 320 11/03/2020     Chemistry      Component Value Date/Time   NA 135 11/03/2020 1516   NA 137 02/06/2017 0828   K 3.8 11/03/2020 1516   K 4.0 02/06/2017 0828   CL 102 11/03/2020 1516   CO2 22 11/03/2020 1516   CO2 24 02/06/2017 0828   BUN 12 11/03/2020 1516   BUN 14.9 02/06/2017 0828   CREATININE 1.00 11/03/2020 1516   CREATININE 0.91 06/20/2020 0802   CREATININE 0.9 02/06/2017 0828      Component Value Date/Time   CALCIUM 9.2 11/03/2020 1516   CALCIUM 9.1 02/06/2017 0828   ALKPHOS 68 11/03/2020 1516   ALKPHOS 31 (L) 02/06/2017 0828   AST 35 11/03/2020 1516   AST 12 (L) 06/20/2020 0802   AST 32 02/06/2017 0828   ALT 42 11/03/2020 1516   ALT 7 06/20/2020 0802   ALT 43 02/06/2017 0828   BILITOT 1.5 (H) 11/03/2020 1516   BILITOT 0.7 06/20/2020 0802   BILITOT 0.42 02/06/2017 0828       Results for Alexander Saunders (MRN [id number]) as of 11/30/2020 14:51  Ref. Range 02/16/2020 08:59 06/20/2020 08:02  Kappa free light chain Latest Ref Range: 3.3 - 19.4 mg/L 39.5 (H) 45.5 (H)  Lambda free light chains Latest Ref Range: 5.7 - 26.3 mg/L 29.5 (H) 20.3  Kappa, lambda light chain ratio Latest Ref Range: 0.26 - 1.65  1.34 2.24 (H)    Impression and Plan:  69-year-old man with:   1.  Kappa light chain multiple myeloma diagnosed with epidural compression in 2018.     His disease status was updated at this time and treatment choices were reviewed.  He continues to be on Revlimid maintenance which I recommended continuing indefinitely.  Risks and benefits of  continuing this medication long-term were discussed.  Complications that include cytopenia, skin rash as well as GI toxicity were reiterated.  After discussion he is agreeable to continue   2.  Bone health: He has completed 2 years of Zometa and currently off treatment.  Will reconsider starting if he developed worsening disease.   3.  Neuropathy: Related to previous epidural involvement and appears to be stable at this time.  4.  Weight loss and early satiety: The signs are concerning coupled with his anemia about other malignancy.  I recommended that he follows up with GI for Saunders possible   endoscopy.  I will arrange for Saunders PET scan in the near future to investigate for possible multiple myeloma recurrence or other malignancy.  5.  Anemia: Unclear etiology at this time.  Could be sign of disease progression and we will update his myeloma panel today and repeat bone marrow biopsy may be needed if myeloma recurrence is suspected.  6. Follow-up: To be determined pending his work-up.  30 minutes were spent on this encounter.  The time was dedicated to reviewing laboratory data, disease status update and outlining future plan of care.   Firas Shadad, MD 10/28/20222:49 PM 

## 2020-12-03 LAB — KAPPA/LAMBDA LIGHT CHAINS
Kappa free light chain: 99.1 mg/L — ABNORMAL HIGH (ref 3.3–19.4)
Kappa, lambda light chain ratio: 4.63 — ABNORMAL HIGH (ref 0.26–1.65)
Lambda free light chains: 21.4 mg/L (ref 5.7–26.3)

## 2020-12-04 ENCOUNTER — Encounter: Payer: Self-pay | Admitting: Oncology

## 2020-12-04 LAB — MULTIPLE MYELOMA PANEL, SERUM
Albumin SerPl Elph-Mcnc: 3 g/dL (ref 2.9–4.4)
Albumin/Glob SerPl: 1 (ref 0.7–1.7)
Alpha 1: 0.4 g/dL (ref 0.0–0.4)
Alpha2 Glob SerPl Elph-Mcnc: 0.8 g/dL (ref 0.4–1.0)
B-Globulin SerPl Elph-Mcnc: 0.9 g/dL (ref 0.7–1.3)
Gamma Glob SerPl Elph-Mcnc: 1.1 g/dL (ref 0.4–1.8)
Globulin, Total: 3.2 g/dL (ref 2.2–3.9)
IgA: 95 mg/dL (ref 61–437)
IgG (Immunoglobin G), Serum: 1180 mg/dL (ref 603–1613)
IgM (Immunoglobulin M), Srm: 12 mg/dL — ABNORMAL LOW (ref 15–143)
Total Protein ELP: 6.2 g/dL (ref 6.0–8.5)

## 2020-12-11 ENCOUNTER — Telehealth: Payer: Self-pay | Admitting: Oncology

## 2020-12-11 NOTE — Telephone Encounter (Signed)
Sch per 1/11 inbasket, pt aware °

## 2020-12-13 ENCOUNTER — Ambulatory Visit (HOSPITAL_COMMUNITY): Payer: TRICARE For Life (TFL)

## 2020-12-14 ENCOUNTER — Other Ambulatory Visit: Payer: Self-pay | Admitting: Oncology

## 2020-12-14 DIAGNOSIS — C9000 Multiple myeloma not having achieved remission: Secondary | ICD-10-CM

## 2020-12-18 ENCOUNTER — Other Ambulatory Visit (HOSPITAL_COMMUNITY): Payer: TRICARE For Life (TFL)

## 2020-12-24 ENCOUNTER — Ambulatory Visit: Payer: TRICARE For Life (TFL) | Admitting: Oncology

## 2020-12-26 ENCOUNTER — Ambulatory Visit (HOSPITAL_COMMUNITY)
Admission: RE | Admit: 2020-12-26 | Discharge: 2020-12-26 | Disposition: A | Discharge status: Home / Self Care | Payer: Medicare Other | Source: Ambulatory Visit | Attending: Oncology | Admitting: Oncology

## 2020-12-26 ENCOUNTER — Other Ambulatory Visit: Payer: Self-pay

## 2020-12-26 DIAGNOSIS — C419 Malignant neoplasm of bone and articular cartilage, unspecified: Secondary | ICD-10-CM | POA: Insufficient documentation

## 2020-12-26 DIAGNOSIS — N281 Cyst of kidney, acquired: Secondary | ICD-10-CM | POA: Diagnosis not present

## 2020-12-26 DIAGNOSIS — C9000 Multiple myeloma not having achieved remission: Secondary | ICD-10-CM | POA: Insufficient documentation

## 2020-12-26 DIAGNOSIS — C269 Malignant neoplasm of ill-defined sites within the digestive system: Secondary | ICD-10-CM | POA: Diagnosis not present

## 2020-12-26 DIAGNOSIS — I2510 Atherosclerotic heart disease of native coronary artery without angina pectoris: Secondary | ICD-10-CM | POA: Diagnosis not present

## 2020-12-26 DIAGNOSIS — I700 Atherosclerosis of aorta: Secondary | ICD-10-CM | POA: Diagnosis not present

## 2020-12-26 DIAGNOSIS — G959 Disease of spinal cord, unspecified: Secondary | ICD-10-CM | POA: Insufficient documentation

## 2020-12-26 LAB — GLUCOSE, CAPILLARY: Glucose-Capillary: 96 mg/dL (ref 70–99)

## 2020-12-26 MED ORDER — FLUDEOXYGLUCOSE F - 18 (FDG) INJECTION
9.1000 | Freq: Once | INTRAVENOUS | Status: AC | PRN
  Administered 2020-12-26: 9 via INTRAVENOUS

## 2021-01-07 ENCOUNTER — Other Ambulatory Visit: Payer: Self-pay

## 2021-01-07 ENCOUNTER — Inpatient Hospital Stay: Payer: Medicare Other | Attending: Oncology | Admitting: Oncology

## 2021-01-07 ENCOUNTER — Inpatient Hospital Stay: Payer: Medicare Other

## 2021-01-07 VITALS — BP 117/57 | HR 88 | Temp 97.9°F | Resp 15 | Ht 72.0 in | Wt 185.5 lb

## 2021-01-07 DIAGNOSIS — C9000 Multiple myeloma not having achieved remission: Secondary | ICD-10-CM | POA: Insufficient documentation

## 2021-01-07 DIAGNOSIS — G629 Polyneuropathy, unspecified: Secondary | ICD-10-CM | POA: Diagnosis not present

## 2021-01-07 DIAGNOSIS — D649 Anemia, unspecified: Secondary | ICD-10-CM | POA: Diagnosis not present

## 2021-01-07 DIAGNOSIS — R634 Abnormal weight loss: Secondary | ICD-10-CM | POA: Insufficient documentation

## 2021-01-07 DIAGNOSIS — E538 Deficiency of other specified B group vitamins: Secondary | ICD-10-CM

## 2021-01-07 DIAGNOSIS — R6881 Early satiety: Secondary | ICD-10-CM | POA: Insufficient documentation

## 2021-01-07 DIAGNOSIS — C419 Malignant neoplasm of bone and articular cartilage, unspecified: Secondary | ICD-10-CM

## 2021-01-07 LAB — CBC WITH DIFFERENTIAL (CANCER CENTER ONLY)
Abs Immature Granulocytes: 0.11 10*3/uL — ABNORMAL HIGH (ref 0.00–0.07)
Basophils Absolute: 0 10*3/uL (ref 0.0–0.1)
Basophils Relative: 0 %
Eosinophils Absolute: 0.2 10*3/uL (ref 0.0–0.5)
Eosinophils Relative: 3 %
HCT: 25.9 % — ABNORMAL LOW (ref 39.0–52.0)
Hemoglobin: 8.2 g/dL — ABNORMAL LOW (ref 13.0–17.0)
Immature Granulocytes: 2 %
Lymphocytes Relative: 14 %
Lymphs Abs: 1 10*3/uL (ref 0.7–4.0)
MCH: 31.9 pg (ref 26.0–34.0)
MCHC: 31.7 g/dL (ref 30.0–36.0)
MCV: 100.8 fL — ABNORMAL HIGH (ref 80.0–100.0)
Monocytes Absolute: 0.7 10*3/uL (ref 0.1–1.0)
Monocytes Relative: 10 %
Neutro Abs: 5 10*3/uL (ref 1.7–7.7)
Neutrophils Relative %: 71 %
Platelet Count: 261 10*3/uL (ref 150–400)
RBC: 2.57 MIL/uL — ABNORMAL LOW (ref 4.22–5.81)
RDW: 17.8 % — ABNORMAL HIGH (ref 11.5–15.5)
WBC Count: 7 10*3/uL (ref 4.0–10.5)
nRBC: 0 % (ref 0.0–0.2)

## 2021-01-07 LAB — IRON AND TIBC
Iron: 78 ug/dL (ref 42–163)
Saturation Ratios: 25 % (ref 20–55)
TIBC: 313 ug/dL (ref 202–409)
UIBC: 235 ug/dL (ref 117–376)

## 2021-01-07 LAB — CMP (CANCER CENTER ONLY)
ALT: 12 U/L (ref 0–44)
AST: 13 U/L — ABNORMAL LOW (ref 15–41)
Albumin: 3.6 g/dL (ref 3.5–5.0)
Alkaline Phosphatase: 51 U/L (ref 38–126)
Anion gap: 9 (ref 5–15)
BUN: 19 mg/dL (ref 8–23)
CO2: 25 mmol/L (ref 22–32)
Calcium: 10 mg/dL (ref 8.9–10.3)
Chloride: 107 mmol/L (ref 98–111)
Creatinine: 1.02 mg/dL (ref 0.61–1.24)
GFR, Estimated: >60 mL/min (ref >60)
Glucose, Bld: 110 mg/dL — ABNORMAL HIGH (ref 70–99)
Potassium: 3.8 mmol/L (ref 3.5–5.1)
Sodium: 141 mmol/L (ref 135–145)
Total Bilirubin: 0.5 mg/dL (ref 0.3–1.2)
Total Protein: 7.4 g/dL (ref 6.5–8.1)

## 2021-01-07 LAB — VITAMIN B12: Vitamin B-12: 311 pg/mL (ref 180–914)

## 2021-01-07 LAB — FERRITIN: Ferritin: 442 ng/mL — ABNORMAL HIGH (ref 24–336)

## 2021-01-07 NOTE — Progress Notes (Signed)
Hematology and Oncology Follow Up Visit  Alexander Saunders [id number] Jun 30, 1950 70 y.o. 01/07/2021 11:24 AM Saunders, Alexander Saunders MDOsei-Bonsu, Alexander Beard, MD   Principle Diagnosis: 70 year old man with multiple myeloma diagnosed in 2018.  He presented with epidural mass and kappa light chain multiple myeloma.  Prior Therapy:  He is status post surgical decompression of an epidural mass on 09/04/2016 and the pathology showed a plasma cell neoplasm.  He is S/P adjuvant radiation therapy to the thoracic spine to be completed on 10/14/2016.  Velcade 1.5 mg/m weekly with dexamethasone 20 mg and Cytoxan 600 mg po. Therapy started on 10/24/2016.   High-dose chemotherapy followed by stem cell transplant: Melphalan 200 mg/m2 completed on 06/17/17 followed by autologous stem cell rescue 06/18/17.  He achieved complete response.   Zometa 4 mg every 3 months started in February 2020.  Last treatment given in January 2022.   Revlimid 10 mg daily for 21 days out of a 28-day started in February 2020.  Therapy discontinued in November 2022 because of worsening anemia.  Current therapy: Under consideration for additional therapy.         Interim History:  [name] returns today for a follow-up visit.  Since last visit, he reports no major changes in his health.  He denies any excessive fatigue, tiredness or weakness.  He denies any hematochezia, melena or hemoptysis.  He denies any hospitalization or illnesses.  He denies any bone pain or pathological fractures.      Medications: Reviewed without changes. Current Outpatient Medications  Medication Sig Dispense Refill   aspirin EC 81 MG tablet Take 81 mg by mouth daily.     B Complex Vitamins (B-COMPLEX/B-12) TABS Take 1 tablet by mouth daily.     bisacodyl (DULCOLAX) 5 MG EC tablet Take 5 mg by mouth daily as needed for moderate constipation.     carboxymethylcellulose (REFRESH PLUS) 0.5 % SOLN Place 1 drop into both eyes 3 (three) times daily  as needed (dry eyes).     cholecalciferol (VITAMIN D3) 25 MCG (1000 UNIT) tablet Take 1,000 Units by mouth daily.     cyclobenzaprine (FLEXERIL) 5 MG tablet Take 1 tablet (5 mg total) by mouth at bedtime as needed for muscle spasms. 30 tablet 1   diphenhydramine-acetaminophen (TYLENOL PM EXTRA STRENGTH) 25-500 MG TABS tablet Take 1 tablet by mouth at bedtime as needed (pain).     gabapentin (NEURONTIN) 300 MG capsule TAKE 3 CAPSULE BY MOUTH THREE TIMES DAILY. 3 CAPSULE IN THE MORNING 3 CAPSULE IN THE AFTERNOON AND 3 CAPSULE AT NIGHT (Patient taking differently: Take 900 mg by mouth 2 (two) times daily.) 270 capsule 3   losartan (COZAAR) 25 MG tablet Take 25 mg by mouth daily.     Magnesium 100 MG TABS Take 100 mg by mouth daily.     Multiple Vitamin (MULTIVITAMIN) capsule Take 1 capsule by mouth daily.     REVLIMID 10 MG capsule TAKE 1 CAPSULE ONCE DAILY FOR 21 DAYS, THEN 7 DAYS OFF. REPEAT EVERY 28 DAYS 21 capsule 0   sodium chloride (OCEAN) 0.65 % SOLN nasal spray Place 1 spray into both nostrils daily as needed for congestion.     No current facility-administered medications for this visit.     Allergies: No Known Allergies    Physical Exam:      Blood pressure (!) 117/57, pulse 88, temperature 97.9 F (36.6 C), temperature source Temporal, resp. rate 15, height 6' (1.829 m), weight 185 lb 8 oz (84.1 kg), SpO2  99 %.       ECOG: 1    General appearance: Alert, awake without any distress. Head: Atraumatic without abnormalities Oropharynx: Without any thrush or ulcers. Eyes: No scleral icterus. Lymph nodes: No lymphadenopathy noted in the cervical, supraclavicular, or axillary nodes Heart:regular rate and rhythm, without any murmurs or gallops.   Lung: Clear to auscultation without any rhonchi, wheezes or dullness to percussion. Abdomin: Soft, nontender without any shifting dullness or ascites. Musculoskeletal: No clubbing or cyanosis. Neurological: No motor or sensory  deficits. Skin: No rashes or lesions.            Lab Results: Lab Results  Component Value Date   WBC 5.4 11/30/2020   HGB 8.0 (L) 11/30/2020   HCT 24.3 (L) 11/30/2020   MCV 96.4 11/30/2020   PLT 301 11/30/2020     Chemistry      Component Value Date/Time   NA 139 11/30/2020 1507   NA 137 02/06/2017 0828   K 4.1 11/30/2020 1507   K 4.0 02/06/2017 0828   CL 105 11/30/2020 1507   CO2 26 11/30/2020 1507   CO2 24 02/06/2017 0828   BUN 15 11/30/2020 1507   BUN 14.9 02/06/2017 0828   CREATININE 0.91 11/30/2020 1507   CREATININE 0.9 02/06/2017 0828      Component Value Date/Time   CALCIUM 9.8 11/30/2020 1507   CALCIUM 9.1 02/06/2017 0828   ALKPHOS 80 11/30/2020 1507   ALKPHOS 31 (L) 02/06/2017 0828   AST 16 11/30/2020 1507   AST 32 02/06/2017 0828   ALT 27 11/30/2020 1507   ALT 43 02/06/2017 0828   BILITOT 0.8 11/30/2020 1507   BILITOT 0.42 02/06/2017 0828        Latest Reference Range & Units 06/20/20 08:02 11/30/20 15:07  Kappa free light chain 3.3 - 19.4 mg/L 45.5 (H) 99.1 (H)  Lambda free light chains 5.7 - 26.3 mg/L 20.3 21.4  Kappa, lambda light chain ratio 0.26 - 1.65  2.24 (H) 4.63 (H)  (H): Data is abnormally high   Impression and Plan:  70 year old man with:   1.  Multiple myeloma diagnosed in 2018.  He was found to have Kappa light chain with epidural compression.   PET scan obtained on 12/26/2020 as well as laboratory data were reviewed.  He has no clear evidence of new bone lesions but a continuous rise in kappa light chain was noted.  The ratio has also increased up to 4.63.  The plan is to update his staging with a bone marrow biopsy in the near future to determine best course of action.  Different salvage therapy regimen utilizing daratumumab based therapy would be recommended if he has disease progression.   2.  Bone health: No evidence of any bone lesions noted.  Zometa will be withheld for the time being.   3.  Neuropathy: No recent  exacerbation at this time.  His neuropathy is related to previous multiple myeloma diagnosis and treatment.  4.  Weight loss and early satiety: His weight is stable and slightly improved.  Pet imaging did not show any evidence of malignancy.  5.  Anemia: His work-up is currently ongoing including B12, iron levels and a repeat bone marrow biopsy.  At this time he is asymptomatic.  6. Follow-up: We will be in the next few weeks to follow his progress.  30 minutes were spent on this encounter.  The time was dedicated to reviewing laboratory data, disease status update and outlining future plan of care.  Zola Button, MD 12/5/202211:24 AM

## 2021-01-08 LAB — KAPPA/LAMBDA LIGHT CHAINS
Kappa free light chain: 127.6 mg/L — ABNORMAL HIGH (ref 3.3–19.4)
Kappa, lambda light chain ratio: 9.18 — ABNORMAL HIGH (ref 0.26–1.65)
Lambda free light chains: 13.9 mg/L (ref 5.7–26.3)

## 2021-01-09 ENCOUNTER — Telehealth: Payer: Self-pay | Admitting: *Deleted

## 2021-01-09 LAB — ERYTHROPOIETIN: Erythropoietin: 47.4 m[IU]/mL — ABNORMAL HIGH (ref 2.6–18.5)

## 2021-01-09 NOTE — Telephone Encounter (Signed)
PC to patient, no answer, left VM - informed him of Dr. Hazeline Junker message below.  Instructed patient to call this office with any questions/concerns, phone number given.

## 2021-01-09 NOTE — Telephone Encounter (Signed)
-----   Message from Wyatt Portela, MD sent at 01/09/2021  8:58 AM EST ----- Please let him know he does not need any iron or vitamin replacement.  His iron level and B12 are normal.

## 2021-01-10 ENCOUNTER — Encounter: Payer: Self-pay | Admitting: Neurology

## 2021-01-10 LAB — MULTIPLE MYELOMA PANEL, SERUM
Albumin SerPl Elph-Mcnc: 3.6 g/dL (ref 2.9–4.4)
Albumin/Glob SerPl: 1.2 (ref 0.7–1.7)
Alpha 1: 0.3 g/dL (ref 0.0–0.4)
Alpha2 Glob SerPl Elph-Mcnc: 0.9 g/dL (ref 0.4–1.0)
B-Globulin SerPl Elph-Mcnc: 0.9 g/dL (ref 0.7–1.3)
Gamma Glob SerPl Elph-Mcnc: 1 g/dL (ref 0.4–1.8)
Globulin, Total: 3.1 g/dL (ref 2.2–3.9)
IgA: 62 mg/dL (ref 61–437)
IgG (Immunoglobin G), Serum: 1220 mg/dL (ref 603–1613)
IgM (Immunoglobulin M), Srm: 9 mg/dL — ABNORMAL LOW (ref 15–143)
Total Protein ELP: 6.7 g/dL (ref 6.0–8.5)

## 2021-01-15 ENCOUNTER — Encounter: Payer: Self-pay | Admitting: Neurology

## 2021-01-23 ENCOUNTER — Other Ambulatory Visit: Payer: Self-pay | Admitting: Radiology

## 2021-01-23 NOTE — H&P (Deleted)
Chief Complaint: Patient was seen in consultation today for bone marrow biopsy with aspiration    Referring Physician(s): Wyatt Portela  Supervising Physician: Ruthann Cancer  Patient Status: [facility] - Out-pt  History of Present Illness: Alexander Saunders is a 70 y.o. male with a medical history significant for multiple myeloma, diagnosed in 2018. He is status post surgical decompression of an epidural mass, adjuvant radiation therapy, chemotherapy and stem cell transplant. Recent imaging was negative for new bone lesions but lab work shows a continuous rise in kappa light chains.   The Oncology team would like to update his staging and has requested for Interventional Radiology to evaluate this patient for an image-guided bone marrow biopsy with aspiration.   Past Medical History:  Diagnosis Date   Anxiety    occasional    Blood transfusion without reported diagnosis 2018   with spinal surgery   History of chemotherapy    completed 06-17-2017   History of radiation therapy    completed 11-2016 per pt   Multiple myeloma ([place]) 2018   currently in remission    Neuromuscular disorder ([place])    neuropathy lower extremeties, primarily right leg    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   Stem cells transplant status ([place]) 06/2017    Past Surgical History:  Procedure Laterality Date   APPENDECTOMY     BONE MARROW BIOPSY  07/2018   COLONOSCOPY     KNEE ARTHROSCOPY     r/knee   LAMINECTOMY N/A 09/04/2016   Procedure: THORACIC FOUR-SIX LAMINECTOMY FOR RESECTION OF TUMOR;  Surgeon: Ditty, Kevan Ny, MD;  Location: [facility];  Service: Neurosurgery;  Laterality: N/A;    Allergies: Patient has no known allergies.  Medications: Prior to Admission medications   Medication Sig Start Date End Date Taking? Authorizing Provider  aspirin EC 81 MG tablet Take 81 mg by mouth daily. 01/29/17   [provider]  B Complex Vitamins (B-COMPLEX/B-12) TABS Take 1 tablet by mouth daily.     [provider]  bisacodyl (DULCOLAX) 5 MG EC tablet Take 5 mg by mouth daily as needed for moderate constipation.    [provider]  carboxymethylcellulose (REFRESH PLUS) 0.5 % SOLN Place 1 drop into both eyes 3 (three) times daily as needed (dry eyes). 08/15/20   [provider]  cholecalciferol (VITAMIN D3) 25 MCG (1000 UNIT) tablet Take 1,000 Units by mouth daily.    [provider]  cyclobenzaprine (FLEXERIL) 5 MG tablet Take 1 tablet (5 mg total) by mouth at bedtime as needed for muscle spasms. 02/13/20   Patel, Donika K, DO  diphenhydramine-acetaminophen (TYLENOL PM EXTRA STRENGTH) 25-500 MG TABS tablet Take 1 tablet by mouth at bedtime as needed (pain).    [provider]  gabapentin (NEURONTIN) 300 MG capsule TAKE 3 CAPSULE BY MOUTH THREE TIMES DAILY. 3 CAPSULE IN THE MORNING 3 CAPSULE IN THE AFTERNOON AND 3 CAPSULE AT NIGHT Patient taking differently: Take 900 mg by mouth 2 (two) times daily. 06/20/20   Wyatt Portela, MD  losartan (COZAAR) 25 MG tablet Take 25 mg by mouth daily.    [provider]  Magnesium 100 MG TABS Take 100 mg by mouth daily.    [provider]  Multiple Vitamin (MULTIVITAMIN) capsule Take 1 capsule by mouth daily.    [provider]  REVLIMID 10 MG capsule TAKE 1 CAPSULE ONCE DAILY FOR 21 DAYS, THEN 7 DAYS OFF. REPEAT EVERY 28 DAYS 12/14/20   Wyatt Portela, MD  sodium chloride (OCEAN) 0.65 % SOLN nasal spray Place 1 spray into both nostrils daily as needed for congestion.    [provider]     Family History  Problem Relation Age of Onset   Hypertension Mother    Cancer Mother    Lung cancer Mother    Hypertension Father    Colon cancer Neg Hx    Colon polyps Neg Hx    Esophageal cancer Neg Hx    Rectal cancer Neg Hx    Stomach cancer Neg Hx     Social History   Socioeconomic History   Marital status: Married    Spouse name: Not on file   Number of children: Not on  file   Years of education: Not on file   Highest education level: Not on file  Occupational History   Not on file  Tobacco Use   Smoking status: Never   Smokeless tobacco: Never  Vaping Use   Vaping Use: Never used  Substance and Sexual Activity   Alcohol use: Yes    Comment: occ   Drug use: Never   Sexual activity: Not on file  Other Topics Concern   Not on file  Social History Narrative   He worked as a Paramedic level of education:  college   He is back in college at [employer] studying exercise physiology.       Right handed      Single story home   Social Determinants of Health   Financial Resource Strain: Not on file  Food Insecurity: Not on file  Transportation Needs: Not on file  Physical Activity: Not on file  Stress: Not on file  Social Connections: Not on file    Review of Systems: A 12 point ROS discussed and pertinent positives are indicated in the HPI above.  All other systems are negative.  Review of Systems  Vital Signs: There were no vitals taken for this visit.  Physical Exam  Imaging: NM PET Image Restag (PS) Skull Base To Thigh  Result Date: 12/28/2020 CLINICAL DATA:  Initial treatment strategy for gastrointestinal cancer. History of multiple myeloma. EXAM: NUCLEAR MEDICINE PET SKULL BASE TO THIGH TECHNIQUE: 9.0 mCi F-18 FDG was injected intravenously. Full-ring PET imaging was performed from the skull base to thigh after the radiotracer. CT data was obtained and used for attenuation correction and anatomic localization. Fasting blood glucose: 96 mg/dl COMPARISON:  CT chest, abdomen and pelvis dated August 2nd 2018 FINDINGS: Mediastinal blood pool activity: SUV max 2.3 Liver activity: SUV max 3.2 NECK: No hypermetabolic lymph nodes in the neck. Incidental CT findings: none CHEST: No hypermetabolic mediastinal or hilar nodes. No suspicious pulmonary nodules on the CT scan. Incidental CT findings: Coronary artery calcifications of the LAD  and circumflex. Mild atherosclerotic disease of the thoracic aorta. ABDOMEN/PELVIS: No abnormal hypermetabolic activity within the liver, pancreas, adrenal glands, or spleen. No hypermetabolic lymph nodes in the abdomen or pelvis. Incidental CT findings: Simple cyst of the left kidney. Mild atherosclerotic disease of the abdominal aorta. SKELETON: Numerous osseous lytic lesions are similar in appearance when compared with 2018 prior. No focal FDG uptake above background marrow activity. Incidental CT findings: Prior multilevel laminectomy of the thoracic spine. IMPRESSION: 1. No evidence of FDG avid nodal or visceral metastatic disease in the chest, abdomen or pelvis. 2. Numerous osseous lytic lesions are similar in appearance when compared with 2018 prior. No focal FDG uptake above background marrow activity. Findings are compatible with  history of multiple myeloma. 3. Coronary artery calcifications of the LAD and circumflex. 4. Mild aortic Atherosclerosis (ICD10-I70.0). Electronically Signed   By: Yetta Glassman M.D.   On: 12/28/2020 12:34    Labs:  CBC: Recent Labs    06/20/20 0802 11/03/20 1516 11/30/20 1507 01/07/21 1117  WBC 5.2 7.1 5.4 7.0  HGB 11.9* 9.1* 8.0* 8.2*  HCT 35.3* 27.9* 24.3* 25.9*  PLT 292 320 301 261    COAGS: No results for input(s): INR, APTT in the last 8760 hours.  BMP: Recent Labs    06/20/20 0802 11/03/20 1516 11/30/20 1507 01/07/21 1117  NA 142 135 139 141  K 3.8 3.8 4.1 3.8  CL 107 102 105 107  CO2 $Re'27 22 26 25  'etA$ GLUCOSE 108* 110* 95 110*  BUN $Re'12 12 15 19  'wLq$ CALCIUM 9.5 9.2 9.8 10.0  CREATININE 0.91 1.00 0.91 1.02  GFRNONAA >60 >60 >60 >60    LIVER FUNCTION TESTS: Recent Labs    06/20/20 0802 11/03/20 1516 11/30/20 1507 01/07/21 1117  BILITOT 0.7 1.5* 0.8 0.5  AST 12* 35 16 13*  ALT 7 42 27 12  ALKPHOS 32* 68 80 51  PROT 7.2 7.5 7.2 7.4  ALBUMIN 3.6 3.6 3.0* 3.6    TUMOR MARKERS: No results for input(s): AFPTM, CEA, CA199, CHROMGRNA in  the last 8760 hours.  Assessment and Plan:  History of multiple myeloma; increased kappa light chains; restaging requested: Alexander Saunders, 70 year old male, presents today to the [facility] department for an image-guided bone marrow biopsy with aspiration.  Risks and benefits of this procedure were discussed with the patient and/or patient's family including, but not limited to bleeding, infection, damage to adjacent structures or low yield requiring additional tests.  All of the questions were answered and there is agreement to proceed. He has been NPO.   Consent signed and in chart.  Thank you for this interesting consult.  I greatly enjoyed meeting JAICEON COLLISTER and look forward to participating in their care.  A copy of this report was sent to the requesting provider on this date.  Electronically Signed: Soyla Dryer, AGACNP-BC [phone] 01/23/2021, 3:32 PM   I spent a total of  30 Minutes   in face to face in clinical consultation, greater than 50% of which was counseling/coordinating care for bone marrow biopsy with aspiration.

## 2021-01-24 ENCOUNTER — Other Ambulatory Visit: Payer: Self-pay | Admitting: Oncology

## 2021-01-24 ENCOUNTER — Ambulatory Visit (HOSPITAL_COMMUNITY)
Admission: RE | Admit: 2021-01-24 | Discharge: 2021-01-24 | Disposition: A | Discharge status: Home / Self Care | Payer: Medicare Other | Source: Ambulatory Visit | Attending: Oncology | Admitting: Oncology

## 2021-01-24 ENCOUNTER — Other Ambulatory Visit: Payer: Self-pay

## 2021-01-24 ENCOUNTER — Encounter (HOSPITAL_COMMUNITY): Payer: Self-pay

## 2021-01-24 DIAGNOSIS — C9000 Multiple myeloma not having achieved remission: Secondary | ICD-10-CM | POA: Insufficient documentation

## 2021-01-24 DIAGNOSIS — Z9484 Stem cells transplant status: Secondary | ICD-10-CM | POA: Insufficient documentation

## 2021-01-24 DIAGNOSIS — Z9221 Personal history of antineoplastic chemotherapy: Secondary | ICD-10-CM | POA: Insufficient documentation

## 2021-01-24 DIAGNOSIS — C419 Malignant neoplasm of bone and articular cartilage, unspecified: Secondary | ICD-10-CM

## 2021-01-24 DIAGNOSIS — M899 Disorder of bone, unspecified: Secondary | ICD-10-CM | POA: Diagnosis not present

## 2021-01-24 DIAGNOSIS — D649 Anemia, unspecified: Secondary | ICD-10-CM | POA: Insufficient documentation

## 2021-01-24 DIAGNOSIS — Z923 Personal history of irradiation: Secondary | ICD-10-CM | POA: Diagnosis not present

## 2021-01-24 DIAGNOSIS — D509 Iron deficiency anemia, unspecified: Secondary | ICD-10-CM | POA: Diagnosis not present

## 2021-01-24 DIAGNOSIS — C9010 Plasma cell leukemia not having achieved remission: Secondary | ICD-10-CM | POA: Diagnosis not present

## 2021-01-24 LAB — CBC WITH DIFFERENTIAL/PLATELET
Abs Immature Granulocytes: 0.12 10*3/uL — ABNORMAL HIGH (ref 0.00–0.07)
Basophils Absolute: 0 10*3/uL (ref 0.0–0.1)
Basophils Relative: 1 %
Eosinophils Absolute: 0.2 10*3/uL (ref 0.0–0.5)
Eosinophils Relative: 3 %
HCT: 22.4 % — ABNORMAL LOW (ref 39.0–52.0)
Hemoglobin: 7.4 g/dL — ABNORMAL LOW (ref 13.0–17.0)
Immature Granulocytes: 2 %
Lymphocytes Relative: 20 %
Lymphs Abs: 1.2 10*3/uL (ref 0.7–4.0)
MCH: 33 pg (ref 26.0–34.0)
MCHC: 33 g/dL (ref 30.0–36.0)
MCV: 100 fL (ref 80.0–100.0)
Monocytes Absolute: 0.6 10*3/uL (ref 0.1–1.0)
Monocytes Relative: 10 %
Neutro Abs: 4 10*3/uL (ref 1.7–7.7)
Neutrophils Relative %: 64 %
Platelets: 264 10*3/uL (ref 150–400)
RBC: 2.24 MIL/uL — ABNORMAL LOW (ref 4.22–5.81)
RDW: 18.3 % — ABNORMAL HIGH (ref 11.5–15.5)
WBC: 6.1 10*3/uL (ref 4.0–10.5)
nRBC: 0 % (ref 0.0–0.2)

## 2021-01-24 MED ORDER — FENTANYL CITRATE (PF) 100 MCG/2ML IJ SOLN
INTRAMUSCULAR | Status: AC | PRN
  Administered 2021-01-24 (×2): 50 ug via INTRAVENOUS

## 2021-01-24 MED ORDER — FENTANYL CITRATE (PF) 100 MCG/2ML IJ SOLN
INTRAMUSCULAR | Status: AC
  Filled 2021-01-24: qty 2

## 2021-01-24 MED ORDER — SODIUM CHLORIDE 0.9 % IV SOLN: INTRAVENOUS | Status: DC

## 2021-01-24 MED ORDER — LIDOCAINE-EPINEPHRINE (PF) 1 %-1:200000 IJ SOLN
INTRAMUSCULAR | Status: AC | PRN
  Administered 2021-01-24: 20 mL

## 2021-01-24 MED ORDER — HYDROCODONE-ACETAMINOPHEN 5-325 MG PO TABS: 1.0000 | ORAL_TABLET | ORAL | Status: DC | PRN

## 2021-01-24 NOTE — Discharge Instructions (Signed)
For questions /concerns may call Interventional Radiology at [phone] ° °You may remove your dressing and shower tomorrow afternoon ° ° ° °Bone Marrow Aspiration and Bone Marrow Biopsy, Adult, Care After °This sheet gives you information about how to care for yourself after your procedure. Your health care provider may also give you more specific instructions. If you have problems or questions, contact your health careprovider. °What can I expect after the procedure? °After the procedure, it is common to have: °Mild pain and tenderness. °Swelling. °Bruising. °Follow these instructions at home: °Puncture site care °Follow instructions from your health care provider about how to take care of the puncture site. Make sure you: °Wash your hands with soap and water before and after you change your bandage (dressing). If soap and water are not available, use hand sanitizer. °Change your dressing as told by your health care provider. °Check your puncture site every day for signs of infection. Check for: °More redness, swelling, or pain. °Fluid or blood. °Warmth. °Pus or a bad smell. ° °Activity °Return to your normal activities as told by your health care provider. Ask your health care provider what activities are safe for you. °Do not lift anything that is heavier than 10 lb (4.5 kg), or the limit that you are told, until your health care provider says that it is safe. °Do not drive for 24 hours if you were given a sedative during your procedure. °General instructions °Take over-the-counter and prescription medicines only as told by your health care provider. °Do not take baths, swim, or use a hot tub until your health care provider approves. Ask your health care provider if you may take showers. You may only be allowed to take sponge baths. °If directed, put ice on the affected area. To do this: °Put ice in a plastic bag. °Place a towel between your skin and the bag. °Leave the ice on for 20 minutes, 2-3 times a  day. °Keep all follow-up visits as told by your health care provider. This is important. ° °Contact a health care provider if: °Your pain is not controlled with medicine. °You have a fever. °You have more redness, swelling, or pain around the puncture site. °You have fluid or blood coming from the puncture site. °Your puncture site feels warm to the touch. °You have pus or a bad smell coming from the puncture site. °Summary °After the procedure, it is common to have mild pain, tenderness, swelling, and bruising. °Follow instructions from your health care provider about how to take care of the puncture site and what activities are safe for you. °Take over-the-counter and prescription medicines only as told by your health care provider. °Contact a health care provider if you have any signs of infection, such as fluid or blood coming from the puncture site. °This information is not intended to replace advice given to you by your health care provider. Make sure you discuss any questions you have with your healthcare provider. ° ° °Moderate Conscious Sedation, Adult, Care After °This sheet gives you information about how to care for yourself after your procedure. Your health care provider may also give you more specific instructions. If you have problems or questions, contact your health careprovider. °What can I expect after the procedure? °After the procedure, it is common to have: °Sleepiness for several hours. °Impaired judgment for several hours. °Difficulty with balance. °Vomiting if you eat too soon. °Follow these instructions at home: °For the time period you were told by your health care provider: °  Rest. °Do not participate in activities where you could fall or become injured. °Do not drive or use machinery. °Do not drink alcohol. °Do not take sleeping pills or medicines that cause drowsiness. °Do not make important decisions or sign legal documents. °Do not take care of children on your own. °Eating and  drinking ° °Follow the diet recommended by your health care provider. °Drink enough fluid to keep your urine pale yellow. °If you vomit: °Drink water, juice, or soup when you can drink without vomiting. °Make sure you have little or no nausea before eating solid foods. ° °General instructions °Take over-the-counter and prescription medicines only as told by your health care provider. °Have a responsible adult stay with you for the time you are told. It is important to have someone help care for you until you are awake and alert. °Do not smoke. °Keep all follow-up visits as told by your health care provider. This is important. °Contact a health care provider if: °You are still sleepy or having trouble with balance after 24 hours. °You feel light-headed. °You keep feeling nauseous or you keep vomiting. °You develop a rash. °You have a fever. °You have redness or swelling around the IV site. °Get help right away if: °You have trouble breathing. °You have new-onset confusion at home. °Summary °After the procedure, it is common to feel sleepy, have impaired judgment, or feel nauseous if you eat too soon. °Rest after you get home. Know the things you should not do after the procedure. °Follow the diet recommended by your health care provider and drink enough fluid to keep your urine pale yellow. °Get help right away if you have trouble breathing or new-onset confusion at home. °This information is not intended to replace advice given to you by your health care provider. Make sure you discuss any questions you have with your healthcare provider. °Document Revised: 05/20/2019 Document Reviewed: 12/16/2018 °Elsevier Patient Education © 2022 [employer]  °

## 2021-01-24 NOTE — H&P (Signed)
Chief Complaint: Patient was seen in consultation today for bone marrow biopsy with aspiration.   Referring Physician(s): Wyatt Portela  Supervising Physician: Ruthann Cancer  Patient Status: [facility] - Out-pt  History of Present Illness: Alexander Saunders is a 70 y.o. male with a medical history significant for multiple myeloma, diagnosed 2018. He is status post surgical decompression of an epidural mass, adjuvant radiation therapy, chemotherapy and stem cell transplant. Recent imaging was negative for new bone lesions but lab work shows a continuous rise in kappa light chains.  The oncology team would like to update his staging and has requested for Interventional Radiology to evaluate this patient for an image-guided bone marrow biopsy with aspiration.   Past Medical History:  Diagnosis Date   Anxiety    occasional    Blood transfusion without reported diagnosis 2018   with spinal surgery   History of chemotherapy    completed 06-17-2017   History of radiation therapy    completed 11-2016 per pt   Multiple myeloma ([place]) 2018   currently in remission    Neuromuscular disorder ([place])    neuropathy lower extremeties, primarily right leg    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   Stem cells transplant status ([place]) 06/2017    Past Surgical History:  Procedure Laterality Date   APPENDECTOMY     BONE MARROW BIOPSY  07/2018   COLONOSCOPY     KNEE ARTHROSCOPY     r/knee   LAMINECTOMY N/A 09/04/2016   Procedure: THORACIC FOUR-SIX LAMINECTOMY FOR RESECTION OF TUMOR;  Surgeon: Ditty, Kevan Ny, MD;  Location: [facility];  Service: Neurosurgery;  Laterality: N/A;    Allergies: Patient has no known allergies.  Medications: Prior to Admission medications   Medication Sig Start Date End Date Taking? Authorizing Provider  aspirin EC 81 MG tablet Take 81 mg by mouth daily. 01/29/17  Yes [provider]  B Complex Vitamins (B-COMPLEX/B-12) TABS Take 1 tablet by mouth daily.   Yes  [provider]  bisacodyl (DULCOLAX) 5 MG EC tablet Take 5 mg by mouth daily as needed for moderate constipation.   Yes [provider]  carboxymethylcellulose (REFRESH PLUS) 0.5 % SOLN Place 1 drop into both eyes 3 (three) times daily as needed (dry eyes). 08/15/20  Yes [provider]  cholecalciferol (VITAMIN D3) 25 MCG (1000 UNIT) tablet Take 1,000 Units by mouth daily.   Yes [provider]  cyclobenzaprine (FLEXERIL) 5 MG tablet Take 1 tablet (5 mg total) by mouth at bedtime as needed for muscle spasms. 02/13/20  Yes Patel, Donika K, DO  diphenhydramine-acetaminophen (TYLENOL PM EXTRA STRENGTH) 25-500 MG TABS tablet Take 1 tablet by mouth at bedtime as needed (pain).   Yes [provider]  gabapentin (NEURONTIN) 300 MG capsule TAKE 3 CAPSULE BY MOUTH THREE TIMES DAILY. 3 CAPSULE IN THE MORNING 3 CAPSULE IN THE AFTERNOON AND 3 CAPSULE AT NIGHT Patient taking differently: Take 900 mg by mouth 2 (two) times daily. 06/20/20  Yes Wyatt Portela, MD  losartan (COZAAR) 25 MG tablet Take 25 mg by mouth daily.   Yes [provider]  Magnesium 100 MG TABS Take 100 mg by mouth daily.   Yes [provider]  Multiple Vitamin (MULTIVITAMIN) capsule Take 1 capsule by mouth daily.   Yes [provider]  sodium chloride (OCEAN) 0.65 % SOLN nasal spray Place 1 spray into both nostrils daily as needed for congestion.   Yes [provider]  REVLIMID 10 MG capsule  TAKE 1 CAPSULE ONCE DAILY FOR 21 DAYS, THEN 7 DAYS OFF. REPEAT EVERY 28 DAYS 12/14/20   Wyatt Portela, MD     Family History  Problem Relation Age of Onset   Hypertension Mother    Cancer Mother    Lung cancer Mother    Hypertension Father    Colon cancer Neg Hx    Colon polyps Neg Hx    Esophageal cancer Neg Hx    Rectal cancer Neg Hx    Stomach cancer Neg Hx     Social History   Socioeconomic History   Marital status: Married    Spouse name: Not on file    Number of children: Not on file   Years of education: Not on file   Highest education level: Not on file  Occupational History   Not on file  Tobacco Use   Smoking status: Never   Smokeless tobacco: Never  Vaping Use   Vaping Use: Never used  Substance and Sexual Activity   Alcohol use: Yes    Comment: occ   Drug use: Never   Sexual activity: Not on file  Other Topics Concern   Not on file  Social History Narrative   He worked as a Paramedic level of education:  college   He is back in college at [employer] studying exercise physiology.       Right handed      Single story home   Social Determinants of Health   Financial Resource Strain: Not on file  Food Insecurity: Not on file  Transportation Needs: Not on file  Physical Activity: Not on file  Stress: Not on file  Social Connections: Not on file    Review of Systems: A 12 point ROS discussed and pertinent positives are indicated in the HPI above.  All other systems are negative.  Review of Systems  Constitutional:  Negative for appetite change and fatigue.  Respiratory:  Negative for cough and shortness of breath.   Cardiovascular:  Negative for chest pain and leg swelling.  Gastrointestinal:  Negative for abdominal pain, diarrhea, nausea and vomiting.  Neurological:  Negative for dizziness and headaches.   Vital Signs: BP (!) 104/57    Pulse 95    Temp 97.9 F (36.6 C) (Oral)    Resp 20    Ht 6' (1.829 m)    Wt 185 lb (83.9 kg)    SpO2 100%    BMI 25.09 kg/m   Physical Exam Constitutional:      General: He is not in acute distress.    Appearance: He is not ill-appearing.  HENT:     Mouth/Throat:     Mouth: Mucous membranes are moist.     Pharynx: Oropharynx is clear.  Cardiovascular:     Rate and Rhythm: Tachycardia present. Rhythm irregular.     Pulses: Normal pulses.     Heart sounds: Normal heart sounds.  Pulmonary:     Effort: Pulmonary effort is normal.     Breath sounds: Normal  breath sounds.  Abdominal:     General: Bowel sounds are normal.     Palpations: Abdomen is soft.     Tenderness: There is no abdominal tenderness.  Musculoskeletal:     Right lower leg: No edema.     Left lower leg: No edema.  Skin:    General: Skin is warm and dry.  Neurological:     Mental Status: He is alert and oriented to person,  place, and time.    Imaging: NM PET Image Restag (PS) Skull Base To Thigh  Result Date: 12/28/2020 CLINICAL DATA:  Initial treatment strategy for gastrointestinal cancer. History of multiple myeloma. EXAM: NUCLEAR MEDICINE PET SKULL BASE TO THIGH TECHNIQUE: 9.0 mCi F-18 FDG was injected intravenously. Full-ring PET imaging was performed from the skull base to thigh after the radiotracer. CT data was obtained and used for attenuation correction and anatomic localization. Fasting blood glucose: 96 mg/dl COMPARISON:  CT chest, abdomen and pelvis dated August 2nd 2018 FINDINGS: Mediastinal blood pool activity: SUV max 2.3 Liver activity: SUV max 3.2 NECK: No hypermetabolic lymph nodes in the neck. Incidental CT findings: none CHEST: No hypermetabolic mediastinal or hilar nodes. No suspicious pulmonary nodules on the CT scan. Incidental CT findings: Coronary artery calcifications of the LAD and circumflex. Mild atherosclerotic disease of the thoracic aorta. ABDOMEN/PELVIS: No abnormal hypermetabolic activity within the liver, pancreas, adrenal glands, or spleen. No hypermetabolic lymph nodes in the abdomen or pelvis. Incidental CT findings: Simple cyst of the left kidney. Mild atherosclerotic disease of the abdominal aorta. SKELETON: Numerous osseous lytic lesions are similar in appearance when compared with 2018 prior. No focal FDG uptake above background marrow activity. Incidental CT findings: Prior multilevel laminectomy of the thoracic spine. IMPRESSION: 1. No evidence of FDG avid nodal or visceral metastatic disease in the chest, abdomen or pelvis. 2. Numerous  osseous lytic lesions are similar in appearance when compared with 2018 prior. No focal FDG uptake above background marrow activity. Findings are compatible with history of multiple myeloma. 3. Coronary artery calcifications of the LAD and circumflex. 4. Mild aortic Atherosclerosis (ICD10-I70.0). Electronically Signed   By: Yetta Glassman M.D.   On: 12/28/2020 12:34    Labs:  CBC: Recent Labs    11/03/20 1516 11/30/20 1507 01/07/21 1117 01/24/21 0720  WBC 7.1 5.4 7.0 6.1  HGB 9.1* 8.0* 8.2* 7.4*  HCT 27.9* 24.3* 25.9* 22.4*  PLT 320 301 261 264    COAGS: No results for input(s): INR, APTT in the last 8760 hours.  BMP: Recent Labs    06/20/20 0802 11/03/20 1516 11/30/20 1507 01/07/21 1117  NA 142 135 139 141  K 3.8 3.8 4.1 3.8  CL 107 102 105 107  CO2 _0 GLUCOSE 108* 110* 95 110*  BUN _1 CALCIUM 9.5 9.2 9.8 10.0  CREATININE 0.91 1.00 0.91 1.02  GFRNONAA >60 >60 >60 >60    LIVER FUNCTION TESTS: Recent Labs    06/20/20 0802 11/03/20 1516 11/30/20 1507 01/07/21 1117  BILITOT 0.7 1.5* 0.8 0.5  AST 12* 35 16 13*  ALT 7 42 27 12  ALKPHOS 32* 68 80 51  PROT 7.2 7.5 7.2 7.4  ALBUMIN 3.6 3.6 3.0* 3.6    TUMOR MARKERS: No results for input(s): AFPTM, CEA, CA199, CHROMGRNA in the last 8760 hours.  Assessment and Plan:  Multiple myeloma; increase in kappa light chains: Alexander Saunders, 70 year old male, presents today to the [facility] department for an image-guided bone marrow biopsy with aspiration.   Risks and benefits of this procedure were discussed with the patient and/or patient's family including, but not limited to bleeding, infection, damage to adjacent structures or low yield requiring additional tests.  All of the questions were answered and there is agreement to proceed. He has not been NPO, this procedure will be done with just one sedating drug.   Consent signed and in chart.  Thank  you for this  interesting consult.  I greatly enjoyed meeting MATTHE SLOANE and look forward to participating in their care.  A copy of this report was sent to the requesting provider on this date.  Electronically Signed: Soyla Dryer, AGACNP-BC [phone] 01/24/2021, 8:07 AM   I spent a total of  30 Minutes   in face to face in clinical consultation, greater than 50% of which was counseling/coordinating care for bone marrow biopsy with aspiration.

## 2021-01-24 NOTE — Procedures (Signed)
Interventional Radiology Procedure Note  Procedure: CT guided aspirate and core biopsy of right iliac bone  Complications: None  Recommendations: - Bedrest supine x 1 hrs - Hydrocodone PRN  Pain - Follow biopsy results   Mylinh Cragg, MD   

## 2021-02-04 ENCOUNTER — Encounter: Payer: Self-pay | Admitting: Oncology

## 2021-02-05 ENCOUNTER — Encounter (HOSPITAL_COMMUNITY): Payer: Self-pay | Admitting: Oncology

## 2021-02-07 ENCOUNTER — Other Ambulatory Visit: Payer: Self-pay

## 2021-02-07 MED ORDER — GABAPENTIN 300 MG PO CAPS: ORAL_CAPSULE | ORAL | 3 refills | Status: DC

## 2021-02-11 ENCOUNTER — Inpatient Hospital Stay: Payer: Medicare Other | Attending: Oncology | Admitting: Oncology

## 2021-02-11 ENCOUNTER — Other Ambulatory Visit: Payer: Self-pay

## 2021-02-11 VITALS — BP 105/57 | HR 110 | Temp 98.2°F | Resp 20 | Wt 184.4 lb

## 2021-02-11 DIAGNOSIS — C9000 Multiple myeloma not having achieved remission: Secondary | ICD-10-CM | POA: Insufficient documentation

## 2021-02-11 DIAGNOSIS — D649 Anemia, unspecified: Secondary | ICD-10-CM | POA: Diagnosis not present

## 2021-02-11 DIAGNOSIS — Z9484 Stem cells transplant status: Secondary | ICD-10-CM | POA: Insufficient documentation

## 2021-02-11 DIAGNOSIS — Z5112 Encounter for antineoplastic immunotherapy: Secondary | ICD-10-CM | POA: Insufficient documentation

## 2021-02-11 DIAGNOSIS — G620 Drug-induced polyneuropathy: Secondary | ICD-10-CM | POA: Diagnosis not present

## 2021-02-11 DIAGNOSIS — Z7982 Long term (current) use of aspirin: Secondary | ICD-10-CM | POA: Diagnosis not present

## 2021-02-11 DIAGNOSIS — Z79899 Other long term (current) drug therapy: Secondary | ICD-10-CM | POA: Diagnosis not present

## 2021-02-11 DIAGNOSIS — G629 Polyneuropathy, unspecified: Secondary | ICD-10-CM | POA: Diagnosis not present

## 2021-02-11 DIAGNOSIS — Z23 Encounter for immunization: Secondary | ICD-10-CM | POA: Diagnosis not present

## 2021-02-11 DIAGNOSIS — D638 Anemia in other chronic diseases classified elsewhere: Secondary | ICD-10-CM | POA: Diagnosis not present

## 2021-02-11 DIAGNOSIS — Z125 Encounter for screening for malignant neoplasm of prostate: Secondary | ICD-10-CM | POA: Diagnosis not present

## 2021-02-11 DIAGNOSIS — Z0001 Encounter for general adult medical examination with abnormal findings: Secondary | ICD-10-CM | POA: Diagnosis not present

## 2021-02-11 DIAGNOSIS — I10 Essential (primary) hypertension: Secondary | ICD-10-CM | POA: Diagnosis not present

## 2021-02-11 NOTE — Progress Notes (Signed)
DISCONTINUE ON PATHWAY REGIMEN - Multiple Myeloma and Other Plasma Cell Dyscrasias     A cycle is every 21 days:     Bortezomib      Cyclophosphamide      Dexamethasone   **Always confirm dose/schedule in your pharmacy ordering system**  REASON: Disease Progression PRIOR TREATMENT: [id number]: CyBorD (Cyclophosphamide (IV) + Bortezomib Subcut D1, 4, 8, 11 + Dexamethasone) q21 Days x 4-8 Cycles TREATMENT RESPONSE: Complete Response (CR)  START ON PATHWAY REGIMEN - Multiple Myeloma and Other Plasma Cell Dyscrasias     Cycle 1: A cycle is 28 days:     Dexamethasone      Carfilzomib      Carfilzomib      Daratumumab and hyaluronidase-fihj    Cycle 2: A cycle is 28 days:     Dexamethasone      Carfilzomib      Daratumumab and hyaluronidase-fihj    Cycles 3 through 6: A cycle is every 28 days:     Dexamethasone      Dexamethasone      Carfilzomib      Daratumumab and hyaluronidase-fihj    Cycles 7 and beyond: A cycle is every 28 days:     Dexamethasone      Dexamethasone      Carfilzomib      Daratumumab and hyaluronidase-fihj   **Always confirm dose/schedule in your pharmacy ordering system**  Patient Characteristics: Multiple Myeloma, Relapsed / Refractory, Second through Fourth Lines of Therapy, Fit or Candidate for Triplet Therapy, Lenalidomide-Refractory or Lenalidomide-based Regimen Not Preferred, Candidate for Anti-CD38 Antibody Disease Classification: Multiple Myeloma R-ISS Staging: II Therapeutic Status: Relapsed Line of Therapy: Second Line Anti-CD38 Antibody Candidacy: Candidate for Anti-CD38 Antibody Lenalidomide-based Regimen Preference/Candidacy: Lenalidomide-Refractory Intent of Therapy: Non-Curative / Palliative Intent, Discussed with Patient

## 2021-02-11 NOTE — Progress Notes (Signed)
Hematology and Oncology Follow Up Visit  Alexander Saunders [id number] 1950-09-21 71 y.o. 02/11/2021 9:27 AM Alexander Saunders MDOsei-Bonsu, [name], MD   Principle Diagnosis: 71 year old man with kappa light chain multiple myeloma diagnosed in 2018.  He presented with epidural mass and bone marrow infiltration.  Prior Therapy:  He is status post surgical decompression of an epidural mass on 09/04/2016 and the pathology showed a plasma cell neoplasm.  He is S/P adjuvant radiation therapy to the thoracic spine to be completed on 10/14/2016.  Velcade 1.5 mg/m weekly with dexamethasone 20 mg and Cytoxan 600 mg po. Therapy started on 10/24/2016.   High-dose chemotherapy followed by stem cell transplant: Melphalan 200 mg/m2 completed on 06/17/17 followed by autologous stem cell rescue 06/18/17.  He achieved complete response.   Zometa 4 mg every 3 months started in February 2020.  Last treatment given in January 2022.   Revlimid 10 mg daily for 21 days out of a 28-day started in February 2020.  Therapy discontinued in November 2022 because of worsening anemia.  Current therapy: Under consideration for additional therapy.          Interim History:  [name] is here for a follow-up evaluation.  Since the last visit, he reports no major changes in his health.  He denies any recent hospitalizations or illnesses.  He does report some mild fatigue and tiredness but no other complaints.  He denies any nausea, vomiting or worsening neuropathy.  He denies any dyspnea on exertion.     Medications: Updated on review. Current Outpatient Medications  Medication Sig Dispense Refill   aspirin EC 81 MG tablet Take 81 mg by mouth daily.     B Complex Vitamins (B-COMPLEX/B-12) TABS Take 1 tablet by mouth daily.     bisacodyl (DULCOLAX) 5 MG EC tablet Take 5 mg by mouth daily as needed for moderate constipation.     carboxymethylcellulose (REFRESH PLUS) 0.5 % SOLN Place 1 drop into both eyes 3  (three) times daily as needed (dry eyes).     cholecalciferol (VITAMIN D3) 25 MCG (1000 UNIT) tablet Take 1,000 Units by mouth daily.     cyclobenzaprine (FLEXERIL) 5 MG tablet Take 1 tablet (5 mg total) by mouth at bedtime as needed for muscle spasms. 30 tablet 1   diphenhydramine-acetaminophen (TYLENOL PM EXTRA STRENGTH) 25-500 MG TABS tablet Take 1 tablet by mouth at bedtime as needed (pain).     gabapentin (NEURONTIN) 300 MG capsule TAKE 3 CAPSULE BY MOUTH THREE TIMES DAILY. 3 CAPSULE IN THE MORNING 3 CAPSULE IN THE AFTERNOON AND 3 CAPSULE AT NIGHT 270 capsule 3   losartan (COZAAR) 25 MG tablet Take 25 mg by mouth daily.     Magnesium 100 MG TABS Take 100 mg by mouth daily.     Multiple Vitamin (MULTIVITAMIN) capsule Take 1 capsule by mouth daily.     REVLIMID 10 MG capsule TAKE 1 CAPSULE ONCE DAILY FOR 21 DAYS, THEN 7 DAYS OFF. REPEAT EVERY 28 DAYS 21 capsule 0   sodium chloride (OCEAN) 0.65 % SOLN nasal spray Place 1 spray into both nostrils daily as needed for congestion.     No current facility-administered medications for this visit.     Allergies: No Known Allergies    Physical Exam:        Blood pressure (!) 105/57, pulse (!) 110, temperature 98.2 F (36.8 C), resp. rate 20, weight 184 lb 6.4 oz (83.6 kg), SpO2 100 %.      ECOG: 1  General appearance: Comfortable appearing without any discomfort Head: Normocephalic without any trauma Oropharynx: Mucous membranes are moist and pink without any thrush or ulcers. Eyes: Pupils are equal and round reactive to light. Lymph nodes: No cervical, supraclavicular, inguinal or axillary lymphadenopathy.   Heart:regular rate and rhythm.  S1 and S2 without leg edema. Lung: Clear without any rhonchi or wheezes.  No dullness to percussion. Abdomin: Soft, nontender, nondistended with good bowel sounds.  No hepatosplenomegaly. Musculoskeletal: No joint deformity or effusion.  Full range of motion noted. Neurological: No  deficits noted on motor, sensory and deep tendon reflex exam. Skin: No petechial rash or dryness.  Appeared moist.             Lab Results: Lab Results  Component Value Date   WBC 6.1 01/24/2021   HGB 7.4 (L) 01/24/2021   HCT 22.4 (L) 01/24/2021   MCV 100.0 01/24/2021   PLT 264 01/24/2021     Chemistry      Component Value Date/Time   NA 141 01/07/2021 1117   NA 137 02/06/2017 0828   K 3.8 01/07/2021 1117   K 4.0 02/06/2017 0828   CL 107 01/07/2021 1117   CO2 25 01/07/2021 1117   CO2 24 02/06/2017 0828   BUN 19 01/07/2021 1117   BUN 14.9 02/06/2017 0828   CREATININE 1.02 01/07/2021 1117   CREATININE 0.9 02/06/2017 0828      Component Value Date/Time   CALCIUM 10.0 01/07/2021 1117   CALCIUM 9.1 02/06/2017 0828   ALKPHOS 51 01/07/2021 1117   ALKPHOS 31 (L) 02/06/2017 0828   AST 13 (L) 01/07/2021 1117   AST 32 02/06/2017 0828   ALT 12 01/07/2021 1117   ALT 43 02/06/2017 0828   BILITOT 0.5 01/07/2021 1117   BILITOT 0.42 02/06/2017 0828      Latest Reference Range & Units 02/16/20 08:59 06/20/20 08:02 11/30/20 15:07 01/07/21 11:17  Kappa free light chain 3.3 - 19.4 mg/L 39.5 (H) 45.5 (H) 99.1 (H) 127.6 (H)  Lambda free light chains 5.7 - 26.3 mg/L 29.5 (H) 20.3 21.4 13.9  Kappa, lambda light chain ratio 0.26 - 1.65  1.34 2.24 (H) 4.63 (H) 9.18 (H)  (H): Data is abnormally high Study Result  Narrative & Impression  CLINICAL DATA:  Initial treatment strategy for gastrointestinal cancer. History of multiple myeloma.   EXAM: NUCLEAR MEDICINE PET SKULL BASE TO THIGH   TECHNIQUE: 9.0 mCi F-18 FDG was injected intravenously. Full-ring PET imaging was performed from the skull base to thigh after the radiotracer. CT data was obtained and used for attenuation correction and anatomic localization.   Fasting blood glucose: 96 mg/dl   COMPARISON:  CT chest, abdomen and pelvis dated August 2nd 2018   FINDINGS: Mediastinal blood pool activity: SUV max 2.3    Liver activity: SUV max 3.2   NECK: No hypermetabolic lymph nodes in the neck.   Incidental CT findings: none   CHEST: No hypermetabolic mediastinal or hilar nodes. No suspicious pulmonary nodules on the CT scan.   Incidental CT findings: Coronary artery calcifications of the LAD and circumflex. Mild atherosclerotic disease of the thoracic aorta.   ABDOMEN/PELVIS: No abnormal hypermetabolic activity within the liver, pancreas, adrenal glands, or spleen. No hypermetabolic lymph nodes in the abdomen or pelvis.   Incidental CT findings: Simple cyst of the left kidney. Mild atherosclerotic disease of the abdominal aorta.   SKELETON: Numerous osseous lytic lesions are similar in appearance when compared with 2018 prior. No focal FDG uptake above  background marrow activity.   Incidental CT findings: Prior multilevel laminectomy of the thoracic spine.   IMPRESSION: 1. No evidence of FDG avid nodal or visceral metastatic disease in the chest, abdomen or pelvis. 2. Numerous osseous lytic lesions are similar in appearance when compared with 2018 prior. No focal FDG uptake above background marrow activity. Findings are compatible with history of multiple myeloma. 3. Coronary artery calcifications of the LAD and circumflex. 4. Mild aortic Atherosclerosis (ICD10-I70.0).       Impression and Plan:  71 year old man with:   1.  Kappa light chain multiple myeloma diagnosed in 2018 with epidural involvement and bone marrow disease.     His disease status was updated including review of laboratory data as well as his bone marrow biopsy.  His presentation is consistent with disease relapse as evident by his worsening anemia and bone marrow involvement.  There is no bone disease apparent on PET imaging.  Treatment options at this time were discussed.  Daratumumab based elevation of therapy options with Kyprolis versus oral Pomalyst were discussed.  After discussion today, we opted to  proceed with daratumumab subcu as well as kyprolis.  Complication associated with this treatment include nausea, vomiting, myelosuppression, neutropenia and opportunistic infection were reviewed.  He is agreeable to proceed.  2.  Bone health: He has been previously treated with Zometa.  I see no evidence of disease relapse in the bone.   3.  Neuropathy: Manageable at this time without any exacerbation.  4.  VZV prophylaxis: Prescription for acyclovir will be available to him.  5.  Anemia: Related to plasma cell disorder.  Treating the underlying etiology will likely be beneficial at this time.  No need for transfusion.  6.  IV access: Peripheral veins will be in use at this time.  7.  Antiemetics: Prescription for Compazine will be available to him.  8. Follow-up: In the near future to start of therapy.  30 minutes were dedicated to this visit.  The time was spent on reviewing laboratory data, disease status update, treatment choices and outlining future plan of care.   Zola Button, MD 1/9/20239:27 AM

## 2021-02-12 NOTE — Progress Notes (Deleted)
Follow-up Visit   Date: 02/12/21   Alexander Saunders MRN: [id number] DOB: 1950/09/27   Interim History: Alexander Saunders is a 71 y.o. right-handed male with multiple myeloma manifesting with epidural mass s/p T4-6 laminectomy (2018) returning to the clinic for follow-up of bilateral leg paresthesias and low back pain.  The patient was accompanied to the clinic by self.  UPDATE 02/13/2020:  He is here for 1 year visit.  Around December 2021, he was increasing his strength training and noticed that his numbness was more pronounced in the feet, worse in the right leg and he had increased back spasms. He took rest for about a week which helped.  He remains on gabapentin $RemoveBefor'600mg'pEoHeRgreIcA$  twice daily.  He has not suffered any falls. Otherwise, he has been doing well. No progression of myeloma.   UPDATE 02/13/2021: ***  Medications:  Current Outpatient Medications on File Prior to Visit  Medication Sig Dispense Refill   aspirin EC 81 MG tablet Take 81 mg by mouth daily.     B Complex Vitamins (B-COMPLEX/B-12) TABS Take 1 tablet by mouth daily.     bisacodyl (DULCOLAX) 5 MG EC tablet Take 5 mg by mouth daily as needed for moderate constipation.     carboxymethylcellulose (REFRESH PLUS) 0.5 % SOLN Place 1 drop into both eyes 3 (three) times daily as needed (dry eyes).     cholecalciferol (VITAMIN D3) 25 MCG (1000 UNIT) tablet Take 1,000 Units by mouth daily.     cyclobenzaprine (FLEXERIL) 5 MG tablet Take 1 tablet (5 mg total) by mouth at bedtime as needed for muscle spasms. 30 tablet 1   diphenhydramine-acetaminophen (TYLENOL PM EXTRA STRENGTH) 25-500 MG TABS tablet Take 1 tablet by mouth at bedtime as needed (pain).     gabapentin (NEURONTIN) 300 MG capsule TAKE 3 CAPSULE BY MOUTH THREE TIMES DAILY. 3 CAPSULE IN THE MORNING 3 CAPSULE IN THE AFTERNOON AND 3 CAPSULE AT NIGHT 270 capsule 3   losartan (COZAAR) 25 MG tablet Take 25 mg by mouth daily.     Magnesium 100 MG TABS Take 100 mg by mouth daily.      Multiple Vitamin (MULTIVITAMIN) capsule Take 1 capsule by mouth daily.     REVLIMID 10 MG capsule TAKE 1 CAPSULE ONCE DAILY FOR 21 DAYS, THEN 7 DAYS OFF. REPEAT EVERY 28 DAYS 21 capsule 0   sodium chloride (OCEAN) 0.65 % SOLN nasal spray Place 1 spray into both nostrils daily as needed for congestion.     No current facility-administered medications on file prior to visit.    Allergies: No Known Allergies  Vital Signs:  There were no vitals taken for this visit.  Neurological Exam: MENTAL STATUS including orientation to time, place, person, recent and remote memory, attention span and concentration, language, and fund of knowledge is normal.  Speech is not dysarthric.  CRANIAL NERVES: Pupils round and reactive bilaterally. Extraocular muscles intact.     MOTOR:  Motor strength is 5/5 in all extremities.  No atrophy, fasciculations or abnormal movements.  No pronator drift.  Tone is normal.    MSRs:  Reflexes are 2+/4 throughout, except 3+/4 bilateral patella jerks.  SENSORY: Reduced vibration and temperature over the right leg and foot as compared to the left.  Temperature intact throughout  COORDINATION/GAIT:  Normal finger-to- nose-finger. Gait narrow based and stable. Tandem gait intact   IMPRESSION/PLAN: 1.  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  Residual myelopathy secondary to epidural neoplasm cause bilateral leg paresthesias, stable.  3.  Chronic low back pain, mild exacerbation following strength training.  - D/C tizanidine (ineffective)  - start flexeril 5mg  at bedtime  - If symptoms get worse, pt will call to start physical therapy  Return to clinic in 1 year  Thank you for allowing me to participate in patient's care.  If I can answer any additional questions, I would be pleased to do so.     Sincerely,    Khamille Beynon K. Posey Pronto, DO

## 2021-02-13 ENCOUNTER — Encounter: Payer: Self-pay | Admitting: Neurology

## 2021-02-13 ENCOUNTER — Ambulatory Visit: Payer: Medicare Other | Admitting: Neurology

## 2021-02-13 ENCOUNTER — Other Ambulatory Visit: Payer: Self-pay

## 2021-02-13 DIAGNOSIS — Z029 Encounter for administrative examinations, unspecified: Secondary | ICD-10-CM

## 2021-02-13 LAB — SURGICAL PATHOLOGY

## 2021-02-14 ENCOUNTER — Ambulatory Visit: Payer: TRICARE For Life (TFL) | Admitting: Neurology

## 2021-02-15 DIAGNOSIS — Z20822 Contact with and (suspected) exposure to covid-19: Secondary | ICD-10-CM | POA: Diagnosis not present

## 2021-02-18 ENCOUNTER — Other Ambulatory Visit: Payer: Self-pay | Admitting: Internal Medicine

## 2021-02-18 ENCOUNTER — Other Ambulatory Visit: Payer: Self-pay | Admitting: *Deleted

## 2021-02-18 ENCOUNTER — Encounter: Payer: Self-pay | Admitting: Oncology

## 2021-02-18 ENCOUNTER — Other Ambulatory Visit: Payer: Self-pay

## 2021-02-18 ENCOUNTER — Inpatient Hospital Stay: Payer: Medicare Other

## 2021-02-18 ENCOUNTER — Other Ambulatory Visit: Payer: Self-pay | Admitting: Physician Assistant

## 2021-02-18 DIAGNOSIS — D649 Anemia, unspecified: Secondary | ICD-10-CM

## 2021-02-18 DIAGNOSIS — G629 Polyneuropathy, unspecified: Secondary | ICD-10-CM | POA: Diagnosis not present

## 2021-02-18 DIAGNOSIS — Z79899 Other long term (current) drug therapy: Secondary | ICD-10-CM | POA: Diagnosis not present

## 2021-02-18 DIAGNOSIS — Z7982 Long term (current) use of aspirin: Secondary | ICD-10-CM | POA: Diagnosis not present

## 2021-02-18 DIAGNOSIS — C9000 Multiple myeloma not having achieved remission: Secondary | ICD-10-CM | POA: Diagnosis not present

## 2021-02-18 DIAGNOSIS — Z5112 Encounter for antineoplastic immunotherapy: Secondary | ICD-10-CM | POA: Diagnosis not present

## 2021-02-18 LAB — CMP (CANCER CENTER ONLY)
ALT: 19 U/L (ref 0–44)
AST: 13 U/L — ABNORMAL LOW (ref 15–41)
Albumin: 3.9 g/dL (ref 3.5–5.0)
Alkaline Phosphatase: 115 U/L (ref 38–126)
Anion gap: 10 (ref 5–15)
BUN: 23 mg/dL (ref 8–23)
CO2: 23 mmol/L (ref 22–32)
Calcium: 10.2 mg/dL (ref 8.9–10.3)
Chloride: 106 mmol/L (ref 98–111)
Creatinine: 1.35 mg/dL — ABNORMAL HIGH (ref 0.61–1.24)
GFR, Estimated: 56 mL/min — ABNORMAL LOW (ref >60)
Glucose, Bld: 104 mg/dL — ABNORMAL HIGH (ref 70–99)
Potassium: 4 mmol/L (ref 3.5–5.1)
Sodium: 139 mmol/L (ref 135–145)
Total Bilirubin: 1.3 mg/dL — ABNORMAL HIGH (ref 0.3–1.2)
Total Protein: 7.1 g/dL (ref 6.5–8.1)

## 2021-02-18 LAB — CBC WITH DIFFERENTIAL (CANCER CENTER ONLY)
Abs Immature Granulocytes: 0.15 10*3/uL — ABNORMAL HIGH (ref 0.00–0.07)
Basophils Absolute: 0 10*3/uL (ref 0.0–0.1)
Basophils Relative: 0 %
Eosinophils Absolute: 0.1 10*3/uL (ref 0.0–0.5)
Eosinophils Relative: 2 %
HCT: 20.5 % — ABNORMAL LOW (ref 39.0–52.0)
Hemoglobin: 6.5 g/dL — CL (ref 13.0–17.0)
Immature Granulocytes: 2 %
Lymphocytes Relative: 17 %
Lymphs Abs: 1.1 10*3/uL (ref 0.7–4.0)
MCH: 32.3 pg (ref 26.0–34.0)
MCHC: 31.7 g/dL (ref 30.0–36.0)
MCV: 102 fL — ABNORMAL HIGH (ref 80.0–100.0)
Monocytes Absolute: 0.7 10*3/uL (ref 0.1–1.0)
Monocytes Relative: 11 %
Neutro Abs: 4.5 10*3/uL (ref 1.7–7.7)
Neutrophils Relative %: 68 %
Platelet Count: 258 10*3/uL (ref 150–400)
RBC: 2.01 MIL/uL — ABNORMAL LOW (ref 4.22–5.81)
RDW: 18.6 % — ABNORMAL HIGH (ref 11.5–15.5)
WBC Count: 6.6 10*3/uL (ref 4.0–10.5)
nRBC: 0 % (ref 0.0–0.2)

## 2021-02-18 LAB — PREPARE RBC (CROSSMATCH)

## 2021-02-18 LAB — PRETREATMENT RBC PHENOTYPE

## 2021-02-18 LAB — SAMPLE TO BLOOD BANK

## 2021-02-18 MED ORDER — DIPHENHYDRAMINE HCL 25 MG PO CAPS
25.0000 mg | ORAL_CAPSULE | Freq: Once | ORAL | Status: AC
  Administered 2021-02-18: 25 mg via ORAL
  Filled 2021-02-18: qty 1

## 2021-02-18 MED ORDER — SODIUM CHLORIDE 0.9% IV SOLUTION
250.0000 mL | Freq: Once | INTRAVENOUS | Status: AC
  Administered 2021-02-18: 250 mL via INTRAVENOUS

## 2021-02-18 MED ORDER — ACETAMINOPHEN 325 MG PO TABS
650.0000 mg | ORAL_TABLET | Freq: Once | ORAL | Status: AC
  Administered 2021-02-18: 650 mg via ORAL
  Filled 2021-02-18: qty 2

## 2021-02-18 NOTE — Progress Notes (Signed)
Hgb 6.5. Dr Julien Nordmann notified

## 2021-02-18 NOTE — Patient Instructions (Signed)
Blood Transfusion, Adult, Care After This sheet gives you information about how to care for yourself after your procedure. Your doctor may also give you more specific instructions. If you have problems or questions, contact your doctor. What can I expect after the procedure? After the procedure, it is common to have: Bruising and soreness at the IV site. A headache. Follow these instructions at home: Insertion site care   Follow instructions from your doctor about how to take care of your insertion site. This is where an IV tube was put into your vein. Make sure you: Wash your hands with soap and water before and after you change your bandage (dressing). If you cannot use soap and water, use hand sanitizer. Change your bandage as told by your doctor. Check your insertion site every day for signs of infection. Check for: Redness, swelling, or pain. Bleeding from the site. Warmth. Pus or a bad smell. General instructions Take over-the-counter and prescription medicines only as told by your doctor. Rest as told by your doctor. Go back to your normal activities as told by your doctor. Keep all follow-up visits as told by your doctor. This is important. Contact a doctor if: You have itching or red, swollen areas of skin (hives). You feel worried or nervous (anxious). You feel weak after doing your normal activities. You have redness, swelling, warmth, or pain around the insertion site. You have blood coming from the insertion site, and the blood does not stop with pressure. You have pus or a bad smell coming from the insertion site. Get help right away if: You have signs of a serious reaction. This may be coming from an allergy or the body's defense system (immune system). Signs include: Trouble breathing or shortness of breath. Swelling of the face or feeling warm (flushed). Fever or chills. Head, chest, or back pain. Dark pee (urine) or blood in the pee. Widespread rash. Fast  heartbeat. Feeling dizzy or light-headed. You may receive your blood transfusion in an outpatient setting. If so, you will be told whom to contact to report any reactions. These symptoms may be an emergency. Do not wait to see if the symptoms will go away. Get medical help right away. Call your local emergency services (911 in the [place] Do not drive yourself to the hospital. Summary Bruising and soreness at the IV site are common. Check your insertion site every day for signs of infection. Rest as told by your doctor. Go back to your normal activities as told by your doctor. Get help right away if you have signs of a serious reaction. This information is not intended to replace advice given to you by your health care provider. Make sure you discuss any questions you have with your health care provider. Document Revised: 05/17/2020 Document Reviewed: 07/15/2018 Elsevier Patient Education  2022 Elsevier Inc.  

## 2021-02-19 ENCOUNTER — Inpatient Hospital Stay: Payer: Medicare Other

## 2021-02-19 ENCOUNTER — Encounter: Payer: Self-pay | Admitting: Oncology

## 2021-02-19 VITALS — BP 107/61 | HR 101 | Temp 97.9°F | Resp 18

## 2021-02-19 DIAGNOSIS — G629 Polyneuropathy, unspecified: Secondary | ICD-10-CM | POA: Diagnosis not present

## 2021-02-19 DIAGNOSIS — C9000 Multiple myeloma not having achieved remission: Secondary | ICD-10-CM

## 2021-02-19 DIAGNOSIS — Z5112 Encounter for antineoplastic immunotherapy: Secondary | ICD-10-CM | POA: Diagnosis not present

## 2021-02-19 DIAGNOSIS — Z7982 Long term (current) use of aspirin: Secondary | ICD-10-CM | POA: Diagnosis not present

## 2021-02-19 DIAGNOSIS — D649 Anemia, unspecified: Secondary | ICD-10-CM | POA: Diagnosis not present

## 2021-02-19 DIAGNOSIS — Z79899 Other long term (current) drug therapy: Secondary | ICD-10-CM | POA: Diagnosis not present

## 2021-02-19 LAB — PREPARE RBC (CROSSMATCH)

## 2021-02-19 MED ORDER — SODIUM CHLORIDE 0.9 % IV SOLN: Freq: Once | INTRAVENOUS | Status: AC

## 2021-02-19 MED ORDER — SODIUM CHLORIDE 0.9 % IV SOLN
20.0000 mg | Freq: Once | INTRAVENOUS | Status: AC
  Administered 2021-02-19: 20 mg via INTRAVENOUS
  Filled 2021-02-19: qty 20

## 2021-02-19 MED ORDER — SODIUM CHLORIDE 0.9% IV SOLUTION
250.0000 mL | Freq: Once | INTRAVENOUS | Status: AC
  Administered 2021-02-19: 250 mL via INTRAVENOUS

## 2021-02-19 MED ORDER — DIPHENHYDRAMINE HCL 25 MG PO CAPS
25.0000 mg | ORAL_CAPSULE | Freq: Once | ORAL | Status: AC
  Administered 2021-02-19: 25 mg via ORAL
  Filled 2021-02-19: qty 1

## 2021-02-19 MED ORDER — ACETAMINOPHEN 325 MG PO TABS
650.0000 mg | ORAL_TABLET | Freq: Once | ORAL | Status: AC
  Administered 2021-02-19: 650 mg via ORAL
  Filled 2021-02-19: qty 2

## 2021-02-19 MED ORDER — MONTELUKAST SODIUM 10 MG PO TABS
10.0000 mg | ORAL_TABLET | Freq: Once | ORAL | Status: AC
  Administered 2021-02-19: 10 mg via ORAL
  Filled 2021-02-19: qty 1

## 2021-02-19 MED ORDER — DIPHENHYDRAMINE HCL 25 MG PO CAPS
50.0000 mg | ORAL_CAPSULE | Freq: Once | ORAL | Status: DC
  Filled 2021-02-19: qty 2

## 2021-02-19 MED ORDER — DEXTROSE 5 % IV SOLN
20.0000 mg/m2 | Freq: Once | INTRAVENOUS | Status: AC
  Administered 2021-02-19: 40 mg via INTRAVENOUS
  Filled 2021-02-19: qty 15

## 2021-02-19 MED ORDER — ACETAMINOPHEN 325 MG PO TABS
650.0000 mg | ORAL_TABLET | Freq: Once | ORAL | Status: DC
  Filled 2021-02-19: qty 2

## 2021-02-19 MED ORDER — DARATUMUMAB-HYALURONIDASE-FIHJ 1800-30000 MG-UT/15ML ~~LOC~~ SOLN
1800.0000 mg | Freq: Once | SUBCUTANEOUS | Status: AC
  Administered 2021-02-19: 1800 mg via SUBCUTANEOUS
  Filled 2021-02-19: qty 15

## 2021-02-19 NOTE — Progress Notes (Signed)
Per Dr.Shadad, ok to proceed with kyprolis and darzalex faspro with HBG of 6.5 and HR 115 after 2nd unit of blood.

## 2021-02-19 NOTE — Patient Instructions (Signed)
[facility]  Discharge Instructions: Thank you for choosing [facility] to provide your oncology and hematology care.   If you have a lab appointment with the [facility], please go directly to the [facility] and check in at the registration area.   Wear comfortable clothing and clothing appropriate for easy access to any Portacath or PICC line.   We strive to give you quality time with your provider. You may need to reschedule your appointment if you arrive late (15 or more minutes).  Arriving late affects you and other patients whose appointments are after yours.  Also, if you miss three or more appointments without notifying the office, you may be dismissed from the clinic at the providers discretion.      For prescription refill requests, have your pharmacy contact our office and allow 72 hours for refills to be completed.    Today you received the following chemotherapy and/or immunotherapy agents kyprolis and darzalex faspro      To help prevent nausea and vomiting after your treatment, we encourage you to take your nausea medication as directed.  BELOW ARE SYMPTOMS THAT SHOULD BE REPORTED IMMEDIATELY: *FEVER GREATER THAN 100.4 F (38 C) OR HIGHER *CHILLS OR SWEATING *NAUSEA AND VOMITING THAT IS NOT CONTROLLED WITH YOUR NAUSEA MEDICATION *UNUSUAL SHORTNESS OF BREATH *UNUSUAL BRUISING OR BLEEDING *URINARY PROBLEMS (pain or burning when urinating, or frequent urination) *BOWEL PROBLEMS (unusual diarrhea, constipation, pain near the anus) TENDERNESS IN MOUTH AND THROAT WITH OR WITHOUT PRESENCE OF ULCERS (sore throat, sores in mouth, or a toothache) UNUSUAL RASH, SWELLING OR PAIN  UNUSUAL VAGINAL DISCHARGE OR ITCHING   Items with * indicate a potential emergency and should be followed up as soon as possible or go to the Emergency Department if any problems should occur.  Please show the CHEMOTHERAPY ALERT CARD or IMMUNOTHERAPY ALERT  CARD at check-in to the Emergency Department and triage nurse.  Should you have questions after your visit or need to cancel or reschedule your appointment, please contact [facility]  Dept: [phone]  and follow the prompts.  Office hours are 8:00 a.m. to 4:30 p.m. Monday - Friday. Please note that voicemails left after 4:00 p.m. may not be returned until the following business day.  We are closed weekends and major holidays. You have access to a nurse at all times for urgent questions. Please call the main number to the clinic Dept: [phone] and follow the prompts.   For any non-urgent questions, you may also contact your provider using MyChart. We now offer e-Visits for anyone 10 and older to request care online for non-urgent symptoms. For details visit [email or web address].   Also download the MyChart app! Go to the app store, search "MyChart", open the app, select [facility], and log in with your MyChart username and password.  Due to Covid, a mask is required upon entering the hospital/clinic. If you do not have a mask, one will be given to you upon arrival. For doctor visits, patients may have 1 support person aged 36 or older with them. For treatment visits, patients cannot have anyone with them due to current Covid guidelines and our immunocompromised population.   Carfilzomib injection What is this medication? CARFILZOMIB (kar FILZ oh mib) targets a specific protein within cancer cells and stops the cancer cells from growing. It is used to treat multiple myeloma. This medicine may be used for other purposes; ask your health care provider or  pharmacist if you have questions. COMMON BRAND NAME(S): KYPROLIS What should I tell my care team before I take this medication? They need to know if you have any of these conditions: heart disease history of blood clots irregular heartbeat kidney disease liver disease lung or breathing disease an unusual  or allergic reaction to carfilzomib, or other medicines, foods, dyes, or preservatives pregnant or trying to get pregnant breast-feeding How should I use this medication? This medicine is for injection or infusion into a vein. It is given by a health care professional in a hospital or clinic setting. Talk to your pediatrician regarding the use of this medicine in children. Special care may be needed. Overdosage: If you think you have taken too much of this medicine contact a poison control center or emergency room at once. NOTE: This medicine is only for you. Do not share this medicine with others. What if I miss a dose? It is important not to miss your dose. Call your doctor or health care professional if you are unable to keep an appointment. What may interact with this medication? Interactions are not expected. This list may not describe all possible interactions. Give your health care provider a list of all the medicines, herbs, non-prescription drugs, or dietary supplements you use. Also tell them if you smoke, drink alcohol, or use illegal drugs. Some items may interact with your medicine. What should I watch for while using this medication? Your condition will be monitored while you are receiving this medicine. You may need blood work done while you are taking this medicine. Do not become pregnant while taking this medicine or for 6 months after stopping it. Women should inform their health care provider if they wish to become pregnant or think they might be pregnant. Men should not father a child while taking this medicine and for 3 months after stopping it. There is a potential for serious side effects to an unborn child. Talk to your health care provider for more information. Do not breast-feed an infant while taking this medicine or for 2 weeks after stopping it. Check with your health care provider if you have severe diarrhea, nausea, and vomiting, or if you sweat a lot. The loss of too  much body fluid may make it dangerous for you to take this medicine. You may get drowsy or dizzy. Do not drive, use machinery, or do anything that needs mental alertness until you know how this medicine affects you. Do not stand up or sit up quickly, especially if you are an older patient. This reduces the risk of dizzy or fainting spells. What side effects may I notice from receiving this medication? Side effects that you should report to your doctor or health care professional as soon as possible: allergic reactions like skin rash, itching or hives, swelling of the face, lips, or tongue confusion dizziness feeling faint or lightheaded fever or chills palpitations seizures signs and symptoms of bleeding such as bloody or black, tarry stools; red or dark-brown urine; spitting up blood or brown material that looks like coffee grounds; red spots on the skin; unusual bruising or bleeding including from the eye, gums, or nose signs and symptoms of a blood clot such as breathing problems; changes in vision; chest pain; severe, sudden headache; pain, swelling, warmth in the leg; trouble speaking; sudden numbness or weakness of the face, arm or leg signs and symptoms of kidney injury like trouble passing urine or change in the amount of urine signs and symptoms of  liver injury like dark yellow or brown urine; general ill feeling or flu-like symptoms; light-colored stools; loss of appetite; nausea; right upper belly pain; unusually weak or tired; yellowing of the eyes or skin Side effects that usually do not require medical attention (report to your doctor or health care professional if they continue or are bothersome): back pain cough diarrhea headache muscle cramps trouble sleeping vomiting This list may not describe all possible side effects. Call your doctor for medical advice about side effects. You may report side effects to FDA at 1-800-FDA-1088. Where should I keep my medication? This drug  is given in a hospital or clinic and will not be stored at home. NOTE: This sheet is a summary. It may not cover all possible information. If you have questions about this medicine, talk to your doctor, pharmacist, or health care provider.  2022 Elsevier/Gold Standard (2020-03-01 00:00:00)  Daratumumab; Hyaluronidase Injection What is this medication? DARATUMUMAB; HYALURONIDASE (dar a toom ue mab / hye al ur ON i dase) is a monoclonal antibody. Hyaluronidase is used to improve the effects of daratumumab. It treats certain types of cancer. Some of the cancers treated are multiple myeloma and light-chain amyloidosis. This medicine may be used for other purposes; ask your health care provider or pharmacist if you have questions. COMMON BRAND NAME(S): DARZALEX FASPRO What should I tell my care team before I take this medication? They need to know if you have any of these conditions: heart disease infection especially a viral infection such as chickenpox, cold sores, herpes, or hepatitis B lung or breathing disease an unusual or allergic reaction to daratumumab, hyaluronidase, other medicines, foods, dyes, or preservatives pregnant or trying to get pregnant breast-feeding How should I use this medication? This medicine is for injection under the skin. It is given by a health care professional in a hospital or clinic setting. Talk to your pediatrician regarding the use of this medicine in children. Special care may be needed. Overdosage: If you think you have taken too much of this medicine contact a poison control center or emergency room at once. NOTE: This medicine is only for you. Do not share this medicine with others. What if I miss a dose? Keep appointments for follow-up doses as directed. It is important not to miss your dose. Call your doctor or health care professional if you are unable to keep an appointment. What may interact with this medication? Interactions have not been  studied. This list may not describe all possible interactions. Give your health care provider a list of all the medicines, herbs, non-prescription drugs, or dietary supplements you use. Also tell them if you smoke, drink alcohol, or use illegal drugs. Some items may interact with your medicine. What should I watch for while using this medication? Your condition will be monitored carefully while you are receiving this medicine. This medicine can cause serious allergic reactions. To reduce your risk, your health care provider may give you other medicine to take before receiving this one. Be sure to follow the directions from your health care provider. This medicine can affect the results of blood tests to match your blood type. These changes can last for up to 6 months after the final dose. Your healthcare provider will do blood tests to match your blood type before you start treatment. Tell all of your healthcare providers that you are being treated with this medicine before receiving a blood transfusion. This medicine can affect the results of some tests used to determine treatment  response; extra tests may be needed to evaluate response. Do not become pregnant while taking this medicine or for 3 months after stopping it. Women should inform their health care provider if they wish to become pregnant or think they might be pregnant. There is a potential for serious side effects to an unborn child. Talk to your health care provider for more information. Do not breast-feed an infant while taking this medicine. What side effects may I notice from receiving this medication? Side effects that you should report to your care team as soon as possible: Allergic reactions--skin rash, itching or hives, swelling of the face, lips, or tongue Blood clot--chest pain, shortness of breath, pain, swelling or warmth in the leg Blurred vision Fast, irregular heartbeat Infection--fever, chills, cough, sore throat, pain or  trouble passing urine Injection reactions--dizziness, fast heartbeat, feeling faint or lightheaded, falls, headache, increase in blood pressure, nausea, vomiting, or wheezing or trouble breathing with loud or whistling sounds Low red blood cell counts--trouble breathing, feeling faint, lightheaded or falls, unusually weak or tired Unusual bleeding or bruising Side effects that usually do not require medical attention (report these to your care team if they continue or are bothersome): Back pain Constipation Diarrhea Pain, tingling, numbness in the hands or feet Pain, redness, or irritation at site where injected Muscle cramp or pain Swelling of the ankles, feet, hands Tiredness Trouble sleeping This list may not describe all possible side effects. Call your doctor for medical advice about side effects. You may report side effects to FDA at 1-800-FDA-1088. Where should I keep my medication? This drug is given in a hospital or clinic and will not be stored at home. NOTE: This sheet is a summary. It may not cover all possible information. If you have questions about this medicine, talk to your doctor, pharmacist, or health care provider.  2022 Elsevier/Gold Standard (2020-10-09 00:00:00)

## 2021-02-20 LAB — BPAM RBC
Blood Product Expiration Date: 202302132359
Blood Product Expiration Date: 202302132359
ISSUE DATE / TIME: 202301161449
ISSUE DATE / TIME: 202301171137
Unit Type and Rh: 5100
Unit Type and Rh: 5100

## 2021-02-20 LAB — TYPE AND SCREEN
ABO/RH(D): O POS
Antibody Screen: NEGATIVE
Unit division: 0
Unit division: 0

## 2021-02-25 MED FILL — Dexamethasone Sodium Phosphate Inj 100 MG/10ML: INTRAMUSCULAR | Qty: 2 | Status: AC

## 2021-02-26 ENCOUNTER — Other Ambulatory Visit: Payer: Self-pay

## 2021-02-26 ENCOUNTER — Inpatient Hospital Stay: Payer: Medicare Other

## 2021-02-26 VITALS — BP 108/61 | HR 98 | Temp 98.1°F | Resp 18 | Wt 177.8 lb

## 2021-02-26 DIAGNOSIS — Z79899 Other long term (current) drug therapy: Secondary | ICD-10-CM | POA: Diagnosis not present

## 2021-02-26 DIAGNOSIS — Z7982 Long term (current) use of aspirin: Secondary | ICD-10-CM | POA: Diagnosis not present

## 2021-02-26 DIAGNOSIS — Z5112 Encounter for antineoplastic immunotherapy: Secondary | ICD-10-CM | POA: Diagnosis not present

## 2021-02-26 DIAGNOSIS — C9000 Multiple myeloma not having achieved remission: Secondary | ICD-10-CM

## 2021-02-26 DIAGNOSIS — D649 Anemia, unspecified: Secondary | ICD-10-CM | POA: Diagnosis not present

## 2021-02-26 DIAGNOSIS — G629 Polyneuropathy, unspecified: Secondary | ICD-10-CM | POA: Diagnosis not present

## 2021-02-26 LAB — CBC WITH DIFFERENTIAL (CANCER CENTER ONLY)
Abs Immature Granulocytes: 0.04 10*3/uL (ref 0.00–0.07)
Basophils Absolute: 0 10*3/uL (ref 0.0–0.1)
Basophils Relative: 0 %
Eosinophils Absolute: 0.2 10*3/uL (ref 0.0–0.5)
Eosinophils Relative: 3 %
HCT: 29.1 % — ABNORMAL LOW (ref 39.0–52.0)
Hemoglobin: 9.4 g/dL — ABNORMAL LOW (ref 13.0–17.0)
Immature Granulocytes: 1 %
Lymphocytes Relative: 13 %
Lymphs Abs: 0.8 10*3/uL (ref 0.7–4.0)
MCH: 31.2 pg (ref 26.0–34.0)
MCHC: 32.3 g/dL (ref 30.0–36.0)
MCV: 96.7 fL (ref 80.0–100.0)
Monocytes Absolute: 0.9 10*3/uL (ref 0.1–1.0)
Monocytes Relative: 14 %
Neutro Abs: 4.1 10*3/uL (ref 1.7–7.7)
Neutrophils Relative %: 69 %
Platelet Count: 343 10*3/uL (ref 150–400)
RBC: 3.01 MIL/uL — ABNORMAL LOW (ref 4.22–5.81)
RDW: 19.5 % — ABNORMAL HIGH (ref 11.5–15.5)
WBC Count: 6 10*3/uL (ref 4.0–10.5)
nRBC: 0 % (ref 0.0–0.2)

## 2021-02-26 LAB — CMP (CANCER CENTER ONLY)
ALT: 13 U/L (ref 0–44)
AST: 9 U/L — ABNORMAL LOW (ref 15–41)
Albumin: 3.7 g/dL (ref 3.5–5.0)
Alkaline Phosphatase: 77 U/L (ref 38–126)
Anion gap: 6 (ref 5–15)
BUN: 12 mg/dL (ref 8–23)
CO2: 25 mmol/L (ref 22–32)
Calcium: 9.7 mg/dL (ref 8.9–10.3)
Chloride: 105 mmol/L (ref 98–111)
Creatinine: 0.96 mg/dL (ref 0.61–1.24)
GFR, Estimated: >60 mL/min (ref >60)
Glucose, Bld: 92 mg/dL (ref 70–99)
Potassium: 4.2 mmol/L (ref 3.5–5.1)
Sodium: 136 mmol/L (ref 135–145)
Total Bilirubin: 0.9 mg/dL (ref 0.3–1.2)
Total Protein: 6.6 g/dL (ref 6.5–8.1)

## 2021-02-26 MED ORDER — MONTELUKAST SODIUM 10 MG PO TABS
10.0000 mg | ORAL_TABLET | Freq: Once | ORAL | Status: AC
  Administered 2021-02-26: 14:00:00 10 mg via ORAL
  Filled 2021-02-26: qty 1

## 2021-02-26 MED ORDER — SODIUM CHLORIDE 0.9 % IV SOLN
20.0000 mg | Freq: Once | INTRAVENOUS | Status: AC
  Administered 2021-02-26: 14:00:00 20 mg via INTRAVENOUS
  Filled 2021-02-26: qty 20

## 2021-02-26 MED ORDER — HEPARIN SOD (PORK) LOCK FLUSH 100 UNIT/ML IV SOLN: 500.0000 [IU] | Freq: Once | INTRAVENOUS | Status: DC | PRN

## 2021-02-26 MED ORDER — DIPHENHYDRAMINE HCL 25 MG PO CAPS
50.0000 mg | ORAL_CAPSULE | Freq: Once | ORAL | Status: AC
  Administered 2021-02-26: 14:00:00 50 mg via ORAL
  Filled 2021-02-26: qty 2

## 2021-02-26 MED ORDER — DEXTROSE 5 % IV SOLN
70.0000 mg/m2 | Freq: Once | INTRAVENOUS | Status: AC
  Administered 2021-02-26: 15:00:00 140 mg via INTRAVENOUS
  Filled 2021-02-26: qty 60

## 2021-02-26 MED ORDER — SODIUM CHLORIDE 0.9 % IV SOLN: Freq: Once | INTRAVENOUS | Status: AC

## 2021-02-26 MED ORDER — ACETAMINOPHEN 325 MG PO TABS
650.0000 mg | ORAL_TABLET | Freq: Once | ORAL | Status: AC
  Administered 2021-02-26: 14:00:00 650 mg via ORAL
  Filled 2021-02-26: qty 2

## 2021-02-26 MED ORDER — DARATUMUMAB-HYALURONIDASE-FIHJ 1800-30000 MG-UT/15ML ~~LOC~~ SOLN
1800.0000 mg | Freq: Once | SUBCUTANEOUS | Status: AC
  Administered 2021-02-26: 16:00:00 1800 mg via SUBCUTANEOUS
  Filled 2021-02-26: qty 15

## 2021-02-26 MED ORDER — SODIUM CHLORIDE 0.9% FLUSH: 10.0000 mL | INTRAVENOUS | Status: DC | PRN

## 2021-02-26 NOTE — Patient Instructions (Signed)
[facility]  Discharge Instructions: Thank you for choosing [facility] to provide your oncology and hematology care.   If you have a lab appointment with the [facility], please go directly to the [facility] and check in at the registration area.   Wear comfortable clothing and clothing appropriate for easy access to any Portacath or PICC line.   We strive to give you quality time with your provider. You may need to reschedule your appointment if you arrive late (15 or more minutes).  Arriving late affects you and other patients whose appointments are after yours.  Also, if you miss three or more appointments without notifying the office, you may be dismissed from the clinic at the providers discretion.      For prescription refill requests, have your pharmacy contact our office and allow 72 hours for refills to be completed.    Today you received the following chemotherapy and/or immunotherapy agents Kyprolis, Daratumumab      To help prevent nausea and vomiting after your treatment, we encourage you to take your nausea medication as directed.  BELOW ARE SYMPTOMS THAT SHOULD BE REPORTED IMMEDIATELY: *FEVER GREATER THAN 100.4 F (38 C) OR HIGHER *CHILLS OR SWEATING *NAUSEA AND VOMITING THAT IS NOT CONTROLLED WITH YOUR NAUSEA MEDICATION *UNUSUAL SHORTNESS OF BREATH *UNUSUAL BRUISING OR BLEEDING *URINARY PROBLEMS (pain or burning when urinating, or frequent urination) *BOWEL PROBLEMS (unusual diarrhea, constipation, pain near the anus) TENDERNESS IN MOUTH AND THROAT WITH OR WITHOUT PRESENCE OF ULCERS (sore throat, sores in mouth, or a toothache) UNUSUAL RASH, SWELLING OR PAIN  UNUSUAL VAGINAL DISCHARGE OR ITCHING   Items with * indicate a potential emergency and should be followed up as soon as possible or go to the Emergency Department if any problems should occur.  Please show the CHEMOTHERAPY ALERT CARD or IMMUNOTHERAPY ALERT CARD at  check-in to the Emergency Department and triage nurse.  Should you have questions after your visit or need to cancel or reschedule your appointment, please contact [facility]  Dept: [phone]  and follow the prompts.  Office hours are 8:00 a.m. to 4:30 p.m. Monday - Friday. Please note that voicemails left after 4:00 p.m. may not be returned until the following business day.  We are closed weekends and major holidays. You have access to a nurse at all times for urgent questions. Please call the main number to the clinic Dept: [phone] and follow the prompts.   For any non-urgent questions, you may also contact your provider using MyChart. We now offer e-Visits for anyone 61 and older to request care online for non-urgent symptoms. For details visit [email or web address].   Also download the MyChart app! Go to the app store, search "MyChart", open the app, select [facility], and log in with your MyChart username and password.  Due to Covid, a mask is required upon entering the hospital/clinic. If you do not have a mask, one will be given to you upon arrival. For doctor visits, patients may have 1 support person aged 45 or older with them. For treatment visits, patients cannot have anyone with them due to current Covid guidelines and our immunocompromised population.

## 2021-03-04 MED FILL — Dexamethasone Sodium Phosphate Inj 100 MG/10ML: INTRAMUSCULAR | Qty: 2 | Status: AC

## 2021-03-05 ENCOUNTER — Other Ambulatory Visit: Payer: Self-pay | Admitting: Oncology

## 2021-03-05 ENCOUNTER — Inpatient Hospital Stay: Payer: Medicare Other

## 2021-03-05 ENCOUNTER — Other Ambulatory Visit: Payer: Self-pay

## 2021-03-05 ENCOUNTER — Inpatient Hospital Stay: Payer: Medicare Other | Admitting: Dietician

## 2021-03-05 VITALS — BP 139/89 | HR 90 | Temp 98.8°F | Resp 18 | Ht 72.0 in | Wt 179.2 lb

## 2021-03-05 DIAGNOSIS — Z79899 Other long term (current) drug therapy: Secondary | ICD-10-CM | POA: Diagnosis not present

## 2021-03-05 DIAGNOSIS — C9000 Multiple myeloma not having achieved remission: Secondary | ICD-10-CM

## 2021-03-05 DIAGNOSIS — Z7982 Long term (current) use of aspirin: Secondary | ICD-10-CM | POA: Diagnosis not present

## 2021-03-05 DIAGNOSIS — D649 Anemia, unspecified: Secondary | ICD-10-CM | POA: Diagnosis not present

## 2021-03-05 DIAGNOSIS — G629 Polyneuropathy, unspecified: Secondary | ICD-10-CM | POA: Diagnosis not present

## 2021-03-05 DIAGNOSIS — Z5112 Encounter for antineoplastic immunotherapy: Secondary | ICD-10-CM | POA: Diagnosis not present

## 2021-03-05 LAB — CBC WITH DIFFERENTIAL (CANCER CENTER ONLY)
Abs Immature Granulocytes: 0.01 10*3/uL (ref 0.00–0.07)
Basophils Absolute: 0 10*3/uL (ref 0.0–0.1)
Basophils Relative: 0 %
Eosinophils Absolute: 0.1 10*3/uL (ref 0.0–0.5)
Eosinophils Relative: 2 %
HCT: 30.1 % — ABNORMAL LOW (ref 39.0–52.0)
Hemoglobin: 10.2 g/dL — ABNORMAL LOW (ref 13.0–17.0)
Immature Granulocytes: 0 %
Lymphocytes Relative: 12 %
Lymphs Abs: 0.6 10*3/uL — ABNORMAL LOW (ref 0.7–4.0)
MCH: 31.2 pg (ref 26.0–34.0)
MCHC: 33.9 g/dL (ref 30.0–36.0)
MCV: 92 fL (ref 80.0–100.0)
Monocytes Absolute: 0.7 10*3/uL (ref 0.1–1.0)
Monocytes Relative: 15 %
Neutro Abs: 3.3 10*3/uL (ref 1.7–7.7)
Neutrophils Relative %: 71 %
Platelet Count: 193 10*3/uL (ref 150–400)
RBC: 3.27 MIL/uL — ABNORMAL LOW (ref 4.22–5.81)
RDW: 18.2 % — ABNORMAL HIGH (ref 11.5–15.5)
WBC Count: 4.8 10*3/uL (ref 4.0–10.5)
nRBC: 0 % (ref 0.0–0.2)

## 2021-03-05 LAB — CMP (CANCER CENTER ONLY)
ALT: 10 U/L (ref 0–44)
AST: 10 U/L — ABNORMAL LOW (ref 15–41)
Albumin: 3.6 g/dL (ref 3.5–5.0)
Alkaline Phosphatase: 64 U/L (ref 38–126)
Anion gap: 5 (ref 5–15)
BUN: 10 mg/dL (ref 8–23)
CO2: 24 mmol/L (ref 22–32)
Calcium: 9 mg/dL (ref 8.9–10.3)
Chloride: 102 mmol/L (ref 98–111)
Creatinine: 0.84 mg/dL (ref 0.61–1.24)
GFR, Estimated: >60 mL/min (ref >60)
Glucose, Bld: 93 mg/dL (ref 70–99)
Potassium: 4.2 mmol/L (ref 3.5–5.1)
Sodium: 131 mmol/L — ABNORMAL LOW (ref 135–145)
Total Bilirubin: 0.8 mg/dL (ref 0.3–1.2)
Total Protein: 6.1 g/dL — ABNORMAL LOW (ref 6.5–8.1)

## 2021-03-05 MED ORDER — ACYCLOVIR 400 MG PO TABS: 400.0000 mg | ORAL_TABLET | Freq: Every day | ORAL | 3 refills | Status: DC

## 2021-03-05 MED ORDER — SODIUM CHLORIDE 0.9 % IV SOLN: Freq: Once | INTRAVENOUS | Status: AC

## 2021-03-05 MED ORDER — SODIUM CHLORIDE 0.9 % IV SOLN
20.0000 mg | Freq: Once | INTRAVENOUS | Status: AC
  Administered 2021-03-05: 20 mg via INTRAVENOUS
  Filled 2021-03-05: qty 20

## 2021-03-05 MED ORDER — DARATUMUMAB-HYALURONIDASE-FIHJ 1800-30000 MG-UT/15ML ~~LOC~~ SOLN
1800.0000 mg | Freq: Once | SUBCUTANEOUS | Status: AC
  Administered 2021-03-05: 1800 mg via SUBCUTANEOUS
  Filled 2021-03-05: qty 15

## 2021-03-05 MED ORDER — DEXTROSE 5 % IV SOLN
70.0000 mg/m2 | Freq: Once | INTRAVENOUS | Status: AC
  Administered 2021-03-05: 140 mg via INTRAVENOUS
  Filled 2021-03-05: qty 60

## 2021-03-05 MED ORDER — MONTELUKAST SODIUM 10 MG PO TABS
10.0000 mg | ORAL_TABLET | Freq: Once | ORAL | Status: AC
  Administered 2021-03-05: 10 mg via ORAL
  Filled 2021-03-05: qty 1

## 2021-03-05 MED ORDER — DIPHENHYDRAMINE HCL 25 MG PO CAPS
50.0000 mg | ORAL_CAPSULE | Freq: Once | ORAL | Status: AC
  Administered 2021-03-05: 50 mg via ORAL
  Filled 2021-03-05: qty 2

## 2021-03-05 MED ORDER — ACETAMINOPHEN 325 MG PO TABS
650.0000 mg | ORAL_TABLET | Freq: Once | ORAL | Status: AC
  Administered 2021-03-05: 650 mg via ORAL
  Filled 2021-03-05: qty 2

## 2021-03-05 NOTE — Patient Instructions (Signed)
[facility] ONCOLOGY  Discharge Instructions: Thank you for choosing [facility] to provide your oncology and hematology care.   If you have a lab appointment with the [facility], please go directly to the [facility] and check in at the registration area.   Wear comfortable clothing and clothing appropriate for easy access to any Portacath or PICC line.   We strive to give you quality time with your provider. You may need to reschedule your appointment if you arrive late (15 or more minutes).  Arriving late affects you and other patients whose appointments are after yours.  Also, if you miss three or more appointments without notifying the office, you may be dismissed from the clinic at the providers discretion.      For prescription refill requests, have your pharmacy contact our office and allow 72 hours for refills to be completed.    Today you received the following chemotherapy and/or immunotherapy agents: Kyprolis & Darzalex Faspro      To help prevent nausea and vomiting after your treatment, we encourage you to take your nausea medication as directed.  BELOW ARE SYMPTOMS THAT SHOULD BE REPORTED IMMEDIATELY: *FEVER GREATER THAN 100.4 F (38 C) OR HIGHER *CHILLS OR SWEATING *NAUSEA AND VOMITING THAT IS NOT CONTROLLED WITH YOUR NAUSEA MEDICATION *UNUSUAL SHORTNESS OF BREATH *UNUSUAL BRUISING OR BLEEDING *URINARY PROBLEMS (pain or burning when urinating, or frequent urination) *BOWEL PROBLEMS (unusual diarrhea, constipation, pain near the anus) TENDERNESS IN MOUTH AND THROAT WITH OR WITHOUT PRESENCE OF ULCERS (sore throat, sores in mouth, or a toothache) UNUSUAL RASH, SWELLING OR PAIN  UNUSUAL VAGINAL DISCHARGE OR ITCHING   Items with * indicate a potential emergency and should be followed up as soon as possible or go to the Emergency Department if any problems should occur.  Please show the CHEMOTHERAPY ALERT CARD or IMMUNOTHERAPY ALERT  CARD at check-in to the Emergency Department and triage nurse.  Should you have questions after your visit or need to cancel or reschedule your appointment, please contact [facility]  Dept: [phone]  and follow the prompts.  Office hours are 8:00 a.m. to 4:30 p.m. Monday - Friday. Please note that voicemails left after 4:00 p.m. may not be returned until the following business day.  We are closed weekends and major holidays. You have access to a nurse at all times for urgent questions. Please call the main number to the clinic Dept: [phone] and follow the prompts.   For any non-urgent questions, you may also contact your provider using MyChart. We now offer e-Visits for anyone 54 and older to request care online for non-urgent symptoms. For details visit [email or web address].   Also download the MyChart app! Go to the app store, search "MyChart", open the app, select [facility], and log in with your MyChart username and password.  Due to Covid, a mask is required upon entering the hospital/clinic. If you do not have a mask, one will be given to you upon arrival. For doctor visits, patients may have 1 support person aged 35 or older with them. For treatment visits, patients cannot have anyone with them due to current Covid guidelines and our immunocompromised population.

## 2021-03-05 NOTE — Progress Notes (Signed)
The Pt expressed complaints of not having a BM for over 5 days and was experiencing some abdominal discomfort.  RN made MD aware.

## 2021-03-05 NOTE — Progress Notes (Signed)
Nutrition Assessment  Reason for Assessment: MST   ASSESSMENT: 71 year old man with kappa light chain multiple myeloma diagnosed in 2018. S/P adjuvant radiation therapy to the thoracic spine to be completed on 10/14/2016.   Velcade 1.5 mg/m weekly with dexamethasone 20 mg and Cytoxan 600 mg po. Therapy started on 10/24/2016.    High-dose chemotherapy followed by stem cell transplant: Melphalan 200 mg/m2 completed on 06/17/17 followed by autologous stem cell rescue 06/18/17.  He achieved complete response.   Zometa 4 mg every 3 months started in February 2020.  Last treatment given in January 2022.  Revlimid 10 mg daily for 21 days out of a 28-day started in February 2020.  Therapy discontinued in November 2022 because of worsening anemia. Patient is currently receiving daratumumab and kyprolis.   Past medical history includes hypercalcemia, epidural mass, h/o autologous stem cell transplant.  Met with patient during infusion. He reports decreased appetite from baseline. Patient reports eating small frequent meals instead of his normal 3 meals/day. Patient reports altered taste. Patient is constipated, reports last bowel movement 5-6 days ago. MD is aware. Patient is not on bowel regimen.   Nutrition Focused Physical Exam: deferred    Medications: B-complex, dulcolax, D3, flexeril, gabapentin, Mg, MVI   Labs: Na 131   Anthropometrics: Weights have decreased 2.7% in 3 weeks. Patient weighed 184 lb 6.4 oz on 1/09  Height: 6' Weight: 179 lb 4 oz UBW: 185 lb (01/24/21) BMI: 24.31   NUTRITION DIAGNOSIS: Inadequate oral intake related to cancer treatment side effects as evidenced by reported decreased appetite from baseline, altered taste of foods, constipation   INTERVENTION:  Educated on importance of adequate calorie and protein energy intake to maintain weights/strength Continue eating small frequent meals and snacks with good sources of protein - handout with ideas  provided Discussed strategies for altered taste - handout with tips provided  Discussed strategies for constipation - suggested pt try 4 oz prune juice  Bowel regimen per MD Contact information provided    MONITORING, EVALUATION, GOAL: Patient will tolerate increased calories and protein to minimize weight loss    Next Visit: Tuesday February 21 during infusion with Pamala Hurry

## 2021-03-07 ENCOUNTER — Encounter: Payer: Self-pay | Admitting: Oncology

## 2021-03-07 ENCOUNTER — Telehealth: Payer: Self-pay | Admitting: Oncology

## 2021-03-07 NOTE — Telephone Encounter (Signed)
Scheduled per 01/09 los, patient has been called and notified. °

## 2021-03-12 ENCOUNTER — Other Ambulatory Visit: Payer: Self-pay | Admitting: Oncology

## 2021-03-12 ENCOUNTER — Other Ambulatory Visit: Payer: Self-pay

## 2021-03-12 ENCOUNTER — Inpatient Hospital Stay: Payer: Medicare Other

## 2021-03-12 ENCOUNTER — Inpatient Hospital Stay: Payer: Medicare Other | Attending: Oncology

## 2021-03-12 VITALS — BP 150/90 | HR 100 | Temp 98.1°F | Resp 16 | Wt 174.0 lb

## 2021-03-12 DIAGNOSIS — D630 Anemia in neoplastic disease: Secondary | ICD-10-CM | POA: Diagnosis not present

## 2021-03-12 DIAGNOSIS — G629 Polyneuropathy, unspecified: Secondary | ICD-10-CM | POA: Insufficient documentation

## 2021-03-12 DIAGNOSIS — C9002 Multiple myeloma in relapse: Secondary | ICD-10-CM | POA: Diagnosis not present

## 2021-03-12 DIAGNOSIS — Z5112 Encounter for antineoplastic immunotherapy: Secondary | ICD-10-CM | POA: Insufficient documentation

## 2021-03-12 DIAGNOSIS — C9000 Multiple myeloma not having achieved remission: Secondary | ICD-10-CM

## 2021-03-12 LAB — CMP (CANCER CENTER ONLY)
ALT: 6 U/L (ref 0–44)
AST: 8 U/L — ABNORMAL LOW (ref 15–41)
Albumin: 3.8 g/dL (ref 3.5–5.0)
Alkaline Phosphatase: 76 U/L (ref 38–126)
Anion gap: 8 (ref 5–15)
BUN: 8 mg/dL (ref 8–23)
CO2: 22 mmol/L (ref 22–32)
Calcium: 8.7 mg/dL — ABNORMAL LOW (ref 8.9–10.3)
Chloride: 102 mmol/L (ref 98–111)
Creatinine: 0.77 mg/dL (ref 0.61–1.24)
GFR, Estimated: >60 mL/min (ref >60)
Glucose, Bld: 97 mg/dL (ref 70–99)
Potassium: 4.2 mmol/L (ref 3.5–5.1)
Sodium: 132 mmol/L — ABNORMAL LOW (ref 135–145)
Total Bilirubin: 1 mg/dL (ref 0.3–1.2)
Total Protein: 6.2 g/dL — ABNORMAL LOW (ref 6.5–8.1)

## 2021-03-12 LAB — CBC WITH DIFFERENTIAL (CANCER CENTER ONLY)
Abs Immature Granulocytes: 0.02 10*3/uL (ref 0.00–0.07)
Basophils Absolute: 0 10*3/uL (ref 0.0–0.1)
Basophils Relative: 0 %
Eosinophils Absolute: 0 10*3/uL (ref 0.0–0.5)
Eosinophils Relative: 1 %
HCT: 30.2 % — ABNORMAL LOW (ref 39.0–52.0)
Hemoglobin: 10.3 g/dL — ABNORMAL LOW (ref 13.0–17.0)
Immature Granulocytes: 1 %
Lymphocytes Relative: 12 %
Lymphs Abs: 0.5 10*3/uL — ABNORMAL LOW (ref 0.7–4.0)
MCH: 31.3 pg (ref 26.0–34.0)
MCHC: 34.1 g/dL (ref 30.0–36.0)
MCV: 91.8 fL (ref 80.0–100.0)
Monocytes Absolute: 0.7 10*3/uL (ref 0.1–1.0)
Monocytes Relative: 16 %
Neutro Abs: 3.1 10*3/uL (ref 1.7–7.7)
Neutrophils Relative %: 70 %
Platelet Count: 192 10*3/uL (ref 150–400)
RBC: 3.29 MIL/uL — ABNORMAL LOW (ref 4.22–5.81)
RDW: 19.1 % — ABNORMAL HIGH (ref 11.5–15.5)
WBC Count: 4.4 10*3/uL (ref 4.0–10.5)
nRBC: 0 % (ref 0.0–0.2)

## 2021-03-12 MED ORDER — DIPHENHYDRAMINE HCL 25 MG PO CAPS
50.0000 mg | ORAL_CAPSULE | Freq: Once | ORAL | Status: AC
  Administered 2021-03-12: 50 mg via ORAL
  Filled 2021-03-12: qty 2

## 2021-03-12 MED ORDER — DARATUMUMAB-HYALURONIDASE-FIHJ 1800-30000 MG-UT/15ML ~~LOC~~ SOLN
1800.0000 mg | Freq: Once | SUBCUTANEOUS | Status: AC
  Administered 2021-03-12: 1800 mg via SUBCUTANEOUS
  Filled 2021-03-12: qty 15

## 2021-03-12 MED ORDER — DEXAMETHASONE 4 MG PO TABS
20.0000 mg | ORAL_TABLET | Freq: Once | ORAL | Status: AC
  Administered 2021-03-12: 20 mg via ORAL
  Filled 2021-03-12: qty 5

## 2021-03-12 MED ORDER — TRAMADOL HCL 50 MG PO TABS: 50.0000 mg | ORAL_TABLET | Freq: Four times a day (QID) | ORAL | 1 refills | Status: DC | PRN

## 2021-03-12 MED ORDER — ACETAMINOPHEN 325 MG PO TABS
650.0000 mg | ORAL_TABLET | Freq: Once | ORAL | Status: AC
  Administered 2021-03-12: 650 mg via ORAL
  Filled 2021-03-12: qty 2

## 2021-03-12 NOTE — Patient Instructions (Signed)
[facility]  Discharge Instructions: Thank you for choosing [facility] to provide your oncology and hematology care.   If you have a lab appointment with the [facility], please go directly to the [facility] and check in at the registration area.   Wear comfortable clothing and clothing appropriate for easy access to any Portacath or PICC line.   We strive to give you quality time with your provider. You may need to reschedule your appointment if you arrive late (15 or more minutes).  Arriving late affects you and other patients whose appointments are after yours.  Also, if you miss three or more appointments without notifying the office, you may be dismissed from the clinic at the providers discretion.      For prescription refill requests, have your pharmacy contact our office and allow 72 hours for refills to be completed.    Today you received the following chemotherapy and/or immunotherapy agent: Daratumumab (Darzalex Faspro)   To help prevent nausea and vomiting after your treatment, we encourage you to take your nausea medication as directed.  BELOW ARE SYMPTOMS THAT SHOULD BE REPORTED IMMEDIATELY: *FEVER GREATER THAN 100.4 F (38 C) OR HIGHER *CHILLS OR SWEATING *NAUSEA AND VOMITING THAT IS NOT CONTROLLED WITH YOUR NAUSEA MEDICATION *UNUSUAL SHORTNESS OF BREATH *UNUSUAL BRUISING OR BLEEDING *URINARY PROBLEMS (pain or burning when urinating, or frequent urination) *BOWEL PROBLEMS (unusual diarrhea, constipation, pain near the anus) TENDERNESS IN MOUTH AND THROAT WITH OR WITHOUT PRESENCE OF ULCERS (sore throat, sores in mouth, or a toothache) UNUSUAL RASH, SWELLING OR PAIN  UNUSUAL VAGINAL DISCHARGE OR ITCHING   Items with * indicate a potential emergency and should be followed up as soon as possible or go to the Emergency Department if any problems should occur.  Please show the CHEMOTHERAPY ALERT CARD or IMMUNOTHERAPY ALERT CARD  at check-in to the Emergency Department and triage nurse.  Should you have questions after your visit or need to cancel or reschedule your appointment, please contact [facility]  Dept: [phone]  and follow the prompts.  Office hours are 8:00 a.m. to 4:30 p.m. Monday - Friday. Please note that voicemails left after 4:00 p.m. may not be returned until the following business day.  We are closed weekends and major holidays. You have access to a nurse at all times for urgent questions. Please call the main number to the clinic Dept: [phone] and follow the prompts.   For any non-urgent questions, you may also contact your provider using MyChart. We now offer e-Visits for anyone 72 and older to request care online for non-urgent symptoms. For details visit [email or web address].   Also download the MyChart app! Go to the app store, search "MyChart", open the app, select [facility], and log in with your MyChart username and password.  Due to Covid, a mask is required upon entering the hospital/clinic. If you do not have a mask, one will be given to you upon arrival. For doctor visits, patients may have 1 support person aged 25 or older with them. For treatment visits, patients cannot have anyone with them due to current Covid guidelines and our immunocompromised population.

## 2021-03-13 LAB — KAPPA/LAMBDA LIGHT CHAINS
Kappa free light chain: 4.9 mg/L (ref 3.3–19.4)
Kappa, lambda light chain ratio: 1.53 (ref 0.26–1.65)
Lambda free light chains: 3.2 mg/L — ABNORMAL LOW (ref 5.7–26.3)

## 2021-03-18 ENCOUNTER — Telehealth: Payer: Self-pay | Admitting: Oncology

## 2021-03-18 LAB — MULTIPLE MYELOMA PANEL, SERUM
Albumin SerPl Elph-Mcnc: 3.3 g/dL (ref 2.9–4.4)
Albumin/Glob SerPl: 1.3 (ref 0.7–1.7)
Alpha 1: 0.3 g/dL (ref 0.0–0.4)
Alpha2 Glob SerPl Elph-Mcnc: 0.8 g/dL (ref 0.4–1.0)
B-Globulin SerPl Elph-Mcnc: 0.9 g/dL (ref 0.7–1.3)
Gamma Glob SerPl Elph-Mcnc: 0.7 g/dL (ref 0.4–1.8)
Globulin, Total: 2.6 g/dL (ref 2.2–3.9)
IgA: 15 mg/dL — ABNORMAL LOW (ref 61–437)
IgG (Immunoglobin G), Serum: 762 mg/dL (ref 603–1613)
IgM (Immunoglobulin M), Srm: 8 mg/dL — ABNORMAL LOW (ref 15–143)
M Protein SerPl Elph-Mcnc: 0.1 g/dL — ABNORMAL HIGH
Total Protein ELP: 5.9 g/dL — ABNORMAL LOW (ref 6.0–8.5)

## 2021-03-18 MED FILL — Dexamethasone Sodium Phosphate Inj 100 MG/10ML: INTRAMUSCULAR | Qty: 2 | Status: AC

## 2021-03-18 NOTE — Telephone Encounter (Signed)
Sch per 2/13 lab req to r/s, left pt message, ok per md

## 2021-03-19 ENCOUNTER — Inpatient Hospital Stay: Payer: Medicare Other

## 2021-03-19 ENCOUNTER — Other Ambulatory Visit: Payer: Self-pay

## 2021-03-19 ENCOUNTER — Inpatient Hospital Stay (HOSPITAL_BASED_OUTPATIENT_CLINIC_OR_DEPARTMENT_OTHER): Payer: Medicare Other | Admitting: Oncology

## 2021-03-19 VITALS — HR 92

## 2021-03-19 VITALS — BP 118/76 | HR 103 | Temp 98.1°F | Resp 15 | Ht 72.0 in | Wt 172.1 lb

## 2021-03-19 DIAGNOSIS — Z5112 Encounter for antineoplastic immunotherapy: Secondary | ICD-10-CM | POA: Diagnosis not present

## 2021-03-19 DIAGNOSIS — C9000 Multiple myeloma not having achieved remission: Secondary | ICD-10-CM

## 2021-03-19 DIAGNOSIS — G629 Polyneuropathy, unspecified: Secondary | ICD-10-CM | POA: Diagnosis not present

## 2021-03-19 DIAGNOSIS — D630 Anemia in neoplastic disease: Secondary | ICD-10-CM | POA: Diagnosis not present

## 2021-03-19 DIAGNOSIS — C9002 Multiple myeloma in relapse: Secondary | ICD-10-CM | POA: Diagnosis not present

## 2021-03-19 LAB — CMP (CANCER CENTER ONLY)
ALT: 7 U/L (ref 0–44)
AST: 7 U/L — ABNORMAL LOW (ref 15–41)
Albumin: 4 g/dL (ref 3.5–5.0)
Alkaline Phosphatase: 74 U/L (ref 38–126)
Anion gap: 6 (ref 5–15)
BUN: 9 mg/dL (ref 8–23)
CO2: 24 mmol/L (ref 22–32)
Calcium: 9.1 mg/dL (ref 8.9–10.3)
Chloride: 106 mmol/L (ref 98–111)
Creatinine: 0.74 mg/dL (ref 0.61–1.24)
GFR, Estimated: >60 mL/min (ref >60)
Glucose, Bld: 98 mg/dL (ref 70–99)
Potassium: 3.9 mmol/L (ref 3.5–5.1)
Sodium: 136 mmol/L (ref 135–145)
Total Bilirubin: 0.6 mg/dL (ref 0.3–1.2)
Total Protein: 6.2 g/dL — ABNORMAL LOW (ref 6.5–8.1)

## 2021-03-19 LAB — CBC WITH DIFFERENTIAL (CANCER CENTER ONLY)
Abs Immature Granulocytes: 0.01 10*3/uL (ref 0.00–0.07)
Basophils Absolute: 0 10*3/uL (ref 0.0–0.1)
Basophils Relative: 1 %
Eosinophils Absolute: 0.1 10*3/uL (ref 0.0–0.5)
Eosinophils Relative: 2 %
HCT: 31.8 % — ABNORMAL LOW (ref 39.0–52.0)
Hemoglobin: 10.6 g/dL — ABNORMAL LOW (ref 13.0–17.0)
Immature Granulocytes: 0 %
Lymphocytes Relative: 15 %
Lymphs Abs: 0.7 10*3/uL (ref 0.7–4.0)
MCH: 31.5 pg (ref 26.0–34.0)
MCHC: 33.3 g/dL (ref 30.0–36.0)
MCV: 94.4 fL (ref 80.0–100.0)
Monocytes Absolute: 0.6 10*3/uL (ref 0.1–1.0)
Monocytes Relative: 13 %
Neutro Abs: 3.4 10*3/uL (ref 1.7–7.7)
Neutrophils Relative %: 69 %
Platelet Count: 376 10*3/uL (ref 150–400)
RBC: 3.37 MIL/uL — ABNORMAL LOW (ref 4.22–5.81)
RDW: 19.6 % — ABNORMAL HIGH (ref 11.5–15.5)
WBC Count: 4.8 10*3/uL (ref 4.0–10.5)
nRBC: 0 % (ref 0.0–0.2)

## 2021-03-19 MED ORDER — SODIUM CHLORIDE 0.9 % IV SOLN
20.0000 mg | Freq: Once | INTRAVENOUS | Status: AC
  Administered 2021-03-19: 20 mg via INTRAVENOUS
  Filled 2021-03-19: qty 20

## 2021-03-19 MED ORDER — DIPHENHYDRAMINE HCL 25 MG PO CAPS
50.0000 mg | ORAL_CAPSULE | Freq: Once | ORAL | Status: AC
  Administered 2021-03-19: 50 mg via ORAL
  Filled 2021-03-19: qty 2

## 2021-03-19 MED ORDER — DEXTROSE 5 % IV SOLN
70.0000 mg/m2 | Freq: Once | INTRAVENOUS | Status: AC
  Administered 2021-03-19: 140 mg via INTRAVENOUS
  Filled 2021-03-19: qty 60

## 2021-03-19 MED ORDER — ACETAMINOPHEN 325 MG PO TABS
650.0000 mg | ORAL_TABLET | Freq: Once | ORAL | Status: AC
  Administered 2021-03-19: 650 mg via ORAL
  Filled 2021-03-19: qty 2

## 2021-03-19 MED ORDER — SODIUM CHLORIDE 0.9 % IV SOLN: Freq: Once | INTRAVENOUS | Status: AC

## 2021-03-19 MED ORDER — DARATUMUMAB-HYALURONIDASE-FIHJ 1800-30000 MG-UT/15ML ~~LOC~~ SOLN
1800.0000 mg | Freq: Once | SUBCUTANEOUS | Status: AC
  Administered 2021-03-19: 1800 mg via SUBCUTANEOUS
  Filled 2021-03-19: qty 15

## 2021-03-19 NOTE — Progress Notes (Signed)
Hematology and Oncology Follow Up Visit  [facility] [id number] 1950/06/01 71 y.o. 03/19/2021 8:16 AM [name], [name] MDOsei-Bonsu, [name], MD   Principle Diagnosis: 71 year old man with multiple myeloma diagnosed in 2018 after presenting with epidural mass and kappa light chain bone marrow involvement.  He developed relapsed disease in December 2022 with worsening anemia and increased bone marrow involvement.  Prior Therapy:  He is status post surgical decompression of an epidural mass on 09/04/2016 and the pathology showed a plasma cell neoplasm.  He is S/P adjuvant radiation therapy to the thoracic spine to be completed on 10/14/2016.  Velcade 1.5 mg/m weekly with dexamethasone 20 mg and Cytoxan 600 mg po. Therapy started on 10/24/2016.   High-dose chemotherapy followed by stem cell transplant: Melphalan 200 mg/m2 completed on 06/17/17 followed by autologous stem cell rescue 06/18/17.  He achieved complete response.   Zometa 4 mg every 3 months started in February 2020.  Last treatment given in January 2022.   Revlimid 10 mg daily for 21 days out of a 28-day started in February 2020.  Therapy discontinued in November 2022 because of worsening anemia.  Current therapy:   Carfilzomib 20 mg/m weekly started on February 19, 2021 Daratumumab 1800 mg subcutaneous injection weekly started on February 19, 2021 Dexamethasone weekly started on February 19, 2021.     Interim History:  [facility] returns today for a follow-up visit.  Since the last visit, he started salvage therapy for his relapsed multiple myeloma.  He tolerated the 4 weeks of therapy without any major complications.  He denies any nausea, vomiting or abdominal pain.  He denies any worsening neuropathy or fatigue.  He has reported some constipation.  He is eating well although it lost weight.  Denies any hospitalizations or illnesses.  His performance status and quality of life remains unchanged.     Medications:  Reviewed without changes Current Outpatient Medications  Medication Sig Dispense Refill   acyclovir (ZOVIRAX) 400 MG tablet Take 1 tablet (400 mg total) by mouth daily. 90 tablet 3   aspirin EC 81 MG tablet Take 81 mg by mouth daily.     B Complex Vitamins (B-COMPLEX/B-12) TABS Take 1 tablet by mouth daily.     bisacodyl (DULCOLAX) 5 MG EC tablet Take 5 mg by mouth daily as needed for moderate constipation.     carboxymethylcellulose (REFRESH PLUS) 0.5 % SOLN Place 1 drop into both eyes 3 (three) times daily as needed (dry eyes).     cholecalciferol (VITAMIN D3) 25 MCG (1000 UNIT) tablet Take 1,000 Units by mouth daily.     cyclobenzaprine (FLEXERIL) 5 MG tablet Take 1 tablet (5 mg total) by mouth at bedtime as needed for muscle spasms. 30 tablet 1   diphenhydramine-acetaminophen (TYLENOL PM EXTRA STRENGTH) 25-500 MG TABS tablet Take 1 tablet by mouth at bedtime as needed (pain).     gabapentin (NEURONTIN) 300 MG capsule TAKE 3 CAPSULE BY MOUTH THREE TIMES DAILY. 3 CAPSULE IN THE MORNING 3 CAPSULE IN THE AFTERNOON AND 3 CAPSULE AT NIGHT 270 capsule 3   losartan (COZAAR) 25 MG tablet Take 25 mg by mouth daily.     Magnesium 100 MG TABS Take 100 mg by mouth daily.     Multiple Vitamin (MULTIVITAMIN) capsule Take 1 capsule by mouth daily.     REVLIMID 10 MG capsule TAKE 1 CAPSULE ONCE DAILY FOR 21 DAYS, THEN 7 DAYS OFF. REPEAT EVERY 28 DAYS 21 capsule 0   sodium chloride (OCEAN) 0.65 % SOLN  nasal spray Place 1 spray into both nostrils daily as needed for congestion.     traMADol (ULTRAM) 50 MG tablet Take 1 tablet (50 mg total) by mouth every 6 (six) hours as needed. 30 tablet 1   No current facility-administered medications for this visit.     Allergies: No Known Allergies    Physical Exam:     Blood pressure 118/76, pulse (!) 103, temperature 98.1 F (36.7 C), temperature source Temporal, resp. rate 15, height 6' (1.829 m), weight 172 lb 1.6 oz (78.1 kg), SpO2 98  %.          ECOG: 1   General appearance: Alert, awake without any distress. Head: Atraumatic without abnormalities Oropharynx: Without any thrush or ulcers. Eyes: No scleral icterus. Lymph nodes: No lymphadenopathy noted in the cervical, supraclavicular, or axillary nodes Heart:regular rate and rhythm, without any murmurs or gallops.   Lung: Clear to auscultation without any rhonchi, wheezes or dullness to percussion. Abdomin: Soft, nontender without any shifting dullness or ascites. Musculoskeletal: No clubbing or cyanosis. Neurological: No motor or sensory deficits. Skin: No rashes or lesions.            Lab Results: Lab Results  Component Value Date   WBC 4.4 03/12/2021   HGB 10.3 (L) 03/12/2021   HCT 30.2 (L) 03/12/2021   MCV 91.8 03/12/2021   PLT 192 03/12/2021     Chemistry      Component Value Date/Time   NA 132 (L) 03/12/2021 1324   NA 137 02/06/2017 0828   K 4.2 03/12/2021 1324   K 4.0 02/06/2017 0828   CL 102 03/12/2021 1324   CO2 22 03/12/2021 1324   CO2 24 02/06/2017 0828   BUN 8 03/12/2021 1324   BUN 14.9 02/06/2017 0828   CREATININE 0.77 03/12/2021 1324   CREATININE 0.9 02/06/2017 0828      Component Value Date/Time   CALCIUM 8.7 (L) 03/12/2021 1324   CALCIUM 9.1 02/06/2017 0828   ALKPHOS 76 03/12/2021 1324   ALKPHOS 31 (L) 02/06/2017 0828   AST 8 (L) 03/12/2021 1324   AST 32 02/06/2017 0828   ALT 6 03/12/2021 1324   ALT 43 02/06/2017 0828   BILITOT 1.0 03/12/2021 1324   BILITOT 0.42 02/06/2017 0828     (H): Data is abnormally high   Latest Reference Range & Units 01/07/21 11:17 03/12/21 13:25  M Protein SerPl Elph-Mcnc Not Observed g/dL Not Observed (C) 0.1 (H) (C)  IFE 1  Comment (C) Comment ! (C)  Globulin, Total 2.2 - 3.9 g/dL 3.1 (C) 2.6 (C)  B-Globulin SerPl Elph-Mcnc 0.7 - 1.3 g/dL 0.9 (C) 0.9 (C)  IgG (Immunoglobin G), Serum 603 - 1,613 mg/dL 1,220 762  IgM (Immunoglobulin M), Srm 15 - 143 mg/dL 9 (L) 8 (L)  IgA  61 - 437 mg/dL 62 15 (L)     Latest Reference Range & Units 01/07/21 11:17 03/12/21 13:24  Kappa free light chain 3.3 - 19.4 mg/L 127.6 (H) 4.9  Lambda free light chains 5.7 - 26.3 mg/L 13.9 3.2 (L)  Kappa, lambda light chain ratio 0.26 - 1.65  9.18 (H) 1.53  (H): Data is abnormally high (L): Data is abnormally low   Impression and Plan:  71 year old man with:   1.  Relapsed multiple myeloma documented in December 2022.  He was initially diagnosed in 2018 with Kappa light chain.  He is currently receiving salvage therapy outlined above and has tolerated it very well.  Risks and benefits of  continuing this treatment were discussed at this time.  Complications that include nausea, vomiting, myelosuppression, neutropenia and possible sepsis were reiterated.  Protein studies reviewed on March 12, 2021 and showed an excellent response with normalization of his kappa to lambda ratio.  At this time he is agreeable to continue.  2.  Bone health: We will continue to hold off on additional Zometa for the time being.   3.  Neuropathy: He is currently on gabapentin with manageable complaints.  4.  VZV prophylaxis: He is currently on acyclovir without any reactivation.  5.  Anemia: He received 2 units of packed red cell transfusion on February 18, 2021.  Hemoglobin remained stable.  His anemia is related to plasma cell disorder.  6.  IV access: No issues reported with peripheral veins at this time.  7.  Antiemetics: Compazine is available to him without any nausea or vomiting.  8. Follow-up: He will continue to receive weekly treatment and MD follow-up in 4 weeks.  30 minutes were spent on this encounter.  The time was dedicated to reviewing laboratory data, disease status update and addressing complication related to cancer and cancer therapy.   Zola Button, MD 2/14/20238:16 AM

## 2021-03-19 NOTE — Patient Instructions (Addendum)
[facility] ONCOLOG[facility]  Discharge Instructions: Thank you for choosing [facility] to provide your oncology and hematology care.   If you have a lab appointment with the [facility], please go directly to the [facility] and check in at the registration area.   Wear comfortable clothing and clothing appropriate for easy access to any Portacath or PICC line.   We strive to give you quality time with your provider. You may need to reschedule your appointment if you arrive late (15 or more minutes).  Arriving late affects you and other patients whose appointments are after yours.  Also, if you miss three or more appointments without notifying the office, you may be dismissed from the clinic at the providers discretion.      For prescription refill requests, have your pharmacy contact our office and allow 72 hours for refills to be completed.    Today you received the following chemotherapy and/or immunotherapy agents: kyprolis and darzalex faspro   To help prevent nausea and vomiting after your treatment, we encourage you to take your nausea medication as directed.  BELOW ARE SYMPTOMS THAT SHOULD BE REPORTED IMMEDIATELY: *FEVER GREATER THAN 100.4 F (38 C) OR HIGHER *CHILLS OR SWEATING *NAUSEA AND VOMITING THAT IS NOT CONTROLLED WITH YOUR NAUSEA MEDICATION *UNUSUAL SHORTNESS OF BREATH *UNUSUAL BRUISING OR BLEEDING *URINARY PROBLEMS (pain or burning when urinating, or frequent urination) *BOWEL PROBLEMS (unusual diarrhea, constipation, pain near the anus) TENDERNESS IN MOUTH AND THROAT WITH OR WITHOUT PRESENCE OF ULCERS (sore throat, sores in mouth, or a toothache) UNUSUAL RASH, SWELLING OR PAIN  UNUSUAL VAGINAL DISCHARGE OR ITCHING   Items with * indicate a potential emergency and should be followed up as soon as possible or go to the Emergency Department if any problems should occur.  Please show the CHEMOTHERAPY ALERT CARD or IMMUNOTHERAPY ALERT CARD  at check-in to the Emergency Department and triage nurse.  Should you have questions after your visit or need to cancel or reschedule your appointment, please contact [facility]  Dept: [phone]  and follow the prompts.  Office hours are 8:00 a.m. to 4:30 p.m. Monday - Friday. Please note that voicemails left after 4:00 p.m. may not be returned until the following business day.  We are closed weekends and major holidays. You have access to a nurse at all times for urgent questions. Please call the main number to the clinic Dept: [phone] and follow the prompts.   For any non-urgent questions, you may also contact your provider using MyChart. We now offer e-Visits for anyone 72 and older to request care online for non-urgent symptoms. For details visit [email or web address].   Also download the MyChart app! Go to the app store, search "MyChart", open the app, select [facility], and log in with your MyChart username and password.  Due to Covid, a mask is required upon entering the hospital/clinic. If you do not have a mask, one will be given to you upon arrival. For doctor visits, patients may have 1 support person aged 72 or older with them. For treatment visits, patients cannot have anyone with them due to current Covid guidelines and our immunocompromised population.

## 2021-03-25 ENCOUNTER — Telehealth: Payer: Self-pay

## 2021-03-25 MED FILL — Dexamethasone Sodium Phosphate Inj 100 MG/10ML: INTRAMUSCULAR | Qty: 2 | Status: AC

## 2021-03-25 NOTE — Telephone Encounter (Signed)
Patient called to say that he attempted to get Tramadol filled at [facility] on [address] and they do not have a current prescription on file. I confirmed this with pharmacist.   Reviewed bowel regimen with pt.

## 2021-03-26 ENCOUNTER — Inpatient Hospital Stay: Payer: Medicare Other

## 2021-03-26 ENCOUNTER — Other Ambulatory Visit: Payer: Self-pay

## 2021-03-26 ENCOUNTER — Inpatient Hospital Stay: Payer: Medicare Other | Admitting: Nutrition

## 2021-03-26 VITALS — BP 137/85 | HR 96 | Temp 97.8°F | Resp 19 | Wt 172.0 lb

## 2021-03-26 DIAGNOSIS — C9002 Multiple myeloma in relapse: Secondary | ICD-10-CM | POA: Diagnosis not present

## 2021-03-26 DIAGNOSIS — C9000 Multiple myeloma not having achieved remission: Secondary | ICD-10-CM

## 2021-03-26 DIAGNOSIS — G629 Polyneuropathy, unspecified: Secondary | ICD-10-CM | POA: Diagnosis not present

## 2021-03-26 DIAGNOSIS — Z5112 Encounter for antineoplastic immunotherapy: Secondary | ICD-10-CM | POA: Diagnosis not present

## 2021-03-26 DIAGNOSIS — D630 Anemia in neoplastic disease: Secondary | ICD-10-CM | POA: Diagnosis not present

## 2021-03-26 LAB — CMP (CANCER CENTER ONLY)
ALT: 7 U/L (ref 0–44)
AST: 10 U/L — ABNORMAL LOW (ref 15–41)
Albumin: 4.1 g/dL (ref 3.5–5.0)
Alkaline Phosphatase: 56 U/L (ref 38–126)
Anion gap: 7 (ref 5–15)
BUN: 12 mg/dL (ref 8–23)
CO2: 24 mmol/L (ref 22–32)
Calcium: 9.1 mg/dL (ref 8.9–10.3)
Chloride: 108 mmol/L (ref 98–111)
Creatinine: 0.79 mg/dL (ref 0.61–1.24)
GFR, Estimated: >60 mL/min (ref >60)
Glucose, Bld: 95 mg/dL (ref 70–99)
Potassium: 3.7 mmol/L (ref 3.5–5.1)
Sodium: 139 mmol/L (ref 135–145)
Total Bilirubin: 0.9 mg/dL (ref 0.3–1.2)
Total Protein: 6.4 g/dL — ABNORMAL LOW (ref 6.5–8.1)

## 2021-03-26 LAB — CBC WITH DIFFERENTIAL (CANCER CENTER ONLY)
Abs Immature Granulocytes: 0.02 10*3/uL (ref 0.00–0.07)
Basophils Absolute: 0 10*3/uL (ref 0.0–0.1)
Basophils Relative: 0 %
Eosinophils Absolute: 0.1 10*3/uL (ref 0.0–0.5)
Eosinophils Relative: 1 %
HCT: 31.8 % — ABNORMAL LOW (ref 39.0–52.0)
Hemoglobin: 10.5 g/dL — ABNORMAL LOW (ref 13.0–17.0)
Immature Granulocytes: 0 %
Lymphocytes Relative: 10 %
Lymphs Abs: 0.6 10*3/uL — ABNORMAL LOW (ref 0.7–4.0)
MCH: 31.5 pg (ref 26.0–34.0)
MCHC: 33 g/dL (ref 30.0–36.0)
MCV: 95.5 fL (ref 80.0–100.0)
Monocytes Absolute: 1 10*3/uL (ref 0.1–1.0)
Monocytes Relative: 18 %
Neutro Abs: 3.9 10*3/uL (ref 1.7–7.7)
Neutrophils Relative %: 71 %
Platelet Count: 167 10*3/uL (ref 150–400)
RBC: 3.33 MIL/uL — ABNORMAL LOW (ref 4.22–5.81)
RDW: 19.6 % — ABNORMAL HIGH (ref 11.5–15.5)
WBC Count: 5.7 10*3/uL (ref 4.0–10.5)
nRBC: 0 % (ref 0.0–0.2)

## 2021-03-26 MED ORDER — SODIUM CHLORIDE 0.9 % IV SOLN: Freq: Once | INTRAVENOUS | Status: AC

## 2021-03-26 MED ORDER — SODIUM CHLORIDE 0.9 % IV SOLN
20.0000 mg | Freq: Once | INTRAVENOUS | Status: AC
  Administered 2021-03-26: 20 mg via INTRAVENOUS
  Filled 2021-03-26: qty 20

## 2021-03-26 MED ORDER — ACETAMINOPHEN 325 MG PO TABS
650.0000 mg | ORAL_TABLET | Freq: Once | ORAL | Status: AC
  Administered 2021-03-26: 650 mg via ORAL
  Filled 2021-03-26: qty 2

## 2021-03-26 MED ORDER — DEXTROSE 5 % IV SOLN
70.0000 mg/m2 | Freq: Once | INTRAVENOUS | Status: AC
  Administered 2021-03-26: 140 mg via INTRAVENOUS
  Filled 2021-03-26: qty 60

## 2021-03-26 MED ORDER — DARATUMUMAB-HYALURONIDASE-FIHJ 1800-30000 MG-UT/15ML ~~LOC~~ SOLN
1800.0000 mg | Freq: Once | SUBCUTANEOUS | Status: AC
  Administered 2021-03-26: 1800 mg via SUBCUTANEOUS
  Filled 2021-03-26: qty 15

## 2021-03-26 MED ORDER — DIPHENHYDRAMINE HCL 25 MG PO CAPS
50.0000 mg | ORAL_CAPSULE | Freq: Once | ORAL | Status: AC
  Administered 2021-03-26: 50 mg via ORAL
  Filled 2021-03-26: qty 2

## 2021-03-26 NOTE — Patient Instructions (Signed)
[facility] ONCOLOGY  Discharge Instructions: Thank you for choosing [facility] to provide your oncology and hematology care.   If you have a lab appointment with the [facility], please go directly to the [facility] and check in at the registration area.   Wear comfortable clothing and clothing appropriate for easy access to any Portacath or PICC line.   We strive to give you quality time with your provider. You may need to reschedule your appointment if you arrive late (15 or more minutes).  Arriving late affects you and other patients whose appointments are after yours.  Also, if you miss three or more appointments without notifying the office, you may be dismissed from the clinic at the providers discretion.      For prescription refill requests, have your pharmacy contact our office and allow 72 hours for refills to be completed.    Today you received the following chemotherapy and/or immunotherapy agents: kyprolis and darzalex faspro   To help prevent nausea and vomiting after your treatment, we encourage you to take your nausea medication as directed.  BELOW ARE SYMPTOMS THAT SHOULD BE REPORTED IMMEDIATELY: *FEVER GREATER THAN 100.4 F (38 C) OR HIGHER *CHILLS OR SWEATING *NAUSEA AND VOMITING THAT IS NOT CONTROLLED WITH YOUR NAUSEA MEDICATION *UNUSUAL SHORTNESS OF BREATH *UNUSUAL BRUISING OR BLEEDING *URINARY PROBLEMS (pain or burning when urinating, or frequent urination) *BOWEL PROBLEMS (unusual diarrhea, constipation, pain near the anus) TENDERNESS IN MOUTH AND THROAT WITH OR WITHOUT PRESENCE OF ULCERS (sore throat, sores in mouth, or a toothache) UNUSUAL RASH, SWELLING OR PAIN  UNUSUAL VAGINAL DISCHARGE OR ITCHING   Items with * indicate a potential emergency and should be followed up as soon as possible or go to the Emergency Department if any problems should occur.  Please show the CHEMOTHERAPY ALERT CARD or IMMUNOTHERAPY ALERT CARD  at check-in to the Emergency Department and triage nurse.  Should you have questions after your visit or need to cancel or reschedule your appointment, please contact [facility]  Dept: [phone]  and follow the prompts.  Office hours are 8:00 a.m. to 4:30 p.m. Monday - Friday. Please note that voicemails left after 4:00 p.m. may not be returned until the following business day.  We are closed weekends and major holidays. You have access to a nurse at all times for urgent questions. Please call the main number to the clinic Dept: [phone] and follow the prompts.   For any non-urgent questions, you may also contact your provider using MyChart. We now offer e-Visits for anyone 52 and older to request care online for non-urgent symptoms. For details visit [email or web address].   Also download the MyChart app! Go to the app store, search "MyChart", open the app, select [facility], and log in with your MyChart username and password.  Due to Covid, a mask is required upon entering the hospital/clinic. If you do not have a mask, one will be given to you upon arrival. For doctor visits, patients may have 1 support person aged 41 or older with them. For treatment visits, patients cannot have anyone with them due to current Covid guidelines and our immunocompromised population.

## 2021-03-26 NOTE — Progress Notes (Signed)
Nutrition follow-up completed with patient's in the infusion room during treatment for multiple myeloma.  Weight was documented as 172.1 pounds February 14 which is decreased from 179 pounds January 31. Labs were reviewed. Patient reports he feels much better.  His appetite has improved greatly and he knows he is eating more than he was. Taste alterations have also improved.  Patient is working on a bowel regimen.  Patient reports he is beginning to exercise more.  Nutrition diagnosis: Inadequate oral intake appears improved.  Intervention: Reviewed sources of high-calorie, high-protein foods.  Encourage patient to continue smaller more frequent meals with snacks. Reviewed additional strategies for managing constipation.  Provided nutrition handout. Provided support and encouragement. Provided my contact information.  Monitoring, evaluation, goals: Patient will tolerate increased calories and protein to minimize weight loss.  Next visit: Tuesday, March 28 during infusion.  **Disclaimer: This note was dictated with voice recognition software. Similar sounding words can inadvertently be transcribed and this note may contain transcription errors which may not have been corrected upon publication of note.**

## 2021-03-28 ENCOUNTER — Telehealth: Payer: Self-pay | Admitting: Oncology

## 2021-03-28 DIAGNOSIS — R000 Tachycardia, unspecified: Secondary | ICD-10-CM | POA: Diagnosis not present

## 2021-03-28 DIAGNOSIS — D638 Anemia in other chronic diseases classified elsewhere: Secondary | ICD-10-CM | POA: Diagnosis not present

## 2021-03-28 DIAGNOSIS — R002 Palpitations: Secondary | ICD-10-CM | POA: Diagnosis not present

## 2021-03-28 NOTE — Telephone Encounter (Signed)
Sch per 2/14 los, pt aware

## 2021-03-29 ENCOUNTER — Encounter: Payer: Self-pay | Admitting: Oncology

## 2021-04-01 ENCOUNTER — Telehealth: Payer: Self-pay

## 2021-04-01 NOTE — Telephone Encounter (Signed)
NOTES SCANNED TO REFERRAL 

## 2021-04-01 NOTE — Telephone Encounter (Signed)
Pt calling to request prescription for Ultram. Called [facility] pharmacy and e-script from 03/12/21 visit with Dr. Alen Blew did not go through. In basket message sent to Dr. Alen Blew and pod 5.

## 2021-04-02 ENCOUNTER — Other Ambulatory Visit: Payer: Self-pay | Admitting: Oncology

## 2021-04-02 ENCOUNTER — Inpatient Hospital Stay: Payer: Medicare Other

## 2021-04-02 MED ORDER — TRAMADOL HCL 50 MG PO TABS: 50.0000 mg | ORAL_TABLET | Freq: Four times a day (QID) | ORAL | 1 refills | Status: DC | PRN

## 2021-04-03 ENCOUNTER — Encounter: Payer: Self-pay | Admitting: Oncology

## 2021-04-09 ENCOUNTER — Other Ambulatory Visit: Payer: Self-pay | Admitting: Oncology

## 2021-04-10 ENCOUNTER — Inpatient Hospital Stay: Payer: Medicare Other

## 2021-04-11 ENCOUNTER — Telehealth: Payer: Self-pay | Admitting: Oncology

## 2021-04-11 ENCOUNTER — Telehealth: Payer: Self-pay | Admitting: *Deleted

## 2021-04-11 NOTE — Telephone Encounter (Signed)
.  Called pt per 3/7 inbasket , Patient was unavailable, a message with appt time and date was left with number on file.   ?

## 2021-04-11 NOTE — Telephone Encounter (Signed)
VM left from patient - he states he wants to restart his treatments, he was told last week that scheduling would contact him to set up new appointments but no one has called.  Scheduling message sent for patient to be contacted ASAP regarding this matter. ?

## 2021-04-16 ENCOUNTER — Inpatient Hospital Stay: Payer: Medicare Other

## 2021-04-17 ENCOUNTER — Other Ambulatory Visit: Payer: Self-pay

## 2021-04-17 ENCOUNTER — Inpatient Hospital Stay: Payer: Medicare Other | Attending: Oncology

## 2021-04-17 ENCOUNTER — Inpatient Hospital Stay: Payer: Medicare Other

## 2021-04-17 VITALS — BP 139/83 | HR 84 | Temp 97.7°F | Wt 175.5 lb

## 2021-04-17 DIAGNOSIS — I429 Cardiomyopathy, unspecified: Secondary | ICD-10-CM | POA: Insufficient documentation

## 2021-04-17 DIAGNOSIS — C9000 Multiple myeloma not having achieved remission: Secondary | ICD-10-CM

## 2021-04-17 DIAGNOSIS — D649 Anemia, unspecified: Secondary | ICD-10-CM | POA: Diagnosis not present

## 2021-04-17 DIAGNOSIS — Z5112 Encounter for antineoplastic immunotherapy: Secondary | ICD-10-CM | POA: Insufficient documentation

## 2021-04-17 DIAGNOSIS — C9002 Multiple myeloma in relapse: Secondary | ICD-10-CM | POA: Insufficient documentation

## 2021-04-17 LAB — CMP (CANCER CENTER ONLY)
ALT: 11 U/L (ref 0–44)
AST: 14 U/L — ABNORMAL LOW (ref 15–41)
Albumin: 4.4 g/dL (ref 3.5–5.0)
Alkaline Phosphatase: 39 U/L (ref 38–126)
Anion gap: 6 (ref 5–15)
BUN: 16 mg/dL (ref 8–23)
CO2: 26 mmol/L (ref 22–32)
Calcium: 9.7 mg/dL (ref 8.9–10.3)
Chloride: 107 mmol/L (ref 98–111)
Creatinine: 0.81 mg/dL (ref 0.61–1.24)
GFR, Estimated: >60 mL/min (ref >60)
Glucose, Bld: 94 mg/dL (ref 70–99)
Potassium: 3.9 mmol/L (ref 3.5–5.1)
Sodium: 139 mmol/L (ref 135–145)
Total Bilirubin: 0.8 mg/dL (ref 0.3–1.2)
Total Protein: 6.8 g/dL (ref 6.5–8.1)

## 2021-04-17 LAB — CBC WITH DIFFERENTIAL (CANCER CENTER ONLY)
Abs Immature Granulocytes: 0.01 10*3/uL (ref 0.00–0.07)
Basophils Absolute: 0.1 10*3/uL (ref 0.0–0.1)
Basophils Relative: 1 %
Eosinophils Absolute: 0.5 10*3/uL (ref 0.0–0.5)
Eosinophils Relative: 9 %
HCT: 32.8 % — ABNORMAL LOW (ref 39.0–52.0)
Hemoglobin: 11.1 g/dL — ABNORMAL LOW (ref 13.0–17.0)
Immature Granulocytes: 0 %
Lymphocytes Relative: 12 %
Lymphs Abs: 0.6 10*3/uL — ABNORMAL LOW (ref 0.7–4.0)
MCH: 33.4 pg (ref 26.0–34.0)
MCHC: 33.8 g/dL (ref 30.0–36.0)
MCV: 98.8 fL (ref 80.0–100.0)
Monocytes Absolute: 0.7 10*3/uL (ref 0.1–1.0)
Monocytes Relative: 14 %
Neutro Abs: 3.4 10*3/uL (ref 1.7–7.7)
Neutrophils Relative %: 64 %
Platelet Count: 317 10*3/uL (ref 150–400)
RBC: 3.32 MIL/uL — ABNORMAL LOW (ref 4.22–5.81)
RDW: 17 % — ABNORMAL HIGH (ref 11.5–15.5)
WBC Count: 5.3 10*3/uL (ref 4.0–10.5)
nRBC: 0 % (ref 0.0–0.2)

## 2021-04-17 MED ORDER — DIPHENHYDRAMINE HCL 25 MG PO CAPS
50.0000 mg | ORAL_CAPSULE | Freq: Once | ORAL | Status: AC
  Administered 2021-04-17: 50 mg via ORAL
  Filled 2021-04-17: qty 2

## 2021-04-17 MED ORDER — SODIUM CHLORIDE 0.9 % IV SOLN: Freq: Once | INTRAVENOUS | Status: AC

## 2021-04-17 MED ORDER — DEXTROSE 5 % IV SOLN
70.0000 mg/m2 | Freq: Once | INTRAVENOUS | Status: AC
  Administered 2021-04-17: 140 mg via INTRAVENOUS
  Filled 2021-04-17: qty 60

## 2021-04-17 MED ORDER — ACETAMINOPHEN 325 MG PO TABS
650.0000 mg | ORAL_TABLET | Freq: Once | ORAL | Status: AC
  Administered 2021-04-17: 650 mg via ORAL
  Filled 2021-04-17: qty 2

## 2021-04-17 MED ORDER — DARATUMUMAB-HYALURONIDASE-FIHJ 1800-30000 MG-UT/15ML ~~LOC~~ SOLN
1800.0000 mg | Freq: Once | SUBCUTANEOUS | Status: AC
  Administered 2021-04-17: 1800 mg via SUBCUTANEOUS
  Filled 2021-04-17: qty 15

## 2021-04-17 MED ORDER — SODIUM CHLORIDE 0.9 % IV SOLN
20.0000 mg | Freq: Once | INTRAVENOUS | Status: AC
  Administered 2021-04-17: 20 mg via INTRAVENOUS
  Filled 2021-04-17: qty 20

## 2021-04-18 LAB — KAPPA/LAMBDA LIGHT CHAINS
Kappa free light chain: 7 mg/L (ref 3.3–19.4)
Kappa, lambda light chain ratio: 1.75 — ABNORMAL HIGH (ref 0.26–1.65)
Lambda free light chains: 4 mg/L — ABNORMAL LOW (ref 5.7–26.3)

## 2021-04-19 ENCOUNTER — Other Ambulatory Visit: Payer: Self-pay

## 2021-04-19 ENCOUNTER — Ambulatory Visit (INDEPENDENT_AMBULATORY_CARE_PROVIDER_SITE_OTHER): Payer: Medicare Other | Admitting: Cardiovascular Disease

## 2021-04-19 ENCOUNTER — Encounter: Payer: Self-pay | Admitting: Cardiovascular Disease

## 2021-04-19 VITALS — BP 120/68 | HR 83 | Ht 72.0 in | Wt 182.2 lb

## 2021-04-19 DIAGNOSIS — R002 Palpitations: Secondary | ICD-10-CM | POA: Insufficient documentation

## 2021-04-19 DIAGNOSIS — R9431 Abnormal electrocardiogram [ECG] [EKG]: Secondary | ICD-10-CM

## 2021-04-19 LAB — MULTIPLE MYELOMA PANEL, SERUM
Albumin SerPl Elph-Mcnc: 3.9 g/dL (ref 2.9–4.4)
Albumin/Glob SerPl: 1.7 (ref 0.7–1.7)
Alpha 1: 0.2 g/dL (ref 0.0–0.4)
Alpha2 Glob SerPl Elph-Mcnc: 0.7 g/dL (ref 0.4–1.0)
B-Globulin SerPl Elph-Mcnc: 0.9 g/dL (ref 0.7–1.3)
Gamma Glob SerPl Elph-Mcnc: 0.6 g/dL (ref 0.4–1.8)
Globulin, Total: 2.4 g/dL (ref 2.2–3.9)
IgA: 22 mg/dL — ABNORMAL LOW (ref 61–437)
IgG (Immunoglobin G), Serum: 667 mg/dL (ref 603–1613)
IgM (Immunoglobulin M), Srm: 6 mg/dL — ABNORMAL LOW (ref 15–143)
M Protein SerPl Elph-Mcnc: 0.2 g/dL — ABNORMAL HIGH
Total Protein ELP: 6.3 g/dL (ref 6.0–8.5)

## 2021-04-19 NOTE — Progress Notes (Signed)
? ? ? ?04/19/2021 ?[name]   ?1950-11-14  ?[id number] ? ?Primary Physician Benito Mccreedy, MD ?Primary Cardiologist: Lorretta Harp MD Lupe Carney, [place] ? ?HPI:  Alexander Saunders is a 71 y.o.  fit appearing married African-American male father of 2 with no grandchildren who was referred by [facility] because of an abnormal EKG. 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  He is accompanied by his wife Alexander Saunders.I last saw him in the office 04/23/2017.  Today.  He has no cardiac risk factors. He saw Dr. Rex Kras remotely because of an abnormal EKG and has had functional testing. Unfortunately has multiple myeloma and is scheduled to have a stem cell transplant. A 2-D echo performed 04/15/17 was normal except for mild concentric LVH. He exercises regularly and walks 2 miles a day with his wife. Does have peripheral neuropathy probably from chemotherapy. ? ?Since I saw him he did have a stem cell transplant but has relapsed within the last year and is getting chemotherapy.  He noticed resting tachycardia when his hemoglobin dropped in the 6 range along with palpitations which have resolved since his hemoglobin is back in the 11 range.  He does drink several cups of coffee a day as well.  He denies chest pain or shortness of breath. ? ? ?Current Meds  ?Medication Sig  ? aspirin EC 81 MG tablet Take 81 mg by mouth daily.  ? B Complex Vitamins (B-COMPLEX/B-12) TABS Take 1 tablet by mouth daily.  ? bisacodyl (DULCOLAX) 5 MG EC tablet Take 5 mg by mouth daily as needed for moderate constipation.  ? carboxymethylcellulose (REFRESH PLUS) 0.5 % SOLN Place 1 drop into both eyes 3 (three) times daily as needed (dry eyes).  ? cholecalciferol (VITAMIN D3) 25 MCG (1000 UNIT) tablet Take 1,000 Units by mouth daily.  ? cyclobenzaprine (FLEXERIL) 5 MG tablet Take 1 tablet (5 mg total) by mouth at bedtime as needed for muscle spasms.  ? diphenhydramine-acetaminophen  (TYLENOL PM EXTRA STRENGTH) 25-500 MG TABS tablet Take 1 tablet by mouth at bedtime as needed (pain).  ? gabapentin (NEURONTIN) 300 MG capsule TAKE 3 CAPSULE BY MOUTH THREE TIMES DAILY. 3 CAPSULE IN THE MORNING 3 CAPSULE IN THE AFTERNOON AND 3 CAPSULE AT NIGHT  ? losartan (COZAAR) 25 MG tablet Take 25 mg by mouth daily.  ? Magnesium 100 MG TABS Take 100 mg by mouth daily.  ? Multiple Vitamin (MULTIVITAMIN) capsule Take 1 capsule by mouth daily.  ? sodium chloride (OCEAN) 0.65 % SOLN nasal spray Place 1 spray into both nostrils daily as needed for congestion.  ? traMADol (ULTRAM) 50 MG tablet Take 1 tablet (50 mg total) by mouth every 6 (six) hours as needed.  ?  ? ?No Known Allergies ? ?Social History  ? ?Socioeconomic History  ? Marital status: Married  ?  Spouse name: Not on file  ? Number of children: Not on file  ? Years of education: Not on file  ? Highest education level: Not on file  ?Occupational History  ? Not on file  ?Tobacco Use  ? Smoking status: Never  ? Smokeless tobacco: Never  ?Vaping Use  ? Vaping Use: Never used  ?Substance and Sexual Activity  ? Alcohol use: Yes  ?  Comment: occ  ? Drug use: Never  ? Sexual activity: Not on file  ?Other Topics Concern  ? Not on file  ?Social History Narrative  ? He  worked as a Barrister's clerk  ? Highest level of education:  college  ? He is back in college at [employer] studying exercise physiology.   ?   ? Right handed  ?   ? Single story home  ? ?Social Determinants of Health  ? ?Financial Resource Strain: Not on file  ?Food Insecurity: Not on file  ?Transportation Needs: Not on file  ?Physical Activity: Not on file  ?Stress: Not on file  ?Social Connections: Not on file  ?Intimate Partner Violence: Not on file  ?  ? ?Review of Systems: ?General: negative for chills, fever, night sweats or weight changes.  ?Cardiovascular: negative for chest pain, dyspnea on exertion, edema, orthopnea, palpitations, paroxysmal nocturnal dyspnea or shortness of  breath ?Dermatological: negative for rash ?Respiratory: negative for cough or wheezing ?Urologic: negative for hematuria ?Abdominal: negative for nausea, vomiting, diarrhea, bright red blood per rectum, melena, or hematemesis ?Neurologic: negative for visual changes, syncope, or dizziness ?All other systems reviewed and are otherwise negative except as noted above. ? ? ? ?Blood pressure 120/68, pulse 83, height 6' (1.829 m), weight 182 lb 3.2 oz (82.6 kg), SpO2 98 %.  ?General appearance: alert and no distress ?Neck: no adenopathy, no carotid bruit, no JVD, supple, symmetrical, trachea midline, and thyroid not enlarged, symmetric, no tenderness/mass/nodules ?Lungs: clear to auscultation bilaterally ?Heart: regular rate and rhythm, S1, S2 normal, no murmur, click, rub or gallop ?Extremities: extremities normal, atraumatic, no cyanosis or edema ?Pulses: 2+ and symmetric ?Skin: Skin color, texture, turgor normal. No rashes or lesions ?Neurologic: Grossly normal ? ?EKG sinus rhythm at 83 with biphasic T waves.  I personally reviewed this EKG. ? ?ASSESSMENT AND PLAN:  ? ?Palpitations ?Mr. Pinard returns after last being seen 4 years ago.  He had stem cell transplant for mild multiple myeloma [name] has relapsed.  He noticed a resting tachycardia when his hemoglobin was in the 6 range which is improved now that is above 11.  The palpitations he was feeling at that time have resolved as well for the most part.  He is fairly active and exercises on a routine basis.  He denies chest pain or shortness of breath. ? ? ? ? ?Lorretta Harp MD FACP,FACC,FAHA, FSCAI ?04/19/2021 ?1:56 PM ?

## 2021-04-19 NOTE — Assessment & Plan Note (Signed)
Alexander Saunders returns after last being seen 4 years ago.  He had stem cell transplant for mild multiple myeloma [name] has relapsed.  He noticed a resting tachycardia when his hemoglobin was in the 6 range which is improved now that is above 11.  The palpitations he was feeling at that time have resolved as well for the most part.  He is fairly active and exercises on a routine basis.  He denies chest pain or shortness of breath. ?

## 2021-04-19 NOTE — Patient Instructions (Addendum)
Medication Instructions:  ?No changes ?*If you need a refill on your cardiac medications before your next appointment, please call your pharmacy* ? ? ?Lab Work: ?None ordered ?If you have labs (blood work) drawn today and your tests are completely normal, you will receive your results only by: ?MyChart Message (if you have MyChart) OR ?A paper copy in the mail ?If you have any lab test that is abnormal or we need to change your treatment, we will call you to review the results. ? ? ?Testing/Procedures: ?Your physician has requested that you have an echocardiogram. Echocardiography is a painless test that uses sound waves to create images of your heart. It provides your doctor with information about the size and shape of your heart and how well your heart?s chambers and valves are working. You may receive an ultrasound enhancing agent through an IV if needed to better visualize your heart during the echo.This procedure takes approximately one hour. There are no restrictions for this procedure. This will take place at the [address].  ? ? ?Follow-Up: ?At [facility], you and your health needs are our priority.  As part of our continuing mission to provide you with exceptional heart care, we have created designated Provider Care Teams.  These Care Teams include your primary Cardiologist (physician) and Advanced Practice Providers (APPs -  Physician Assistants and Nurse Practitioners) who all work together to provide you with the care you need, when you need it. ? ?We recommend signing up for the patient portal called "MyChart".  Sign up information is provided on this After Visit Summary.  MyChart is used to connect with patients for Virtual Visits (Telemedicine).  Patients are able to view lab/test results, encounter notes, upcoming appointments, etc.  Non-urgent messages can be sent to your provider as well.   ?To learn more about what you can do with MyChart, go to [email or web address].    ? ?Your next appointment:   ?12 month(s) ? ?The format for your next appointment:   ?In Person ? ?Provider:   ?Dr. [name] ? ? ?

## 2021-04-23 ENCOUNTER — Other Ambulatory Visit: Payer: TRICARE For Life (TFL)

## 2021-04-23 ENCOUNTER — Ambulatory Visit: Payer: TRICARE For Life (TFL)

## 2021-04-23 ENCOUNTER — Ambulatory Visit: Payer: TRICARE For Life (TFL) | Admitting: Oncology

## 2021-04-24 ENCOUNTER — Inpatient Hospital Stay: Payer: Medicare Other

## 2021-04-24 ENCOUNTER — Other Ambulatory Visit: Payer: Self-pay | Admitting: Oncology

## 2021-04-24 ENCOUNTER — Other Ambulatory Visit: Payer: Self-pay

## 2021-04-24 VITALS — BP 159/88 | HR 88 | Temp 97.7°F | Resp 17 | Wt 185.0 lb

## 2021-04-24 DIAGNOSIS — C9000 Multiple myeloma not having achieved remission: Secondary | ICD-10-CM

## 2021-04-24 DIAGNOSIS — I429 Cardiomyopathy, unspecified: Secondary | ICD-10-CM | POA: Diagnosis not present

## 2021-04-24 DIAGNOSIS — D649 Anemia, unspecified: Secondary | ICD-10-CM | POA: Diagnosis not present

## 2021-04-24 DIAGNOSIS — Z5112 Encounter for antineoplastic immunotherapy: Secondary | ICD-10-CM | POA: Diagnosis not present

## 2021-04-24 DIAGNOSIS — C9002 Multiple myeloma in relapse: Secondary | ICD-10-CM | POA: Diagnosis not present

## 2021-04-24 LAB — CBC WITH DIFFERENTIAL (CANCER CENTER ONLY)
Abs Immature Granulocytes: 0.03 10*3/uL (ref 0.00–0.07)
Basophils Absolute: 0 10*3/uL (ref 0.0–0.1)
Basophils Relative: 1 %
Eosinophils Absolute: 0.7 10*3/uL — ABNORMAL HIGH (ref 0.0–0.5)
Eosinophils Relative: 9 %
HCT: 32.1 % — ABNORMAL LOW (ref 39.0–52.0)
Hemoglobin: 10.7 g/dL — ABNORMAL LOW (ref 13.0–17.0)
Immature Granulocytes: 0 %
Lymphocytes Relative: 9 %
Lymphs Abs: 0.7 10*3/uL (ref 0.7–4.0)
MCH: 33 pg (ref 26.0–34.0)
MCHC: 33.3 g/dL (ref 30.0–36.0)
MCV: 99.1 fL (ref 80.0–100.0)
Monocytes Absolute: 1.1 10*3/uL — ABNORMAL HIGH (ref 0.1–1.0)
Monocytes Relative: 15 %
Neutro Abs: 4.8 10*3/uL (ref 1.7–7.7)
Neutrophils Relative %: 66 %
Platelet Count: 178 10*3/uL (ref 150–400)
RBC: 3.24 MIL/uL — ABNORMAL LOW (ref 4.22–5.81)
RDW: 16 % — ABNORMAL HIGH (ref 11.5–15.5)
WBC Count: 7.2 10*3/uL (ref 4.0–10.5)
nRBC: 0 % (ref 0.0–0.2)

## 2021-04-24 LAB — CMP (CANCER CENTER ONLY)
ALT: 10 U/L (ref 0–44)
AST: 12 U/L — ABNORMAL LOW (ref 15–41)
Albumin: 3.9 g/dL (ref 3.5–5.0)
Alkaline Phosphatase: 35 U/L — ABNORMAL LOW (ref 38–126)
Anion gap: 6 (ref 5–15)
BUN: 8 mg/dL (ref 8–23)
CO2: 25 mmol/L (ref 22–32)
Calcium: 9.2 mg/dL (ref 8.9–10.3)
Chloride: 108 mmol/L (ref 98–111)
Creatinine: 0.74 mg/dL (ref 0.61–1.24)
GFR, Estimated: >60 mL/min (ref >60)
Glucose, Bld: 92 mg/dL (ref 70–99)
Potassium: 4 mmol/L (ref 3.5–5.1)
Sodium: 139 mmol/L (ref 135–145)
Total Bilirubin: 0.7 mg/dL (ref 0.3–1.2)
Total Protein: 5.9 g/dL — ABNORMAL LOW (ref 6.5–8.1)

## 2021-04-24 MED ORDER — DARATUMUMAB-HYALURONIDASE-FIHJ 1800-30000 MG-UT/15ML ~~LOC~~ SOLN
1800.0000 mg | Freq: Once | SUBCUTANEOUS | Status: AC
  Administered 2021-04-24: 1800 mg via SUBCUTANEOUS
  Filled 2021-04-24: qty 15

## 2021-04-24 MED ORDER — ACETAMINOPHEN 325 MG PO TABS
650.0000 mg | ORAL_TABLET | Freq: Once | ORAL | Status: AC
  Administered 2021-04-24: 650 mg via ORAL
  Filled 2021-04-24: qty 2

## 2021-04-24 MED ORDER — DIPHENHYDRAMINE HCL 25 MG PO CAPS
50.0000 mg | ORAL_CAPSULE | Freq: Once | ORAL | Status: AC
  Administered 2021-04-24: 50 mg via ORAL
  Filled 2021-04-24: qty 2

## 2021-04-24 MED ORDER — DEXAMETHASONE 4 MG PO TABS
20.0000 mg | ORAL_TABLET | Freq: Once | ORAL | Status: AC
  Administered 2021-04-24: 20 mg via ORAL
  Filled 2021-04-24: qty 5

## 2021-04-24 NOTE — Patient Instructions (Addendum)
[facility]  Discharge Instructions: ?Thank you for choosing [facility] to provide your oncology and hematology care.  ? ?If you have a lab appointment with the [facility], please go directly to the [facility] and check in at the registration area. ?  ?Wear comfortable clothing and clothing appropriate for easy access to any Portacath or PICC line.  ? ?We strive to give you quality time with your provider. You may need to reschedule your appointment if you arrive late (15 or more minutes).  Arriving late affects you and other patients whose appointments are after yours.  Also, if you miss three or more appointments without notifying the office, you may be dismissed from the clinic at the provider?s discretion.    ?  ?For prescription refill requests, have your pharmacy contact our office and allow 72 hours for refills to be completed.   ? ?Today you received the following chemotherapy and/or immunotherapy agent:  Darazalex   Faspro ?  ?To help prevent nausea and vomiting after your treatment, we encourage you to take your nausea medication as directed. ? ?BELOW ARE SYMPTOMS THAT SHOULD BE REPORTED IMMEDIATELY: ?*FEVER GREATER THAN 100.4 F (38 ?C) OR HIGHER ?*CHILLS OR SWEATING ?*NAUSEA AND VOMITING THAT IS NOT CONTROLLED WITH YOUR NAUSEA MEDICATION ?*UNUSUAL SHORTNESS OF BREATH ?*UNUSUAL BRUISING OR BLEEDING ?*URINARY PROBLEMS (pain or burning when urinating, or frequent urination) ?*BOWEL PROBLEMS (unusual diarrhea, constipation, pain near the anus) ?TENDERNESS IN MOUTH AND THROAT WITH OR WITHOUT PRESENCE OF ULCERS (sore throat, sores in mouth, or a toothache) ?UNUSUAL RASH, SWELLING OR PAIN  ?UNUSUAL VAGINAL DISCHARGE OR ITCHING  ? ?Items with * indicate a potential emergency and should be followed up as soon as possible or go to the Emergency Department if any problems should occur. ? ?Please show the CHEMOTHERAPY ALERT CARD or IMMUNOTHERAPY ALERT CARD at  check-in to the Emergency Department and triage nurse. ? ?Should you have questions after your visit or need to cancel or reschedule your appointment, please contact [facility]  Dept: [phone]  and follow the prompts.  Office hours are 8:00 a.m. to 4:30 p.m. Monday - Friday. Please note that voicemails left after 4:00 p.m. may not be returned until the following business day.  We are closed weekends and major holidays. You have access to a nurse at all times for urgent questions. Please call the main number to the clinic Dept: [phone] and follow the prompts. ? ? ?For any non-urgent questions, you may also contact your provider using MyChart. We now offer e-Visits for anyone 62 and older to request care online for non-urgent symptoms. For details visit [email or web address]. ?  ?Also download the MyChart app! Go to the app store, search "MyChart", open the app, select [facility], and log in with your MyChart username and password. ? ?Due to Covid, a mask is required upon entering the hospital/clinic. If you do not have a mask, one will be given to you upon arrival. For doctor visits, patients may have 1 support person aged 69 or older with them. For treatment visits, patients cannot have anyone with them due to current Covid guidelines and our immunocompromised population.  ? ?

## 2021-04-29 ENCOUNTER — Encounter: Payer: Self-pay | Admitting: Internal Medicine

## 2021-04-29 ENCOUNTER — Other Ambulatory Visit: Payer: Self-pay

## 2021-04-29 ENCOUNTER — Ambulatory Visit (HOSPITAL_COMMUNITY): Payer: Medicare Other | Attending: Cardiology

## 2021-04-29 ENCOUNTER — Other Ambulatory Visit: Payer: Self-pay | Admitting: Cardiology

## 2021-04-29 ENCOUNTER — Ambulatory Visit (INDEPENDENT_AMBULATORY_CARE_PROVIDER_SITE_OTHER): Payer: Medicare Other | Admitting: Internal Medicine

## 2021-04-29 DIAGNOSIS — I340 Nonrheumatic mitral (valve) insufficiency: Secondary | ICD-10-CM | POA: Diagnosis not present

## 2021-04-29 DIAGNOSIS — R002 Palpitations: Secondary | ICD-10-CM | POA: Insufficient documentation

## 2021-04-29 DIAGNOSIS — C9000 Multiple myeloma not having achieved remission: Secondary | ICD-10-CM | POA: Diagnosis not present

## 2021-04-29 DIAGNOSIS — I5020 Unspecified systolic (congestive) heart failure: Secondary | ICD-10-CM | POA: Diagnosis not present

## 2021-04-29 DIAGNOSIS — R9431 Abnormal electrocardiogram [ECG] [EKG]: Secondary | ICD-10-CM | POA: Diagnosis not present

## 2021-04-29 LAB — ECHOCARDIOGRAM COMPLETE
Area-P 1/2: 6.54 cm2
MV M vel: 4.36 m/s
MV Peak grad: 76 mmHg
Radius: 0.7 cm
S' Lateral: 5.8 cm

## 2021-04-29 MED ORDER — CARVEDILOL 6.25 MG PO TABS: 6.2500 mg | ORAL_TABLET | Freq: Two times a day (BID) | ORAL | 3 refills | Status: DC

## 2021-04-29 NOTE — Patient Instructions (Signed)
Medication Instructions:  ?Your physician has recommended you make the following change in your medication:  ? ?START: carvedilol (Coreg) 6.25 mg by mouth twice daily ? ?*If you need a refill on your cardiac medications before your next appointment, please call your pharmacy* ? ? ?Lab Work: ?NONE ?If you have labs (blood work) drawn today and your tests are completely normal, you will receive your results only by: ?MyChart Message (if you have MyChart) OR ?A paper copy in the mail ?If you have any lab test that is abnormal or we need to change your treatment, we will call you to review the results. ? ? ?Testing/Procedures: ?Your physician has requested that you have an echocardiogram in 2 Weeks. Echocardiography is a painless test that uses sound waves to create images of your heart. It provides your doctor with information about the size and shape of your heart and how well your heart?s chambers and valves are working. This procedure takes approximately one hour. There are no restrictions for this procedure.  ? ? ?Follow-Up: ?At [facility], you and your health needs are our priority.  As part of our continuing mission to provide you with exceptional heart care, we have created designated Provider Care Teams.  These Care Teams include your primary Cardiologist (physician) and Advanced Practice Providers (APPs -  Physician Assistants and Nurse Practitioners) who all work together to provide you with the care you need, when you need it. ? ? ?Provider:   ?Quay Burow, MD   ? ?

## 2021-04-29 NOTE — Progress Notes (Unsigned)
Consulted Dr. Gasper Sells. ?

## 2021-04-29 NOTE — Progress Notes (Signed)
?Cardiology Office Note:   ? ?Date:  04/29/2021  ? ?ID:  Alexander Saunders, DOB 02-01-1951, MRN [id number] ? ?PCP:  Benito Mccreedy, MD ?  ?[facility] Providers ?Cardiologist:  Quay Burow, MD    ? ?Referring MD: Benito Mccreedy, MD  ? ?DOD: New EF 25% ? ?History of Present Illness:   ? ?Alexander Saunders is a 71 y.o. male with a hx of MM formerly with neoadjuvant radiation (25 Gy T spine, rest outside of cardiac field), prior stem cell transplant, Former Revlimid now on Carfilzomib and Daratumumab.  ?Had echo today with LVIDD 6.7, EF 25% and at least moderate MR.Small effusion with mild RA collapse. ? ?Patient notes that he is doing fine.  Originally saw Dr. [name] for palpitations.  Since January and needing escalating MM therapy he has been more lax about going to the gym.   ?There are no interval hospital/ED visit.   ? ?No chest pain or pressure .  No SOB/DOE and no PND/Orthopnea.  No weight gain or leg swelling.  No palpitations or syncope. ? ?He is anxious because he knows seeing a cardiologist when you aren't planned to is not good news. ? ? ?Past Medical History:  ?Diagnosis Date  ? Anxiety   ? occasional   ? Blood transfusion without reported diagnosis 2018  ? with spinal surgery  ? History of chemotherapy   ? completed 06-17-2017  ? History of radiation therapy   ? completed 11-2016 per pt  ? Multiple myeloma ([facility]) 2018  ? currently in remission   ? Neuromuscular disorder ([place])   ? neuropathy lower extremeties, primarily right leg   ? 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  ? Stem cells transplant status ([facility]) 06/2017  ? ? ?Past Surgical History:  ?Procedure Laterality Date  ? APPENDECTOMY    ? BONE MARROW BIOPSY  07/2018  ? COLONOSCOPY    ? KNEE ARTHROSCOPY    ? r/knee  ? LAMINECTOMY N/A 09/04/2016  ? Procedure: THORACIC FOUR-SIX LAMINECTOMY FOR RESECTION OF TUMOR;  Surgeon: Ditty, Kevan Ny, MD;  Location: [facility];  Service: Neurosurgery;  Laterality: N/A;  ? ? ?Current Medications: ?Current Meds   ?Medication Sig  ? carvedilol (COREG) 6.25 MG tablet Take 1 tablet (6.25 mg total) by mouth 2 (two) times daily.  ?  ? ?Allergies:   Patient has no known allergies.  ? ?Social History  ? ?Socioeconomic History  ? Marital status: Married  ?  Spouse name: Not on file  ? Number of children: Not on file  ? Years of education: Not on file  ? Highest education level: Not on file  ?Occupational History  ? Not on file  ?Tobacco Use  ? Smoking status: Never  ? Smokeless tobacco: Never  ?Vaping Use  ? Vaping Use: Never used  ?Substance and Sexual Activity  ? Alcohol use: Yes  ?  Comment: occ  ? Drug use: Never  ? Sexual activity: Not on file  ?Other Topics Concern  ? Not on file  ?Social History Narrative  ? He worked as a Barrister's clerk  ? Highest level of education:  college  ? He is back in college at [employer] studying exercise physiology.   ?   ? Right handed  ?   ? Single story home  ? ?Social Determinants of Health  ? ?Financial Resource Strain: Not on file  ?Food Insecurity: Not on file  ?Transportation Needs: Not on file  ?Physical Activity: Not on file  ?Stress: Not on file  ?  Social Connections: Not on file  ?  ? ?Family History: ?The patient's family history includes Cancer in his mother; Hypertension in his father and mother; Lung cancer in his mother. There is no history of Colon cancer, Colon polyps, Esophageal cancer, Rectal cancer, or Stomach cancer. ? ?ROS:   ?Please see the history of present illness.    ? All other systems reviewed and are negative. ? ?EKGs/Labs/Other Studies Reviewed:   ? ?Recent Labs: ?04/24/2021: ALT 10; BUN 8; Creatinine 0.74; Hemoglobin 10.7; Platelet Count 178; Potassium 4.0; Sodium 139  ?Recent Lipid Panel ?No results found for: CHOL, TRIG, HDL, CHOLHDL, VLDL, LDLCALC, LDLDIRECT ? ?    ? ?Physical Exam:   ? ?VS:   ? ?HR 105 ?BP 158/98 ?RR 15 ? ? ?Wt Readings from Last 3 Encounters:  ?04/24/21 185 lb (83.9 kg)  ?04/19/21 182 lb 3.2 oz (82.6 kg)  ?04/17/21 175 lb 8 oz (79.6 kg)  ?   ? ?GEN:  Well nourished, well developed in no acute distress ?HEENT: Normal ?NECK: No JVD; No carotid bruits ?LYMPHATICS: No lymphadenopathy ?CARDIAC: RRR, holosystolic murmur no rubs, gallops ?RESPIRATORY:  Clear to auscultation without rales, wheezing or rhonchi  ?ABDOMEN: Soft, non-tender, non-distended ?MUSCULOSKELETAL:  No edema; No deformity  ?SKIN: Warm and dry ?NEUROLOGIC:  Alert and oriented x 3 ?PSYCHIATRIC:  Normal affect  ? ?ASSESSMENT:   ? ?1. HFrEF (heart failure with reduced ejection fraction) ([facility])   ?2. Nonrheumatic mitral valve regurgitation   ?3. Multiple myeloma, remission status unspecified ([facility])   ? ?PLAN:   ? ?Multiple Myeloma ?New HFrEF in the setting of Carvlizomib with LV dilation suggestive of at least sub acute nature ?Secondary Mitral regurgitation ?Small Pericardial effusion with RA collapse and normal IVC and no respirophasic variation  ?- He has a new LVEF of 25% but has no symptoms ?- this could be related to his chemotherapy regimen ?- Clinically not suggestive of cardiac tamponade ?- will send message to patients primary cardiologist and oncologist; I cannot exclude chemotherapy induced cardiomyopathy; if no change in his regimen will need aggressive uptitration for GDMT ?- will start Coreg 6.25 mg PO BID ?- pericardial effusion is not necessarily related to Daratumumab; given the RA collapse will repeat limited echo. ?- has onc visit this week ? ?Needs f/u in one months with Dr. [name] or APP ? ?Time Spent Directly with Patient: ?  ?I have spent a total of 40 minutes with the patient reviewing notes, imaging, EKGs, labs and examining the patient as well as establishing an assessment and plan that was discussed personally with the patient.  > 50% of time was spent in direct patient care and reviewing imaging with patient . ? ? ? ? ?Medication Adjustments/Labs and Tests Ordered: ?Current medicines are reviewed at length with the patient today.  Concerns regarding medicines are  outlined above.  ?Orders Placed This Encounter  ?Procedures  ? ECHOCARDIOGRAM LIMITED  ? ?Meds ordered this encounter  ?Medications  ? carvedilol (COREG) 6.25 MG tablet  ?  Sig: Take 1 tablet (6.25 mg total) by mouth 2 (two) times daily.  ?  Dispense:  180 tablet  ?  Refill:  3  ? ? ?Patient Instructions  ?Medication Instructions:  ?Your physician has recommended you make the following change in your medication:  ? ?START: carvedilol (Coreg) 6.25 mg by mouth twice daily ? ?*If you need a refill on your cardiac medications before your next appointment, please call your pharmacy* ? ? ?Lab Work: ?NONE ?If  you have labs (blood work) drawn today and your tests are completely normal, you will receive your results only by: ?MyChart Message (if you have MyChart) OR ?A paper copy in the mail ?If you have any lab test that is abnormal or we need to change your treatment, we will call you to review the results. ? ? ?Testing/Procedures: ?Your physician has requested that you have an echocardiogram in 2 Weeks. Echocardiography is a painless test that uses sound waves to create images of your heart. It provides your doctor with information about the size and shape of your heart and how well your heart?s chambers and valves are working. This procedure takes approximately one hour. There are no restrictions for this procedure.  ? ? ?Follow-Up: ?At [facility], you and your health needs are our priority.  As part of our continuing mission to provide you with exceptional heart care, we have created designated Provider Care Teams.  These Care Teams include your primary Cardiologist (physician) and Advanced Practice Providers (APPs -  Physician Assistants and Nurse Practitioners) who all work together to provide you with the care you need, when you need it. ? ? ?Provider:   ?Quay Burow, MD   ?  ? ?Signed, ?Werner Lean, MD  ?04/29/2021 10:35 AM    ?[facility] ? ?

## 2021-04-30 ENCOUNTER — Ambulatory Visit: Payer: TRICARE For Life (TFL)

## 2021-04-30 ENCOUNTER — Encounter: Payer: TRICARE For Life (TFL) | Admitting: Nutrition

## 2021-04-30 ENCOUNTER — Other Ambulatory Visit: Payer: TRICARE For Life (TFL)

## 2021-05-01 ENCOUNTER — Inpatient Hospital Stay: Payer: Medicare Other

## 2021-05-01 ENCOUNTER — Inpatient Hospital Stay (HOSPITAL_BASED_OUTPATIENT_CLINIC_OR_DEPARTMENT_OTHER): Payer: Medicare Other | Admitting: Oncology

## 2021-05-01 ENCOUNTER — Other Ambulatory Visit: Payer: Self-pay

## 2021-05-01 VITALS — BP 128/87 | HR 65 | Temp 98.1°F | Resp 15 | Ht 72.0 in | Wt 183.1 lb

## 2021-05-01 DIAGNOSIS — I429 Cardiomyopathy, unspecified: Secondary | ICD-10-CM | POA: Diagnosis not present

## 2021-05-01 DIAGNOSIS — C9000 Multiple myeloma not having achieved remission: Secondary | ICD-10-CM

## 2021-05-01 DIAGNOSIS — Z5112 Encounter for antineoplastic immunotherapy: Secondary | ICD-10-CM | POA: Diagnosis not present

## 2021-05-01 DIAGNOSIS — C9002 Multiple myeloma in relapse: Secondary | ICD-10-CM | POA: Diagnosis not present

## 2021-05-01 DIAGNOSIS — D649 Anemia, unspecified: Secondary | ICD-10-CM | POA: Diagnosis not present

## 2021-05-01 LAB — CMP (CANCER CENTER ONLY)
ALT: 12 U/L (ref 0–44)
AST: 13 U/L — ABNORMAL LOW (ref 15–41)
Albumin: 4 g/dL (ref 3.5–5.0)
Alkaline Phosphatase: 36 U/L — ABNORMAL LOW (ref 38–126)
Anion gap: 8 (ref 5–15)
BUN: 19 mg/dL (ref 8–23)
CO2: 25 mmol/L (ref 22–32)
Calcium: 9.8 mg/dL (ref 8.9–10.3)
Chloride: 106 mmol/L (ref 98–111)
Creatinine: 0.82 mg/dL (ref 0.61–1.24)
GFR, Estimated: >60 mL/min (ref >60)
Glucose, Bld: 94 mg/dL (ref 70–99)
Potassium: 4.2 mmol/L (ref 3.5–5.1)
Sodium: 139 mmol/L (ref 135–145)
Total Bilirubin: 0.6 mg/dL (ref 0.3–1.2)
Total Protein: 6.3 g/dL — ABNORMAL LOW (ref 6.5–8.1)

## 2021-05-01 LAB — CBC WITH DIFFERENTIAL (CANCER CENTER ONLY)
Abs Immature Granulocytes: 0.04 10*3/uL (ref 0.00–0.07)
Basophils Absolute: 0.1 10*3/uL (ref 0.0–0.1)
Basophils Relative: 1 %
Eosinophils Absolute: 0.8 10*3/uL — ABNORMAL HIGH (ref 0.0–0.5)
Eosinophils Relative: 9 %
HCT: 33.8 % — ABNORMAL LOW (ref 39.0–52.0)
Hemoglobin: 11.7 g/dL — ABNORMAL LOW (ref 13.0–17.0)
Immature Granulocytes: 1 %
Lymphocytes Relative: 8 %
Lymphs Abs: 0.7 10*3/uL (ref 0.7–4.0)
MCH: 34.7 pg — ABNORMAL HIGH (ref 26.0–34.0)
MCHC: 34.6 g/dL (ref 30.0–36.0)
MCV: 100.3 fL — ABNORMAL HIGH (ref 80.0–100.0)
Monocytes Absolute: 0.8 10*3/uL (ref 0.1–1.0)
Monocytes Relative: 9 %
Neutro Abs: 5.9 10*3/uL (ref 1.7–7.7)
Neutrophils Relative %: 72 %
Platelet Count: 305 10*3/uL (ref 150–400)
RBC: 3.37 MIL/uL — ABNORMAL LOW (ref 4.22–5.81)
RDW: 16 % — ABNORMAL HIGH (ref 11.5–15.5)
WBC Count: 8.2 10*3/uL (ref 4.0–10.5)
nRBC: 0 % (ref 0.0–0.2)

## 2021-05-01 MED ORDER — DARATUMUMAB-HYALURONIDASE-FIHJ 1800-30000 MG-UT/15ML ~~LOC~~ SOLN
1800.0000 mg | Freq: Once | SUBCUTANEOUS | Status: AC
  Administered 2021-05-01: 1800 mg via SUBCUTANEOUS
  Filled 2021-05-01: qty 15

## 2021-05-01 MED ORDER — ACETAMINOPHEN 325 MG PO TABS
650.0000 mg | ORAL_TABLET | Freq: Once | ORAL | Status: AC
  Administered 2021-05-01: 650 mg via ORAL
  Filled 2021-05-01: qty 2

## 2021-05-01 MED ORDER — DEXAMETHASONE 4 MG PO TABS
20.0000 mg | ORAL_TABLET | Freq: Once | ORAL | Status: AC
  Administered 2021-05-01: 20 mg via ORAL
  Filled 2021-05-01: qty 5

## 2021-05-01 MED ORDER — SODIUM CHLORIDE 0.9 % IV SOLN
20.0000 mg | Freq: Once | INTRAVENOUS | Status: DC
  Filled 2021-05-01: qty 2

## 2021-05-01 MED ORDER — DIPHENHYDRAMINE HCL 25 MG PO CAPS
50.0000 mg | ORAL_CAPSULE | Freq: Once | ORAL | Status: AC
  Administered 2021-05-01: 50 mg via ORAL
  Filled 2021-05-01: qty 2

## 2021-05-01 NOTE — Progress Notes (Signed)
Hematology and Oncology Follow Up Visit ? ?Alexander Saunders ?[id number] ?1950-11-17 71 y.o. ?05/01/2021 8:30 AM ?Alexander Saunders, MDOsei-Bonsu, Alexander Beard, MD  ? ?Principle Diagnosis: 71 year old man with kappa free light chain multiple myeloma diagnosed in 2018 after presenting with epidural mass.  He developed relapsed disease in December 2022 with worsening anemia and increased bone marrow involvement. ? ?Prior Therapy: ? ?He is status post surgical decompression of an epidural mass on 09/04/2016 and the pathology showed a plasma cell neoplasm. ? ?He is S/P adjuvant radiation therapy to the thoracic spine to be completed on 10/14/2016. ? ?Velcade 1.5 mg/m? weekly with dexamethasone 20 mg and Cytoxan 600 mg po. Therapy started on 10/24/2016.  ? ?High-dose chemotherapy followed by stem cell transplant: Melphalan 200 mg/m2 completed on 06/17/17 followed by autologous stem cell rescue 06/18/17.  He achieved complete response. ? ? ?Zometa 4 mg every 3 months started in February 2020.  Last treatment given in January 2022. ? ? ?Revlimid 10 mg daily for 21 days out of a 28-day started in February 2020.  Therapy discontinued in November 2022 because of worsening anemia. ? ?Current therapy:  ? ?Carfilzomib 20 mg/m? weekly started on February 19, 2021 ?Daratumumab 1800 mg subcutaneous injection weekly started on February 19, 2021 ?Dexamethasone weekly started on February 19, 2021. ? ? ? ? ?Interim History:  Alexander Saunders returns today for a follow-up visit.  Since the last visit, he was evaluated by cardiology for palpitation and echocardiogram showed cardiomyopathy with EF around 25% although he is asymptomatic.  He continues to be active and attending activities of daily living.  He has been hiking 3 to 5 miles at a time periodically.  He denies any worsening neuropathy or dyspnea on exertion.  He denies any palpitation at this time. ? ? ? ? ?Medications: Updated on review. ?Current Outpatient Medications  ?Medication Sig Dispense  Refill  ? acyclovir (ZOVIRAX) 400 MG tablet Take 1 tablet (400 mg total) by mouth daily. (Patient not taking: Reported on 04/19/2021) 90 tablet 3  ? aspirin EC 81 MG tablet Take 81 mg by mouth daily.    ? B Complex Vitamins (B-COMPLEX/B-12) TABS Take 1 tablet by mouth daily.    ? bisacodyl (DULCOLAX) 5 MG EC tablet Take 5 mg by mouth daily as needed for moderate constipation.    ? carboxymethylcellulose (REFRESH PLUS) 0.5 % SOLN Place 1 drop into both eyes 3 (three) times daily as needed (dry eyes).    ? carvedilol (COREG) 6.25 MG tablet Take 1 tablet (6.25 mg total) by mouth 2 (two) times daily. 180 tablet 3  ? cholecalciferol (VITAMIN D3) 25 MCG (1000 UNIT) tablet Take 1,000 Units by mouth daily.    ? cyclobenzaprine (FLEXERIL) 5 MG tablet Take 1 tablet (5 mg total) by mouth at bedtime as needed for muscle spasms. 30 tablet 1  ? diphenhydramine-acetaminophen (TYLENOL PM EXTRA STRENGTH) 25-500 MG TABS tablet Take 1 tablet by mouth at bedtime as needed (pain).    ? gabapentin (NEURONTIN) 300 MG capsule TAKE 3 CAPSULE BY MOUTH THREE TIMES DAILY. 3 CAPSULE IN THE MORNING 3 CAPSULE IN THE AFTERNOON AND 3 CAPSULE AT NIGHT 270 capsule 3  ? losartan (COZAAR) 25 MG tablet Take 25 mg by mouth daily.    ? Magnesium 100 MG TABS Take 100 mg by mouth daily.    ? Multiple Vitamin (MULTIVITAMIN) capsule Take 1 capsule by mouth daily.    ? sodium chloride (OCEAN) 0.65 % SOLN nasal spray Place 1 spray into both  nostrils daily as needed for congestion.    ? traMADol (ULTRAM) 50 MG tablet Take 1 tablet (50 mg total) by mouth every 6 (six) hours as needed. 30 tablet 1  ? ?No current facility-administered medications for this visit.  ? ? ? ?Allergies: No Known Allergies ? ? ? ?Physical Exam: ? ? ? ? ? ?Blood pressure 128/87, pulse 65, temperature 98.1 ?F (36.7 ?C), temperature source Temporal, resp. rate 15, height 6' (1.829 m), weight 183 lb 1.6 oz (83.1 kg), SpO2 100 %. ? ? ? ? ? ? ? ? ? ?ECOG: 1 ? ? ? ?General appearance: Comfortable  appearing without any discomfort ?Head: Normocephalic without any trauma ?Oropharynx: Mucous membranes are moist and pink without any thrush or ulcers. ?Eyes: Pupils are equal and round reactive to light. ?Lymph nodes: No cervical, supraclavicular, inguinal or axillary lymphadenopathy.   ?Heart:regular rate and rhythm.  S1 and S2 without leg edema. ?Lung: Clear without any rhonchi or wheezes.  No dullness to percussion. ?Abdomin: Soft, nontender, nondistended with good bowel sounds.  No hepatosplenomegaly. ?Musculoskeletal: No joint deformity or effusion.  Full range of motion noted. ?Neurological: No deficits noted on motor, sensory and deep tendon reflex exam. ?Skin: No petechial rash or dryness.  Appeared moist.  ? ? ? ? ? ? ? ? ? ? ? ?Lab Results: ?Lab Results  ?Component Value Date  ? WBC 7.2 04/24/2021  ? HGB 10.7 (L) 04/24/2021  ? HCT 32.1 (L) 04/24/2021  ? MCV 99.1 04/24/2021  ? PLT 178 04/24/2021  ? ?  Chemistry   ?   ?Component Value Date/Time  ? NA 139 04/24/2021 1129  ? NA 137 02/06/2017 0828  ? K 4.0 04/24/2021 1129  ? K 4.0 02/06/2017 0828  ? CL 108 04/24/2021 1129  ? CO2 25 04/24/2021 1129  ? CO2 24 02/06/2017 0828  ? BUN 8 04/24/2021 1129  ? BUN 14.9 02/06/2017 0828  ? CREATININE 0.74 04/24/2021 1129  ? CREATININE 0.9 02/06/2017 0828  ?    ?Component Value Date/Time  ? CALCIUM 9.2 04/24/2021 1129  ? CALCIUM 9.1 02/06/2017 0828  ? ALKPHOS 35 (L) 04/24/2021 1129  ? ALKPHOS 31 (L) 02/06/2017 5997  ? AST 12 (L) 04/24/2021 1129  ? AST 32 02/06/2017 0828  ? ALT 10 04/24/2021 1129  ? ALT 43 02/06/2017 0828  ? BILITOT 0.7 04/24/2021 1129  ? BILITOT 0.42 02/06/2017 0828  ?  ? ? ? Latest Reference Range & Units 02/16/20 08:59 06/20/20 08:02 11/30/20 15:07 01/07/21 11:17 03/12/21 13:24  ?Kappa free light chain 3.3 - 19.4 mg/L 39.5 (H) 45.5 (H) 99.1 (H) 127.6 (H) 4.9  ?Lambda free light chains 5.7 - 26.3 mg/L 29.5 (H) 20.3 21.4 13.9 3.2 (L)  ?Kappa, lambda light chain ratio 0.26 - 1.65  1.34 2.24 (H) 4.63 (H)  9.18 (H) 1.53  ?(H): Data is abnormally high ?(L): Data is abnormally low ? ? ? ?Impression and Plan: ? ?71 year old man with: ?  ?1.  Kappa light chain relapsed multiple myeloma documented in December 2022.  Initially presented in 2018 with epidural mass. ? ?The natural course of his disease was reviewed and treatment choices were reiterated.  His last protein studies showed normal free kappa light chain with slightly increased ratio but otherwise his disease is under excellent control.  Risks and benefits of continuing this current regimen were discussed.  The concerns about cardiomyopathy related to carfilzomib is real especially he is potentially subacute presentation.  The literature cites about 2% chance  of cardiomyopathy associated with this drug. ? ?After discussion today, we opted to continue with Darzalex with dexamethasone alone and discontinue O's. ? ? ?2.  Bone health: Zometa can be restarted in the future he develops bone disease. ? ? ?3.  Neuropathy: No recent exacerbation noted at this time.  He continues to be on gabapentin. ? ?4.  VZV prophylaxis: He continues to be on acyclovir without any reactivation.  I recommended continuing this for the time being. ? ?5.  Anemia: Related to plasma cell disorder and has resolved at this time.  Hemoglobin is adequate today and continues to improve without any need for transfusion. ? ?6.  IV access: Peripheral veins continue to be in use at this time. ? ?7.  Antiemetics: No nausea or vomiting reported at this time. ? ?8.  Cardiomyopathy: Continues to follow with cardiology.  He is asymptomatic at this time. ? ?9. Follow-up: Every 2 weeks per Darzalex protocol. ? ?30 minutes were spent on this visit.  The time was dedicated to reviewing laboratory data, disease status update, treatment choices and future plan of care review. ? ? ?Zola Button, MD ?3/29/20238:30 AM ?

## 2021-05-01 NOTE — Patient Instructions (Signed)
[facility]  Discharge Instructions: Thank you for choosing [facility] to provide your oncology and hematology care.   If you have a lab appointment with the Cancer Center, please go directly to the Cancer Center and check in at the registration area.   Wear comfortable clothing and clothing appropriate for easy access to any Portacath or PICC line.   We strive to give you quality time with your provider. You may need to reschedule your appointment if you arrive late (15 or more minutes).  Arriving late affects you and other patients whose appointments are after yours.  Also, if you miss three or more appointments without notifying the office, you may be dismissed from the clinic at the provider's discretion.      For prescription refill requests, have your pharmacy contact our office and allow 72 hours for refills to be completed.    Today you received the following chemotherapy and/or immunotherapy agents: Darzalex Faspro      To help prevent nausea and vomiting after your treatment, we encourage you to take your nausea medication as directed.  BELOW ARE SYMPTOMS THAT SHOULD BE REPORTED IMMEDIATELY: *FEVER GREATER THAN 100.4 F (38 C) OR HIGHER *CHILLS OR SWEATING *NAUSEA AND VOMITING THAT IS NOT CONTROLLED WITH YOUR NAUSEA MEDICATION *UNUSUAL SHORTNESS OF BREATH *UNUSUAL BRUISING OR BLEEDING *URINARY PROBLEMS (pain or burning when urinating, or frequent urination) *BOWEL PROBLEMS (unusual diarrhea, constipation, pain near the anus) TENDERNESS IN MOUTH AND THROAT WITH OR WITHOUT PRESENCE OF ULCERS (sore throat, sores in mouth, or a toothache) UNUSUAL RASH, SWELLING OR PAIN  UNUSUAL VAGINAL DISCHARGE OR ITCHING   Items with * indicate a potential emergency and should be followed up as soon as possible or go to the Emergency Department if any problems should occur.  Please show the CHEMOTHERAPY ALERT CARD or IMMUNOTHERAPY ALERT CARD at  check-in to the Emergency Department and triage nurse.  Should you have questions after your visit or need to cancel or reschedule your appointment, please contact [facility]  Dept: [phone]  and follow the prompts.  Office hours are 8:00 a.m. to 4:30 p.m. Monday - Friday. Please note that voicemails left after 4:00 p.m. may not be returned until the following business day.  We are closed weekends and major holidays. You have access to a nurse at all times for urgent questions. Please call the main number to the clinic Dept: [phone] and follow the prompts.   For any non-urgent questions, you may also contact your provider using MyChart. We now offer e-Visits for anyone 18 and older to request care online for non-urgent symptoms. For details visit [email or web address].   Also download the MyChart app! Go to the app store, search "MyChart", open the app, select [place], and log in with your MyChart username and password.  Due to Covid, a mask is required upon entering the hospital/clinic. If you do not have a mask, one will be given to you upon arrival. For doctor visits, patients may have 1 support person aged 18 or older with them. For treatment visits, patients cannot have anyone with them due to current Covid guidelines and our immunocompromised population.  

## 2021-05-07 ENCOUNTER — Other Ambulatory Visit: Payer: TRICARE For Life (TFL)

## 2021-05-07 ENCOUNTER — Ambulatory Visit: Payer: TRICARE For Life (TFL)

## 2021-05-13 ENCOUNTER — Ambulatory Visit (HOSPITAL_COMMUNITY): Payer: Medicare Other | Attending: Internal Medicine

## 2021-05-13 DIAGNOSIS — I5020 Unspecified systolic (congestive) heart failure: Secondary | ICD-10-CM | POA: Diagnosis not present

## 2021-05-13 LAB — ECHOCARDIOGRAM LIMITED
Area-P 1/2: 3.37 cm2
S' Lateral: 5.6 cm

## 2021-05-15 ENCOUNTER — Other Ambulatory Visit: Payer: Self-pay

## 2021-05-15 ENCOUNTER — Inpatient Hospital Stay: Payer: Medicare Other | Attending: Oncology

## 2021-05-15 ENCOUNTER — Inpatient Hospital Stay: Payer: Medicare Other

## 2021-05-15 VITALS — BP 134/88 | HR 72 | Temp 97.8°F | Resp 18 | Ht 72.0 in | Wt 187.2 lb

## 2021-05-15 DIAGNOSIS — Z5112 Encounter for antineoplastic immunotherapy: Secondary | ICD-10-CM | POA: Diagnosis not present

## 2021-05-15 DIAGNOSIS — C9002 Multiple myeloma in relapse: Secondary | ICD-10-CM | POA: Diagnosis not present

## 2021-05-15 DIAGNOSIS — C9000 Multiple myeloma not having achieved remission: Secondary | ICD-10-CM

## 2021-05-15 LAB — CMP (CANCER CENTER ONLY)
ALT: 16 U/L (ref 0–44)
AST: 15 U/L (ref 15–41)
Albumin: 4.2 g/dL (ref 3.5–5.0)
Alkaline Phosphatase: 35 U/L — ABNORMAL LOW (ref 38–126)
Anion gap: 5 (ref 5–15)
BUN: 20 mg/dL (ref 8–23)
CO2: 28 mmol/L (ref 22–32)
Calcium: 9.5 mg/dL (ref 8.9–10.3)
Chloride: 105 mmol/L (ref 98–111)
Creatinine: 0.83 mg/dL (ref 0.61–1.24)
GFR, Estimated: >60 mL/min (ref >60)
Glucose, Bld: 103 mg/dL — ABNORMAL HIGH (ref 70–99)
Potassium: 4 mmol/L (ref 3.5–5.1)
Sodium: 138 mmol/L (ref 135–145)
Total Bilirubin: 0.5 mg/dL (ref 0.3–1.2)
Total Protein: 6.5 g/dL (ref 6.5–8.1)

## 2021-05-15 LAB — CBC WITH DIFFERENTIAL (CANCER CENTER ONLY)
Abs Immature Granulocytes: 0.02 10*3/uL (ref 0.00–0.07)
Basophils Absolute: 0 10*3/uL (ref 0.0–0.1)
Basophils Relative: 0 %
Eosinophils Absolute: 0.7 10*3/uL — ABNORMAL HIGH (ref 0.0–0.5)
Eosinophils Relative: 8 %
HCT: 35.8 % — ABNORMAL LOW (ref 39.0–52.0)
Hemoglobin: 11.8 g/dL — ABNORMAL LOW (ref 13.0–17.0)
Immature Granulocytes: 0 %
Lymphocytes Relative: 8 %
Lymphs Abs: 0.7 10*3/uL (ref 0.7–4.0)
MCH: 33.6 pg (ref 26.0–34.0)
MCHC: 33 g/dL (ref 30.0–36.0)
MCV: 102 fL — ABNORMAL HIGH (ref 80.0–100.0)
Monocytes Absolute: 1.1 10*3/uL — ABNORMAL HIGH (ref 0.1–1.0)
Monocytes Relative: 12 %
Neutro Abs: 6.4 10*3/uL (ref 1.7–7.7)
Neutrophils Relative %: 72 %
Platelet Count: 249 10*3/uL (ref 150–400)
RBC: 3.51 MIL/uL — ABNORMAL LOW (ref 4.22–5.81)
RDW: 13.9 % (ref 11.5–15.5)
WBC Count: 8.8 10*3/uL (ref 4.0–10.5)
nRBC: 0 % (ref 0.0–0.2)

## 2021-05-15 MED ORDER — DEXAMETHASONE 4 MG PO TABS
20.0000 mg | ORAL_TABLET | Freq: Once | ORAL | Status: AC
  Administered 2021-05-15: 20 mg via ORAL
  Filled 2021-05-15: qty 5

## 2021-05-15 MED ORDER — DARATUMUMAB-HYALURONIDASE-FIHJ 1800-30000 MG-UT/15ML ~~LOC~~ SOLN
1800.0000 mg | Freq: Once | SUBCUTANEOUS | Status: AC
  Administered 2021-05-15: 1800 mg via SUBCUTANEOUS
  Filled 2021-05-15: qty 15

## 2021-05-15 MED ORDER — ACETAMINOPHEN 325 MG PO TABS
650.0000 mg | ORAL_TABLET | Freq: Once | ORAL | Status: AC
  Administered 2021-05-15: 650 mg via ORAL
  Filled 2021-05-15: qty 2

## 2021-05-15 MED ORDER — DIPHENHYDRAMINE HCL 25 MG PO CAPS
50.0000 mg | ORAL_CAPSULE | Freq: Once | ORAL | Status: AC
  Administered 2021-05-15: 50 mg via ORAL
  Filled 2021-05-15: qty 2

## 2021-05-15 NOTE — Patient Instructions (Signed)
[facility]  Discharge Instructions: Thank you for choosing [facility] to provide your oncology and hematology care.   If you have a lab appointment with the Cancer Center, please go directly to the Cancer Center and check in at the registration area.   Wear comfortable clothing and clothing appropriate for easy access to any Portacath or PICC line.   We strive to give you quality time with your provider. You may need to reschedule your appointment if you arrive late (15 or more minutes).  Arriving late affects you and other patients whose appointments are after yours.  Also, if you miss three or more appointments without notifying the office, you may be dismissed from the clinic at the provider's discretion.      For prescription refill requests, have your pharmacy contact our office and allow 72 hours for refills to be completed.    Today you received the following chemotherapy and/or immunotherapy agents: Darzalex Faspro      To help prevent nausea and vomiting after your treatment, we encourage you to take your nausea medication as directed.  BELOW ARE SYMPTOMS THAT SHOULD BE REPORTED IMMEDIATELY: *FEVER GREATER THAN 100.4 F (38 C) OR HIGHER *CHILLS OR SWEATING *NAUSEA AND VOMITING THAT IS NOT CONTROLLED WITH YOUR NAUSEA MEDICATION *UNUSUAL SHORTNESS OF BREATH *UNUSUAL BRUISING OR BLEEDING *URINARY PROBLEMS (pain or burning when urinating, or frequent urination) *BOWEL PROBLEMS (unusual diarrhea, constipation, pain near the anus) TENDERNESS IN MOUTH AND THROAT WITH OR WITHOUT PRESENCE OF ULCERS (sore throat, sores in mouth, or a toothache) UNUSUAL RASH, SWELLING OR PAIN  UNUSUAL VAGINAL DISCHARGE OR ITCHING   Items with * indicate a potential emergency and should be followed up as soon as possible or go to the Emergency Department if any problems should occur.  Please show the CHEMOTHERAPY ALERT CARD or IMMUNOTHERAPY ALERT CARD at  check-in to the Emergency Department and triage nurse.  Should you have questions after your visit or need to cancel or reschedule your appointment, please contact [facility]  Dept: [phone]  and follow the prompts.  Office hours are 8:00 a.m. to 4:30 p.m. Monday - Friday. Please note that voicemails left after 4:00 p.m. may not be returned until the following business day.  We are closed weekends and major holidays. You have access to a nurse at all times for urgent questions. Please call the main number to the clinic Dept: [phone] and follow the prompts.   For any non-urgent questions, you may also contact your provider using MyChart. We now offer e-Visits for anyone 18 and older to request care online for non-urgent symptoms. For details visit [email or web address].   Also download the MyChart app! Go to the app store, search "MyChart", open the app, select [place], and log in with your MyChart username and password.  Due to Covid, a mask is required upon entering the hospital/clinic. If you do not have a mask, one will be given to you upon arrival. For doctor visits, patients may have 1 support person aged 18 or older with them. For treatment visits, patients cannot have anyone with them due to current Covid guidelines and our immunocompromised population.  

## 2021-05-20 ENCOUNTER — Telehealth: Payer: Self-pay | Admitting: Cardiovascular Disease

## 2021-05-20 NOTE — Telephone Encounter (Signed)
Patient is returning RN's call.

## 2021-05-20 NOTE — Telephone Encounter (Signed)
Spoke with pt regarding echo results and Dr. Kennon Holter recommendations to see him back in the next couple of weeks. Pt reports feeling good and is would like to push appointment out just a bit before returning to discuss options with Dr. [name]. Office visit scheduled for pt. Pt verbalizes understanding.   ?

## 2021-05-29 ENCOUNTER — Encounter: Payer: Self-pay | Admitting: Oncology

## 2021-05-29 ENCOUNTER — Other Ambulatory Visit: Payer: Self-pay

## 2021-05-29 ENCOUNTER — Inpatient Hospital Stay: Payer: Medicare Other

## 2021-05-29 VITALS — BP 133/73 | HR 64 | Temp 98.1°F | Resp 16 | Wt 181.0 lb

## 2021-05-29 DIAGNOSIS — C9000 Multiple myeloma not having achieved remission: Secondary | ICD-10-CM

## 2021-05-29 DIAGNOSIS — C9002 Multiple myeloma in relapse: Secondary | ICD-10-CM | POA: Diagnosis not present

## 2021-05-29 DIAGNOSIS — Z5112 Encounter for antineoplastic immunotherapy: Secondary | ICD-10-CM | POA: Diagnosis not present

## 2021-05-29 LAB — CBC WITH DIFFERENTIAL (CANCER CENTER ONLY)
Abs Immature Granulocytes: 0.01 10*3/uL (ref 0.00–0.07)
Basophils Absolute: 0 10*3/uL (ref 0.0–0.1)
Basophils Relative: 1 %
Eosinophils Absolute: 0.6 10*3/uL — ABNORMAL HIGH (ref 0.0–0.5)
Eosinophils Relative: 9 %
HCT: 34.8 % — ABNORMAL LOW (ref 39.0–52.0)
Hemoglobin: 11.8 g/dL — ABNORMAL LOW (ref 13.0–17.0)
Immature Granulocytes: 0 %
Lymphocytes Relative: 11 %
Lymphs Abs: 0.7 10*3/uL (ref 0.7–4.0)
MCH: 33.6 pg (ref 26.0–34.0)
MCHC: 33.9 g/dL (ref 30.0–36.0)
MCV: 99.1 fL (ref 80.0–100.0)
Monocytes Absolute: 0.7 10*3/uL (ref 0.1–1.0)
Monocytes Relative: 11 %
Neutro Abs: 4.7 10*3/uL (ref 1.7–7.7)
Neutrophils Relative %: 68 %
Platelet Count: 257 10*3/uL (ref 150–400)
RBC: 3.51 MIL/uL — ABNORMAL LOW (ref 4.22–5.81)
RDW: 13 % (ref 11.5–15.5)
WBC Count: 6.7 10*3/uL (ref 4.0–10.5)
nRBC: 0 % (ref 0.0–0.2)

## 2021-05-29 LAB — CMP (CANCER CENTER ONLY)
ALT: 12 U/L (ref 0–44)
AST: 12 U/L — ABNORMAL LOW (ref 15–41)
Albumin: 4.2 g/dL (ref 3.5–5.0)
Alkaline Phosphatase: 29 U/L — ABNORMAL LOW (ref 38–126)
Anion gap: 6 (ref 5–15)
BUN: 21 mg/dL (ref 8–23)
CO2: 27 mmol/L (ref 22–32)
Calcium: 9.4 mg/dL (ref 8.9–10.3)
Chloride: 106 mmol/L (ref 98–111)
Creatinine: 0.93 mg/dL (ref 0.61–1.24)
GFR, Estimated: >60 mL/min (ref >60)
Glucose, Bld: 111 mg/dL — ABNORMAL HIGH (ref 70–99)
Potassium: 3.9 mmol/L (ref 3.5–5.1)
Sodium: 139 mmol/L (ref 135–145)
Total Bilirubin: 0.6 mg/dL (ref 0.3–1.2)
Total Protein: 6.4 g/dL — ABNORMAL LOW (ref 6.5–8.1)

## 2021-05-29 MED ORDER — DEXAMETHASONE 4 MG PO TABS
20.0000 mg | ORAL_TABLET | Freq: Once | ORAL | Status: AC
  Administered 2021-05-29: 20 mg via ORAL
  Filled 2021-05-29: qty 5

## 2021-05-29 MED ORDER — DARATUMUMAB-HYALURONIDASE-FIHJ 1800-30000 MG-UT/15ML ~~LOC~~ SOLN
1800.0000 mg | Freq: Once | SUBCUTANEOUS | Status: AC
  Administered 2021-05-29: 1800 mg via SUBCUTANEOUS
  Filled 2021-05-29: qty 15

## 2021-05-29 MED ORDER — ACETAMINOPHEN 325 MG PO TABS
650.0000 mg | ORAL_TABLET | Freq: Once | ORAL | Status: AC
  Administered 2021-05-29: 650 mg via ORAL
  Filled 2021-05-29: qty 2

## 2021-05-29 MED ORDER — DIPHENHYDRAMINE HCL 25 MG PO CAPS
50.0000 mg | ORAL_CAPSULE | Freq: Once | ORAL | Status: AC
  Administered 2021-05-29: 50 mg via ORAL
  Filled 2021-05-29: qty 2

## 2021-05-29 NOTE — Patient Instructions (Signed)
[facility]   Discharge Instructions: Thank you for choosing [facility] to provide your oncology and hematology care.   If you have a lab appointment with the Cancer Center, please go directly to the Cancer Center and check in at the registration area.   Wear comfortable clothing and clothing appropriate for easy access to any Portacath or PICC line.   We strive to give you quality time with your provider. You may need to reschedule your appointment if you arrive late (15 or more minutes).  Arriving late affects you and other patients whose appointments are after yours.  Also, if you miss three or more appointments without notifying the office, you may be dismissed from the clinic at the provider's discretion.      For prescription refill requests, have your pharmacy contact our office and allow 72 hours for refills to be completed.    Today you received the following chemotherapy and/or immunotherapy agents: daratumumab-hyaluronidase-fihj      To help prevent nausea and vomiting after your treatment, we encourage you to take your nausea medication as directed.  BELOW ARE SYMPTOMS THAT SHOULD BE REPORTED IMMEDIATELY: *FEVER GREATER THAN 100.4 F (38 C) OR HIGHER *CHILLS OR SWEATING *NAUSEA AND VOMITING THAT IS NOT CONTROLLED WITH YOUR NAUSEA MEDICATION *UNUSUAL SHORTNESS OF BREATH *UNUSUAL BRUISING OR BLEEDING *URINARY PROBLEMS (pain or burning when urinating, or frequent urination) *BOWEL PROBLEMS (unusual diarrhea, constipation, pain near the anus) TENDERNESS IN MOUTH AND THROAT WITH OR WITHOUT PRESENCE OF ULCERS (sore throat, sores in mouth, or a toothache) UNUSUAL RASH, SWELLING OR PAIN  UNUSUAL VAGINAL DISCHARGE OR ITCHING   Items with * indicate a potential emergency and should be followed up as soon as possible or go to the Emergency Department if any problems should occur.  Please show the CHEMOTHERAPY ALERT CARD or IMMUNOTHERAPY  ALERT CARD at check-in to the Emergency Department and triage nurse.  Should you have questions after your visit or need to cancel or reschedule your appointment, please contact [facility]  Dept: [phone]  and follow the prompts.  Office hours are 8:00 a.m. to 4:30 p.m. Monday - Friday. Please note that voicemails left after 4:00 p.m. may not be returned until the following business day.  We are closed weekends and major holidays. You have access to a nurse at all times for urgent questions. Please call the main number to the clinic Dept: [phone] and follow the prompts.   For any non-urgent questions, you may also contact your provider using MyChart. We now offer e-Visits for anyone 18 and older to request care online for non-urgent symptoms. For details visit [email or web address].   Also download the MyChart app! Go to the app store, search "MyChart", open the app, select [facility], and log in with your MyChart username and password.  Due to Covid, a mask is required upon entering the hospital/clinic. If you do not have a mask, one will be given to you upon arrival. For doctor visits, patients may have 1 support person aged 18 or older with them. For treatment visits, patients cannot have anyone with them due to current Covid guidelines and our immunocompromised population.  

## 2021-05-30 LAB — KAPPA/LAMBDA LIGHT CHAINS
Kappa free light chain: 7.6 mg/L (ref 3.3–19.4)
Kappa, lambda light chain ratio: 1.9 — ABNORMAL HIGH (ref 0.26–1.65)
Lambda free light chains: 4 mg/L — ABNORMAL LOW (ref 5.7–26.3)

## 2021-05-31 DIAGNOSIS — N401 Enlarged prostate with lower urinary tract symptoms: Secondary | ICD-10-CM | POA: Diagnosis not present

## 2021-05-31 DIAGNOSIS — R350 Frequency of micturition: Secondary | ICD-10-CM | POA: Diagnosis not present

## 2021-06-02 LAB — MULTIPLE MYELOMA PANEL, SERUM
Albumin SerPl Elph-Mcnc: 3.6 g/dL (ref 2.9–4.4)
Albumin/Glob SerPl: 1.8 — ABNORMAL HIGH (ref 0.7–1.7)
Alpha 1: 0.2 g/dL (ref 0.0–0.4)
Alpha2 Glob SerPl Elph-Mcnc: 0.6 g/dL (ref 0.4–1.0)
B-Globulin SerPl Elph-Mcnc: 0.7 g/dL (ref 0.7–1.3)
Gamma Glob SerPl Elph-Mcnc: 0.6 g/dL (ref 0.4–1.8)
Globulin, Total: 2.1 g/dL — ABNORMAL LOW (ref 2.2–3.9)
IgA: 32 mg/dL — ABNORMAL LOW (ref 61–437)
IgG (Immunoglobin G), Serum: 663 mg/dL (ref 603–1613)
IgM (Immunoglobulin M), Srm: 5 mg/dL — ABNORMAL LOW (ref 15–143)
M Protein SerPl Elph-Mcnc: 0.1 g/dL — ABNORMAL HIGH
Total Protein ELP: 5.7 g/dL — ABNORMAL LOW (ref 6.0–8.5)

## 2021-06-07 ENCOUNTER — Ambulatory Visit (INDEPENDENT_AMBULATORY_CARE_PROVIDER_SITE_OTHER): Payer: Medicare Other | Admitting: Cardiovascular Disease

## 2021-06-07 ENCOUNTER — Encounter: Payer: Self-pay | Admitting: Cardiovascular Disease

## 2021-06-07 DIAGNOSIS — I5020 Unspecified systolic (congestive) heart failure: Secondary | ICD-10-CM

## 2021-06-07 DIAGNOSIS — R002 Palpitations: Secondary | ICD-10-CM

## 2021-06-07 DIAGNOSIS — E782 Mixed hyperlipidemia: Secondary | ICD-10-CM

## 2021-06-07 DIAGNOSIS — E785 Hyperlipidemia, unspecified: Secondary | ICD-10-CM | POA: Insufficient documentation

## 2021-06-07 MED ORDER — CARVEDILOL 6.25 MG PO TABS: 9.3750 mg | ORAL_TABLET | Freq: Two times a day (BID) | ORAL | 3 refills | Status: DC

## 2021-06-07 NOTE — Assessment & Plan Note (Signed)
History of mild hyperlipidemia not on statin therapy.  We will recheck a lipid liver profile. ?

## 2021-06-07 NOTE — Patient Instructions (Signed)
Medication Instructions:  ? ?-Increase carvedilol (coreg) to 9.'375mg'$  twice daily. ? ?*If you need a refill on your cardiac medications before your next appointment, please call your pharmacy* ? ? ?Lab Work: ?Your physician recommends that you return for lab work in: next week or 2 for FASTING lipid/liver profile ? ?If you have labs (blood work) drawn today and your tests are completely normal, you will receive your results only by: ?MyChart Message (if you have MyChart) OR ?A paper copy in the mail ?If you have any lab test that is abnormal or we need to change your treatment, we will call you to review the results. ? ? ?Testing/Procedures: ?Your physician has requested that you have an echocardiogram. Echocardiography is a painless test that uses sound waves to create images of your heart. It provides your doctor with information about the size and shape of your heart and how well your heart?s chambers and valves are working. This procedure takes approximately one hour. There are no restrictions for this procedure. To be done in Aug. This procedure will be done at [address] ? ? ? ?Follow-Up: ?At [facility], you and your health needs are our priority.  As part of our continuing mission to provide you with exceptional heart care, we have created designated Provider Care Teams.  These Care Teams include your primary Cardiologist (physician) and Advanced Practice Providers (APPs -  Physician Assistants and Nurse Practitioners) who all work together to provide you with the care you need, when you need it. ? ?We recommend signing up for the patient portal called "MyChart".  Sign up information is provided on this After Visit Summary.  MyChart is used to connect with patients for Virtual Visits (Telemedicine).  Patients are able to view lab/test results, encounter notes, upcoming appointments, etc.  Non-urgent messages can be sent to your provider as well.   ?To learn more about what you can do with  MyChart, go to [email or web address].   ? ?Your next appointment:   ?4 month(s) ? ?The format for your next appointment:   ?In Person ? ?Provider:   ?Quay Burow, MD  ? ? ? ?Other Instructions ?Dr. [name] has requested that you schedule an appointment with one of our clinical pharmacists for a blood pressure check appointment within the next 4 weeks.  ?If you monitor your blood pressure (BP) at home, please bring your BP cuff and your BP readings with you to this appointment ? ?HOW TO TAKE YOUR BLOOD PRESSURE: ?Rest 5 minutes before taking your blood pressure. ?Don?t smoke or drink caffeinated beverages for at least 30 minutes before. ?Take your blood pressure before (not after) you eat. ?Sit comfortably with your back supported and both feet on the floor (don?t cross your legs). ?Elevate your arm to heart level on a table or a desk. ?Use the proper sized cuff. It should fit smoothly and snugly around your bare upper arm. There should be enough room to slip a fingertip under the cuff. The bottom edge of the cuff should be 1 inch above the crease of the elbow. ?Ideally, take 3 measurements at one sitting and record the average. ?

## 2021-06-07 NOTE — Assessment & Plan Note (Signed)
History of palpitations primarily when his hemoglobin was low.  He is cut his caffeine in half and his hemoglobin has been stable.  Palpitations have resolved. ?

## 2021-06-07 NOTE — Progress Notes (Signed)
? ? ? ?06/07/2021 ?[name]   ?23-Apr-1950  ?[id number] ? ?Primary Physician Benito Mccreedy, MD ?Primary Cardiologist: Lorretta Harp MD Lupe Carney, [place] ? ?HPI:  Alexander Saunders is a 71 y.o.    fit appearing married African-American male father of 2 with no grandchildren who was referred by [facility] because of an abnormal EKG. 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  I last saw him in the office 04/19/2021.  Today.  He has no cardiac risk factors. He saw Dr. Rex Kras remotely because of an abnormal EKG and has had functional testing. Unfortunately has multiple myeloma and is scheduled to have a stem cell transplant. A 2-D echo performed 04/15/17 was normal except for mild concentric LVH. He exercises regularly and walks 2 miles a day with his wife. Does have peripheral neuropathy probably from chemotherapy. ? ?He had a stem cell transplant but has relapsed within the last year and is getting chemotherapy.  He noticed resting tachycardia when his hemoglobin dropped in the 6 range along with palpitations which have resolved since his hemoglobin is back in the 11 range.  He was drinking several cups of coffee a day as well, but has since cut this in half.  He denies chest pain or shortness of breath. ?  ? ?I obtained a 2D echocardiogram on 04/29/2021 that showed moderately severe LV dysfunction, LV dilatation, moderate to severe MR and a small pericardial effusion.  It was thought that his MR was functional.  He did see Dr. Glenford Bayley that day who thought that his LV dysfunction may have been related to his chemotherapy for his recurrent multiple myeloma.  He began him on guideline directed optimal medical therapy for LV dysfunction.  A repeat echo performed/10/23 showed only mild MR suggesting that this was a functional.  He is completely asymptomatic and actually very active exercising 4 to 6 days a week without symptoms. ? ? ?Current  Meds  ?Medication Sig  ? acyclovir (ZOVIRAX) 400 MG tablet Take 1 tablet (400 mg total) by mouth daily.  ? aspirin EC 81 MG tablet Take 81 mg by mouth daily.  ? B Complex Vitamins (B-COMPLEX/B-12) TABS Take 1 tablet by mouth daily.  ? bisacodyl (DULCOLAX) 5 MG EC tablet Take 5 mg by mouth daily as needed for moderate constipation.  ? carboxymethylcellulose (REFRESH PLUS) 0.5 % SOLN Place 1 drop into both eyes 3 (three) times daily as needed (dry eyes).  ? carvedilol (COREG) 6.25 MG tablet Take 1 tablet (6.25 mg total) by mouth 2 (two) times daily.  ? cholecalciferol (VITAMIN D3) 25 MCG (1000 UNIT) tablet Take 1,000 Units by mouth daily.  ? cyclobenzaprine (FLEXERIL) 5 MG tablet Take 1 tablet (5 mg total) by mouth at bedtime as needed for muscle spasms.  ? diphenhydramine-acetaminophen (TYLENOL PM EXTRA STRENGTH) 25-500 MG TABS tablet Take 1 tablet by mouth at bedtime as needed (pain).  ? gabapentin (NEURONTIN) 300 MG capsule TAKE 3 CAPSULE BY MOUTH THREE TIMES DAILY. 3 CAPSULE IN THE MORNING 3 CAPSULE IN THE AFTERNOON AND 3 CAPSULE AT NIGHT  ? losartan (COZAAR) 25 MG tablet Take 25 mg by mouth daily.  ? Magnesium 100 MG TABS Take 100 mg by mouth daily.  ? Multiple Vitamin (MULTIVITAMIN) capsule Take 1 capsule by mouth daily.  ? sodium chloride (OCEAN) 0.65 % SOLN nasal spray Place 1 spray into both nostrils daily as needed for congestion.  ? traMADol Veatrice Bourbon)  50 MG tablet Take 1 tablet (50 mg total) by mouth every 6 (six) hours as needed.  ?  ? ?No Known Allergies ? ?Social History  ? ?Socioeconomic History  ? Marital status: Married  ?  Spouse name: Not on file  ? Number of children: Not on file  ? Years of education: Not on file  ? Highest education level: Not on file  ?Occupational History  ? Not on file  ?Tobacco Use  ? Smoking status: Never  ? Smokeless tobacco: Never  ?Vaping Use  ? Vaping Use: Never used  ?Substance and Sexual Activity  ? Alcohol use: Yes  ?  Comment: occ  ? Drug use: Never  ? Sexual  activity: Not on file  ?Other Topics Concern  ? Not on file  ?Social History Narrative  ? He worked as a Barrister's clerk  ? Highest level of education:  college  ? He is back in college at [employer] studying exercise physiology.   ?   ? Right handed  ?   ? Single story home  ? ?Social Determinants of Health  ? ?Financial Resource Strain: Not on file  ?Food Insecurity: Not on file  ?Transportation Needs: Not on file  ?Physical Activity: Not on file  ?Stress: Not on file  ?Social Connections: Not on file  ?Intimate Partner Violence: Not on file  ?  ? ?Review of Systems: ?General: negative for chills, fever, night sweats or weight changes.  ?Cardiovascular: negative for chest pain, dyspnea on exertion, edema, orthopnea, palpitations, paroxysmal nocturnal dyspnea or shortness of breath ?Dermatological: negative for rash ?Respiratory: negative for cough or wheezing ?Urologic: negative for hematuria ?Abdominal: negative for nausea, vomiting, diarrhea, bright red blood per rectum, melena, or hematemesis ?Neurologic: negative for visual changes, syncope, or dizziness ?All other systems reviewed and are otherwise negative except as noted above. ? ? ? ?Blood pressure 115/70, pulse 70, height 6' (1.829 m), weight 194 lb 3.2 oz (88.1 kg), SpO2 97 %.  ?General appearance: alert and no distress ?Neck: no adenopathy, no carotid bruit, no JVD, supple, symmetrical, trachea midline, and thyroid not enlarged, symmetric, no tenderness/mass/nodules ?Lungs: clear to auscultation bilaterally ?Heart: regular rate and rhythm, S1, S2 normal, no murmur, click, rub or gallop ?Extremities: extremities normal, atraumatic, no cyanosis or edema ?Pulses: 2+ and symmetric ?Skin: Skin color, texture, turgor normal. No rashes or lesions ?Neurologic: Grossly normal ? ?EKG not performed today ? ?ASSESSMENT AND PLAN:  ? ?Palpitations ?History of palpitations primarily when his hemoglobin was low.  He is cut his caffeine in half and his hemoglobin has been  stable.  Palpitations have resolved. ? ?HFrEF (heart failure with reduced ejection fraction) ([place]) ?Recent echo showed EF in the 25 to 30% range with a moderately dilated left ventricular cavity and segmental wall motion abnormalities.  He did initially have moderate MR but most recently on 05/13/2021 his MR was only mild.  He also had a small pericardial effusion.  He saw Dr.Chandresahker who began him on carvedilol low-dose which he is tolerating.  I am going to uptitrate his carvedilol.  In addition, he is on low-dose losartan.  He is a relatively low blood pressure.  He is very active and completely asymptomatic.  We will uptitrate his guideline directed optimal medical therapy to maximum tolerated dose and we will recheck a 2D echo in 3 months.  I suspect this is a nonischemic cardiomyopathy from his chemotherapy related to his multiple myeloma. ? ?Hyperlipidemia ?History of mild hyperlipidemia not on statin  therapy.  We will recheck a lipid liver profile. ? ? ? ? ?Lorretta Harp MD FACP,FACC,FAHA, FSCAI ?06/07/2021 ?8:53 AM ?

## 2021-06-07 NOTE — Assessment & Plan Note (Signed)
Recent echo showed EF in the 25 to 30% range with a moderately dilated left ventricular cavity and segmental wall motion abnormalities.  He did initially have moderate MR but most recently on 05/13/2021 his MR was only mild.  He also had a small pericardial effusion.  He saw Dr.Chandresahker who began him on carvedilol low-dose which he is tolerating.  I am going to uptitrate his carvedilol.  In addition, he is on low-dose losartan.  He is a relatively low blood pressure.  He is very active and completely asymptomatic.  We will uptitrate his guideline directed optimal medical therapy to maximum tolerated dose and we will recheck a 2D echo in 3 months.  I suspect this is a nonischemic cardiomyopathy from his chemotherapy related to his multiple myeloma. ?

## 2021-06-11 ENCOUNTER — Telehealth: Payer: Self-pay | Admitting: Oncology

## 2021-06-11 NOTE — Telephone Encounter (Signed)
Called patient regarding upcoming appointment, patient is notified. °

## 2021-06-12 ENCOUNTER — Inpatient Hospital Stay: Payer: Medicare Other | Attending: Oncology | Admitting: Oncology

## 2021-06-12 ENCOUNTER — Inpatient Hospital Stay: Payer: Medicare Other

## 2021-06-12 ENCOUNTER — Other Ambulatory Visit: Payer: Self-pay

## 2021-06-12 VITALS — BP 129/78 | HR 71 | Temp 97.7°F | Resp 17 | Ht 72.0 in | Wt 190.1 lb

## 2021-06-12 DIAGNOSIS — C9000 Multiple myeloma not having achieved remission: Secondary | ICD-10-CM

## 2021-06-12 DIAGNOSIS — G629 Polyneuropathy, unspecified: Secondary | ICD-10-CM | POA: Insufficient documentation

## 2021-06-12 DIAGNOSIS — Z5112 Encounter for antineoplastic immunotherapy: Secondary | ICD-10-CM | POA: Diagnosis not present

## 2021-06-12 DIAGNOSIS — D649 Anemia, unspecified: Secondary | ICD-10-CM | POA: Insufficient documentation

## 2021-06-12 DIAGNOSIS — I429 Cardiomyopathy, unspecified: Secondary | ICD-10-CM | POA: Insufficient documentation

## 2021-06-12 DIAGNOSIS — C9002 Multiple myeloma in relapse: Secondary | ICD-10-CM | POA: Insufficient documentation

## 2021-06-12 LAB — CBC WITH DIFFERENTIAL (CANCER CENTER ONLY)
Abs Immature Granulocytes: 0.02 10*3/uL (ref 0.00–0.07)
Basophils Absolute: 0 10*3/uL (ref 0.0–0.1)
Basophils Relative: 1 %
Eosinophils Absolute: 0.5 10*3/uL (ref 0.0–0.5)
Eosinophils Relative: 7 %
HCT: 38.1 % — ABNORMAL LOW (ref 39.0–52.0)
Hemoglobin: 12.9 g/dL — ABNORMAL LOW (ref 13.0–17.0)
Immature Granulocytes: 0 %
Lymphocytes Relative: 10 %
Lymphs Abs: 0.7 10*3/uL (ref 0.7–4.0)
MCH: 33.8 pg (ref 26.0–34.0)
MCHC: 33.9 g/dL (ref 30.0–36.0)
MCV: 99.7 fL (ref 80.0–100.0)
Monocytes Absolute: 0.8 10*3/uL (ref 0.1–1.0)
Monocytes Relative: 12 %
Neutro Abs: 4.7 10*3/uL (ref 1.7–7.7)
Neutrophils Relative %: 70 %
Platelet Count: 237 10*3/uL (ref 150–400)
RBC: 3.82 MIL/uL — ABNORMAL LOW (ref 4.22–5.81)
RDW: 12.8 % (ref 11.5–15.5)
WBC Count: 6.7 10*3/uL (ref 4.0–10.5)
nRBC: 0 % (ref 0.0–0.2)

## 2021-06-12 LAB — CMP (CANCER CENTER ONLY)
ALT: 13 U/L (ref 0–44)
AST: 14 U/L — ABNORMAL LOW (ref 15–41)
Albumin: 4.3 g/dL (ref 3.5–5.0)
Alkaline Phosphatase: 29 U/L — ABNORMAL LOW (ref 38–126)
Anion gap: 4 — ABNORMAL LOW (ref 5–15)
BUN: 17 mg/dL (ref 8–23)
CO2: 31 mmol/L (ref 22–32)
Calcium: 9.8 mg/dL (ref 8.9–10.3)
Chloride: 105 mmol/L (ref 98–111)
Creatinine: 1 mg/dL (ref 0.61–1.24)
GFR, Estimated: >60 mL/min (ref >60)
Glucose, Bld: 103 mg/dL — ABNORMAL HIGH (ref 70–99)
Potassium: 4.5 mmol/L (ref 3.5–5.1)
Sodium: 140 mmol/L (ref 135–145)
Total Bilirubin: 0.6 mg/dL (ref 0.3–1.2)
Total Protein: 6.8 g/dL (ref 6.5–8.1)

## 2021-06-12 MED ORDER — ACETAMINOPHEN 325 MG PO TABS
650.0000 mg | ORAL_TABLET | Freq: Once | ORAL | Status: AC
  Administered 2021-06-12: 650 mg via ORAL
  Filled 2021-06-12: qty 2

## 2021-06-12 MED ORDER — DARATUMUMAB-HYALURONIDASE-FIHJ 1800-30000 MG-UT/15ML ~~LOC~~ SOLN
1800.0000 mg | Freq: Once | SUBCUTANEOUS | Status: AC
  Administered 2021-06-12: 1800 mg via SUBCUTANEOUS
  Filled 2021-06-12: qty 15

## 2021-06-12 MED ORDER — DIPHENHYDRAMINE HCL 25 MG PO CAPS
50.0000 mg | ORAL_CAPSULE | Freq: Once | ORAL | Status: AC
  Administered 2021-06-12: 50 mg via ORAL
  Filled 2021-06-12: qty 2

## 2021-06-12 MED ORDER — DEXAMETHASONE 4 MG PO TABS
20.0000 mg | ORAL_TABLET | Freq: Once | ORAL | Status: AC
  Administered 2021-06-12: 20 mg via ORAL
  Filled 2021-06-12: qty 5

## 2021-06-12 NOTE — Patient Instructions (Signed)
[facility]  Discharge Instructions: Thank you for choosing [facility] to provide your oncology and hematology care.   If you have a lab appointment with the Cancer Center, please go directly to the Cancer Center and check in at the registration area.   Wear comfortable clothing and clothing appropriate for easy access to any Portacath or PICC line.   We strive to give you quality time with your provider. You may need to reschedule your appointment if you arrive late (15 or more minutes).  Arriving late affects you and other patients whose appointments are after yours.  Also, if you miss three or more appointments without notifying the office, you may be dismissed from the clinic at the provider's discretion.      For prescription refill requests, have your pharmacy contact our office and allow 72 hours for refills to be completed.    Today you received the following chemotherapy and/or immunotherapy agents: Darzalex Faspro      To help prevent nausea and vomiting after your treatment, we encourage you to take your nausea medication as directed.  BELOW ARE SYMPTOMS THAT SHOULD BE REPORTED IMMEDIATELY: *FEVER GREATER THAN 100.4 F (38 C) OR HIGHER *CHILLS OR SWEATING *NAUSEA AND VOMITING THAT IS NOT CONTROLLED WITH YOUR NAUSEA MEDICATION *UNUSUAL SHORTNESS OF BREATH *UNUSUAL BRUISING OR BLEEDING *URINARY PROBLEMS (pain or burning when urinating, or frequent urination) *BOWEL PROBLEMS (unusual diarrhea, constipation, pain near the anus) TENDERNESS IN MOUTH AND THROAT WITH OR WITHOUT PRESENCE OF ULCERS (sore throat, sores in mouth, or a toothache) UNUSUAL RASH, SWELLING OR PAIN  UNUSUAL VAGINAL DISCHARGE OR ITCHING   Items with * indicate a potential emergency and should be followed up as soon as possible or go to the Emergency Department if any problems should occur.  Please show the CHEMOTHERAPY ALERT CARD or IMMUNOTHERAPY ALERT CARD at  check-in to the Emergency Department and triage nurse.  Should you have questions after your visit or need to cancel or reschedule your appointment, please contact [facility]  Dept: [phone]  and follow the prompts.  Office hours are 8:00 a.m. to 4:30 p.m. Monday - Friday. Please note that voicemails left after 4:00 p.m. may not be returned until the following business day.  We are closed weekends and major holidays. You have access to a nurse at all times for urgent questions. Please call the main number to the clinic Dept: [phone] and follow the prompts.   For any non-urgent questions, you may also contact your provider using MyChart. We now offer e-Visits for anyone 18 and older to request care online for non-urgent symptoms. For details visit [email or web address].   Also download the MyChart app! Go to the app store, search "MyChart", open the app, select , and log in with your MyChart username and password.  Due to Covid, a mask is required upon entering the hospital/clinic. If you do not have a mask, one will be given to you upon arrival. For doctor visits, patients may have 1 support person aged 18 or older with them. For treatment visits, patients cannot have anyone with them due to current Covid guidelines and our immunocompromised population.  

## 2021-06-12 NOTE — Progress Notes (Signed)
Hematology and Oncology Follow Up Visit ? ?Alexander Saunders ?[id number] ?October 07, 1950 71 y.o. ?06/12/2021 8:18 AM ?Alexander Saunders, MDOsei-Bonsu, Alexander Beard, MD  ? ?Principle Diagnosis: 71 year old man with multiple myeloma diagnosed in 2018.  He had developed kappa free light chain with relapsed disease in 2022.   ? ? ?Prior Therapy: ? ?He is status post surgical decompression of an epidural mass on 09/04/2016 and the pathology showed a plasma cell neoplasm. ? ?He is S/P adjuvant radiation therapy to the thoracic spine to be completed on 10/14/2016. ? ?Velcade 1.5 mg/m? weekly with dexamethasone 20 mg and Cytoxan 600 mg po. Therapy started on 10/24/2016.  ? ?High-dose chemotherapy followed by stem cell transplant: Melphalan 200 mg/m2 completed on 06/17/17 followed by autologous stem cell rescue 06/18/17.  He achieved complete response. ? ? ?Zometa 4 mg every 3 months started in February 2020.  Last treatment given in January 2022. ? ? ?Revlimid 10 mg daily for 21 days out of a 28-day started in February 2020.  Therapy discontinued in November 2022 because of worsening anemia. ? ?Carfilzomib 20 mg/m? weekly started on February 19, 2021.  Therapy stopped in March 2023. ?Daratumumab 1800 mg subcutaneous injection weekly started on February 19, 2021. ?Dexamethasone weekly started on February 19, 2021. ? ?Current therapy:  ? ? ?Daratumumab 1800 mg subcutaneous injections per protocol currently every 15 days.  Dexamethasone weekly 20 mg po.  ? ? ? ?Interim History:  Alexander Saunders presents today for a follow-up visit.  Since the last visit, he reports feeling well without any major complaints.  He has tolerated the current treatment without any nausea, vomiting or abdominal pain.  He denies any hospitalizations or illnesses.  His performance status and quality of life remains unchanged he denies any bone pain or fractures.  He denies any worsening neuropathy. ? ? ? ? ?Medications: Reviewed without changes. ?Current Outpatient  Medications  ?Medication Sig Dispense Refill  ? acyclovir (ZOVIRAX) 400 MG tablet Take 1 tablet (400 mg total) by mouth daily. 90 tablet 3  ? aspirin EC 81 MG tablet Take 81 mg by mouth daily.    ? B Complex Vitamins (B-COMPLEX/B-12) TABS Take 1 tablet by mouth daily.    ? bisacodyl (DULCOLAX) 5 MG EC tablet Take 5 mg by mouth daily as needed for moderate constipation.    ? carboxymethylcellulose (REFRESH PLUS) 0.5 % SOLN Place 1 drop into both eyes 3 (three) times daily as needed (dry eyes).    ? carvedilol (COREG) 6.25 MG tablet Take 1.5 tablets (9.375 mg total) by mouth 2 (two) times daily. 270 tablet 3  ? cholecalciferol (VITAMIN D3) 25 MCG (1000 UNIT) tablet Take 1,000 Units by mouth daily.    ? cyclobenzaprine (FLEXERIL) 5 MG tablet Take 1 tablet (5 mg total) by mouth at bedtime as needed for muscle spasms. 30 tablet 1  ? diphenhydramine-acetaminophen (TYLENOL PM EXTRA STRENGTH) 25-500 MG TABS tablet Take 1 tablet by mouth at bedtime as needed (pain).    ? gabapentin (NEURONTIN) 300 MG capsule TAKE 3 CAPSULE BY MOUTH THREE TIMES DAILY. 3 CAPSULE IN THE MORNING 3 CAPSULE IN THE AFTERNOON AND 3 CAPSULE AT NIGHT 270 capsule 3  ? losartan (COZAAR) 25 MG tablet Take 25 mg by mouth daily.    ? Magnesium 100 MG TABS Take 100 mg by mouth daily.    ? Multiple Vitamin (MULTIVITAMIN) capsule Take 1 capsule by mouth daily.    ? sodium chloride (OCEAN) 0.65 % SOLN nasal spray Place 1 spray into both  nostrils daily as needed for congestion.    ? traMADol (ULTRAM) 50 MG tablet Take 1 tablet (50 mg total) by mouth every 6 (six) hours as needed. 30 tablet 1  ? ?No current facility-administered medications for this visit.  ? ? ? ?Allergies: No Known Allergies ? ? ? ?Physical Exam: ? ? ? ? ? ? ? ? ?Blood pressure 129/78, pulse 71, temperature 97.7 ?F (36.5 ?C), temperature source Temporal, resp. rate 17, height 6' (1.829 m), weight 190 lb 1.6 oz (86.2 kg), SpO2 99 %. ? ? ? ? ? ? ? ?ECOG: 1 ? ? ? ? ?General appearance: Alert,  awake without any distress. ?Head: Atraumatic without abnormalities ?Oropharynx: Without any thrush or ulcers. ?Eyes: No scleral icterus. ?Lymph nodes: No lymphadenopathy noted in the cervical, supraclavicular, or axillary nodes ?Heart:regular rate and rhythm, without any murmurs or gallops.   ?Lung: Clear to auscultation without any rhonchi, wheezes or dullness to percussion. ?Abdomin: Soft, nontender without any shifting dullness or ascites. ?Musculoskeletal: No clubbing or cyanosis. ?Neurological: No motor or sensory deficits. ?Skin: No rashes or lesions. ? ? ? ? ? ? ? ? ? ? ? ? ?Lab Results: ?Lab Results  ?Component Value Date  ? WBC 6.7 05/29/2021  ? HGB 11.8 (L) 05/29/2021  ? HCT 34.8 (L) 05/29/2021  ? MCV 99.1 05/29/2021  ? PLT 257 05/29/2021  ? ?  Chemistry   ?   ?Component Value Date/Time  ? NA 139 05/29/2021 1137  ? NA 137 02/06/2017 0828  ? K 3.9 05/29/2021 1137  ? K 4.0 02/06/2017 0828  ? CL 106 05/29/2021 1137  ? CO2 27 05/29/2021 1137  ? CO2 24 02/06/2017 0828  ? BUN 21 05/29/2021 1137  ? BUN 14.9 02/06/2017 0828  ? CREATININE 0.93 05/29/2021 1137  ? CREATININE 0.9 02/06/2017 0828  ?    ?Component Value Date/Time  ? CALCIUM 9.4 05/29/2021 1137  ? CALCIUM 9.1 02/06/2017 0828  ? ALKPHOS 29 (L) 05/29/2021 1137  ? ALKPHOS 31 (L) 02/06/2017 7619  ? AST 12 (L) 05/29/2021 1137  ? AST 32 02/06/2017 0828  ? ALT 12 05/29/2021 1137  ? ALT 43 02/06/2017 0828  ? BILITOT 0.6 05/29/2021 1137  ? BILITOT 0.42 02/06/2017 0828  ?  ? ? ? Latest Reference Range & Units 11/30/20 15:07 01/07/21 11:17 03/12/21 13:24 04/17/21 10:52 05/29/21 11:37  ?Kappa free light chain 3.3 - 19.4 mg/L 99.1 (H) 127.6 (H) 4.9 7.0 7.6  ?Lambda free light chains 5.7 - 26.3 mg/L 21.4 13.9 3.2 (L) 4.0 (L) 4.0 (L)  ?Kappa, lambda light chain ratio 0.26 - 1.65  4.63 (H) 9.18 (H) 1.53 1.75 (H) 1.90 (H)  ?(H): Data is abnormally high ?(L): Data is abnormally low ? ? ? ?Impression and Plan: ? ?71 year old man with: ?  ?1.  Multiple myeloma diagnosed in  2018.  He developed relapsed disease with Kappa light chain involvement and bone marrow disease in 2022. ? ? ?He is currently on Darzalex on with dexamethasone without any major complications.  Protein studies obtained on May 29, 2021 continues to show reasonable response of the therapy with the near normalization of his kappa to lambda light chain.  Complication associated with this treatment were reviewed at this time with infusion related complications were reviewed again.  He is agreeable to proceed. ? ?2.  Bone health: He has been on Zometa before which will can be restarted in the future. ? ? ?3.  Neuropathy: He continues to be on gabapentin without  complications. ? ?4.  VZV prophylaxis: No reactivation and currently on acyclovir. ? ?5.  Anemia: His hemoglobin continues to improve and this is related to plasma cell disorder.  No additional intervention is needed. ? ?6.  IV access: Peripheral veins are currently in use. ? ?7.  Antiemetics: No nausea or vomiting reported at this time.  Compazine is available to him. ? ?8.  Cardiomyopathy: No recent exacerbation at this time.  Continues to follow cardiology. ? ?9. Follow-up: In 2 weeks per daratumumab infusion protocol. ? ?30 minutes were spent on this encounter.  The time was dedicated to reviewing laboratory data, disease status update and future plan of care review. ? ? ?Zola Button, MD ?5/10/20238:18 AM ?

## 2021-06-12 NOTE — Progress Notes (Signed)
Per Dr Alen Blew, no need for CMP results today contrary to treatment parameters. ?

## 2021-06-26 ENCOUNTER — Inpatient Hospital Stay: Payer: Medicare Other

## 2021-06-26 VITALS — BP 120/64 | HR 69 | Temp 98.0°F | Resp 18 | Wt 189.5 lb

## 2021-06-26 DIAGNOSIS — D649 Anemia, unspecified: Secondary | ICD-10-CM | POA: Diagnosis not present

## 2021-06-26 DIAGNOSIS — C9002 Multiple myeloma in relapse: Secondary | ICD-10-CM | POA: Diagnosis not present

## 2021-06-26 DIAGNOSIS — C9000 Multiple myeloma not having achieved remission: Secondary | ICD-10-CM

## 2021-06-26 DIAGNOSIS — G629 Polyneuropathy, unspecified: Secondary | ICD-10-CM | POA: Diagnosis not present

## 2021-06-26 DIAGNOSIS — I429 Cardiomyopathy, unspecified: Secondary | ICD-10-CM | POA: Diagnosis not present

## 2021-06-26 DIAGNOSIS — Z5112 Encounter for antineoplastic immunotherapy: Secondary | ICD-10-CM | POA: Diagnosis not present

## 2021-06-26 LAB — CBC WITH DIFFERENTIAL (CANCER CENTER ONLY)
Abs Immature Granulocytes: 0.02 10*3/uL (ref 0.00–0.07)
Basophils Absolute: 0 10*3/uL (ref 0.0–0.1)
Basophils Relative: 1 %
Eosinophils Absolute: 0.6 10*3/uL — ABNORMAL HIGH (ref 0.0–0.5)
Eosinophils Relative: 8 %
HCT: 38 % — ABNORMAL LOW (ref 39.0–52.0)
Hemoglobin: 13.3 g/dL (ref 13.0–17.0)
Immature Granulocytes: 0 %
Lymphocytes Relative: 8 %
Lymphs Abs: 0.6 10*3/uL — ABNORMAL LOW (ref 0.7–4.0)
MCH: 33.8 pg (ref 26.0–34.0)
MCHC: 35 g/dL (ref 30.0–36.0)
MCV: 96.7 fL (ref 80.0–100.0)
Monocytes Absolute: 0.9 10*3/uL (ref 0.1–1.0)
Monocytes Relative: 11 %
Neutro Abs: 5.5 10*3/uL (ref 1.7–7.7)
Neutrophils Relative %: 72 %
Platelet Count: 241 10*3/uL (ref 150–400)
RBC: 3.93 MIL/uL — ABNORMAL LOW (ref 4.22–5.81)
RDW: 12.7 % (ref 11.5–15.5)
WBC Count: 7.6 10*3/uL (ref 4.0–10.5)
nRBC: 0 % (ref 0.0–0.2)

## 2021-06-26 LAB — CMP (CANCER CENTER ONLY)
ALT: 14 U/L (ref 0–44)
AST: 14 U/L — ABNORMAL LOW (ref 15–41)
Albumin: 4.2 g/dL (ref 3.5–5.0)
Alkaline Phosphatase: 27 U/L — ABNORMAL LOW (ref 38–126)
Anion gap: 5 (ref 5–15)
BUN: 17 mg/dL (ref 8–23)
CO2: 27 mmol/L (ref 22–32)
Calcium: 9.4 mg/dL (ref 8.9–10.3)
Chloride: 108 mmol/L (ref 98–111)
Creatinine: 0.88 mg/dL (ref 0.61–1.24)
GFR, Estimated: >60 mL/min (ref >60)
Glucose, Bld: 115 mg/dL — ABNORMAL HIGH (ref 70–99)
Potassium: 4 mmol/L (ref 3.5–5.1)
Sodium: 140 mmol/L (ref 135–145)
Total Bilirubin: 0.7 mg/dL (ref 0.3–1.2)
Total Protein: 6.5 g/dL (ref 6.5–8.1)

## 2021-06-26 MED ORDER — DIPHENHYDRAMINE HCL 25 MG PO CAPS
50.0000 mg | ORAL_CAPSULE | Freq: Once | ORAL | Status: AC
  Administered 2021-06-26: 50 mg via ORAL
  Filled 2021-06-26: qty 2

## 2021-06-26 MED ORDER — DARATUMUMAB-HYALURONIDASE-FIHJ 1800-30000 MG-UT/15ML ~~LOC~~ SOLN
1800.0000 mg | Freq: Once | SUBCUTANEOUS | Status: AC
  Administered 2021-06-26: 1800 mg via SUBCUTANEOUS
  Filled 2021-06-26: qty 15

## 2021-06-26 MED ORDER — DEXAMETHASONE 4 MG PO TABS
20.0000 mg | ORAL_TABLET | Freq: Once | ORAL | Status: AC
  Administered 2021-06-26: 20 mg via ORAL
  Filled 2021-06-26: qty 5

## 2021-06-26 MED ORDER — ACETAMINOPHEN 325 MG PO TABS
650.0000 mg | ORAL_TABLET | Freq: Once | ORAL | Status: AC
  Administered 2021-06-26: 650 mg via ORAL
  Filled 2021-06-26: qty 2

## 2021-06-26 NOTE — Patient Instructions (Signed)
[facility]  Discharge Instructions: Thank you for choosing [facility] to provide your oncology and hematology care.   If you have a lab appointment with the Cancer Center, please go directly to the Cancer Center and check in at the registration area.   Wear comfortable clothing and clothing appropriate for easy access to any Portacath or PICC line.   We strive to give you quality time with your provider. You may need to reschedule your appointment if you arrive late (15 or more minutes).  Arriving late affects you and other patients whose appointments are after yours.  Also, if you miss three or more appointments without notifying the office, you may be dismissed from the clinic at the provider's discretion.      For prescription refill requests, have your pharmacy contact our office and allow 72 hours for refills to be completed.    Today you received the following chemotherapy and/or immunotherapy agents: Darzalex Faspro      To help prevent nausea and vomiting after your treatment, we encourage you to take your nausea medication as directed.  BELOW ARE SYMPTOMS THAT SHOULD BE REPORTED IMMEDIATELY: *FEVER GREATER THAN 100.4 F (38 C) OR HIGHER *CHILLS OR SWEATING *NAUSEA AND VOMITING THAT IS NOT CONTROLLED WITH YOUR NAUSEA MEDICATION *UNUSUAL SHORTNESS OF BREATH *UNUSUAL BRUISING OR BLEEDING *URINARY PROBLEMS (pain or burning when urinating, or frequent urination) *BOWEL PROBLEMS (unusual diarrhea, constipation, pain near the anus) TENDERNESS IN MOUTH AND THROAT WITH OR WITHOUT PRESENCE OF ULCERS (sore throat, sores in mouth, or a toothache) UNUSUAL RASH, SWELLING OR PAIN  UNUSUAL VAGINAL DISCHARGE OR ITCHING   Items with * indicate a potential emergency and should be followed up as soon as possible or go to the Emergency Department if any problems should occur.  Please show the CHEMOTHERAPY ALERT CARD or IMMUNOTHERAPY ALERT CARD at  check-in to the Emergency Department and triage nurse.  Should you have questions after your visit or need to cancel or reschedule your appointment, please contact [facility]  Dept: [phone]  and follow the prompts.  Office hours are 8:00 a.m. to 4:30 p.m. Monday - Friday. Please note that voicemails left after 4:00 p.m. may not be returned until the following business day.  We are closed weekends and major holidays. You have access to a nurse at all times for urgent questions. Please call the main number to the clinic Dept: [phone] and follow the prompts.   For any non-urgent questions, you may also contact your provider using MyChart. We now offer e-Visits for anyone 18 and older to request care online for non-urgent symptoms. For details visit [email or web address].   Also download the MyChart app! Go to the app store, search "MyChart", open the app, select [facility], and log in with your MyChart username and password.  Due to Covid, a mask is required upon entering the hospital/clinic. If you do not have a mask, one will be given to you upon arrival. For doctor visits, patients may have 1 support Alexander Saunders aged 72 or older with them. For treatment visits, patients cannot have anyone with them due to current Covid guidelines and our immunocompromised population.  

## 2021-07-05 ENCOUNTER — Encounter: Payer: Self-pay | Admitting: Pharmacist

## 2021-07-05 ENCOUNTER — Ambulatory Visit (INDEPENDENT_AMBULATORY_CARE_PROVIDER_SITE_OTHER): Payer: Medicare Other | Admitting: Pharmacist

## 2021-07-05 VITALS — BP 155/78 | HR 65 | Wt 196.0 lb

## 2021-07-05 DIAGNOSIS — H25813 Combined forms of age-related cataract, bilateral: Secondary | ICD-10-CM | POA: Insufficient documentation

## 2021-07-05 DIAGNOSIS — D179 Benign lipomatous neoplasm, unspecified: Secondary | ICD-10-CM | POA: Insufficient documentation

## 2021-07-05 DIAGNOSIS — Z461 Encounter for fitting and adjustment of hearing aid: Secondary | ICD-10-CM | POA: Insufficient documentation

## 2021-07-05 DIAGNOSIS — N486 Induration penis plastica: Secondary | ICD-10-CM | POA: Insufficient documentation

## 2021-07-05 DIAGNOSIS — I5020 Unspecified systolic (congestive) heart failure: Secondary | ICD-10-CM

## 2021-07-05 DIAGNOSIS — I10 Essential (primary) hypertension: Secondary | ICD-10-CM | POA: Diagnosis not present

## 2021-07-05 DIAGNOSIS — H9313 Tinnitus, bilateral: Secondary | ICD-10-CM | POA: Insufficient documentation

## 2021-07-05 DIAGNOSIS — H903 Sensorineural hearing loss, bilateral: Secondary | ICD-10-CM | POA: Insufficient documentation

## 2021-07-05 DIAGNOSIS — M179 Osteoarthritis of knee, unspecified: Secondary | ICD-10-CM | POA: Insufficient documentation

## 2021-07-05 DIAGNOSIS — H9190 Unspecified hearing loss, unspecified ear: Secondary | ICD-10-CM | POA: Insufficient documentation

## 2021-07-05 DIAGNOSIS — G629 Polyneuropathy, unspecified: Secondary | ICD-10-CM | POA: Insufficient documentation

## 2021-07-05 DIAGNOSIS — J302 Other seasonal allergic rhinitis: Secondary | ICD-10-CM | POA: Insufficient documentation

## 2021-07-05 DIAGNOSIS — N529 Male erectile dysfunction, unspecified: Secondary | ICD-10-CM | POA: Insufficient documentation

## 2021-07-05 MED ORDER — LOSARTAN POTASSIUM 25 MG PO TABS: 25.0000 mg | ORAL_TABLET | Freq: Every day | ORAL | 1 refills | Status: DC

## 2021-07-05 NOTE — Patient Instructions (Addendum)
It was nice seeing you today  We would like your blood pressure to be less than 130/80  Please continue your carvedilol 1.5 tablets twice a day  I have sent in a refill of your losartan '25mg'$ .  Please take your blood pressure when you arrive home and through the weekend and send me a message with your results.  Then we can decide if we should increase to '50mg'$   Please watch your sodium intake  Please call with any questions  Karren Cobble, PharmD, [place], [place], [place], [place], [place], [address] Phone: [phone], Fax: [phone]

## 2021-07-05 NOTE — Progress Notes (Signed)
Patient ID: Alexander Saunders                 DOB: 05/07/1950                      MRN: [id number]     HPI: Alexander Saunders is a 71 y.o. male referred by Dr. Berry to pharmacy clinic for HF medication management. PMH is significant for HFrEF, HTN, and multiple myeloma. Most recent LVEF 25-30% on 05/13/21.  Patient presents today in good spirits. Reports no adverse effects to medication and feels well. Refuced EF has had no negative impact on his life. Continues to work out, go hiking and helps with boxing. Has not had any SOB but has noticed his resting pulse rate has decreased slightly since carvedilol increase.  Still has occasional palpitations but no chest pains.  Has not taken his BP medications yet today and has had a large coffee this morning. Also reports he had a large bag of pork rinds last night which were salty.  Home BP readings he reports have been in 110s-120s   Current CHF meds:  Losartan 25mg daily Carvedilol 9.375mg BID  BP goal: <130/80  Wt Readings from Last 3 Encounters:  06/26/21 189 lb 8 oz (86 kg)  06/12/21 190 lb 1.6 oz (86.2 kg)  06/07/21 194 lb 3.2 oz (88.1 kg)   BP Readings from Last 3 Encounters:  06/26/21 120/64  06/12/21 129/78  06/07/21 115/70   Pulse Readings from Last 3 Encounters:  06/26/21 69  06/12/21 71  06/07/21 70    Renal function: Estimated Creatinine Clearance: 84.5 mL/min (by C-G formula based on SCr of 0.88 mg/dL).  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   Stem cells transplant status (HCC) 06/2017    Current Outpatient Medications on File Prior  to Visit  Medication Sig Dispense Refill   acyclovir (ZOVIRAX) 400 MG tablet Take 1 tablet (400 mg total) by mouth daily. 90 tablet 3   aspirin EC 81 MG tablet Take 81 mg by mouth daily.     B Complex Vitamins (B-COMPLEX/B-12) TABS Take 1 tablet by mouth daily.     bisacodyl (DULCOLAX) 5 MG EC tablet Take 5 mg by mouth daily as needed for moderate constipation.     carboxymethylcellulose (REFRESH PLUS) 0.5 % SOLN Place 1 drop into both eyes 3 (three) times daily as needed (dry eyes).     carvedilol (COREG) 6.25 MG tablet Take 1.5 tablets (9.375 mg total) by mouth 2 (two) times daily. 270 tablet 3   cholecalciferol (VITAMIN D3) 25 MCG (1000 UNIT) tablet Take 1,000 Units by mouth daily.     cyclobenzaprine (FLEXERIL) 5 MG tablet Take 1 tablet (5 mg total) by mouth at bedtime as needed for muscle spasms. 30 tablet 1   diphenhydramine-acetaminophen (TYLENOL PM EXTRA STRENGTH) 25-500 MG TABS tablet Take 1 tablet by mouth at bedtime as needed (pain).     gabapentin (NEURONTIN) 300 MG capsule TAKE 3 CAPSULE BY MOUTH THREE TIMES DAILY. 3 CAPSULE IN THE   MORNING 3 CAPSULE IN THE AFTERNOON AND 3 CAPSULE AT NIGHT 270 capsule 3   losartan (COZAAR) 25 MG tablet Take 25 mg by mouth daily.     Magnesium 100 MG TABS Take 100 mg by mouth daily.     Multiple Vitamin (MULTIVITAMIN) capsule Take 1 capsule by mouth daily.     sodium chloride (OCEAN) 0.65 % SOLN nasal spray Place 1 spray into both nostrils daily as needed for congestion.     traMADol (ULTRAM) 50 MG tablet Take 1 tablet (50 mg total) by mouth every 6 (six) hours as needed. 30 tablet 1   No current facility-administered medications on file prior to visit.    No Known Allergies   Assessment/Plan:  1. CHF -  Patient BP in room 155/78 which is above goal of <130/80.  Rechecked later in appointment and received similar values. Higher than home readings and patient is surprised. However he has not taken BP medications yet today and has had coffee and  consumed more sodium than recently.  Advised that if home BP readings consistently stay above goal will need to increase losartan to 60m.  Patient will send readings over via myChart. Advised to reduce salt intake.  Continue  Carvedilol 9.3767mBid Losartan 2532maily Recheck in 4 weeks  ChrKarren CobbleharmD, BCAMattawanDCPlymouthPPMcKinleyvilleui[place]villee[place] CreekC,[id number]: [phone]: [phone]

## 2021-07-09 ENCOUNTER — Ambulatory Visit: Payer: TRICARE For Life (TFL)

## 2021-07-10 ENCOUNTER — Inpatient Hospital Stay: Payer: Medicare Other

## 2021-07-10 ENCOUNTER — Other Ambulatory Visit: Payer: Self-pay

## 2021-07-10 ENCOUNTER — Inpatient Hospital Stay: Payer: Medicare Other | Attending: Oncology

## 2021-07-10 VITALS — BP 139/72 | HR 59 | Temp 98.0°F | Resp 18 | Wt 199.2 lb

## 2021-07-10 DIAGNOSIS — C9002 Multiple myeloma in relapse: Secondary | ICD-10-CM | POA: Insufficient documentation

## 2021-07-10 DIAGNOSIS — G629 Polyneuropathy, unspecified: Secondary | ICD-10-CM | POA: Insufficient documentation

## 2021-07-10 DIAGNOSIS — Z5112 Encounter for antineoplastic immunotherapy: Secondary | ICD-10-CM | POA: Insufficient documentation

## 2021-07-10 DIAGNOSIS — I429 Cardiomyopathy, unspecified: Secondary | ICD-10-CM | POA: Insufficient documentation

## 2021-07-10 DIAGNOSIS — Z79899 Other long term (current) drug therapy: Secondary | ICD-10-CM | POA: Diagnosis not present

## 2021-07-10 DIAGNOSIS — C9000 Multiple myeloma not having achieved remission: Secondary | ICD-10-CM

## 2021-07-10 LAB — CMP (CANCER CENTER ONLY)
ALT: 11 U/L (ref 0–44)
AST: 13 U/L — ABNORMAL LOW (ref 15–41)
Albumin: 4 g/dL (ref 3.5–5.0)
Alkaline Phosphatase: 24 U/L — ABNORMAL LOW (ref 38–126)
Anion gap: 4 — ABNORMAL LOW (ref 5–15)
BUN: 15 mg/dL (ref 8–23)
CO2: 28 mmol/L (ref 22–32)
Calcium: 9.7 mg/dL (ref 8.9–10.3)
Chloride: 106 mmol/L (ref 98–111)
Creatinine: 0.81 mg/dL (ref 0.61–1.24)
GFR, Estimated: >60 mL/min (ref >60)
Glucose, Bld: 105 mg/dL — ABNORMAL HIGH (ref 70–99)
Potassium: 4.1 mmol/L (ref 3.5–5.1)
Sodium: 138 mmol/L (ref 135–145)
Total Bilirubin: 0.5 mg/dL (ref 0.3–1.2)
Total Protein: 6 g/dL — ABNORMAL LOW (ref 6.5–8.1)

## 2021-07-10 LAB — CBC WITH DIFFERENTIAL (CANCER CENTER ONLY)
Abs Immature Granulocytes: 0.03 10*3/uL (ref 0.00–0.07)
Basophils Absolute: 0 10*3/uL (ref 0.0–0.1)
Basophils Relative: 0 %
Eosinophils Absolute: 0.6 10*3/uL — ABNORMAL HIGH (ref 0.0–0.5)
Eosinophils Relative: 7 %
HCT: 36.1 % — ABNORMAL LOW (ref 39.0–52.0)
Hemoglobin: 12.7 g/dL — ABNORMAL LOW (ref 13.0–17.0)
Immature Granulocytes: 0 %
Lymphocytes Relative: 9 %
Lymphs Abs: 0.7 10*3/uL (ref 0.7–4.0)
MCH: 33.9 pg (ref 26.0–34.0)
MCHC: 35.2 g/dL (ref 30.0–36.0)
MCV: 96.3 fL (ref 80.0–100.0)
Monocytes Absolute: 0.8 10*3/uL (ref 0.1–1.0)
Monocytes Relative: 11 %
Neutro Abs: 5.5 10*3/uL (ref 1.7–7.7)
Neutrophils Relative %: 73 %
Platelet Count: 217 10*3/uL (ref 150–400)
RBC: 3.75 MIL/uL — ABNORMAL LOW (ref 4.22–5.81)
RDW: 12.8 % (ref 11.5–15.5)
WBC Count: 7.7 10*3/uL (ref 4.0–10.5)
nRBC: 0 % (ref 0.0–0.2)

## 2021-07-10 MED ORDER — DARATUMUMAB-HYALURONIDASE-FIHJ 1800-30000 MG-UT/15ML ~~LOC~~ SOLN
1800.0000 mg | Freq: Once | SUBCUTANEOUS | Status: AC
  Administered 2021-07-10: 1800 mg via SUBCUTANEOUS
  Filled 2021-07-10: qty 15

## 2021-07-10 MED ORDER — ACETAMINOPHEN 325 MG PO TABS
650.0000 mg | ORAL_TABLET | Freq: Once | ORAL | Status: AC
  Administered 2021-07-10: 650 mg via ORAL
  Filled 2021-07-10: qty 2

## 2021-07-10 MED ORDER — DIPHENHYDRAMINE HCL 25 MG PO CAPS
50.0000 mg | ORAL_CAPSULE | Freq: Once | ORAL | Status: AC
  Administered 2021-07-10: 50 mg via ORAL
  Filled 2021-07-10: qty 2

## 2021-07-10 MED ORDER — DEXAMETHASONE 4 MG PO TABS
20.0000 mg | ORAL_TABLET | Freq: Once | ORAL | Status: AC
  Administered 2021-07-10: 20 mg via ORAL
  Filled 2021-07-10: qty 5

## 2021-07-11 LAB — KAPPA/LAMBDA LIGHT CHAINS
Kappa free light chain: 8.3 mg/L (ref 3.3–19.4)
Kappa, lambda light chain ratio: 1.93 — ABNORMAL HIGH (ref 0.26–1.65)
Lambda free light chains: 4.3 mg/L — ABNORMAL LOW (ref 5.7–26.3)

## 2021-07-15 LAB — MULTIPLE MYELOMA PANEL, SERUM
Albumin SerPl Elph-Mcnc: 3.5 g/dL (ref 2.9–4.4)
Albumin/Glob SerPl: 1.7 (ref 0.7–1.7)
Alpha 1: 0.2 g/dL (ref 0.0–0.4)
Alpha2 Glob SerPl Elph-Mcnc: 0.6 g/dL (ref 0.4–1.0)
B-Globulin SerPl Elph-Mcnc: 0.7 g/dL (ref 0.7–1.3)
Gamma Glob SerPl Elph-Mcnc: 0.6 g/dL (ref 0.4–1.8)
Globulin, Total: 2.1 g/dL — ABNORMAL LOW (ref 2.2–3.9)
IgA: 32 mg/dL — ABNORMAL LOW (ref 61–437)
IgG (Immunoglobin G), Serum: 591 mg/dL — ABNORMAL LOW (ref 603–1613)
IgM (Immunoglobulin M), Srm: 7 mg/dL — ABNORMAL LOW (ref 15–143)
M Protein SerPl Elph-Mcnc: 0.2 g/dL — ABNORMAL HIGH
Total Protein ELP: 5.6 g/dL — ABNORMAL LOW (ref 6.0–8.5)

## 2021-07-24 ENCOUNTER — Other Ambulatory Visit: Payer: Self-pay

## 2021-07-24 ENCOUNTER — Inpatient Hospital Stay: Payer: Medicare Other

## 2021-07-24 ENCOUNTER — Inpatient Hospital Stay (HOSPITAL_BASED_OUTPATIENT_CLINIC_OR_DEPARTMENT_OTHER): Payer: Medicare Other | Admitting: Oncology

## 2021-07-24 ENCOUNTER — Telehealth: Payer: Self-pay | Admitting: *Deleted

## 2021-07-24 VITALS — BP 137/79 | HR 62 | Temp 98.1°F | Resp 16 | Ht 72.0 in | Wt 197.8 lb

## 2021-07-24 DIAGNOSIS — C9000 Multiple myeloma not having achieved remission: Secondary | ICD-10-CM | POA: Diagnosis not present

## 2021-07-24 DIAGNOSIS — I429 Cardiomyopathy, unspecified: Secondary | ICD-10-CM | POA: Diagnosis not present

## 2021-07-24 DIAGNOSIS — C9002 Multiple myeloma in relapse: Secondary | ICD-10-CM | POA: Diagnosis not present

## 2021-07-24 DIAGNOSIS — Z79899 Other long term (current) drug therapy: Secondary | ICD-10-CM | POA: Diagnosis not present

## 2021-07-24 DIAGNOSIS — G629 Polyneuropathy, unspecified: Secondary | ICD-10-CM | POA: Diagnosis not present

## 2021-07-24 DIAGNOSIS — Z5112 Encounter for antineoplastic immunotherapy: Secondary | ICD-10-CM | POA: Diagnosis not present

## 2021-07-24 LAB — CMP (CANCER CENTER ONLY)
ALT: 12 U/L (ref 0–44)
AST: 13 U/L — ABNORMAL LOW (ref 15–41)
Albumin: 4.2 g/dL (ref 3.5–5.0)
Alkaline Phosphatase: 26 U/L — ABNORMAL LOW (ref 38–126)
Anion gap: 6 (ref 5–15)
BUN: 22 mg/dL (ref 8–23)
CO2: 28 mmol/L (ref 22–32)
Calcium: 9.9 mg/dL (ref 8.9–10.3)
Chloride: 106 mmol/L (ref 98–111)
Creatinine: 0.95 mg/dL (ref 0.61–1.24)
GFR, Estimated: >60 mL/min (ref >60)
Glucose, Bld: 109 mg/dL — ABNORMAL HIGH (ref 70–99)
Potassium: 4 mmol/L (ref 3.5–5.1)
Sodium: 140 mmol/L (ref 135–145)
Total Bilirubin: 0.4 mg/dL (ref 0.3–1.2)
Total Protein: 6.4 g/dL — ABNORMAL LOW (ref 6.5–8.1)

## 2021-07-24 LAB — CBC WITH DIFFERENTIAL (CANCER CENTER ONLY)
Abs Immature Granulocytes: 0.02 10*3/uL (ref 0.00–0.07)
Basophils Absolute: 0 10*3/uL (ref 0.0–0.1)
Basophils Relative: 0 %
Eosinophils Absolute: 0.4 10*3/uL (ref 0.0–0.5)
Eosinophils Relative: 6 %
HCT: 36.6 % — ABNORMAL LOW (ref 39.0–52.0)
Hemoglobin: 12.9 g/dL — ABNORMAL LOW (ref 13.0–17.0)
Immature Granulocytes: 0 %
Lymphocytes Relative: 11 %
Lymphs Abs: 0.8 10*3/uL (ref 0.7–4.0)
MCH: 33.6 pg (ref 26.0–34.0)
MCHC: 35.2 g/dL (ref 30.0–36.0)
MCV: 95.3 fL (ref 80.0–100.0)
Monocytes Absolute: 0.9 10*3/uL (ref 0.1–1.0)
Monocytes Relative: 13 %
Neutro Abs: 5.1 10*3/uL (ref 1.7–7.7)
Neutrophils Relative %: 70 %
Platelet Count: 244 10*3/uL (ref 150–400)
RBC: 3.84 MIL/uL — ABNORMAL LOW (ref 4.22–5.81)
RDW: 13.1 % (ref 11.5–15.5)
WBC Count: 7.2 10*3/uL (ref 4.0–10.5)
nRBC: 0 % (ref 0.0–0.2)

## 2021-07-24 MED ORDER — DARATUMUMAB-HYALURONIDASE-FIHJ 1800-30000 MG-UT/15ML ~~LOC~~ SOLN
1800.0000 mg | Freq: Once | SUBCUTANEOUS | Status: AC
  Administered 2021-07-24: 1800 mg via SUBCUTANEOUS
  Filled 2021-07-24: qty 15

## 2021-07-24 MED ORDER — DEXAMETHASONE 4 MG PO TABS
20.0000 mg | ORAL_TABLET | Freq: Once | ORAL | Status: AC
  Administered 2021-07-24: 20 mg via ORAL
  Filled 2021-07-24: qty 5

## 2021-07-24 MED ORDER — DIPHENHYDRAMINE HCL 25 MG PO CAPS
50.0000 mg | ORAL_CAPSULE | Freq: Once | ORAL | Status: AC
  Administered 2021-07-24: 50 mg via ORAL
  Filled 2021-07-24: qty 2

## 2021-07-24 MED ORDER — ACETAMINOPHEN 325 MG PO TABS
650.0000 mg | ORAL_TABLET | Freq: Once | ORAL | Status: AC
  Administered 2021-07-24: 650 mg via ORAL
  Filled 2021-07-24: qty 2

## 2021-07-24 NOTE — Progress Notes (Signed)
Hematology and Oncology Follow Up Visit  Alexander Saunders [id number] 12-15-1950 71 y.o. 07/24/2021 8:08 AM [name], [name] MDOsei-Bonsu, [name], MD   Principle Diagnosis: 71 year old man with kappa free light chain multiple myeloma diagnosed in 2018.  He presented with plasmacytoma and subsequently developed relapse in 2023.    Prior Therapy:  He is status post surgical decompression of an epidural mass on 09/04/2016 and the pathology showed a plasma cell neoplasm.  He is S/P adjuvant radiation therapy to the thoracic spine to be completed on 10/14/2016.  Velcade 1.5 mg/m weekly with dexamethasone 20 mg and Cytoxan 600 mg po. Therapy started on 10/24/2016.   High-dose chemotherapy followed by stem cell transplant: Melphalan 200 mg/m2 completed on 06/17/17 followed by autologous stem cell rescue 06/18/17.  He achieved complete response.   Zometa 4 mg every 3 months started in February 2020.  Last treatment given in January 2022.   Revlimid 10 mg daily for 21 days out of a 28-day started in February 2020.  Therapy discontinued in November 2022 because of worsening anemia.  Carfilzomib 20 mg/m weekly started on February 19, 2021.  Therapy stopped in March 2023 for presumed cardiomyopathy complication.   Daratumumab 1800 mg subcutaneous injection weekly started on February 19, 2021. Dexamethasone weekly started on February 19, 2021.  Current therapy:    Daratumumab 1800 mg subcutaneous injections per protocol currently every 14 days with Dexamethasone weekly 20 mg.    Interim History:  Mr. Mesta returns today for repeat evaluation.  Since last visit, he reports no major complications or complaints.  He has tolerated maintenance daratumumab without any concerns.  He denies any nausea, vomiting or abdominal pain.  He denies any bone pain or worsening neuropathy.  His performance status quality of life remained excellent.  He is desiring to go back to work at this  time.     Medications: Updated on review. Current Outpatient Medications  Medication Sig Dispense Refill   acyclovir (ZOVIRAX) 400 MG tablet Take 1 tablet (400 mg total) by mouth daily. 90 tablet 3   aspirin EC 81 MG tablet Take 81 mg by mouth daily.     B Complex Vitamins (B-COMPLEX/B-12) TABS Take 1 tablet by mouth daily.     bisacodyl (DULCOLAX) 5 MG EC tablet Take 5 mg by mouth daily as needed for moderate constipation.     carboxymethylcellulose (REFRESH PLUS) 0.5 % SOLN Place 1 drop into both eyes 3 (three) times daily as needed (dry eyes).     carvedilol (COREG) 6.25 MG tablet Take 1.5 tablets (9.375 mg total) by mouth 2 (two) times daily. 270 tablet 3   cholecalciferol (VITAMIN D3) 25 MCG (1000 UNIT) tablet Take 1,000 Units by mouth daily.     cyclobenzaprine (FLEXERIL) 5 MG tablet Take 1 tablet (5 mg total) by mouth at bedtime as needed for muscle spasms. 30 tablet 1   diphenhydramine-acetaminophen (TYLENOL PM EXTRA STRENGTH) 25-500 MG TABS tablet Take 1 tablet by mouth at bedtime as needed (pain).     gabapentin (NEURONTIN) 300 MG capsule TAKE 3 CAPSULE BY MOUTH THREE TIMES DAILY. 3 CAPSULE IN THE MORNING 3 CAPSULE IN THE AFTERNOON AND 3 CAPSULE AT NIGHT 270 capsule 3   losartan (COZAAR) 25 MG tablet Take 1 tablet (25 mg total) by mouth daily. 90 tablet 1   Magnesium 100 MG TABS Take 100 mg by mouth daily.     Multiple Vitamin (MULTIVITAMIN) capsule Take 1 capsule by mouth daily.     sodium chloride (OCEAN)  0.65 % SOLN nasal spray Place 1 spray into both nostrils daily as needed for congestion.     traMADol (ULTRAM) 50 MG tablet Take 1 tablet (50 mg total) by mouth every 6 (six) hours as needed. 30 tablet 1   No current facility-administered medications for this visit.     Allergies: No Known Allergies    Physical Exam:         Blood pressure 137/79, pulse 62, temperature 98.1 F (36.7 C), temperature source Temporal, resp. rate 16, height 6' (1.829 m), weight 197  lb 12.8 oz (89.7 kg), SpO2 100 %.         ECOG: 1   General appearance: Comfortable appearing without any discomfort Head: Normocephalic without any trauma Oropharynx: Mucous membranes are moist and pink without any thrush or ulcers. Eyes: Pupils are equal and round reactive to light. Lymph nodes: No cervical, supraclavicular, inguinal or axillary lymphadenopathy.   Heart:regular rate and rhythm.  S1 and S2 without leg edema. Lung: Clear without any rhonchi or wheezes.  No dullness to percussion. Abdomin: Soft, nontender, nondistended with good bowel sounds.  No hepatosplenomegaly. Musculoskeletal: No joint deformity or effusion.  Full range of motion noted. Neurological: No deficits noted on motor, sensory and deep tendon reflex exam. Skin: No petechial rash or dryness.  Appeared moist.              Lab Results: Lab Results  Component Value Date   WBC 7.2 07/24/2021   HGB 12.9 (L) 07/24/2021   HCT 36.6 (L) 07/24/2021   MCV 95.3 07/24/2021   PLT 244 07/24/2021     Chemistry      Component Value Date/Time   NA 138 07/10/2021 1128   NA 137 02/06/2017 0828   K 4.1 07/10/2021 1128   K 4.0 02/06/2017 0828   CL 106 07/10/2021 1128   CO2 28 07/10/2021 1128   CO2 24 02/06/2017 0828   BUN 15 07/10/2021 1128   BUN 14.9 02/06/2017 0828   CREATININE 0.81 07/10/2021 1128   CREATININE 0.9 02/06/2017 0828      Component Value Date/Time   CALCIUM 9.7 07/10/2021 1128   CALCIUM 9.1 02/06/2017 0828   ALKPHOS 24 (L) 07/10/2021 1128   ALKPHOS 31 (L) 02/06/2017 0828   AST 13 (L) 07/10/2021 1128   AST 32 02/06/2017 0828   ALT 11 07/10/2021 1128   ALT 43 02/06/2017 0828   BILITOT 0.5 07/10/2021 1128   BILITOT 0.42 02/06/2017 0828       Latest Reference Range & Units 04/17/21 10:52 05/29/21 11:38 07/10/21 11:29  M Protein SerPl Elph-Mcnc Not Observed g/dL 0.2 (H) (C) 0.1 (H) (C) 0.2 (H) (C)  IFE 1  Comment ! (C) Comment ! (C) Comment ! (C)  Globulin, Total 2.2 - 3.9  g/dL 2.4 (C) 2.1 (L) (C) 2.1 (L) (C)  B-Globulin SerPl Elph-Mcnc 0.7 - 1.3 g/dL 0.9 (C) 0.7 (C) 0.7 (C)  IgG (Immunoglobin G), Serum 603 - 1,613 mg/dL [phone] (L)  IgM (Immunoglobulin M), Srm 15 - 143 mg/dL 6 (L) 5 (L) 7 (L)  IgA 61 - 437 mg/dL 22 (L) 32 (L) 32 (L)  (H): Data is abnormally high !: Data is abnormal (L): Data is abnormally low (C): Corrected   Latest Reference Range & Units 03/12/21 13:24 04/17/21 10:52 05/29/21 11:37 07/10/21 11:28  Kappa free light chain 3.3 - 19.4 mg/L 4.9 7.0 7.6 8.3  Lambda free light chains 5.7 - 26.3 mg/L 3.2 (L) 4.0 (L) 4.0 (  L) 4.3 (L)  Kappa, lambda light chain ratio 0.26 - 1.65  1.53 1.75 (H) 1.90 (H) 1.93 (H)  (L): Data is abnormally low (H): Data is abnormally high  Impression and Plan:  71 year old man with:   1.  Kappa light chain multiple myeloma diagnosed in 2018.  He developed relapsed disease with bone marrow involvement in 2022.  He received reinduction with triple therapy and currently on maintenance daratumumab.  Protein studies on July 10, 2021 shows very little residual M spike with normalization of his kappa free light chain.  Risks and benefits of continuing daratumumab maintenance versus restarting Kyprolis were discussed.  Kyprolis has been withheld today due to cardiac concerns.  After discussion today, I recommended continuing monthly daratumumab maintenance.  Given his history of transplant relapsed disease, I recommended referral to [facility] for repeat evaluation regarding the recommendation for current and future treatment.  2.  Bone health: No evidence of metastatic disease to the bone noted.  He has received Zometa for a total of 2 years.   3.  Neuropathy: He is currently on gabapentin without any exacerbation.  4.  VZV prophylaxis: I recommended continuing acyclovir for the time being.  No reactivation noted.  5.  Anemia: Resolved at this time with hemoglobin close to normal range.  This is related to his  plasma cell disorder.  6.  IV access: Peripheral veins are currently in use without any issues.  7.  Antiemetics: Compazine is available to him without any nausea or vomiting.  8.  Cardiomyopathy: No clinical signs or symptoms of heart failure.  He continues to follow with cardiology.  9. Follow-up: We will continue to follow every 2 weeks for daratumumab maintenance.  30 minutes were spent on this encounter.  The time was dedicated to reviewing laboratory data, disease status update and future plan of care review.   Zola Button, MD 6/21/20238:08 AM

## 2021-07-24 NOTE — Patient Instructions (Signed)
[facility] MEDICAL [facility]COLOG[facility]   Discharge Instructions: Thank you for choosing [facility] to provide your oncology and hematology care.   If you have a lab appointment with the Cancer Center, please go directly to the Cancer Center and check in at the registration area.   Wear comfortable clothing and clothing appropriate for easy access to any Portacath or PICC line.   We strive to give you quality time with your provider. You may need to reschedule your appointment if you arrive late (15 or more minutes).  Arriving late affects you and other patients whose appointments are after yours.  Also, if you miss three or more appointments without notifying the office, you may be dismissed from the clinic at the provider's discretion.      For prescription refill requests, have your pharmacy contact our office and allow 72 hours for refills to be completed.    Today you received the following chemotherapy and/or immunotherapy agents: daratumumab-hyaluronidase-fihj      To help prevent nausea and vomiting after your treatment, we encourage you to take your nausea medication as directed.  BELOW ARE SYMPTOMS THAT SHOULD BE REPORTED IMMEDIATELY: *FEVER GREATER THAN 100.4 F (38 C) OR HIGHER *CHILLS OR SWEATING *NAUSEA AND VOMITING THAT IS NOT CONTROLLED WITH YOUR NAUSEA MEDICATION *UNUSUAL SHORTNESS OF BREATH *UNUSUAL BRUISING OR BLEEDING *URINARY PROBLEMS (pain or burning when urinating, or frequent urination) *BOWEL PROBLEMS (unusual diarrhea, constipation, pain near the anus) TENDERNESS IN MOUTH AND THROAT WITH OR WITHOUT PRESENCE OF ULCERS (sore throat, sores in mouth, or a toothache) UNUSUAL RASH, SWELLING OR PAIN  UNUSUAL VAGINAL DISCHARGE OR ITCHING   Items with * indicate a potential emergency and should be followed up as soon as possible or go to the Emergency Department if any problems should occur.  Please show the CHEMOTHERAPY ALERT CARD or IMMUNOTHERAPY  ALERT CARD at check-in to the Emergency Department and triage nurse.  Should you have questions after your visit or need to cancel or reschedule your appointment, please contact [facility]COL[facility]  Dept: [phone]  and follow the prompts.  Office hours are 8:00 a.m. to 4:30 p.m. Monday - Friday. Please note that voicemails left after 4:00 p.m. may not be returned until the following business day.  We are closed weekends and major holidays. You have access to a nurse at all times for urgent questions. Please call the main number to the clinic Dept: [phone] and follow the prompts.   For any non-urgent questions, you may also contact your provider using MyChart. We now offer e-Visits for anyone 18 and older to request care online for non-urgent symptoms. For details visit [email or web address].   Also download the MyChart app! Go to the app store, search "MyChart", open the app, select [facility], and log in with your MyChart username and password.  Masks are optional in the cancer centers. If you would like for your care team to wear a mask while they are taking care of you, please let them know. For doctor visits, patients may have with them one support person who is at least 72 years old. At this time, visitors are not allowed in the infusion area. 

## 2021-07-24 NOTE — Telephone Encounter (Signed)
He needs a referral to [facility] Multiple Myeloma group. Dr. Montel Clock (or others). Phone [phone]. He has relapsed myeloma after transplant.    Referral called

## 2021-07-31 ENCOUNTER — Telehealth: Payer: Self-pay | Admitting: Oncology

## 2021-07-31 NOTE — Telephone Encounter (Signed)
Scheduled per 06/22 los, patient has been called and voicemail was left.

## 2021-08-07 ENCOUNTER — Inpatient Hospital Stay: Payer: Medicare Other | Attending: Oncology

## 2021-08-07 ENCOUNTER — Inpatient Hospital Stay: Payer: Medicare Other

## 2021-08-07 ENCOUNTER — Other Ambulatory Visit: Payer: Self-pay

## 2021-08-07 VITALS — BP 113/66 | HR 82 | Temp 98.1°F | Resp 17 | Wt 198.5 lb

## 2021-08-07 DIAGNOSIS — I429 Cardiomyopathy, unspecified: Secondary | ICD-10-CM | POA: Diagnosis not present

## 2021-08-07 DIAGNOSIS — G629 Polyneuropathy, unspecified: Secondary | ICD-10-CM | POA: Insufficient documentation

## 2021-08-07 DIAGNOSIS — C9002 Multiple myeloma in relapse: Secondary | ICD-10-CM | POA: Diagnosis not present

## 2021-08-07 DIAGNOSIS — D649 Anemia, unspecified: Secondary | ICD-10-CM | POA: Diagnosis not present

## 2021-08-07 DIAGNOSIS — Z5112 Encounter for antineoplastic immunotherapy: Secondary | ICD-10-CM | POA: Diagnosis not present

## 2021-08-07 DIAGNOSIS — C9000 Multiple myeloma not having achieved remission: Secondary | ICD-10-CM

## 2021-08-07 LAB — CBC WITH DIFFERENTIAL (CANCER CENTER ONLY)
Abs Immature Granulocytes: 0.03 10*3/uL (ref 0.00–0.07)
Basophils Absolute: 0 10*3/uL (ref 0.0–0.1)
Basophils Relative: 0 %
Eosinophils Absolute: 0.2 10*3/uL (ref 0.0–0.5)
Eosinophils Relative: 2 %
HCT: 36.2 % — ABNORMAL LOW (ref 39.0–52.0)
Hemoglobin: 12.9 g/dL — ABNORMAL LOW (ref 13.0–17.0)
Immature Granulocytes: 0 %
Lymphocytes Relative: 5 %
Lymphs Abs: 0.5 10*3/uL — ABNORMAL LOW (ref 0.7–4.0)
MCH: 33.4 pg (ref 26.0–34.0)
MCHC: 35.6 g/dL (ref 30.0–36.0)
MCV: 93.8 fL (ref 80.0–100.0)
Monocytes Absolute: 0.8 10*3/uL (ref 0.1–1.0)
Monocytes Relative: 7 %
Neutro Abs: 9.6 10*3/uL — ABNORMAL HIGH (ref 1.7–7.7)
Neutrophils Relative %: 86 %
Platelet Count: 241 10*3/uL (ref 150–400)
RBC: 3.86 MIL/uL — ABNORMAL LOW (ref 4.22–5.81)
RDW: 13.2 % (ref 11.5–15.5)
WBC Count: 11.1 10*3/uL — ABNORMAL HIGH (ref 4.0–10.5)
nRBC: 0 % (ref 0.0–0.2)

## 2021-08-07 LAB — CMP (CANCER CENTER ONLY)
ALT: 14 U/L (ref 0–44)
AST: 15 U/L (ref 15–41)
Albumin: 4.4 g/dL (ref 3.5–5.0)
Alkaline Phosphatase: 28 U/L — ABNORMAL LOW (ref 38–126)
Anion gap: 7 (ref 5–15)
BUN: 21 mg/dL (ref 8–23)
CO2: 26 mmol/L (ref 22–32)
Calcium: 10.1 mg/dL (ref 8.9–10.3)
Chloride: 106 mmol/L (ref 98–111)
Creatinine: 0.91 mg/dL (ref 0.61–1.24)
GFR, Estimated: >60 mL/min (ref >60)
Glucose, Bld: 110 mg/dL — ABNORMAL HIGH (ref 70–99)
Potassium: 4.1 mmol/L (ref 3.5–5.1)
Sodium: 139 mmol/L (ref 135–145)
Total Bilirubin: 0.5 mg/dL (ref 0.3–1.2)
Total Protein: 6.7 g/dL (ref 6.5–8.1)

## 2021-08-07 MED ORDER — ACETAMINOPHEN 325 MG PO TABS
650.0000 mg | ORAL_TABLET | Freq: Once | ORAL | Status: AC
  Administered 2021-08-07: 650 mg via ORAL
  Filled 2021-08-07: qty 2

## 2021-08-07 MED ORDER — DARATUMUMAB-HYALURONIDASE-FIHJ 1800-30000 MG-UT/15ML ~~LOC~~ SOLN
1800.0000 mg | Freq: Once | SUBCUTANEOUS | Status: AC
  Administered 2021-08-07: 1800 mg via SUBCUTANEOUS
  Filled 2021-08-07: qty 15

## 2021-08-07 MED ORDER — DEXAMETHASONE 4 MG PO TABS
20.0000 mg | ORAL_TABLET | Freq: Once | ORAL | Status: AC
  Administered 2021-08-07: 20 mg via ORAL
  Filled 2021-08-07: qty 5

## 2021-08-07 MED ORDER — DIPHENHYDRAMINE HCL 25 MG PO CAPS
50.0000 mg | ORAL_CAPSULE | Freq: Once | ORAL | Status: AC
  Administered 2021-08-07: 50 mg via ORAL
  Filled 2021-08-07: qty 2

## 2021-08-07 NOTE — Patient Instructions (Addendum)
[facility] ONCOLOGY  Discharge Instructions: Thank you for choosing [facility] to provide your oncology and hematology care.   If you have a lab appointment with the [facility], please go directly to the [facility] and check in at the registration area.   Wear comfortable clothing and clothing appropriate for easy access to any Portacath or PICC line.   We strive to give you quality time with your provider. You may need to reschedule your appointment if you arrive late (15 or more minutes).  Arriving late affects you and other patients whose appointments are after yours.  Also, if you miss three or more appointments without notifying the office, you may be dismissed from the clinic at the provider's discretion.      For prescription refill requests, have your pharmacy contact our office and allow 72 hours for refills to be completed.    Today you received the following chemotherapy and/or immunotherapy agents:Daratumamab with Hyaluronidase   To help prevent nausea and vomiting after your treatment, we encourage you to take your nausea medication as directed.  BELOW ARE SYMPTOMS THAT SHOULD BE REPORTED IMMEDIATELY: *FEVER GREATER THAN 100.4 F (38 C) OR HIGHER *CHILLS OR SWEATING *NAUSEA AND VOMITING THAT IS NOT CONTROLLED WITH YOUR NAUSEA MEDICATION *UNUSUAL SHORTNESS OF BREATH *UNUSUAL BRUISING OR BLEEDING *URINARY PROBLEMS (pain or burning when urinating, or frequent urination) *BOWEL PROBLEMS (unusual diarrhea, constipation, pain near the anus) TENDERNESS IN MOUTH AND THROAT WITH OR WITHOUT PRESENCE OF ULCERS (sore throat, sores in mouth, or a toothache) UNUSUAL RASH, SWELLING OR PAIN  UNUSUAL VAGINAL DISCHARGE OR ITCHING   Items with * indicate a potential emergency and should be followed up as soon as possible or go to the Emergency Department if any problems should occur.  Please show the CHEMOTHERAPY ALERT CARD or IMMUNOTHERAPY ALERT CARD  at check-in to the Emergency Department and triage nurse.  Should you have questions after your visit or need to cancel or reschedule your appointment, please contact [facility]  Dept: [phone]  and follow the prompts.  Office hours are 8:00 a.m. to 4:30 p.m. Monday - Friday. Please note that voicemails left after 4:00 p.m. may not be returned until the following business day.  We are closed weekends and major holidays. You have access to a nurse at all times for urgent questions. Please call the main number to the clinic Dept: [phone] and follow the prompts.   For any non-urgent questions, you may also contact your provider using MyChart. We now offer e-Visits for anyone 54 and older to request care online for non-urgent symptoms. For details visit [email or web address].   Also download the MyChart app! Go to the app store, search "MyChart", open the app, select [facility], and log in with your MyChart username and password.  Masks are optional in the cancer centers. If you would like for your care team to wear a mask while they are taking care of you, please let them know. For doctor visits, patients may have with them one support person who is at least 72 years old. At this time, visitors are not allowed in the infusion area.

## 2021-08-08 ENCOUNTER — Telehealth: Payer: Self-pay | Admitting: Pharmacist

## 2021-08-08 ENCOUNTER — Encounter: Payer: Self-pay | Admitting: Pharmacist

## 2021-08-08 ENCOUNTER — Ambulatory Visit (INDEPENDENT_AMBULATORY_CARE_PROVIDER_SITE_OTHER): Payer: Medicare Other | Admitting: Pharmacist

## 2021-08-08 VITALS — BP 162/80 | HR 70 | Wt 202.8 lb

## 2021-08-08 DIAGNOSIS — I5020 Unspecified systolic (congestive) heart failure: Secondary | ICD-10-CM | POA: Diagnosis not present

## 2021-08-08 DIAGNOSIS — I10 Essential (primary) hypertension: Secondary | ICD-10-CM

## 2021-08-08 LAB — KAPPA/LAMBDA LIGHT CHAINS
Kappa free light chain: 7.1 mg/L (ref 3.3–19.4)
Kappa, lambda light chain ratio: 1.87 — ABNORMAL HIGH (ref 0.26–1.65)
Lambda free light chains: 3.8 mg/L — ABNORMAL LOW (ref 5.7–26.3)

## 2021-08-08 MED ORDER — EMPAGLIFLOZIN 10 MG PO TABS: ORAL_TABLET | ORAL | 3 refills | Status: DC

## 2021-08-08 NOTE — Telephone Encounter (Signed)
Rx for Jardiance sent to pharmacy

## 2021-08-08 NOTE — Patient Instructions (Addendum)
It was good seeing you again  We would like your blood pressure to be less than 130/80  We would like to increase your losartan to '50mg'$  once a day.  You can take two of your '25mg'$  tablets once a day until you run out  Continue your carvedilol 9.'375mg'$  twice a day  I am thinking about starting a new medication called either [place] or Jardiance.  I will reach out to your oncologist regarding it  Please send over your blood pressure readings to me   Karren Cobble, PharmD, [place], [place], [address] [place], [place], [place], [address] Phone: [phone], Fax: [phone]

## 2021-08-08 NOTE — Progress Notes (Signed)
Patient ID: EMRIK ERHARD                 DOB: 1950/07/14                      MRN: [id number]     HPI: Alexander Saunders is a 71 y.o. male referred by Dr. [name] to pharmacy clinic for HF medication management. PMH is significant for HFrEF, HTN, and multiple myeloma. Most recent LVEF 25-30% on 05/13/21.  Patient presents today in good spirits. Reports no adverse effects to medication and feels well. Refuced EF has had no negative impact on his life. Continues to work out, go hiking and helps with boxing. Has not had any SOB but has noticed his resting pulse rate has decreased slightly since carvedilol increase.  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  He and his daughter plan to [place] next week.  Also is seen by [place].  Recently reports vivid dreams have started reoccurring which is disrupting his sleep.    Home BP readings he reports have been in 110s-120s   Current CHF meds:  Losartan 25mg  daily Carvedilol 9.375mg  BID  BP goal: <130/80  Wt Readings from Last 3 Encounters:  06/26/21 189 lb 8 oz (86 kg)  06/12/21 190 lb 1.6 oz (86.2 kg)  06/07/21 194 lb 3.2 oz (88.1 kg)   BP Readings from Last 3 Encounters:  06/26/21 120/64  06/12/21 129/78  06/07/21 115/70   /Pulse Readings from Last 3 Encounters:  06/26/21 69  06/12/21 71  06/07/21 70    Renal function: Estimated Creatinine Clearance: 84.5 mL/min (by C-G formula based on SCr of 0.88 mg/dL).  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 90 tablet 3   aspirin EC 81 MG tablet Take 81 mg by mouth daily.     B Complex Vitamins (B-COMPLEX/B-12) TABS Take 1 tablet by mouth daily.     bisacodyl (DULCOLAX) 5 MG EC tablet Take 5 mg by mouth daily as needed for moderate constipation.     carboxymethylcellulose (REFRESH PLUS) 0.5 % SOLN Place 1 drop into both eyes 3 (three) times daily as needed (dry eyes).     carvedilol (COREG) 6.25 MG tablet Take 1.5 tablets (9.375 mg total) by mouth 2 (two) times daily. 270 tablet 3   cholecalciferol (VITAMIN D3) 25 MCG (1000 UNIT) tablet Take 1,000 Units by mouth daily.     cyclobenzaprine (FLEXERIL) 5 MG tablet Take 1 tablet (5 mg total) by mouth at bedtime as needed for muscle spasms. 30 tablet 1   diphenhydramine-acetaminophen (TYLENOL PM EXTRA STRENGTH) 25-500 MG TABS tablet Take 1 tablet by mouth at bedtime as needed (pain).     gabapentin (NEURONTIN) 300 MG capsule TAKE 3 CAPSULE BY MOUTH THREE TIMES DAILY. 3 CAPSULE IN  THE MORNING 3 CAPSULE IN THE AFTERNOON AND 3 CAPSULE AT NIGHT 270 capsule 3   losartan (COZAAR) 25 MG tablet Take 25 mg by mouth daily.     Magnesium 100 MG TABS Take 100 mg by mouth daily.     Multiple Vitamin (MULTIVITAMIN) capsule Take 1 capsule by mouth daily.     sodium chloride (OCEAN) 0.65 % SOLN nasal spray Place 1 spray into both nostrils daily as needed for congestion.     traMADol (ULTRAM) 50 MG tablet Take 1 tablet (50 mg total) by mouth every 6 (six) hours as needed. 30 tablet 1   No current facility-administered medications on file prior to visit.    No Known Allergies   Assessment/Plan:  1. CHF -  Patient BP in room 162/80 which is above goal of <130/80 and higher than his home readings however did not bring machine or log. Verified twice.  Possible home cuff is not accurate. Advised for him to bring in to follow up  appointment.  Needs further BP lowering at this point. Will increase losartan to $RemoveBef'50mg'nFgRKhYstB$  once daily.  He is asymptomatic from a CHF perspective but would likely benefit from starting further GDMT.  Discussed addition of a SGLT2i and patient is agreeable.  Will reach out to oncology to make sure they are not opposed. Already has lab appt scheduled for oncology. Recheck in 4 weeks.  [facility] and [facility] disturbances.  Continue  Carvedilol 9.$RemoveBefor'375mg'zcLdeBMLtOLw$  Bid Increase Losartan to $RemoveBef'50mg'yXdvHzNqnw$  daily Consider addition of Farxiga/Jardiance  Karren Cobble, PharmD, [facility], [place], [place], [place], [place], [address] Phone: [phone], Fax: [phone]

## 2021-08-09 DIAGNOSIS — C9000 Multiple myeloma not having achieved remission: Secondary | ICD-10-CM | POA: Diagnosis not present

## 2021-08-09 DIAGNOSIS — Z9484 Stem cells transplant status: Secondary | ICD-10-CM | POA: Diagnosis not present

## 2021-08-13 ENCOUNTER — Other Ambulatory Visit: Payer: Self-pay | Admitting: *Deleted

## 2021-08-13 LAB — MULTIPLE MYELOMA PANEL, SERUM
Albumin SerPl Elph-Mcnc: 3.8 g/dL (ref 2.9–4.4)
Albumin/Glob SerPl: 1.6 (ref 0.7–1.7)
Alpha 1: 0.3 g/dL (ref 0.0–0.4)
Alpha2 Glob SerPl Elph-Mcnc: 0.7 g/dL (ref 0.4–1.0)
B-Globulin SerPl Elph-Mcnc: 1 g/dL (ref 0.7–1.3)
Gamma Glob SerPl Elph-Mcnc: 0.5 g/dL (ref 0.4–1.8)
Globulin, Total: 2.4 g/dL (ref 2.2–3.9)
IgA: 31 mg/dL — ABNORMAL LOW (ref 61–437)
IgG (Immunoglobin G), Serum: 629 mg/dL (ref 603–1613)
IgM (Immunoglobulin M), Srm: 7 mg/dL — ABNORMAL LOW (ref 15–143)
M Protein SerPl Elph-Mcnc: 0.2 g/dL — ABNORMAL HIGH
Total Protein ELP: 6.2 g/dL (ref 6.0–8.5)

## 2021-08-13 MED ORDER — GABAPENTIN 300 MG PO CAPS: ORAL_CAPSULE | ORAL | 3 refills | Status: DC

## 2021-08-20 DIAGNOSIS — C9002 Multiple myeloma in relapse: Secondary | ICD-10-CM | POA: Diagnosis not present

## 2021-08-20 DIAGNOSIS — C9001 Multiple myeloma in remission: Secondary | ICD-10-CM | POA: Diagnosis not present

## 2021-08-20 DIAGNOSIS — C9000 Multiple myeloma not having achieved remission: Secondary | ICD-10-CM | POA: Diagnosis not present

## 2021-08-20 DIAGNOSIS — Z809 Family history of malignant neoplasm, unspecified: Secondary | ICD-10-CM | POA: Diagnosis not present

## 2021-08-20 DIAGNOSIS — F1090 Alcohol use, unspecified, uncomplicated: Secondary | ICD-10-CM | POA: Diagnosis not present

## 2021-08-20 DIAGNOSIS — Z7952 Long term (current) use of systemic steroids: Secondary | ICD-10-CM | POA: Diagnosis not present

## 2021-08-20 DIAGNOSIS — R61 Generalized hyperhidrosis: Secondary | ICD-10-CM | POA: Diagnosis not present

## 2021-08-20 DIAGNOSIS — Z79620 Long term (current) use of immunosuppressive biologic: Secondary | ICD-10-CM | POA: Diagnosis not present

## 2021-08-20 DIAGNOSIS — Z9484 Stem cells transplant status: Secondary | ICD-10-CM | POA: Diagnosis not present

## 2021-08-21 ENCOUNTER — Inpatient Hospital Stay (HOSPITAL_BASED_OUTPATIENT_CLINIC_OR_DEPARTMENT_OTHER): Payer: Medicare Other | Admitting: Oncology

## 2021-08-21 ENCOUNTER — Inpatient Hospital Stay: Payer: Medicare Other

## 2021-08-21 ENCOUNTER — Other Ambulatory Visit: Payer: Self-pay

## 2021-08-21 VITALS — BP 136/78 | HR 76 | Temp 97.8°F | Resp 18 | Ht 72.0 in | Wt 201.8 lb

## 2021-08-21 DIAGNOSIS — Z5112 Encounter for antineoplastic immunotherapy: Secondary | ICD-10-CM | POA: Diagnosis not present

## 2021-08-21 DIAGNOSIS — C9000 Multiple myeloma not having achieved remission: Secondary | ICD-10-CM

## 2021-08-21 DIAGNOSIS — I429 Cardiomyopathy, unspecified: Secondary | ICD-10-CM | POA: Diagnosis not present

## 2021-08-21 DIAGNOSIS — G629 Polyneuropathy, unspecified: Secondary | ICD-10-CM | POA: Diagnosis not present

## 2021-08-21 DIAGNOSIS — C9002 Multiple myeloma in relapse: Secondary | ICD-10-CM | POA: Diagnosis not present

## 2021-08-21 DIAGNOSIS — D649 Anemia, unspecified: Secondary | ICD-10-CM | POA: Diagnosis not present

## 2021-08-21 LAB — CMP (CANCER CENTER ONLY)
ALT: 11 U/L (ref 0–44)
AST: 14 U/L — ABNORMAL LOW (ref 15–41)
Albumin: 4.3 g/dL (ref 3.5–5.0)
Alkaline Phosphatase: 25 U/L — ABNORMAL LOW (ref 38–126)
Anion gap: 3 — ABNORMAL LOW (ref 5–15)
BUN: 18 mg/dL (ref 8–23)
CO2: 31 mmol/L (ref 22–32)
Calcium: 9.7 mg/dL (ref 8.9–10.3)
Chloride: 107 mmol/L (ref 98–111)
Creatinine: 0.94 mg/dL (ref 0.61–1.24)
GFR, Estimated: >60 mL/min (ref >60)
Glucose, Bld: 107 mg/dL — ABNORMAL HIGH (ref 70–99)
Potassium: 4.4 mmol/L (ref 3.5–5.1)
Sodium: 141 mmol/L (ref 135–145)
Total Bilirubin: 0.7 mg/dL (ref 0.3–1.2)
Total Protein: 6.4 g/dL — ABNORMAL LOW (ref 6.5–8.1)

## 2021-08-21 LAB — CBC WITH DIFFERENTIAL (CANCER CENTER ONLY)
Abs Immature Granulocytes: 0.02 10*3/uL (ref 0.00–0.07)
Basophils Absolute: 0 10*3/uL (ref 0.0–0.1)
Basophils Relative: 1 %
Eosinophils Absolute: 0.3 10*3/uL (ref 0.0–0.5)
Eosinophils Relative: 4 %
HCT: 38.9 % — ABNORMAL LOW (ref 39.0–52.0)
Hemoglobin: 13.4 g/dL (ref 13.0–17.0)
Immature Granulocytes: 0 %
Lymphocytes Relative: 12 %
Lymphs Abs: 0.7 10*3/uL (ref 0.7–4.0)
MCH: 33.3 pg (ref 26.0–34.0)
MCHC: 34.4 g/dL (ref 30.0–36.0)
MCV: 96.8 fL (ref 80.0–100.0)
Monocytes Absolute: 0.7 10*3/uL (ref 0.1–1.0)
Monocytes Relative: 12 %
Neutro Abs: 4.5 10*3/uL (ref 1.7–7.7)
Neutrophils Relative %: 71 %
Platelet Count: 232 10*3/uL (ref 150–400)
RBC: 4.02 MIL/uL — ABNORMAL LOW (ref 4.22–5.81)
RDW: 13.4 % (ref 11.5–15.5)
WBC Count: 6.3 10*3/uL (ref 4.0–10.5)
nRBC: 0 % (ref 0.0–0.2)

## 2021-08-21 MED ORDER — DARATUMUMAB-HYALURONIDASE-FIHJ 1800-30000 MG-UT/15ML ~~LOC~~ SOLN
1800.0000 mg | Freq: Once | SUBCUTANEOUS | Status: AC
  Administered 2021-08-21: 1800 mg via SUBCUTANEOUS
  Filled 2021-08-21: qty 15

## 2021-08-21 MED ORDER — DEXAMETHASONE 4 MG PO TABS
20.0000 mg | ORAL_TABLET | Freq: Once | ORAL | Status: AC
  Administered 2021-08-21: 20 mg via ORAL
  Filled 2021-08-21: qty 5

## 2021-08-21 MED ORDER — ACETAMINOPHEN 325 MG PO TABS
650.0000 mg | ORAL_TABLET | Freq: Once | ORAL | Status: AC
  Administered 2021-08-21: 650 mg via ORAL
  Filled 2021-08-21: qty 2

## 2021-08-21 MED ORDER — DIPHENHYDRAMINE HCL 25 MG PO CAPS
50.0000 mg | ORAL_CAPSULE | Freq: Once | ORAL | Status: AC
  Administered 2021-08-21: 50 mg via ORAL
  Filled 2021-08-21: qty 2

## 2021-08-21 NOTE — Progress Notes (Signed)
Hematology and Oncology Follow Up Visit  Alexander Saunders [id number] September 24, 1950 71 y.o. 08/21/2021 10:56 AM [name], [name] MDOsei-Bonsu, [name], MD   Principle Diagnosis: 71 year old man with multiple myeloma diagnosed in 2018 after presenting with plasmacytoma.  He was found to have kappa free light chain with relapsed disease 2023 including increased bone marrow involvement.   Prior Therapy:  He is status post surgical decompression of an epidural mass on 09/04/2016 and the pathology showed a plasma cell neoplasm.  He is S/P adjuvant radiation therapy to the thoracic spine to be completed on 10/14/2016.  Velcade 1.5 mg/m weekly with dexamethasone 20 mg and Cytoxan 600 mg po. Therapy started on 10/24/2016.   High-dose chemotherapy followed by stem cell transplant: Melphalan 200 mg/m2 completed on 06/17/17 followed by autologous stem cell rescue 06/18/17.  He achieved complete response.   Zometa 4 mg every 3 months started in February 2020.  Last treatment given in January 2022.   Revlimid 10 mg daily for 21 days out of a 28-day started in February 2020.  Therapy discontinued in November 2022 because of worsening anemia.  Carfilzomib 20 mg/m weekly started on February 19, 2021.  Therapy stopped in March 2023 for presumed cardiomyopathy complication.   Daratumumab 1800 mg subcutaneous injection weekly started on February 19, 2021. Dexamethasone weekly started on February 19, 2021.  Current therapy:    Daratumumab 1800 mg subcutaneous injections per protocol currently every 14 days with Dexamethasone weekly 20 mg.    Interim History:  Mr. Grieco is here for a follow-up visit.  Since last visit, he reports feeling well without any major complaints.  He denies any complications related to daratumumab.  He denies any worsening neuropathy or excessive fatigue or bone pain.  He denies any hospitalizations or illnesses.     Medications: Reviewed without changes. Current Outpatient  Medications  Medication Sig Dispense Refill   acyclovir (ZOVIRAX) 400 MG tablet Take 1 tablet (400 mg total) by mouth daily. 90 tablet 3   aspirin EC 81 MG tablet Take 81 mg by mouth daily.     B Complex Vitamins (B-COMPLEX/B-12) TABS Take 1 tablet by mouth daily.     bisacodyl (DULCOLAX) 5 MG EC tablet Take 5 mg by mouth daily as needed for moderate constipation.     carboxymethylcellulose (REFRESH PLUS) 0.5 % SOLN Place 1 drop into both eyes 3 (three) times daily as needed (dry eyes).     carvedilol (COREG) 6.25 MG tablet Take 1.5 tablets (9.375 mg total) by mouth 2 (two) times daily. 270 tablet 3   cholecalciferol (VITAMIN D3) 25 MCG (1000 UNIT) tablet Take 1,000 Units by mouth daily.     cyclobenzaprine (FLEXERIL) 5 MG tablet Take 1 tablet (5 mg total) by mouth at bedtime as needed for muscle spasms. 30 tablet 1   diphenhydramine-acetaminophen (TYLENOL PM EXTRA STRENGTH) 25-500 MG TABS tablet Take 1 tablet by mouth at bedtime as needed (pain).     empagliflozin (JARDIANCE) 10 MG TABS tablet Take 1 tablet by mouth daily in the morning 90 tablet 3   gabapentin (NEURONTIN) 300 MG capsule TAKE 3 CAPSULE BY MOUTH THREE TIMES DAILY. 3 CAPSULE IN THE MORNING 3 CAPSULE IN THE AFTERNOON AND 3 CAPSULE AT NIGHT 270 capsule 3   losartan (COZAAR) 25 MG tablet Take 1 tablet (25 mg total) by mouth daily. 90 tablet 1   Magnesium 100 MG TABS Take 100 mg by mouth daily.     Multiple Vitamin (MULTIVITAMIN) capsule Take 1 capsule by mouth  daily.     sodium chloride (OCEAN) 0.65 % SOLN nasal spray Place 1 spray into both nostrils daily as needed for congestion.     traMADol (ULTRAM) 50 MG tablet Take 1 tablet (50 mg total) by mouth every 6 (six) hours as needed. 30 tablet 1   No current facility-administered medications for this visit.     Allergies: No Known Allergies    Physical Exam:        Blood pressure 136/78, pulse 76, temperature 97.8 F (36.6 C), temperature source Temporal, resp. rate  18, height 6' (1.829 m), weight 201 lb 12.8 oz (91.5 kg), SpO2 99 %.           ECOG: 1    General appearance: Alert, awake without any distress. Head: Atraumatic without abnormalities Oropharynx: Without any thrush or ulcers. Eyes: No scleral icterus. Lymph nodes: No lymphadenopathy noted in the cervical, supraclavicular, or axillary nodes Heart:regular rate and rhythm, without any murmurs or gallops.   Lung: Clear to auscultation without any rhonchi, wheezes or dullness to percussion. Abdomin: Soft, nontender without any shifting dullness or ascites. Musculoskeletal: No clubbing or cyanosis. Neurological: No motor or sensory deficits. Skin: No rashes or lesions.             Lab Results: Lab Results  Component Value Date   WBC 11.1 (H) 08/07/2021   HGB 12.9 (L) 08/07/2021   HCT 36.2 (L) 08/07/2021   MCV 93.8 08/07/2021   PLT 241 08/07/2021     Chemistry      Component Value Date/Time   NA 139 08/07/2021 1303   NA 137 02/06/2017 0828   K 4.1 08/07/2021 1303   K 4.0 02/06/2017 0828   CL 106 08/07/2021 1303   CO2 26 08/07/2021 1303   CO2 24 02/06/2017 0828   BUN 21 08/07/2021 1303   BUN 14.9 02/06/2017 0828   CREATININE 0.91 08/07/2021 1303   CREATININE 0.9 02/06/2017 0828      Component Value Date/Time   CALCIUM 10.1 08/07/2021 1303   CALCIUM 9.1 02/06/2017 0828   ALKPHOS 28 (L) 08/07/2021 1303   ALKPHOS 31 (L) 02/06/2017 0828   AST 15 08/07/2021 1303   AST 32 02/06/2017 0828   ALT 14 08/07/2021 1303   ALT 43 02/06/2017 0828   BILITOT 0.5 08/07/2021 1303   BILITOT 0.42 02/06/2017 0828      Latest Reference Range & Units 05/29/21 11:38 07/10/21 11:29 08/07/21 13:04  M Protein SerPl Elph-Mcnc Not Observed g/dL 0.1 (H) (C) 0.2 (H) (C) 0.2 (H) (C)  IFE 1  Comment ! (C) Comment ! (C) Comment ! (C)  Globulin, Total 2.2 - 3.9 g/dL 2.1 (L) (C) 2.1 (L) (C) 2.4 (C)  B-Globulin SerPl Elph-Mcnc 0.7 - 1.3 g/dL 0.7 (C) 0.7 (C) 1.0 (C)  IgG (Immunoglobin  G), Serum 603 - 1,613 mg/dL 663 591 (L) 629  IgM (Immunoglobulin M), Srm 15 - 143 mg/dL 5 (L) 7 (L) 7 (L)  IgA 61 - 437 mg/dL 32 (L) 32 (L) 31 (L)    Latest Reference Range & Units 07/10/21 11:28 08/07/21 13:03  Kappa free light chain 3.3 - 19.4 mg/L 8.3 7.1  Lambda free light chains 5.7 - 26.3 mg/L 4.3 (L) 3.8 (L)  Kappa, lambda light chain ratio 0.26 - 1.65  1.93 (H) 1.87 (H)  (L): Data is abnormally low (H): Data is abnormally high  Impression and Plan:  71 year old man with:   1.  Multiple myeloma diagnosed in 2018 with relapsed disease  in 2022.  He was found to have Kappa light chain and plasmacytoma at that time.  He is currently on daratumumab per protocol without any major complications.  Kyprolis was discontinued for possible cardiac etiology.  Risks and benefits of continuing daratumumab with dexamethasone at this time were discussed.  Protein studies obtained on July 5 showed continued reasonable response.  I recommended the same dose and scheduling and use different salvage therapy upon relapse.  He was evaluated at [facility] and I will continue to follow with a periodically for suggestions regarding different salvage therapy for the future.    2.  Bone health: No evidence of bone disease based on imaging studies.  He has received Zometa previously.   3.  Neuropathy: We will continue to be on gabapentin which is managing his pain.  4.  VZV prophylaxis: Currently on acyclovir without any reactivation.  5.  Anemia: Related to plasma cell disorder.  His hemoglobin improved.  6.  IV access: Peripheral veins will continue to be in use moving forward.  7.  Antiemetics: No nausea or vomiting reported at this time.  Compazine is available to him.  8.  Cardiomyopathy: No exacerbation noted at this time.  Cardiology is following him.  9. Follow-up: In 2 weeks for the next cycle of daratumumab in 4 weeks for MD follow-up.  30 minutes were dedicated to this  visit.  The time was spent on reviewing laboratory data, disease status update and outlining future plan of care discussion.   Zola Button, MD 7/19/202310:56 AM

## 2021-08-26 ENCOUNTER — Other Ambulatory Visit: Payer: Self-pay

## 2021-08-28 ENCOUNTER — Encounter: Payer: Self-pay | Admitting: Pharmacist

## 2021-08-28 ENCOUNTER — Telehealth: Payer: Self-pay | Admitting: Pharmacist

## 2021-08-28 DIAGNOSIS — I10 Essential (primary) hypertension: Secondary | ICD-10-CM

## 2021-08-28 NOTE — Telephone Encounter (Signed)
Received myChart message from patient that his urine had become cloudy with a different odor and stinging when he urinated.  Patient does not have DM. Advised that he should discontinue medication and increase water consumption and if symptoms continued he should consult PCP for possible UTI symptoms.  Patient voiced understanding. Will add medication to intolerance list.

## 2021-09-03 ENCOUNTER — Other Ambulatory Visit: Payer: Self-pay

## 2021-09-04 ENCOUNTER — Inpatient Hospital Stay: Payer: Medicare Other

## 2021-09-04 ENCOUNTER — Inpatient Hospital Stay: Payer: Medicare Other | Attending: Oncology

## 2021-09-04 ENCOUNTER — Other Ambulatory Visit: Payer: Self-pay

## 2021-09-04 ENCOUNTER — Other Ambulatory Visit: Payer: Self-pay | Admitting: Oncology

## 2021-09-04 VITALS — BP 147/80 | HR 72 | Temp 97.6°F | Resp 18 | Wt 199.8 lb

## 2021-09-04 DIAGNOSIS — C9002 Multiple myeloma in relapse: Secondary | ICD-10-CM | POA: Diagnosis not present

## 2021-09-04 DIAGNOSIS — Z5112 Encounter for antineoplastic immunotherapy: Secondary | ICD-10-CM | POA: Insufficient documentation

## 2021-09-04 DIAGNOSIS — C9000 Multiple myeloma not having achieved remission: Secondary | ICD-10-CM

## 2021-09-04 DIAGNOSIS — D649 Anemia, unspecified: Secondary | ICD-10-CM | POA: Insufficient documentation

## 2021-09-04 DIAGNOSIS — G629 Polyneuropathy, unspecified: Secondary | ICD-10-CM | POA: Insufficient documentation

## 2021-09-04 LAB — CBC WITH DIFFERENTIAL (CANCER CENTER ONLY)
Abs Immature Granulocytes: 0.02 10*3/uL (ref 0.00–0.07)
Basophils Absolute: 0 10*3/uL (ref 0.0–0.1)
Basophils Relative: 0 %
Eosinophils Absolute: 0.2 10*3/uL (ref 0.0–0.5)
Eosinophils Relative: 3 %
HCT: 35 % — ABNORMAL LOW (ref 39.0–52.0)
Hemoglobin: 12.3 g/dL — ABNORMAL LOW (ref 13.0–17.0)
Immature Granulocytes: 0 %
Lymphocytes Relative: 11 %
Lymphs Abs: 0.7 10*3/uL (ref 0.7–4.0)
MCH: 33.4 pg (ref 26.0–34.0)
MCHC: 35.1 g/dL (ref 30.0–36.0)
MCV: 95.1 fL (ref 80.0–100.0)
Monocytes Absolute: 0.8 10*3/uL (ref 0.1–1.0)
Monocytes Relative: 13 %
Neutro Abs: 4.7 10*3/uL (ref 1.7–7.7)
Neutrophils Relative %: 73 %
Platelet Count: 225 10*3/uL (ref 150–400)
RBC: 3.68 MIL/uL — ABNORMAL LOW (ref 4.22–5.81)
RDW: 14 % (ref 11.5–15.5)
WBC Count: 6.4 10*3/uL (ref 4.0–10.5)
nRBC: 0 % (ref 0.0–0.2)

## 2021-09-04 LAB — CMP (CANCER CENTER ONLY)
ALT: 12 U/L (ref 0–44)
AST: 19 U/L (ref 15–41)
Albumin: 4.4 g/dL (ref 3.5–5.0)
Alkaline Phosphatase: 25 U/L — ABNORMAL LOW (ref 38–126)
Anion gap: 4 — ABNORMAL LOW (ref 5–15)
BUN: 16 mg/dL (ref 8–23)
CO2: 28 mmol/L (ref 22–32)
Calcium: 9.4 mg/dL (ref 8.9–10.3)
Chloride: 107 mmol/L (ref 98–111)
Creatinine: 0.87 mg/dL (ref 0.61–1.24)
GFR, Estimated: >60 mL/min (ref >60)
Glucose, Bld: 110 mg/dL — ABNORMAL HIGH (ref 70–99)
Potassium: 3.8 mmol/L (ref 3.5–5.1)
Sodium: 139 mmol/L (ref 135–145)
Total Bilirubin: 1.1 mg/dL (ref 0.3–1.2)
Total Protein: 6.6 g/dL (ref 6.5–8.1)

## 2021-09-04 MED ORDER — DEXAMETHASONE 4 MG PO TABS
20.0000 mg | ORAL_TABLET | Freq: Once | ORAL | Status: AC
  Administered 2021-09-04: 20 mg via ORAL
  Filled 2021-09-04: qty 5

## 2021-09-04 MED ORDER — DARATUMUMAB-HYALURONIDASE-FIHJ 1800-30000 MG-UT/15ML ~~LOC~~ SOLN
1800.0000 mg | Freq: Once | SUBCUTANEOUS | Status: AC
  Administered 2021-09-04: 1800 mg via SUBCUTANEOUS
  Filled 2021-09-04: qty 15

## 2021-09-04 NOTE — Progress Notes (Signed)
Patient opted not to stay for 30 minutes- has had mutliple doses with no issues. Stable upon Discharge.

## 2021-09-04 NOTE — Patient Instructions (Signed)
Alexander Saunders ONCOLOGY  Discharge Instructions: Thank you for choosing [facility] to provide your oncology and hematology care.   If you have a lab appointment with the [facility], please go directly to the [facility] and check in at the registration area.   Wear comfortable clothing and clothing appropriate for easy access to any Portacath or PICC line.   We strive to give you quality time with your provider. You may need to reschedule your appointment if you arrive late (15 or more minutes).  Arriving late affects you and other patients whose appointments are after yours.  Also, if you miss three or more appointments without notifying the office, you may be dismissed from the clinic at the provider's discretion.      For prescription refill requests, have your pharmacy contact our office and allow 72 hours for refills to be completed.    Today you received the following chemotherapy and/or immunotherapy agents: Daratumamab with Hyaluronidase    To help prevent nausea and vomiting after your treatment, we encourage you to take your nausea medication as directed.  BELOW ARE SYMPTOMS THAT SHOULD BE REPORTED IMMEDIATELY: *FEVER GREATER THAN 100.4 F (38 C) OR HIGHER *CHILLS OR SWEATING *NAUSEA AND VOMITING THAT IS NOT CONTROLLED WITH YOUR NAUSEA MEDICATION *UNUSUAL SHORTNESS OF BREATH *UNUSUAL BRUISING OR BLEEDING *URINARY PROBLEMS (pain or burning when urinating, or frequent urination) *BOWEL PROBLEMS (unusual diarrhea, constipation, pain near the anus) TENDERNESS IN MOUTH AND THROAT WITH OR WITHOUT PRESENCE OF ULCERS (sore throat, sores in mouth, or a toothache) UNUSUAL RASH, SWELLING OR PAIN  UNUSUAL VAGINAL DISCHARGE OR ITCHING   Items with * indicate a potential emergency and should be followed up as soon as possible or go to the Emergency Department if any problems should occur.  Please show the CHEMOTHERAPY ALERT CARD or IMMUNOTHERAPY ALERT  CARD at check-in to the Emergency Department and triage nurse.  Should you have questions after your visit or need to cancel or reschedule your appointment, please contact [facility]  Dept: [phone]  and follow the prompts.  Office hours are 8:00 a.m. to 4:30 p.m. Monday - Friday. Please note that voicemails left after 4:00 p.m. may not be returned until the following business day.  We are closed weekends and major holidays. You have access to a nurse at all times for urgent questions. Please call the main number to the clinic Dept: [phone] and follow the prompts.   For any non-urgent questions, you may also contact your provider using MyChart. We now offer e-Visits for anyone 72 and older to request care online for non-urgent symptoms. For details visit [email or web address].   Also download the MyChart app! Go to the app store, search "MyChart", open the app, select [facility], and log in with your MyChart username and password.  Masks are optional in the cancer centers. If you would like for your care team to wear a mask while they are taking care of you, please let them know. For doctor visits, patients may have with them one support person who is at least 72 years old. At this time, visitors are not allowed in the infusion area.

## 2021-09-06 ENCOUNTER — Other Ambulatory Visit: Payer: Self-pay

## 2021-09-06 ENCOUNTER — Ambulatory Visit (HOSPITAL_COMMUNITY): Payer: Medicare Other | Attending: Cardiology

## 2021-09-06 DIAGNOSIS — R002 Palpitations: Secondary | ICD-10-CM | POA: Insufficient documentation

## 2021-09-06 DIAGNOSIS — I5020 Unspecified systolic (congestive) heart failure: Secondary | ICD-10-CM | POA: Insufficient documentation

## 2021-09-06 DIAGNOSIS — E782 Mixed hyperlipidemia: Secondary | ICD-10-CM | POA: Insufficient documentation

## 2021-09-06 LAB — ECHOCARDIOGRAM COMPLETE
Area-P 1/2: 3.06 cm2
S' Lateral: 4.9 cm

## 2021-09-10 MED ORDER — LOSARTAN POTASSIUM 50 MG PO TABS: 50.0000 mg | ORAL_TABLET | Freq: Every day | ORAL | 1 refills | Status: DC

## 2021-09-11 ENCOUNTER — Other Ambulatory Visit: Payer: Self-pay

## 2021-09-11 ENCOUNTER — Encounter: Payer: Self-pay | Admitting: Pharmacist Clinician (PhC)/ Clinical Pharmacy Specialist

## 2021-09-11 ENCOUNTER — Ambulatory Visit (INDEPENDENT_AMBULATORY_CARE_PROVIDER_SITE_OTHER): Payer: Medicare Other | Admitting: Pharmacist Clinician (PhC)/ Clinical Pharmacy Specialist

## 2021-09-11 VITALS — BP 127/76 | HR 70

## 2021-09-11 DIAGNOSIS — I5020 Unspecified systolic (congestive) heart failure: Secondary | ICD-10-CM

## 2021-09-11 DIAGNOSIS — I10 Essential (primary) hypertension: Secondary | ICD-10-CM

## 2021-09-11 MED ORDER — SPIRONOLACTONE 25 MG PO TABS: 25.0000 mg | ORAL_TABLET | Freq: Every day | ORAL | 3 refills | Status: DC

## 2021-09-11 NOTE — Assessment & Plan Note (Signed)
Patient with HFrEF caused by chemotherapy.  Most recent EF at 30-35%.  Currently on 2/4 medications for GDMT and recently failed trial of Jardiance secondary to UTI symptoms.  Explained purpose and benefit of GDMT.  Will start spironolactone 12.5 mg daily.  He will need to get BMET in 2 weeks.  Scheduled to follow up with Dr. [name] in September.

## 2021-09-11 NOTE — Progress Notes (Signed)
09/11/2021 Alexander Saunders 1950-05-13 [id number]   HPI:  Alexander Saunders is a 71 y.o. male patient of Dr [name], who presents today for heart failure medication titration.  Otherwise his medical history is significant for hypertension and multiple myeloma.  Echocardiogram done just last week showed EF improved some, from 25-30% to 30-35%.   He was seen most recently by Karren Cobble PharmD about a month ago, at which time his BP in the office was 162/80.  Patient reported that home readings were commonly in the 110-034 systolic range, although he did not have his home cuff or log of readings with him.  He was started on Jardiance, but unfortunately had to stop secondary to UTI symptoms.    Today he returns for follow up.  He is feeling well and has no complaints.  HFrEF was a side effect of his chemotherapy and he is hopeful to get pumping function back to normal in the future.    Blood Pressure Goal:  130/80  Current Medications: losartan 50 mg qd, carvedilol 9.275 mg bid  Social Hx: no tobacco, occasional social alcohol; cut back to 2 cups of coffee/day  Diet: mostly home cooked, rarely fried foods; some vegetables, salad and broccoli; quit snacking for the most part  Exercise: licensed personal trainer, walks daily; trying to do more cardio, moitors HR to keep appropriate for age  Home BP readings: [place] about 72 years old -read 2/7 points from office cuff 8 home readings in last 2 weeks average 129/80  Intolerances: SGLT2 inhibitor caused UTI  Labs:  09/04/21:  Na 139, K 3.8 ,Glu 110, BUN 16, SCr 0.87, GFR > 60   Wt Readings from Last 3 Encounters:  09/04/21 199 lb 12 oz (90.6 kg)  08/21/21 201 lb 12.8 oz (91.5 kg)  08/08/21 202 lb 12.8 oz (92 kg)   BP Readings from Last 3 Encounters:  09/11/21 127/76  09/04/21 (!) 147/80  08/21/21 136/78   Pulse Readings from Last 3 Encounters:  09/11/21 70  09/04/21 72  08/21/21 76    Current Outpatient Medications  Medication Sig  Dispense Refill   acyclovir (ZOVIRAX) 400 MG tablet Take 1 tablet (400 mg total) by mouth daily. 90 tablet 3   aspirin EC 81 MG tablet Take 81 mg by mouth daily.     B Complex Vitamins (B-COMPLEX/B-12) TABS Take 1 tablet by mouth daily.     bisacodyl (DULCOLAX) 5 MG EC tablet Take 5 mg by mouth daily as needed for moderate constipation.     carboxymethylcellulose (REFRESH PLUS) 0.5 % SOLN Place 1 drop into both eyes 3 (three) times daily as needed (dry eyes).     carvedilol (COREG) 6.25 MG tablet Take 1.5 tablets (9.375 mg total) by mouth 2 (two) times daily. 270 tablet 3   cholecalciferol (VITAMIN D3) 25 MCG (1000 UNIT) tablet Take 1,000 Units by mouth daily.     diphenhydramine-acetaminophen (TYLENOL PM EXTRA STRENGTH) 25-500 MG TABS tablet Take 1 tablet by mouth at bedtime as needed (pain).     gabapentin (NEURONTIN) 300 MG capsule TAKE 3 CAPSULE BY MOUTH THREE TIMES DAILY. 3 CAPSULE IN THE MORNING 3 CAPSULE IN THE AFTERNOON AND 3 CAPSULE AT NIGHT 270 capsule 3   losartan (COZAAR) 50 MG tablet Take 1 tablet (50 mg total) by mouth daily. 90 tablet 1   Multiple Vitamin (MULTIVITAMIN) capsule Take 1 capsule by mouth daily.     sodium chloride (OCEAN) 0.65 % SOLN nasal spray Place  1 spray into both nostrils daily as needed for congestion.     spironolactone (ALDACTONE) 25 MG tablet Take 1 tablet (25 mg total) by mouth daily. 15 tablet 3   traMADol (ULTRAM) 50 MG tablet Take 1 tablet (50 mg total) by mouth every 6 (six) hours as needed. 30 tablet 1   No current facility-administered medications for this visit.    Allergies  Allergen Reactions   Jardiance [Empagliflozin] Other (See Comments)    UTI like symptoms    Past Medical History:  Diagnosis Date   Anxiety    occasional    Blood transfusion without reported diagnosis 2018   with spinal surgery   History of chemotherapy    completed 06-17-2017   History of radiation therapy    completed 11-2016 per pt   Multiple myeloma ([facility]) 2018    currently in remission    Neuromuscular disorder ([place])    neuropathy lower extremeties, primarily right leg    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   Stem cells transplant status ([facility]) 06/2017    Blood pressure 127/76, pulse 70.  HFrEF (heart failure with reduced ejection fraction) ([place]) Patient with HFrEF caused by chemotherapy.  Most recent EF at 30-35%.  Currently on 2/4 medications for GDMT and recently failed trial of Jardiance secondary to UTI symptoms.  Explained purpose and benefit of GDMT.  Will start spironolactone 12.5 mg daily.  He will need to get BMET in 2 weeks.  Scheduled to follow up with Dr. [name] in September.     Tommy Medal PharmD CPP [facility] [address] [place], [place] [address] [phone]

## 2021-09-11 NOTE — Patient Outreach (Signed)
  Care Coordination   Initial Visit Note   09/11/2021 Name: COLLINS KERBY MRN: [id number] DOB: 24-May-1950  [name] is a 71 y.o. year old male who sees [name], [name], MD for primary care. I spoke with  [name] by phone today  What matters to the patients health and wellness today?  Improve heart health    Goals Addressed             This Visit's Progress    improve heart health       Care Coordination Interventions: Discussed annual wellness visit. Reports completed at the [facility]. Advised patient to contact primary care to get on primary care provider's schedule Assessed social determinant of health barriers Reviewed medications and encouraged to take as prescribed Reviewed upcoming/scheduled appointments          SDOH assessments and interventions completed:  Yes  SDOH Interventions Today    Flowsheet Row Most Recent Value  SDOH Interventions   Food Insecurity Interventions Intervention Not Indicated  Housing Interventions Intervention Not Indicated  Transportation Interventions Intervention Not Indicated        Care Coordination Interventions Activated:  Yes  Care Coordination Interventions:  Yes, provided   Follow up plan: Follow up call scheduled for 10/17/21    Encounter Outcome:  Pt. Visit Completed   Thea Silversmith, RN, MSN, BSN, [facility] Coordinator [phone]

## 2021-09-11 NOTE — Patient Instructions (Signed)
Return for a a follow up appointment in September 5 at 8:15 am  Go to the lab in 2 weeks to check kidney function, cholesterol and liver  Check your blood pressure at home daily (if able) and keep record of the readings.  Take your BP meds as follows:  Start spironolactone 12.5 mg (1/2 of 25 mg tablet) once daily  Continue with all other medications  Bring all of your meds, your BP cuff and your record of home blood pressures to your next appointment.  Exercise as you're able, try to walk approximately 30 minutes per day.  Keep salt intake to a minimum, especially watch canned and prepared boxed foods.  Eat more fresh fruits and vegetables and fewer canned items.  Avoid eating in fast food restaurants.    HOW TO TAKE YOUR BLOOD PRESSURE: Rest 5 minutes before taking your blood pressure.  Don't smoke or drink caffeinated beverages for at least 30 minutes before. Take your blood pressure before (not after) you eat. Sit comfortably with your back supported and both feet on the floor (don't cross your legs). Elevate your arm to heart level on a table or a desk. Use the proper sized cuff. It should fit smoothly and snugly around your bare upper arm. There should be enough room to slip a fingertip under the cuff. The bottom edge of the cuff should be 1 inch above the crease of the elbow. Ideally, take 3 measurements at one sitting and record the average.

## 2021-09-11 NOTE — Patient Outreach (Signed)
  Care Coordination   09/11/2021 Name: Alexander Saunders MRN: [id number] DOB: Jul 20, 1950   Care Coordination Outreach Attempts:  An unsuccessful telephone outreach was attempted today to offer the patient information about available care coordination services as a benefit of their health plan.   Follow Up Plan:  Additional outreach attempts will be made to offer the patient care coordination information and services.   Encounter Outcome:  No Answer  Care Coordination Interventions Activated:  No   Care Coordination Interventions:  No, not indicated    Thea Silversmith, RN, MSN, BSN, CCM Care Coordinator [phone]

## 2021-09-18 ENCOUNTER — Inpatient Hospital Stay: Payer: Medicare Other

## 2021-09-18 ENCOUNTER — Other Ambulatory Visit: Payer: Self-pay

## 2021-09-18 VITALS — BP 114/78 | HR 72 | Temp 98.2°F | Resp 18

## 2021-09-18 DIAGNOSIS — G629 Polyneuropathy, unspecified: Secondary | ICD-10-CM | POA: Diagnosis not present

## 2021-09-18 DIAGNOSIS — Z5112 Encounter for antineoplastic immunotherapy: Secondary | ICD-10-CM | POA: Diagnosis not present

## 2021-09-18 DIAGNOSIS — D649 Anemia, unspecified: Secondary | ICD-10-CM | POA: Diagnosis not present

## 2021-09-18 DIAGNOSIS — C9000 Multiple myeloma not having achieved remission: Secondary | ICD-10-CM

## 2021-09-18 DIAGNOSIS — C9002 Multiple myeloma in relapse: Secondary | ICD-10-CM | POA: Diagnosis not present

## 2021-09-18 LAB — CBC WITH DIFFERENTIAL (CANCER CENTER ONLY)
Abs Immature Granulocytes: 0.01 10*3/uL (ref 0.00–0.07)
Basophils Absolute: 0 10*3/uL (ref 0.0–0.1)
Basophils Relative: 0 %
Eosinophils Absolute: 0.2 10*3/uL (ref 0.0–0.5)
Eosinophils Relative: 2 %
HCT: 35.7 % — ABNORMAL LOW (ref 39.0–52.0)
Hemoglobin: 12.4 g/dL — ABNORMAL LOW (ref 13.0–17.0)
Immature Granulocytes: 0 %
Lymphocytes Relative: 9 %
Lymphs Abs: 0.6 10*3/uL — ABNORMAL LOW (ref 0.7–4.0)
MCH: 33.2 pg (ref 26.0–34.0)
MCHC: 34.7 g/dL (ref 30.0–36.0)
MCV: 95.7 fL (ref 80.0–100.0)
Monocytes Absolute: 0.7 10*3/uL (ref 0.1–1.0)
Monocytes Relative: 11 %
Neutro Abs: 5 10*3/uL (ref 1.7–7.7)
Neutrophils Relative %: 78 %
Platelet Count: 265 10*3/uL (ref 150–400)
RBC: 3.73 MIL/uL — ABNORMAL LOW (ref 4.22–5.81)
RDW: 13.7 % (ref 11.5–15.5)
WBC Count: 6.5 10*3/uL (ref 4.0–10.5)
nRBC: 0 % (ref 0.0–0.2)

## 2021-09-18 LAB — CMP (CANCER CENTER ONLY)
ALT: 12 U/L (ref 0–44)
AST: 15 U/L (ref 15–41)
Albumin: 4.4 g/dL (ref 3.5–5.0)
Alkaline Phosphatase: 21 U/L — ABNORMAL LOW (ref 38–126)
Anion gap: 3 — ABNORMAL LOW (ref 5–15)
BUN: 19 mg/dL (ref 8–23)
CO2: 29 mmol/L (ref 22–32)
Calcium: 9.7 mg/dL (ref 8.9–10.3)
Chloride: 106 mmol/L (ref 98–111)
Creatinine: 0.91 mg/dL (ref 0.61–1.24)
GFR, Estimated: >60 mL/min (ref >60)
Glucose, Bld: 133 mg/dL — ABNORMAL HIGH (ref 70–99)
Potassium: 4.1 mmol/L (ref 3.5–5.1)
Sodium: 138 mmol/L (ref 135–145)
Total Bilirubin: 0.6 mg/dL (ref 0.3–1.2)
Total Protein: 6.7 g/dL (ref 6.5–8.1)

## 2021-09-18 MED ORDER — DARATUMUMAB-HYALURONIDASE-FIHJ 1800-30000 MG-UT/15ML ~~LOC~~ SOLN: 1800.0000 mg | Freq: Once | SUBCUTANEOUS | Status: DC

## 2021-09-18 MED ORDER — DEXAMETHASONE 4 MG PO TABS
20.0000 mg | ORAL_TABLET | Freq: Once | ORAL | Status: AC
  Administered 2021-09-18: 20 mg via ORAL

## 2021-09-18 MED ORDER — DIPHENHYDRAMINE HCL 25 MG PO CAPS
50.0000 mg | ORAL_CAPSULE | Freq: Once | ORAL | Status: AC
  Administered 2021-09-18: 50 mg via ORAL

## 2021-09-18 MED ORDER — DARATUMUMAB-HYALURONIDASE-FIHJ 1800-30000 MG-UT/15ML ~~LOC~~ SOLN
1800.0000 mg | Freq: Once | SUBCUTANEOUS | Status: AC
  Administered 2021-09-18: 1800 mg via SUBCUTANEOUS
  Filled 2021-09-18: qty 15

## 2021-09-18 MED ORDER — ACETAMINOPHEN 325 MG PO TABS
650.0000 mg | ORAL_TABLET | Freq: Once | ORAL | Status: AC
  Administered 2021-09-18: 650 mg via ORAL

## 2021-09-19 ENCOUNTER — Encounter: Payer: Self-pay | Admitting: Pharmacist

## 2021-09-20 ENCOUNTER — Other Ambulatory Visit: Payer: Self-pay | Admitting: Oncology

## 2021-09-20 DIAGNOSIS — C9000 Multiple myeloma not having achieved remission: Secondary | ICD-10-CM

## 2021-09-27 ENCOUNTER — Encounter: Payer: Self-pay | Admitting: Oncology

## 2021-09-27 DIAGNOSIS — I10 Essential (primary) hypertension: Secondary | ICD-10-CM | POA: Diagnosis not present

## 2021-09-27 LAB — BASIC METABOLIC PANEL
BUN/Creatinine Ratio: 18 (ref 10–24)
BUN: 16 mg/dL (ref 8–27)
CO2: 25 mmol/L (ref 20–29)
Calcium: 10.1 mg/dL (ref 8.6–10.2)
Chloride: 104 mmol/L (ref 96–106)
Creatinine, Ser: 0.89 mg/dL (ref 0.76–1.27)
Glucose: 102 mg/dL — ABNORMAL HIGH (ref 70–99)
Potassium: 5.2 mmol/L (ref 3.5–5.2)
Sodium: 141 mmol/L (ref 134–144)
eGFR: 91 mL/min/{1.73_m2} (ref >59)

## 2021-10-01 MED FILL — Dexamethasone Sodium Phosphate Inj 100 MG/10ML: INTRAMUSCULAR | Qty: 2 | Status: AC

## 2021-10-02 ENCOUNTER — Inpatient Hospital Stay: Payer: Medicare Other

## 2021-10-02 ENCOUNTER — Other Ambulatory Visit: Payer: Self-pay

## 2021-10-02 ENCOUNTER — Inpatient Hospital Stay (HOSPITAL_BASED_OUTPATIENT_CLINIC_OR_DEPARTMENT_OTHER): Payer: Medicare Other | Admitting: Oncology

## 2021-10-02 VITALS — BP 133/72 | HR 65 | Temp 98.2°F | Resp 15 | Wt 200.5 lb

## 2021-10-02 DIAGNOSIS — C9002 Multiple myeloma in relapse: Secondary | ICD-10-CM | POA: Diagnosis not present

## 2021-10-02 DIAGNOSIS — D649 Anemia, unspecified: Secondary | ICD-10-CM | POA: Diagnosis not present

## 2021-10-02 DIAGNOSIS — Z5112 Encounter for antineoplastic immunotherapy: Secondary | ICD-10-CM | POA: Diagnosis not present

## 2021-10-02 DIAGNOSIS — C9000 Multiple myeloma not having achieved remission: Secondary | ICD-10-CM

## 2021-10-02 DIAGNOSIS — G629 Polyneuropathy, unspecified: Secondary | ICD-10-CM | POA: Diagnosis not present

## 2021-10-02 LAB — CBC WITH DIFFERENTIAL (CANCER CENTER ONLY)
Abs Immature Granulocytes: 0.01 10*3/uL (ref 0.00–0.07)
Basophils Absolute: 0 10*3/uL (ref 0.0–0.1)
Basophils Relative: 1 %
Eosinophils Absolute: 0.3 10*3/uL (ref 0.0–0.5)
Eosinophils Relative: 4 %
HCT: 35.9 % — ABNORMAL LOW (ref 39.0–52.0)
Hemoglobin: 12.4 g/dL — ABNORMAL LOW (ref 13.0–17.0)
Immature Granulocytes: 0 %
Lymphocytes Relative: 11 %
Lymphs Abs: 0.7 10*3/uL (ref 0.7–4.0)
MCH: 32.9 pg (ref 26.0–34.0)
MCHC: 34.5 g/dL (ref 30.0–36.0)
MCV: 95.2 fL (ref 80.0–100.0)
Monocytes Absolute: 1 10*3/uL (ref 0.1–1.0)
Monocytes Relative: 14 %
Neutro Abs: 4.9 10*3/uL (ref 1.7–7.7)
Neutrophils Relative %: 70 %
Platelet Count: 212 10*3/uL (ref 150–400)
RBC: 3.77 MIL/uL — ABNORMAL LOW (ref 4.22–5.81)
RDW: 13.7 % (ref 11.5–15.5)
WBC Count: 6.9 10*3/uL (ref 4.0–10.5)
nRBC: 0 % (ref 0.0–0.2)

## 2021-10-02 LAB — CMP (CANCER CENTER ONLY)
ALT: 12 U/L (ref 0–44)
AST: 14 U/L — ABNORMAL LOW (ref 15–41)
Albumin: 4.2 g/dL (ref 3.5–5.0)
Alkaline Phosphatase: 23 U/L — ABNORMAL LOW (ref 38–126)
Anion gap: 4 — ABNORMAL LOW (ref 5–15)
BUN: 15 mg/dL (ref 8–23)
CO2: 28 mmol/L (ref 22–32)
Calcium: 9.1 mg/dL (ref 8.9–10.3)
Chloride: 107 mmol/L (ref 98–111)
Creatinine: 0.83 mg/dL (ref 0.61–1.24)
GFR, Estimated: >60 mL/min (ref >60)
Glucose, Bld: 108 mg/dL — ABNORMAL HIGH (ref 70–99)
Potassium: 3.9 mmol/L (ref 3.5–5.1)
Sodium: 139 mmol/L (ref 135–145)
Total Bilirubin: 0.4 mg/dL (ref 0.3–1.2)
Total Protein: 6 g/dL — ABNORMAL LOW (ref 6.5–8.1)

## 2021-10-02 MED ORDER — SODIUM CHLORIDE 0.9 % IV SOLN
20.0000 mg | Freq: Once | INTRAVENOUS | Status: DC
  Filled 2021-10-02: qty 2

## 2021-10-02 MED ORDER — DEXAMETHASONE 4 MG PO TABS
20.0000 mg | ORAL_TABLET | Freq: Once | ORAL | Status: AC
  Administered 2021-10-02: 20 mg via ORAL
  Filled 2021-10-02: qty 5

## 2021-10-02 MED ORDER — DIPHENHYDRAMINE HCL 25 MG PO CAPS
50.0000 mg | ORAL_CAPSULE | Freq: Once | ORAL | Status: AC
  Administered 2021-10-02: 50 mg via ORAL
  Filled 2021-10-02: qty 2

## 2021-10-02 MED ORDER — DARATUMUMAB-HYALURONIDASE-FIHJ 1800-30000 MG-UT/15ML ~~LOC~~ SOLN
1800.0000 mg | Freq: Once | SUBCUTANEOUS | Status: AC
  Administered 2021-10-02: 1800 mg via SUBCUTANEOUS
  Filled 2021-10-02: qty 15

## 2021-10-02 MED ORDER — ACETAMINOPHEN 325 MG PO TABS
650.0000 mg | ORAL_TABLET | Freq: Once | ORAL | Status: AC
  Administered 2021-10-02: 650 mg via ORAL
  Filled 2021-10-02: qty 2

## 2021-10-02 NOTE — Patient Instructions (Signed)
[facility] MEDICAL ONCOLOGY   Discharge Instructions: Thank you for choosing [facility] to provide your oncology and hematology care.   If you have a lab appointment with the Cancer Center, please go directly to the Cancer Center and check in at the registration area.   Wear comfortable clothing and clothing appropriate for easy access to any Portacath or PICC line.   We strive to give you quality time with your provider. You may need to reschedule your appointment if you arrive late (15 or more minutes).  Arriving late affects you and other patients whose appointments are after yours.  Also, if you miss three or more appointments without notifying the office, you may be dismissed from the clinic at the provider's discretion.      For prescription refill requests, have your pharmacy contact our office and allow 72 hours for refills to be completed.    Today you received the following chemotherapy and/or immunotherapy agents: daratumumab-hyaluronidase-fihj      To help prevent nausea and vomiting after your treatment, we encourage you to take your nausea medication as directed.  BELOW ARE SYMPTOMS THAT SHOULD BE REPORTED IMMEDIATELY: *FEVER GREATER THAN 100.4 F (38 C) OR HIGHER *CHILLS OR SWEATING *NAUSEA AND VOMITING THAT IS NOT CONTROLLED WITH YOUR NAUSEA MEDICATION *UNUSUAL SHORTNESS OF BREATH *UNUSUAL BRUISING OR BLEEDING *URINARY PROBLEMS (pain or burning when urinating, or frequent urination) *BOWEL PROBLEMS (unusual diarrhea, constipation, pain near the anus) TENDERNESS IN MOUTH AND THROAT WITH OR WITHOUT PRESENCE OF ULCERS (sore throat, sores in mouth, or a toothache) UNUSUAL RASH, SWELLING OR PAIN  UNUSUAL VAGINAL DISCHARGE OR ITCHING   Items with * indicate a potential emergency and should be followed up as soon as possible or go to the Emergency Department if any problems should occur.  Please show the CHEMOTHERAPY ALERT CARD or IMMUNOTHERAPY  ALERT CARD at check-in to the Emergency Department and triage nurse.  Should you have questions after your visit or need to cancel or reschedule your appointment, please contact [facility]COL[facility]  Dept: [phone]  and follow the prompts.  Office hours are 8:00 a.m. to 4:30 p.m. Monday - Friday. Please note that voicemails left after 4:00 p.m. may not be returned until the following business day.  We are closed weekends and major holidays. You have access to a nurse at all times for urgent questions. Please call the main number to the clinic Dept: [phone] and follow the prompts.   For any non-urgent questions, you may also contact your provider using MyChart. We now offer e-Visits for anyone 18 and older to request care online for non-urgent symptoms. For details visit [email or web address].   Also download the MyChart app! Go to the app store, search "MyChart", open the app, select [facility], and log in with your MyChart username and password.  Masks are optional in the cancer centers. If you would like for your care team to wear a mask while they are taking care of you, please let them know. You may have one support person who is at least 72 years old accompany you for your appointments. 

## 2021-10-02 NOTE — Progress Notes (Signed)
Hematology and Oncology Follow Up Visit  Alexander Saunders [id number] 03-27-1950 71 y.o. 10/02/2021 11:37 AM Alexander, Iona Saunders MDOsei-Bonsu, [name], MD   Principle Diagnosis: 71 year old man with kappa free light chain multiple myeloma diagnosed in 2018 after presenting with plasmacytoma.  He developed relapsed disease 2023.   Prior Therapy:  He is status post surgical decompression of an epidural mass on 09/04/2016 and the pathology showed a plasma cell neoplasm.  He is S/P adjuvant radiation therapy to the thoracic spine to be completed on 10/14/2016.  Velcade 1.5 mg/m weekly with dexamethasone 20 mg and Cytoxan 600 mg po. Therapy started on 10/24/2016.   High-dose chemotherapy followed by stem cell transplant: Melphalan 200 mg/m2 completed on 06/17/17 followed by autologous stem cell rescue 06/18/17.  He achieved complete response.   Zometa 4 mg every 3 months started in February 2020.  Last treatment given in January 2022.   Revlimid 10 mg daily for 21 days out of a 28-day started in February 2020.  Therapy discontinued in November 2022 because of worsening anemia.  Carfilzomib 20 mg/m weekly started on February 19, 2021.  Therapy stopped in March 2023 for presumed cardiomyopathy complication.   Daratumumab 1800 mg subcutaneous injection weekly started on February 19, 2021. Dexamethasone weekly started on February 19, 2021.  Current therapy:    Daratumumab 1800 mg subcutaneous injections per protocol currently every 14 days with Dexamethasone weekly 20 mg.    Interim History:  [name] returns today for repeat evaluation.  Since last visit, he reports no major changes in his health.  He has tolerated the current treatment without any issues.  He denies any nausea, vomiting or abdominal pain.  He denies any hospitalizations or illnesses.  He continues to perform activities of daily living and works close to 12 hours a week.     Medications: Updated on review. Current  Outpatient Medications  Medication Sig Dispense Refill   acyclovir (ZOVIRAX) 400 MG tablet Take 1 tablet (400 mg total) by mouth daily. 90 tablet 3   aspirin EC 81 MG tablet Take 81 mg by mouth daily.     B Complex Vitamins (B-COMPLEX/B-12) TABS Take 1 tablet by mouth daily.     bisacodyl (DULCOLAX) 5 MG EC tablet Take 5 mg by mouth daily as needed for moderate constipation.     carboxymethylcellulose (REFRESH PLUS) 0.5 % SOLN Place 1 drop into both eyes 3 (three) times daily as needed (dry eyes).     carvedilol (COREG) 6.25 MG tablet Take 1.5 tablets (9.375 mg total) by mouth 2 (two) times daily. 270 tablet 3   cholecalciferol (VITAMIN D3) 25 MCG (1000 UNIT) tablet Take 1,000 Units by mouth daily.     diphenhydramine-acetaminophen (TYLENOL PM EXTRA STRENGTH) 25-500 MG TABS tablet Take 1 tablet by mouth at bedtime as needed (pain).     gabapentin (NEURONTIN) 300 MG capsule TAKE 3 CAPSULE BY MOUTH THREE TIMES DAILY. 3 CAPSULE IN THE MORNING 3 CAPSULE IN THE AFTERNOON AND 3 CAPSULE AT NIGHT 270 capsule 3   losartan (COZAAR) 50 MG tablet Take 1 tablet (50 mg total) by mouth daily. 90 tablet 1   Multiple Vitamin (MULTIVITAMIN) capsule Take 1 capsule by mouth daily.     sodium chloride (OCEAN) 0.65 % SOLN nasal spray Place 1 spray into both nostrils daily as needed for congestion.     spironolactone (ALDACTONE) 25 MG tablet Take 1 tablet (25 mg total) by mouth daily. 15 tablet 3   traMADol (ULTRAM) 50 MG tablet Take  1 tablet (50 mg total) by mouth every 6 (six) hours as needed. 30 tablet 1   No current facility-administered medications for this visit.     Allergies:  Allergies  Allergen Reactions   Jardiance [Empagliflozin] Other (See Comments)    UTI like symptoms      Physical Exam:        Blood pressure 133/72, pulse 65, temperature 98.2 F (36.8 C), temperature source Temporal, resp. rate 15, weight 200 lb 8 oz (90.9 kg), SpO2 99 %.           ECOG: 1   General  appearance: Comfortable appearing without any discomfort Head: Normocephalic without any trauma Oropharynx: Mucous membranes are moist and pink without any thrush or ulcers. Eyes: Pupils are equal and round reactive to light. Lymph nodes: No cervical, supraclavicular, inguinal or axillary lymphadenopathy.   Heart:regular rate and rhythm.  S1 and S2 without leg edema. Lung: Clear without any rhonchi or wheezes.  No dullness to percussion. Abdomin: Soft, nontender, nondistended with good bowel sounds.  No hepatosplenomegaly. Musculoskeletal: No joint deformity or effusion.  Full range of motion noted. Neurological: No deficits noted on motor, sensory and deep tendon reflex exam. Skin: No petechial rash or dryness.  Appeared moist.              Lab Results: Lab Results  Component Value Date   WBC 6.9 10/02/2021   HGB 12.4 (L) 10/02/2021   HCT 35.9 (L) 10/02/2021   MCV 95.2 10/02/2021   PLT 212 10/02/2021     Chemistry      Component Value Date/Time   NA 139 10/02/2021 1055   NA 141 09/27/2021 0815   NA 137 02/06/2017 0828   K 3.9 10/02/2021 1055   K 4.0 02/06/2017 0828   CL 107 10/02/2021 1055   CO2 28 10/02/2021 1055   CO2 24 02/06/2017 0828   BUN 15 10/02/2021 1055   BUN 16 09/27/2021 0815   BUN 14.9 02/06/2017 0828   CREATININE 0.83 10/02/2021 1055   CREATININE 0.9 02/06/2017 0828      Component Value Date/Time   CALCIUM 9.1 10/02/2021 1055   CALCIUM 9.1 02/06/2017 0828   ALKPHOS 23 (L) 10/02/2021 1055   ALKPHOS 31 (L) 02/06/2017 0828   AST 14 (L) 10/02/2021 1055   AST 32 02/06/2017 0828   ALT 12 10/02/2021 1055   ALT 43 02/06/2017 0828   BILITOT 0.4 10/02/2021 1055   BILITOT 0.42 02/06/2017 0828      Latest Reference Range & Units 05/29/21 11:38 07/10/21 11:29 08/07/21 13:04  M Protein SerPl Elph-Mcnc Not Observed g/dL 0.1 (H) (C) 0.2 (H) (C) 0.2 (H) (C)  IFE 1  Comment ! (C) Comment ! (C) Comment ! (C)  Globulin, Total 2.2 - 3.9 g/dL 2.1 (L) (C) 2.1  (L) (C) 2.4 (C)  B-Globulin SerPl Elph-Mcnc 0.7 - 1.3 g/dL 0.7 (C) 0.7 (C) 1.0 (C)  IgG (Immunoglobin G), Serum 603 - 1,613 mg/dL 663 591 (L) 629  IgM (Immunoglobulin M), Srm 15 - 143 mg/dL 5 (L) 7 (L) 7 (L)  IgA 61 - 437 mg/dL 32 (L) 32 (L) 31 (L)    Latest Reference Range & Units 04/17/21 10:52 05/29/21 11:37 07/10/21 11:28 08/07/21 13:03  Kappa free light chain 3.3 - 19.4 mg/L 7.0 7.6 8.3 7.1  Lambda free light chains 5.7 - 26.3 mg/L 4.0 (L) 4.0 (L) 4.3 (L) 3.8 (L)  Kappa, lambda light chain ratio 0.26 - 1.65  1.75 (H) 1.90 (H)  1.93 (H) 1.87 (H)  (L): Data is abnormally low (H): Data is abnormally high   Impression and Plan:  71 year old man with:   1.  Kappa light chain multiple myeloma diagnosed in 2018 with relapsed disease in 2022.    The natural course of this disease was reviewed at this time and treatment choices were discussed.  We will continue on daratumumab with dexamethasone per protocol currently receiving it every 2 weeks.  Different salvage therapy options were reviewed at this time which will be instituted upon relapse.  He is agreeable to continue at this time.  Complications that include infusion related issues as well as hyperglycemia among others were reiterated.    2.  Bone health: Zometa is currently on hold and will be reinstituted if you develop bone disease.   3.  Neuropathy: No major changes at this time currently on gabapentin.  His neuropathy remains stable.  4.  VZV prophylaxis: No reactivation noted on acyclovir.  5.  Anemia: Hemoglobin is close to normal range.  His anemia is related to plasma cell disorder.  6.  Cardiomyopathy: Repeat echocardiogram showed EF from 30 to 35% without any signs or symptoms of heart failure.  Continue to follow with cardiology.  7. Follow-up: I he will return in 2 weeks to complete current cycle in 4 weeks for MD follow-up.  30 minutes were dedicated to this visit.  The time was spent on reviewing laboratory data,  disease status update and outlining future plan of care discussion.   Zola Button, MD 8/30/202311:37 AM

## 2021-10-03 ENCOUNTER — Other Ambulatory Visit: Payer: Self-pay

## 2021-10-05 ENCOUNTER — Other Ambulatory Visit: Payer: Self-pay

## 2021-10-08 ENCOUNTER — Encounter: Payer: Self-pay | Admitting: Cardiovascular Disease

## 2021-10-08 ENCOUNTER — Ambulatory Visit: Payer: Medicare Other | Attending: Cardiovascular Disease | Admitting: Cardiovascular Disease

## 2021-10-08 DIAGNOSIS — I340 Nonrheumatic mitral (valve) insufficiency: Secondary | ICD-10-CM | POA: Insufficient documentation

## 2021-10-08 DIAGNOSIS — I5020 Unspecified systolic (congestive) heart failure: Secondary | ICD-10-CM | POA: Diagnosis not present

## 2021-10-08 MED ORDER — CARVEDILOL 12.5 MG PO TABS: 12.5000 mg | ORAL_TABLET | Freq: Two times a day (BID) | ORAL | 3 refills | Status: DC

## 2021-10-08 NOTE — Assessment & Plan Note (Signed)
Now only mild by 2D echo performed 8//23.  This suggests that his severe MR seen on his previous echo was functional.

## 2021-10-08 NOTE — Assessment & Plan Note (Signed)
Alexander Saunders presents back today for follow-up of his nonischemic cardiomyopathy presumably related to chemotherapy for his multiple myeloma.  He is on guideline directed for medical therapy which is being titrated as an outpatient by our Pharm.D.'s.  This most recent echo showed mild improvement in his EF from 25 to 30% up to 30 to 35%.  His MR now appears mild as compared to severe suggesting that it was functional.  He has no physical limitations and specifically denies chest pain or shortness of breath.  I am going to uptitrate his carvedilol from 9.375 mg p.o. twice daily to 12.5 mg p.o. twice daily.  We will recheck a 2D echo in 6 months.

## 2021-10-08 NOTE — Progress Notes (Signed)
10/08/2021 Alexander Saunders   26-Mar-1950  [id number]  Primary Physician Benito Mccreedy, MD Primary Cardiologist: Lorretta Harp MD Lupe Carney, [place]  HPI:  Alexander Saunders is a 71 y.o.  fit appearing married African-American male father of 2 with no grandchildren who was referred by [facility] because of an abnormal EKG. 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  I last saw him in the office 06/07/2021. [name]  He has no cardiac risk factors. He saw Dr. Rex Kras remotely because of an abnormal EKG and has had functional testing. Unfortunately has multiple myeloma and is scheduled to have a stem cell transplant. A 2-D echo performed 04/15/17 was normal except for mild concentric LVH. He exercises regularly and walks 2 miles a day with his wife. Does have peripheral neuropathy probably from chemotherapy.  He had a stem cell transplant but has relapsed within the last year and is getting chemotherapy.  He noticed resting tachycardia when his hemoglobin dropped in the 6 range along with palpitations which have resolved since his hemoglobin is back in the 11 range.  He was drinking several cups of coffee a day as well, but has since cut this in half.  He denies chest pain or shortness of breath.     I obtained a 2D echocardiogram on 04/29/2021 that showed moderately severe LV dysfunction, LV dilatation, moderate to severe MR and a small pericardial effusion.  It was thought that his MR was functional.  He did see Dr. Glenford Bayley that day who thought that his LV dysfunction may have been related to his chemotherapy for his recurrent multiple myeloma.  He began him on guideline directed optimal medical therapy for LV dysfunction.  A repeat echo performed 05/13/21 showed only mild MR suggesting that this was a functional.  He is completely asymptomatic and actually very active exercising 4 to 6 days a week without symptoms.  Since I saw him 3  months ago his medications have been titrated as an outpatient by our Pharm.D.'s.  His 2D echo did show slight improvement in his EF from 25 to 30% up to 30 to 35% with only mild MR.  He is completely asymptomatic.   Current Meds  Medication Sig   acyclovir (ZOVIRAX) 400 MG tablet Take 1 tablet (400 mg total) by mouth daily.   aspirin EC 81 MG tablet Take 81 mg by mouth daily.   B Complex Vitamins (B-COMPLEX/B-12) TABS Take 1 tablet by mouth daily.   bisacodyl (DULCOLAX) 5 MG EC tablet Take 5 mg by mouth daily as needed for moderate constipation.   carboxymethylcellulose (REFRESH PLUS) 0.5 % SOLN Place 1 drop into both eyes 3 (three) times daily as needed (dry eyes).   carvedilol (COREG) 6.25 MG tablet Take 1.5 tablets (9.375 mg total) by mouth 2 (two) times daily.   cholecalciferol (VITAMIN D3) 25 MCG (1000 UNIT) tablet Take 1,000 Units by mouth daily.   diphenhydramine-acetaminophen (TYLENOL PM EXTRA STRENGTH) 25-500 MG TABS tablet Take 1 tablet by mouth at bedtime as needed (pain).   gabapentin (NEURONTIN) 300 MG capsule TAKE 3 CAPSULE BY MOUTH THREE TIMES DAILY. 3 CAPSULE IN THE MORNING 3 CAPSULE IN THE AFTERNOON AND 3 CAPSULE AT NIGHT   losartan (COZAAR) 50 MG tablet Take 1 tablet (50 mg total) by mouth daily.   Multiple Vitamin (MULTIVITAMIN) capsule Take 1 capsule by mouth daily.   sodium chloride (OCEAN) 0.65 % SOLN nasal  spray Place 1 spray into both nostrils daily as needed for congestion.   spironolactone (ALDACTONE) 25 MG tablet Take 1 tablet (25 mg total) by mouth daily.   traMADol (ULTRAM) 50 MG tablet Take 1 tablet (50 mg total) by mouth every 6 (six) hours as needed.     Allergies  Allergen Reactions   Jardiance [Empagliflozin] Other (See Comments)    UTI like symptoms    Social History   Socioeconomic History   Marital status: Married    Spouse name: Not on file   Number of children: Not on file   Years of education: Not on file   Highest education level: Not on  file  Occupational History   Not on file  Tobacco Use   Smoking status: Never   Smokeless tobacco: Never  Vaping Use   Vaping Use: Never used  Substance and Sexual Activity   Alcohol use: Yes    Comment: occ   Drug use: Never   Sexual activity: Not on file  Other Topics Concern   Not on file  Social History Narrative   He worked as a Paramedic level of education:  college   He is back in college at [employer] studying exercise physiology.       Right handed      Single story home   Social Determinants of Health   Financial Resource Strain: Not on file  Food Insecurity: No Food Insecurity (09/11/2021)   Hunger Vital Sign    Worried About Running Out of Food in the Last Year: Never true    Ran Out of Food in the Last Year: Never true  Transportation Needs: No Transportation Needs (09/11/2021)   PRAPARE - Hydrologist (Medical): No    Lack of Transportation (Non-Medical): No  Physical Activity: Not on file  Stress: Not on file  Social Connections: Not on file  Intimate Partner Violence: Not on file     Review of Systems: General: negative for chills, fever, night sweats or weight changes.  Cardiovascular: negative for chest pain, dyspnea on exertion, edema, orthopnea, palpitations, paroxysmal nocturnal dyspnea or shortness of breath Dermatological: negative for rash Respiratory: negative for cough or wheezing Urologic: negative for hematuria Abdominal: negative for nausea, vomiting, diarrhea, bright red blood per rectum, melena, or hematemesis Neurologic: negative for visual changes, syncope, or dizziness All other systems reviewed and are otherwise negative except as noted above.    Blood pressure 110/64, pulse 71, height 6' (1.829 m), weight 199 lb (90.3 kg), SpO2 97 %.  General appearance: alert and no distress Neck: no adenopathy, no carotid bruit, no JVD, supple, symmetrical, trachea midline, and thyroid not enlarged,  symmetric, no tenderness/mass/nodules Lungs: clear to auscultation bilaterally Heart: regular rate and rhythm, S1, S2 normal, no murmur, click, rub or gallop Extremities: extremities normal, atraumatic, no cyanosis or edema Pulses: 2+ and symmetric Skin: Skin color, texture, turgor normal. No rashes or lesions Neurologic: Grossly normal  EKG sinus rhythm at 71 with small inferior Q waves and inferolateral T wave inversion.  I personally reviewed this EKG.  There did appear to be voltage criteria for LVH.  This is unchanged from his prior EKGs.  ASSESSMENT AND PLAN:   HFrEF (heart failure with reduced ejection fraction) [facility]) Mr. Gillentine presents back today for follow-up of his nonischemic cardiomyopathy presumably related to chemotherapy for his multiple myeloma.  He is on guideline directed for medical therapy which is being titrated as an  outpatient by our Pharm.D.'s.  This most recent echo showed mild improvement in his EF from 25 to 30% up to 30 to 35%.  His MR now appears mild as compared to severe suggesting that it was functional.  He has no physical limitations and specifically denies chest pain or shortness of breath.  I am going to uptitrate his carvedilol from 9.375 mg p.o. twice daily to 12.5 mg p.o. twice daily.  We will recheck a 2D echo in 6 months.  Nonrheumatic mitral valve regurgitation Now only mild by 2D echo performed 8//23.  This suggests that his severe MR seen on his previous echo was functional.     Lorretta Harp MD [facility] 10/08/2021 8:39 AM

## 2021-10-08 NOTE — Patient Instructions (Signed)
Medication Instructions:    -Increase carvedilol (coreg) to 12.'5mg'$  twice daily.   *If you need a refill on your cardiac medications before your next appointment, please call your pharmacy*   Testing/Procedures: Your physician has requested that you have an echocardiogram. Echocardiography is a painless test that uses sound waves to create images of your heart. It provides your doctor with information about the size and shape of your heart and how well your heart's chambers and valves are working. This procedure takes approximately one hour. There are no restrictions for this procedure. To be done in August 2024. This procedure will be done at [address]    Follow-Up: At [facility], you and your health needs are our priority.  As part of our continuing mission to provide you with exceptional heart care, we have created designated Provider Care Teams.  These Care Teams include your primary Cardiologist (physician) and Advanced Practice Providers (APPs -  Physician Assistants and Nurse Practitioners) who all work together to provide you with the care you need, when you need it.  We recommend signing up for the patient portal called "MyChart".  Sign up information is provided on this After Visit Summary.  MyChart is used to connect with patients for Virtual Visits (Telemedicine).  Patients are able to view lab/test results, encounter notes, upcoming appointments, etc.  Non-urgent messages can be sent to your provider as well.   To learn more about what you can do with MyChart, go to [email or web address].    Your next appointment:   6 month(s)  The format for your next appointment:   In Person  Provider:   Fabian Sharp, PA-C, Sande Rives, PA-C, Caron Presume, PA-C, Jory Sims, DNP, ANP, Almyra Deforest, PA-C, or Diona Browner, NP       Then, Quay Burow, MD will plan to see you again in 12 month(s).

## 2021-10-09 ENCOUNTER — Telehealth: Payer: Self-pay | Admitting: Oncology

## 2021-10-09 NOTE — Telephone Encounter (Signed)
Called patient regarding upcoming September appointments, left a voicemail. 

## 2021-10-10 ENCOUNTER — Other Ambulatory Visit: Payer: Self-pay | Admitting: Oncology

## 2021-10-10 DIAGNOSIS — C9000 Multiple myeloma not having achieved remission: Secondary | ICD-10-CM

## 2021-10-15 MED FILL — Dexamethasone Sodium Phosphate Inj 100 MG/10ML: INTRAMUSCULAR | Qty: 2 | Status: AC

## 2021-10-16 ENCOUNTER — Inpatient Hospital Stay: Payer: Medicare Other

## 2021-10-16 ENCOUNTER — Inpatient Hospital Stay: Payer: Medicare Other | Attending: Oncology

## 2021-10-16 VITALS — BP 127/72 | HR 62 | Temp 98.0°F | Resp 16 | Wt 200.5 lb

## 2021-10-16 DIAGNOSIS — G629 Polyneuropathy, unspecified: Secondary | ICD-10-CM | POA: Diagnosis not present

## 2021-10-16 DIAGNOSIS — C9000 Multiple myeloma not having achieved remission: Secondary | ICD-10-CM

## 2021-10-16 DIAGNOSIS — I429 Cardiomyopathy, unspecified: Secondary | ICD-10-CM | POA: Diagnosis not present

## 2021-10-16 DIAGNOSIS — Z5112 Encounter for antineoplastic immunotherapy: Secondary | ICD-10-CM | POA: Diagnosis not present

## 2021-10-16 DIAGNOSIS — C9002 Multiple myeloma in relapse: Secondary | ICD-10-CM | POA: Diagnosis not present

## 2021-10-16 DIAGNOSIS — D649 Anemia, unspecified: Secondary | ICD-10-CM | POA: Insufficient documentation

## 2021-10-16 LAB — CMP (CANCER CENTER ONLY)
ALT: 11 U/L (ref 0–44)
AST: 14 U/L — ABNORMAL LOW (ref 15–41)
Albumin: 4.3 g/dL (ref 3.5–5.0)
Alkaline Phosphatase: 24 U/L — ABNORMAL LOW (ref 38–126)
Anion gap: 3 — ABNORMAL LOW (ref 5–15)
BUN: 15 mg/dL (ref 8–23)
CO2: 29 mmol/L (ref 22–32)
Calcium: 9.7 mg/dL (ref 8.9–10.3)
Chloride: 107 mmol/L (ref 98–111)
Creatinine: 0.86 mg/dL (ref 0.61–1.24)
GFR, Estimated: >60 mL/min (ref >60)
Glucose, Bld: 108 mg/dL — ABNORMAL HIGH (ref 70–99)
Potassium: 4.2 mmol/L (ref 3.5–5.1)
Sodium: 139 mmol/L (ref 135–145)
Total Bilirubin: 0.5 mg/dL (ref 0.3–1.2)
Total Protein: 6.1 g/dL — ABNORMAL LOW (ref 6.5–8.1)

## 2021-10-16 LAB — CBC WITH DIFFERENTIAL (CANCER CENTER ONLY)
Abs Immature Granulocytes: 0.04 10*3/uL (ref 0.00–0.07)
Basophils Absolute: 0 10*3/uL (ref 0.0–0.1)
Basophils Relative: 0 %
Eosinophils Absolute: 0.3 10*3/uL (ref 0.0–0.5)
Eosinophils Relative: 4 %
HCT: 36.9 % — ABNORMAL LOW (ref 39.0–52.0)
Hemoglobin: 12.7 g/dL — ABNORMAL LOW (ref 13.0–17.0)
Immature Granulocytes: 1 %
Lymphocytes Relative: 9 %
Lymphs Abs: 0.7 10*3/uL (ref 0.7–4.0)
MCH: 33.1 pg (ref 26.0–34.0)
MCHC: 34.4 g/dL (ref 30.0–36.0)
MCV: 96.1 fL (ref 80.0–100.0)
Monocytes Absolute: 0.9 10*3/uL (ref 0.1–1.0)
Monocytes Relative: 12 %
Neutro Abs: 5.8 10*3/uL (ref 1.7–7.7)
Neutrophils Relative %: 74 %
Platelet Count: 252 10*3/uL (ref 150–400)
RBC: 3.84 MIL/uL — ABNORMAL LOW (ref 4.22–5.81)
RDW: 14 % (ref 11.5–15.5)
WBC Count: 7.7 10*3/uL (ref 4.0–10.5)
nRBC: 0 % (ref 0.0–0.2)

## 2021-10-16 MED ORDER — DARATUMUMAB-HYALURONIDASE-FIHJ 1800-30000 MG-UT/15ML ~~LOC~~ SOLN
1800.0000 mg | Freq: Once | SUBCUTANEOUS | Status: AC
  Administered 2021-10-16: 1800 mg via SUBCUTANEOUS
  Filled 2021-10-16: qty 15

## 2021-10-16 MED ORDER — DEXAMETHASONE 4 MG PO TABS
20.0000 mg | ORAL_TABLET | Freq: Once | ORAL | Status: AC
  Administered 2021-10-16: 20 mg via ORAL
  Filled 2021-10-16: qty 5

## 2021-10-16 MED ORDER — DIPHENHYDRAMINE HCL 25 MG PO CAPS
50.0000 mg | ORAL_CAPSULE | Freq: Once | ORAL | Status: AC
  Administered 2021-10-16: 50 mg via ORAL
  Filled 2021-10-16: qty 2

## 2021-10-16 MED ORDER — ACETAMINOPHEN 325 MG PO TABS
650.0000 mg | ORAL_TABLET | Freq: Once | ORAL | Status: AC
  Administered 2021-10-16: 650 mg via ORAL
  Filled 2021-10-16: qty 2

## 2021-10-17 ENCOUNTER — Ambulatory Visit: Payer: Self-pay

## 2021-10-17 ENCOUNTER — Encounter: Payer: Self-pay | Admitting: Oncology

## 2021-10-17 LAB — KAPPA/LAMBDA LIGHT CHAINS
Kappa free light chain: 7.1 mg/L (ref 3.3–19.4)
Kappa, lambda light chain ratio: 1.65 (ref 0.26–1.65)
Lambda free light chains: 4.3 mg/L — ABNORMAL LOW (ref 5.7–26.3)

## 2021-10-17 NOTE — Patient Outreach (Signed)
  Care Coordination   Follow Up Visit Note   10/17/2021 Name: Alexander Saunders MRN: [id number] DOB: September 14, 1950  Alexander Saunders is a 71 y.o. year old male who sees [name], [name], MD for primary care. I spoke with  Alexander Saunders by phone today.  What matters to the patients health and wellness today?  Mr. Baba reports he is doing well. He reports he has started working some. He reports recent visit with cardiologist showed improvement. He reports he continues to follow up with providers as scheduled. Mr. Stracke states he needs to schedule an annual exam with primary care provider in November. Denies any issues or concerns at this time.   Goals Addressed             This Visit's Progress    improve heart health       Care Coordination Interventions: Discussed annual  visit with PCP and encouraged patient to schedule. Advised patient to contact provider regarding health questions/concerns as needed Reviewed upcoming/scheduled appointments          SDOH assessments and interventions completed:  No    Care Coordination Interventions Activated:  Yes  Care Coordination Interventions:  Yes, provided   Follow up plan: Follow up call scheduled for 01/14/22    Encounter Outcome:  Pt. Visit Completed   Thea Silversmith, RN, MSN, BSN, [place] Coordinator [phone]

## 2021-10-18 ENCOUNTER — Other Ambulatory Visit: Payer: Self-pay

## 2021-10-23 LAB — MULTIPLE MYELOMA PANEL, SERUM
Albumin SerPl Elph-Mcnc: 3.9 g/dL (ref 2.9–4.4)
Albumin/Glob SerPl: 1.8 — ABNORMAL HIGH (ref 0.7–1.7)
Alpha 1: 0.2 g/dL (ref 0.0–0.4)
Alpha2 Glob SerPl Elph-Mcnc: 0.6 g/dL (ref 0.4–1.0)
B-Globulin SerPl Elph-Mcnc: 0.9 g/dL (ref 0.7–1.3)
Gamma Glob SerPl Elph-Mcnc: 0.5 g/dL (ref 0.4–1.8)
Globulin, Total: 2.2 g/dL (ref 2.2–3.9)
IgA: 29 mg/dL — ABNORMAL LOW (ref 61–437)
IgG (Immunoglobin G), Serum: 592 mg/dL — ABNORMAL LOW (ref 603–1613)
IgM (Immunoglobulin M), Srm: 9 mg/dL — ABNORMAL LOW (ref 15–143)
M Protein SerPl Elph-Mcnc: 0.2 g/dL — ABNORMAL HIGH
Total Protein ELP: 6.1 g/dL (ref 6.0–8.5)

## 2021-10-29 DIAGNOSIS — I5022 Chronic systolic (congestive) heart failure: Secondary | ICD-10-CM | POA: Diagnosis not present

## 2021-10-29 DIAGNOSIS — I10 Essential (primary) hypertension: Secondary | ICD-10-CM | POA: Diagnosis not present

## 2021-10-29 DIAGNOSIS — G620 Drug-induced polyneuropathy: Secondary | ICD-10-CM | POA: Diagnosis not present

## 2021-10-29 DIAGNOSIS — Z23 Encounter for immunization: Secondary | ICD-10-CM | POA: Diagnosis not present

## 2021-10-30 ENCOUNTER — Inpatient Hospital Stay (HOSPITAL_BASED_OUTPATIENT_CLINIC_OR_DEPARTMENT_OTHER): Payer: Medicare Other | Admitting: Oncology

## 2021-10-30 ENCOUNTER — Inpatient Hospital Stay: Payer: Medicare Other

## 2021-10-30 ENCOUNTER — Ambulatory Visit: Payer: TRICARE For Life (TFL)

## 2021-10-30 ENCOUNTER — Other Ambulatory Visit: Payer: TRICARE For Life (TFL)

## 2021-10-30 VITALS — BP 120/66 | HR 67 | Temp 98.1°F | Resp 16 | Ht 72.0 in | Wt 199.6 lb

## 2021-10-30 DIAGNOSIS — G629 Polyneuropathy, unspecified: Secondary | ICD-10-CM | POA: Diagnosis not present

## 2021-10-30 DIAGNOSIS — C9002 Multiple myeloma in relapse: Secondary | ICD-10-CM | POA: Diagnosis not present

## 2021-10-30 DIAGNOSIS — C9000 Multiple myeloma not having achieved remission: Secondary | ICD-10-CM

## 2021-10-30 DIAGNOSIS — D649 Anemia, unspecified: Secondary | ICD-10-CM | POA: Diagnosis not present

## 2021-10-30 DIAGNOSIS — I429 Cardiomyopathy, unspecified: Secondary | ICD-10-CM | POA: Diagnosis not present

## 2021-10-30 DIAGNOSIS — Z5112 Encounter for antineoplastic immunotherapy: Secondary | ICD-10-CM | POA: Diagnosis not present

## 2021-10-30 LAB — CMP (CANCER CENTER ONLY)
ALT: 10 U/L (ref 0–44)
AST: 12 U/L — ABNORMAL LOW (ref 15–41)
Albumin: 4.4 g/dL (ref 3.5–5.0)
Alkaline Phosphatase: 24 U/L — ABNORMAL LOW (ref 38–126)
Anion gap: 4 — ABNORMAL LOW (ref 5–15)
BUN: 16 mg/dL (ref 8–23)
CO2: 29 mmol/L (ref 22–32)
Calcium: 9.4 mg/dL (ref 8.9–10.3)
Chloride: 106 mmol/L (ref 98–111)
Creatinine: 0.99 mg/dL (ref 0.61–1.24)
GFR, Estimated: >60 mL/min (ref >60)
Glucose, Bld: 109 mg/dL — ABNORMAL HIGH (ref 70–99)
Potassium: 4.1 mmol/L (ref 3.5–5.1)
Sodium: 139 mmol/L (ref 135–145)
Total Bilirubin: 0.8 mg/dL (ref 0.3–1.2)
Total Protein: 6.6 g/dL (ref 6.5–8.1)

## 2021-10-30 LAB — CBC WITH DIFFERENTIAL (CANCER CENTER ONLY)
Abs Immature Granulocytes: 0.03 10*3/uL (ref 0.00–0.07)
Basophils Absolute: 0 10*3/uL (ref 0.0–0.1)
Basophils Relative: 0 %
Eosinophils Absolute: 0.2 10*3/uL (ref 0.0–0.5)
Eosinophils Relative: 3 %
HCT: 38 % — ABNORMAL LOW (ref 39.0–52.0)
Hemoglobin: 13.2 g/dL (ref 13.0–17.0)
Immature Granulocytes: 0 %
Lymphocytes Relative: 7 %
Lymphs Abs: 0.6 10*3/uL — ABNORMAL LOW (ref 0.7–4.0)
MCH: 33.7 pg (ref 26.0–34.0)
MCHC: 34.7 g/dL (ref 30.0–36.0)
MCV: 96.9 fL (ref 80.0–100.0)
Monocytes Absolute: 1 10*3/uL (ref 0.1–1.0)
Monocytes Relative: 12 %
Neutro Abs: 6.6 10*3/uL (ref 1.7–7.7)
Neutrophils Relative %: 78 %
Platelet Count: 231 10*3/uL (ref 150–400)
RBC: 3.92 MIL/uL — ABNORMAL LOW (ref 4.22–5.81)
RDW: 13.8 % (ref 11.5–15.5)
WBC Count: 8.5 10*3/uL (ref 4.0–10.5)
nRBC: 0 % (ref 0.0–0.2)

## 2021-10-30 MED ORDER — DEXAMETHASONE 4 MG PO TABS
20.0000 mg | ORAL_TABLET | Freq: Once | ORAL | Status: AC
  Administered 2021-10-30: 20 mg via ORAL
  Filled 2021-10-30: qty 5

## 2021-10-30 MED ORDER — DARATUMUMAB-HYALURONIDASE-FIHJ 1800-30000 MG-UT/15ML ~~LOC~~ SOLN
1800.0000 mg | Freq: Once | SUBCUTANEOUS | Status: AC
  Administered 2021-10-30: 1800 mg via SUBCUTANEOUS
  Filled 2021-10-30: qty 15

## 2021-10-30 MED ORDER — DIPHENHYDRAMINE HCL 25 MG PO CAPS
50.0000 mg | ORAL_CAPSULE | Freq: Once | ORAL | Status: AC
  Administered 2021-10-30: 50 mg via ORAL
  Filled 2021-10-30: qty 2

## 2021-10-30 MED ORDER — ACETAMINOPHEN 325 MG PO TABS
650.0000 mg | ORAL_TABLET | Freq: Once | ORAL | Status: AC
  Administered 2021-10-30: 650 mg via ORAL
  Filled 2021-10-30: qty 2

## 2021-10-30 NOTE — Progress Notes (Signed)
Hematology and Oncology Follow Up Visit  Alexander Saunders [id number] 01-08-1951 71 y.o. 10/30/2021 11:40 AM [name], [name], MDShadad, Mathis Dad, MD   Principle Diagnosis: 51 year old man with multiple myeloma diagnosed in 2018.  He was found to have kappa free light chain and plasmacytoma.  He developed relapsed disease in December 2022 with 50% plasma cell involvement.   Prior Therapy:  He is status post surgical decompression of an epidural mass on 09/04/2016 and the pathology showed a plasma cell neoplasm.  He is S/P adjuvant radiation therapy to the thoracic spine to be completed on 10/14/2016.  Velcade 1.5 mg/m weekly with dexamethasone 20 mg and Cytoxan 600 mg po. Therapy started on 10/24/2016.   High-dose chemotherapy followed by stem cell transplant: Melphalan 200 mg/m2 completed on 06/17/17 followed by autologous stem cell rescue 06/18/17.  He achieved complete response.   Zometa 4 mg every 3 months started in February 2020.  Last treatment given in January 2022.   Revlimid 10 mg daily for 21 days out of a 28-day started in February 2020.  Therapy discontinued in November 2022 because of worsening anemia.  Carfilzomib 20 mg/m weekly started on February 19, 2021.  Therapy stopped in March 2023 for presumed cardiomyopathy complication.   Daratumumab 1800 mg subcutaneous injection weekly started on February 19, 2021. Dexamethasone weekly started on February 19, 2021.  Current therapy:    Daratumumab 1800 mg subcutaneous injections per protocol currently every 14 days with Dexamethasone weekly 20 mg.    Interim History:  Alexander Saunders is here for a follow-up visit.  Since the last visit, he continues to tolerate daratumumab without any complaints.  He denies any nausea, vomiting or abdominal pain.  He denies any hospitalizations or illnesses.  His performance status and quality of life remains unchanged.  He denied any worsening neuropathy or back pain.  Continues to be  active.     Medications: Updated on review. Current Outpatient Medications  Medication Sig Dispense Refill   acyclovir (ZOVIRAX) 400 MG tablet Take 1 tablet (400 mg total) by mouth daily. 90 tablet 3   aspirin EC 81 MG tablet Take 81 mg by mouth daily.     B Complex Vitamins (B-COMPLEX/B-12) TABS Take 1 tablet by mouth daily.     bisacodyl (DULCOLAX) 5 MG EC tablet Take 5 mg by mouth daily as needed for moderate constipation.     carboxymethylcellulose (REFRESH PLUS) 0.5 % SOLN Place 1 drop into both eyes 3 (three) times daily as needed (dry eyes).     carvedilol (COREG) 12.5 MG tablet Take 1 tablet (12.5 mg total) by mouth 2 (two) times daily. 180 tablet 3   cholecalciferol (VITAMIN D3) 25 MCG (1000 UNIT) tablet Take 1,000 Units by mouth daily.     diphenhydramine-acetaminophen (TYLENOL PM EXTRA STRENGTH) 25-500 MG TABS tablet Take 1 tablet by mouth at bedtime as needed (pain).     gabapentin (NEURONTIN) 300 MG capsule TAKE 3 CAPSULE BY MOUTH THREE TIMES DAILY. 3 CAPSULE IN THE MORNING 3 CAPSULE IN THE AFTERNOON AND 3 CAPSULE AT NIGHT 270 capsule 3   losartan (COZAAR) 50 MG tablet Take 1 tablet (50 mg total) by mouth daily. 90 tablet 1   Multiple Vitamin (MULTIVITAMIN) capsule Take 1 capsule by mouth daily.     sildenafil (VIAGRA) 50 MG tablet daily as needed. (Patient not taking: Reported on 10/08/2021)     sodium chloride (OCEAN) 0.65 % SOLN nasal spray Place 1 spray into both nostrils daily as needed for congestion.  spironolactone (ALDACTONE) 25 MG tablet Take 1 tablet (25 mg total) by mouth daily. 15 tablet 3   traMADol (ULTRAM) 50 MG tablet Take 1 tablet (50 mg total) by mouth every 6 (six) hours as needed. 30 tablet 1   No current facility-administered medications for this visit.     Allergies:  Allergies  Allergen Reactions   Jardiance [Empagliflozin] Other (See Comments)    UTI like symptoms      Physical Exam:        Blood pressure 120/66, pulse 67,  temperature 98.1 F (36.7 C), temperature source Temporal, resp. rate 16, height 6' (1.829 m), weight 199 lb 9.6 oz (90.5 kg), SpO2 99 %.           ECOG: 1     General appearance: Alert, awake without any distress. Head: Atraumatic without abnormalities Oropharynx: Without any thrush or ulcers. Eyes: No scleral icterus. Lymph nodes: No lymphadenopathy noted in the cervical, supraclavicular, or axillary nodes Heart:regular rate and rhythm, without any murmurs or gallops.   Lung: Clear to auscultation without any rhonchi, wheezes or dullness to percussion. Abdomin: Soft, nontender without any shifting dullness or ascites. Musculoskeletal: No clubbing or cyanosis. Neurological: No motor or sensory deficits. Skin: No rashes or lesions.               Lab Results: Lab Results  Component Value Date   WBC 8.5 10/30/2021   HGB 13.2 10/30/2021   HCT 38.0 (L) 10/30/2021   MCV 96.9 10/30/2021   PLT 231 10/30/2021     Chemistry      Component Value Date/Time   NA 139 10/16/2021 1252   NA 141 09/27/2021 0815   NA 137 02/06/2017 0828   K 4.2 10/16/2021 1252   K 4.0 02/06/2017 0828   CL 107 10/16/2021 1252   CO2 29 10/16/2021 1252   CO2 24 02/06/2017 0828   BUN 15 10/16/2021 1252   BUN 16 09/27/2021 0815   BUN 14.9 02/06/2017 0828   CREATININE 0.86 10/16/2021 1252   CREATININE 0.9 02/06/2017 0828      Component Value Date/Time   CALCIUM 9.7 10/16/2021 1252   CALCIUM 9.1 02/06/2017 0828   ALKPHOS 24 (L) 10/16/2021 1252   ALKPHOS 31 (L) 02/06/2017 0828   AST 14 (L) 10/16/2021 1252   AST 32 02/06/2017 0828   ALT 11 10/16/2021 1252   ALT 43 02/06/2017 0828   BILITOT 0.5 10/16/2021 1252   BILITOT 0.42 02/06/2017 0828       Latest Reference Range & Units 07/10/21 11:29 08/07/21 13:04 10/16/21 12:52  M Protein SerPl Elph-Mcnc Not Observed g/dL 0.2 (H) (C) 0.2 (H) (C) 0.2 (H) (C)  IFE 1  Comment ! (C) Comment ! (C) Comment ! (C)  Globulin, Total 2.2 - 3.9  g/dL 2.1 (L) (C) 2.4 (C) 2.2 (C)  B-Globulin SerPl Elph-Mcnc 0.7 - 1.3 g/dL 0.7 (C) 1.0 (C) 0.9 (C)  IgG (Immunoglobin G), Serum 603 - 1,613 mg/dL 591 (L) 629 592 (L)  IgM (Immunoglobulin M), Srm 15 - 143 mg/dL 7 (L) 7 (L) 9 (L)  IgA 61 - 437 mg/dL 32 (L) 31 (L) 29 (L)  (H): Data is abnormally high !: Data is abnormal (L): Data is abnormally low (C): Corrected     Impression and Plan:  71 year old man with:   1.  Multiple myeloma diagnosed in 2018.  He presented with Kappa light chain and plasmacytoma and had relapsed disease in December 2022.    He is  currently receiving daratumumab every 2 weeks per protocol with dexamethasone with excellent response to therapy.  His M spike is down to 0.2 g/dL with normalization of his kappa to lambda ratio.  Risks and benefits of continuing this treatment were discussed.  Salvage therapy options were also reviewed with him.  He also has follow-up at [facility] to help in the decision making for subsequent salvage therapy options.   2.  Bone health: He has completed 2 years of Zometa in 2020 without any need for retreatment at this time.   3.  Neuropathy: Related to his plasmacytoma.  He is currently on gabapentin.  4.  VZV prophylaxis: His current on acyclovir without any reactivation.  5.  Anemia: Resolved after the treatment of his plasma cell disorder.  Hemoglobin is normal.  6.  Cardiomyopathy: He continues to follow with cardiology with the heart function has stabilized.  7. Follow-up: In 4 weeks for repeat follow-up.  30 minutes were spent on this encounter.  The time was dedicated to reviewing his laboratory data, treatment choices and outlining future plan of care discussion.   Zola Button, MD 9/27/202311:40 AM

## 2021-10-30 NOTE — Patient Instructions (Signed)
[facility] MEDICAL [facility]COLOG[facility]   Discharge Instructions: Thank you for choosing [facility] to provide your oncology and hematology care.   If you have a lab appointment with the Cancer Center, please go directly to the Cancer Center and check in at the registration area.   Wear comfortable clothing and clothing appropriate for easy access to any Portacath or PICC line.   We strive to give you quality time with your provider. You may need to reschedule your appointment if you arrive late (15 or more minutes).  Arriving late affects you and other patients whose appointments are after yours.  Also, if you miss three or more appointments without notifying the office, you may be dismissed from the clinic at the provider's discretion.      For prescription refill requests, have your pharmacy contact our office and allow 72 hours for refills to be completed.    Today you received the following chemotherapy and/or immunotherapy agents: daratumumab-hyaluronidase-fihj      To help prevent nausea and vomiting after your treatment, we encourage you to take your nausea medication as directed.  BELOW ARE SYMPTOMS THAT SHOULD BE REPORTED IMMEDIATELY: *FEVER GREATER THAN 100.4 F (38 C) OR HIGHER *CHILLS OR SWEATING *NAUSEA AND VOMITING THAT IS NOT CONTROLLED WITH YOUR NAUSEA MEDICATION *UNUSUAL SHORTNESS OF BREATH *UNUSUAL BRUISING OR BLEEDING *URINARY PROBLEMS (pain or burning when urinating, or frequent urination) *BOWEL PROBLEMS (unusual diarrhea, constipation, pain near the anus) TENDERNESS IN MOUTH AND THROAT WITH OR WITHOUT PRESENCE OF ULCERS (sore throat, sores in mouth, or a toothache) UNUSUAL RASH, SWELLING OR PAIN  UNUSUAL VAGINAL DISCHARGE OR ITCHING   Items with * indicate a potential emergency and should be followed up as soon as possible or go to the Emergency Department if any problems should occur.  Please show the CHEMOTHERAPY ALERT CARD or IMMUNOTHERAPY  ALERT CARD at check-in to the Emergency Department and triage nurse.  Should you have questions after your visit or need to cancel or reschedule your appointment, please contact [facility]  Dept: [phone]  and follow the prompts.  Office hours are 8:00 a.m. to 4:30 p.m. Monday - Friday. Please note that voicemails left after 4:00 p.m. may not be returned until the following business day.  We are closed weekends and major holidays. You have access to a nurse at all times for urgent questions. Please call the main number to the clinic Dept: [phone] and follow the prompts.   For any non-urgent questions, you may also contact your provider using MyChart. We now offer e-Visits for anyone 18 and older to request care online for non-urgent symptoms. For details visit [email or web address].   Also download the MyChart app! Go to the app store, search "MyChart", open the app, select [facility], and log in with your MyChart username and password.  Masks are optional in the cancer centers. If you would like for your care team to wear a mask while they are taking care of you, please let them know. You may have one support person who is at least 72 years old accompany you for your appointments. 

## 2021-11-02 ENCOUNTER — Other Ambulatory Visit: Payer: Self-pay | Admitting: Cardiovascular Disease

## 2021-11-13 ENCOUNTER — Telehealth: Payer: Self-pay | Admitting: Oncology

## 2021-11-13 NOTE — Telephone Encounter (Signed)
Scheduled per 09/27 work-queue, patient has been called and notified of October appointment.

## 2021-11-27 ENCOUNTER — Inpatient Hospital Stay: Payer: Medicare Other

## 2021-11-27 ENCOUNTER — Inpatient Hospital Stay: Payer: Medicare Other | Attending: Oncology | Admitting: Oncology

## 2021-11-27 VITALS — BP 123/78 | HR 82 | Temp 98.6°F | Wt 203.5 lb

## 2021-11-27 DIAGNOSIS — Z7982 Long term (current) use of aspirin: Secondary | ICD-10-CM | POA: Diagnosis not present

## 2021-11-27 DIAGNOSIS — Z5112 Encounter for antineoplastic immunotherapy: Secondary | ICD-10-CM | POA: Diagnosis not present

## 2021-11-27 DIAGNOSIS — C9000 Multiple myeloma not having achieved remission: Secondary | ICD-10-CM

## 2021-11-27 DIAGNOSIS — Z9484 Stem cells transplant status: Secondary | ICD-10-CM | POA: Insufficient documentation

## 2021-11-27 DIAGNOSIS — G629 Polyneuropathy, unspecified: Secondary | ICD-10-CM | POA: Diagnosis not present

## 2021-11-27 DIAGNOSIS — C9002 Multiple myeloma in relapse: Secondary | ICD-10-CM | POA: Insufficient documentation

## 2021-11-27 DIAGNOSIS — D649 Anemia, unspecified: Secondary | ICD-10-CM | POA: Diagnosis not present

## 2021-11-27 DIAGNOSIS — Z7969 Long term (current) use of other immunomodulators and immunosuppressants: Secondary | ICD-10-CM | POA: Diagnosis not present

## 2021-11-27 DIAGNOSIS — Z79899 Other long term (current) drug therapy: Secondary | ICD-10-CM | POA: Insufficient documentation

## 2021-11-27 DIAGNOSIS — Z79624 Long term (current) use of inhibitors of nucleotide synthesis: Secondary | ICD-10-CM | POA: Diagnosis not present

## 2021-11-27 DIAGNOSIS — I429 Cardiomyopathy, unspecified: Secondary | ICD-10-CM | POA: Insufficient documentation

## 2021-11-27 DIAGNOSIS — Z923 Personal history of irradiation: Secondary | ICD-10-CM | POA: Insufficient documentation

## 2021-11-27 LAB — CBC WITH DIFFERENTIAL (CANCER CENTER ONLY)
Abs Immature Granulocytes: 0.02 10*3/uL (ref 0.00–0.07)
Basophils Absolute: 0 10*3/uL (ref 0.0–0.1)
Basophils Relative: 1 %
Eosinophils Absolute: 0.3 10*3/uL (ref 0.0–0.5)
Eosinophils Relative: 6 %
HCT: 38.7 % — ABNORMAL LOW (ref 39.0–52.0)
Hemoglobin: 13.4 g/dL (ref 13.0–17.0)
Immature Granulocytes: 0 %
Lymphocytes Relative: 14 %
Lymphs Abs: 0.9 10*3/uL (ref 0.7–4.0)
MCH: 33.4 pg (ref 26.0–34.0)
MCHC: 34.6 g/dL (ref 30.0–36.0)
MCV: 96.5 fL (ref 80.0–100.0)
Monocytes Absolute: 0.9 10*3/uL (ref 0.1–1.0)
Monocytes Relative: 15 %
Neutro Abs: 3.8 10*3/uL (ref 1.7–7.7)
Neutrophils Relative %: 64 %
Platelet Count: 246 10*3/uL (ref 150–400)
RBC: 4.01 MIL/uL — ABNORMAL LOW (ref 4.22–5.81)
RDW: 13.5 % (ref 11.5–15.5)
WBC Count: 6 10*3/uL (ref 4.0–10.5)
nRBC: 0 % (ref 0.0–0.2)

## 2021-11-27 LAB — CMP (CANCER CENTER ONLY)
ALT: 17 U/L (ref 0–44)
AST: 15 U/L (ref 15–41)
Albumin: 4.3 g/dL (ref 3.5–5.0)
Alkaline Phosphatase: 27 U/L — ABNORMAL LOW (ref 38–126)
Anion gap: 6 (ref 5–15)
BUN: 14 mg/dL (ref 8–23)
CO2: 30 mmol/L (ref 22–32)
Calcium: 9.8 mg/dL (ref 8.9–10.3)
Chloride: 105 mmol/L (ref 98–111)
Creatinine: 1.01 mg/dL (ref 0.61–1.24)
GFR, Estimated: >60 mL/min (ref >60)
Glucose, Bld: 107 mg/dL — ABNORMAL HIGH (ref 70–99)
Potassium: 4.4 mmol/L (ref 3.5–5.1)
Sodium: 141 mmol/L (ref 135–145)
Total Bilirubin: 0.4 mg/dL (ref 0.3–1.2)
Total Protein: 6.5 g/dL (ref 6.5–8.1)

## 2021-11-27 MED ORDER — DARATUMUMAB-HYALURONIDASE-FIHJ 1800-30000 MG-UT/15ML ~~LOC~~ SOLN
1800.0000 mg | Freq: Once | SUBCUTANEOUS | Status: AC
  Administered 2021-11-27: 1800 mg via SUBCUTANEOUS
  Filled 2021-11-27: qty 15

## 2021-11-27 MED ORDER — DEXAMETHASONE 4 MG PO TABS
20.0000 mg | ORAL_TABLET | Freq: Once | ORAL | Status: AC
  Administered 2021-11-27: 20 mg via ORAL
  Filled 2021-11-27: qty 5

## 2021-11-27 MED ORDER — DIPHENHYDRAMINE HCL 25 MG PO CAPS
50.0000 mg | ORAL_CAPSULE | Freq: Once | ORAL | Status: AC
  Administered 2021-11-27: 50 mg via ORAL
  Filled 2021-11-27: qty 2

## 2021-11-27 MED ORDER — ACETAMINOPHEN 325 MG PO TABS
650.0000 mg | ORAL_TABLET | Freq: Once | ORAL | Status: AC
  Administered 2021-11-27: 650 mg via ORAL
  Filled 2021-11-27: qty 2

## 2021-11-27 NOTE — Progress Notes (Signed)
Hematology and Oncology Follow Up Visit  Alexander Saunders [id number] 05-06-1950 71 y.o. 11/27/2021 11:30 AM [name], [name], MDShadad, Mathis Dad, MD   Principle Diagnosis: 67 year old man with kappa light chain multiple myeloma presented with plasmacytoma in 2018.  He he developed relapsed disease with anemia and 50% plasma cell involvement in the bone marrow in December 2023.   Prior Therapy:  He is status post surgical decompression of an epidural mass on 09/04/2016 and the pathology showed a plasma cell neoplasm.  He is S/P adjuvant radiation therapy to the thoracic spine to be completed on 10/14/2016.  Velcade 1.5 mg/m weekly with dexamethasone 20 mg and Cytoxan 600 mg po. Therapy started on 10/24/2016.   High-dose chemotherapy followed by stem cell transplant: Melphalan 200 mg/m2 completed on 06/17/17 followed by autologous stem cell rescue 06/18/17.  He achieved complete response.   Zometa 4 mg every 3 months started in February 2020.  Last treatment given in January 2022.   Revlimid 10 mg daily for 21 days out of a 28-day started in February 2020.  Therapy discontinued in November 2022 because of worsening anemia.  Carfilzomib 20 mg/m weekly started on February 19, 2021.  Therapy stopped in March 2023 for presumed cardiomyopathy complication.   Daratumumab 1800 mg subcutaneous injection weekly started on February 19, 2021. Dexamethasone weekly started on February 19, 2021.  Current therapy:    Daratumumab 1800 mg subcutaneous injections per protocol currently every 28 days with Dexamethasone weekly 20 mg.    Interim History:  Alexander Saunders is here for repeat evaluation.  Since the last visit, he continues to feel well without any complaints.  He denies any nausea vomiting or abdominal pain.  He denies any worsening neuropathy.  He does have a lower extremity neuropathy that is chronic.  He has not reported any hospitalizations or illnesses.  He had tolerated Darzalex without  any complications at this time.  His performance status and quality of life remain excellent.     Medications: Reviewed without changes. Current Outpatient Medications  Medication Sig Dispense Refill   acyclovir (ZOVIRAX) 400 MG tablet Take 1 tablet (400 mg total) by mouth daily. 90 tablet 3   aspirin EC 81 MG tablet Take 81 mg by mouth daily.     B Complex Vitamins (B-COMPLEX/B-12) TABS Take 1 tablet by mouth daily.     bisacodyl (DULCOLAX) 5 MG EC tablet Take 5 mg by mouth daily as needed for moderate constipation.     carboxymethylcellulose (REFRESH PLUS) 0.5 % SOLN Place 1 drop into both eyes 3 (three) times daily as needed (dry eyes).     carvedilol (COREG) 12.5 MG tablet Take 1 tablet (12.5 mg total) by mouth 2 (two) times daily. 180 tablet 3   cholecalciferol (VITAMIN D3) 25 MCG (1000 UNIT) tablet Take 1,000 Units by mouth daily.     diphenhydramine-acetaminophen (TYLENOL PM EXTRA STRENGTH) 25-500 MG TABS tablet Take 1 tablet by mouth at bedtime as needed (pain).     gabapentin (NEURONTIN) 300 MG capsule TAKE 3 CAPSULE BY MOUTH THREE TIMES DAILY. 3 CAPSULE IN THE MORNING 3 CAPSULE IN THE AFTERNOON AND 3 CAPSULE AT NIGHT 270 capsule 3   losartan (COZAAR) 50 MG tablet Take 1 tablet (50 mg total) by mouth daily. 90 tablet 1   Multiple Vitamin (MULTIVITAMIN) capsule Take 1 capsule by mouth daily.     sildenafil (VIAGRA) 50 MG tablet daily as needed. (Patient not taking: Reported on 10/08/2021)     sodium chloride (OCEAN) 0.65 %  SOLN nasal spray Place 1 spray into both nostrils daily as needed for congestion.     spironolactone (ALDACTONE) 25 MG tablet TAKE 1 TABLET(25 MG) BY MOUTH DAILY 15 tablet 3   traMADol (ULTRAM) 50 MG tablet Take 1 tablet (50 mg total) by mouth every 6 (six) hours as needed. 30 tablet 1   No current facility-administered medications for this visit.     Allergies:  Allergies  Allergen Reactions   Jardiance [Empagliflozin] Other (See Comments)    UTI like  symptoms      Physical Exam:        Blood pressure 123/78, pulse 82, temperature 98.6 F (37 C), temperature source Tympanic, weight 203 lb 8 oz (92.3 kg), SpO2 100 %.           ECOG: 1    General appearance: Comfortable appearing without any discomfort Head: Normocephalic without any trauma Oropharynx: Mucous membranes are moist and pink without any thrush or ulcers. Eyes: Pupils are equal and round reactive to light. Lymph nodes: No cervical, supraclavicular, inguinal or axillary lymphadenopathy.   Heart:regular rate and rhythm.  S1 and S2 without leg edema. Lung: Clear without any rhonchi or wheezes.  No dullness to percussion. Abdomin: Soft, nontender, nondistended with good bowel sounds.  No hepatosplenomegaly. Musculoskeletal: No joint deformity or effusion.  Full range of motion noted. Neurological: No deficits noted on motor, sensory and deep tendon reflex exam. Skin: No petechial rash or dryness.  Appeared moist.                 Lab Results: Lab Results  Component Value Date   WBC 6.0 11/27/2021   HGB 13.4 11/27/2021   HCT 38.7 (L) 11/27/2021   MCV 96.5 11/27/2021   PLT 246 11/27/2021     Chemistry      Component Value Date/Time   NA 139 10/30/2021 1117   NA 141 09/27/2021 0815   NA 137 02/06/2017 0828   K 4.1 10/30/2021 1117   K 4.0 02/06/2017 0828   CL 106 10/30/2021 1117   CO2 29 10/30/2021 1117   CO2 24 02/06/2017 0828   BUN 16 10/30/2021 1117   BUN 16 09/27/2021 0815   BUN 14.9 02/06/2017 0828   CREATININE 0.99 10/30/2021 1117   CREATININE 0.9 02/06/2017 0828      Component Value Date/Time   CALCIUM 9.4 10/30/2021 1117   CALCIUM 9.1 02/06/2017 0828   ALKPHOS 24 (L) 10/30/2021 1117   ALKPHOS 31 (L) 02/06/2017 0828   AST 12 (L) 10/30/2021 1117   AST 32 02/06/2017 0828   ALT 10 10/30/2021 1117   ALT 43 02/06/2017 0828   BILITOT 0.8 10/30/2021 1117   BILITOT 0.42 02/06/2017 0828         Latest Reference Range &  Units 07/10/21 11:28 08/07/21 13:03 10/16/21 12:52  Kappa free light chain 3.3 - 19.4 mg/L 8.3 7.1 7.1  Lambda free light chains 5.7 - 26.3 mg/L 4.3 (L) 3.8 (L) 4.3 (L)  Kappa, lambda light chain ratio 0.26 - 1.65  1.93 (H) 1.87 (H) 1.65  (L): Data is abnormally low (H): Data is abnormally high   Impression and Plan:  71 year old man with:   1.  Kappa light chain multiple myeloma with relapsed disease in December 2022.  She presented with plasmacytoma in 2018.    The natural course of his disease was reviewed and treatment choices were discussed.  He continues to be on Darzalex without any major complications.  Alternative treatment options  were reviewed at this time and I have opted to continue with the same treatment regimen for the time being.  Protein studies in September 2023 showed normal kappa to lambda ratio and very little M spike of 0.2 g/dL.  He has follow-up at [facility] in the next week.   2.  Bone health: He has completed 2 years of Zometa and I will be due to the future if needed 2.   3.  Neuropathy: He remains on gabapentin.  His neuropathy is related to his initial plasmacytoma.  4.  VZV prophylaxis: I recommended continuing acyclovir.  No reactivation noted.  5.  Anemia: His hemoglobin is back to normal with his anemia related to plasma cell disorder.  6.  Cardiomyopathy: No signs or symptoms of heart failure noted at this time.  7. Follow-up: In 4 weeks for repeat follow-up.  30 minutes were dedicated to this visit.  The time was spent on reviewing laboratory data, disease status update and future plan of care discussion.   Zola Button, MD 10/25/202311:30 AM

## 2021-11-27 NOTE — Patient Instructions (Signed)
[facility]COL[facility]  Discharge Instructions: Thank you for choosing [facility] to provide your oncology and hematology care.   If you have a lab appointment with the Cancer Center, please go directly to the Cancer Center and check in at the registration area.   Wear comfortable clothing and clothing appropriate for easy access to any Portacath or PICC line.   We strive to give you quality time with your provider. You may need to reschedule your appointment if you arrive late (15 or more minutes).  Arriving late affects you and other patients whose appointments are after yours.  Also, if you miss three or more appointments without notifying the office, you may be dismissed from the clinic at the provider's discretion.      For prescription refill requests, have your pharmacy contact our office and allow 72 hours for refills to be completed.    Today you received the following chemotherapy and/or immunotherapy agents: daratumumab      To help prevent nausea and vomiting after your treatment, we encourage you to take your nausea medication as directed.  BELOW ARE SYMPTOMS THAT SHOULD BE REPORTED IMMEDIATELY: *FEVER GREATER THAN 100.4 F (38 C) OR HIGHER *CHILLS OR SWEATING *NAUSEA AND VOMITING THAT IS NOT CONTROLLED WITH YOUR NAUSEA MEDICATION *UNUSUAL SHORTNESS OF BREATH *UNUSUAL BRUISING OR BLEEDING *URINARY PROBLEMS (pain or burning when urinating, or frequent urination) *BOWEL PROBLEMS (unusual diarrhea, constipation, pain near the anus) TENDERNESS IN MOUTH AND THROAT WITH OR WITHOUT PRESENCE OF ULCERS (sore throat, sores in mouth, or a toothache) UNUSUAL RASH, SWELLING OR PAIN  UNUSUAL VAGINAL DISCHARGE OR ITCHING   Items with * indicate a potential emergency and should be followed up as soon as possible or go to the Emergency Department if any problems should occur.  Please show the CHEMOTHERAPY ALERT CARD or IMMUNOTHERAPY ALERT CARD at check-in  to the Emergency Department and triage nurse.  Should you have questions after your visit or need to cancel or reschedule your appointment, please contact [facility]  Dept: [phone]  and follow the prompts.  Office hours are 8:00 a.m. to 4:30 p.m. Monday - Friday. Please note that voicemails left after 4:00 p.m. may not be returned until the following business day.  We are closed weekends and major holidays. You have access to a nurse at all times for urgent questions. Please call the main number to the clinic Dept: [phone] and follow the prompts.   For any non-urgent questions, you may also contact your provider using MyChart. We now offer e-Visits for anyone 18 and older to request care online for non-urgent symptoms. For details visit [email or web address].   Also download the MyChart app! Go to the app store, search "MyChart", open the app, select , and log in with your MyChart username and password.  Masks are optional in the cancer centers. If you would like for your care team to wear a mask while they are taking care of you, please let them know. You may have one support person who is at least 72 years old accompany you for your appointments. 

## 2021-12-03 DIAGNOSIS — Z9484 Stem cells transplant status: Secondary | ICD-10-CM | POA: Diagnosis not present

## 2021-12-03 DIAGNOSIS — Z7952 Long term (current) use of systemic steroids: Secondary | ICD-10-CM | POA: Diagnosis not present

## 2021-12-03 DIAGNOSIS — C9002 Multiple myeloma in relapse: Secondary | ICD-10-CM | POA: Diagnosis not present

## 2021-12-03 DIAGNOSIS — C9000 Multiple myeloma not having achieved remission: Secondary | ICD-10-CM | POA: Diagnosis not present

## 2021-12-03 DIAGNOSIS — Z79620 Long term (current) use of immunosuppressive biologic: Secondary | ICD-10-CM | POA: Diagnosis not present

## 2021-12-04 ENCOUNTER — Encounter: Payer: Self-pay | Admitting: Pharmacist

## 2021-12-04 DIAGNOSIS — I5020 Unspecified systolic (congestive) heart failure: Secondary | ICD-10-CM

## 2021-12-05 DIAGNOSIS — I5022 Chronic systolic (congestive) heart failure: Secondary | ICD-10-CM | POA: Diagnosis not present

## 2021-12-05 DIAGNOSIS — D638 Anemia in other chronic diseases classified elsewhere: Secondary | ICD-10-CM | POA: Diagnosis not present

## 2021-12-05 DIAGNOSIS — G620 Drug-induced polyneuropathy: Secondary | ICD-10-CM | POA: Diagnosis not present

## 2021-12-05 DIAGNOSIS — I10 Essential (primary) hypertension: Secondary | ICD-10-CM | POA: Diagnosis not present

## 2021-12-05 MED ORDER — SPIRONOLACTONE 25 MG PO TABS: 25.0000 mg | ORAL_TABLET | Freq: Every day | ORAL | 3 refills | Status: DC

## 2021-12-11 ENCOUNTER — Other Ambulatory Visit: Payer: Self-pay | Admitting: Cardiovascular Disease

## 2021-12-12 ENCOUNTER — Other Ambulatory Visit: Payer: Self-pay | Admitting: Oncology

## 2021-12-12 MED ORDER — LOSARTAN POTASSIUM 50 MG PO TABS: 50.0000 mg | ORAL_TABLET | Freq: Every day | ORAL | 1 refills | Status: DC

## 2021-12-12 NOTE — Addendum Note (Signed)
Addended by: Rollen Sox on: 12/12/2021 12:27 PM   Modules accepted: Orders

## 2021-12-25 ENCOUNTER — Inpatient Hospital Stay: Payer: Medicare Other

## 2021-12-25 ENCOUNTER — Inpatient Hospital Stay: Payer: Medicare Other | Attending: Oncology

## 2021-12-25 ENCOUNTER — Inpatient Hospital Stay (HOSPITAL_BASED_OUTPATIENT_CLINIC_OR_DEPARTMENT_OTHER): Payer: Medicare Other | Admitting: Oncology

## 2021-12-25 VITALS — BP 122/77 | HR 79 | Temp 98.1°F | Resp 17 | Ht 72.0 in | Wt 206.5 lb

## 2021-12-25 DIAGNOSIS — Z5112 Encounter for antineoplastic immunotherapy: Secondary | ICD-10-CM | POA: Diagnosis present

## 2021-12-25 DIAGNOSIS — C9000 Multiple myeloma not having achieved remission: Secondary | ICD-10-CM | POA: Diagnosis not present

## 2021-12-25 DIAGNOSIS — C9002 Multiple myeloma in relapse: Secondary | ICD-10-CM | POA: Diagnosis not present

## 2021-12-25 LAB — CMP (CANCER CENTER ONLY)
ALT: 9 U/L (ref 0–44)
AST: 14 U/L — ABNORMAL LOW (ref 15–41)
Albumin: 4.3 g/dL (ref 3.5–5.0)
Alkaline Phosphatase: 29 U/L — ABNORMAL LOW (ref 38–126)
Anion gap: 6 (ref 5–15)
BUN: 17 mg/dL (ref 8–23)
CO2: 27 mmol/L (ref 22–32)
Calcium: 9.5 mg/dL (ref 8.9–10.3)
Chloride: 105 mmol/L (ref 98–111)
Creatinine: 0.96 mg/dL (ref 0.61–1.24)
GFR, Estimated: >60 mL/min (ref >60)
Glucose, Bld: 106 mg/dL — ABNORMAL HIGH (ref 70–99)
Potassium: 4 mmol/L (ref 3.5–5.1)
Sodium: 138 mmol/L (ref 135–145)
Total Bilirubin: 0.8 mg/dL (ref 0.3–1.2)
Total Protein: 7.1 g/dL (ref 6.5–8.1)

## 2021-12-25 LAB — CBC WITH DIFFERENTIAL (CANCER CENTER ONLY)
Abs Immature Granulocytes: 0.04 10*3/uL (ref 0.00–0.07)
Basophils Absolute: 0 10*3/uL (ref 0.0–0.1)
Basophils Relative: 1 %
Eosinophils Absolute: 0.3 10*3/uL (ref 0.0–0.5)
Eosinophils Relative: 5 %
HCT: 36.7 % — ABNORMAL LOW (ref 39.0–52.0)
Hemoglobin: 12.8 g/dL — ABNORMAL LOW (ref 13.0–17.0)
Immature Granulocytes: 1 %
Lymphocytes Relative: 13 %
Lymphs Abs: 0.9 10*3/uL (ref 0.7–4.0)
MCH: 33.4 pg (ref 26.0–34.0)
MCHC: 34.9 g/dL (ref 30.0–36.0)
MCV: 95.8 fL (ref 80.0–100.0)
Monocytes Absolute: 1 10*3/uL (ref 0.1–1.0)
Monocytes Relative: 14 %
Neutro Abs: 4.5 10*3/uL (ref 1.7–7.7)
Neutrophils Relative %: 66 %
Platelet Count: 296 10*3/uL (ref 150–400)
RBC: 3.83 MIL/uL — ABNORMAL LOW (ref 4.22–5.81)
RDW: 13.3 % (ref 11.5–15.5)
WBC Count: 6.8 10*3/uL (ref 4.0–10.5)
nRBC: 0 % (ref 0.0–0.2)

## 2021-12-25 MED ORDER — DARATUMUMAB-HYALURONIDASE-FIHJ 1800-30000 MG-UT/15ML ~~LOC~~ SOLN
1800.0000 mg | Freq: Once | SUBCUTANEOUS | Status: AC
  Administered 2021-12-25: 1800 mg via SUBCUTANEOUS
  Filled 2021-12-25: qty 15

## 2021-12-25 MED ORDER — ACETAMINOPHEN 325 MG PO TABS
650.0000 mg | ORAL_TABLET | Freq: Once | ORAL | Status: AC
  Administered 2021-12-25: 650 mg via ORAL
  Filled 2021-12-25: qty 2

## 2021-12-25 MED ORDER — DEXAMETHASONE 4 MG PO TABS
20.0000 mg | ORAL_TABLET | Freq: Once | ORAL | Status: AC
  Administered 2021-12-25: 20 mg via ORAL
  Filled 2021-12-25: qty 5

## 2021-12-25 MED ORDER — DIPHENHYDRAMINE HCL 25 MG PO CAPS
50.0000 mg | ORAL_CAPSULE | Freq: Once | ORAL | Status: AC
  Administered 2021-12-25: 50 mg via ORAL
  Filled 2021-12-25: qty 2

## 2021-12-25 NOTE — Progress Notes (Signed)
Hematology and Oncology Follow Up Visit  Alexander Saunders [id number] July 10, 1950 71 y.o. 12/25/2021 11:36 AM Alexander Saunders, MDShadad, Mathis Dad, MD   Principle Diagnosis: 85 year old man with multiple myeloma diagnosed in 2018.  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  He developed relapsed disease in December 2022 with symptomatic anemia and 50% plasma cell involvement.   Prior Therapy:  He is status post surgical decompression of an epidural mass on 09/04/2016 and the pathology showed a plasma cell neoplasm.  He is S/P adjuvant radiation therapy to the thoracic spine to be completed on 10/14/2016.  Velcade 1.5 mg/m weekly with dexamethasone 20 mg and Cytoxan 600 mg po. Therapy started on 10/24/2016.   High-dose chemotherapy followed by stem cell transplant: Melphalan 200 mg/m2 completed on 06/17/17 followed by autologous stem cell rescue 06/18/17.  He achieved complete response.   Zometa 4 mg every 3 months started in February 2020.  Last treatment given in January 2022.   Revlimid 10 mg daily for 21 days out of a 28-day started in February 2020.  Therapy discontinued in November 2022 because of worsening anemia.  Carfilzomib 20 mg/m weekly started on February 19, 2021.  Therapy stopped in March 2023 for presumed cardiomyopathy complication.   Daratumumab 1800 mg subcutaneous injection weekly started on February 19, 2021 with Dexamethasone weekly.   Current therapy:    Daratumumab 1800 mg subcutaneous injections per protocol currently every 28 days with Dexamethasone weekly 20 mg.    Interim History:  Alexander Saunders returns today for a follow-up.  Since the last visit, he reports no major changes in his health.  He continues to tolerate daratumumab with dexamethasone without any issues.  He denies any nausea, vomiting or abdominal pain.  He denies any excessive fatigue or tiredness.  He did have COVID-19 infection and has resolved at this time.   Performance status quality of life remains unchanged.     Medications: Updated on review. Current Outpatient Medications  Medication Sig Dispense Refill   acyclovir (ZOVIRAX) 400 MG tablet Take 1 tablet (400 mg total) by mouth daily. 90 tablet 3   aspirin EC 81 MG tablet Take 81 mg by mouth daily.     B Complex Vitamins (B-COMPLEX/B-12) TABS Take 1 tablet by mouth daily.     bisacodyl (DULCOLAX) 5 MG EC tablet Take 5 mg by mouth daily as needed for moderate constipation.     carboxymethylcellulose (REFRESH PLUS) 0.5 % SOLN Place 1 drop into both eyes 3 (three) times daily as needed (dry eyes).     carvedilol (COREG) 12.5 MG tablet Take 1 tablet (12.5 mg total) by mouth 2 (two) times daily. 180 tablet 3   cholecalciferol (VITAMIN D3) 25 MCG (1000 UNIT) tablet Take 1,000 Units by mouth daily.     diphenhydramine-acetaminophen (TYLENOL PM EXTRA STRENGTH) 25-500 MG TABS tablet Take 1 tablet by mouth at bedtime as needed (pain).     gabapentin (NEURONTIN) 300 MG capsule TAKE 3 CAPSULES BY MOUTH THREE TIMES DAILY IN THE MORNING AND IN THE AFTERNOON AND AT NIGHT 270 capsule 3   losartan (COZAAR) 50 MG tablet Take 1 tablet (50 mg total) by mouth daily. 90 tablet 1   Multiple Vitamin (MULTIVITAMIN) capsule Take 1 capsule by mouth daily.     sildenafil (VIAGRA) 50 MG tablet daily as needed. (Patient not taking: Reported on 10/08/2021)     sodium chloride (OCEAN) 0.65 % SOLN nasal spray Place 1 spray into both nostrils daily as needed  for congestion.     spironolactone (ALDACTONE) 25 MG tablet Take 1 tablet (25 mg total) by mouth daily. 90 tablet 3   traMADol (ULTRAM) 50 MG tablet Take 1 tablet (50 mg total) by mouth every 6 (six) hours as needed. 30 tablet 1   No current facility-administered medications for this visit.     Allergies:  Allergies  Allergen Reactions   Jardiance [Empagliflozin] Other (See Comments)    UTI like symptoms      Physical Exam:         Blood pressure  122/77, pulse 79, temperature 98.1 F (36.7 C), temperature source Temporal, resp. rate 17, height 6' (1.829 m), weight 206 lb 8 oz (93.7 kg), SpO2 98 %.           ECOG: 1     General appearance: Alert, awake without any distress. Head: Atraumatic without abnormalities Oropharynx: Without any thrush or ulcers. Eyes: No scleral icterus. Lymph nodes: No lymphadenopathy noted in the cervical, supraclavicular, or axillary nodes Heart:regular rate and rhythm, without any murmurs or gallops.   Lung: Clear to auscultation without any rhonchi, wheezes or dullness to percussion. Abdomin: Soft, nontender without any shifting dullness or ascites. Musculoskeletal: No clubbing or cyanosis. Neurological: No motor or sensory deficits. Skin: No rashes or lesions. Psychiatric: Mood and affect appeared normal.                  Lab Results: Lab Results  Component Value Date   WBC 6.0 11/27/2021   HGB 13.4 11/27/2021   HCT 38.7 (L) 11/27/2021   MCV 96.5 11/27/2021   PLT 246 11/27/2021     Chemistry      Component Value Date/Time   NA 141 11/27/2021 1115   NA 141 09/27/2021 0815   NA 137 02/06/2017 0828   K 4.4 11/27/2021 1115   K 4.0 02/06/2017 0828   CL 105 11/27/2021 1115   CO2 30 11/27/2021 1115   CO2 24 02/06/2017 0828   BUN 14 11/27/2021 1115   BUN 16 09/27/2021 0815   BUN 14.9 02/06/2017 0828   CREATININE 1.01 11/27/2021 1115   CREATININE 0.9 02/06/2017 0828      Component Value Date/Time   CALCIUM 9.8 11/27/2021 1115   CALCIUM 9.1 02/06/2017 0828   ALKPHOS 27 (L) 11/27/2021 1115   ALKPHOS 31 (L) 02/06/2017 0828   AST 15 11/27/2021 1115   AST 32 02/06/2017 0828   ALT 17 11/27/2021 1115   ALT 43 02/06/2017 0828   BILITOT 0.4 11/27/2021 1115   BILITOT 0.42 02/06/2017 0828       Latest Reference Range & Units 07/10/21 11:29 08/07/21 13:04 10/16/21 12:52  M Protein SerPl Elph-Mcnc Not Observed g/dL 0.2 (H) (C) 0.2 (H) (C) 0.2 (H) (C)  IFE 1   Comment ! (C) Comment ! (C) Comment ! (C)  Globulin, Total 2.2 - 3.9 g/dL 2.1 (L) (C) 2.4 (C) 2.2 (C)  B-Globulin SerPl Elph-Mcnc 0.7 - 1.3 g/dL 0.7 (C) 1.0 (C) 0.9 (C)  IgG (Immunoglobin G), Serum 603 - 1,613 mg/dL 591 (L) 629 592 (L)  IgM (Immunoglobulin M), Srm 15 - 143 mg/dL 7 (L) 7 (L) 9 (L)  IgA 61 - 437 mg/dL 32 (L) 31 (L) 29 (L)    ree Kappa 3.30 - 19.40 mg/L 7.12  FREE LAMBDA 5.71 - 26.30 mg/L 4.24 Low   KAPPA/LAMBDA RATIO 0.26 - 1.65 1.68 High      Impression and Plan:  71 year old man with:   1.  Multiple myeloma diagnosed in 2018.  He developed relapsed disease with Kappa light chain in December 2022.     He continues to be on maintenance daratumumab without any major complications.  Protein studies obtained on December 03, 2021 at [facility] which continues to show a stable protein studies with kappa to lambda ratio that is close to normal.  Risks and benefits of continuing his maintenance therapy with daratumumab and dexamethasone were discussed.  Complications that include infusion related issues as well as GI toxicity were reiterated.  At this time he is a agreeable.  Salvage therapy options at this time were discussed today and during his visit at [facility].  These will be deferred unless he has relapsed disease in the future.   2.  Bone health: He is off Zometa treatment at this time and will be introduced if needed in the future.   3.  Neuropathy: No significant progression noted compared to previous involvement.  4.  VZV prophylaxis: No reactivation noted at this time.  He remains on acyclovir.  5.  Anemia: Related to plasma cell disorder and resolved at this time.  6.  Cardiomyopathy: No signs of decompensation at this time.   7. Follow-up: He will return in 1 month for a follow-up.  30 minutes were spent on this encounter.  The time was spent on updating disease status, treatment choices and outlining future plan of care review.   Zola Button, MD 11/22/202311:36 AM

## 2021-12-25 NOTE — Patient Instructions (Signed)
[facility] ONCOLOGY  Discharge Instructions: Thank you for choosing [facility] to provide your oncology and hematology care.   If you have a lab appointment with the [facility], please go directly to the [facility] and check in at the registration area.   Wear comfortable clothing and clothing appropriate for easy access to any Portacath or PICC line.   We strive to give you quality time with your provider. You may need to reschedule your appointment if you arrive late (15 or more minutes).  Arriving late affects you and other patients whose appointments are after yours.  Also, if you miss three or more appointments without notifying the office, you may be dismissed from the clinic at the provider's discretion.      For prescription refill requests, have your pharmacy contact our office and allow 72 hours for refills to be completed.    Today you received the following chemotherapy and/or immunotherapy agents: Darzalex Faspro      To help prevent nausea and vomiting after your treatment, we encourage you to take your nausea medication as directed.  BELOW ARE SYMPTOMS THAT SHOULD BE REPORTED IMMEDIATELY: *FEVER GREATER THAN 100.4 F (38 C) OR HIGHER *CHILLS OR SWEATING *NAUSEA AND VOMITING THAT IS NOT CONTROLLED WITH YOUR NAUSEA MEDICATION *UNUSUAL SHORTNESS OF BREATH *UNUSUAL BRUISING OR BLEEDING *URINARY PROBLEMS (pain or burning when urinating, or frequent urination) *BOWEL PROBLEMS (unusual diarrhea, constipation, pain near the anus) TENDERNESS IN MOUTH AND THROAT WITH OR WITHOUT PRESENCE OF ULCERS (sore throat, sores in mouth, or a toothache) UNUSUAL RASH, SWELLING OR PAIN  UNUSUAL VAGINAL DISCHARGE OR ITCHING   Items with * indicate a potential emergency and should be followed up as soon as possible or go to the Emergency Department if any problems should occur.  Please show the CHEMOTHERAPY ALERT CARD or IMMUNOTHERAPY ALERT CARD at  check-in to the Emergency Department and triage nurse.  Should you have questions after your visit or need to cancel or reschedule your appointment, please contact [facility]  Dept: [phone]  and follow the prompts.  Office hours are 8:00 a.m. to 4:30 p.m. Monday - Friday. Please note that voicemails left after 4:00 p.m. may not be returned until the following business day.  We are closed weekends and major holidays. You have access to a nurse at all times for urgent questions. Please call the main number to the clinic Dept: [phone] and follow the prompts.   For any non-urgent questions, you may also contact your provider using MyChart. We now offer e-Visits for anyone 71 and older to request care online for non-urgent symptoms. For details visit [email or web address].   Also download the MyChart app! Go to the app store, search "MyChart", open the app, select [facility], and log in with your MyChart username and password.  Masks are optional in the cancer centers. If you would like for your care team to wear a mask while they are taking care of you, please let them know. You may have one support person who is at least 72 years old accompany you for your appointments.

## 2021-12-26 ENCOUNTER — Other Ambulatory Visit: Payer: Self-pay

## 2021-12-27 LAB — KAPPA/LAMBDA LIGHT CHAINS
Kappa free light chain: 8.4 mg/L (ref 3.3–19.4)
Kappa, lambda light chain ratio: 1.95 — ABNORMAL HIGH (ref 0.26–1.65)
Lambda free light chains: 4.3 mg/L — ABNORMAL LOW (ref 5.7–26.3)

## 2021-12-30 LAB — MULTIPLE MYELOMA PANEL, SERUM
Albumin SerPl Elph-Mcnc: 4 g/dL (ref 2.9–4.4)
Albumin/Glob SerPl: 1.8 — ABNORMAL HIGH (ref 0.7–1.7)
Alpha 1: 0.2 g/dL (ref 0.0–0.4)
Alpha2 Glob SerPl Elph-Mcnc: 0.7 g/dL (ref 0.4–1.0)
B-Globulin SerPl Elph-Mcnc: 0.9 g/dL (ref 0.7–1.3)
Gamma Glob SerPl Elph-Mcnc: 0.6 g/dL (ref 0.4–1.8)
Globulin, Total: 2.3 g/dL (ref 2.2–3.9)
IgA: 29 mg/dL — ABNORMAL LOW (ref 61–437)
IgG (Immunoglobin G), Serum: 643 mg/dL (ref 603–1613)
IgM (Immunoglobulin M), Srm: 9 mg/dL — ABNORMAL LOW (ref 15–143)
M Protein SerPl Elph-Mcnc: 0.2 g/dL — ABNORMAL HIGH
Total Protein ELP: 6.3 g/dL (ref 6.0–8.5)

## 2022-01-02 ENCOUNTER — Other Ambulatory Visit: Payer: Self-pay

## 2022-01-04 ENCOUNTER — Other Ambulatory Visit: Payer: Self-pay

## 2022-01-14 ENCOUNTER — Ambulatory Visit: Payer: Self-pay

## 2022-01-14 NOTE — Patient Outreach (Signed)
  Care Coordination   Follow Up Visit Note   01/14/2022 Name: Alexander Saunders MRN: [id number] DOB: May 13, 1950  Alexander Saunders is a 71 y.o. year old male who sees [name], [name], MD for primary care. I spoke with  Alexander Saunders by phone today.  What matters to the patients health and wellness today?  Mr. Coppolino reports he is doing well. He reports upcoming appointments with the [facility] for an annual exam; a dental visit and visit with oncologist next week. He denies any questions or concerns at this time. Mr. Pizano is agreeable to case closure and will call provider if care coordination needs in the future.  Goals Addressed             This Visit's Progress    COMPLETED: improve heart health       Care Coordination Interventions: Encouraged to continue to attend provider visits as scheduled Encouraged to call provider for health questions or concerns as needed Encouraged patient to contact RNCM and or primary care provider if care coordination needs in the future        SDOH assessments and interventions completed:  No  Care Coordination Interventions:  Yes, provided   Follow up plan: No further intervention required.   Encounter Outcome:  Pt. Visit Completed   Thea Silversmith, RN, MSN, BSN, [facility] Coordinator [phone]

## 2022-01-14 NOTE — Patient Instructions (Signed)
Visit Information  Thank you for taking time to visit with me today. Please don't hesitate to contact me if I can be of assistance to you.   Following are the goals we discussed today:   Goals Addressed             This Visit's Progress    COMPLETED: improve heart health       Care Coordination Interventions: Encouraged to continue to attend provider visits as scheduled Encouraged to call provider for health questions or concerns as needed Encouraged patient to contact RNCM and or primary care provider if care coordination needs in the future         If you are experiencing a Mental Health or [facility] or need someone to talk to, please call the Suicide and Crisis Lifeline: 988  Patient verbalizes understanding of instructions and care plan provided today and agrees to view in [facility]. Active MyChart status and patient understanding of how to access instructions and care plan via MyChart confirmed with patient.     Thea Silversmith, RN, MSN, BSN, [facility] Coordinator [phone]

## 2022-01-22 ENCOUNTER — Inpatient Hospital Stay: Payer: Medicare Other

## 2022-01-22 ENCOUNTER — Inpatient Hospital Stay: Payer: Medicare Other | Attending: Oncology | Admitting: Oncology

## 2022-01-22 ENCOUNTER — Other Ambulatory Visit: Payer: Self-pay

## 2022-01-22 DIAGNOSIS — Z5112 Encounter for antineoplastic immunotherapy: Secondary | ICD-10-CM | POA: Insufficient documentation

## 2022-01-22 DIAGNOSIS — D649 Anemia, unspecified: Secondary | ICD-10-CM | POA: Insufficient documentation

## 2022-01-22 DIAGNOSIS — G629 Polyneuropathy, unspecified: Secondary | ICD-10-CM | POA: Insufficient documentation

## 2022-01-22 DIAGNOSIS — C9000 Multiple myeloma not having achieved remission: Secondary | ICD-10-CM | POA: Diagnosis not present

## 2022-01-22 DIAGNOSIS — C9002 Multiple myeloma in relapse: Secondary | ICD-10-CM | POA: Diagnosis not present

## 2022-01-22 LAB — CBC WITH DIFFERENTIAL (CANCER CENTER ONLY)
Abs Immature Granulocytes: 0.03 10*3/uL (ref 0.00–0.07)
Basophils Absolute: 0 10*3/uL (ref 0.0–0.1)
Basophils Relative: 0 %
Eosinophils Absolute: 0.4 10*3/uL (ref 0.0–0.5)
Eosinophils Relative: 6 %
HCT: 36.9 % — ABNORMAL LOW (ref 39.0–52.0)
Hemoglobin: 12.6 g/dL — ABNORMAL LOW (ref 13.0–17.0)
Immature Granulocytes: 1 %
Lymphocytes Relative: 13 %
Lymphs Abs: 0.8 10*3/uL (ref 0.7–4.0)
MCH: 33.3 pg (ref 26.0–34.0)
MCHC: 34.1 g/dL (ref 30.0–36.0)
MCV: 97.6 fL (ref 80.0–100.0)
Monocytes Absolute: 0.9 10*3/uL (ref 0.1–1.0)
Monocytes Relative: 14 %
Neutro Abs: 3.9 10*3/uL (ref 1.7–7.7)
Neutrophils Relative %: 66 %
Platelet Count: 257 10*3/uL (ref 150–400)
RBC: 3.78 MIL/uL — ABNORMAL LOW (ref 4.22–5.81)
RDW: 13.9 % (ref 11.5–15.5)
WBC Count: 5.9 10*3/uL (ref 4.0–10.5)
nRBC: 0 % (ref 0.0–0.2)

## 2022-01-22 LAB — CMP (CANCER CENTER ONLY)
ALT: 10 U/L (ref 0–44)
AST: 13 U/L — ABNORMAL LOW (ref 15–41)
Albumin: 4.2 g/dL (ref 3.5–5.0)
Alkaline Phosphatase: 25 U/L — ABNORMAL LOW (ref 38–126)
Anion gap: 4 — ABNORMAL LOW (ref 5–15)
BUN: 25 mg/dL — ABNORMAL HIGH (ref 8–23)
CO2: 29 mmol/L (ref 22–32)
Calcium: 9.7 mg/dL (ref 8.9–10.3)
Chloride: 106 mmol/L (ref 98–111)
Creatinine: 0.94 mg/dL (ref 0.61–1.24)
GFR, Estimated: >60 mL/min (ref >60)
Glucose, Bld: 122 mg/dL — ABNORMAL HIGH (ref 70–99)
Potassium: 3.9 mmol/L (ref 3.5–5.1)
Sodium: 139 mmol/L (ref 135–145)
Total Bilirubin: 0.4 mg/dL (ref 0.3–1.2)
Total Protein: 6.4 g/dL — ABNORMAL LOW (ref 6.5–8.1)

## 2022-01-22 MED ORDER — DARATUMUMAB-HYALURONIDASE-FIHJ 1800-30000 MG-UT/15ML ~~LOC~~ SOLN
1800.0000 mg | Freq: Once | SUBCUTANEOUS | Status: AC
  Administered 2022-01-22: 1800 mg via SUBCUTANEOUS
  Filled 2022-01-22: qty 15

## 2022-01-22 MED ORDER — ACETAMINOPHEN 325 MG PO TABS
650.0000 mg | ORAL_TABLET | Freq: Once | ORAL | Status: AC
  Administered 2022-01-22: 650 mg via ORAL
  Filled 2022-01-22: qty 2

## 2022-01-22 MED ORDER — DEXAMETHASONE 4 MG PO TABS
20.0000 mg | ORAL_TABLET | Freq: Once | ORAL | Status: AC
  Administered 2022-01-22: 20 mg via ORAL
  Filled 2022-01-22: qty 5

## 2022-01-22 MED ORDER — DIPHENHYDRAMINE HCL 25 MG PO CAPS
50.0000 mg | ORAL_CAPSULE | Freq: Once | ORAL | Status: AC
  Administered 2022-01-22: 50 mg via ORAL
  Filled 2022-01-22: qty 2

## 2022-01-22 NOTE — Patient Instructions (Signed)
[facility] ONCOLOGY  Discharge Instructions: Thank you for choosing [facility] to provide your oncology and hematology care.   If you have a lab appointment with the [facility], please go directly to the [facility] and check in at the registration area.   Wear comfortable clothing and clothing appropriate for easy access to any Portacath or PICC line.   We strive to give you quality time with your provider. You may need to reschedule your appointment if you arrive late (15 or more minutes).  Arriving late affects you and other patients whose appointments are after yours.  Also, if you miss three or more appointments without notifying the office, you may be dismissed from the clinic at the provider's discretion.      For prescription refill requests, have your pharmacy contact our office and allow 72 hours for refills to be completed.    Today you received the following chemotherapy and/or immunotherapy agents: Darzalex Faspro      To help prevent nausea and vomiting after your treatment, we encourage you to take your nausea medication as directed.  BELOW ARE SYMPTOMS THAT SHOULD BE REPORTED IMMEDIATELY: *FEVER GREATER THAN 100.4 F (38 C) OR HIGHER *CHILLS OR SWEATING *NAUSEA AND VOMITING THAT IS NOT CONTROLLED WITH YOUR NAUSEA MEDICATION *UNUSUAL SHORTNESS OF BREATH *UNUSUAL BRUISING OR BLEEDING *URINARY PROBLEMS (pain or burning when urinating, or frequent urination) *BOWEL PROBLEMS (unusual diarrhea, constipation, pain near the anus) TENDERNESS IN MOUTH AND THROAT WITH OR WITHOUT PRESENCE OF ULCERS (sore throat, sores in mouth, or a toothache) UNUSUAL RASH, SWELLING OR PAIN  UNUSUAL VAGINAL DISCHARGE OR ITCHING   Items with * indicate a potential emergency and should be followed up as soon as possible or go to the Emergency Department if any problems should occur.  Please show the CHEMOTHERAPY ALERT CARD or IMMUNOTHERAPY ALERT CARD at  check-in to the Emergency Department and triage nurse.  Should you have questions after your visit or need to cancel or reschedule your appointment, please contact [facility]  Dept: [phone]  and follow the prompts.  Office hours are 8:00 a.m. to 4:30 p.m. Monday - Friday. Please note that voicemails left after 4:00 p.m. may not be returned until the following business day.  We are closed weekends and major holidays. You have access to a nurse at all times for urgent questions. Please call the main number to the clinic Dept: [phone] and follow the prompts.   For any non-urgent questions, you may also contact your provider using MyChart. We now offer e-Visits for anyone 33 and older to request care online for non-urgent symptoms. For details visit [email or web address].   Also download the MyChart app! Go to the app store, search "MyChart", open the app, select , and log in with your MyChart username and password.  Masks are optional in the cancer centers. If you would like for your care team to wear a mask while they are taking care of you, please let them know. You may have one support person who is at least 72 years old accompany you for your appointments.

## 2022-01-22 NOTE — Addendum Note (Signed)
Addended by: Tora Kindred on: 01/22/2022 03:43 PM   Modules accepted: Orders

## 2022-01-22 NOTE — Progress Notes (Signed)
Hematology and Oncology Follow Up Visit  SHEDRIC FREDERICKS [id number] [birth year] 71 y.o. 01/22/2022 11:28 AM [name], [name], MDShadad, Mathis Dad, MD   Principle Diagnosis: 81 year old man with kappa light chain multiple myeloma diagnosed in 2018 with relapsed disease in 2022.  He presented with plasmacytoma initially and symptomatic anemia upon relapse.   Prior Therapy:  He is status post surgical decompression of an epidural mass on 09/04/2016 and the pathology showed a plasma cell neoplasm.  He is S/P adjuvant radiation therapy to the thoracic spine to be completed on 10/14/2016.  Velcade 1.5 mg/m weekly with dexamethasone 20 mg and Cytoxan 600 mg po. Therapy started on 10/24/2016.   High-dose chemotherapy followed by stem cell transplant: Melphalan 200 mg/m2 completed on 06/17/17 followed by autologous stem cell rescue 06/18/17.  He achieved complete response.   Zometa 4 mg every 3 months started in February 2020.  Last treatment given in January 2022.   Revlimid 10 mg daily for 21 days out of a 28-day started in February 2020.  Therapy discontinued in November 2022 because of worsening anemia.  Carfilzomib 20 mg/m weekly started on February 19, 2021.  Therapy stopped in March 2023 for presumed cardiomyopathy complication.   Daratumumab 1800 mg subcutaneous injection weekly started on February 19, 2021 with Dexamethasone weekly.   Current therapy:    Daratumumab 1800 mg subcutaneous injections per protocol currently every 28 days with Dexamethasone weekly 20 mg.    Interim History:  Mr. Foots presents today for repeat evaluation.  Since the last visit, he continues to tolerate the current therapy with excellent tolerance and no side effects.  He denies any nausea, fatigue or constitutional symptoms.  He remains very active continues to exercise regularly.  He denies any bone fractures.  He denies any hospitalizations or illnesses.     Medications: Reviewed without  changes. Current Outpatient Medications  Medication Sig Dispense Refill   acyclovir (ZOVIRAX) 400 MG tablet Take 1 tablet (400 mg total) by mouth daily. 90 tablet 3   aspirin EC 81 MG tablet Take 81 mg by mouth daily.     B Complex Vitamins (B-COMPLEX/B-12) TABS Take 1 tablet by mouth daily.     bisacodyl (DULCOLAX) 5 MG EC tablet Take 5 mg by mouth daily as needed for moderate constipation.     carboxymethylcellulose (REFRESH PLUS) 0.5 % SOLN Place 1 drop into both eyes 3 (three) times daily as needed (dry eyes).     carvedilol (COREG) 12.5 MG tablet Take 1 tablet (12.5 mg total) by mouth 2 (two) times daily. 180 tablet 3   cholecalciferol (VITAMIN D3) 25 MCG (1000 UNIT) tablet Take 1,000 Units by mouth daily.     diphenhydramine-acetaminophen (TYLENOL PM EXTRA STRENGTH) 25-500 MG TABS tablet Take 1 tablet by mouth at bedtime as needed (pain).     gabapentin (NEURONTIN) 300 MG capsule TAKE 3 CAPSULES BY MOUTH THREE TIMES DAILY IN THE MORNING AND IN THE AFTERNOON AND AT NIGHT 270 capsule 3   losartan (COZAAR) 50 MG tablet Take 1 tablet (50 mg total) by mouth daily. 90 tablet 1   Multiple Vitamin (MULTIVITAMIN) capsule Take 1 capsule by mouth daily.     sildenafil (VIAGRA) 50 MG tablet daily as needed. (Patient not taking: Reported on 10/08/2021)     sodium chloride (OCEAN) 0.65 % SOLN nasal spray Place 1 spray into both nostrils daily as needed for congestion.     spironolactone (ALDACTONE) 25 MG tablet Take 1 tablet (25 mg total) by mouth daily.  90 tablet 3   traMADol (ULTRAM) 50 MG tablet Take 1 tablet (50 mg total) by mouth every 6 (six) hours as needed. 30 tablet 1   No current facility-administered medications for this visit.     Allergies:  Allergies  Allergen Reactions   Jardiance [Empagliflozin] Other (See Comments)    UTI like symptoms      Physical Exam:         Blood pressure (!) 140/76, pulse 73, temperature 97.9 F (36.6 C), temperature source Temporal, resp. rate  16, weight 207 lb 8 oz (94.1 kg), SpO2 99 %.           ECOG: 1   General appearance: Comfortable appearing without any discomfort Head: Normocephalic without any trauma Oropharynx: Mucous membranes are moist and pink without any thrush or ulcers. Eyes: Pupils are equal and round reactive to light. Lymph nodes: No cervical, supraclavicular, inguinal or axillary lymphadenopathy.   Heart:regular rate and rhythm.  S1 and S2 without leg edema. Lung: Clear without any rhonchi or wheezes.  No dullness to percussion. Abdomin: Soft, nontender, nondistended with good bowel sounds.  No hepatosplenomegaly. Musculoskeletal: No joint deformity or effusion.  Full range of motion noted. Neurological: No deficits noted on motor, sensory and deep tendon reflex exam. Skin: No petechial rash or dryness.  Appeared moist.                   Lab Results: Lab Results  Component Value Date   WBC 5.9 01/22/2022   HGB 12.6 (L) 01/22/2022   HCT 36.9 (L) 01/22/2022   MCV 97.6 01/22/2022   PLT 257 01/22/2022     Chemistry      Component Value Date/Time   NA 138 12/25/2021 1120   NA 141 09/27/2021 0815   NA 137 02/06/2017 0828   K 4.0 12/25/2021 1120   K 4.0 02/06/2017 0828   CL 105 12/25/2021 1120   CO2 27 12/25/2021 1120   CO2 24 02/06/2017 0828   BUN 17 12/25/2021 1120   BUN 16 09/27/2021 0815   BUN 14.9 02/06/2017 0828   CREATININE 0.96 12/25/2021 1120   CREATININE 0.9 02/06/2017 0828      Component Value Date/Time   CALCIUM 9.5 12/25/2021 1120   CALCIUM 9.1 02/06/2017 0828   ALKPHOS 29 (L) 12/25/2021 1120   ALKPHOS 31 (L) 02/06/2017 0828   AST 14 (L) 12/25/2021 1120   AST 32 02/06/2017 0828   ALT 9 12/25/2021 1120   ALT 43 02/06/2017 0828   BILITOT 0.8 12/25/2021 1120   BILITOT 0.42 02/06/2017 0828          [facility]i  Latest Reference Range & Units 10/16/21 12:52 12/25/21 11:20  IgG (Immunoglobin G), Serum 603 - 1,613 mg/dL 592 (L) 643  IgM (Immunoglobulin  M), Srm 15 - 143 mg/dL 9 (L) 9 (L)  IgA 61 - 437 mg/dL 29 (L) 29 (L)  Kappa free light chain 3.3 - 19.4 mg/L 7.1 8.4  Lambda free light chains 5.7 - 26.3 mg/L 4.3 (L) 4.3 (L)  Kappa, lambda light chain ratio 0.26 - 1.65  1.65 1.95 (H)  (L): Data is abnormally low (H): Data is abnormally highon and Plan:  71 year old man with:   1.  Kappa light chain multiple myeloma diagnosed in 2018 with relapsed disease in 2022.   The natural course of his disease and treatment choices were discussed at this time.  He continues to be on daratumumab maintenance with very little residual disease and M  spike of 0.2 g/dL.  As kappa light chain continues to be within normal range although he does have slight increase in his ratio.  He has no symptomatic progression at this time and continues to do well.  I recommended the same treatment protocol for the time being and different salvage therapy option to be used upon progression including CAR-T therapy which is being considered at [facility] where he follows as well.  He did receive his stem cell transplant there.   2.  Bone health: He is off Zometa at this point and will be introduced if he has progression of disease.   3.  Neuropathy: Related to previous plasmacytoma with overall manageable symptoms.  4.  VZV prophylaxis: He is currently on acyclovir without any reactivation.  5.  Anemia: His hemoglobin close to normal range at this time without any exacerbation.  This is related to plasma cell disorder.  6.  Cardiomyopathy: He continues to follow with cardiology with no symptoms at this time.   7. Follow-up: In 4 weeks for repeat evaluation.  30 minutes were located to this visit.  Time spent on updating disease status, treatment choices and outlining future plan of care review.   Zola Button, MD 12/20/202311:28 AM

## 2022-01-23 LAB — KAPPA/LAMBDA LIGHT CHAINS
Kappa free light chain: 7.9 mg/L (ref 3.3–19.4)
Kappa, lambda light chain ratio: 1.68 — ABNORMAL HIGH (ref 0.26–1.65)
Lambda free light chains: 4.7 mg/L — ABNORMAL LOW (ref 5.7–26.3)

## 2022-01-24 LAB — MULTIPLE MYELOMA PANEL, SERUM
Albumin SerPl Elph-Mcnc: 3.8 g/dL (ref 2.9–4.4)
Albumin/Glob SerPl: 1.8 — ABNORMAL HIGH (ref 0.7–1.7)
Alpha 1: 0.2 g/dL (ref 0.0–0.4)
Alpha2 Glob SerPl Elph-Mcnc: 0.7 g/dL (ref 0.4–1.0)
B-Globulin SerPl Elph-Mcnc: 0.8 g/dL (ref 0.7–1.3)
Gamma Glob SerPl Elph-Mcnc: 0.5 g/dL (ref 0.4–1.8)
Globulin, Total: 2.2 g/dL (ref 2.2–3.9)
IgA: 29 mg/dL — ABNORMAL LOW (ref 61–437)
IgG (Immunoglobin G), Serum: 606 mg/dL (ref 603–1613)
IgM (Immunoglobulin M), Srm: 9 mg/dL — ABNORMAL LOW (ref 15–143)
Total Protein ELP: 6 g/dL (ref 6.0–8.5)

## 2022-02-10 ENCOUNTER — Telehealth: Payer: Self-pay | Admitting: Oncology

## 2022-02-10 NOTE — Telephone Encounter (Signed)
Called patient regarding upcoming January appointments, patient is notified. 

## 2022-02-19 ENCOUNTER — Inpatient Hospital Stay: Payer: Medicare Other | Attending: Oncology

## 2022-02-19 ENCOUNTER — Inpatient Hospital Stay (HOSPITAL_BASED_OUTPATIENT_CLINIC_OR_DEPARTMENT_OTHER): Payer: Medicare Other | Admitting: Hematology

## 2022-02-19 ENCOUNTER — Other Ambulatory Visit: Payer: Self-pay

## 2022-02-19 ENCOUNTER — Inpatient Hospital Stay: Payer: Medicare Other

## 2022-02-19 DIAGNOSIS — C9000 Multiple myeloma not having achieved remission: Secondary | ICD-10-CM

## 2022-02-19 DIAGNOSIS — Z5112 Encounter for antineoplastic immunotherapy: Secondary | ICD-10-CM | POA: Insufficient documentation

## 2022-02-19 DIAGNOSIS — C9002 Multiple myeloma in relapse: Secondary | ICD-10-CM | POA: Insufficient documentation

## 2022-02-19 LAB — CBC WITH DIFFERENTIAL (CANCER CENTER ONLY)
Abs Immature Granulocytes: 0.01 10*3/uL (ref 0.00–0.07)
Basophils Absolute: 0 10*3/uL (ref 0.0–0.1)
Basophils Relative: 0 %
Eosinophils Absolute: 0.3 10*3/uL (ref 0.0–0.5)
Eosinophils Relative: 5 %
HCT: 36.8 % — ABNORMAL LOW (ref 39.0–52.0)
Hemoglobin: 12.8 g/dL — ABNORMAL LOW (ref 13.0–17.0)
Immature Granulocytes: 0 %
Lymphocytes Relative: 14 %
Lymphs Abs: 0.9 10*3/uL (ref 0.7–4.0)
MCH: 33.3 pg (ref 26.0–34.0)
MCHC: 34.8 g/dL (ref 30.0–36.0)
MCV: 95.8 fL (ref 80.0–100.0)
Monocytes Absolute: 0.8 10*3/uL (ref 0.1–1.0)
Monocytes Relative: 13 %
Neutro Abs: 4.3 10*3/uL (ref 1.7–7.7)
Neutrophils Relative %: 68 %
Platelet Count: 275 10*3/uL (ref 150–400)
RBC: 3.84 MIL/uL — ABNORMAL LOW (ref 4.22–5.81)
RDW: 13.4 % (ref 11.5–15.5)
WBC Count: 6.3 10*3/uL (ref 4.0–10.5)
nRBC: 0 % (ref 0.0–0.2)

## 2022-02-19 LAB — CMP (CANCER CENTER ONLY)
ALT: 9 U/L (ref 0–44)
AST: 13 U/L — ABNORMAL LOW (ref 15–41)
Albumin: 4.2 g/dL (ref 3.5–5.0)
Alkaline Phosphatase: 26 U/L — ABNORMAL LOW (ref 38–126)
Anion gap: 5 (ref 5–15)
BUN: 15 mg/dL (ref 8–23)
CO2: 28 mmol/L (ref 22–32)
Calcium: 9.8 mg/dL (ref 8.9–10.3)
Chloride: 105 mmol/L (ref 98–111)
Creatinine: 0.89 mg/dL (ref 0.61–1.24)
GFR, Estimated: >60 mL/min (ref >60)
Glucose, Bld: 99 mg/dL (ref 70–99)
Potassium: 3.8 mmol/L (ref 3.5–5.1)
Sodium: 138 mmol/L (ref 135–145)
Total Bilirubin: 0.7 mg/dL (ref 0.3–1.2)
Total Protein: 6.4 g/dL — ABNORMAL LOW (ref 6.5–8.1)

## 2022-02-19 MED ORDER — ACETAMINOPHEN 325 MG PO TABS
650.0000 mg | ORAL_TABLET | Freq: Once | ORAL | Status: AC
  Administered 2022-02-19: 650 mg via ORAL

## 2022-02-19 MED ORDER — DIPHENHYDRAMINE HCL 25 MG PO CAPS
50.0000 mg | ORAL_CAPSULE | Freq: Once | ORAL | Status: AC
  Administered 2022-02-19: 50 mg via ORAL

## 2022-02-19 MED ORDER — DEXAMETHASONE 4 MG PO TABS
20.0000 mg | ORAL_TABLET | Freq: Once | ORAL | Status: AC
  Administered 2022-02-19: 20 mg via ORAL

## 2022-02-19 MED ORDER — DARATUMUMAB-HYALURONIDASE-FIHJ 1800-30000 MG-UT/15ML ~~LOC~~ SOLN
1800.0000 mg | Freq: Once | SUBCUTANEOUS | Status: AC
  Administered 2022-02-19: 1800 mg via SUBCUTANEOUS
  Filled 2022-02-19: qty 15

## 2022-02-19 NOTE — Patient Instructions (Signed)
[facility]  Discharge Instructions: Thank you for choosing [facility] to provide your oncology and hematology care.   If you have a lab appointment with the [facility], please go directly to the [facility] and check in at the registration area.   Wear comfortable clothing and clothing appropriate for easy access to any Portacath or PICC line.   We strive to give you quality time with your provider. You may need to reschedule your appointment if you arrive late (15 or more minutes).  Arriving late affects you and other patients whose appointments are after yours.  Also, if you miss three or more appointments without notifying the office, you may be dismissed from the clinic at the provider's discretion.      For prescription refill requests, have your pharmacy contact our office and allow 72 hours for refills to be completed.    Today you received the following chemotherapy and/or immunotherapy agents darzalex faspro      To help prevent nausea and vomiting after your treatment, we encourage you to take your nausea medication as directed.  BELOW ARE SYMPTOMS THAT SHOULD BE REPORTED IMMEDIATELY: *FEVER GREATER THAN 100.4 F (38 C) OR HIGHER *CHILLS OR SWEATING *NAUSEA AND VOMITING THAT IS NOT CONTROLLED WITH YOUR NAUSEA MEDICATION *UNUSUAL SHORTNESS OF BREATH *UNUSUAL BRUISING OR BLEEDING *URINARY PROBLEMS (pain or burning when urinating, or frequent urination) *BOWEL PROBLEMS (unusual diarrhea, constipation, pain near the anus) TENDERNESS IN MOUTH AND THROAT WITH OR WITHOUT PRESENCE OF ULCERS (sore throat, sores in mouth, or a toothache) UNUSUAL RASH, SWELLING OR PAIN  UNUSUAL VAGINAL DISCHARGE OR ITCHING   Items with * indicate a potential emergency and should be followed up as soon as possible or go to the Emergency Department if any problems should occur.  Please show the CHEMOTHERAPY ALERT CARD or IMMUNOTHERAPY ALERT CARD at  check-in to the Emergency Department and triage nurse.  Should you have questions after your visit or need to cancel or reschedule your appointment, please contact [facility]  Dept: [phone]  and follow the prompts.  Office hours are 8:00 a.m. to 4:30 p.m. Monday - Friday. Please note that voicemails left after 4:00 p.m. may not be returned until the following business day.  We are closed weekends and major holidays. You have access to a nurse at all times for urgent questions. Please call the main number to the clinic Dept: [phone] and follow the prompts.   For any non-urgent questions, you may also contact your provider using MyChart. We now offer e-Visits for anyone 32 and older to request care online for non-urgent symptoms. For details visit [email or web address].   Also download the MyChart app! Go to the app store, search "MyChart", open the app, select [facility], and log in with your MyChart username and password.

## 2022-02-19 NOTE — Progress Notes (Incomplete Revision)
  HEMATOLOGY/ONCOLOGY CONSULTATION NOTE  Date of Service: 02/19/2022  Patient Care Team: [name], George, MD as PCP - General (Internal Medicine) Berry, Jonathan J, MD as PCP - Cardiology (Cardiology)  CHIEF COMPLAINTS/PURPOSE OF CONSULTATION:  Kappa Light Chain Multiple Myeloma  HISTORY OF PRESENTING ILLNESS:   Alexander Saunders is a wonderful 71 y.o. male who is here for evaluation and management of multiple myeloma. Patient was following with Dr. Shadad and has been transferred to us. Patient was diagnosed with multiple myeloma in 2018 which relapsed in 2022 with prior treatment as noted below Prior Therapy:   He is status post surgical decompression of an epidural mass on 09/04/2016 and the pathology showed a plasma cell neoplasm.   He is S/P adjuvant radiation therapy to the thoracic spine to be completed on 10/14/2016.   Velcade 1.5 mg/m weekly with dexamethasone 20 mg and Cytoxan 600 mg po. Therapy started on 10/24/2016.    High-dose chemotherapy followed by stem cell transplant: Melphalan 200 mg/m2 completed on 06/17/17 followed by autologous stem cell rescue 06/18/17.  He achieved complete response.     Zometa 4 mg every 3 months started in February 2020.  Last treatment given in January 2022.     Revlimid 10 mg daily for 21 days out of a 28-day started in February 2020.  Therapy discontinued in November 2022 because of worsening anemia.   Carfilzomib 20 mg/m weekly started on February 19, 2021.  Therapy stopped in March 2023 for presumed cardiomyopathy complication.     Daratumumab 1800 mg subcutaneous injection weekly started on February 19, 2021 with Dexamethasone weekly.     Current treatment     Patient is currently on monthly Daratumumab 1800 mg subcutaneous injections per protocol currently every 28 days with Dexamethasone weekly 20 mg.  Patient notes he is doing well overall without any new medical concern since his last visit with Dr. Shadad. He complains  of weight gain from Dexamethasone, which he is currently trying to loss. He denies any toxicities with Daratumumab.   Patient notes that he tested positive for COVID-19 around 2-3 months and notes that his symptoms have improved since.  He denies fever, chills, night sweats, abdominal pain, back pain, chest pain, or leg swelling. However, he does complain of intermittent hip pain.  He has received his influenza vaccine, but denies COVID-19 Booster ad RSV vaccine.    MEDICAL HISTORY:  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   Stem cells transplant status (HCC) 06/2017    SURGICAL HISTORY: Past Surgical History:  Procedure Laterality Date   APPENDECTOMY     BONE MARROW BIOPSY  07/2018   COLONOSCOPY     KNEE ARTHROSCOPY     r/knee   LAMINECTOMY N/A 09/04/2016   Procedure: THORACIC FOUR-SIX LAMINECTOMY FOR RESECTION OF TUMOR;  Surgeon: Ditty, Benjamin Jared, MD;  Location: [facility] OR;  Service: Neurosurgery;  Laterality: N/A;    SOCIAL HISTORY: Social History   Socioeconomic History   Marital status: Married    Spouse name: Not on file   Number   of children: Not on file   Years of education: Not on file   Highest education level: Not on file  Occupational History   Not on file  Tobacco Use   Smoking status: Never   Smokeless tobacco: Never  Vaping Use   Vaping Use: Never used  Substance and Sexual Activity   Alcohol use: Yes    Comment: occ   Drug use: Never   Sexual activity: Not on file  Other Topics Concern   Not on file  Social History Narrative   He worked as a security director    Highest level of education:  college   He is back in college at [employer] studying exercise physiology.       Right handed      Single story home   Social Determinants of Health   Financial Resource Strain: Not on file  Food Insecurity: No Food Insecurity (09/11/2021)   Hunger Vital Sign    Worried About Running Out of Food in the Last Year: Never true    Ran Out of Food in the Last Year: Never true  Transportation Needs: No Transportation Needs (09/11/2021)   PRAPARE - Transportation    Lack of Transportation (Medical): No    Lack of Transportation (Non-Medical): No  Physical Activity: Not on file  Stress: Not on file  Social Connections: Not on file  Intimate Partner Violence: Not on file    FAMILY HISTORY: Family History  Problem Relation Age of Onset   Hypertension Mother    Cancer Mother    Lung cancer Mother    Hypertension Father    Colon cancer Neg Hx    Colon polyps Neg Hx    Esophageal cancer Neg Hx    Rectal cancer Neg Hx    Stomach cancer Neg Hx     ALLERGIES:  is allergic to jardiance [empagliflozin].  MEDICATIONS:  Current Outpatient Medications  Medication Sig Dispense Refill   acyclovir (ZOVIRAX) 400 MG tablet Take 1 tablet (400 mg total) by mouth daily. 90 tablet 3   aspirin EC 81 MG tablet Take 81 mg by mouth daily.     B Complex Vitamins (B-COMPLEX/B-12) TABS Take 1 tablet by mouth daily.     bisacodyl (DULCOLAX) 5 MG EC tablet Take 5 mg by mouth daily as needed for moderate constipation.     carboxymethylcellulose (REFRESH PLUS) 0.5 % SOLN Place 1 drop into both eyes 3 (three) times daily as needed (dry eyes).     carvedilol (COREG) 12.5 MG tablet Take 1 tablet (12.5 mg total) by mouth 2 (two) times daily. 180 tablet 3   cholecalciferol (VITAMIN D3) 25 MCG (1000 UNIT) tablet Take 1,000 Units by mouth daily.     diphenhydramine-acetaminophen (TYLENOL PM EXTRA STRENGTH) 25-500 MG TABS tablet Take 1 tablet by mouth at bedtime as needed (pain).     gabapentin  (NEURONTIN) 300 MG capsule TAKE 3 CAPSULES BY MOUTH THREE TIMES DAILY IN THE MORNING AND IN THE AFTERNOON AND AT NIGHT 270 capsule 3   losartan (COZAAR) 50 MG tablet Take 1 tablet (50 mg total) by mouth daily. 90 tablet 1   Multiple Vitamin (MULTIVITAMIN) capsule Take 1 capsule by mouth daily.     sildenafil (VIAGRA) 50 MG tablet daily as needed. (Patient not taking: Reported on 10/08/2021)     sodium chloride (OCEAN) 0.65 % SOLN nasal spray Place 1 spray into both nostrils daily as needed for congestion.     spironolactone (ALDACTONE) 25 MG tablet Take   1 tablet (25 mg total) by mouth daily. 90 tablet 3   traMADol (ULTRAM) 50 MG tablet Take 1 tablet (50 mg total) by mouth every 6 (six) hours as needed. 30 tablet 1   No current facility-administered medications for this visit.    REVIEW OF SYSTEMS:    10 Point review of Systems was done is negative except as noted above.  PHYSICAL EXAMINATION: ECOG PERFORMANCE STATUS: 1 - Symptomatic but completely ambulatory  . Vitals:   02/19/22 1250  BP: 130/80  Pulse: 76  Resp: 20  Temp: (!) 97.5 F (36.4 C)  SpO2: 100%   Filed Weights   02/19/22 1250  Weight: 207 lb 1.6 oz (93.9 kg)   .Body mass index is 28.09 kg/m.  GENERAL:alert, in no acute distress and comfortable SKIN: no acute rashes, no significant lesions EYES: conjunctiva are pink and non-injected, sclera anicteric OROPHARYNX: MMM, no exudates, no oropharyngeal erythema or ulceration NECK: supple, no JVD LYMPH:  no palpable lymphadenopathy in the cervical, axillary or inguinal regions LUNGS: clear to auscultation b/l with normal respiratory effort HEART: regular rate & rhythm ABDOMEN:  normoactive bowel sounds , non tender, not distended. Extremity: no pedal edema PSYCH: alert & oriented x 3 with fluent speech NEURO: no focal motor/sensory deficits  LABORATORY DATA:  I have reviewed the data as listed .    Latest Ref Rng & Units 02/19/2022   12:05 PM 01/22/2022   11:05  AM 12/25/2021   11:20 AM  CBC  WBC 4.0 - 10.5 K/uL 6.3  5.9  6.8   Hemoglobin 13.0 - 17.0 g/dL 12.8  12.6  12.8   Hematocrit 39.0 - 52.0 % 36.8  36.9  36.7   Platelets 150 - 400 K/uL 275  257  296    .    Latest Ref Rng & Units 02/19/2022   12:05 PM 01/22/2022   11:05 AM 12/25/2021   11:20 AM  CMP  Glucose 70 - 99 mg/dL 99  122  106   BUN 8 - 23 mg/dL 15  25  17   Creatinine 0.61 - 1.24 mg/dL 0.89  0.94  0.96   Sodium 135 - 145 mmol/L 138  139  138   Potassium 3.5 - 5.1 mmol/L 3.8  3.9  4.0   Chloride 98 - 111 mmol/L 105  106  105   CO2 22 - 32 mmol/L 28  29  27   Calcium 8.9 - 10.3 mg/dL 9.8  9.7  9.5   Total Protein 6.5 - 8.1 g/dL 6.4  6.4  7.1   Total Bilirubin 0.3 - 1.2 mg/dL 0.7  0.4  0.8   Alkaline Phos 38 - 126 U/L 26  25  29   AST 15 - 41 U/L 13  13  14   ALT 0 - 44 U/L 9  10  9     Surgical Pathology  THIS IS AN ADDENDUM REPORT  CASE: [id number] PATIENT: Alexander Saunders Bone Marrow Report Addendum   Reason for Addendum #1:  Additional special stains Reason for Addendum #2:  Cytogenetics results  Clinical History: multiple myeloma   DIAGNOSIS:  BONE MARROW, ASPIRATE, CLOT, CORE: -  Hypercellular marrow involved by plasma cell myeloma -  See comment  PERIPHERAL BLOOD: -  Macrocytic anemia -  See complete blood cell count  COMMENT:  The findings are consistent with recurrence of the patient's previously diagnosed plasma cell myeloma.  For completeness, immunohistochemistry (CD138, kappa and lambda by in situ hybridization) are   pending and will be reported in an addendum.  MICROSCOPIC DESCRIPTION:  PERIPHERAL BLOOD SMEAR: The peripheral blood has a macrocytic anemia. There is no significant rouleaux formation identified.  Plasmacytoid lymphocytes are seen.  Platelets are within normal limits.  BONE MARROW ASPIRATE: Spicular, cellular and adequate for evaluation Erythroid precursors: Diminished numbers, no overt dysplasia Granulocytic  precursors: Diminished numbers, no overt dysplasia Megakaryocytes: Present Lymphocytes/plasma cells: There is a marked increase of atypical plasma cells.  The plasma cells are enlarged and multinucleated.  There is no lymphocytosis.  TOUCH PREPARATIONS: No additional findings as compared to the aspirate smears  CLOT AND BIOPSY: Examination of the bone marrow core biopsy and clot section reveals a hypercellular marrow for age (> 95%) involved by plasma cell neoplasm.  Background hematopoiesis is markedly diminished. Megakaryocytes are present.  There are no granulomas identified.  IRON STAIN: Iron stains are performed on a bone marrow aspirate or touch imprint smear and section of clot. The controls stained appropriately.       Storage Iron: Present (aspirate smears)      Ring Sideroblasts: Not identified    RADIOGRAPHIC STUDIES: I have personally reviewed the radiological images as listed and agreed with the findings in the report. No results found. Pet/ct 12/2020  IMPRESSION: 1. No evidence of FDG avid nodal or visceral metastatic disease in the chest, abdomen or pelvis. 2. Numerous osseous lytic lesions are similar in appearance when compared with 2018 prior. No focal FDG uptake above background marrow activity. Findings are compatible with history of multiple myeloma. 3. Coronary artery calcifications of the LAD and circumflex. 4. Mild aortic Atherosclerosis (ICD10-I70.0).     Electronically Signed   By: Leah  Strickland M.D.   On: 12/28/2020 12:34   Pet/ct 03/16/2017 Impression   1.  Focal increased FDG uptake in the spinous process of T3 is consistent with site of multiple myeloma. 2.  No additional sites of increased metabolic activity to suggest cluster of myelomatous cells. 3.  Innumerable lytic lesions in the axial skeleton.    ASSESSMENT & PLAN:   Multiple Myeloma -First diagnosed in 2018 an relapsed in 2022.  Cytogenetisc normal male karyotype Mol Cy  - not done  PLAN: -reviewed patient medical record and confirmed his oncologic hx in details -discussed lab results from today, 02/19/2022, with the patient. CBC shows slightly decreased hemoglobin of 12.8 K and hematocrit of 36.8. CMP is stable.  -Discussed the recent Myeloma panel from 01/22/2022 which showed undetectable M-protein and Kappa, lambda light chain ratio was around 1.68.  -Discussed the option of slowly dropping dexamethasone if the patient has not had any toxicities with Daratumumab.  -Recommended RSV vaccine and COVID-19 Booster.  -continue multiple daratumumab [place] maintenance at this time.  FOLLOW-UP: Per integrated scheduling  All .The total time spent in the appointment was 45 minutes* .  All of the patient's questions were answered with apparent satisfaction. The patient knows to call the clinic with any problems, questions or concerns.   Zakyia Gagan MD MS AAHIVMS [facility] C[facility] Hematology/Oncology Physician [facility]  .*Total Encounter Time as defined by the Centers for Medicare and Medicaid Services includes, in addition to the face-to-face time of a patient visit (documented in the note above) non-face-to-face time: obtaining and reviewing outside history, ordering and reviewing medications, tests or procedures, care coordination (communications with other health care professionals or caregivers) and documentation in the medical record.   02/19/2022 12:06 PM    I, Param Shah, am acting as a   scribe for Hiba Garry, MD. .I have reviewed the above documentation for accuracy and completeness, and I agree with the above. .Pixie Burgener Kishore Johncharles Fusselman MD  

## 2022-02-19 NOTE — Progress Notes (Addendum)
HEMATOLOGY/ONCOLOGY CONSULTATION NOTE  Date of Service: 02/19/2022  Patient Care Team: Benito Mccreedy, MD as PCP - General (Internal Medicine) Lorretta Harp, MD as PCP - Cardiology (Cardiology)  CHIEF COMPLAINTS/PURPOSE OF CONSULTATION:  Alexander Saunders Multiple Myeloma  HISTORY OF PRESENTING ILLNESS:   Alexander Saunders is a wonderful 72 y.o. male who is here for evaluation and management of multiple myeloma. Patient was following with Dr. Alen Blew and has been transferred to [place]. Patient was diagnosed with multiple myeloma in 2018 which relapsed in 2022 with prior treatment as noted below Prior Therapy:   He is status post surgical decompression of an epidural mass on 09/04/2016 and the pathology showed a plasma cell neoplasm.   He is S/P adjuvant radiation therapy to the thoracic spine to be completed on 10/14/2016.   Velcade 1.5 mg/m weekly with dexamethasone 20 mg and Cytoxan 600 mg po. Therapy started on 10/24/2016.    High-dose chemotherapy followed by stem cell transplant: Melphalan 200 mg/m2 completed on 06/17/17 followed by autologous stem cell rescue 06/18/17.  He achieved complete response.     Zometa 4 mg every 3 months started in February 2020.  Last treatment given in January 2022.     Revlimid 10 mg daily for 21 days out of a 28-day started in February 2020.  Therapy discontinued in November 2022 because of worsening anemia.   Carfilzomib 20 mg/m weekly started on February 19, 2021.  Therapy stopped in March 2023 for presumed cardiomyopathy complication.     Daratumumab 1800 mg subcutaneous injection weekly started on February 19, 2021 with Dexamethasone weekly.     Current treatment     Patient is currently on monthly Daratumumab 1800 mg subcutaneous injections per protocol currently every 28 days with Dexamethasone weekly 20 mg.  Patient notes he is doing well overall without any new medical concern since his last visit with Dr. Alen Blew. He complains  of weight gain from Dexamethasone, which he is currently trying to loss. He denies any toxicities with Daratumumab.   Patient notes that he tested positive for COVID-19 around 2-3 months and notes that his symptoms have improved since.  He denies fever, chills, night sweats, abdominal pain, back pain, chest pain, or leg swelling. However, he does complain of intermittent hip pain.  He has received his influenza vaccine, but denies COVID-19 Booster ad RSV vaccine.    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   Stem cells transplant status ([place]) 06/2017    SURGICAL HISTORY: Past Surgical History:  Procedure Laterality Date   APPENDECTOMY     BONE MARROW BIOPSY  07/2018   COLONOSCOPY     KNEE ARTHROSCOPY     r/knee   LAMINECTOMY N/A 09/04/2016   Procedure: THORACIC FOUR-SIX LAMINECTOMY FOR RESECTION OF TUMOR;  Surgeon: Ditty, Kevan Ny, MD;  Location: [facility];  Service: Neurosurgery;  Laterality: N/A;    SOCIAL HISTORY: Social History   Socioeconomic History   Marital status: Married    Spouse name: Not on file   Number  of children: Not on file   Years of education: Not on file   Highest education level: Not on file  Occupational History   Not on file  Tobacco Use   Smoking status: Never   Smokeless tobacco: Never  Vaping Use   Vaping Use: Never used  Substance and Sexual Activity   Alcohol use: Yes    Comment: occ   Drug use: Never   Sexual activity: Not on file  Other Topics Concern   Not on file  Social History Narrative   He worked as a Stage manager level of education:  college   He is back in college at [employer] studying exercise physiology.       Right handed      Single story home   Social Determinants of Health   Financial Resource Strain: Not on file  Food Insecurity: No Food Insecurity (09/11/2021)   Hunger Vital Sign    Worried About Running Out of Food in the Last Year: Never true    Ran Out of Food in the Last Year: Never true  Transportation Needs: No Transportation Needs (09/11/2021)   PRAPARE - Hydrologist (Medical): No    Lack of Transportation (Non-Medical): No  Physical Activity: Not on file  Stress: Not on file  Social Connections: Not on file  Intimate Partner Violence: Not on file    FAMILY HISTORY: Family History  Problem Relation Age of Onset   Hypertension Mother    Cancer Mother    Lung cancer Mother    Hypertension Father    Colon cancer Neg Hx    Colon polyps Neg Hx    Esophageal cancer Neg Hx    Rectal cancer Neg Hx    Stomach cancer Neg Hx     ALLERGIES:  is allergic to jardiance [empagliflozin].  MEDICATIONS:  Current Outpatient Medications  Medication Sig Dispense Refill   acyclovir (ZOVIRAX) 400 MG tablet Take 1 tablet (400 mg total) by mouth daily. 90 tablet 3   aspirin EC 81 MG tablet Take 81 mg by mouth daily.     B Complex Vitamins (B-COMPLEX/B-12) TABS Take 1 tablet by mouth daily.     bisacodyl (DULCOLAX) 5 MG EC tablet Take 5 mg by mouth daily as needed for moderate constipation.     carboxymethylcellulose (REFRESH PLUS) 0.5 % SOLN Place 1 drop into both eyes 3 (three) times daily as needed (dry eyes).     carvedilol (COREG) 12.5 MG tablet Take 1 tablet (12.5 mg total) by mouth 2 (two) times daily. 180 tablet 3   cholecalciferol (VITAMIN D3) 25 MCG (1000 UNIT) tablet Take 1,000 Units by mouth daily.     diphenhydramine-acetaminophen (TYLENOL PM EXTRA STRENGTH) 25-500 MG TABS tablet Take 1 tablet by mouth at bedtime as needed (pain).     gabapentin  (NEURONTIN) 300 MG capsule TAKE 3 CAPSULES BY MOUTH THREE TIMES DAILY IN THE MORNING AND IN THE AFTERNOON AND AT NIGHT 270 capsule 3   losartan (COZAAR) 50 MG tablet Take 1 tablet (50 mg total) by mouth daily. 90 tablet 1   Multiple Vitamin (MULTIVITAMIN) capsule Take 1 capsule by mouth daily.     sildenafil (VIAGRA) 50 MG tablet daily as needed. (Patient not taking: Reported on 10/08/2021)     sodium chloride (OCEAN) 0.65 % SOLN nasal spray Place 1 spray into both nostrils daily as needed for congestion.     spironolactone (ALDACTONE) 25 MG tablet Take  1 tablet (25 mg total) by mouth daily. 90 tablet 3   traMADol (ULTRAM) 50 MG tablet Take 1 tablet (50 mg total) by mouth every 6 (six) hours as needed. 30 tablet 1   No current facility-administered medications for this visit.    REVIEW OF SYSTEMS:    10 Point review of Systems was done is negative except as noted above.  PHYSICAL EXAMINATION: ECOG PERFORMANCE STATUS: 1 - Symptomatic but completely ambulatory  . Vitals:   02/19/22 1250  BP: 130/80  Pulse: 76  Resp: 20  Temp: (!) 97.5 F (36.4 C)  SpO2: 100%   Filed Weights   02/19/22 1250  Weight: 207 lb 1.6 oz (93.9 kg)   .Body mass index is 28.09 kg/m.  GENERAL:alert, in no acute distress and comfortable SKIN: no acute rashes, no significant lesions EYES: conjunctiva are pink and non-injected, sclera anicteric OROPHARYNX: MMM, no exudates, no oropharyngeal erythema or ulceration NECK: supple, no JVD LYMPH:  no palpable lymphadenopathy in the cervical, axillary or inguinal regions LUNGS: clear to auscultation b/l with normal respiratory effort HEART: regular rate & rhythm ABDOMEN:  normoactive bowel sounds , non tender, not distended. Extremity: no pedal edema PSYCH: alert & oriented x 3 with fluent speech NEURO: no focal motor/sensory deficits  LABORATORY DATA:  I have reviewed the data as listed .    Latest Ref Rng & Units 02/19/2022   12:05 PM 01/22/2022   11:05  AM 12/25/2021   11:20 AM  CBC  WBC 4.0 - 10.5 K/uL 6.3  5.9  6.8   Hemoglobin 13.0 - 17.0 g/dL 12.8  12.6  12.8   Hematocrit 39.0 - 52.0 % 36.8  36.9  36.7   Platelets 150 - 400 K/uL 275  257  296    .    Latest Ref Rng & Units 02/19/2022   12:05 PM 01/22/2022   11:05 AM 12/25/2021   11:20 AM  CMP  Glucose 70 - 99 mg/dL 99  122  106   BUN 8 - 23 mg/dL 15  25  17   $ Creatinine 0.61 - 1.24 mg/dL 0.89  0.94  0.96   Sodium 135 - 145 mmol/L 138  139  138   Potassium 3.5 - 5.1 mmol/L 3.8  3.9  4.0   Chloride 98 - 111 mmol/L 105  106  105   CO2 22 - 32 mmol/L 28  29  27   $ Calcium 8.9 - 10.3 mg/dL 9.8  9.7  9.5   Total Protein 6.5 - 8.1 g/dL 6.4  6.4  7.1   Total Bilirubin 0.3 - 1.2 mg/dL 0.7  0.4  0.8   Alkaline Phos 38 - 126 U/L 26  25  29   $ AST 15 - 41 U/L 13  13  14   $ ALT 0 - 44 U/L 9  10  9     $ Surgical Pathology  THIS IS AN ADDENDUM REPORT  CASE: [id number] PATIENT: Maxeys Bone Marrow Report Addendum   Reason for Addendum #1:  Additional special stains Reason for Addendum #2:  Cytogenetics results  Clinical History: multiple myeloma   DIAGNOSIS:  BONE MARROW, ASPIRATE, CLOT, CORE: -  Hypercellular marrow involved by plasma cell myeloma -  See comment  PERIPHERAL BLOOD: -  Macrocytic anemia -  See complete blood cell count  COMMENT:  The findings are consistent with recurrence of the patient's previously diagnosed plasma cell myeloma.  For completeness, immunohistochemistry (CD138, kappa and lambda by in situ hybridization) are  pending and will be reported in an addendum.  MICROSCOPIC DESCRIPTION:  PERIPHERAL BLOOD SMEAR: The peripheral blood has a macrocytic anemia. There is no significant rouleaux formation identified.  Plasmacytoid lymphocytes are seen.  Platelets are within normal limits.  BONE MARROW ASPIRATE: Spicular, cellular and adequate for evaluation Erythroid precursors: Diminished numbers, no overt dysplasia Granulocytic  precursors: Diminished numbers, no overt dysplasia Megakaryocytes: Present Lymphocytes/plasma cells: There is a marked increase of atypical plasma cells.  The plasma cells are enlarged and multinucleated.  There is no lymphocytosis.  TOUCH PREPARATIONS: No additional findings as compared to the aspirate smears  CLOT AND BIOPSY: Examination of the bone marrow core biopsy and clot section reveals a hypercellular marrow for age (> 95%) involved by plasma cell neoplasm.  Background hematopoiesis is markedly diminished. Megakaryocytes are present.  There are no granulomas identified.  IRON STAIN: Iron stains are performed on a bone marrow aspirate or touch imprint smear and section of clot. The controls stained appropriately.       Storage Iron: Present (aspirate smears)      Ring Sideroblasts: Not identified    RADIOGRAPHIC STUDIES: I have personally reviewed the radiological images as listed and agreed with the findings in the report. No results found. Pet/ct 12/2020  IMPRESSION: 1. No evidence of FDG avid nodal or visceral metastatic disease in the chest, abdomen or pelvis. 2. Numerous osseous lytic lesions are similar in appearance when compared with 2018 prior. No focal FDG uptake above background marrow activity. Findings are compatible with history of multiple myeloma. 3. Coronary artery calcifications of the LAD and circumflex. 4. Mild aortic Atherosclerosis (ICD10-I70.0).     Electronically Signed   By: Yetta Glassman M.D.   On: 12/28/2020 12:34   Pet/ct 03/16/2017 Impression   1.  Focal increased FDG uptake in the spinous process of T3 is consistent with site of multiple myeloma. 2.  No additional sites of increased metabolic activity to suggest cluster of myelomatous cells. 3.  Innumerable lytic lesions in the axial skeleton.    ASSESSMENT & PLAN:   Multiple Myeloma -First diagnosed in 2018 an relapsed in 2022.  Cytogenetisc normal male karyotype Mol Cy  - not done  PLAN: -reviewed patient medical record and confirmed his oncologic hx in details -discussed lab results from today, 02/19/2022, with the patient. CBC shows slightly decreased hemoglobin of 12.8 K and hematocrit of 36.8. CMP is stable.  -Discussed the recent Myeloma panel from 01/22/2022 which showed undetectable M-protein and Kappa, lambda light Saunders ratio was around 1.68.  -Discussed the option of slowly dropping dexamethasone if the patient has not had any toxicities with Daratumumab.  -Recommended RSV vaccine and COVID-19 Booster.  -continue multiple daratumumab [place] maintenance at this time.  FOLLOW-UP: Per integrated scheduling  All .The total time spent in the appointment was 45 minutes* .  All of the patient's questions were answered with apparent satisfaction. The patient knows to call the clinic with any problems, questions or concerns.   Sullivan Lone MD MS AAHIVMS [facility] Hematology/Oncology Physician [facility]  .*Total Encounter Time as defined by the Centers for Medicare and Medicaid Services includes, in addition to the face-to-face time of a patient visit (documented in the note above) non-face-to-face time: obtaining and reviewing outside history, ordering and reviewing medications, tests or procedures, care coordination (communications with other health care professionals or caregivers) and documentation in the medical record.   02/19/2022 12:06 PM    I, Cleda Mccreedy, am acting as a  scribe for Sullivan Lone, MD. .I have reviewed the above documentation for accuracy and completeness, and I agree with the above. Brunetta Genera MD

## 2022-02-20 LAB — KAPPA/LAMBDA LIGHT CHAINS
Kappa free light chain: 8.4 mg/L (ref 3.3–19.4)
Kappa, lambda light chain ratio: 1.5 (ref 0.26–1.65)
Lambda free light chains: 5.6 mg/L — ABNORMAL LOW (ref 5.7–26.3)

## 2022-02-21 ENCOUNTER — Other Ambulatory Visit: Payer: Self-pay

## 2022-02-25 LAB — MULTIPLE MYELOMA PANEL, SERUM
Albumin SerPl Elph-Mcnc: 4 g/dL (ref 2.9–4.4)
Albumin/Glob SerPl: 1.8 — ABNORMAL HIGH (ref 0.7–1.7)
Alpha 1: 0.2 g/dL (ref 0.0–0.4)
Alpha2 Glob SerPl Elph-Mcnc: 0.7 g/dL (ref 0.4–1.0)
B-Globulin SerPl Elph-Mcnc: 1 g/dL (ref 0.7–1.3)
Gamma Glob SerPl Elph-Mcnc: 0.5 g/dL (ref 0.4–1.8)
Globulin, Total: 2.3 g/dL (ref 2.2–3.9)
IgA: 30 mg/dL — ABNORMAL LOW (ref 61–437)
IgG (Immunoglobin G), Serum: 619 mg/dL (ref 603–1613)
IgM (Immunoglobulin M), Srm: 10 mg/dL — ABNORMAL LOW (ref 15–143)
Total Protein ELP: 6.3 g/dL (ref 6.0–8.5)

## 2022-02-26 ENCOUNTER — Encounter: Payer: Self-pay | Admitting: Oncology

## 2022-03-11 ENCOUNTER — Encounter: Payer: Self-pay | Admitting: Hematology

## 2022-03-19 ENCOUNTER — Other Ambulatory Visit: Payer: Self-pay | Admitting: Hematology

## 2022-03-19 ENCOUNTER — Encounter: Payer: Self-pay | Admitting: Pharmacist

## 2022-03-19 DIAGNOSIS — C9000 Multiple myeloma not having achieved remission: Secondary | ICD-10-CM

## 2022-03-19 DIAGNOSIS — I5020 Unspecified systolic (congestive) heart failure: Secondary | ICD-10-CM

## 2022-03-19 MED ORDER — LOSARTAN POTASSIUM 50 MG PO TABS: 50.0000 mg | ORAL_TABLET | Freq: Every day | ORAL | 1 refills | Status: DC

## 2022-03-20 ENCOUNTER — Other Ambulatory Visit: Payer: Self-pay

## 2022-03-25 ENCOUNTER — Encounter: Payer: Self-pay | Admitting: Gastroenterology

## 2022-03-26 ENCOUNTER — Inpatient Hospital Stay (HOSPITAL_BASED_OUTPATIENT_CLINIC_OR_DEPARTMENT_OTHER): Payer: Medicare Other | Admitting: Hematology

## 2022-03-26 ENCOUNTER — Inpatient Hospital Stay: Payer: Medicare Other

## 2022-03-26 ENCOUNTER — Inpatient Hospital Stay: Payer: Medicare Other | Attending: Oncology

## 2022-03-26 VITALS — BP 141/70 | HR 61 | Temp 98.8°F | Resp 18 | Wt 207.0 lb

## 2022-03-26 DIAGNOSIS — C9000 Multiple myeloma not having achieved remission: Secondary | ICD-10-CM

## 2022-03-26 DIAGNOSIS — C9002 Multiple myeloma in relapse: Secondary | ICD-10-CM | POA: Insufficient documentation

## 2022-03-26 DIAGNOSIS — Z5112 Encounter for antineoplastic immunotherapy: Secondary | ICD-10-CM | POA: Diagnosis not present

## 2022-03-26 LAB — CBC WITH DIFFERENTIAL (CANCER CENTER ONLY)
Abs Immature Granulocytes: 0.02 10*3/uL (ref 0.00–0.07)
Basophils Absolute: 0.1 10*3/uL (ref 0.0–0.1)
Basophils Relative: 1 %
Eosinophils Absolute: 0.5 10*3/uL (ref 0.0–0.5)
Eosinophils Relative: 7 %
HCT: 38.6 % — ABNORMAL LOW (ref 39.0–52.0)
Hemoglobin: 13.5 g/dL (ref 13.0–17.0)
Immature Granulocytes: 0 %
Lymphocytes Relative: 12 %
Lymphs Abs: 0.8 10*3/uL (ref 0.7–4.0)
MCH: 34.1 pg — ABNORMAL HIGH (ref 26.0–34.0)
MCHC: 35 g/dL (ref 30.0–36.0)
MCV: 97.5 fL (ref 80.0–100.0)
Monocytes Absolute: 0.8 10*3/uL (ref 0.1–1.0)
Monocytes Relative: 12 %
Neutro Abs: 4.7 10*3/uL (ref 1.7–7.7)
Neutrophils Relative %: 68 %
Platelet Count: 283 10*3/uL (ref 150–400)
RBC: 3.96 MIL/uL — ABNORMAL LOW (ref 4.22–5.81)
RDW: 13.2 % (ref 11.5–15.5)
WBC Count: 6.9 10*3/uL (ref 4.0–10.5)
nRBC: 0 % (ref 0.0–0.2)

## 2022-03-26 LAB — CMP (CANCER CENTER ONLY)
ALT: 10 U/L (ref 0–44)
AST: 13 U/L — ABNORMAL LOW (ref 15–41)
Albumin: 4.4 g/dL (ref 3.5–5.0)
Alkaline Phosphatase: 33 U/L — ABNORMAL LOW (ref 38–126)
Anion gap: 6 (ref 5–15)
BUN: 17 mg/dL (ref 8–23)
CO2: 29 mmol/L (ref 22–32)
Calcium: 9.6 mg/dL (ref 8.9–10.3)
Chloride: 107 mmol/L (ref 98–111)
Creatinine: 0.93 mg/dL (ref 0.61–1.24)
GFR, Estimated: >60 mL/min (ref >60)
Glucose, Bld: 103 mg/dL — ABNORMAL HIGH (ref 70–99)
Potassium: 3.8 mmol/L (ref 3.5–5.1)
Sodium: 142 mmol/L (ref 135–145)
Total Bilirubin: 0.5 mg/dL (ref 0.3–1.2)
Total Protein: 6.7 g/dL (ref 6.5–8.1)

## 2022-03-26 MED ORDER — DIPHENHYDRAMINE HCL 25 MG PO CAPS
50.0000 mg | ORAL_CAPSULE | Freq: Once | ORAL | Status: AC
  Administered 2022-03-26: 50 mg via ORAL
  Filled 2022-03-26: qty 2

## 2022-03-26 MED ORDER — ACETAMINOPHEN 325 MG PO TABS
650.0000 mg | ORAL_TABLET | Freq: Once | ORAL | Status: AC
  Administered 2022-03-26: 650 mg via ORAL
  Filled 2022-03-26: qty 2

## 2022-03-26 MED ORDER — DEXAMETHASONE 4 MG PO TABS
20.0000 mg | ORAL_TABLET | Freq: Once | ORAL | Status: AC
  Administered 2022-03-26: 20 mg via ORAL
  Filled 2022-03-26: qty 5

## 2022-03-26 MED ORDER — DARATUMUMAB-HYALURONIDASE-FIHJ 1800-30000 MG-UT/15ML ~~LOC~~ SOLN
1800.0000 mg | Freq: Once | SUBCUTANEOUS | Status: AC
  Administered 2022-03-26: 1800 mg via SUBCUTANEOUS
  Filled 2022-03-26: qty 15

## 2022-03-26 NOTE — Patient Instructions (Signed)
[facility]  Discharge Instructions: Thank you for choosing [facility] to provide your oncology and hematology care.   If you have a lab appointment with the [facility], please go directly to the [facility] and check in at the registration area.   Wear comfortable clothing and clothing appropriate for easy access to any Portacath or PICC line.   We strive to give you quality time with your provider. You may need to reschedule your appointment if you arrive late (15 or more minutes).  Arriving late affects you and other patients whose appointments are after yours.  Also, if you miss three or more appointments without notifying the office, you may be dismissed from the clinic at the provider's discretion.      For prescription refill requests, have your pharmacy contact our office and allow 72 hours for refills to be completed.    Today you received the following chemotherapy and/or immunotherapy agents: Darzalex fasbro      To help prevent nausea and vomiting after your treatment, we encourage you to take your nausea medication as directed.  BELOW ARE SYMPTOMS THAT SHOULD BE REPORTED IMMEDIATELY: *FEVER GREATER THAN 100.4 F (38 C) OR HIGHER *CHILLS OR SWEATING *NAUSEA AND VOMITING THAT IS NOT CONTROLLED WITH YOUR NAUSEA MEDICATION *UNUSUAL SHORTNESS OF BREATH *UNUSUAL BRUISING OR BLEEDING *URINARY PROBLEMS (pain or burning when urinating, or frequent urination) *BOWEL PROBLEMS (unusual diarrhea, constipation, pain near the anus) TENDERNESS IN MOUTH AND THROAT WITH OR WITHOUT PRESENCE OF ULCERS (sore throat, sores in mouth, or a toothache) UNUSUAL RASH, SWELLING OR PAIN  UNUSUAL VAGINAL DISCHARGE OR ITCHING   Items with * indicate a potential emergency and should be followed up as soon as possible or go to the Emergency Department if any problems should occur.  Please show the CHEMOTHERAPY ALERT CARD or IMMUNOTHERAPY ALERT CARD at  check-in to the Emergency Department and triage nurse.  Should you have questions after your visit or need to cancel or reschedule your appointment, please contact [facility]  Dept: [phone]  and follow the prompts.  Office hours are 8:00 a.m. to 4:30 p.m. Monday - Friday. Please note that voicemails left after 4:00 p.m. may not be returned until the following business day.  We are closed weekends and major holidays. You have access to a nurse at all times for urgent questions. Please call the main number to the clinic Dept: [phone] and follow the prompts.   For any non-urgent questions, you may also contact your provider using MyChart. We now offer e-Visits for anyone 70 and older to request care online for non-urgent symptoms. For details visit [email or web address].   Also download the MyChart app! Go to the app store, search "MyChart", open the app, select [facility], and log in with your MyChart username and password.

## 2022-03-26 NOTE — Progress Notes (Signed)
HEMATOLOGY/ONCOLOGY CLINIC NOTE  Date of Service: 03/26/2022  Patient Care Team: Benito Mccreedy, MD as PCP - General (Internal Medicine) Lorretta Harp, MD as PCP - Cardiology (Cardiology)  CHIEF COMPLAINTS/PURPOSE OF CONSULTATION:  Alexander Saunders Multiple Myeloma  HISTORY OF PRESENTING ILLNESS:   Alexander Saunders is a wonderful 72 y.o. male who is here for evaluation and management of multiple myeloma. Patient was following with Dr. Alen Blew and has been transferred to [place]. Patient was diagnosed with multiple myeloma in 2018 which relapsed in 2022 with prior treatment as noted below. Prior Therapy:   He is status post surgical decompression of an epidural mass on 09/04/2016 and the pathology showed a plasma cell neoplasm.   He is S/P adjuvant radiation therapy to the thoracic spine to be completed on 10/14/2016.   Velcade 1.5 mg/m weekly with dexamethasone 20 mg and Cytoxan 600 mg po. Therapy started on 10/24/2016.    High-dose chemotherapy followed by stem cell transplant: Melphalan 200 mg/m2 completed on 06/17/17 followed by autologous stem cell rescue 06/18/17.  He achieved complete response.     Zometa 4 mg every 3 months started in February 2020.  Last treatment given in January 2022.     Revlimid 10 mg daily for 21 days out of a 28-day started in February 2020.  Therapy discontinued in November 2022 because of worsening anemia.   Carfilzomib 20 mg/m weekly started on February 19, 2021.  Therapy stopped in March 2023 for presumed cardiomyopathy complication.     Daratumumab 1800 mg subcutaneous injection weekly started on February 19, 2021 with Dexamethasone weekly.     Current treatment: Patient is currently on monthly Daratumumab 1800 mg subcutaneous injections per protocol currently every 28 days with Dexamethasone weekly 20 mg.  Patient notes he is doing well overall without any new medical concern since his last visit with Dr. Alen Blew. He complains of weight  gain from Dexamethasone, which he is currently trying to loss. He denies any toxicities with Daratumumab.   Patient notes that he tested positive for COVID-19 around 2-3 months and notes that his symptoms have improved since.  He denies fever, chills, night sweats, abdominal pain, back pain, chest pain, or leg swelling. However, he does complain of intermittent hip pain.  He has received his influenza vaccine, but denies COVID-19 Booster ad RSV vaccine.    INTERVAL HISTORY: Alexander Saunders is a wonderful 72 y.o. male who is here for evaluation and management of multiple myeloma.   Patient was last seen by me on 02/19/2022 and he complained of intermittent hip pain but was doing well overall.   Patient reports he has been doing well overall since our last visit. He complains of mild bone pain throughout the body, but not severe. He denies fever, chills, night sweats, infection issues, back pain, chest pain, abdominal pain, or leg swelling.   He has received his influenza vaccine and COVID-19 Booster, but denies RSV vaccine yet.   MEDICAL HISTORY:  Past Medical History:  Diagnosis Date   Anxiety    occasional    Blood transfusion without reported diagnosis 2018   with spinal surgery   History of chemotherapy    completed 06-17-2017   History of radiation therapy    completed 11-2016 per pt   Multiple myeloma ([place]) 2018   currently in remission    Neuromuscular disorder ([place])    neuropathy lower extremeties, primarily right leg    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   Stem cells  transplant status ([place]) 06/2017    SURGICAL HISTORY: Past Surgical History:  Procedure Laterality Date   APPENDECTOMY     BONE MARROW BIOPSY  07/2018   COLONOSCOPY     KNEE ARTHROSCOPY     r/knee   LAMINECTOMY N/A 09/04/2016   Procedure: THORACIC FOUR-SIX LAMINECTOMY FOR RESECTION OF TUMOR;  Surgeon: Ditty, Kevan Ny, MD;  Location: [facility];  Service: Neurosurgery;  Laterality: N/A;    SOCIAL  HISTORY: Social History   Socioeconomic History   Marital status: Married    Spouse name: Not on file   Number of children: Not on file   Years of education: Not on file   Highest education level: Not on file  Occupational History   Not on file  Tobacco Use   Smoking status: Never   Smokeless tobacco: Never  Vaping Use   Vaping Use: Never used  Substance and Sexual Activity   Alcohol use: Yes    Comment: occ   Drug use: Never   Sexual activity: Not on file  Other Topics Concern   Not on file  Social History Narrative   He worked as a Paramedic level of education:  college   He is back in college at [employer] studying exercise physiology.       Right handed      Single story home   Social Determinants of Health   Financial Resource Strain: Not on file  Food Insecurity: No Food Insecurity (09/11/2021)   Hunger Vital Sign    Worried About Running Out of Food in the Last Year: Never true    Ran Out of Food in the Last Year: Never true  Transportation Needs: No Transportation Needs (09/11/2021)   PRAPARE - Hydrologist (Medical): No    Lack of Transportation (Non-Medical): No  Physical Activity: Not on file  Stress: Not on file  Social Connections: Not on file  Intimate Partner Violence: Not on file    FAMILY HISTORY: Family History  Problem Relation Age of Onset   Hypertension Mother    Cancer Mother    Lung cancer Mother    Hypertension Father    Colon cancer Neg Hx    Colon polyps Neg Hx    Esophageal cancer Neg Hx    Rectal cancer Neg Hx    Stomach cancer Neg Hx     ALLERGIES:  is allergic to jardiance [empagliflozin].  MEDICATIONS:  Current Outpatient Medications  Medication Sig Dispense Refill   acyclovir (ZOVIRAX) 400 MG tablet Take 1 tablet (400 mg total) by mouth daily. 90 tablet 3   aspirin EC 81 MG tablet Take 81 mg by mouth daily.     B Complex Vitamins (B-COMPLEX/B-12) TABS Take 1 tablet by mouth daily.      bisacodyl (DULCOLAX) 5 MG EC tablet Take 5 mg by mouth daily as needed for moderate constipation.     carboxymethylcellulose (REFRESH PLUS) 0.5 % SOLN Place 1 drop into both eyes 3 (three) times daily as needed (dry eyes).     carvedilol (COREG) 12.5 MG tablet Take 1 tablet (12.5 mg total) by mouth 2 (two) times daily. 180 tablet 3   cholecalciferol (VITAMIN D3) 25 MCG (1000 UNIT) tablet Take 1,000 Units by mouth daily. (Patient not taking: Reported on 02/19/2022)     diphenhydramine-acetaminophen (TYLENOL PM EXTRA STRENGTH) 25-500 MG TABS tablet Take 1 tablet by mouth at bedtime as needed (pain).     gabapentin (NEURONTIN)  300 MG capsule TAKE 3 CAPSULES BY MOUTH THREE TIMES DAILY IN THE MORNING AND IN THE AFTERNOON AND AT NIGHT 270 capsule 3   losartan (COZAAR) 50 MG tablet Take 1 tablet (50 mg total) by mouth daily. 90 tablet 1   Multiple Vitamin (MULTIVITAMIN) capsule Take 1 capsule by mouth daily.     sildenafil (VIAGRA) 50 MG tablet daily as needed. (Patient not taking: Reported on 10/08/2021)     sodium chloride (OCEAN) 0.65 % SOLN nasal spray Place 1 spray into both nostrils daily as needed for congestion.     spironolactone (ALDACTONE) 25 MG tablet Take 1 tablet (25 mg total) by mouth daily. 90 tablet 3   traMADol (ULTRAM) 50 MG tablet Take 1 tablet (50 mg total) by mouth every 6 (six) hours as needed. (Patient not taking: Reported on 02/19/2022) 30 tablet 1   No current facility-administered medications for this visit.    REVIEW OF SYSTEMS:    10 Point review of Systems was done is negative except as noted above.  PHYSICAL EXAMINATION: ECOG PERFORMANCE STATUS: 1 - Symptomatic but completely ambulatory VS stable GENERAL:alert, in no acute distress and comfortable SKIN: no acute rashes, no significant lesions EYES: conjunctiva are pink and non-injected, sclera anicteric OROPHARYNX: MMM, no exudates, no oropharyngeal erythema or ulceration NECK: supple, no JVD LYMPH:  no palpable  lymphadenopathy in the cervical, axillary or inguinal regions LUNGS: clear to auscultation b/l with normal respiratory effort HEART: regular rate & rhythm ABDOMEN:  normoactive bowel sounds , non tender, not distended. Extremity: no pedal edema PSYCH: alert & oriented x 3 with fluent speech NEURO: no focal motor/sensory deficits  LABORATORY DATA:  I have reviewed the data as listed .    Latest Ref Rng & Units 03/26/2022    8:10 AM 02/19/2022   12:05 PM 01/22/2022   11:05 AM  CBC  WBC 4.0 - 10.5 K/uL 6.9  6.3  5.9   Hemoglobin 13.0 - 17.0 g/dL 13.5  12.8  12.6   Hematocrit 39.0 - 52.0 % 38.6  36.8  36.9   Platelets 150 - 400 K/uL 283  275  257    .    Latest Ref Rng & Units 03/26/2022    8:10 AM 02/19/2022   12:05 PM 01/22/2022   11:05 AM  CMP  Glucose 70 - 99 mg/dL 103  99  122   BUN 8 - 23 mg/dL '17  15  25   '$ Creatinine 0.61 - 1.24 mg/dL 0.93  0.89  0.94   Sodium 135 - 145 mmol/L 142  138  139   Potassium 3.5 - 5.1 mmol/L 3.8  3.8  3.9   Chloride 98 - 111 mmol/L 107  105  106   CO2 22 - 32 mmol/L '29  28  29   '$ Calcium 8.9 - 10.3 mg/dL 9.6  9.8  9.7   Total Protein 6.5 - 8.1 g/dL 6.7  6.4  6.4   Total Bilirubin 0.3 - 1.2 mg/dL 0.5  0.7  0.4   Alkaline Phos 38 - 126 U/L 33  26  25   AST 15 - 41 U/L '13  13  13   '$ ALT 0 - 44 U/L '10  9  10     '$ Surgical Pathology  THIS IS AN ADDENDUM REPORT  CASE: [id number] PATIENT: Alexander Saunders Bone Marrow Report Addendum   Reason for Addendum #1:  Additional special stains Reason for Addendum #2:  Cytogenetics results  Clinical History: multiple myeloma  DIAGNOSIS:  BONE MARROW, ASPIRATE, CLOT, CORE: -  Hypercellular marrow involved by plasma cell myeloma -  See comment  PERIPHERAL BLOOD: -  Macrocytic anemia -  See complete blood cell count  COMMENT:  The findings are consistent with recurrence of the patient's previously diagnosed plasma cell myeloma.  For completeness, immunohistochemistry ([id number], kappa and lambda  by in situ hybridization) are pending and will be reported in an addendum.  MICROSCOPIC DESCRIPTION:  PERIPHERAL BLOOD SMEAR: The peripheral blood has a macrocytic anemia. There is no significant rouleaux formation identified.  Plasmacytoid lymphocytes are seen.  Platelets are within normal limits.  BONE MARROW ASPIRATE: Spicular, cellular and adequate for evaluation Erythroid precursors: Diminished numbers, no overt dysplasia Granulocytic precursors: Diminished numbers, no overt dysplasia Megakaryocytes: Present Lymphocytes/plasma cells: There is a marked increase of atypical plasma cells.  The plasma cells are enlarged and multinucleated.  There is no lymphocytosis.  TOUCH PREPARATIONS: No additional findings as compared to the aspirate smears  CLOT AND BIOPSY: Examination of the bone marrow core biopsy and clot section reveals a hypercellular marrow for age (> 95%) involved by plasma cell neoplasm.  Background hematopoiesis is markedly diminished. Megakaryocytes are present.  There are no granulomas identified.  IRON STAIN: Iron stains are performed on a bone marrow aspirate or touch imprint smear and section of clot. The controls stained appropriately.       Storage Iron: Present (aspirate smears)      Ring Sideroblasts: Not identified    RADIOGRAPHIC STUDIES: I have personally reviewed the radiological images as listed and agreed with the findings in the report. No results found. Pet/ct 12/2020  IMPRESSION: 1. No evidence of FDG avid nodal or visceral metastatic disease in the chest, abdomen or pelvis. 2. Numerous osseous lytic lesions are similar in appearance when compared with 2018 prior. No focal FDG uptake above background marrow activity. Findings are compatible with history of multiple myeloma. 3. Coronary artery calcifications of the LAD and circumflex. 4. Mild aortic Atherosclerosis (ICD10-I70.0).     Electronically Signed   By: Yetta Glassman M.D.    On: 12/28/2020 12:34   Pet/ct 03/16/2017 Impression   1.  Focal increased FDG uptake in the spinous process of T3 is consistent with site of multiple myeloma. 2.  No additional sites of increased metabolic activity to suggest cluster of myelomatous cells. 3.  Innumerable lytic lesions in the axial skeleton.    ASSESSMENT & PLAN:   Multiple Myeloma -First diagnosed in 2018 an relapsed in 2022.  Cytogenetics normal male karyotype Mol Cy - not done  PLAN: -discussed lab results from today, 03/26/2022, with the patient. CBC and CMP is stable.  -Last myeloma panel did not show any M-spike. -Labs and myeloma panel shows remission.  -Recommended RSV vaccine.  -Patient prefers morning appointments. -continue monthly daratumumab [place] maintenance at this time. -Answered all of patient's questions.   FOLLOW-UP: Please schedule next 4 doses of daratumumab per integrated scheduling. Labs with each treatment MD visit with every other treatment.  Next MD visit in 8 weeks (Patient prefers morning appointments for his treatments and MD visits)  The total time spent in the appointment was 31 minutes* .  All of the patient's questions were answered with apparent satisfaction. The patient knows to call the clinic with any problems, questions or concerns.   Sullivan Lone MD MS AAHIVMS [facility] Hematology/Oncology Physician [facility]  .*Total Encounter Time as defined by the Centers for Medicare and Medicaid Services includes, in  addition to the face-to-face time of a patient visit (documented in the note above) non-face-to-face time: obtaining and reviewing outside history, ordering and reviewing medications, tests or procedures, care coordination (communications with other health care professionals or caregivers) and documentation in the medical record.  I, Cleda Mccreedy, am acting as a Education administrator for Sullivan Lone, MD. .I have reviewed the above documentation for accuracy and  completeness, and I agree with the above. Brunetta Genera MD

## 2022-03-27 LAB — KAPPA/LAMBDA LIGHT CHAINS
Kappa free light chain: 9.9 mg/L (ref 3.3–19.4)
Kappa, lambda light chain ratio: 1.41 (ref 0.26–1.65)
Lambda free light chains: 7 mg/L (ref 5.7–26.3)

## 2022-03-31 ENCOUNTER — Other Ambulatory Visit: Payer: Self-pay

## 2022-03-31 DIAGNOSIS — C9000 Multiple myeloma not having achieved remission: Secondary | ICD-10-CM

## 2022-03-31 LAB — MULTIPLE MYELOMA PANEL, SERUM
Albumin SerPl Elph-Mcnc: 3.8 g/dL (ref 2.9–4.4)
Albumin/Glob SerPl: 1.7 (ref 0.7–1.7)
Alpha 1: 0.2 g/dL (ref 0.0–0.4)
Alpha2 Glob SerPl Elph-Mcnc: 0.7 g/dL (ref 0.4–1.0)
B-Globulin SerPl Elph-Mcnc: 0.9 g/dL (ref 0.7–1.3)
Gamma Glob SerPl Elph-Mcnc: 0.5 g/dL (ref 0.4–1.8)
Globulin, Total: 2.3 g/dL (ref 2.2–3.9)
IgA: 29 mg/dL — ABNORMAL LOW (ref 61–437)
IgG (Immunoglobin G), Serum: 593 mg/dL — ABNORMAL LOW (ref 603–1613)
IgM (Immunoglobulin M), Srm: 9 mg/dL — ABNORMAL LOW (ref 15–143)
Total Protein ELP: 6.1 g/dL (ref 6.0–8.5)

## 2022-03-31 MED ORDER — ACYCLOVIR 400 MG PO TABS: 400.0000 mg | ORAL_TABLET | Freq: Every day | ORAL | 3 refills | Status: DC

## 2022-04-01 ENCOUNTER — Encounter: Payer: Self-pay | Admitting: Oncology

## 2022-04-08 DIAGNOSIS — C9000 Multiple myeloma not having achieved remission: Secondary | ICD-10-CM | POA: Diagnosis not present

## 2022-04-16 ENCOUNTER — Ambulatory Visit: Payer: TRICARE For Life (TFL)

## 2022-04-16 ENCOUNTER — Ambulatory Visit: Payer: TRICARE For Life (TFL) | Admitting: Hematology

## 2022-04-16 ENCOUNTER — Other Ambulatory Visit: Payer: TRICARE For Life (TFL)

## 2022-04-16 ENCOUNTER — Inpatient Hospital Stay: Payer: Medicare Other

## 2022-04-16 ENCOUNTER — Inpatient Hospital Stay: Payer: Medicare Other | Attending: Oncology

## 2022-04-16 VITALS — BP 132/69 | HR 60 | Temp 98.4°F | Resp 16 | Wt 208.5 lb

## 2022-04-16 DIAGNOSIS — C9000 Multiple myeloma not having achieved remission: Secondary | ICD-10-CM

## 2022-04-16 DIAGNOSIS — C9002 Multiple myeloma in relapse: Secondary | ICD-10-CM | POA: Diagnosis not present

## 2022-04-16 DIAGNOSIS — Z5112 Encounter for antineoplastic immunotherapy: Secondary | ICD-10-CM | POA: Insufficient documentation

## 2022-04-16 LAB — CBC WITH DIFFERENTIAL (CANCER CENTER ONLY)
Abs Immature Granulocytes: 0.01 10*3/uL (ref 0.00–0.07)
Basophils Absolute: 0 10*3/uL (ref 0.0–0.1)
Basophils Relative: 1 %
Eosinophils Absolute: 0.4 10*3/uL (ref 0.0–0.5)
Eosinophils Relative: 7 %
HCT: 37 % — ABNORMAL LOW (ref 39.0–52.0)
Hemoglobin: 12.7 g/dL — ABNORMAL LOW (ref 13.0–17.0)
Immature Granulocytes: 0 %
Lymphocytes Relative: 17 %
Lymphs Abs: 0.9 10*3/uL (ref 0.7–4.0)
MCH: 33.1 pg (ref 26.0–34.0)
MCHC: 34.3 g/dL (ref 30.0–36.0)
MCV: 96.4 fL (ref 80.0–100.0)
Monocytes Absolute: 0.8 10*3/uL (ref 0.1–1.0)
Monocytes Relative: 14 %
Neutro Abs: 3.3 10*3/uL (ref 1.7–7.7)
Neutrophils Relative %: 61 %
Platelet Count: 220 10*3/uL (ref 150–400)
RBC: 3.84 MIL/uL — ABNORMAL LOW (ref 4.22–5.81)
RDW: 13.2 % (ref 11.5–15.5)
WBC Count: 5.4 10*3/uL (ref 4.0–10.5)
nRBC: 0 % (ref 0.0–0.2)

## 2022-04-16 LAB — CMP (CANCER CENTER ONLY)
ALT: 9 U/L (ref 0–44)
AST: 13 U/L — ABNORMAL LOW (ref 15–41)
Albumin: 4.1 g/dL (ref 3.5–5.0)
Alkaline Phosphatase: 29 U/L — ABNORMAL LOW (ref 38–126)
Anion gap: 7 (ref 5–15)
BUN: 19 mg/dL (ref 8–23)
CO2: 27 mmol/L (ref 22–32)
Calcium: 9.2 mg/dL (ref 8.9–10.3)
Chloride: 107 mmol/L (ref 98–111)
Creatinine: 0.88 mg/dL (ref 0.61–1.24)
GFR, Estimated: >60 mL/min (ref >60)
Glucose, Bld: 115 mg/dL — ABNORMAL HIGH (ref 70–99)
Potassium: 3.8 mmol/L (ref 3.5–5.1)
Sodium: 141 mmol/L (ref 135–145)
Total Bilirubin: 0.5 mg/dL (ref 0.3–1.2)
Total Protein: 6.3 g/dL — ABNORMAL LOW (ref 6.5–8.1)

## 2022-04-16 MED ORDER — DIPHENHYDRAMINE HCL 25 MG PO CAPS
50.0000 mg | ORAL_CAPSULE | Freq: Once | ORAL | Status: AC
  Administered 2022-04-16: 50 mg via ORAL
  Filled 2022-04-16: qty 2

## 2022-04-16 MED ORDER — ACETAMINOPHEN 325 MG PO TABS
650.0000 mg | ORAL_TABLET | Freq: Once | ORAL | Status: AC
  Administered 2022-04-16: 650 mg via ORAL
  Filled 2022-04-16: qty 2

## 2022-04-16 MED ORDER — DEXAMETHASONE 4 MG PO TABS
20.0000 mg | ORAL_TABLET | Freq: Once | ORAL | Status: AC
  Administered 2022-04-16: 20 mg via ORAL
  Filled 2022-04-16: qty 5

## 2022-04-16 MED ORDER — DARATUMUMAB-HYALURONIDASE-FIHJ 1800-30000 MG-UT/15ML ~~LOC~~ SOLN
1800.0000 mg | Freq: Once | SUBCUTANEOUS | Status: AC
  Administered 2022-04-16: 1800 mg via SUBCUTANEOUS
  Filled 2022-04-16: qty 15

## 2022-04-17 LAB — KAPPA/LAMBDA LIGHT CHAINS
Kappa free light chain: 9.5 mg/L (ref 3.3–19.4)
Kappa, lambda light chain ratio: 1.48 (ref 0.26–1.65)
Lambda free light chains: 6.4 mg/L (ref 5.7–26.3)

## 2022-04-22 ENCOUNTER — Other Ambulatory Visit: Payer: Self-pay

## 2022-04-22 DIAGNOSIS — C9000 Multiple myeloma not having achieved remission: Secondary | ICD-10-CM

## 2022-04-22 LAB — MULTIPLE MYELOMA PANEL, SERUM
Albumin SerPl Elph-Mcnc: 3.6 g/dL (ref 2.9–4.4)
Albumin/Glob SerPl: 1.6 (ref 0.7–1.7)
Alpha 1: 0.2 g/dL (ref 0.0–0.4)
Alpha2 Glob SerPl Elph-Mcnc: 0.7 g/dL (ref 0.4–1.0)
B-Globulin SerPl Elph-Mcnc: 0.9 g/dL (ref 0.7–1.3)
Gamma Glob SerPl Elph-Mcnc: 0.5 g/dL (ref 0.4–1.8)
Globulin, Total: 2.3 g/dL (ref 2.2–3.9)
IgA: 29 mg/dL — ABNORMAL LOW (ref 61–437)
IgG (Immunoglobin G), Serum: 604 mg/dL (ref 603–1613)
IgM (Immunoglobulin M), Srm: 10 mg/dL — ABNORMAL LOW (ref 15–143)
Total Protein ELP: 5.9 g/dL — ABNORMAL LOW (ref 6.0–8.5)

## 2022-04-22 MED ORDER — GABAPENTIN 300 MG PO CAPS: ORAL_CAPSULE | ORAL | 3 refills | Status: AC

## 2022-04-25 ENCOUNTER — Other Ambulatory Visit: Payer: Self-pay | Admitting: Oncology

## 2022-05-06 ENCOUNTER — Other Ambulatory Visit: Payer: Self-pay

## 2022-05-13 NOTE — Progress Notes (Signed)
Cardiology Clinic Note   Patient Name: Alexander Saunders Date of Encounter: 05/16/2022  Primary Care Provider:  Jackie Plumsei-Bonsu, George, MD Primary Cardiologist:  Nanetta BattyJonathan Berry, MD  Patient Profile    [id number] year old male patient with history of multiple myeloma status postchemotherapy and stem cell transplant (followed by Dr.Kale-Hematology/Oncology),  Nonischemic cardiomyopathy with, moderately severe LV dysfunction, EF of 30 to 35% with LV dilatation, mild MR (functional) with small pericardial effusion on 2D echocardiogram 05/13/2021.  He is on guideline directed medical therapy, with the exception of Jardiance causing UTI symptoms.  On last office visit on 10/08/2021 with Dr. Allyson SabalBerry, carvedilol was increase from 9.375 mg twice daily to 12.5 mg twice daily, remained on losartan, spironolactone.  He was to have follow-up echocardiogram completed which should be ordered on this office visit.  At that office visit Mr. Alexander Saunders was doing well and remain physically active and was asymptomatic.  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 Alexander Saunders comes today for ongoing assessment and management for nonischemic cardiomyopathy most recent LVEF of 30 to 35%, mild MR.  Was last seen by Dr. Allyson SabalBerry on 10/08/2021 with increased dose of carvedilol to 12.5 mg twice daily.  He comes today without any complaints.  He has a Systems analystpersonal trainer works out regularly, and also hikes.  He denies any chest pain dyspnea on exertion weight gain or fatigue.  He is tolerating higher dose of carvedilol without issue.  Occasionally he has palpitations during a heavy workout.  He normally slows down.  He admits to drinking caffeine which tends to exacerbate.  He is taking CoQ 10 enzyme supplements.   Home Medications    Current Outpatient Medications  Medication Sig Dispense Refill   acyclovir (ZOVIRAX) 400 MG tablet Take 1 tablet (400 mg total) by mouth daily. 90 tablet 3   aspirin EC 81 MG tablet Take 81 mg by mouth daily.     B Complex Vitamins (B-COMPLEX/B-12) TABS Take 1 tablet by mouth daily.     bisacodyl (DULCOLAX) 5 MG EC tablet Take 5 mg by mouth daily as needed for moderate constipation.     carboxymethylcellulose (REFRESH PLUS) 0.5 % SOLN Place 1 drop into both eyes 3 (three) times daily as needed (dry eyes).     carboxymethylcellulose (REFRESH PLUS) 0.5 % SOLN Apply 2 drops to eye daily as needed (as needed).     carvedilol (COREG) 12.5 MG tablet Take 1  tablet (12.5 mg total) by mouth 2 (two) times daily. 180 tablet 3   cholecalciferol (VITAMIN D3) 25 MCG (1000 UNIT) tablet Take 1,000 Units by mouth daily.     diphenhydramine-acetaminophen (TYLENOL PM EXTRA STRENGTH) 25-500 MG TABS tablet Take 1 tablet by mouth at bedtime as needed (pain).     gabapentin (NEURONTIN) 300 MG capsule TAKE 3 CAPSULES BY MOUTH THREE TIMES DAILY IN THE MORNING AND IN THE AFTERNOON AND AT NIGHT 270 capsule 3   Multiple Vitamin (MULTIVITAMIN) capsule Take 1 capsule by mouth daily.     sodium chloride (OCEAN) 0.65 % SOLN  nasal spray Place 1 spray into both nostrils daily as needed for congestion.     spironolactone (ALDACTONE) 25 MG tablet Take 1 tablet (25 mg total) by mouth daily. 90 tablet 3   sildenafil (VIAGRA) 50 MG tablet daily as needed. (Patient not taking: Reported on 05/16/2022)     Current Facility-Administered Medications  Medication Dose Route Frequency Provider Last Rate Last Admin   sacubitril-valsartan (ENTRESTO) 24-26 mg per tablet  1 tablet Oral BID Jodelle Gross, NP         Family History    Family History  Problem Relation Age of Onset   Hypertension Mother    Cancer Mother    Lung cancer Mother    Hypertension Father    Colon cancer Neg Hx    Colon polyps Neg Hx    Esophageal cancer Neg Hx    Rectal cancer Neg Hx    Stomach cancer Neg Hx    He indicated that his mother is deceased. He indicated that his father is deceased. He indicated that all of his four sisters are alive. He indicated that all of his three brothers are alive. He indicated that his maternal grandmother is deceased. He indicated that his maternal grandfather is deceased. He indicated that his paternal grandmother is deceased. He indicated that his paternal grandfather is deceased. He indicated that his daughter is alive. He indicated that his son is alive. He indicated that the status of his neg hx is unknown.  Social History    Social History   Socioeconomic History   Marital status: Married    Spouse name: Not on file   Number of children: Not on file   Years of education: Not on file   Highest education level: Not on file  Occupational History   Not on file  Tobacco Use   Smoking status: Never   Smokeless tobacco: Never  Vaping Use   Vaping Use: Never used  Substance and Sexual Activity   Alcohol use: Yes    Comment: occ   Drug use: Never   Sexual activity: Not on file  Other Topics Concern   Not on file  Social History Narrative   He worked as a Sports coach level of  education:  college   He is back in college at [employer] studying exercise physiology.       Right handed      Single story home   Social Determinants of Health   Financial Resource Strain: Not on file  Food Insecurity: No Food Insecurity (09/11/2021)   Hunger Vital Sign    Worried About Running Out of Food in the Last Year: Never true    Ran Out of Food in the Last Year: Never true  Transportation Needs: No Transportation Needs (09/11/2021)   PRAPARE - Administrator, Civil Service (Medical): No  Lack of Transportation (Non-Medical): No  Physical Activity: Not on file  Stress: Not on file  Social Connections: Not on file  Intimate Partner Violence: Not on file     Review of Systems    General:  No chills, fever, night sweats or weight changes.  Cardiovascular:  No chest pain, dyspnea on exertion, edema, orthopnea, palpitations, paroxysmal nocturnal dyspnea. Dermatological: No rash, lesions/masses Respiratory: No cough, dyspnea Urologic: No hematuria, dysuria Abdominal:   No nausea, vomiting, diarrhea, bright red blood per rectum, melena, or hematemesis Neurologic:  No visual changes, wkns, changes in mental status. All other systems reviewed and are otherwise negative except as noted above.     Physical Exam    VS:  BP 134/84   Pulse 67   Ht 6' (1.829 m)   Wt 206 lb 9.6 oz (93.7 kg)   SpO2 97%   BMI 28.02 kg/m  , BMI Body mass index is 28.02 kg/m.     GEN: Well nourished, well developed, in no acute distress. HEENT: normal. Neck: Supple, no JVD, carotid bruits, or masses. Cardiac: RRR, no murmurs, rubs, or gallops. No clubbing, cyanosis, edema.  Radials/DP/PT 2+ and equal bilaterally.  Respiratory:  Respirations regular and unlabored, clear to auscultation bilaterally. GI: Soft, nontender, nondistended, BS + x 4. MS: no deformity or atrophy. Skin: warm and dry, no rash. Neuro:  Strength and sensation are intact. Psych: Normal affect.  Accessory Clinical  Findings    ECG personally reviewed by me today-not completed this office visit.  Lab Results  Component Value Date   WBC 5.4 04/16/2022   HGB 12.7 (L) 04/16/2022   HCT 37.0 (L) 04/16/2022   MCV 96.4 04/16/2022   PLT 220 04/16/2022   Lab Results  Component Value Date   CREATININE 0.88 04/16/2022   BUN 19 04/16/2022   NA 141 04/16/2022   K 3.8 04/16/2022   CL 107 04/16/2022   CO2 27 04/16/2022   Lab Results  Component Value Date   ALT 9 04/16/2022   AST 13 (L) 04/16/2022   ALKPHOS 29 (L) 04/16/2022   BILITOT 0.5 04/16/2022   No results found for: "CHOL", "HDL", "LDLCALC", "LDLDIRECT", "TRIG", "CHOLHDL"  No results found for: "HGBA1C"  Review of Prior Studies: Echocardiogram 09/06/2021  1. Left ventricular ejection fraction, by estimation, is 30 to 35%. The  left ventricle has moderately decreased function. The left ventricle  demonstrates global hypokinesis. The left ventricular internal cavity size  was moderately dilated. There is mild   concentric left ventricular hypertrophy. Left ventricular diastolic  parameters are consistent with Grade I diastolic dysfunction (impaired  relaxation).   2. Right ventricular systolic function is normal. The right ventricular  size is normal.   3. Left atrial size was moderately dilated.   4. The mitral valve is grossly normal. Mild mitral valve regurgitation.   5. The aortic valve is tricuspid. There is mild calcification of the  aortic valve. There is mild thickening of the aortic valve. Aortic valve  regurgitation is not visualized. Aortic valve sclerosis/calcification is  present, without any evidence of  aortic stenosis.   6. Aortic dilatation noted. There is mild dilatation of the aortic root,  measuring 42 mm.   7. The inferior vena cava is normal in size with greater than 50%  respiratory variability, suggesting right atrial pressure of 3 mmHg.   Assessment & Plan   1.  HFrEF: Most recent echocardiogram revealed EF of  35 to 40%.  When last seen by  Dr. Gery Pray carvedilol was increased to 12.5 mg twice daily and he is tolerating this well.  He is already on ARB, losartan, plan is to transition to [facility] 24-26 per GDMT and discontinue losartan.  Follow-up echocardiogram is being ordered for ongoing surveillance of LV function.  I have explained that his blood pressure may decrease some on the Entresto compared to being on losartan.  Baseline creatinine 0.88.  Potassium 3.8.  Follow-up BMET in 1 month to evaluate response to [facility].  Samples are provided.  2.  Hypertension: Blood pressure is not optimal for reduced EF.  Addition of Entresto as discussed above.  Continue carvedilol 12.5 mg twice daily and spironolactone.  Low-sodium diet is recommended.  Reduce caffeine.  3.  Multiple myeloma: Followed by oncology.         Signed, Bettey Mare. Liborio Nixon, ANP, AACC   05/16/2022 10:25 AM      Office [phone] Fax [phone]  Notice: This dictation was prepared with Dragon dictation along with smaller phrase technology. Any transcriptional errors that result from this process are unintentional and may not be corrected upon review.

## 2022-05-16 ENCOUNTER — Encounter: Payer: Self-pay | Admitting: Adult Health

## 2022-05-16 ENCOUNTER — Ambulatory Visit: Payer: Medicare Other | Attending: Adult Health | Admitting: Adult Health

## 2022-05-16 VITALS — BP 134/84 | HR 67 | Ht 72.0 in | Wt 206.6 lb

## 2022-05-16 DIAGNOSIS — I5020 Unspecified systolic (congestive) heart failure: Secondary | ICD-10-CM | POA: Diagnosis not present

## 2022-05-16 DIAGNOSIS — C9000 Multiple myeloma not having achieved remission: Secondary | ICD-10-CM | POA: Insufficient documentation

## 2022-05-16 DIAGNOSIS — I10 Essential (primary) hypertension: Secondary | ICD-10-CM | POA: Insufficient documentation

## 2022-05-16 DIAGNOSIS — Z79899 Other long term (current) drug therapy: Secondary | ICD-10-CM | POA: Diagnosis not present

## 2022-05-16 MED ORDER — SACUBITRIL-VALSARTAN 24-26 MG PO TABS
1.0000 | ORAL_TABLET | Freq: Two times a day (BID) | ORAL | Status: DC
Start: 2022-05-16 — End: 2022-10-22

## 2022-05-16 NOTE — Patient Instructions (Addendum)
Medication Instructions:  Stop taking Losartan Start Entresto 24-26 MG One Tablet Two times daily  *If you need a refill on your cardiac medications before your next appointment, please call your pharmacy*   Lab Work: Your physician recommends that you return for lab work in: 1 Month BMET   If you have labs (blood work) drawn today and your tests are completely normal, you will receive your results only by: [employer] (if you have MyChart) OR A paper copy in the mail If you have any lab test that is abnormal or we need to change your treatment, we will call you to review the results.   Testing/Procedures: Your physician has requested that you have an echocardiogram. Echocardiography is a painless test that uses sound waves to create images of your heart. It provides your doctor with information about the size and shape of your heart and how well your heart's chambers and valves are working. This procedure takes approximately one hour. There are no restrictions for this procedure. Please do NOT wear cologne, perfume, aftershave, or lotions (deodorant is allowed). Please arrive 15 minutes prior to your appointment time.    Follow-Up: At [facility], you and your health needs are our priority.  As part of our continuing mission to provide you with exceptional heart care, we have created designated Provider Care Teams.  These Care Teams include your primary Cardiologist (physician) and Advanced Practice Providers (APPs -  Physician Assistants and Nurse Practitioners) who all work together to provide you with the care you need, when you need it.  We recommend signing up for the patient portal called "MyChart".  Sign up information is provided on this After Visit Summary.  MyChart is used to connect with patients for Virtual Visits (Telemedicine).  Patients are able to view lab/test results, encounter notes, upcoming appointments, etc.  Non-urgent messages can be sent to your  provider as well.   To learn more about what you can do with MyChart, go to [email or web address].    Your next appointment:   4 week(s)  Provider:   Joni Reining, DNP, ANP

## 2022-05-18 ENCOUNTER — Other Ambulatory Visit: Payer: Self-pay

## 2022-05-21 ENCOUNTER — Inpatient Hospital Stay: Payer: Medicare Other

## 2022-05-21 ENCOUNTER — Inpatient Hospital Stay: Payer: Medicare Other | Attending: Oncology | Admitting: Hematology

## 2022-05-21 ENCOUNTER — Telehealth: Payer: Self-pay | Admitting: Hematology

## 2022-05-21 VITALS — BP 126/72 | HR 62

## 2022-05-21 VITALS — BP 112/70 | HR 61 | Temp 98.0°F | Resp 14 | Ht 72.0 in | Wt 205.6 lb

## 2022-05-21 DIAGNOSIS — C9000 Multiple myeloma not having achieved remission: Secondary | ICD-10-CM

## 2022-05-21 DIAGNOSIS — Z5112 Encounter for antineoplastic immunotherapy: Secondary | ICD-10-CM | POA: Insufficient documentation

## 2022-05-21 DIAGNOSIS — K5900 Constipation, unspecified: Secondary | ICD-10-CM | POA: Diagnosis not present

## 2022-05-21 DIAGNOSIS — C9002 Multiple myeloma in relapse: Secondary | ICD-10-CM | POA: Insufficient documentation

## 2022-05-21 LAB — CBC WITH DIFFERENTIAL (CANCER CENTER ONLY)
Abs Immature Granulocytes: 0.02 10*3/uL (ref 0.00–0.07)
Basophils Absolute: 0.1 10*3/uL (ref 0.0–0.1)
Basophils Relative: 1 %
Eosinophils Absolute: 0.3 10*3/uL (ref 0.0–0.5)
Eosinophils Relative: 6 %
HCT: 35.5 % — ABNORMAL LOW (ref 39.0–52.0)
Hemoglobin: 12.3 g/dL — ABNORMAL LOW (ref 13.0–17.0)
Immature Granulocytes: 0 %
Lymphocytes Relative: 18 %
Lymphs Abs: 1 10*3/uL (ref 0.7–4.0)
MCH: 32.9 pg (ref 26.0–34.0)
MCHC: 34.6 g/dL (ref 30.0–36.0)
MCV: 94.9 fL (ref 80.0–100.0)
Monocytes Absolute: 0.7 10*3/uL (ref 0.1–1.0)
Monocytes Relative: 13 %
Neutro Abs: 3.3 10*3/uL (ref 1.7–7.7)
Neutrophils Relative %: 62 %
Platelet Count: 294 10*3/uL (ref 150–400)
RBC: 3.74 MIL/uL — ABNORMAL LOW (ref 4.22–5.81)
RDW: 13.2 % (ref 11.5–15.5)
WBC Count: 5.4 10*3/uL (ref 4.0–10.5)
nRBC: 0 % (ref 0.0–0.2)

## 2022-05-21 LAB — CMP (CANCER CENTER ONLY)
ALT: 9 U/L (ref 0–44)
AST: 13 U/L — ABNORMAL LOW (ref 15–41)
Albumin: 4.3 g/dL (ref 3.5–5.0)
Alkaline Phosphatase: 24 U/L — ABNORMAL LOW (ref 38–126)
Anion gap: 5 (ref 5–15)
BUN: 16 mg/dL (ref 8–23)
CO2: 26 mmol/L (ref 22–32)
Calcium: 9.3 mg/dL (ref 8.9–10.3)
Chloride: 108 mmol/L (ref 98–111)
Creatinine: 0.85 mg/dL (ref 0.61–1.24)
GFR, Estimated: >60 mL/min (ref >60)
Glucose, Bld: 111 mg/dL — ABNORMAL HIGH (ref 70–99)
Potassium: 3.9 mmol/L (ref 3.5–5.1)
Sodium: 139 mmol/L (ref 135–145)
Total Bilirubin: 0.4 mg/dL (ref 0.3–1.2)
Total Protein: 6.4 g/dL — ABNORMAL LOW (ref 6.5–8.1)

## 2022-05-21 MED ORDER — DEXAMETHASONE 4 MG PO TABS
16.0000 mg | ORAL_TABLET | Freq: Once | ORAL | Status: AC
  Administered 2022-05-21: 16 mg via ORAL
  Filled 2022-05-21: qty 4

## 2022-05-21 MED ORDER — ACETAMINOPHEN 325 MG PO TABS
650.0000 mg | ORAL_TABLET | Freq: Once | ORAL | Status: AC
  Administered 2022-05-21: 650 mg via ORAL
  Filled 2022-05-21: qty 2

## 2022-05-21 MED ORDER — DIPHENHYDRAMINE HCL 25 MG PO CAPS
50.0000 mg | ORAL_CAPSULE | Freq: Once | ORAL | Status: AC
  Administered 2022-05-21: 50 mg via ORAL
  Filled 2022-05-21: qty 2

## 2022-05-21 MED ORDER — DARATUMUMAB-HYALURONIDASE-FIHJ 1800-30000 MG-UT/15ML ~~LOC~~ SOLN
1800.0000 mg | Freq: Once | SUBCUTANEOUS | Status: AC
  Administered 2022-05-21: 1800 mg via SUBCUTANEOUS
  Filled 2022-05-21: qty 15

## 2022-05-21 NOTE — Patient Instructions (Signed)
[facility]  Discharge Instructions: Thank you for choosing [facility] to provide your oncology and hematology care.   If you have a lab appointment with the Cancer Center, please go directly to the Cancer Center and check in at the registration area.   Wear comfortable clothing and clothing appropriate for easy access to any Portacath or PICC line.   We strive to give you quality time with your provider. You may need to reschedule your appointment if you arrive late (15 or more minutes).  Arriving late affects you and other patients whose appointments are after yours.  Also, if you miss three or more appointments without notifying the office, you may be dismissed from the clinic at the provider's discretion.      For prescription refill requests, have your pharmacy contact our office and allow 72 hours for refills to be completed.    Today you received the following chemotherapy and/or immunotherapy agents Darzalex Faspro      To help prevent nausea and vomiting after your treatment, we encourage you to take your nausea medication as directed.  BELOW ARE SYMPTOMS THAT SHOULD BE REPORTED IMMEDIATELY: *FEVER GREATER THAN 100.4 F (38 C) OR HIGHER *CHILLS OR SWEATING *NAUSEA AND VOMITING THAT IS NOT CONTROLLED WITH YOUR NAUSEA MEDICATION *UNUSUAL SHORTNESS OF BREATH *UNUSUAL BRUISING OR BLEEDING *URINARY PROBLEMS (pain or burning when urinating, or frequent urination) *BOWEL PROBLEMS (unusual diarrhea, constipation, pain near the anus) TENDERNESS IN MOUTH AND THROAT WITH OR WITHOUT PRESENCE OF ULCERS (sore throat, sores in mouth, or a toothache) UNUSUAL RASH, SWELLING OR PAIN  UNUSUAL VAGINAL DISCHARGE OR ITCHING   Items with * indicate a potential emergency and should be followed up as soon as possible or go to the Emergency Department if any problems should occur.  Please show the CHEMOTHERAPY ALERT CARD or IMMUNOTHERAPY ALERT CARD at  check-in to the Emergency Department and triage nurse.  Should you have questions after your visit or need to cancel or reschedule your appointment, please contact [facility]  Dept: [phone]  and follow the prompts.  Office hours are 8:00 a.m. to 4:30 p.m. Monday - Friday. Please note that voicemails left after 4:00 p.m. may not be returned until the following business day.  We are closed weekends and major holidays. You have access to a nurse at all times for urgent questions. Please call the main number to the clinic Dept: [phone] and follow the prompts.   For any non-urgent questions, you may also contact your provider using MyChart. We now offer e-Visits for anyone 18 and older to request care online for non-urgent symptoms. For details visit [email or web address].   Also download the MyChart app! Go to the app store, search "MyChart", open the app, select [place], and log in with your MyChart username and password.  

## 2022-05-21 NOTE — Progress Notes (Signed)
HEMATOLOGY/ONCOLOGY CLINIC NOTE  Date of Service: 05/21/2022  Patient Care Team: Jackie Plum, MD as PCP - General (Internal Medicine) Runell Gess, MD as PCP - Cardiology (Cardiology)  CHIEF COMPLAINTS/PURPOSE OF CONSULTATION:  Evaluation and management of Kappa Light Chain Multiple Myeloma  HISTORY OF PRESENTING ILLNESS:   Alexander Saunders is a wonderful 72 y.o. male who is here for evaluation and management of multiple myeloma. Patient was following with Dr. Clelia Croft and has been transferred to [place]. Patient was diagnosed with multiple myeloma in 2018 which relapsed in 2022 with prior treatment as noted below.  Prior Therapy:   He is status post surgical decompression of an epidural mass on 09/04/2016 and the pathology showed a plasma cell neoplasm.   He is S/P adjuvant radiation therapy to the thoracic spine to be completed on 10/14/2016.   Velcade 1.5 mg/m weekly with dexamethasone 20 mg and Cytoxan 600 mg po. Therapy started on 10/24/2016.    High-dose chemotherapy followed by stem cell transplant: Melphalan 200 mg/m2 completed on 06/17/17 followed by autologous stem cell rescue 06/18/17.  He achieved complete response.     Zometa 4 mg every 3 months started in February 2020.  Last treatment given in January 2022.     Revlimid 10 mg daily for 21 days out of a 28-day started in February 2020.  Therapy discontinued in November 2022 because of worsening anemia.   Carfilzomib 20 mg/m weekly started on February 19, 2021.  Therapy stopped in March 2023 for presumed cardiomyopathy complication.     Daratumumab 1800 mg subcutaneous injection weekly started on February 19, 2021 with Dexamethasone weekly.     Current treatment: Patient is currently on monthly Daratumumab 1800 mg subcutaneous injections per protocol currently every 28 days with Dexamethasone weekly 20 mg.  Patient notes he is doing well overall without any new medical concern since his last visit with Dr.  Clelia Croft. He complains of weight gain from Dexamethasone, which he is currently trying to loss. He denies any toxicities with Daratumumab.   Patient notes that he tested positive for COVID-19 around 2-3 months and notes that his symptoms have improved since.  He denies fever, chills, night sweats, abdominal pain, back pain, chest pain, or leg swelling. However, he does complain of intermittent hip pain.  He has received his influenza vaccine, but denies COVID-19 Booster ad RSV vaccine.    INTERVAL HISTORY:  Alexander Saunders is a wonderful 72 y.o. male who is here for evaluation and management of multiple myeloma.   Patient was last seen by me on 03/26/2022 and complained of mild bone pain throughout the body, but was otherwise doing well overall with no new medical concerns.  Today, he complains of worsened constipation. He has had issues with constipation his entire life. He currently takes 3-4 doses every couple of days of Senna. He has a bowel movement every 3-4 days, which is a new pattern. The stools are consistently soft. He denies any blood/mucus in the stool or any abdominal pain. He notes that he will see a gastroenterologist tomorrow. Patient's last colonoscopy was 3 years ago.   Patient reports that he has been trying to lose some weight and therefore has been consuming less food. Patient does note some knee pain which he attributes to his weight. His current weight is 205 pounds. He notes that he could increase the fiber in his diet. Patient regularly works out 4-5 days a week.  He reports that after hiking, he has  since experienced some pain in his groin/hip area. The pain is present in the mornings and usually improves throughout the day. Patient does regularly take a hot bath with epsom salt.  Patient denies any infection issues, but notes watery eyes and sneezing from seasonal allergies. He denies any new bone pain or abdominal pain. Patient denies any chills, fever, skin rashes, or  uncontrollable nausea.  Patient is UTD with COVID-19, pneumonia, and influenza vaccinations. However, he has not yet received the RSV vaccination.  MEDICAL HISTORY:  Past Medical History:  Diagnosis Date   Anxiety    occasional    Blood transfusion without reported diagnosis 2018   with spinal surgery   History of chemotherapy    completed 06-17-2017   History of radiation therapy    completed 11-2016 per pt   Multiple myeloma 2018   currently in remission    Neuromuscular disorder    neuropathy lower extremeties, primarily right leg    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   Stem cells transplant status 06/2017    SURGICAL HISTORY: Past Surgical History:  Procedure Laterality Date   APPENDECTOMY     BONE MARROW BIOPSY  07/2018   COLONOSCOPY     KNEE ARTHROSCOPY     r/knee   LAMINECTOMY N/A 09/04/2016   Procedure: THORACIC FOUR-SIX LAMINECTOMY FOR RESECTION OF TUMOR;  Surgeon: Ditty, Loura Halt, MD;  Location: [facility] OR;  Service: Neurosurgery;  Laterality: N/A;    SOCIAL HISTORY: Social History   Socioeconomic History   Marital status: Married    Spouse name: Not on file   Number of children: Not on file   Years of education: Not on file   Highest education level: Not on file  Occupational History   Not on file  Tobacco Use   Smoking status: Never   Smokeless tobacco: Never  Vaping Use   Vaping Use: Never used  Substance and Sexual Activity   Alcohol use: Yes    Comment: occ   Drug use: Never   Sexual activity: Not on file  Other Topics Concern   Not on file  Social History Narrative   He worked as a Sports coach level of education:  college   He is back in college at [employer] studying exercise physiology.       Right handed      Single story home   Social Determinants of Health   Financial Resource Strain: Not on file  Food Insecurity: No Food Insecurity (09/11/2021)   Hunger Vital Sign    Worried About Running Out of Food in the Last Year:  Never true    Ran Out of Food in the Last Year: Never true  Transportation Needs: No Transportation Needs (09/11/2021)   PRAPARE - Administrator, Civil Service (Medical): No    Lack of Transportation (Non-Medical): No  Physical Activity: Not on file  Stress: Not on file  Social Connections: Not on file  Intimate Partner Violence: Not on file    FAMILY HISTORY: Family History  Problem Relation Age of Onset   Hypertension Mother    Cancer Mother    Lung cancer Mother    Hypertension Father    Colon cancer Neg Hx    Colon polyps Neg Hx    Esophageal cancer Neg Hx    Rectal cancer Neg Hx    Stomach cancer Neg Hx     ALLERGIES:  is allergic to jardiance [empagliflozin].  MEDICATIONS:  Current Outpatient Medications  Medication Sig Dispense Refill   acyclovir (ZOVIRAX) 400 MG tablet Take 1 tablet (400 mg total) by mouth daily. 90 tablet 3   aspirin EC 81 MG tablet Take 81 mg by mouth daily.     B Complex Vitamins (B-COMPLEX/B-12) TABS Take 1 tablet by mouth daily.     bisacodyl (DULCOLAX) 5 MG EC tablet Take 5 mg by mouth daily as needed for moderate constipation.     carboxymethylcellulose (REFRESH PLUS) 0.5 % SOLN Place 1 drop into both eyes 3 (three) times daily as needed (dry eyes).     carboxymethylcellulose (REFRESH PLUS) 0.5 % SOLN Apply 2 drops to eye daily as needed (as needed).     carvedilol (COREG) 12.5 MG tablet Take 1 tablet (12.5 mg total) by mouth 2 (two) times daily. 180 tablet 3   cholecalciferol (VITAMIN D3) 25 MCG (1000 UNIT) tablet Take 1,000 Units by mouth daily.     diphenhydramine-acetaminophen (TYLENOL PM EXTRA STRENGTH) 25-500 MG TABS tablet Take 1 tablet by mouth at bedtime as needed (pain).     gabapentin (NEURONTIN) 300 MG capsule TAKE 3 CAPSULES BY MOUTH THREE TIMES DAILY IN THE MORNING AND IN THE AFTERNOON AND AT NIGHT 270 capsule 3   Multiple Vitamin (MULTIVITAMIN) capsule Take 1 capsule by mouth daily.     sildenafil (VIAGRA) 50 MG tablet  daily as needed. (Patient not taking: Reported on 05/16/2022)     sodium chloride (OCEAN) 0.65 % SOLN nasal spray Place 1 spray into both nostrils daily as needed for congestion.     spironolactone (ALDACTONE) 25 MG tablet Take 1 tablet (25 mg total) by mouth daily. 90 tablet 3   Current Facility-Administered Medications  Medication Dose Route Frequency Provider Last Rate Last Admin   sacubitril-valsartan (ENTRESTO) 24-26 mg per tablet  1 tablet Oral BID Jodelle Gross, NP        REVIEW OF SYSTEMS:    10 Point review of Systems was done is negative except as noted above.   PHYSICAL EXAMINATION: ECOG PERFORMANCE STATUS: 1 - Symptomatic but completely ambulatory .BP 112/70 (BP Location: Left Arm, Patient Position: Sitting)   Pulse 61   Temp 98 F (36.7 C) (Temporal)   Resp 14   Ht 6' (1.829 m)   Wt 205 lb 9.6 oz (93.3 kg)   SpO2 99%   BMI 27.88 kg/m  GENERAL:alert, in no acute distress and comfortable SKIN: no acute rashes, no significant lesions EYES: conjunctiva are pink and non-injected, sclera anicteric OROPHARYNX: MMM, no exudates, no oropharyngeal erythema or ulceration NECK: supple, no JVD LYMPH:  no palpable lymphadenopathy in the cervical, axillary or inguinal regions LUNGS: clear to auscultation b/l with normal respiratory effort HEART: regular rate & rhythm ABDOMEN:  normoactive bowel sounds , non tender, not distended. Extremity: no pedal edema PSYCH: alert & oriented x 3 with fluent speech NEURO: no focal motor/sensory deficits   LABORATORY DATA:  I have reviewed the data as listed .    Latest Ref Rng & Units 05/21/2022    8:25 AM 04/16/2022    8:19 AM 03/26/2022    8:10 AM  CBC  WBC 4.0 - 10.5 K/uL 5.4  5.4  6.9   Hemoglobin 13.0 - 17.0 g/dL 16.1  09.6  04.5   Hematocrit 39.0 - 52.0 % 35.5  37.0  38.6   Platelets 150 - 400 K/uL 294  220  283    .    Latest Ref Rng & Units 05/21/2022    8:25 AM  04/16/2022    8:19 AM 03/26/2022    8:10 AM  CMP   Glucose 70 - 99 mg/dL 409  811  914   BUN 8 - 23 mg/dL Creatinine 0.61 - 1.24 mg/dL 7.82  9.56  2.13   Sodium 135 - 145 mmol/L 139  141  142   Potassium 3.5 - 5.1 mmol/L 3.9  3.8  3.8   Chloride 98 - 111 mmol/L 108  107  107   CO2 22 - 32 mmol/L Calcium 8.9 - 10.3 mg/dL 9.3  9.2  9.6   Total Protein 6.5 - 8.1 g/dL 6.4  6.3  6.7   Total Bilirubin 0.3 - 1.2 mg/dL 0.4  0.5  0.5   Alkaline Phos 38 - 126 U/L 24  29  33   AST 15 - 41 U/L ALT 0 - 44 U/L Surgical Pathology  THIS IS AN ADDENDUM REPORT  CASE: [id number] PATIENT: Rollins Toepfer Bone Marrow Report Addendum   Reason for Addendum #1:  Additional special stains Reason for Addendum #2:  Cytogenetics results  Clinical History: multiple myeloma   DIAGNOSIS:  BONE MARROW, ASPIRATE, CLOT, CORE: -  Hypercellular marrow involved by plasma cell myeloma -  See comment  PERIPHERAL BLOOD: -  Macrocytic anemia -  See complete blood cell count  COMMENT:  The findings are consistent with recurrence of the patient's previously diagnosed plasma cell myeloma.  For completeness, immunohistochemistry ([id number], kappa and lambda by in situ hybridization) are pending and will be reported in an addendum.  MICROSCOPIC DESCRIPTION:  PERIPHERAL BLOOD SMEAR: The peripheral blood has a macrocytic anemia. There is no significant rouleaux formation identified.  Plasmacytoid lymphocytes are seen.  Platelets are within normal limits.  BONE MARROW ASPIRATE: Spicular, cellular and adequate for evaluation Erythroid precursors: Diminished numbers, no overt dysplasia Granulocytic precursors: Diminished numbers, no overt dysplasia Megakaryocytes: Present Lymphocytes/plasma cells: There is a marked increase of atypical plasma cells.  The plasma cells are enlarged and multinucleated.  There is no lymphocytosis.  TOUCH PREPARATIONS: No additional findings as compared to the  aspirate smears  CLOT AND BIOPSY: Examination of the bone marrow core biopsy and clot section reveals a hypercellular marrow for age (> 95%) involved by plasma cell neoplasm.  Background hematopoiesis is markedly diminished. Megakaryocytes are present.  There are no granulomas identified.  IRON STAIN: Iron stains are performed on a bone marrow aspirate or touch imprint smear and section of clot. The controls stained appropriately.       Storage Iron: Present (aspirate smears)      Ring Sideroblasts: Not identified    RADIOGRAPHIC STUDIES: I have personally reviewed the radiological images as listed and agreed with the findings in the report. No results found. Pet/ct 12/2020  IMPRESSION: 1. No evidence of FDG avid nodal or visceral metastatic disease in the chest, abdomen or pelvis. 2. Numerous osseous lytic lesions are similar in appearance when compared with 2018 prior. No focal FDG uptake above background marrow activity. Findings are compatible with history of multiple myeloma. 3. Coronary artery calcifications of the LAD and circumflex. 4. Mild aortic Atherosclerosis (ICD10-I70.0).     Electronically Signed   By: Allegra Lai M.D.   On: 12/28/2020 12:34   Pet/ct 03/16/2017 Impression   1.  Focal increased FDG uptake in the spinous process of T3 is  consistent with site of multiple myeloma. 2.  No additional sites of increased metabolic activity to suggest cluster of myelomatous cells. 3.  Innumerable lytic lesions in the axial skeleton.    ASSESSMENT & PLAN:   Multiple Myeloma -First diagnosed in 2018 an relapsed in 2022.  Cytogenetics normal male karyotype Mol Cy - not done  PLAN:  -Discussed lab results on 05/21/2022 with patient in detail. CBC normal, showed WBC of 5.4K, hemoglobin of 12.3, and platelets of 294K. -3/13 myeloma lab revealed M-protein undetectable, patient continues to be in remission at this time -CMP normal -answered patients questions  regarding minimal residual disease -lower dose of Dexamethasone to 16 MG -Recommend patient to apply warm compresses and massage groin area to improve pain -recommended patient to consume fresh fruits, vegetables, and fiber-rich foods -Advised patient to take Senna every night to develop a regular bowel routine  -Patient is UTD with all vaccines besides RSV. -Recommended RSV vaccine.   FOLLOW-UP: Please schedule next 4 doses of daratumumab per integrated scheduling. Labs with each treatment MD visit with every other treatment.  Next MD visit in 8 weeks (Patient prefers morning appointments for his treatments and MD visits)  The total time spent in the appointment was 31 minutes* .  All of the patient's questions were answered with apparent satisfaction. The patient knows to call the clinic with any problems, questions or concerns.   Wyvonnia Lora MD MS AAHIVMS [facility] Hematology/Oncology Physician [facility]  .*Total Encounter Time as defined by the Centers for Medicare and Medicaid Services includes, in addition to the face-to-face time of a patient visit (documented in the note above) non-face-to-face time: obtaining and reviewing outside history, ordering and reviewing medications, tests or procedures, care coordination (communications with other health care professionals or caregivers) and documentation in the medical record.    I,Mitra Faeizi,acting as a Neurosurgeon for Wyvonnia Lora, MD.,have documented all relevant documentation on the behalf of Wyvonnia Lora, MD,as directed by  Wyvonnia Lora, MD while in the presence of Wyvonnia Lora, MD.  .I have reviewed the above documentation for accuracy and completeness, and I agree with the above. Johney Maine MD

## 2022-05-22 ENCOUNTER — Ambulatory Visit (INDEPENDENT_AMBULATORY_CARE_PROVIDER_SITE_OTHER): Payer: Medicare Other | Admitting: Gastroenterology

## 2022-05-22 ENCOUNTER — Encounter: Payer: Self-pay | Admitting: Gastroenterology

## 2022-05-22 VITALS — BP 138/82 | HR 78 | Ht 72.0 in | Wt 206.0 lb

## 2022-05-22 DIAGNOSIS — Z86010 Personal history of colonic polyps: Secondary | ICD-10-CM | POA: Diagnosis not present

## 2022-05-22 LAB — KAPPA/LAMBDA LIGHT CHAINS
Kappa free light chain: 11.5 mg/L (ref 3.3–19.4)
Kappa, lambda light chain ratio: 1.92 — ABNORMAL HIGH (ref 0.26–1.65)
Lambda free light chains: 6 mg/L (ref 5.7–26.3)

## 2022-05-22 MED ORDER — NA SULFATE-K SULFATE-MG SULF 17.5-3.13-1.6 GM/177ML PO SOLN: 1.0000 | Freq: Once | ORAL | 0 refills | Status: AC

## 2022-05-22 NOTE — Patient Instructions (Addendum)
You have been scheduled for a colonoscopy. Please follow written instructions given to you at your visit today.  Please pick up your prep supplies at the pharmacy within the next 1-3 days. If you use inhalers (even only as needed), please bring them with you on the day of your procedure.  Take over the counter Miralax daily as needed for constipation.  Please call our office after your echo test if it is abnormal. We would have to reschedule your colonoscopy to the hospital.  The [facility] providers would like to encourage you to use [facility] to communicate with providers for non-urgent requests or questions.  Due to long hold times on the telephone, sending your provider a message by [facility] may be a faster and more efficient way to get a response.  Please allow 48 business hours for a response.  Please remember that this is for non-urgent requests.  Due to recent changes in healthcare laws, you may see the results of your imaging and laboratory studies on MyChart before your provider has had a chance to review them.  We understand that in some cases there may be results that are confusing or concerning to you. Not all laboratory results come back in the same time frame and the provider may be waiting for multiple results in order to interpret others.  Please give [place] 48 hours in order for your provider to thoroughly review all the results before contacting the office for clarification of your results.   Thank you for choosing me and [facility].  Venita Lick. Pleas Koch., MD., Clementeen Graham

## 2022-05-22 NOTE — Progress Notes (Signed)
    Assessment     Personal history of sessile serrated and adenomatous colon polyps Mild constipation  HFrEF, EF 35-40% Relapsed multiple myeloma on chemotherapy   Recommendations    Schedule colonoscopy after echocardiogram on May 1. The risks (including bleeding, perforation, infection, missed lesions, medication reactions and possible hospitalization or surgery if complications occur), benefits, and alternatives to colonoscopy with possible biopsy and possible polypectomy were discussed with the patient and they consent to proceed.   Miralax qd prn    HPI    This is a 72 year old male with a history of sessile serrated and adenomatous colon polyps.  Last colonoscopy was performed in February 2021 with one small tubular adenoma, internal and external hemorrhoids. He is stable on chemotherapy for MM. He experienced reduced EF from carfilzomib which was discontinued in March 2023.  He has mild constipation otherwise no GI complaints. Denies weight loss, abdominal pain, diarrhea, change in stool caliber, melena, hematochezia, nausea, vomiting, dysphagia, reflux symptoms, chest pain.    Labs / Imaging       Latest Ref Rng & Units 05/21/2022    8:25 AM 04/16/2022    8:19 AM 03/26/2022    8:10 AM  Hepatic Function  Total Protein 6.5 - 8.1 g/dL 6.4  6.3  6.7   Albumin 3.5 - 5.0 g/dL 4.3  4.1  4.4   AST 15 - 41 U/L 13  13  13    ALT 0 - 44 U/L 9  9  10    Alk Phosphatase 38 - 126 U/L 24  29  33   Total Bilirubin 0.3 - 1.2 mg/dL 0.4  0.5  0.5        Latest Ref Rng & Units 05/21/2022    8:25 AM 04/16/2022    8:19 AM 03/26/2022    8:10 AM  CBC  WBC 4.0 - 10.5 K/uL 5.4  5.4  6.9   Hemoglobin 13.0 - 17.0 g/dL 93.7  90.2  40.9   Hematocrit 39.0 - 52.0 % 35.5  37.0  38.6   Platelets 150 - 400 K/uL 294  220  283     Current Medications, Allergies, Past Medical History, Past Surgical History, Family History and Social History were reviewed in [employer]  record.   Physical Exam: General: Well developed, well nourished, no acute distress Head: Normocephalic and atraumatic Eyes: Sclerae anicteric, EOMI Ears: Normal auditory acuity Mouth: No deformities or lesions noted Lungs: Clear throughout to auscultation Heart: Regular rate and rhythm; No murmurs, rubs or bruits Abdomen: Soft, non tender and non distended. No masses, hepatosplenomegaly or hernias noted. Normal Bowel sounds Rectal: Deferred to colonoscopy Musculoskeletal: Symmetrical with no gross deformities  Pulses:  Normal pulses noted Extremities: No edema or deformities noted Neurological: Alert oriented x 4, grossly nonfocal Psychological:  Alert and cooperative. Normal mood and affect   Hendy Brindle T. Russella Dar, MD 05/22/2022, 9:19 AM

## 2022-05-23 ENCOUNTER — Other Ambulatory Visit: Payer: Self-pay

## 2022-05-25 LAB — MULTIPLE MYELOMA PANEL, SERUM
Albumin SerPl Elph-Mcnc: 3.6 g/dL (ref 2.9–4.4)
Albumin/Glob SerPl: 1.7 (ref 0.7–1.7)
Alpha 1: 0.2 g/dL (ref 0.0–0.4)
Alpha2 Glob SerPl Elph-Mcnc: 0.7 g/dL (ref 0.4–1.0)
B-Globulin SerPl Elph-Mcnc: 0.8 g/dL (ref 0.7–1.3)
Gamma Glob SerPl Elph-Mcnc: 0.5 g/dL (ref 0.4–1.8)
Globulin, Total: 2.2 g/dL (ref 2.2–3.9)
IgA: 28 mg/dL — ABNORMAL LOW (ref 61–437)
IgG (Immunoglobin G), Serum: 563 mg/dL — ABNORMAL LOW (ref 603–1613)
IgM (Immunoglobulin M), Srm: 18 mg/dL (ref 15–143)
Total Protein ELP: 5.8 g/dL — ABNORMAL LOW (ref 6.0–8.5)

## 2022-05-28 ENCOUNTER — Encounter: Payer: Self-pay | Admitting: Hematology

## 2022-06-02 ENCOUNTER — Telehealth: Payer: Self-pay | Admitting: Cardiovascular Disease

## 2022-06-02 NOTE — Telephone Encounter (Signed)
 *  STAT* If patient is at the pharmacy, call can be transferred to refill team.   1. Which medications need to be refilled? (please list name of each medication and dose if known)   sacubitril-valsartan (ENTRESTO) 24-26 MG   2. Which pharmacy/location (including street and city if local pharmacy) is medication to be sent to?  [id number] - [place], [place] - [address] AT [facility] [address]   3. Do they need a 30 day or 90 day supply?  30

## 2022-06-03 MED ORDER — ENTRESTO 24-26 MG PO TABS: 1.0000 | ORAL_TABLET | Freq: Two times a day (BID) | ORAL | 3 refills | Status: DC

## 2022-06-03 NOTE — Addendum Note (Signed)
Addended by: Derenda Fennel on: 06/03/2022 11:30 AM   Modules accepted: Orders

## 2022-06-03 NOTE — Telephone Encounter (Signed)
Desired medication refilled with quantity of 60 since patient takes two daily and sent to desired pharmacy. I lvm for patient to call in regards to medication.

## 2022-06-04 ENCOUNTER — Ambulatory Visit (HOSPITAL_BASED_OUTPATIENT_CLINIC_OR_DEPARTMENT_OTHER)
Admission: RE | Admit: 2022-06-04 | Discharge: 2022-06-04 | Disposition: A | Discharge status: Home / Self Care | Payer: Medicare Other | Source: Ambulatory Visit | Attending: Adult Health | Admitting: Adult Health

## 2022-06-04 ENCOUNTER — Telehealth: Payer: Self-pay | Admitting: Hematology

## 2022-06-04 DIAGNOSIS — I5020 Unspecified systolic (congestive) heart failure: Secondary | ICD-10-CM | POA: Insufficient documentation

## 2022-06-04 DIAGNOSIS — Z79899 Other long term (current) drug therapy: Secondary | ICD-10-CM | POA: Insufficient documentation

## 2022-06-04 LAB — ECHOCARDIOGRAM COMPLETE
Area-P 1/2: 3.4 cm2
S' Lateral: 3.7 cm

## 2022-06-05 ENCOUNTER — Other Ambulatory Visit: Payer: Self-pay

## 2022-06-06 ENCOUNTER — Telehealth: Payer: Self-pay

## 2022-06-06 NOTE — Telephone Encounter (Addendum)
Called patient regarding results. Left message for patient to call office.----- Message from Kathryn M Lawrence, NP sent at 06/06/2022  7:04 AM EDT ----- I have reviewed the echo report.  The heart pumping function is normal. Some relaxation stiffening but mild. There is mild amount of calcium on the Aortic valve, which is not causing any blood flow problems between the chamber of the heart that pushes the blood out of the heart. This will cause a heart murmur but no concerns here at this time.  KL 

## 2022-06-11 ENCOUNTER — Ambulatory Visit: Payer: TRICARE For Life (TFL)

## 2022-06-11 ENCOUNTER — Other Ambulatory Visit: Payer: TRICARE For Life (TFL)

## 2022-06-11 ENCOUNTER — Telehealth: Payer: Self-pay

## 2022-06-11 NOTE — Telephone Encounter (Addendum)
Called patient regarding results. Left message for patient to call office.----- Message from Jodelle Gross, NP sent at 06/06/2022  7:04 AM EDT ----- I have reviewed the echo report.  The heart pumping function is normal. Some relaxation stiffening but mild. There is mild amount of calcium on the Aortic valve, which is not causing any blood flow problems between the chamber of the heart that pushes the blood out of the heart. This will cause a heart murmur but no concerns here at this time.  KL

## 2022-06-11 NOTE — Telephone Encounter (Signed)
Pt returning call

## 2022-06-12 ENCOUNTER — Encounter: Payer: Self-pay | Admitting: Gastroenterology

## 2022-06-12 ENCOUNTER — Telehealth: Payer: Self-pay | Admitting: Adult Health

## 2022-06-12 NOTE — Telephone Encounter (Signed)
Called patient, advised of ECHO results.   Will notify CMA. Thanks!

## 2022-06-12 NOTE — Telephone Encounter (Signed)
Patient is returning CMA's call regarding his echo results. Advised pt Judeth Cornfield is not in today. He requested a callback from someone who is to go over the results. Please advise.

## 2022-06-18 ENCOUNTER — Inpatient Hospital Stay: Payer: Medicare Other

## 2022-06-18 ENCOUNTER — Inpatient Hospital Stay: Payer: Medicare Other | Attending: Oncology

## 2022-06-18 ENCOUNTER — Other Ambulatory Visit: Payer: Self-pay

## 2022-06-18 VITALS — BP 122/74 | HR 68 | Temp 97.9°F | Resp 14 | Wt 202.5 lb

## 2022-06-18 DIAGNOSIS — Z5112 Encounter for antineoplastic immunotherapy: Secondary | ICD-10-CM | POA: Insufficient documentation

## 2022-06-18 DIAGNOSIS — C9000 Multiple myeloma not having achieved remission: Secondary | ICD-10-CM

## 2022-06-18 DIAGNOSIS — C9002 Multiple myeloma in relapse: Secondary | ICD-10-CM | POA: Diagnosis not present

## 2022-06-18 LAB — CMP (CANCER CENTER ONLY)
ALT: 10 U/L (ref 0–44)
AST: 12 U/L — ABNORMAL LOW (ref 15–41)
Albumin: 4.4 g/dL (ref 3.5–5.0)
Alkaline Phosphatase: 26 U/L — ABNORMAL LOW (ref 38–126)
Anion gap: 6 (ref 5–15)
BUN: 16 mg/dL (ref 8–23)
CO2: 30 mmol/L (ref 22–32)
Calcium: 9.5 mg/dL (ref 8.9–10.3)
Chloride: 106 mmol/L (ref 98–111)
Creatinine: 1 mg/dL (ref 0.61–1.24)
GFR, Estimated: >60 mL/min (ref >60)
Glucose, Bld: 105 mg/dL — ABNORMAL HIGH (ref 70–99)
Potassium: 4.1 mmol/L (ref 3.5–5.1)
Sodium: 142 mmol/L (ref 135–145)
Total Bilirubin: 0.5 mg/dL (ref 0.3–1.2)
Total Protein: 6.5 g/dL (ref 6.5–8.1)

## 2022-06-18 LAB — CBC WITH DIFFERENTIAL (CANCER CENTER ONLY)
Abs Immature Granulocytes: 0.02 10*3/uL (ref 0.00–0.07)
Basophils Absolute: 0 10*3/uL (ref 0.0–0.1)
Basophils Relative: 0 %
Eosinophils Absolute: 0.6 10*3/uL — ABNORMAL HIGH (ref 0.0–0.5)
Eosinophils Relative: 8 %
HCT: 37.9 % — ABNORMAL LOW (ref 39.0–52.0)
Hemoglobin: 12.7 g/dL — ABNORMAL LOW (ref 13.0–17.0)
Immature Granulocytes: 0 %
Lymphocytes Relative: 15 %
Lymphs Abs: 1 10*3/uL (ref 0.7–4.0)
MCH: 32.4 pg (ref 26.0–34.0)
MCHC: 33.5 g/dL (ref 30.0–36.0)
MCV: 96.7 fL (ref 80.0–100.0)
Monocytes Absolute: 1 10*3/uL (ref 0.1–1.0)
Monocytes Relative: 15 %
Neutro Abs: 4.3 10*3/uL (ref 1.7–7.7)
Neutrophils Relative %: 62 %
Platelet Count: 247 10*3/uL (ref 150–400)
RBC: 3.92 MIL/uL — ABNORMAL LOW (ref 4.22–5.81)
RDW: 13.2 % (ref 11.5–15.5)
WBC Count: 6.9 10*3/uL (ref 4.0–10.5)
nRBC: 0 % (ref 0.0–0.2)

## 2022-06-18 MED ORDER — ACETAMINOPHEN 325 MG PO TABS
650.0000 mg | ORAL_TABLET | Freq: Once | ORAL | Status: AC
  Administered 2022-06-18: 650 mg via ORAL
  Filled 2022-06-18: qty 2

## 2022-06-18 MED ORDER — DIPHENHYDRAMINE HCL 25 MG PO CAPS
50.0000 mg | ORAL_CAPSULE | Freq: Once | ORAL | Status: AC
  Administered 2022-06-18: 50 mg via ORAL
  Filled 2022-06-18: qty 2

## 2022-06-18 MED ORDER — DEXAMETHASONE 4 MG PO TABS
16.0000 mg | ORAL_TABLET | Freq: Once | ORAL | Status: AC
  Administered 2022-06-18: 16 mg via ORAL
  Filled 2022-06-18: qty 4

## 2022-06-18 MED ORDER — DARATUMUMAB-HYALURONIDASE-FIHJ 1800-30000 MG-UT/15ML ~~LOC~~ SOLN
1800.0000 mg | Freq: Once | SUBCUTANEOUS | Status: AC
  Administered 2022-06-18: 1800 mg via SUBCUTANEOUS
  Filled 2022-06-18: qty 15

## 2022-06-18 NOTE — Patient Instructions (Addendum)
[facility]  Discharge Instructions: Thank you for choosing [facility] to provide your oncology and hematology care.   If you have a lab appointment with the Cancer Center, please go directly to the Cancer Center and check in at the registration area.   Wear comfortable clothing and clothing appropriate for easy access to any Portacath or PICC line.   We strive to give you quality time with your provider. You may need to reschedule your appointment if you arrive late (15 or more minutes).  Arriving late affects you and other patients whose appointments are after yours.  Also, if you miss three or more appointments without notifying the office, you may be dismissed from the clinic at the provider's discretion.      For prescription refill requests, have your pharmacy contact our office and allow 72 hours for refills to be completed.    Today you received the following chemotherapy and/or immunotherapy agents Darzalex Faspro      To help prevent nausea and vomiting after your treatment, we encourage you to take your nausea medication as directed.  BELOW ARE SYMPTOMS THAT SHOULD BE REPORTED IMMEDIATELY: *FEVER GREATER THAN 100.4 F (38 C) OR HIGHER *CHILLS OR SWEATING *NAUSEA AND VOMITING THAT IS NOT CONTROLLED WITH YOUR NAUSEA MEDICATION *UNUSUAL SHORTNESS OF BREATH *UNUSUAL BRUISING OR BLEEDING *URINARY PROBLEMS (pain or burning when urinating, or frequent urination) *BOWEL PROBLEMS (unusual diarrhea, constipation, pain near the anus) TENDERNESS IN MOUTH AND THROAT WITH OR WITHOUT PRESENCE OF ULCERS (sore throat, sores in mouth, or a toothache) UNUSUAL RASH, SWELLING OR PAIN  UNUSUAL VAGINAL DISCHARGE OR ITCHING   Items with * indicate a potential emergency and should be followed up as soon as possible or go to the Emergency Department if any problems should occur.  Please show the CHEMOTHERAPY ALERT CARD or IMMUNOTHERAPY ALERT CARD at  check-in to the Emergency Department and triage nurse.  Should you have questions after your visit or need to cancel or reschedule your appointment, please contact [facility]  Dept: [phone]  and follow the prompts.  Office hours are 8:00 a.m. to 4:30 p.m. Monday - Friday. Please note that voicemails left after 4:00 p.m. may not be returned until the following business day.  We are closed weekends and major holidays. You have access to a nurse at all times for urgent questions. Please call the main number to the clinic Dept: [phone] and follow the prompts.   For any non-urgent questions, you may also contact your provider using MyChart. We now offer e-Visits for anyone 18 and older to request care online for non-urgent symptoms. For details visit [email or web address].   Also download the MyChart app! Go to the app store, search "MyChart", open the app, select [facility], and log in with your MyChart username and password.  

## 2022-06-19 ENCOUNTER — Telehealth: Payer: Self-pay

## 2022-06-19 NOTE — Telephone Encounter (Addendum)
Results seen by patient via MyChart.----- Message from Jodelle Gross, NP sent at 06/06/2022  7:04 AM EDT ----- I have reviewed the echo report.  The heart pumping function is normal. Some relaxation stiffening but mild. There is mild amount of calcium on the Aortic valve, which is not causing any blood flow problems between the chamber of the heart that pushes the blood out of the heart. This will cause a heart murmur but no concerns here at this time.  KL

## 2022-06-24 ENCOUNTER — Other Ambulatory Visit: Payer: Self-pay

## 2022-06-24 NOTE — Progress Notes (Signed)
Cardiology Clinic Note   Patient Name: Alexander Saunders Date of Encounter: 06/27/2022  Primary Care Provider:  Jackie Plum, MD Primary Cardiologist:  Alexander Batty, MD  Patient Profile   72 year old male patient with history of multiple myeloma status postchemotherapy and stem cell transplant (followed by Alexander Saunders),  Nonischemic cardiomyopathy with, moderately severe LV dysfunction,   EF of 50-55% per echo with grade I diastolic dysfunction on followup echo 06/04/2022  He is on guideline directed medical therapy, with the exception of Jardiance causing UTI symptoms. When last seen by Alexander Saunders carvedilol was increased to 12.5 mg twice daily.  He was placed on Entresto 24/25 mg BID.[name] Followup BMET Potassium 4.1, Creatinine 1.01.   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   Stem cells transplant status (HCC) 06/2017   Past Surgical History:  Procedure Laterality Date   APPENDECTOMY     BONE MARROW BIOPSY  07/2018   COLONOSCOPY     KNEE ARTHROSCOPY     r/knee   LAMINECTOMY N/A 09/04/2016   Procedure: THORACIC FOUR-SIX LAMINECTOMY FOR RESECTION OF TUMOR;  Surgeon: Ditty, Loura Halt, MD;  Location: [facility] OR;  Service: Neurosurgery;  Laterality: N/A;    Allergies  Allergies  Allergen Reactions   Jardiance [Empagliflozin] Other (See Comments)    UTI like symptoms    History of Present Illness    Alexander Saunders is without cardiac complaints today,  He is tolerating the increased dose of Coreg without symptoms. He continues to work out 4-6 days a week, and  walk daily.  He  denies shortness of breath, dizziness or fatigue. No fluid retention. He is having a family issue with his sister at end of life from emphysema and therefore he is traveling to [place] to see her often.  He continues with oncology for multiple myeloma treatment.   Home Medications    Current Outpatient Medications  Medication Sig Dispense Refill   acyclovir (ZOVIRAX) 400 MG tablet Take 1 tablet (400 mg total) by mouth daily. 90 tablet 3   aspirin EC 81 MG tablet Take 81 mg by mouth daily.     bisacodyl (DULCOLAX) 5 MG EC tablet Take 5 mg by mouth daily as needed for moderate constipation.     carboxymethylcellulose (REFRESH PLUS) 0.5 % SOLN Apply 2 drops to eye daily as needed (as needed).     carvedilol (COREG) 12.5 MG tablet Take 1 tablet (12.5 mg total) by mouth 2 (two) times daily. 180 tablet 3   diphenhydramine-acetaminophen (TYLENOL PM EXTRA STRENGTH) 25-500 MG TABS tablet Take 1 tablet by mouth at bedtime as needed (pain).     gabapentin (NEURONTIN) 300 MG capsule TAKE 3 CAPSULES BY MOUTH THREE TIMES DAILY IN THE MORNING AND IN THE AFTERNOON AND AT NIGHT 270 capsule 3   Multiple Vitamin (MULTIVITAMIN) capsule Take 1 capsule by mouth daily.     Na Sulfate-K Sulfate-Mg Sulf 17.5-3.13-1.6 GM/177ML SOLN as needed.     sacubitril-valsartan (ENTRESTO) 24-26 MG Take 1 tablet by mouth 2 (two) times daily.  60 tablet 3   sildenafil (VIAGRA) 50 MG tablet daily as needed.     sodium chloride (OCEAN) 0.65 % SOLN nasal spray Place 1 spray into both nostrils daily as needed for congestion.     spironolactone (ALDACTONE) 25 MG tablet Take 1 tablet (25 mg total) by mouth daily. 90 tablet 3   B Complex Vitamins (B-COMPLEX/B-12) TABS Take 1 tablet by mouth daily. (Patient not taking: Reported on 06/27/2022)     cholecalciferol (VITAMIN D3) 25 MCG (1000 UNIT) tablet Take 1,000 Units by mouth daily. (Patient not taking: Reported on 06/27/2022)     Current Facility-Administered Medications   Medication Dose Route Frequency Provider Last Rate Last Admin   sacubitril-valsartan (ENTRESTO) 24-26 mg per tablet  1 tablet Oral BID Alexander Gross, NP         Family History    Family History  Problem Relation Age of Onset   Hypertension Mother    Cancer Mother    Lung cancer Mother    Hypertension Father    Colon cancer Neg Hx    Colon polyps Neg Hx    Esophageal cancer Neg Hx    Rectal cancer Neg Hx    Stomach cancer Neg Hx    He indicated that his mother is deceased. He indicated that his father is deceased. He indicated that all of his four sisters are alive. He indicated that all of his three brothers are alive. He indicated that his maternal grandmother is deceased. He indicated that his maternal grandfather is deceased. He indicated that his paternal grandmother is deceased. He indicated that his paternal grandfather is deceased. He indicated that his daughter is alive. He indicated that his son is alive. He indicated that the status of his neg hx is unknown.  Social History    Social History   Socioeconomic History   Marital status: Married    Spouse name: Not on file   Number of children: Not on file   Years of education: Not on file   Highest education level: Not on file  Occupational History   Not on file  Tobacco Use   Smoking status: Never   Smokeless tobacco: Never  Vaping Use   Vaping Use: Never used  Substance and Sexual Activity   Alcohol use: Yes    Comment: occ   Drug use: Never   Sexual activity: Not on file  Other Topics Concern   Not on file  Social History Narrative   He worked as a Sports coach level of education:  college   He is back in college at [employer] studying exercise physiology.       Right handed      Single story home   Social Determinants of Health   Financial Resource Strain: Not on file  Food Insecurity: No Food Insecurity (09/11/2021)   Hunger Vital Sign    Worried About Running Out of Food in the Last  Year: Never true    Ran Out of Food in the Last Year: Never true  Transportation Needs: No Transportation Needs (09/11/2021)   PRAPARE - Administrator, Civil Service (Medical): No    Lack of Transportation (Non-Medical): No  Physical Activity: Not on file  Stress: Not on file  Social Connections: Not on file  Intimate Partner Violence: Not on file     Review of Systems    General:  No chills, fever, night sweats or weight changes.  Cardiovascular:  No chest  pain, dyspnea on exertion, edema, orthopnea, palpitations, paroxysmal nocturnal dyspnea. Dermatological: No rash, lesions/masses Respiratory: No cough, dyspnea Urologic: No hematuria, dysuria Abdominal:   No nausea, vomiting, diarrhea, bright red blood per rectum, melena, or hematemesis Neurologic:  No visual changes, wkns, changes in mental status. All other systems reviewed and are otherwise negative except as noted above.     Physical Exam    VS:  BP 116/70 (BP Location: Left Arm, Patient Position: Sitting, Cuff Size: Normal)   Pulse 74   Ht 6' (1.829 m)   Wt 201 lb (91.2 kg)   SpO2 96%   BMI 27.26 kg/m  , BMI Body mass index is 27.26 kg/m.     GEN: Well nourished, well developed, in no acute distress. HEENT: normal. Neck: Supple, no JVD, carotid bruits, or masses. Cardiac: RRR, no murmurs, rubs, or gallops. No clubbing, cyanosis, edema.  Radials/DP/PT 2+ and equal bilaterally.  Respiratory:  Respirations regular and unlabored, clear to auscultation bilaterally. GI: Soft, nontender, nondistended, BS + x 4. MS: no deformity or atrophy. Skin: warm and dry, no rash. Neuro:  Strength and sensation are intact. Psych: Normal affect.  Accessory Clinical Findings    ECG personally reviewed by me today- Not completed this office visit.   Lab Results  Component Value Date   WBC 6.9 06/18/2022   HGB 12.7 (L) 06/18/2022   HCT 37.9 (L) 06/18/2022   MCV 96.7 06/18/2022   PLT 247 06/18/2022   Lab Results   Component Value Date   CREATININE 1.00 06/18/2022   BUN 16 06/18/2022   NA 142 06/18/2022   K 4.1 06/18/2022   CL 106 06/18/2022   CO2 30 06/18/2022   Lab Results  Component Value Date   ALT 10 06/18/2022   AST 12 (L) 06/18/2022   ALKPHOS 26 (L) 06/18/2022   BILITOT 0.5 06/18/2022   No results found for: "CHOL", "HDL", "LDLCALC", "LDLDIRECT", "TRIG", "CHOLHDL"  No results found for: "HGBA1C"  Review of Prior Studies:  Echocardiogram 06/04/2022 1. Left ventricular ejection fraction, by estimation, is 50 to 55%. The  left ventricle has low normal function. The left ventricle has no regional  wall motion abnormalities. There is mild left ventricular hypertrophy.  Left ventricular diastolic  parameters are consistent with Grade I diastolic dysfunction (impaired  relaxation).   2. Right ventricular systolic function is normal. The right ventricular  size is normal. There is normal pulmonary artery systolic pressure.   3. The mitral valve is normal in structure. No evidence of mitral valve  regurgitation. No evidence of mitral stenosis.   4. The aortic valve is calcified. There is mild calcification of the  aortic valve. There is mild thickening of the aortic valve. Aortic valve  regurgitation is not visualized. No aortic stenosis is present.   5. The inferior vena cava is normal in size with greater than 50%  respiratory variability, suggesting right atrial pressure of 3 mmHg.  Assessment & Plan   1.  HFpEF: His is on GDMT with complete recovery of his EF to 50-55% from initial EF of 25%-30% in March 2023.  Will continue current therapy as he is tolerating this without further titration. He is well compensated and has no evidence of volume overload. He will be seen again in September by Alexander Saunders with a repeat Echo prior to the office visit. Refill on all medications are authorized.   2. Hypertension: BP is well controlled with no symptoms of dizziness or fatigue. Continue  Enteresto as  directed along with the Coreg and spironolactone. Follow up labs are being drawn by oncology in 2 weeks, Will review those labs.   3. Hypercholesterolemia:  Currently not on statin therapy.  Follow up labs will be added to oncology blood draw to include lipids and LFTs.            Signed, Bettey Mare. Liborio Nixon, ANP, AACC   06/27/2022 10:06 AM      Office [phone] Fax [phone]  Notice: This dictation was prepared with Dragon dictation along with smaller phrase technology. Any transcriptional errors that result from this process are unintentional and may not be corrected upon review.

## 2022-06-27 ENCOUNTER — Ambulatory Visit: Payer: Medicare Other | Attending: Adult Health | Admitting: Adult Health

## 2022-06-27 ENCOUNTER — Encounter: Payer: Self-pay | Admitting: Adult Health

## 2022-06-27 VITALS — BP 116/70 | HR 74 | Ht 72.0 in | Wt 201.0 lb

## 2022-06-27 DIAGNOSIS — I10 Essential (primary) hypertension: Secondary | ICD-10-CM

## 2022-06-27 DIAGNOSIS — I5020 Unspecified systolic (congestive) heart failure: Secondary | ICD-10-CM

## 2022-06-27 DIAGNOSIS — E7800 Pure hypercholesterolemia, unspecified: Secondary | ICD-10-CM

## 2022-06-27 NOTE — Patient Instructions (Signed)
Medication Instructions:  No Changes *If you need a refill on your cardiac medications before your next appointment, please call your pharmacy*   Lab Work: Lipid Panel. To Be Done 2 weeks If you have labs (blood work) drawn today and your tests are completely normal, you will receive your results only by: MyChart Message (if you have MyChart) OR A paper copy in the mail If you have any lab test that is abnormal or we need to change your treatment, we will call you to review the results.   Testing/Procedures: [address]. ( August 2024) Your physician has requested that you have an echocardiogram. Echocardiography is a painless test that uses sound waves to create images of your heart. It provides your doctor with information about the size and shape of your heart and how well your heart's chambers and valves are working. This procedure takes approximately one hour. There are no restrictions for this procedure. Please do NOT wear cologne, perfume, aftershave, or lotions (deodorant is allowed). Please arrive 15 minutes prior to your appointment time.    Follow-Up: At [facility], you and your health needs are our priority.  As part of our continuing mission to provide you with exceptional heart care, we have created designated Provider Care Teams.  These Care Teams include your primary Cardiologist (physician) and Advanced Practice Providers (APPs -  Physician Assistants and Nurse Practitioners) who all work together to provide you with the care you need, when you need it.  We recommend signing up for the patient portal called "MyChart".  Sign up information is provided on this After Visit Summary.  MyChart is used to connect with patients for Virtual Visits (Telemedicine).  Patients are able to view lab/test results, encounter notes, upcoming appointments, etc.  Non-urgent messages can be sent to your provider as well.   To learn more about what you can do with  MyChart, go to [email or web address].    Your next appointment:   4 month(s)  Provider:   Nanetta Batty, MD

## 2022-06-28 ENCOUNTER — Other Ambulatory Visit: Payer: Self-pay

## 2022-07-04 ENCOUNTER — Telehealth: Payer: Self-pay | Admitting: Hematology

## 2022-07-09 ENCOUNTER — Ambulatory Visit: Payer: TRICARE For Life (TFL) | Admitting: Hematology

## 2022-07-09 ENCOUNTER — Ambulatory Visit: Payer: TRICARE For Life (TFL)

## 2022-07-09 ENCOUNTER — Other Ambulatory Visit: Payer: TRICARE For Life (TFL)

## 2022-07-14 ENCOUNTER — Encounter: Payer: TRICARE For Life (TFL) | Admitting: Gastroenterology

## 2022-07-15 ENCOUNTER — Inpatient Hospital Stay (HOSPITAL_BASED_OUTPATIENT_CLINIC_OR_DEPARTMENT_OTHER): Payer: Medicare Other | Admitting: Hematology

## 2022-07-15 ENCOUNTER — Inpatient Hospital Stay: Payer: Medicare Other

## 2022-07-15 ENCOUNTER — Other Ambulatory Visit: Payer: Self-pay

## 2022-07-15 ENCOUNTER — Inpatient Hospital Stay: Payer: Medicare Other | Attending: Oncology

## 2022-07-15 VITALS — BP 106/68 | HR 69 | Temp 97.7°F | Resp 18 | Ht 72.0 in | Wt 204.1 lb

## 2022-07-15 DIAGNOSIS — D649 Anemia, unspecified: Secondary | ICD-10-CM | POA: Insufficient documentation

## 2022-07-15 DIAGNOSIS — Z5112 Encounter for antineoplastic immunotherapy: Secondary | ICD-10-CM

## 2022-07-15 DIAGNOSIS — C9000 Multiple myeloma not having achieved remission: Secondary | ICD-10-CM

## 2022-07-15 DIAGNOSIS — C9002 Multiple myeloma in relapse: Secondary | ICD-10-CM | POA: Diagnosis not present

## 2022-07-15 LAB — CMP (CANCER CENTER ONLY)
ALT: 8 U/L (ref 0–44)
AST: 11 U/L — ABNORMAL LOW (ref 15–41)
Albumin: 4 g/dL (ref 3.5–5.0)
Alkaline Phosphatase: 26 U/L — ABNORMAL LOW (ref 38–126)
Anion gap: 7 (ref 5–15)
BUN: 15 mg/dL (ref 8–23)
CO2: 26 mmol/L (ref 22–32)
Calcium: 9.3 mg/dL (ref 8.9–10.3)
Chloride: 109 mmol/L (ref 98–111)
Creatinine: 0.86 mg/dL (ref 0.61–1.24)
GFR, Estimated: >60 mL/min (ref >60)
Glucose, Bld: 118 mg/dL — ABNORMAL HIGH (ref 70–99)
Potassium: 3.7 mmol/L (ref 3.5–5.1)
Sodium: 142 mmol/L (ref 135–145)
Total Bilirubin: 0.4 mg/dL (ref 0.3–1.2)
Total Protein: 6 g/dL — ABNORMAL LOW (ref 6.5–8.1)

## 2022-07-15 LAB — CBC WITH DIFFERENTIAL (CANCER CENTER ONLY)
Abs Immature Granulocytes: 0.01 10*3/uL (ref 0.00–0.07)
Basophils Absolute: 0 10*3/uL (ref 0.0–0.1)
Basophils Relative: 1 %
Eosinophils Absolute: 0.4 10*3/uL (ref 0.0–0.5)
Eosinophils Relative: 6 %
HCT: 35.7 % — ABNORMAL LOW (ref 39.0–52.0)
Hemoglobin: 12 g/dL — ABNORMAL LOW (ref 13.0–17.0)
Immature Granulocytes: 0 %
Lymphocytes Relative: 16 %
Lymphs Abs: 1 10*3/uL (ref 0.7–4.0)
MCH: 32.1 pg (ref 26.0–34.0)
MCHC: 33.6 g/dL (ref 30.0–36.0)
MCV: 95.5 fL (ref 80.0–100.0)
Monocytes Absolute: 1 10*3/uL (ref 0.1–1.0)
Monocytes Relative: 16 %
Neutro Abs: 3.9 10*3/uL (ref 1.7–7.7)
Neutrophils Relative %: 61 %
Platelet Count: 209 10*3/uL (ref 150–400)
RBC: 3.74 MIL/uL — ABNORMAL LOW (ref 4.22–5.81)
RDW: 13.6 % (ref 11.5–15.5)
WBC Count: 6.2 10*3/uL (ref 4.0–10.5)
nRBC: 0 % (ref 0.0–0.2)

## 2022-07-15 MED ORDER — DEXAMETHASONE 4 MG PO TABS
12.0000 mg | ORAL_TABLET | Freq: Once | ORAL | Status: AC
  Administered 2022-07-15: 12 mg via ORAL
  Filled 2022-07-15: qty 3

## 2022-07-15 MED ORDER — ACETAMINOPHEN 325 MG PO TABS
650.0000 mg | ORAL_TABLET | Freq: Once | ORAL | Status: AC
  Administered 2022-07-15: 650 mg via ORAL
  Filled 2022-07-15: qty 2

## 2022-07-15 MED ORDER — DARATUMUMAB-HYALURONIDASE-FIHJ 1800-30000 MG-UT/15ML ~~LOC~~ SOLN
1800.0000 mg | Freq: Once | SUBCUTANEOUS | Status: AC
  Administered 2022-07-15: 1800 mg via SUBCUTANEOUS
  Filled 2022-07-15: qty 15

## 2022-07-15 MED ORDER — DIPHENHYDRAMINE HCL 25 MG PO CAPS
50.0000 mg | ORAL_CAPSULE | Freq: Once | ORAL | Status: AC
  Administered 2022-07-15: 50 mg via ORAL
  Filled 2022-07-15: qty 2

## 2022-07-15 NOTE — Patient Instructions (Signed)
[facility]  Discharge Instructions: Thank you for choosing [facility] to provide your oncology and hematology care.   If you have a lab appointment with the Cancer Center, please go directly to the Cancer Center and check in at the registration area.   Wear comfortable clothing and clothing appropriate for easy access to any Portacath or PICC line.   We strive to give you quality time with your provider. You may need to reschedule your appointment if you arrive late (15 or more minutes).  Arriving late affects you and other patients whose appointments are after yours.  Also, if you miss three or more appointments without notifying the office, you may be dismissed from the clinic at the provider's discretion.      For prescription refill requests, have your pharmacy contact our office and allow 72 hours for refills to be completed.    Today you received the following chemotherapy and/or immunotherapy agents Darzalex Faspro      To help prevent nausea and vomiting after your treatment, we encourage you to take your nausea medication as directed.  BELOW ARE SYMPTOMS THAT SHOULD BE REPORTED IMMEDIATELY: *FEVER GREATER THAN 100.4 F (38 C) OR HIGHER *CHILLS OR SWEATING *NAUSEA AND VOMITING THAT IS NOT CONTROLLED WITH YOUR NAUSEA MEDICATION *UNUSUAL SHORTNESS OF BREATH *UNUSUAL BRUISING OR BLEEDING *URINARY PROBLEMS (pain or burning when urinating, or frequent urination) *BOWEL PROBLEMS (unusual diarrhea, constipation, pain near the anus) TENDERNESS IN MOUTH AND THROAT WITH OR WITHOUT PRESENCE OF ULCERS (sore throat, sores in mouth, or a toothache) UNUSUAL RASH, SWELLING OR PAIN  UNUSUAL VAGINAL DISCHARGE OR ITCHING   Items with * indicate a potential emergency and should be followed up as soon as possible or go to the Emergency Department if any problems should occur.  Please show the CHEMOTHERAPY ALERT CARD or IMMUNOTHERAPY ALERT CARD at  check-in to the Emergency Department and triage nurse.  Should you have questions after your visit or need to cancel or reschedule your appointment, please contact [facility]  Dept: [phone]  and follow the prompts.  Office hours are 8:00 a.m. to 4:30 p.m. Monday - Friday. Please note that voicemails left after 4:00 p.m. may not be returned until the following business day.  We are closed weekends and major holidays. You have access to a nurse at all times for urgent questions. Please call the main number to the clinic Dept: [phone] and follow the prompts.   For any non-urgent questions, you may also contact your provider using MyChart. We now offer e-Visits for anyone 18 and older to request care online for non-urgent symptoms. For details visit [email or web address].   Also download the MyChart app! Go to the app store, search "MyChart", open the app, select [facility], and log in with your MyChart username and password.  

## 2022-07-15 NOTE — Progress Notes (Signed)
HEMATOLOGY/ONCOLOGY CLINIC NOTE  Date of Service: 07/15/22  Patient Care Team: Jackie Plum, MD as PCP - General (Internal Medicine) Runell Gess, MD as PCP - Cardiology (Cardiology)  CHIEF COMPLAINTS/PURPOSE OF CONSULTATION:  Evaluation and management of Kappa Light Chain Multiple Myeloma  HISTORY OF PRESENTING ILLNESS:   Alexander Saunders is a wonderful 72 y.o. male who is here for evaluation and management of multiple myeloma. Patient was following with Dr. Clelia Croft and has been transferred to [place]. Patient was diagnosed with multiple myeloma in 2018 which relapsed in 2022 with prior treatment as noted below.  Prior Therapy:   He is status post surgical decompression of an epidural mass on 09/04/2016 and the pathology showed a plasma cell neoplasm.   He is S/P adjuvant radiation therapy to the thoracic spine to be completed on 10/14/2016.   Velcade 1.5 mg/m weekly with dexamethasone 20 mg and Cytoxan 600 mg po. Therapy started on 10/24/2016.    High-dose chemotherapy followed by stem cell transplant: Melphalan 200 mg/m2 completed on 06/17/17 followed by autologous stem cell rescue 06/18/17.  He achieved complete response.     Zometa 4 mg every 3 months started in February 2020.  Last treatment given in January 2022.     Revlimid 10 mg daily for 21 days out of a 28-day started in February 2020.  Therapy discontinued in November 2022 because of worsening anemia.   Carfilzomib 20 mg/m weekly started on February 19, 2021.  Therapy stopped in March 2023 for presumed cardiomyopathy complication.     Daratumumab 1800 mg subcutaneous injection weekly started on February 19, 2021 with Dexamethasone weekly.     Current treatment: Patient is currently on monthly Daratumumab 1800 mg subcutaneous injections per protocol currently every 28 days with Dexamethasone weekly 20 mg.  Patient notes he is doing well overall without any new medical concern since his last visit with Dr.  Clelia Croft. He complains of weight gain from Dexamethasone, which he is currently trying to loss. He denies any toxicities with Daratumumab.   Patient notes that he tested positive for COVID-19 around 2-3 months and notes that his symptoms have improved since.  He denies fever, chills, night sweats, abdominal pain, back pain, chest pain, or leg swelling. However, he does complain of intermittent hip pain.  He has received his influenza vaccine, but denies COVID-19 Booster ad RSV vaccine.    INTERVAL HISTORY:  Alexander Saunders is a wonderful 72 y.o. male who is here for evaluation and management of multiple myeloma.   Patient was last seen by me on 05/21/2022 and he complained of constipation, soft stools, mild knee pain which he attributed to his weight, and hip/groin pain after hiking.  Patient notes he has been doing well overall since our last visit. Patient complains of left groin pain which he attributes it to muscle strain after hiking. He notes that his pain is better when he walks.   He denies any new infection issues, fever, chills, night sweats, unexpected weight loss, abdominal pain, chest pain, back pain, or leg swelling. He does complain of back tenderness, but notes it has been slowly improving.    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   Stem cells transplant status 06/2017    SURGICAL HISTORY: Past Surgical History:  Procedure Laterality Date   APPENDECTOMY     BONE MARROW BIOPSY  07/2018   COLONOSCOPY     KNEE ARTHROSCOPY     r/knee   LAMINECTOMY N/A 09/04/2016   Procedure:  THORACIC FOUR-SIX LAMINECTOMY FOR RESECTION OF TUMOR;  Surgeon: Ditty, Loura Halt, MD;  Location: [facility] OR;  Service: Neurosurgery;  Laterality: N/A;    SOCIAL HISTORY: Social History   Socioeconomic History   Marital status: Married    Spouse name: Not on file   Number of children: Not on file   Years of education: Not on file   Highest education level: Not on file  Occupational History   Not on file  Tobacco Use   Smoking status: Never   Smokeless tobacco: Never  Vaping Use   Vaping Use: Never used  Substance and Sexual Activity   Alcohol use: Yes    Comment: occ   Drug use: Never   Sexual activity: Not on file  Other Topics Concern   Not on file  Social History Narrative   He worked as a Sports coach level of education:  college   He is back in college at [employer] studying exercise physiology.       Right handed      Single story home   Social Determinants of Health   Financial Resource Strain: Not on file  Food Insecurity: No Food Insecurity (09/11/2021)   Hunger Vital Sign    Worried About Running Out of Food in the Last Year: Never true    Ran Out of Food in the Last Year: Never true  Transportation Needs: No Transportation Needs (09/11/2021)   PRAPARE - Administrator, Civil Service (Medical): No    Lack of Transportation (Non-Medical): No  Physical Activity: Not on file  Stress: Not on file  Social Connections: Not on file  Intimate Partner Violence: Not on file    FAMILY HISTORY: Family History  Problem Relation Age of Onset   Hypertension Mother    Cancer Mother    Lung cancer Mother    Hypertension Father    Colon cancer Neg Hx    Colon polyps Neg Hx    Esophageal cancer Neg Hx    Rectal cancer Neg Hx    Stomach cancer Neg Hx     ALLERGIES:  is allergic to jardiance [empagliflozin].  MEDICATIONS:  Current Outpatient Medications  Medication Sig Dispense Refill   acyclovir (ZOVIRAX) 400 MG tablet Take 1 tablet (400 mg  total) by mouth daily. 90 tablet 3   aspirin EC 81 MG tablet Take 81 mg by mouth daily.     B Complex Vitamins (B-COMPLEX/B-12) TABS Take 1 tablet by mouth daily.     bisacodyl (DULCOLAX) 5 MG EC tablet Take 5 mg by mouth daily as needed for moderate constipation.     carboxymethylcellulose (REFRESH PLUS) 0.5 % SOLN Place 1 drop into both eyes 3 (three) times daily as needed (dry eyes).     carboxymethylcellulose (REFRESH PLUS) 0.5 % SOLN Apply 2 drops to eye daily as needed (as needed).     carvedilol (COREG) 12.5 MG tablet Take 1 tablet (12.5 mg total) by mouth 2 (two) times daily. 180 tablet 3   cholecalciferol (VITAMIN  D3) 25 MCG (1000 UNIT) tablet Take 1,000 Units by mouth daily.     diphenhydramine-acetaminophen (TYLENOL PM EXTRA STRENGTH) 25-500 MG TABS tablet Take 1 tablet by mouth at bedtime as needed (pain).     gabapentin (NEURONTIN) 300 MG capsule TAKE 3 CAPSULES BY MOUTH THREE TIMES DAILY IN THE MORNING AND IN THE AFTERNOON AND AT NIGHT 270 capsule 3   Multiple Vitamin (MULTIVITAMIN) capsule Take 1 capsule by mouth daily.     sildenafil (VIAGRA) 50 MG tablet daily as needed. (Patient not taking: Reported on 05/16/2022)     sodium chloride (OCEAN) 0.65 % SOLN nasal spray Place 1 spray into both nostrils daily as needed for congestion.     spironolactone (ALDACTONE) 25 MG tablet Take 1 tablet (25 mg total) by mouth daily. 90 tablet 3   Current Facility-Administered Medications  Medication Dose Route Frequency Provider Last Rate Last Admin   sacubitril-valsartan (ENTRESTO) 24-26 mg per tablet  1 tablet Oral BID Jodelle Gross, NP        REVIEW OF SYSTEMS:    10 Point review of Systems was done is negative except as noted above.   PHYSICAL EXAMINATION: ECOG PERFORMANCE STATUS: 1 - Symptomatic but completely ambulatory .BP 112/70 (BP Location: Left Arm, Patient Position: Sitting)   Pulse 61   Temp 98 F (36.7 C) (Temporal)   Resp 14   Ht 6' (1.829 m)   Wt 205 lb 9.6 oz  (93.3 kg)   SpO2 99%   BMI 27.88 kg/m  GENERAL:alert, in no acute distress and comfortable SKIN: no acute rashes, no significant lesions EYES: conjunctiva are pink and non-injected, sclera anicteric OROPHARYNX: MMM, no exudates, no oropharyngeal erythema or ulceration NECK: supple, no JVD LYMPH:  no palpable lymphadenopathy in the cervical, axillary or inguinal regions LUNGS: clear to auscultation b/l with normal respiratory effort HEART: regular rate & rhythm ABDOMEN:  normoactive bowel sounds , non tender, not distended. Extremity: no pedal edema PSYCH: alert & oriented x 3 with fluent speech NEURO: no focal motor/sensory deficits   LABORATORY DATA:  I have reviewed the data as listed .    Latest Ref Rng & Units 07/15/2022   10:02 AM 06/18/2022    7:38 AM 05/21/2022    8:25 AM  CBC  WBC 4.0 - 10.5 K/uL 6.2  6.9  5.4   Hemoglobin 13.0 - 17.0 g/dL 16.1  09.6  04.5   Hematocrit 39.0 - 52.0 % 35.7  37.9  35.5   Platelets 150 - 400 K/uL 209  247  294    .    Latest Ref Rng & Units 07/15/2022   10:02 AM 06/18/2022    7:38 AM 05/21/2022    8:25 AM  CMP  Glucose 70 - 99 mg/dL 409  811  914   BUN 8 - 23 mg/dL 15  16  16    Creatinine 0.61 - 1.24 mg/dL 7.82  9.56  2.13   Sodium 135 - 145 mmol/L 142  142  139   Potassium 3.5 - 5.1 mmol/L 3.7  4.1  3.9   Chloride 98 - 111 mmol/L 109  106  108   CO2 22 - 32 mmol/L 26  30  26    Calcium 8.9 - 10.3 mg/dL 9.3  9.5  9.3   Total Protein 6.5 - 8.1 g/dL 6.0  6.5  6.4   Total Bilirubin 0.3 - 1.2 mg/dL 0.4  0.5  0.4   Alkaline Phos 38 - 126 U/L 26  26  24   AST 15 - 41 U/L 11  12  13    ALT 0 - 44 U/L 8  10  9      Surgical Pathology  THIS IS AN ADDENDUM REPORT  CASE: [id number] PATIENT: Latavius Wooley Bone Marrow Report Addendum   Reason for Addendum #1:  Additional special stains Reason for Addendum #2:  Cytogenetics results  Clinical History: multiple myeloma   DIAGNOSIS:  BONE MARROW, ASPIRATE, CLOT, CORE: -   Hypercellular marrow involved by plasma cell myeloma -  See comment  PERIPHERAL BLOOD: -  Macrocytic anemia -  See complete blood cell count  COMMENT:  The findings are consistent with recurrence of the patient's previously diagnosed plasma cell myeloma.  For completeness, immunohistochemistry (CD138, kappa and lambda by in situ hybridization) are pending and will be reported in an addendum.  MICROSCOPIC DESCRIPTION:  PERIPHERAL BLOOD SMEAR: The peripheral blood has a macrocytic anemia. There is no significant rouleaux formation identified.  Plasmacytoid lymphocytes are seen.  Platelets are within normal limits.  BONE MARROW ASPIRATE: Spicular, cellular and adequate for evaluation Erythroid precursors: Diminished numbers, no overt dysplasia Granulocytic precursors: Diminished numbers, no overt dysplasia Megakaryocytes: Present Lymphocytes/plasma cells: There is a marked increase of atypical plasma cells.  The plasma cells are enlarged and multinucleated.  There is no lymphocytosis.  TOUCH PREPARATIONS: No additional findings as compared to the aspirate smears  CLOT AND BIOPSY: Examination of the bone marrow core biopsy and clot section reveals a hypercellular marrow for age (> 95%) involved by plasma cell neoplasm.  Background hematopoiesis is markedly diminished. Megakaryocytes are present.  There are no granulomas identified.  IRON STAIN: Iron stains are performed on a bone marrow aspirate or touch imprint smear and section of clot. The controls stained appropriately.       Storage Iron: Present (aspirate smears)      Ring Sideroblasts: Not identified    RADIOGRAPHIC STUDIES: I have personally reviewed the radiological images as listed and agreed with the findings in the report. No results found. Pet/ct 12/2020  IMPRESSION: 1. No evidence of FDG avid nodal or visceral metastatic disease in the chest, abdomen or pelvis. 2. Numerous osseous lytic lesions are similar  in appearance when compared with 2018 prior. No focal FDG uptake above background marrow activity. Findings are compatible with history of multiple myeloma. 3. Coronary artery calcifications of the LAD and circumflex. 4. Mild aortic Atherosclerosis (ICD10-I70.0).     Electronically Signed   By: Allegra Lai M.D.   On: 12/28/2020 12:34   Pet/ct 03/16/2017 Impression   1.  Focal increased FDG uptake in the spinous process of T3 is consistent with site of multiple myeloma. 2.  No additional sites of increased metabolic activity to suggest cluster of myelomatous cells. 3.  Innumerable lytic lesions in the axial skeleton.    ASSESSMENT & PLAN:   Multiple Myeloma -First diagnosed in 2018 an relapsed in 2022.  Cytogenetics normal male karyotype Mol Cy - not done  PLAN:  -Discussed lab results from today, 07/15/2022, with the patient. CBC shows hemoglobin at 12.0 g/dL and hematocrit at 09.8%.  CMP is stable Myeloma panel 4/17- no M spike K/L light chains wnl and ratio WNL -Will cut down pre treatment Dexamethasone dose from 16 mg to 12 mg.  -Recommend patient to apply warm compresses and massage groin area to improve pain.  FOLLOW-UP: Per integrated scheduling  The total time spent in the appointment was 25 minutes* .  All of the patient's questions  were answered with apparent satisfaction. The patient knows to call the clinic with any problems, questions or concerns.   Wyvonnia Lora MD MS AAHIVMS [facility] Hematology/Oncology Physician [facility]  .*Total Encounter Time as defined by the Centers for Medicare and Medicaid Services includes, in addition to the face-to-face time of a patient visit (documented in the note above) non-face-to-face time: obtaining and reviewing outside history, ordering and reviewing medications, tests or procedures, care coordination (communications with other health care professionals or caregivers) and documentation in the  medical record.   I, Ok Edwards, am acting as a Neurosurgeon for Wyvonnia Lora, MD.  .I have reviewed the above documentation for accuracy and completeness, and I agree with the above. Johney Maine MD

## 2022-07-15 NOTE — Progress Notes (Signed)
Patient seen by Dr. Kale  Vitals are within treatment parameters.  Labs reviewed: and are within treatment parameters.  Per physician team, patient is ready for treatment and there are NO modifications to the treatment plan.  

## 2022-07-17 DIAGNOSIS — Z125 Encounter for screening for malignant neoplasm of prostate: Secondary | ICD-10-CM | POA: Diagnosis not present

## 2022-07-17 DIAGNOSIS — I10 Essential (primary) hypertension: Secondary | ICD-10-CM | POA: Diagnosis not present

## 2022-07-17 DIAGNOSIS — Z0001 Encounter for general adult medical examination with abnormal findings: Secondary | ICD-10-CM | POA: Diagnosis not present

## 2022-07-17 DIAGNOSIS — I5022 Chronic systolic (congestive) heart failure: Secondary | ICD-10-CM | POA: Diagnosis not present

## 2022-07-17 DIAGNOSIS — D638 Anemia in other chronic diseases classified elsewhere: Secondary | ICD-10-CM | POA: Diagnosis not present

## 2022-07-17 DIAGNOSIS — G620 Drug-induced polyneuropathy: Secondary | ICD-10-CM | POA: Diagnosis not present

## 2022-07-21 ENCOUNTER — Encounter: Payer: Self-pay | Admitting: Hematology

## 2022-07-23 ENCOUNTER — Encounter: Payer: Self-pay | Admitting: Gastroenterology

## 2022-07-23 ENCOUNTER — Ambulatory Visit (AMBULATORY_SURGERY_CENTER): Payer: Medicare Other | Admitting: Gastroenterology

## 2022-07-23 VITALS — BP 120/63 | HR 74 | Temp 99.1°F | Resp 9 | Ht 72.0 in | Wt 206.0 lb

## 2022-07-23 DIAGNOSIS — Z86010 Personal history of colonic polyps: Secondary | ICD-10-CM | POA: Diagnosis not present

## 2022-07-23 DIAGNOSIS — Z09 Encounter for follow-up examination after completed treatment for conditions other than malignant neoplasm: Secondary | ICD-10-CM

## 2022-07-23 DIAGNOSIS — K640 First degree hemorrhoids: Secondary | ICD-10-CM | POA: Diagnosis not present

## 2022-07-23 DIAGNOSIS — D120 Benign neoplasm of cecum: Secondary | ICD-10-CM

## 2022-07-23 MED ORDER — SODIUM CHLORIDE 0.9 % IV SOLN
500.0000 mL | Freq: Once | INTRAVENOUS | Status: DC
Start: 2022-07-23 — End: 2022-07-23

## 2022-07-23 NOTE — Progress Notes (Signed)
See 07/15/2022 H&P, no changes

## 2022-07-23 NOTE — Patient Instructions (Addendum)
Continue present medications. Await pathology results. Please read handout about polyps and hemorrhoids   YOU HAD AN ENDOSCOPIC PROCEDURE TODAY AT THE [facility]:   Refer to the procedure report that was given to you for any specific questions about what was found during the examination.  If the procedure report does not answer your questions, please call your gastroenterologist to clarify.  If you requested that your care partner not be given the details of your procedure findings, then the procedure report has been included in a sealed envelope for you to review at your convenience later.  YOU SHOULD EXPECT: Some feelings of bloating in the abdomen. Passage of more gas than usual.  Walking can help get rid of the air that was put into your GI tract during the procedure and reduce the bloating. If you had a lower endoscopy (such as a colonoscopy or flexible sigmoidoscopy) you may notice spotting of blood in your stool or on the toilet paper. If you underwent a bowel prep for your procedure, you may not have a normal bowel movement for a few days.  Please Note:  You might notice some irritation and congestion in your nose or some drainage.  This is from the oxygen used during your procedure.  There is no need for concern and it should clear up in a day or so.  SYMPTOMS TO REPORT IMMEDIATELY:  Following lower endoscopy (colonoscopy or flexible sigmoidoscopy):  Excessive amounts of blood in the stool  Significant tenderness or worsening of abdominal pains  Swelling of the abdomen that is new, acute  Fever of 100F or higher  For urgent or emergent issues, a gastroenterologist can be reached at any hour by calling [phone]. Do not use MyChart messaging for urgent concerns.    DIET:  We do recommend a small meal at first, but then you may proceed to your regular diet.  Drink plenty of fluids but you should avoid alcoholic beverages for 24 hours.  ACTIVITY:  You should plan to  take it easy for the rest of today and you should NOT DRIVE or use heavy machinery until tomorrow (because of the sedation medicines used during the test).    FOLLOW UP: Our staff will call the number listed on your records the next business day following your procedure.  We will call around 7:15- 8:00 am to check on you and address any questions or concerns that you may have regarding the information given to you following your procedure. If we do not reach you, we will leave a message.     If any biopsies were taken you will be contacted by phone or by letter within the next 1-3 weeks.  Please call us at [phone] if you have not heard about the biopsies in 3 weeks.    SIGNATURES/CONFIDENTIALITY: You and/or your care partner have signed paperwork which will be entered into your electronic medical record.  These signatures attest to the fact that that the information above on your After Visit Summary has been reviewed and is understood.  Full responsibility of the confidentiality of this discharge information lies with you and/or your care-partner.

## 2022-07-23 NOTE — Progress Notes (Signed)
Pt's states no medical or surgical changes since previsit or office visit. 

## 2022-07-23 NOTE — Op Note (Signed)
[facility] Patient Name: Alexander Saunders Procedure Date: 07/23/2022 10:24 AM MRN: [id number] Endoscopist: Meryl Dare , MD, [id number] Age: 72 Referring MD:  Date of Birth: 1950-05-07 Gender: Male Account #: [id number] Procedure:                Colonoscopy Indications:              Surveillance: Personal history of adenomatous                            polyps on last colonoscopy 3 years ago Medicines:                Monitored Anesthesia Care Procedure:                Pre-Anesthesia Assessment:                           - Prior to the procedure, a History and Physical                            was performed, and patient medications and                            allergies were reviewed. The patient's tolerance of                            previous anesthesia was also reviewed. The risks                            and benefits of the procedure and the sedation                            options and risks were discussed with the patient.                            All questions were answered, and informed consent                            was obtained. Prior Anticoagulants: The patient has                            taken no anticoagulant or antiplatelet agents. ASA                            Grade Assessment: III - A patient with severe                            systemic disease. After reviewing the risks and                            benefits, the patient was deemed in satisfactory                            condition to undergo the procedure.  After obtaining informed consent, the colonoscope                            was passed under direct vision. Throughout the                            procedure, the patient's blood pressure, pulse, and                            oxygen saturations were monitored continuously. The                            CF [id number] #[id number] was introduced through the anus                            and advanced to  the the cecum, identified by                            appendiceal orifice and ileocecal valve. The                            ileocecal valve, appendiceal orifice, and rectum                            were photographed. The quality of the bowel                            preparation was adequate. The colonoscopy was                            performed without difficulty. The patient tolerated                            the procedure well. Scope In: 10:44:04 AM Scope Out: 11:04:04 AM Scope Withdrawal Time: 0 hours 15 minutes 17 seconds  Total Procedure Duration: 0 hours 20 minutes 0 seconds  Findings:                 The perianal and digital rectal examinations were                            normal.                           A 6 mm polyp was found in the cecum. The polyp was                            sessile. The polyp was removed with a cold snare.                            Resection and retrieval were complete.                           External and internal hemorrhoids were found during  retroflexion. The hemorrhoids were small and Grade                            I (internal hemorrhoids that do not prolapse).                           The exam was otherwise without abnormality on                            direct and retroflexion views. Complications:            No immediate complications. Estimated blood loss:                            None. Estimated Blood Loss:     Estimated blood loss: none. Impression:               - One 6 mm polyp in the cecum, removed with a cold                            snare. Resected and retrieved.                           - External and internal hemorrhoids.                           - The examination was otherwise normal on direct                            and retroflexion views. Recommendation:           - Consider repeat colonoscopy vs no repeat due to                            age after studies are complete for  surveillance                            based on pathology results.                           - Patient has a contact number available for                            emergencies. The signs and symptoms of potential                            delayed complications were discussed with the                            patient. Return to normal activities tomorrow.                            Written discharge instructions were provided to the                            patient.                           -  Resume previous diet.                           - Continue present medications.                           - Await pathology results. Meryl Dare, MD 07/23/2022 11:08:40 AM This report has been signed electronically.

## 2022-07-23 NOTE — Progress Notes (Signed)
Uneventful anesthetic. Report to pacu rn. Vss. Care resumed by rn. 

## 2022-07-23 NOTE — Progress Notes (Signed)
Called to room to assist during endoscopic procedure.  Patient ID and intended procedure confirmed with present staff. Received instructions for my participation in the procedure from the performing physician.  

## 2022-07-24 ENCOUNTER — Telehealth: Payer: Self-pay | Admitting: *Deleted

## 2022-07-24 NOTE — Telephone Encounter (Signed)
Attempted to call patient for their post-procedure follow-up call. No answer. Left voicemail.   

## 2022-07-31 ENCOUNTER — Encounter: Payer: Self-pay | Admitting: Gastroenterology

## 2022-08-12 ENCOUNTER — Encounter: Payer: Self-pay | Admitting: Hematology

## 2022-08-13 ENCOUNTER — Inpatient Hospital Stay: Payer: Medicare Other | Admitting: Hematology

## 2022-08-13 ENCOUNTER — Inpatient Hospital Stay: Payer: Medicare Other

## 2022-08-14 ENCOUNTER — Other Ambulatory Visit: Payer: Self-pay

## 2022-08-29 ENCOUNTER — Inpatient Hospital Stay (HOSPITAL_BASED_OUTPATIENT_CLINIC_OR_DEPARTMENT_OTHER): Payer: Medicare Other | Admitting: Physician Assistant

## 2022-08-29 ENCOUNTER — Inpatient Hospital Stay: Payer: Medicare Other | Attending: Oncology

## 2022-08-29 ENCOUNTER — Inpatient Hospital Stay: Payer: Medicare Other

## 2022-08-29 ENCOUNTER — Other Ambulatory Visit: Payer: Self-pay

## 2022-08-29 VITALS — BP 97/61 | HR 76 | Temp 98.0°F | Resp 17 | Ht 72.0 in | Wt 202.5 lb

## 2022-08-29 DIAGNOSIS — C9000 Multiple myeloma not having achieved remission: Secondary | ICD-10-CM

## 2022-08-29 DIAGNOSIS — Z5112 Encounter for antineoplastic immunotherapy: Secondary | ICD-10-CM | POA: Insufficient documentation

## 2022-08-29 DIAGNOSIS — C9002 Multiple myeloma in relapse: Secondary | ICD-10-CM | POA: Diagnosis not present

## 2022-08-29 LAB — CMP (CANCER CENTER ONLY)
ALT: 8 U/L (ref 0–44)
AST: 11 U/L — ABNORMAL LOW (ref 15–41)
Albumin: 4.3 g/dL (ref 3.5–5.0)
Alkaline Phosphatase: 29 U/L — ABNORMAL LOW (ref 38–126)
Anion gap: 7 (ref 5–15)
BUN: 13 mg/dL (ref 8–23)
CO2: 29 mmol/L (ref 22–32)
Calcium: 9.8 mg/dL (ref 8.9–10.3)
Chloride: 106 mmol/L (ref 98–111)
Creatinine: 0.95 mg/dL (ref 0.61–1.24)
GFR, Estimated: >60 mL/min (ref >60)
Glucose, Bld: 104 mg/dL — ABNORMAL HIGH (ref 70–99)
Potassium: 3.7 mmol/L (ref 3.5–5.1)
Sodium: 142 mmol/L (ref 135–145)
Total Bilirubin: 0.4 mg/dL (ref 0.3–1.2)
Total Protein: 6.4 g/dL — ABNORMAL LOW (ref 6.5–8.1)

## 2022-08-29 LAB — CBC WITH DIFFERENTIAL (CANCER CENTER ONLY)
Abs Immature Granulocytes: 0.04 10*3/uL (ref 0.00–0.07)
Basophils Absolute: 0 10*3/uL (ref 0.0–0.1)
Basophils Relative: 1 %
Eosinophils Absolute: 0.4 10*3/uL (ref 0.0–0.5)
Eosinophils Relative: 5 %
HCT: 35.4 % — ABNORMAL LOW (ref 39.0–52.0)
Hemoglobin: 11.9 g/dL — ABNORMAL LOW (ref 13.0–17.0)
Immature Granulocytes: 1 %
Lymphocytes Relative: 13 %
Lymphs Abs: 1 10*3/uL (ref 0.7–4.0)
MCH: 31.9 pg (ref 26.0–34.0)
MCHC: 33.6 g/dL (ref 30.0–36.0)
MCV: 94.9 fL (ref 80.0–100.0)
Monocytes Absolute: 1.2 10*3/uL — ABNORMAL HIGH (ref 0.1–1.0)
Monocytes Relative: 15 %
Neutro Abs: 5.2 10*3/uL (ref 1.7–7.7)
Neutrophils Relative %: 65 %
Platelet Count: 300 10*3/uL (ref 150–400)
RBC: 3.73 MIL/uL — ABNORMAL LOW (ref 4.22–5.81)
RDW: 14.1 % (ref 11.5–15.5)
WBC Count: 7.9 10*3/uL (ref 4.0–10.5)
nRBC: 0 % (ref 0.0–0.2)

## 2022-08-29 MED ORDER — DARATUMUMAB-HYALURONIDASE-FIHJ 1800-30000 MG-UT/15ML ~~LOC~~ SOLN
1800.0000 mg | Freq: Once | SUBCUTANEOUS | Status: AC
  Administered 2022-08-29: 1800 mg via SUBCUTANEOUS
  Filled 2022-08-29: qty 15

## 2022-08-29 MED ORDER — ACETAMINOPHEN 325 MG PO TABS
650.0000 mg | ORAL_TABLET | Freq: Once | ORAL | Status: AC
  Administered 2022-08-29: 650 mg via ORAL
  Filled 2022-08-29: qty 2

## 2022-08-29 MED ORDER — DIPHENHYDRAMINE HCL 25 MG PO CAPS
50.0000 mg | ORAL_CAPSULE | Freq: Once | ORAL | Status: AC
  Administered 2022-08-29: 50 mg via ORAL
  Filled 2022-08-29: qty 2

## 2022-08-29 MED ORDER — DEXAMETHASONE 4 MG PO TABS
12.0000 mg | ORAL_TABLET | Freq: Once | ORAL | Status: AC
  Administered 2022-08-29: 12 mg via ORAL
  Filled 2022-08-29: qty 3

## 2022-08-29 NOTE — Patient Instructions (Signed)

## 2022-08-29 NOTE — Progress Notes (Signed)
HEMATOLOGY/ONCOLOGY CLINIC NOTE  Date of Service: 08/29/22  Patient Care Team: Jackie Plum, MD as PCP - General (Internal Medicine) Runell Gess, MD as PCP - Cardiology (Cardiology)  CHIEF COMPLAINTS/PURPOSE OF CONSULTATION:  Alexander Saunders Chain Multiple Myeloma  PRIOR THERAPY: He is status post surgical decompression of an epidural mass on 09/04/2016 and the pathology showed a plasma cell neoplasm. He is S/P adjuvant radiation therapy to the thoracic spine to be completed on 10/14/2016. Velcade 1.5 mg/m weekly with dexamethasone 20 mg and Cytoxan 600 mg po. Therapy started on 10/24/2016.  High-dose chemotherapy followed by stem cell transplant: Melphalan 200 mg/m2 completed on 06/17/17 followed by autologous stem cell rescue 06/18/17.  He achieved complete response Zometa 4 mg every 3 months started in February 2020.  Last treatment given in January 2022. Revlimid 10 mg daily for 21 days out of a 28-day started in February 2020.  Therapy discontinued in November 2022 because of worsening anemia. Carfilzomib 20 mg/m weekly started on February 19, 2021.  Therapy stopped in March 2023 for presumed cardiomyopathy complication. Daratumumab 1800 mg subcutaneous injection weekly started on February 19, 2021 with Dexamethasone weekly.    Current treatment: Daratumumab every 28 days    INTERVAL HISTORY: Alexander Saunders is a 72 y.o. male who is here for evaluation and management of multiple myeloma. He was last seen by Dr. Candise Che on 07/15/2022. He presents unaccompanied for this visit today.   Alexander Saunders reports his energy and appetite are stable. He is trying to stay active and is able to complete all his ADLs on his own. He denies any appetite changes or weight loss. He denies nausea, vomiting or bowel habit changes. He denies any new bone or back pain. He denies easy bruising or signs of active bleeding. He denies fevers, chills, sweats, shortness of breath, chest pain or cough. He  has no other complaints.    MEDICAL HISTORY:  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   Stem cells transplant status (HCC) 06/2017    SURGICAL HISTORY: Past Surgical History:  Procedure Laterality Date   APPENDECTOMY     BONE MARROW BIOPSY  07/2018   COLONOSCOPY     KNEE ARTHROSCOPY     r/knee   LAMINECTOMY N/A 09/04/2016   Procedure: THORACIC FOUR-SIX LAMINECTOMY FOR RESECTION OF TUMOR;  Surgeon: Ditty, Loura Halt, MD;  Location: [facility] OR;  Service: Neurosurgery;  Laterality: N/A;    SOCIAL HISTORY: Social History   Socioeconomic History   Marital status: Married    Spouse name: Not on file   Number of children: Not on file   Years of education: Not on file   Highest education level: Not on file  Occupational History   Not on file  Tobacco Use   Smoking status: Never   Smokeless tobacco: Never  Vaping Use   Vaping status: Never Used  Substance and Sexual Activity   Alcohol use: Yes    Comment: occ   Drug use: Never   Sexual activity: Not on file  Other Topics  Concern   Not on file  Social History Narrative   He worked as a Sports coach level of education:  college   He is back in college at [employer] studying exercise physiology.       Right handed      Single story home   Social Determinants of Health   Financial Resource Strain: Not on file  Food Insecurity: No Food Insecurity (09/11/2021)   Hunger Vital Sign    Worried About Running Out of Food in the Last Year: Never true    Ran Out of Food in the Last Year: Never true  Transportation Needs: No Transportation  Needs (09/11/2021)   PRAPARE - Administrator, Civil Service (Medical): No    Lack of Transportation (Non-Medical): No  Physical Activity: Not on file  Stress: Not on file  Social Connections: Not on file  Intimate Partner Violence: Not on file    FAMILY HISTORY: Family History  Problem Relation Age of Onset   Hypertension Mother    Cancer Mother    Lung cancer Mother    Hypertension Father    Colon cancer Neg Hx    Colon polyps Neg Hx    Esophageal cancer Neg Hx    Rectal cancer Neg Hx    Stomach cancer Neg Hx     ALLERGIES:  is allergic to jardiance [empagliflozin].  MEDICATIONS:  Current Outpatient Medications  Medication Sig Dispense Refill   acyclovir (ZOVIRAX) 400 MG tablet Take 1 tablet (400 mg total) by mouth daily. 90 tablet 3   aspirin EC 81 MG tablet Take 81 mg by mouth daily.     bisacodyl (DULCOLAX) 5 MG EC tablet Take 5 mg by mouth daily as needed for moderate constipation.     carboxymethylcellulose (REFRESH PLUS) 0.5 % SOLN Apply 2 drops to eye daily as needed (as needed).     carvedilol (COREG) 12.5 MG tablet Take 1 tablet (12.5 mg total) by mouth 2 (two) times daily. 180 tablet 3   cholecalciferol (VITAMIN D3) 25 MCG (1000 UNIT) tablet Take 1,000 Units by mouth daily.     diphenhydramine-acetaminophen (TYLENOL PM EXTRA STRENGTH) 25-500 MG TABS tablet Take 1 tablet by mouth at bedtime as needed (pain).     gabapentin (NEURONTIN) 300 MG capsule TAKE 3 CAPSULES BY MOUTH THREE TIMES DAILY IN THE MORNING AND IN THE AFTERNOON AND AT NIGHT 270 capsule 3   Multiple Vitamin (MULTIVITAMIN) capsule Take 1 capsule by mouth daily.     sacubitril-valsartan (ENTRESTO) 24-26 MG Take 1 tablet by mouth 2 (two) times daily. 60 tablet 3   sildenafil (VIAGRA) 50 MG tablet daily as needed.     sodium chloride (OCEAN) 0.65 % SOLN nasal spray Place 1 spray into both nostrils daily as needed for congestion.     spironolactone (ALDACTONE) 25 MG tablet Take 1 tablet (25 mg  total) by mouth daily. 90 tablet 3   B Complex Vitamins (B-COMPLEX/B-12) TABS Take 1 tablet by mouth daily. (Patient not taking: Reported on 07/23/2022)     Current Facility-Administered Medications  Medication Dose Route Frequency Provider Last Rate Last Admin   sacubitril-valsartan (ENTRESTO) 24-26 mg per tablet  1 tablet Oral BID Jodelle Gross, NP        REVIEW OF SYSTEMS:    10 Point review of Systems was done is negative except as noted above.   PHYSICAL EXAMINATION: ECOG PERFORMANCE STATUS: 1 - Symptomatic but completely ambulatory  .BP 97/61 (BP  Location: Left Arm, Patient Position: Sitting)   Pulse 76   Temp 98 F (36.7 C) (Oral)   Resp 17   Ht 6' (1.829 m)   Wt 202 lb 8 oz (91.9 kg)   SpO2 98%   BMI 27.46 kg/m  GENERAL:alert, in no acute distress and comfortable SKIN: no acute rashes, no significant lesions EYES: conjunctiva are pink and non-injected, sclera anicteric LUNGS: clear to auscultation b/l with normal respiratory effort HEART: regular rate & rhythm Extremity: no pedal edema PSYCH: alert & oriented x 3 with fluent speech NEURO: no focal motor/sensory deficits   LABORATORY DATA:  I have reviewed the data as listed .    Latest Ref Rng & Units 08/29/2022    8:23 AM 07/15/2022   10:02 AM 06/18/2022    7:38 AM  CBC  WBC 4.0 - 10.5 K/uL 7.9  6.2  6.9   Hemoglobin 13.0 - 17.0 g/dL 16.1  09.6  04.5   Hematocrit 39.0 - 52.0 % 35.4  35.7  37.9   Platelets 150 - 400 K/uL 300  209  247    .    Latest Ref Rng & Units 07/15/2022   10:02 AM 06/18/2022    7:38 AM 05/21/2022    8:25 AM  CMP  Glucose 70 - 99 mg/dL 409  811  914   BUN 8 - 23 mg/dL 15  16  16    Creatinine 0.61 - 1.24 mg/dL 7.82  9.56  2.13   Sodium 135 - 145 mmol/L 142  142  139   Potassium 3.5 - 5.1 mmol/L 3.7  4.1  3.9   Chloride 98 - 111 mmol/L 109  106  108   CO2 22 - 32 mmol/L 26  30  26    Calcium 8.9 - 10.3 mg/dL 9.3  9.5  9.3   Total Protein 6.5 - 8.1 g/dL 6.0  6.5  6.4   Total  Bilirubin 0.3 - 1.2 mg/dL 0.4  0.5  0.4   Alkaline Phos 38 - 126 U/L 26  26  24    AST 15 - 41 U/L 11  12  13    ALT 0 - 44 U/L 8  10  9      Surgical Pathology  THIS IS AN ADDENDUM REPORT  CASE: [id number] PATIENT: Arsenio Arline Bone Marrow Report Addendum   Reason for Addendum #1:  Additional special stains Reason for Addendum #2:  Cytogenetics results  Clinical History: multiple myeloma   DIAGNOSIS:  BONE MARROW, ASPIRATE, CLOT, CORE: -  Hypercellular marrow involved by plasma cell myeloma -  See comment  PERIPHERAL BLOOD: -  Macrocytic anemia -  See complete blood cell count  COMMENT:  The findings are consistent with recurrence of the patient's previously diagnosed plasma cell myeloma.  For completeness, immunohistochemistry ([id number], kappa and lambda by in situ hybridization) are pending and will be reported in an addendum.  MICROSCOPIC DESCRIPTION:  PERIPHERAL BLOOD SMEAR: The peripheral blood has a macrocytic anemia. There is no significant rouleaux formation identified.  Plasmacytoid lymphocytes are seen.  Platelets are within normal limits.  BONE MARROW ASPIRATE: Spicular, cellular and adequate for evaluation Erythroid precursors: Diminished numbers, no overt dysplasia Granulocytic precursors: Diminished numbers, no overt dysplasia Megakaryocytes: Present Lymphocytes/plasma cells: There is a marked increase of atypical plasma cells.  The plasma cells are enlarged and multinucleated.  There is no lymphocytosis.  TOUCH PREPARATIONS: No additional findings as compared to the aspirate smears  CLOT AND BIOPSY: Examination of the bone marrow core  biopsy and clot section reveals a hypercellular marrow for age (> 95%) involved by plasma cell neoplasm.  Background hematopoiesis is markedly diminished. Megakaryocytes are present.  There are no granulomas identified.  IRON STAIN: Iron stains are performed on a bone marrow aspirate or touch imprint smear and  section of clot. The controls stained appropriately.       Storage Iron: Present (aspirate smears)      Ring Sideroblasts: Not identified    RADIOGRAPHIC STUDIES: No images reviewed during today's visit.     ASSESSMENT & PLAN:   Kappa Light Chain Multiple Myeloma: -First diagnosed in 2018 an relapsed in 2022.  -See treatment history above -Currently on daratumumab q 28 days started on 02/19/2021.  PLAN: -Due for Cycle 21, Day 1 of monthly daratumumab/dex today -Labs from today were reviewed and adequate for treatment. WBC 7.9, Hgb 11.9, Plt 300. Creatinine and Calcium levels normal. Myeloma labs pending.  -Most recent myeloma labs from 05/21/2022 showed no M protein detected. Kappa and Lambda free light chains normal.  -Proceed with treatment today without any dose modifications.   FOLLOW-UP: RTC in 4 weeks with labs, follow up visit before Cycle 22 of Daratumumab/Dex.    All of the patient's questions were answered with apparent satisfaction. The patient knows to call the clinic with any problems, questions or concerns.  I have spent a total of 30 minutes minutes of face-to-face and non-face-to-face time, preparing to see the patient, performing a medically appropriate examination, counseling and educating the patient, o documenting clinical information in the electronic health record, independently interpreting results and communicating results to the patient, and care coordination.   Georga Kaufmann PA-C Dept of Hematology and Oncology [facility] at [facility] Phone: [phone]

## 2022-09-02 ENCOUNTER — Encounter: Payer: Self-pay | Admitting: Hematology

## 2022-09-02 ENCOUNTER — Other Ambulatory Visit: Payer: Self-pay

## 2022-09-02 DIAGNOSIS — J208 Acute bronchitis due to other specified organisms: Secondary | ICD-10-CM | POA: Diagnosis not present

## 2022-09-10 ENCOUNTER — Ambulatory Visit: Payer: TRICARE For Life (TFL) | Admitting: Hematology

## 2022-09-10 ENCOUNTER — Other Ambulatory Visit: Payer: TRICARE For Life (TFL)

## 2022-09-10 ENCOUNTER — Ambulatory Visit: Payer: TRICARE For Life (TFL)

## 2022-09-11 ENCOUNTER — Other Ambulatory Visit: Payer: TRICARE For Life (TFL)

## 2022-09-11 ENCOUNTER — Ambulatory Visit: Payer: TRICARE For Life (TFL) | Admitting: Hematology

## 2022-09-11 ENCOUNTER — Ambulatory Visit: Payer: TRICARE For Life (TFL)

## 2022-09-12 ENCOUNTER — Other Ambulatory Visit: Payer: Self-pay

## 2022-09-15 DIAGNOSIS — M25552 Pain in left hip: Secondary | ICD-10-CM | POA: Diagnosis not present

## 2022-09-15 DIAGNOSIS — C9000 Multiple myeloma not having achieved remission: Secondary | ICD-10-CM | POA: Diagnosis not present

## 2022-09-16 ENCOUNTER — Ambulatory Visit (HOSPITAL_COMMUNITY): Payer: Medicare Other | Attending: Cardiology

## 2022-09-16 DIAGNOSIS — I5020 Unspecified systolic (congestive) heart failure: Secondary | ICD-10-CM | POA: Diagnosis not present

## 2022-09-16 LAB — ECHOCARDIOGRAM COMPLETE
Area-P 1/2: 5.27 cm2
S' Lateral: 3.3 cm

## 2022-09-17 ENCOUNTER — Telehealth: Payer: Self-pay

## 2022-09-17 NOTE — Telephone Encounter (Addendum)
Results viewed by patient via MyChart.----- Message from Joni Reining sent at 09/16/2022  3:01 PM EDT ----- Heart pumping function has improved on medication regimen to normal.  Continue his current regimen as directed.   KL

## 2022-09-24 ENCOUNTER — Other Ambulatory Visit: Payer: Self-pay | Admitting: Cardiovascular Disease

## 2022-09-26 ENCOUNTER — Inpatient Hospital Stay: Payer: Medicare Other

## 2022-09-26 ENCOUNTER — Inpatient Hospital Stay: Payer: Medicare Other | Admitting: Hematology

## 2022-09-26 ENCOUNTER — Inpatient Hospital Stay: Payer: Medicare Other | Attending: Oncology

## 2022-09-26 DIAGNOSIS — D539 Nutritional anemia, unspecified: Secondary | ICD-10-CM | POA: Diagnosis not present

## 2022-09-26 DIAGNOSIS — C9000 Multiple myeloma not having achieved remission: Secondary | ICD-10-CM | POA: Insufficient documentation

## 2022-09-26 LAB — CBC WITH DIFFERENTIAL (CANCER CENTER ONLY)
Abs Immature Granulocytes: 0.06 10*3/uL (ref 0.00–0.07)
Basophils Absolute: 0 10*3/uL (ref 0.0–0.1)
Basophils Relative: 0 %
Eosinophils Absolute: 0.3 10*3/uL (ref 0.0–0.5)
Eosinophils Relative: 4 %
HCT: 28.5 % — ABNORMAL LOW (ref 39.0–52.0)
Hemoglobin: 10 g/dL — ABNORMAL LOW (ref 13.0–17.0)
Immature Granulocytes: 1 %
Lymphocytes Relative: 12 %
Lymphs Abs: 0.9 10*3/uL (ref 0.7–4.0)
MCH: 33.2 pg (ref 26.0–34.0)
MCHC: 35.1 g/dL (ref 30.0–36.0)
MCV: 94.7 fL (ref 80.0–100.0)
Monocytes Absolute: 1.1 10*3/uL — ABNORMAL HIGH (ref 0.1–1.0)
Monocytes Relative: 14 %
Neutro Abs: 5.1 10*3/uL (ref 1.7–7.7)
Neutrophils Relative %: 69 %
Platelet Count: 288 10*3/uL (ref 150–400)
RBC: 3.01 MIL/uL — ABNORMAL LOW (ref 4.22–5.81)
RDW: 14.2 % (ref 11.5–15.5)
WBC Count: 7.4 10*3/uL (ref 4.0–10.5)
nRBC: 0 % (ref 0.0–0.2)

## 2022-09-26 LAB — CMP (CANCER CENTER ONLY)
ALT: 9 U/L (ref 0–44)
AST: 12 U/L — ABNORMAL LOW (ref 15–41)
Albumin: 4.2 g/dL (ref 3.5–5.0)
Alkaline Phosphatase: 25 U/L — ABNORMAL LOW (ref 38–126)
Anion gap: 7 (ref 5–15)
BUN: 16 mg/dL (ref 8–23)
CO2: 27 mmol/L (ref 22–32)
Calcium: 9.8 mg/dL (ref 8.9–10.3)
Chloride: 106 mmol/L (ref 98–111)
Creatinine: 1.01 mg/dL (ref 0.61–1.24)
GFR, Estimated: >60 mL/min (ref >60)
Glucose, Bld: 114 mg/dL — ABNORMAL HIGH (ref 70–99)
Potassium: 4.2 mmol/L (ref 3.5–5.1)
Sodium: 140 mmol/L (ref 135–145)
Total Bilirubin: 0.4 mg/dL (ref 0.3–1.2)
Total Protein: 6.5 g/dL (ref 6.5–8.1)

## 2022-09-26 NOTE — Progress Notes (Signed)
HEMATOLOGY/ONCOLOGY CLINIC NOTE  Date of Service: 09/26/22  Patient Care Team: Jackie Plum, MD as PCP - General (Internal Medicine) Runell Gess, MD as PCP - Cardiology (Cardiology)  CHIEF COMPLAINTS/PURPOSE OF CONSULTATION:  Evaluation and management of Kappa Light Chain Multiple Myeloma  HISTORY OF PRESENTING ILLNESS:   Alexander Saunders is a wonderful 72 y.o. male who is here for evaluation and management of multiple myeloma. Patient was following with Dr. Clelia Croft and has been transferred to [place]. Patient was diagnosed with multiple myeloma in 2018 which relapsed in 2022 with prior treatment as noted below.  Prior Therapy:   He is status post surgical decompression of an epidural mass on 09/04/2016 and the pathology showed a plasma cell neoplasm.   He is S/P adjuvant radiation therapy to the thoracic spine to be completed on 10/14/2016.   Velcade 1.5 mg/m weekly with dexamethasone 20 mg and Cytoxan 600 mg po. Therapy started on 10/24/2016.    High-dose chemotherapy followed by stem cell transplant: Melphalan 200 mg/m2 completed on 06/17/17 followed by autologous stem cell rescue 06/18/17.  He achieved complete response.     Zometa 4 mg every 3 months started in February 2020.  Last treatment given in January 2022.     Revlimid 10 mg daily for 21 days out of a 28-day started in February 2020.  Therapy discontinued in November 2022 because of worsening anemia.   Carfilzomib 20 mg/m weekly started on February 19, 2021.  Therapy stopped in March 2023 for presumed cardiomyopathy complication.     Daratumumab 1800 mg subcutaneous injection weekly started on February 19, 2021 with Dexamethasone weekly.     Current treatment: Patient is currently on monthly Daratumumab 1800 mg subcutaneous injections per protocol currently every 28 days with Dexamethasone weekly 20 mg.  Patient notes he is doing well overall without any new medical concern since his last visit with Dr.  Clelia Croft. He complains of weight gain from Dexamethasone, which he is currently trying to loss. He denies any toxicities with Daratumumab.   Patient notes that he tested positive for COVID-19 around 2-3 months and notes that his symptoms have improved since.  He denies fever, chills, night sweats, abdominal pain, back pain, chest pain, or leg swelling. However, he does complain of intermittent hip pain.  He has received his influenza vaccine, but denies COVID-19 Booster ad RSV vaccine.    INTERVAL HISTORY:  Alexander Saunders is a wonderful 72 y.o. male who is here for evaluation and management of multiple myeloma.   Patient was last seen by me on 07/15/2022 and complained of left groin pain and back tenderness, but was otherwise doing well overall with no new medical concerns.   He was seen by Mariane Baumgarten, PA on 08/29/2022 and was doing well overall with no complaints.   Patient was most recently seen by Dr. Anne Hahn on 09/15/2022 and was doing well overall.   Today, he reports that he had acute bacterial bronchitis 3 weeks ago and was taking Augmentin which he finished two weeks ago. His infection initially presented with frequent coughing. Since his infection, he has endorsed intermittent chest discomfort/soreness. He denies coughing up any phlegm. He has tested negative for influenza and COVID-19. Patient denies any recent SOB, fever, chills, rhinorrhea, or night sweats. He denies having any frequent infections in general.   He notes that bronchitis did limit his appetite though his p.o. intake has since improved. Patient does take CoQ10 and a multivitamin regularly.   Patient has  no major limitations while exercising. He denies any wheezing and has not required an inhaler. He denies any bleeding concerns or abdominal pain.   He complains of mild left hip pain related to osteoarthritis, and denies any other localized bone pain. His hip pain resolves with walking.   Patient does endorse occasional  fatigue.   He reports that he most likely received the RSV vaccine last year.  MEDICAL HISTORY:  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   Stem cells transplant status (HCC) 06/2017    SURGICAL HISTORY: Past Surgical History:  Procedure Laterality Date   APPENDECTOMY     BONE MARROW BIOPSY  07/2018   COLONOSCOPY     KNEE ARTHROSCOPY     r/knee   LAMINECTOMY N/A 09/04/2016   Procedure: THORACIC FOUR-SIX LAMINECTOMY FOR RESECTION OF TUMOR;  Surgeon: Ditty, Loura Halt, MD;  Location: [facility] OR;  Service: Neurosurgery;  Laterality: N/A;    SOCIAL HISTORY: Social History   Socioeconomic History   Marital status: Married    Spouse name: Not on file   Number of children: Not on file   Years of education: Not on file   Highest education level: Not on file  Occupational History   Not on file  Tobacco Use   Smoking status: Never   Smokeless tobacco: Never  Vaping Use   Vaping status: Never Used  Substance and Sexual Activity   Alcohol use: Yes    Comment: occ   Drug use: Never   Sexual activity: Not on file  Other Topics Concern   Not on file  Social History Narrative   He worked as a Sports coach level of education:  college   He is back in college at [employer] studying exercise physiology.       Right handed      Single story home   Social Determinants of Health   Financial Resource Strain: Not on file  Food Insecurity: No Food Insecurity (09/11/2021)   Hunger Vital Sign    Worried About Running Out of Food in the Last Year: Never true    Ran Out of Food in the Last Year:  Never true  Transportation Needs: No Transportation Needs (09/11/2021)   PRAPARE - Administrator, Civil Service (Medical): No    Lack of Transportation (Non-Medical): No  Physical Activity: Not on file  Stress: Not on file  Social Connections: Not on file  Intimate Partner Violence: Not on file    FAMILY HISTORY: Family History  Problem Relation Age of Onset   Hypertension Mother    Cancer Mother    Lung cancer Mother    Hypertension Father    Colon cancer Neg Hx    Colon polyps Neg Hx    Esophageal cancer Neg Hx    Rectal cancer Neg Hx    Stomach cancer Neg Hx     ALLERGIES:  is allergic to jardiance [empagliflozin].  MEDICATIONS:  Current Outpatient Medications  Medication Sig Dispense Refill   acyclovir (ZOVIRAX) 400 MG tablet  Take 1 tablet (400 mg total) by mouth daily. 90 tablet 3   aspirin EC 81 MG tablet Take 81 mg by mouth daily.     B Complex Vitamins (B-COMPLEX/B-12) TABS Take 1 tablet by mouth daily. (Patient not taking: Reported on 07/23/2022)     bisacodyl (DULCOLAX) 5 MG EC tablet Take 5 mg by mouth daily as needed for moderate constipation.     carboxymethylcellulose (REFRESH PLUS) 0.5 % SOLN Apply 2 drops to eye daily as needed (as needed).     carvedilol (COREG) 12.5 MG tablet TAKE 1 TABLET(12.5 MG) BY MOUTH TWICE DAILY 180 tablet 3   cholecalciferol (VITAMIN D3) 25 MCG (1000 UNIT) tablet Take 1,000 Units by mouth daily.     diphenhydramine-acetaminophen (TYLENOL PM EXTRA STRENGTH) 25-500 MG TABS tablet Take 1 tablet by mouth at bedtime as needed (pain).     gabapentin (NEURONTIN) 300 MG capsule TAKE 3 CAPSULES BY MOUTH THREE TIMES DAILY IN THE MORNING AND IN THE AFTERNOON AND AT NIGHT 270 capsule 3   Multiple Vitamin (MULTIVITAMIN) capsule Take 1 capsule by mouth daily.     sacubitril-valsartan (ENTRESTO) 24-26 MG Take 1 tablet by mouth 2 (two) times daily. 60 tablet 3   sildenafil (VIAGRA) 50 MG tablet daily as needed.     sodium chloride (OCEAN)  0.65 % SOLN nasal spray Place 1 spray into both nostrils daily as needed for congestion.     spironolactone (ALDACTONE) 25 MG tablet Take 1 tablet (25 mg total) by mouth daily. 90 tablet 3   Current Facility-Administered Medications  Medication Dose Route Frequency Provider Last Rate Last Admin   sacubitril-valsartan (ENTRESTO) 24-26 mg per tablet  1 tablet Oral BID Jodelle Gross, NP        REVIEW OF SYSTEMS:    10 Point review of Systems was done is negative except as noted above.   PHYSICAL EXAMINATION: ECOG PERFORMANCE STATUS: 1 - Symptomatic but completely ambulatory .BP (!) 106/52 (BP Location: Left Arm, Patient Position: Sitting) Comment: nurse is aware  Pulse 82   Temp 98 F (36.7 C) (Oral)   Resp 17   Ht 6' (1.829 m)   Wt 200 lb 9.6 oz (91 kg)   SpO2 100%   BMI 27.21 kg/m    GENERAL:alert, in no acute distress and comfortable SKIN: no acute rashes, no significant lesions EYES: conjunctiva are pink and non-injected, sclera anicteric OROPHARYNX: MMM, no exudates, no oropharyngeal erythema or ulceration NECK: supple, no JVD LYMPH:  no palpable lymphadenopathy in the cervical, axillary or inguinal regions LUNGS: clear to auscultation b/l with normal respiratory effort HEART: regular rate & rhythm ABDOMEN:  normoactive bowel sounds , non tender, not distended. Extremity: no pedal edema PSYCH: alert & oriented x 3 with fluent speech NEURO: no focal motor/sensory deficits   LABORATORY DATA:  I have reviewed the data as listed .    Latest Ref Rng & Units 09/26/2022   11:11 AM 08/29/2022    8:23 AM 07/15/2022   10:02 AM  CBC  WBC 4.0 - 10.5 K/uL 7.4  7.9  6.2   Hemoglobin 13.0 - 17.0 g/dL 21.3  08.6  57.8   Hematocrit 39.0 - 52.0 % 28.5  35.4  35.7   Platelets 150 - 400 K/uL 288  300  209    .    Latest Ref Rng & Units 09/26/2022   11:11 AM 08/29/2022    8:23 AM 07/15/2022   10:02 AM  CMP  Glucose 70 - 99  mg/dL 130  865  784   BUN 8 - 23 mg/dL 16  13  15     Creatinine 0.61 - 1.24 mg/dL 6.96  2.95  2.84   Sodium 135 - 145 mmol/L 140  142  142   Potassium 3.5 - 5.1 mmol/L 4.2  3.7  3.7   Chloride 98 - 111 mmol/L 106  106  109   CO2 22 - 32 mmol/L 27  29  26    Calcium 8.9 - 10.3 mg/dL 9.8  9.8  9.3   Total Protein 6.5 - 8.1 g/dL 6.5  6.4  6.0   Total Bilirubin 0.3 - 1.2 mg/dL 0.4  0.4  0.4   Alkaline Phos 38 - 126 U/L 25  29  26    AST 15 - 41 U/L 12  11  11    ALT 0 - 44 U/L 9  8  8      Surgical Pathology  THIS IS AN ADDENDUM REPORT  CASE: [id number] PATIENT: Mareo Catena Bone Marrow Report Addendum   Reason for Addendum #1:  Additional special stains Reason for Addendum #2:  Cytogenetics results  Clinical History: multiple myeloma   DIAGNOSIS:  BONE MARROW, ASPIRATE, CLOT, CORE: -  Hypercellular marrow involved by plasma cell myeloma -  See comment  PERIPHERAL BLOOD: -  Macrocytic anemia -  See complete blood cell count  COMMENT:  The findings are consistent with recurrence of the patient's previously diagnosed plasma cell myeloma.  For completeness, immunohistochemistry (CD138, kappa and lambda by in situ hybridization) are pending and will be reported in an addendum.  MICROSCOPIC DESCRIPTION:  PERIPHERAL BLOOD SMEAR: The peripheral blood has a macrocytic anemia. There is no significant rouleaux formation identified.  Plasmacytoid lymphocytes are seen.  Platelets are within normal limits.  BONE MARROW ASPIRATE: Spicular, cellular and adequate for evaluation Erythroid precursors: Diminished numbers, no overt dysplasia Granulocytic precursors: Diminished numbers, no overt dysplasia Megakaryocytes: Present Lymphocytes/plasma cells: There is a marked increase of atypical plasma cells.  The plasma cells are enlarged and multinucleated.  There is no lymphocytosis.  TOUCH PREPARATIONS: No additional findings as compared to the aspirate smears  CLOT AND BIOPSY: Examination of the bone marrow core biopsy and  clot section reveals a hypercellular marrow for age (> 95%) involved by plasma cell neoplasm.  Background hematopoiesis is markedly diminished. Megakaryocytes are present.  There are no granulomas identified.  IRON STAIN: Iron stains are performed on a bone marrow aspirate or touch imprint smear and section of clot. The controls stained appropriately.       Storage Iron: Present (aspirate smears)      Ring Sideroblasts: Not identified    RADIOGRAPHIC STUDIES: I have personally reviewed the radiological images as listed and agreed with the findings in the report. ECHOCARDIOGRAM COMPLETE  Result Date: 09/16/2022    ECHOCARDIOGRAM REPORT   Patient Name:   ZACARRI VAILLANT Date of Exam: 09/16/2022 Medical Rec #:  [id number]       Height:       72.0 in Accession #:    [id number]      Weight:       202.5 lb Date of Birth:  May 09, 1950       BSA:          2.142 m Patient Age:    72 years        BP:           130/66 mmHg Patient Gender: M  HR:           84 bpm. Exam Location:  [address] Procedure: 2D Echo, 3D Echo, Cardiac Doppler, Color Doppler and Strain Analysis Indications:    I50.1 CHF  History:        Patient has prior history of Echocardiogram examinations, most                 recent 06/04/2022. Abnormal ECG, Multiple Myeloma,                 Signs/Symptoms:Palpitations; Risk Factors:Hypertension and HLD.                 Hx of chemotherapy.  Sonographer:    Clearence Ped RCS Referring Phys: [id number] Bettey Mare LAWRENCE IMPRESSIONS  1. Left ventricular ejection fraction, by estimation, is 50 to 55%. Left ventricular ejection fraction by 3D volume is 52 %. The left ventricle has low normal function. The left ventricle has no regional wall motion abnormalities. Left ventricular diastolic parameters are consistent with Grade I diastolic dysfunction (impaired relaxation).  2. Right ventricular systolic function is normal. The right ventricular size is normal.  3. The mitral valve is normal in  structure. Mild mitral valve regurgitation. No evidence of mitral stenosis.  4. The aortic valve is tricuspid. Aortic valve regurgitation is not visualized. No aortic stenosis is present.  5. Aortic dilatation noted. There is mild dilatation of the aortic root, measuring 40 mm.  6. The inferior vena cava is normal in size with greater than 50% respiratory variability, suggesting right atrial pressure of 3 mmHg. Comparison(s): Prior images reviewed side by side. The left ventricular function has improved. FINDINGS  Left Ventricle: Left ventricular ejection fraction, by estimation, is 50 to 55%. Left ventricular ejection fraction by 3D volume is 52 %. The left ventricle has low normal function. The left ventricle has no regional wall motion abnormalities. The left ventricular internal cavity size was normal in size. There is no left ventricular hypertrophy. Left ventricular diastolic parameters are consistent with Grade I diastolic dysfunction (impaired relaxation). Right Ventricle: The right ventricular size is normal. No increase in right ventricular wall thickness. Right ventricular systolic function is normal. Left [facility]: Left atrial size was normal in size. Right [facility]: Right atrial size was normal in size. Pericardium: There is no evidence of pericardial effusion. Mitral Valve: The mitral valve is normal in structure. Mild mitral valve regurgitation. No evidence of mitral valve stenosis. Tricuspid Valve: The tricuspid valve is normal in structure. Tricuspid valve regurgitation is not demonstrated. No evidence of tricuspid stenosis. Aortic Valve: The aortic valve is tricuspid. Aortic valve regurgitation is not visualized. No aortic stenosis is present. Pulmonic Valve: The pulmonic valve was normal in structure. Pulmonic valve regurgitation is not visualized. No evidence of pulmonic stenosis. Aorta: Aortic dilatation noted. There is mild dilatation of the aortic root, measuring 40 mm. Venous: The inferior vena  cava is normal in size with greater than 50% respiratory variability, suggesting right atrial pressure of 3 mmHg. IAS/Shunts: No atrial level shunt detected by color flow Doppler.  LEFT VENTRICLE PLAX 2D LVIDd:         4.70 cm         Diastology LVIDs:         3.30 cm         LV e' medial:    9.14 cm/s LV PW:         1.10 cm         LV E/e' medial:  9.8 LV IVS:        1.10 cm         LV e' lateral:   11.60 cm/s LVOT diam:     2.30 cm         LV E/e' lateral: 7.7 LV SV:         91 LV SV Index:   42              2D LVOT Area:     4.15 cm        Longitudinal                                Strain                                2D Strain GLS  -21.6 %                                (A2C):                                2D Strain GLS  -21.4 %                                (A3C):                                2D Strain GLS  -20.2 %                                (A4C):                                2D Strain GLS  -21.0 %                                Avg:                                 3D Volume EF                                LV 3D EF:    Left                                             ventricul                                             ar  ejection                                             fraction                                             by 3D                                             volume is                                             52 %.                                 3D Volume EF:                                3D EF:        52 %                                LV EDV:       178 ml                                LV ESV:       85 ml                                LV SV:        93 ml RIGHT VENTRICLE RV Basal diam:  3.80 cm RV S prime:     15.20 cm/s TAPSE (M-mode): 2.8 cm LEFT ATRIUM             Index        RIGHT ATRIUM           Index LA diam:        3.60 cm 1.68 cm/m   RA Area:     13.30 cm LA Vol (A2C):   55.9 ml 26.10 ml/m  RA Volume:   31.40 ml   14.66 ml/m LA Vol (A4C):   36.4 ml 16.99 ml/m LA Biplane Vol: 46.5 ml 21.71 ml/m  AORTIC VALVE LVOT Vmax:   109.00 cm/s LVOT Vmean:  69.500 cm/s LVOT VTI:    0.218 m  AORTA Ao Root diam: 4.00 cm Ao Asc diam:  3.50 cm MITRAL VALVE MV Area (PHT):             SHUNTS MV Decel Time:             Systemic VTI:  0.22 m MV E velocity: 89.60 cm/s  Systemic Diam: 2.30 cm MV A velocity: 86.50 cm/s MV E/A ratio:  1.04 Donato Schultz MD Electronically signed by Donato Schultz  MD Signature Date/Time: 09/16/2022/10:54:50 AM    Final    Pet/ct 12/2020  IMPRESSION: 1. No evidence of FDG avid nodal or visceral metastatic disease in the chest, abdomen or pelvis. 2. Numerous osseous lytic lesions are similar in appearance when compared with 2018 prior. No focal FDG uptake above background marrow activity. Findings are compatible with history of multiple myeloma. 3. Coronary artery calcifications of the LAD and circumflex. 4. Mild aortic Atherosclerosis (ICD10-I70.0).     Electronically Signed   By: Allegra Lai M.D.   On: 12/28/2020 12:34   Pet/ct 03/16/2017 Impression   1.  Focal increased FDG uptake in the spinous process of T3 is consistent with site of multiple myeloma. 2.  No additional sites of increased metabolic activity to suggest cluster of myelomatous cells. 3.  Innumerable lytic lesions in the axial skeleton.    ASSESSMENT & PLAN:   72 y.o. male with:  Multiple Myeloma -First diagnosed in 2018 an relapsed in 2022.  Cytogenetics normal male karyotype Mol Cy - not done  PLAN:  -Discussed lab results on 09/26/2022 in detail with patient. CBC showed WBC of 7.4K, hemoglobin of 10.0, and platelets of 288K. -CMP stable, shows stable kidney numbers -discussed recent echocardiogram which showed that his EF improved to 52%, from previously being 35% about a year ago -no M spike found on recent labs form [facility] -kappa light chains were borderline elevated at 27. K/L ratio normal.   -hemoglobin was 12.7 three months ago and has decreased to its lowest at 10 currently. This may be related to an acute infection.  -will hold daratumumab today and reschedule to two weeks from now to ensure that infection resolves and reduce risk infection -discussed that there may be role for bone marrow biopsy or PET scan if there is any concern of disease progression, and potentially to consider next line of treatment -IgG level is low, though not significantly low. Discussed that if levels are less than 400 or if he has frequent infections, there may be a role for replacement with IV antibodies -patient has been taking 12 MG dexamethasone -discussed goal to continue to minimize steroid use -advised patient to stay UTD with age-appropriate vaccinations including influenza and RSV -discussed details of RSV vaccination -would generally recommend patient to receive COVID-19 booster vaccination. Discussed that this may cause a false positive reading on any PET scans, and would therefore recommend not scheduling PET scan within two months of receiving COVID-19 vaccination -answered all of patient's questions in detail  FOLLOW-UP: ***  The total time spent in the appointment was *** minutes* .  All of the patient's questions were answered with apparent satisfaction. The patient knows to call the clinic with any problems, questions or concerns.   Wyvonnia Lora MD MS AAHIVMS [facility] Hematology/Oncology Physician [facility]  .*Total Encounter Time as defined by the Centers for Medicare and Medicaid Services includes, in addition to the face-to-face time of a patient visit (documented in the note above) non-face-to-face time: obtaining and reviewing outside history, ordering and reviewing medications, tests or procedures, care coordination (communications with other health care professionals or caregivers) and documentation in the medical record.    I,Mitra Faeizi,acting as a Neurosurgeon  for Wyvonnia Lora, MD.,have documented all relevant documentation on the behalf of Wyvonnia Lora, MD,as directed by  Wyvonnia Lora, MD while in the presence of Wyvonnia Lora, MD.  ***

## 2022-09-29 LAB — KAPPA/LAMBDA LIGHT CHAINS
Kappa free light chain: 41.1 mg/L — ABNORMAL HIGH (ref 3.3–19.4)
Kappa, lambda light chain ratio: 11.74 — ABNORMAL HIGH (ref 0.26–1.65)
Lambda free light chains: 3.5 mg/L — ABNORMAL LOW (ref 5.7–26.3)

## 2022-10-02 ENCOUNTER — Encounter: Payer: Self-pay | Admitting: Hematology

## 2022-10-03 LAB — MULTIPLE MYELOMA PANEL, SERUM
Albumin SerPl Elph-Mcnc: 3.7 g/dL (ref 2.9–4.4)
Albumin/Glob SerPl: 1.7 (ref 0.7–1.7)
Alpha 1: 0.3 g/dL (ref 0.0–0.4)
Alpha2 Glob SerPl Elph-Mcnc: 0.9 g/dL (ref 0.4–1.0)
B-Globulin SerPl Elph-Mcnc: 0.8 g/dL (ref 0.7–1.3)
Gamma Glob SerPl Elph-Mcnc: 0.3 g/dL — ABNORMAL LOW (ref 0.4–1.8)
Globulin, Total: 2.3 g/dL (ref 2.2–3.9)
IgA: 19 mg/dL — ABNORMAL LOW (ref 61–437)
IgG (Immunoglobin G), Serum: 504 mg/dL — ABNORMAL LOW (ref 603–1613)
IgM (Immunoglobulin M), Srm: 9 mg/dL — ABNORMAL LOW (ref 15–143)
Total Protein ELP: 6 g/dL (ref 6.0–8.5)

## 2022-10-04 ENCOUNTER — Encounter: Payer: Self-pay | Admitting: Hematology

## 2022-10-07 ENCOUNTER — Other Ambulatory Visit: Payer: Self-pay

## 2022-10-07 ENCOUNTER — Emergency Department (HOSPITAL_COMMUNITY): Payer: Medicare Other

## 2022-10-07 ENCOUNTER — Telehealth: Payer: Self-pay

## 2022-10-07 ENCOUNTER — Encounter (HOSPITAL_COMMUNITY): Payer: Self-pay | Admitting: Emergency Medicine

## 2022-10-07 ENCOUNTER — Emergency Department (HOSPITAL_COMMUNITY)
Admission: EM | Admit: 2022-10-07 | Discharge: 2022-10-07 | Disposition: A | Discharge status: Home / Self Care | Payer: Medicare Other | Attending: Emergency Medicine | Admitting: Emergency Medicine

## 2022-10-07 DIAGNOSIS — Z7982 Long term (current) use of aspirin: Secondary | ICD-10-CM | POA: Insufficient documentation

## 2022-10-07 DIAGNOSIS — I509 Heart failure, unspecified: Secondary | ICD-10-CM | POA: Diagnosis not present

## 2022-10-07 DIAGNOSIS — C9000 Multiple myeloma not having achieved remission: Secondary | ICD-10-CM | POA: Diagnosis not present

## 2022-10-07 DIAGNOSIS — Z8579 Personal history of other malignant neoplasms of lymphoid, hematopoietic and related tissues: Secondary | ICD-10-CM | POA: Diagnosis not present

## 2022-10-07 DIAGNOSIS — R55 Syncope and collapse: Secondary | ICD-10-CM

## 2022-10-07 DIAGNOSIS — R0789 Other chest pain: Secondary | ICD-10-CM | POA: Diagnosis not present

## 2022-10-07 DIAGNOSIS — R079 Chest pain, unspecified: Secondary | ICD-10-CM | POA: Insufficient documentation

## 2022-10-07 LAB — BASIC METABOLIC PANEL
Anion gap: 10 (ref 5–15)
BUN: 25 mg/dL — ABNORMAL HIGH (ref 8–23)
CO2: 21 mmol/L — ABNORMAL LOW (ref 22–32)
Calcium: 9.4 mg/dL (ref 8.9–10.3)
Chloride: 103 mmol/L (ref 98–111)
Creatinine, Ser: 1.27 mg/dL — ABNORMAL HIGH (ref 0.61–1.24)
GFR, Estimated: 60 mL/min — ABNORMAL LOW (ref >60)
Glucose, Bld: 125 mg/dL — ABNORMAL HIGH (ref 70–99)
Potassium: 4.3 mmol/L (ref 3.5–5.1)
Sodium: 134 mmol/L — ABNORMAL LOW (ref 135–145)

## 2022-10-07 LAB — CBC WITH DIFFERENTIAL/PLATELET
Abs Immature Granulocytes: 0.13 10*3/uL — ABNORMAL HIGH (ref 0.00–0.07)
Basophils Absolute: 0 10*3/uL (ref 0.0–0.1)
Basophils Relative: 0 %
Eosinophils Absolute: 0.2 10*3/uL (ref 0.0–0.5)
Eosinophils Relative: 3 %
HCT: 27.2 % — ABNORMAL LOW (ref 39.0–52.0)
Hemoglobin: 8.9 g/dL — ABNORMAL LOW (ref 13.0–17.0)
Immature Granulocytes: 2 %
Lymphocytes Relative: 12 %
Lymphs Abs: 1 10*3/uL (ref 0.7–4.0)
MCH: 32.1 pg (ref 26.0–34.0)
MCHC: 32.7 g/dL (ref 30.0–36.0)
MCV: 98.2 fL (ref 80.0–100.0)
Monocytes Absolute: 0.9 10*3/uL (ref 0.1–1.0)
Monocytes Relative: 12 %
Neutro Abs: 5.5 10*3/uL (ref 1.7–7.7)
Neutrophils Relative %: 71 %
Platelets: 313 10*3/uL (ref 150–400)
RBC: 2.77 MIL/uL — ABNORMAL LOW (ref 4.22–5.81)
RDW: 14.2 % (ref 11.5–15.5)
WBC: 7.8 10*3/uL (ref 4.0–10.5)
nRBC: 0 % (ref 0.0–0.2)

## 2022-10-07 LAB — BRAIN NATRIURETIC PEPTIDE: B Natriuretic Peptide: 297.2 pg/mL — ABNORMAL HIGH (ref 0.0–100.0)

## 2022-10-07 LAB — TROPONIN I (HIGH SENSITIVITY)
Troponin I (High Sensitivity): 2 ng/L (ref <18)
Troponin I (High Sensitivity): 3 ng/L (ref <18)

## 2022-10-07 MED ORDER — SODIUM CHLORIDE 0.9 % IV BOLUS
1000.0000 mL | Freq: Once | INTRAVENOUS | Status: AC
  Administered 2022-10-07: 1000 mL via INTRAVENOUS

## 2022-10-07 MED ORDER — LACTATED RINGERS IV BOLUS
500.0000 mL | Freq: Once | INTRAVENOUS | Status: AC
  Administered 2022-10-07: 500 mL via INTRAVENOUS

## 2022-10-07 MED ORDER — IOHEXOL 350 MG/ML SOLN
75.0000 mL | Freq: Once | INTRAVENOUS | Status: AC | PRN
  Administered 2022-10-07: 75 mL via INTRAVENOUS

## 2022-10-07 NOTE — ED Triage Notes (Signed)
Pt arrived POV c/o chest pain, pt reports chest pain and fatigue started yesterday while he was walking outside. Pt reports he is currently receiving treatments for multiple myeloma and his Dr. instructed him to report to the ED.

## 2022-10-07 NOTE — ED Provider Notes (Signed)
Care of patient received from prior provider at 3:06 PM, please see their note for complete H/P and care plan.  Received handoff per ED course.  Clinical Course as of 10/07/22 1908  Tue Oct 07, 2022  1231 BP 108/58 mmhg [MT]  1503 Stable Alexander Saunders with a chief complaint of chest pain.  HX of MM Pain was yesterday and resolved. HX of lower than average BP but hypotensive today.  Multiple new BP meds (Coreg) added on to ENTRESTO and Aldactone BP measurement.  [CC]  1505 Signed out to Dr Len Childs pending follow up on CT PE, repeat BP assessment [MT]  1732 Reassessed at bedside.  Troponin had not been drawn.  Given patient's presenting factors we will perform second troponin. [CC]    Clinical Course User Index [CC] Glyn Ade, MD [MT] Renaye Rakers Kermit Balo, MD    Reassessment: On reassessment he is completely asymptomatic.  Second troponin within normal limits blood pressure resolved after IV fluids.  Recommended continued p.o. hydration at home.  Also recommended he stop his Entresto until communicate with his cardiologist regarding his relatively profound hypotension. Required aggressive IV hydration. Given symptomatic improvement patient stable for outpatient management accepted, process.  I offered hospital observation but patient did not want. Disposition:  I have considered need for hospitalization, however, considering all of the above, I believe this patient is stable for discharge at this time.  Patient/family educated about specific return precautions for given chief complaint and symptoms.  Patient/family educated about follow-up with PCP.     Patient/family expressed understanding of return precautions and need for follow-up. Patient spoken to regarding all imaging and laboratory results and appropriate follow up for these results. All education provided in verbal form with additional information in written form. Time was allowed for answering of patient questions. Patient  discharged.    Emergency Department Medication Summary:   Medications  sodium chloride 0.9 % bolus 1,000 mL (0 mLs Intravenous Stopped 10/07/22 1448)  iohexol (OMNIPAQUE) 350 MG/ML injection 75 mL (75 mLs Intravenous Contrast Given 10/07/22 1355)  lactated ringers bolus 500 mL (0 mLs Intravenous Stopped 10/07/22 1723)           Glyn Ade, MD 10/07/22 1909

## 2022-10-07 NOTE — ED Provider Notes (Signed)
[place] EMERGENCY DEPARTMENT AT [facility] Provider Note   CSN: [id number] Arrival date & time: 10/07/22  1131     History  Chief Complaint  Patient presents with   Chest Pain    Alexander Saunders is a 72 y.o. male with areas of multiple myeloma presenting to the ED with complaint of chest pain.  Patient reports he developed chest pressure while he was on a walk outside yesterday.  He said it was quite humid yesterday.  He denies associated lightheadedness or difficulty breathing.  His symptoms resolved when he got home and he is currently asymptomatic, but he spoke to the on-call staff member at his cardiology office who advised to come to the ER.  He denies any history of MI.  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  He states he takes Entresto as well as Cozaar, and his blood pressure typically is around 100-110 systolic.  He reports he did not drink a lot of water yesterday or today.  He did take his morning blood pressure medicine.  He has not eaten anything yet today.  He denies any active chest pain.  He reports finishing antibiotics 2 weeks ago for "bacterial bronchitis"  Echo 09/16/22 EF 50-55%, improved from prior imaging  HPI     Home Medications Prior to Admission medications   Medication Sig Start Date End Date Taking? Authorizing Provider  acyclovir (ZOVIRAX) 400 MG tablet Take 1 tablet (400 mg total) by mouth daily. 03/31/22   Johney Maine, MD  aspirin EC 81 MG tablet Take 81 mg by mouth daily. 01/29/17   [provider]  B Complex Vitamins (B-COMPLEX/B-12) TABS Take 1 tablet by mouth daily. Patient not taking: Reported on 07/23/2022    [provider]  bisacodyl (DULCOLAX) 5 MG EC tablet Take 5 mg by mouth daily as needed for moderate constipation.    [provider]  carboxymethylcellulose (REFRESH PLUS) 0.5 % SOLN Apply 2 drops to eye daily as  needed (as needed). 01/23/22   [provider]  carvedilol (COREG) 12.5 MG tablet TAKE 1 TABLET(12.5 MG) BY MOUTH TWICE DAILY 09/24/22   Runell Gess, MD  cholecalciferol (VITAMIN D3) 25 MCG (1000 UNIT) tablet Take 1,000 Units by mouth daily.    [provider]  diphenhydramine-acetaminophen (TYLENOL PM EXTRA STRENGTH) 25-500 MG TABS tablet Take 1 tablet by mouth at bedtime as needed (pain).    [provider]  gabapentin (NEURONTIN) 300 MG capsule TAKE 3 CAPSULES BY MOUTH THREE TIMES DAILY IN THE MORNING AND IN THE AFTERNOON AND AT NIGHT 04/22/22   Johney Maine, MD  Multiple Vitamin (MULTIVITAMIN) capsule Take 1 capsule by mouth daily.    [provider]  sacubitril-valsartan (ENTRESTO) 24-26 MG Take 1 tablet by mouth 2 (two) times daily. 06/03/22   Runell Gess, MD  sildenafil (VIAGRA) 50 MG tablet daily as needed. 07/16/21   [provider]  sodium chloride (OCEAN) 0.65 % SOLN nasal spray Place 1 spray into both nostrils daily as needed for congestion.    [provider]  spironolactone (ALDACTONE) 25 MG tablet Take 1 tablet (25 mg total) by mouth daily. 12/05/21   Runell Gess, MD      Allergies    Jardiance [empagliflozin]    Review of Systems   Review of Systems  Physical Exam Updated Vital Signs BP (!) 94/48 (BP Location: Left Arm)  Pulse 88   Temp 98.7 F (37.1 C) (Oral)   Resp 15   Ht 6' (1.829 m)   Wt 91 kg   SpO2 96%   BMI 27.21 kg/m  Physical Exam Constitutional:      General: He is not in acute distress. HENT:     Head: Normocephalic and atraumatic.  Eyes:     Conjunctiva/sclera: Conjunctivae normal.     Pupils: Pupils are equal, round, and reactive to light.  Cardiovascular:     Rate and Rhythm: Normal rate and regular rhythm.  Pulmonary:     Effort: Pulmonary effort is normal. No respiratory distress.  Abdominal:     General: There is no distension.     Tenderness: There is no abdominal  tenderness.  Skin:    General: Skin is warm and dry.  Neurological:     General: No focal deficit present.     Mental Status: He is alert. Mental status is at baseline.  Psychiatric:        Mood and Affect: Mood normal.        Behavior: Behavior normal.     ED Results / Procedures / Treatments   Labs (all labs ordered are listed, but only abnormal results are displayed) Labs Reviewed  BASIC METABOLIC PANEL - Abnormal; Notable for the following components:      Result Value   Sodium 134 (*)    CO2 21 (*)    Glucose, Bld 125 (*)    BUN 25 (*)    Creatinine, Ser 1.27 (*)    GFR, Estimated 60 (*)    All other components within normal limits  CBC WITH DIFFERENTIAL/PLATELET - Abnormal; Notable for the following components:   RBC 2.77 (*)    Hemoglobin 8.9 (*)    HCT 27.2 (*)    Abs Immature Granulocytes 0.13 (*)    All other components within normal limits  BRAIN NATRIURETIC PEPTIDE - Abnormal; Notable for the following components:   B Natriuretic Peptide 297.2 (*)    All other components within normal limits  TROPONIN I (HIGH SENSITIVITY)  TROPONIN I (HIGH SENSITIVITY)    EKG EKG Interpretation Date/Time:  Tuesday October 07 2022 14:08:26 EDT Ventricular Rate:  84 PR Interval:  187 QRS Duration:  95 QT Interval:  388 QTC Calculation: 459 R Axis:   75  Text Interpretation: Sinus rhythm Confirmed by Alvester Chou [phone]) on 10/07/2022 2:10:55 PM  Radiology DG Chest 2 View  Result Date: 10/07/2022 CLINICAL DATA:  Chest pressure.  History of myeloma. EXAM: CHEST - 2 VIEW COMPARISON:  X-ray 11/03/2020. FINDINGS: No consolidation, pneumothorax or effusion. No edema. Normal cardiopericardial silhouette. Overlapping cardiac leads. IMPRESSION: No acute cardiopulmonary disease. Electronically Signed   By: Karen Kays M.D.   On: 10/07/2022 14:07    Procedures Procedures    Medications Ordered in ED Medications  lactated ringers bolus 500 mL (has no administration in  time range)  sodium chloride 0.9 % bolus 1,000 mL (0 mLs Intravenous Stopped 10/07/22 1448)  iohexol (OMNIPAQUE) 350 MG/ML injection 75 mL (75 mLs Intravenous Contrast Given 10/07/22 1355)    ED Course/ Medical Decision Making/ A&P Clinical Course as of 10/07/22 1531  Tue Oct 07, 2022  1231 BP 108/58 mmhg [MT]  1503 Stable 72 YOM with a chief complaint of chest pain.  HX of MM Pain was yesterday and resolved. HX of lower than average BP but hypotensive today.  Multiple new BP meds (Coreg) added on to ENTRESTO and Aldactone  BP measurement.  [CC]  1505 Signed out to Dr Len Childs pending follow up on CT PE, repeat BP assessment [MT]    Clinical Course User Index [CC] Glyn Ade, MD [MT] Renaye Rakers Kermit Balo, MD                                 Medical Decision Making Amount and/or Complexity of Data Reviewed Labs: ordered. Radiology: ordered. ECG/medicine tests: ordered.  Risk Prescription drug management.   This patient presents to the Emergency Department with complaint of chest pain. This involves an extensive number of treatment options, and is a complaint that carries with it a high risk of complications and morbidity, given the patient's comorbidity, including HTN, HLD .The differential diagnosis includes ACS vs Pneumothorax vs Reflux/Gastritis vs MSK pain vs Pneumonia vs other.  I ordered, reviewed, and interpreted labs.  Pertinent results include initial labs largely unremarkable.  Initial troponin is negative.  BNP 297.  Creatinine 1.27.  Hemoglobin with some mild worsening of anemia, 8.9 today, which may be related to current treatment regimen. I ordered imaging studies which included dg chest, ct pe  Patient's lower blood pressure may be also related to overmedication with blood pressure medicines.  He reports his doctor was trying to scale back on his BP meds.  He is on Entresto as well as Coreg.  IV fluids ordered for borderline hypotension External records  obtained and reviewed showing echo Aug 2024 with EF 50-55% I personally reviewed the patients ECG which showed sinus rhythm with no acute ischemic findings         Final Clinical Impression(s) / ED Diagnoses Final diagnoses:  None    Rx / DC Orders ED Discharge Orders     None         Terald Sleeper, MD 10/07/22 1531

## 2022-10-07 NOTE — Telephone Encounter (Signed)
Pt contacted regarding MyChart message. Pt states he is still having some chest pain. Pt encouraged to go to the ED to be evaluated. Pt encouraged to address right away. Pt acknowledged information and verbalized understanding.

## 2022-10-09 NOTE — Progress Notes (Signed)
Attempted to contact pt to set up phone appt. Left voice message and will try again.

## 2022-10-17 ENCOUNTER — Inpatient Hospital Stay: Payer: Medicare Other | Attending: Oncology | Admitting: Hematology

## 2022-10-17 ENCOUNTER — Telehealth: Payer: Self-pay | Admitting: Cardiovascular Disease

## 2022-10-17 DIAGNOSIS — C9000 Multiple myeloma not having achieved remission: Secondary | ICD-10-CM | POA: Insufficient documentation

## 2022-10-17 DIAGNOSIS — Z9484 Stem cells transplant status: Secondary | ICD-10-CM | POA: Insufficient documentation

## 2022-10-17 NOTE — Telephone Encounter (Signed)
Was seen in the ER on 9/3 due to pre-syncope. Was recommended he stop his Entresto until communicate with his cardiologist regarding his relatively profound hypotension

## 2022-10-17 NOTE — Progress Notes (Signed)
HEMATOLOGY/ONCOLOGY PHONE VISIT NOTE  Date of Service: 10/17/22  Patient Care Team: Jackie Plum, MD as PCP - General (Internal Medicine) Runell Gess, MD as PCP - Cardiology (Cardiology)  CHIEF COMPLAINTS/PURPOSE OF CONSULTATION:  Evaluation and management of Kappa Light Chain Multiple Myeloma  HISTORY OF PRESENTING ILLNESS:   Alexander Saunders is a wonderful 72 y.o. male who is here for evaluation and management of multiple myeloma. Patient was following with Dr. Clelia Croft and has been transferred to [place]. Patient was diagnosed with multiple myeloma in 2018 which relapsed in 2022 with prior treatment as noted below.  Prior Therapy:   He is status post surgical decompression of an epidural mass on 09/04/2016 and the pathology showed a plasma cell neoplasm.   He is S/P adjuvant radiation therapy to the thoracic spine to be completed on 10/14/2016.   Velcade 1.5 mg/m weekly with dexamethasone 20 mg and Cytoxan 600 mg po. Therapy started on 10/24/2016.    High-dose chemotherapy followed by stem cell transplant: Melphalan 200 mg/m2 completed on 06/17/17 followed by autologous stem cell rescue 06/18/17.  He achieved complete response.     Zometa 4 mg every 3 months started in February 2020.  Last treatment given in January 2022.     Revlimid 10 mg daily for 21 days out of a 28-day started in February 2020.  Therapy discontinued in November 2022 because of worsening anemia.   Carfilzomib 20 mg/m weekly started on February 19, 2021.  Therapy stopped in March 2023 for presumed cardiomyopathy complication.     Daratumumab 1800 mg subcutaneous injection weekly started on February 19, 2021 with Dexamethasone weekly.     Current treatment: Patient is currently on monthly Daratumumab 1800 mg subcutaneous injections per protocol currently every 28 days with Dexamethasone weekly 20 mg.  Patient notes he is doing well overall without any new medical concern since his last visit with  Dr. Clelia Croft. He complains of weight gain from Dexamethasone, which he is currently trying to loss. He denies any toxicities with Daratumumab.   Patient notes that he tested positive for COVID-19 around 2-3 months and notes that his symptoms have improved since.  He denies fever, chills, night sweats, abdominal pain, back pain, chest pain, or leg swelling. However, he does complain of intermittent hip pain.  He has received his influenza vaccine, but denies COVID-19 Booster ad RSV vaccine.    INTERVAL HISTORY:  Alexander Saunders is a wonderful 72 y.o. male who is here for evaluation and management of multiple myeloma.   Patient was last seen by me on 09/26/2022 and noted having acute bacterial bronchitis 3 weeks prior. He complained of intermittent chest discomfort/soreness, mild left hip pain related to osteoarthritis, and occasional fatigue.   He presented to the ED on 10/07/2022 for chest pain. CTA of chest was negative for acute PE or thoracic aortic dissection. Results did show multiple lytic bone lesions as before consistent with given diagnosis of multiple myeloma. Overall, findings appeared to be unchanged.   I connected with [name] on 10/17/22 at  8:40 AM EDT by telephone visit and verified that I am speaking with the correct person using two identifiers.   I discussed the limitations, risks, security and privacy concerns of performing an evaluation and management service by telemedicine and the availability of in-person appointments. I also discussed with the patient that there may be a patient responsible charge related to this service. The patient expressed understanding and agreed to proceed.  Other persons participating in the visit and their role in the encounter: none   Patient's location: home  Provider's location: [facility]   Chief Complaint: evaluation and management of multiple myeloma    Today, he complains of fatigue and intermittent chest wall discomfort/soreness. His  cardiac medication, including Entresto, was held due to low blood pressures. He has not restarted these and had been recommended to connect with a cardiologist about adjusting these medications.    MEDICAL HISTORY:  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   Stem cells transplant status (HCC) 06/2017    SURGICAL HISTORY: Past Surgical History:  Procedure Laterality Date   APPENDECTOMY     BONE MARROW BIOPSY  07/2018   COLONOSCOPY     KNEE ARTHROSCOPY     r/knee   LAMINECTOMY N/A 09/04/2016   Procedure: THORACIC FOUR-SIX LAMINECTOMY FOR RESECTION OF TUMOR;  Surgeon: Ditty, Loura Halt, MD;  Location: [facility] OR;  Service: Neurosurgery;  Laterality: N/A;    SOCIAL HISTORY: Social History   Socioeconomic History   Marital status: Married    Spouse name: Not on file   Number of children: Not on file   Years of education: Not on file   Highest education level: Not on file  Occupational History   Not on file  Tobacco Use   Smoking status: Never   Smokeless tobacco: Never  Vaping Use   Vaping status: Never Used  Substance and Sexual Activity   Alcohol use: Yes    Comment: occ   Drug use: Never   Sexual activity: Not on file  Other Topics Concern   Not on file  Social History Narrative   He worked as a Sports coach level of education:  college   He is back in college at [employer] studying exercise physiology.       Right handed      Single story home   Social Determinants of Health   Financial Resource Strain: Not on file  Food Insecurity: No Food Insecurity (09/11/2021)   Hunger  Vital Sign    Worried About Running Out of Food in the Last Year: Never true    Ran Out of Food in the Last Year: Never true  Transportation Needs: No Transportation Needs (09/11/2021)   PRAPARE - Administrator, Civil Service (Medical): No    Lack of Transportation (Non-Medical): No  Physical Activity: Not on file  Stress: Not on file  Social Connections: Not on file  Intimate Partner Violence: Not on file    FAMILY HISTORY: Family History  Problem Relation Age of Onset   Hypertension Mother    Cancer Mother    Lung cancer Mother    Hypertension Father    Colon cancer Neg Hx    Colon polyps Neg Hx    Esophageal cancer Neg Hx    Rectal cancer Neg Hx    Stomach cancer Neg Hx     ALLERGIES:  is allergic to jardiance [empagliflozin].  MEDICATIONS:  Current Outpatient Medications  Medication Sig Dispense  Refill   acyclovir (ZOVIRAX) 400 MG tablet Take 1 tablet (400 mg total) by mouth daily. 90 tablet 3   aspirin EC 81 MG tablet Take 81 mg by mouth daily.     B Complex Vitamins (B-COMPLEX/B-12) TABS Take 1 tablet by mouth daily. (Patient not taking: Reported on 07/23/2022)     bisacodyl (DULCOLAX) 5 MG EC tablet Take 5 mg by mouth daily as needed for moderate constipation.     carboxymethylcellulose (REFRESH PLUS) 0.5 % SOLN Apply 2 drops to eye daily as needed (as needed).     carvedilol (COREG) 12.5 MG tablet TAKE 1 TABLET(12.5 MG) BY MOUTH TWICE DAILY 180 tablet 3   cholecalciferol (VITAMIN D3) 25 MCG (1000 UNIT) tablet Take 1,000 Units by mouth daily.     diphenhydramine-acetaminophen (TYLENOL PM EXTRA STRENGTH) 25-500 MG TABS tablet Take 1 tablet by mouth at bedtime as needed (pain).     gabapentin (NEURONTIN) 300 MG capsule TAKE 3 CAPSULES BY MOUTH THREE TIMES DAILY IN THE MORNING AND IN THE AFTERNOON AND AT NIGHT 270 capsule 3   Multiple Vitamin (MULTIVITAMIN) capsule Take 1 capsule by mouth daily.     sacubitril-valsartan (ENTRESTO) 24-26 MG Take 1 tablet by mouth  2 (two) times daily. 60 tablet 3   sildenafil (VIAGRA) 50 MG tablet daily as needed.     sodium chloride (OCEAN) 0.65 % SOLN nasal spray Place 1 spray into both nostrils daily as needed for congestion.     spironolactone (ALDACTONE) 25 MG tablet Take 1 tablet (25 mg total) by mouth daily. 90 tablet 3   Current Facility-Administered Medications  Medication Dose Route Frequency Provider Last Rate Last Admin   sacubitril-valsartan (ENTRESTO) 24-26 mg per tablet  1 tablet Oral BID Jodelle Gross, NP        REVIEW OF SYSTEMS:    10 Point review of Systems was done is negative except as noted above.    PHYSICAL EXAMINATION: TELEMEDICINE VISIT ECOG PERFORMANCE STATUS: 1 - Symptomatic but completely ambulatory .There were no vitals taken for this visit.   LABORATORY DATA:  I have reviewed the data as listed .    Latest Ref Rng & Units 10/07/2022   12:15 PM 09/26/2022   11:11 AM 08/29/2022    8:23 AM  CBC  WBC 4.0 - 10.5 K/uL 7.8  7.4  7.9   Hemoglobin 13.0 - 17.0 g/dL 8.9  78.2  95.6   Hematocrit 39.0 - 52.0 % 27.2  28.5  35.4   Platelets 150 - 400 K/uL 313  288  300    .    Latest Ref Rng & Units 10/07/2022   12:15 PM 09/26/2022   11:11 AM 08/29/2022    8:23 AM  CMP  Glucose 70 - 99 mg/dL 213  086  578   BUN 8 - 23 mg/dL 25  16  13    Creatinine 0.61 - 1.24 mg/dL 4.69  6.29  5.28   Sodium 135 - 145 mmol/L 134  140  142   Potassium 3.5 - 5.1 mmol/L 4.3  4.2  3.7   Chloride 98 - 111 mmol/L 103  106  106   CO2 22 - 32 mmol/L 21  27  29    Calcium 8.9 - 10.3 mg/dL 9.4  9.8  9.8   Total Protein 6.5 - 8.1 g/dL  6.5  6.4   Total Bilirubin 0.3 - 1.2 mg/dL  0.4  0.4   Alkaline Phos 38 - 126 U/L  25  29  AST 15 - 41 U/L  12  11   ALT 0 - 44 U/L  9  8     Surgical Pathology  THIS IS AN ADDENDUM REPORT  CASE: [id number] PATIENT: Vander Voshell Bone Marrow Report Addendum   Reason for Addendum #1:  Additional special stains Reason for Addendum #2:  Cytogenetics  results  Clinical History: multiple myeloma   DIAGNOSIS:  BONE MARROW, ASPIRATE, CLOT, CORE: -  Hypercellular marrow involved by plasma cell myeloma -  See comment  PERIPHERAL BLOOD: -  Macrocytic anemia -  See complete blood cell count  COMMENT:  The findings are consistent with recurrence of the patient's previously diagnosed plasma cell myeloma.  For completeness, immunohistochemistry ([id number], kappa and lambda by in situ hybridization) are pending and will be reported in an addendum.  MICROSCOPIC DESCRIPTION:  PERIPHERAL BLOOD SMEAR: The peripheral blood has a macrocytic anemia. There is no significant rouleaux formation identified.  Plasmacytoid lymphocytes are seen.  Platelets are within normal limits.  BONE MARROW ASPIRATE: Spicular, cellular and adequate for evaluation Erythroid precursors: Diminished numbers, no overt dysplasia Granulocytic precursors: Diminished numbers, no overt dysplasia Megakaryocytes: Present Lymphocytes/plasma cells: There is a marked increase of atypical plasma cells.  The plasma cells are enlarged and multinucleated.  There is no lymphocytosis.  TOUCH PREPARATIONS: No additional findings as compared to the aspirate smears  CLOT AND BIOPSY: Examination of the bone marrow core biopsy and clot section reveals a hypercellular marrow for age (> 95%) involved by plasma cell neoplasm.  Background hematopoiesis is markedly diminished. Megakaryocytes are present.  There are no granulomas identified.  IRON STAIN: Iron stains are performed on a bone marrow aspirate or touch imprint smear and section of clot. The controls stained appropriately.       Storage Iron: Present (aspirate smears)      Ring Sideroblasts: Not identified    RADIOGRAPHIC STUDIES: I have personally reviewed the radiological images as listed and agreed with the findings in the report. CT Angio Chest PE W and/or Wo Contrast  Result Date: 10/07/2022 CLINICAL DATA:  Pulmonary  embolism (PE) suspected, high prob. Chest pain, fatigue EXAM: CT ANGIOGRAPHY CHEST WITH CONTRAST TECHNIQUE: Multidetector CT imaging of the chest was performed using the standard protocol during bolus administration of intravenous contrast. Multiplanar CT image reconstructions and MIPs were obtained to evaluate the vascular anatomy. RADIATION DOSE REDUCTION: This exam was performed according to the departmental dose-optimization program which includes automated exposure control, adjustment of the mA and/or kV according to patient size and/or use of iterative reconstruction technique. CONTRAST:  75mL OMNIPAQUE IOHEXOL 350 MG/ML SOLN COMPARISON:  PET-CT 12/26/2020 and previous FINDINGS: Cardiovascular: No pericardial effusion. Satisfactory opacification of pulmonary arteries noted, and there is no evidence of pulmonary emboli. Scattered coronary calcifications. Adequate contrast opacification of the thoracic aorta with no evidence of dissection, aneurysm, or stenosis. There is classic 3-vessel brachiocephalic arch anatomy without proximal stenosis. Mediastinum/Nodes: No mediastinal hematoma, mass, or adenopathy. Lungs/Pleura: No pleural effusion. No pneumothorax. Minimal linear scarring/atelectasis in the lung bases. Upper Abdomen: No acute findings. Musculoskeletal: Post posterior thoracic decompression T4-T7. Some sclerosis around the lytic lesion in the T5 vertebral body and pedicles since prior exam. Additional smaller lytic lesions in the more inferior thoracic spine, sternum, and left scapula as before. No acute findings. Review of the MIP images confirms the above findings. IMPRESSION: 1. Negative for acute PE or thoracic aortic dissection. 2. Scattered coronary calcifications. 3. Multiple lytic bone lesions as before consistent with given diagnosis of  multiple myeloma. Electronically Signed   By: Corlis Leak M.D.   On: 10/07/2022 15:42   DG Chest 2 View  Result Date: 10/07/2022 CLINICAL DATA:  Chest  pressure.  History of myeloma. EXAM: CHEST - 2 VIEW COMPARISON:  X-ray 11/03/2020. FINDINGS: No consolidation, pneumothorax or effusion. No edema. Normal cardiopericardial silhouette. Overlapping cardiac leads. IMPRESSION: No acute cardiopulmonary disease. Electronically Signed   By: Karen Kays M.D.   On: 10/07/2022 14:07   Pet/ct 12/2020  IMPRESSION: 1. No evidence of FDG avid nodal or visceral metastatic disease in the chest, abdomen or pelvis. 2. Numerous osseous lytic lesions are similar in appearance when compared with 2018 prior. No focal FDG uptake above background marrow activity. Findings are compatible with history of multiple myeloma. 3. Coronary artery calcifications of the LAD and circumflex. 4. Mild aortic Atherosclerosis (ICD10-I70.0).     Electronically Signed   By: Allegra Lai M.D.   On: 12/28/2020 12:34   Pet/ct 03/16/2017 Impression   1.  Focal increased FDG uptake in the spinous process of T3 is consistent with site of multiple myeloma. 2.  No additional sites of increased metabolic activity to suggest cluster of myelomatous cells. 3.  Innumerable lytic lesions in the axial skeleton.    ASSESSMENT & PLAN:   72 y.o. male with:  Multiple Myeloma -First diagnosed in 2018 an relapsed in 2022.  Cytogenetics normal male karyotype Mol Cy - not done  PLAN:  -CBC from 10/07/2022 showed WBC of 7.8K, hemoglobin of 8.9, and platelets of 313K. -Patient was noted to have some developing anemia over the last couple months and slightly increasing light chains with increasing kappa lambda ratio.  -Discussed re-evaluating myeloma with bone marrow biopsy -will plan for repeat PET scan -In the interim, he will continue monthly daratumumab treatment  -connect with cardiology to consider adjusting blood pressure medications  FOLLOW-UP: May cancel appointment with Karena Addison on 10/24/2022. Keep his labs and monthly daratumumab treatments as per integrated scheduling. PET  CT scan as soon as possible in a week CT-guided bone marrow biopsy in 1 week MD visit with treatment after 10/24/2022 Patient was recommended to call and follow-up with his cardiologist  The total time spent in the appointment was *** minutes* .  All of the patient's questions were answered with apparent satisfaction. The patient knows to call the clinic with any problems, questions or concerns.   Wyvonnia Lora MD MS AAHIVMS [facility] Hematology/Oncology Physician [facility]  .*Total Encounter Time as defined by the Centers for Medicare and Medicaid Services includes, in addition to the face-to-face time of a patient visit (documented in the note above) non-face-to-face time: obtaining and reviewing outside history, ordering and reviewing medications, tests or procedures, care coordination (communications with other health care professionals or caregivers) and documentation in the medical record.    I,Mitra Faeizi,acting as a Neurosurgeon for Wyvonnia Lora, MD.,have documented all relevant documentation on the behalf of Wyvonnia Lora, MD,as directed by  Wyvonnia Lora, MD while in the presence of Wyvonnia Lora, MD.  ***

## 2022-10-17 NOTE — Telephone Encounter (Signed)
Called patient and straight to VM.  LM to call office

## 2022-10-20 NOTE — Telephone Encounter (Signed)
Unable to reach patient. He can follow up at scheduled appt on 10/14

## 2022-10-20 NOTE — Telephone Encounter (Signed)
Call to patient and LM to call office

## 2022-10-21 ENCOUNTER — Other Ambulatory Visit: Payer: Self-pay

## 2022-10-21 NOTE — Progress Notes (Unsigned)
Cardiology Clinic Note   Patient Name: Alexander Saunders Date of Encounter: 10/22/2022  Primary Care Provider:  Jackie Plum, MD Primary Cardiologist:  Nanetta Batty, MD  Patient Profile    Alexander Saunders 72 year old male presents to the clinic today for evaluation of his hypotension and presyncope.  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   Stem cells transplant status (HCC) 06/2017   Past Surgical History:  Procedure Laterality Date   APPENDECTOMY     BONE MARROW BIOPSY  07/2018   COLONOSCOPY     KNEE ARTHROSCOPY     r/knee   LAMINECTOMY N/A 09/04/2016   Procedure: THORACIC FOUR-SIX LAMINECTOMY FOR RESECTION OF TUMOR;  Surgeon: Ditty, Loura Halt, MD;  Location: [facility] OR;  Service: Neurosurgery;  Laterality: N/A;    Allergies  Allergies  Allergen Reactions   Jardiance [Empagliflozin] Other (See Comments)    UTI like symptoms    History of Present Illness    Alexander Saunders has a PMH of multiple myeloma and is status post chemotherapy, stem cell transplant (followed by Dr. Candise Che hematology oncology, nonischemic cardiomyopathy with moderately severe LV dysfunction (LVEF 30-35% on 09/06/2021.  Echocardiogram 5/24 showed an LVEF of 50-55%, G1 DD, and no significant valvular abnormalities.  He did not tolerate Jardiance due to UTI symptoms.  He was seen in follow-up by Dr. Allyson Sabal.  During that time his carvedilol was increased to 12.5 mg daily.  He was started on Entresto 24-26 twice daily.  His follow-up BMP showed a potassium of 4.1 and a creatinine of  1.01.  He was last seen by Joni Reining, NP on 06/27/2022.  During that time he was tolerating his carvedilol well.  He was exercising 4-6 days/week and walking daily.  He denied shortness of breath and dizziness.  He had no lower extremity swelling.  He reported a family issue with his sister who was at end-of-life due to her emphysema.  He was planning to travel to [place].  He continued to follow with oncology for his multiple myeloma.  His blood pressure was noted to be 116/70.  His pulse was 74.  His medications were continued.  He was felt to be well compensated.  Follow-up echocardiogram was scheduled.  Echocardiogram 09/16/2022 showed an LVEF of 50-55%, G1 DD, mild dilation of the aortic root measuring 40 mm and no significant valvular abnormalities.  He contacted nurse triage line on 10/17/2022.  He requested an earlier appointment.  He was seen in the emergency department on 10/07/2022 due to presyncope.  It was recommended that he stop his Entresto and follow-up with cardiology.  He presents to the clinic today for follow-up evaluation and states he had bronchitis at the beginning of the summer.  During that time he received a course of antibiotics.  He continues to receive treatment for his multiple myeloma.  He has noticed increased fatigue, poor appetite, shortness of breath, and occasional intermittent periods of chest discomfort.  His recent blood work in the emergency department on 10/07/2022  showed a hemoglobin of 8.9 and hematocrit of 27.2.  His wife presents with him to the clinic today and reports that he has not been eating well.  He reports adequate p.o. hydration.  I recommended high iron diet and increase caloric intake.  I will order CBC, BMP, give list of iron rich foods, we will plan to refer to GI for upper endoscopy pending lab work and keep follow-up in 1 month.  Today he  melena, hematuria, hemoptysis, diaphoresis, weakness, presyncope, syncope, orthopnea, and PND.   Home  Medications    Prior to Admission medications   Medication Sig Start Date End Date Taking? Authorizing Provider  acyclovir (ZOVIRAX) 400 MG tablet Take 1 tablet (400 mg total) by mouth daily. 03/31/22   Johney Maine, MD  aspirin EC 81 MG tablet Take 81 mg by mouth daily. 01/29/17   [provider]  B Complex Vitamins (B-COMPLEX/B-12) TABS Take 1 tablet by mouth daily. Patient not taking: Reported on 07/23/2022    [provider]  bisacodyl (DULCOLAX) 5 MG EC tablet Take 5 mg by mouth daily as needed for moderate constipation.    [provider]  carboxymethylcellulose (REFRESH PLUS) 0.5 % SOLN Apply 2 drops to eye daily as needed (as needed). 01/23/22   [provider]  carvedilol (COREG) 12.5 MG tablet TAKE 1 TABLET(12.5 MG) BY MOUTH TWICE DAILY 09/24/22   Runell Gess, MD  cholecalciferol (VITAMIN D3) 25 MCG (1000 UNIT) tablet Take 1,000 Units by mouth daily.    [provider]  diphenhydramine-acetaminophen (TYLENOL PM EXTRA STRENGTH) 25-500 MG TABS tablet Take 1 tablet by mouth at bedtime as needed (pain).    [provider]  gabapentin (NEURONTIN) 300 MG capsule TAKE 3 CAPSULES BY MOUTH THREE TIMES DAILY IN THE MORNING AND IN THE AFTERNOON AND AT NIGHT 04/22/22   Johney Maine, MD  Multiple Vitamin (MULTIVITAMIN) capsule Take 1 capsule by mouth daily.    [provider]  sacubitril-valsartan (ENTRESTO) 24-26 MG Take 1 tablet by mouth 2 (two) times daily. 06/03/22   Runell Gess, MD  sildenafil (VIAGRA) 50 MG tablet daily as needed. 07/16/21   [provider]  sodium chloride (OCEAN) 0.65 % SOLN nasal spray Place 1 spray into both nostrils daily as needed for congestion.    [provider]  spironolactone (ALDACTONE) 25 MG tablet Take 1 tablet (25 mg total) by mouth daily. 12/05/21   Runell Gess, MD    Family History    Family History  Problem Relation Age of Onset   Hypertension  Mother    Cancer Mother    Lung cancer Mother    Hypertension Father    Colon cancer Neg Hx    Colon polyps Neg Hx    Esophageal cancer Neg Hx    Rectal cancer Neg Hx    Stomach cancer Neg Hx    He indicated that his mother is deceased. He indicated that his father is deceased. He indicated that all of his four sisters are alive. He indicated that all of his three brothers are alive. He indicated that his maternal grandmother is deceased. He indicated that his maternal grandfather is deceased. He indicated that his paternal grandmother is deceased. He indicated that his paternal grandfather is deceased. He indicated that his daughter is alive. He indicated that his son is alive. He indicated that the status of his neg hx is unknown.  Social History    Social History   Socioeconomic  History   Marital status: Married    Spouse name: Not on file   Number of children: Not on file   Years of education: Not on file   Highest education level: Not on file  Occupational History   Not on file  Tobacco Use   Smoking status: Never   Smokeless tobacco: Never  Vaping Use   Vaping status: Never Used  Substance and Sexual Activity   Alcohol use: Yes    Comment: occ   Drug use: Never   Sexual activity: Not on file  Other Topics Concern   Not on file  Social History Narrative   He worked as a Sports coach level of education:  college   He is back in college at [employer] studying exercise physiology.       Right handed      Single story home   Social Determinants of Health   Financial Resource Strain: Not on file  Food Insecurity: No Food Insecurity (09/11/2021)   Hunger Vital Sign    Worried About Running Out of Food in the Last Year: Never true    Ran Out of Food in the Last Year: Never true  Transportation Needs: No Transportation Needs (09/11/2021)   PRAPARE - Administrator, Civil Service (Medical): No    Lack of Transportation (Non-Medical): No  Physical  Activity: Not on file  Stress: Not on file  Social Connections: Not on file  Intimate Partner Violence: Not on file     Review of Systems    General:  No chills, fever, night sweats or weight changes.  Cardiovascular:  No chest pain, dyspnea on exertion, edema, orthopnea, palpitations, paroxysmal nocturnal dyspnea. Dermatological: No rash, lesions/masses Respiratory: No cough, dyspnea Urologic: No hematuria, dysuria Abdominal:   No nausea, vomiting, diarrhea, bright red blood per rectum, melena, or hematemesis Neurologic:  No visual changes, wkns, changes in mental status. All other systems reviewed and are otherwise negative except as noted above.  Physical Exam    VS:  BP (!) 100/44 (BP Location: Left Arm, Patient Position: Sitting, Cuff Size: Normal)   Pulse (!) 106   Ht 6' (1.829 m)   Wt 189 lb (85.7 kg)   BMI 25.63 kg/m  , BMI Body mass index is 25.63 kg/m. GEN: Well nourished, well developed, in no acute distress. HEENT: normal. Neck: Supple, no JVD, carotid bruits, or masses. Cardiac: RRR, no murmurs, rubs, or gallops. No clubbing, cyanosis, edema.  Radials/DP/PT 2+ and equal bilaterally.  Respiratory:  Respirations regular and unlabored, clear to auscultation bilaterally. GI: Soft, nontender, nondistended, BS + x 4. MS: no deformity or atrophy. Skin: warm and dry, no rash. Neuro:  Strength and sensation are intact. Psych: Normal affect.  Accessory Clinical Findings    Recent Labs: 09/26/2022: ALT 9 10/07/2022: B Natriuretic Peptide 297.2; BUN 25; Creatinine, Ser 1.27; Hemoglobin 8.9; Platelets 313; Potassium 4.3; Sodium 134   Recent Lipid Panel No results found for: "CHOL", "TRIG", "HDL", "CHOLHDL", "VLDL", "LDLCALC", "LDLDIRECT"       ECG personally reviewed by me today- EKG Interpretation Date/Time:  Wednesday October 22 2022 11:34:46 EDT Ventricular Rate:  106 PR Interval:  162 QRS Duration:  90 QT Interval:  342 QTC Calculation: 454 R  Axis:   54  Text Interpretation: Sinus tachycardia Possible Left atrial enlargement When compared with ECG of 22-Oct-2022 11:33, No significant change was found Confirmed by [name] [phone]) on 10/22/2022 11:43:37 AM  Echocardiogram 06/04/2022  1. Left ventricular ejection fraction, by estimation, is 50 to 55%. The  left ventricle has low normal function. The left ventricle has no regional  wall motion abnormalities. There is mild left ventricular hypertrophy.  Left ventricular diastolic  parameters are consistent with Grade I diastolic dysfunction (impaired  relaxation).   2. Right ventricular systolic function is normal. The right ventricular  size is normal. There is normal pulmonary artery systolic pressure.   3. The mitral valve is normal in structure. No evidence of mitral valve  regurgitation. No evidence of mitral stenosis.   4. The aortic valve is calcified. There is mild calcification of the  aortic valve. There is mild thickening of the aortic valve. Aortic valve  regurgitation is not visualized. No aortic stenosis is present.   5. The inferior vena cava is normal in size with greater than 50%  respiratory variability, suggesting right atrial pressure of 3 mmHg.    Echocardiogram 09/16/2022  IMPRESSIONS     1. Left ventricular ejection fraction, by estimation, is 50 to 55%. Left  ventricular ejection fraction by 3D volume is 52 %. The left ventricle has  low normal function. The left ventricle has no regional wall motion  abnormalities. Left ventricular  diastolic parameters are consistent with Grade I diastolic dysfunction  (impaired relaxation).   2. Right ventricular systolic function is normal. The right ventricular  size is normal.   3. The mitral valve is normal in structure. Mild mitral valve  regurgitation. No evidence of mitral stenosis.   4. The aortic valve is tricuspid. Aortic valve regurgitation is not  visualized. No aortic stenosis is present.    5. Aortic dilatation noted. There is mild dilatation of the aortic root,  measuring 40 mm.   6. The inferior vena cava is normal in size with greater than 50%  respiratory variability, suggesting right atrial pressure of 3 mmHg.   Comparison(s): Prior images reviewed side by side. The left ventricular  function has improved.   FINDINGS   Left Ventricle: Left ventricular ejection fraction, by estimation, is 50  to 55%. Left ventricular ejection fraction by 3D volume is 52 %. The left  ventricle has low normal function. The left ventricle has no regional wall  motion abnormalities. The left  ventricular internal cavity size was normal in size. There is no left  ventricular hypertrophy. Left ventricular diastolic parameters are  consistent with Grade I diastolic dysfunction (impaired relaxation).   Right Ventricle: The right ventricular size is normal. No increase in  right ventricular wall thickness. Right ventricular systolic function is  normal.   Left Atrium: Left atrial size was normal in size.   Right Atrium: Right atrial size was normal in size.   Pericardium: There is no evidence of pericardial effusion.   Mitral Valve: The mitral valve is normal in structure. Mild mitral valve  regurgitation. No evidence of mitral valve stenosis.   Tricuspid Valve: The tricuspid valve is normal in structure. Tricuspid  valve regurgitation is not demonstrated. No evidence of tricuspid  stenosis.   Aortic Valve: The aortic valve is tricuspid. Aortic valve regurgitation is  not visualized. No aortic stenosis is present.   Pulmonic Valve: The pulmonic valve was normal in structure. Pulmonic valve  regurgitation is not visualized. No evidence of pulmonic stenosis.   Aorta: Aortic dilatation noted. There is mild dilatation of the aortic  root, measuring 40 mm.   Venous: The inferior vena cava is normal in size with greater than  50%  respiratory variability, suggesting right atrial  pressure of 3 mmHg.   IAS/Shunts: No atrial level shunt detected by color flow Doppler.      Assessment & Plan   1.  Systolic CHF/nonischemic cardiomyopathy-echocardiogram 8/24 showed EF of 50-55%, G1 DD, and no significant valvular abnormalities.  Details above.  Recently in the emergency department with hypotension.  His Entresto was stopped at that time.  BP today 100/44. Continue  spironolactone, carvedilol Daily weights Elevate lower extremities when not active  Anemia-hemoglobin noted to be 8.9 on 10/07/2022.  Reports increased fatigue, shortness of breath, and chest discomfort with increased physical activity intermittently.  Recent colonoscopy reassuring. Order CBC, BMP Increase caloric diet Iron rich food list given Based off of lab work we will plan for/recommend following up with GI for endoscopy  Essential hypertension-BP today 100/44. Maintain blood pressure log Continue carvedilol, spironolactone, Entresto  Hyperlipidemia-follows with pcp High-fiber diet Continue aspirin Maintain physical activity  Palpitations-reports occasional brief palpitations. EKG today shows sinus tachycardia 106 bpm.  Elevated heart rate secondary to low hemoglobin. Continue carvedilol Avoid triggers caffeine, chocolate, EtOH, dehydration etc.   Disposition: Follow-up with Dr.Berry or me in 1-2 months.   Thomasene Ripple. Sheyli Horwitz NP-C     10/22/2022, 11:43 AM  [facility] [address] Office [phone] Fax [phone]    I spent 15 minutes examining this patient, reviewing medications, and using patient centered shared decision making involving her cardiac care.  Prior to her visit I spent greater than 20 minutes reviewing her past medical history,  medications, and prior cardiac tests.

## 2022-10-22 ENCOUNTER — Encounter: Payer: Self-pay | Admitting: General Practice

## 2022-10-22 ENCOUNTER — Encounter: Payer: Self-pay | Admitting: Hematology

## 2022-10-22 ENCOUNTER — Ambulatory Visit: Payer: Medicare Other | Attending: General Practice | Admitting: General Practice

## 2022-10-22 VITALS — BP 100/44 | HR 106 | Ht 72.0 in | Wt 189.0 lb

## 2022-10-22 DIAGNOSIS — E7800 Pure hypercholesterolemia, unspecified: Secondary | ICD-10-CM | POA: Diagnosis not present

## 2022-10-22 DIAGNOSIS — R002 Palpitations: Secondary | ICD-10-CM

## 2022-10-22 DIAGNOSIS — I5020 Unspecified systolic (congestive) heart failure: Secondary | ICD-10-CM

## 2022-10-22 DIAGNOSIS — I10 Essential (primary) hypertension: Secondary | ICD-10-CM

## 2022-10-22 DIAGNOSIS — D508 Other iron deficiency anemias: Secondary | ICD-10-CM | POA: Diagnosis not present

## 2022-10-22 NOTE — Patient Instructions (Signed)
Medication Instructions:  The current medical regimen is effective;  continue present plan and medications as directed. Please refer to the Current Medication list given to you today.  *If you need a refill on your cardiac medications before your next appointment, please call your pharmacy*  Lab Work: CBC AND BMET TODAY  If you have labs (blood work) drawn today and your tests are completely normal, you will receive your results only by:  MyChart Message (if you have MyChart) OR  A paper copy in the mail If you have any lab test that is abnormal or we need to change your treatment, we will call you to review the results.  Other Instructions INCREASE HYDRATION INCREASE IRON FOODS(SEE BELOW)-START BLENDING THESE IRON RICH FOODS NO LOW FAT FOODS, ETC. MAKE SURE YOU ARE USING FULL FAT  Follow-Up: At [facility], you and your health needs are our priority.  As part of our continuing mission to provide you with exceptional heart care, we have created designated Provider Care Teams.  These Care Teams include your primary Cardiologist (physician) and Advanced Practice Providers (APPs -  Physician Assistants and Nurse Practitioners) who all work together to provide you with the care you need, when you need it.  Your next appointment:   KEEP SCHEDULED APPOINTMENT   Provider:   Nanetta Batty, MD      Iron-Rich Diet  Iron is a mineral that helps your body produce hemoglobin. Hemoglobin is a protein in red blood cells that carries oxygen to your body's tissues. Eating too little iron may cause you to feel weak and tired, and it can increase your risk of infection. Iron is naturally found in many foods, and many foods have iron added to them (are iron-fortified). You may need to follow an iron-rich diet if you do not have enough iron in your body due to certain medical conditions. The amount of iron that you need each day depends on your age, your sex, and any medical conditions you have.  Follow instructions from your health care provider or a dietitian about how much iron you should eat each day. What are tips for following this plan? Reading food labels Check food labels to see how many milligrams (mg) of iron are in each serving. Cooking Cook foods in pots and pans that are made from iron. Take these steps to make it easier for your body to absorb iron from certain foods: Soak beans overnight before cooking. Soak whole grains overnight and drain them before using. Ferment flours before baking, such as by using yeast in bread dough. Meal planning When you eat foods that contain iron, you should eat them with foods that are high in vitamin C. These include oranges, peppers, tomatoes, potatoes, and mangoes. Vitamin C helps your body absorb iron. Certain foods and drinks prevent your body from absorbing iron properly. Avoid eating these foods in the same meal as iron-rich foods or with iron supplements. These foods include: Coffee, black tea, and red wine. Milk, dairy products, and foods that are high in calcium. Beans and soybeans. Whole grains. General information Take iron supplements only as told by your health care provider. An overdose of iron can be life-threatening. If you were prescribed iron supplements, take them with orange juice or a vitamin C supplement. When you eat iron-fortified foods or take an iron supplement, you should also eat foods that naturally contain iron, such as meat, poultry, and fish. Eating naturally iron-rich foods helps your body absorb the iron that  is added to other foods or contained in a supplement. Iron from animal sources is better absorbed than iron from plant sources. What foods should I eat? Fruits Prunes. Raisins. Eat fruits high in vitamin C, such as oranges, grapefruits, and strawberries, with iron-rich foods. Vegetables Spinach (cooked). Green peas. Broccoli. Fermented vegetables. Eat vegetables high in vitamin C, such as leafy  greens, potatoes, bell peppers, and tomatoes, with iron-rich foods. Grains Iron-fortified breakfast cereal. Iron-fortified whole-wheat bread. Enriched rice. Sprouted grains. Meats and other proteins Beef liver. Beef. [place]. Chicken. Oysters. Shrimp. Tuna. Sardines. Chickpeas. Nuts. Tofu. Pumpkin seeds. Beverages Tomato juice. Fresh orange juice. Prune juice. Hibiscus tea. Iron-fortified instant breakfast shakes. Sweets and desserts Blackstrap molasses. Seasonings and condiments Tahini. Fermented soy sauce. Other foods Wheat germ. The items listed above may not be a complete list of recommended foods and beverages. Contact a dietitian for more information. What foods should I limit? These are foods that should be limited while eating iron-rich foods as they can reduce the absorption of iron in your body. Grains Whole grains. Bran cereal. Bran flour. Meats and other proteins Soybeans. Products made from soy protein. Black beans. Lentils. Mung beans. Split peas. Dairy Milk. Cream. Cheese. Yogurt. Cottage cheese. Beverages Coffee. Black tea. Red wine. Sweets and desserts Cocoa. Chocolate. Ice cream. Seasonings and condiments Basil. Oregano. Large amounts of parsley. The items listed above may not be a complete list of foods and beverages you should limit. Contact a dietitian for more information. Summary Iron is a mineral that helps your body produce hemoglobin. Hemoglobin is a protein in red blood cells that carries oxygen to your body's tissues. Iron is naturally found in many foods, and many foods have iron added to them (are iron-fortified). When you eat foods that contain iron, you should eat them with foods that are high in vitamin C. Vitamin C helps your body absorb iron. Certain foods and drinks prevent your body from absorbing iron properly, such as whole grains and dairy products. You should avoid eating these foods in the same meal as iron-rich foods or with iron  supplements. This information is not intended to replace advice given to you by your health care provider. Make sure you discuss any questions you have with your health care provider. Document Revised: 01/02/2020 Document Reviewed: 01/02/2020 Elsevier Patient Education  2024 [employer].

## 2022-10-23 ENCOUNTER — Encounter: Payer: Self-pay | Admitting: Hematology

## 2022-10-23 LAB — CBC
Hematocrit: 24.3 % — ABNORMAL LOW (ref 37.5–51.0)
Hemoglobin: 8 g/dL — ABNORMAL LOW (ref 13.0–17.7)
MCH: 32.1 pg (ref 26.6–33.0)
MCHC: 32.9 g/dL (ref 31.5–35.7)
MCV: 98 fL — ABNORMAL HIGH (ref 79–97)
Platelets: 297 10*3/uL (ref 150–450)
RBC: 2.49 x10E6/uL — CL (ref 4.14–5.80)
RDW: 12.9 % (ref 11.6–15.4)
WBC: 10.7 10*3/uL (ref 3.4–10.8)

## 2022-10-23 LAB — BASIC METABOLIC PANEL
BUN/Creatinine Ratio: 16 (ref 10–24)
BUN: 23 mg/dL (ref 8–27)
CO2: 20 mmol/L (ref 20–29)
Calcium: 10.3 mg/dL — ABNORMAL HIGH (ref 8.6–10.2)
Chloride: 102 mmol/L (ref 96–106)
Creatinine, Ser: 1.44 mg/dL — ABNORMAL HIGH (ref 0.76–1.27)
Glucose: 114 mg/dL — ABNORMAL HIGH (ref 70–99)
Potassium: 4.9 mmol/L (ref 3.5–5.2)
Sodium: 140 mmol/L (ref 134–144)
eGFR: 51 mL/min/{1.73_m2} — ABNORMAL LOW (ref >59)

## 2022-10-24 ENCOUNTER — Other Ambulatory Visit: Payer: Self-pay | Admitting: Hematology

## 2022-10-24 ENCOUNTER — Other Ambulatory Visit: Payer: Self-pay

## 2022-10-24 ENCOUNTER — Inpatient Hospital Stay: Payer: Medicare Other

## 2022-10-24 ENCOUNTER — Inpatient Hospital Stay: Payer: Medicare Other | Admitting: Physician Assistant

## 2022-10-24 ENCOUNTER — Other Ambulatory Visit: Payer: Self-pay | Admitting: Cardiovascular Disease

## 2022-10-24 ENCOUNTER — Encounter (HOSPITAL_COMMUNITY)
Admission: RE | Admit: 2022-10-24 | Discharge: 2022-10-24 | Disposition: A | Discharge status: Home / Self Care | Payer: Medicare Other | Source: Ambulatory Visit | Attending: Hematology | Admitting: Hematology

## 2022-10-24 DIAGNOSIS — C9000 Multiple myeloma not having achieved remission: Secondary | ICD-10-CM

## 2022-10-24 DIAGNOSIS — D649 Anemia, unspecified: Secondary | ICD-10-CM

## 2022-10-24 LAB — PREPARE RBC (CROSSMATCH)

## 2022-10-24 LAB — CBC WITH DIFFERENTIAL (CANCER CENTER ONLY)
Abs Immature Granulocytes: 0.3 10*3/uL — ABNORMAL HIGH (ref 0.00–0.07)
Basophils Absolute: 0 10*3/uL (ref 0.0–0.1)
Basophils Relative: 0 %
Eosinophils Absolute: 0 10*3/uL (ref 0.0–0.5)
Eosinophils Relative: 0 %
HCT: 22.3 % — ABNORMAL LOW (ref 39.0–52.0)
Hemoglobin: 7.4 g/dL — ABNORMAL LOW (ref 13.0–17.0)
Lymphocytes Relative: 11 %
Lymphs Abs: 1.1 10*3/uL (ref 0.7–4.0)
MCH: 32.2 pg (ref 26.0–34.0)
MCHC: 33.2 g/dL (ref 30.0–36.0)
MCV: 97 fL (ref 80.0–100.0)
Metamyelocytes Relative: 2 %
Monocytes Absolute: 0.8 10*3/uL (ref 0.1–1.0)
Monocytes Relative: 8 %
Myelocytes: 1 %
Neutro Abs: 7.4 10*3/uL (ref 1.7–7.7)
Neutrophils Relative %: 72 %
Other: 6 %
Platelet Count: 277 10*3/uL (ref 150–400)
RBC: 2.3 MIL/uL — ABNORMAL LOW (ref 4.22–5.81)
RDW: 14.1 % (ref 11.5–15.5)
Smear Review: NORMAL
WBC Count: 10.3 10*3/uL (ref 4.0–10.5)
WBC Morphology: ABNORMAL
nRBC: 0.2 % (ref 0.0–0.2)

## 2022-10-24 LAB — CMP (CANCER CENTER ONLY)
ALT: 35 U/L (ref 0–44)
AST: 22 U/L (ref 15–41)
Albumin: 4.1 g/dL (ref 3.5–5.0)
Alkaline Phosphatase: 146 U/L — ABNORMAL HIGH (ref 38–126)
Anion gap: 13 (ref 5–15)
BUN: 27 mg/dL — ABNORMAL HIGH (ref 8–23)
CO2: 21 mmol/L — ABNORMAL LOW (ref 22–32)
Calcium: 10.2 mg/dL (ref 8.9–10.3)
Chloride: 104 mmol/L (ref 98–111)
Creatinine: 1.27 mg/dL — ABNORMAL HIGH (ref 0.61–1.24)
GFR, Estimated: 60 mL/min — ABNORMAL LOW (ref >60)
Glucose, Bld: 122 mg/dL — ABNORMAL HIGH (ref 70–99)
Potassium: 4 mmol/L (ref 3.5–5.1)
Sodium: 138 mmol/L (ref 135–145)
Total Bilirubin: 1.4 mg/dL — ABNORMAL HIGH (ref 0.3–1.2)
Total Protein: 6.9 g/dL (ref 6.5–8.1)

## 2022-10-24 LAB — IRON AND IRON BINDING CAPACITY (CC-WL,HP ONLY)
Iron: 57 ug/dL (ref 45–182)
Saturation Ratios: 20 % (ref 17.9–39.5)
TIBC: 281 ug/dL (ref 250–450)
UIBC: 224 ug/dL (ref 117–376)

## 2022-10-24 LAB — LACTATE DEHYDROGENASE: LDH: 148 U/L (ref 98–192)

## 2022-10-24 LAB — TSH: TSH: 1.304 u[IU]/mL (ref 0.350–4.500)

## 2022-10-24 LAB — DIRECT ANTIGLOBULIN TEST (NOT AT ARMC)
DAT, IgG: NEGATIVE
DAT, complement: NEGATIVE

## 2022-10-24 LAB — RETIC PANEL
Immature Retic Fract: 28.5 % — ABNORMAL HIGH (ref 2.3–15.9)
RBC.: 2.17 MIL/uL — ABNORMAL LOW (ref 4.22–5.81)
Retic Count, Absolute: 21.9 10*3/uL (ref 19.0–186.0)
Retic Ct Pct: 1 % (ref 0.4–3.1)
Reticulocyte Hemoglobin: 34.8 pg (ref >27.9)

## 2022-10-24 LAB — PATHOLOGIST SMEAR REVIEW

## 2022-10-24 LAB — FERRITIN: Ferritin: 824 ng/mL — ABNORMAL HIGH (ref 24–336)

## 2022-10-24 LAB — VITAMIN B12: Vitamin B-12: 478 pg/mL (ref 180–914)

## 2022-10-24 LAB — T4, FREE: Free T4: 1.29 ng/dL — ABNORMAL HIGH (ref 0.61–1.12)

## 2022-10-24 LAB — GLUCOSE, CAPILLARY: Glucose-Capillary: 132 mg/dL — ABNORMAL HIGH (ref 70–99)

## 2022-10-24 MED ORDER — ACETAMINOPHEN 325 MG PO TABS
650.0000 mg | ORAL_TABLET | Freq: Once | ORAL | Status: AC
  Administered 2022-10-24: 650 mg via ORAL
  Filled 2022-10-24: qty 2

## 2022-10-24 MED ORDER — ACETAMINOPHEN 325 MG PO TABS
325.0000 mg | ORAL_TABLET | Freq: Once | ORAL | Status: DC
  Filled 2022-10-24: qty 1

## 2022-10-24 MED ORDER — METHYLPREDNISOLONE SODIUM SUCC 40 MG IJ SOLR
40.0000 mg | Freq: Once | INTRAMUSCULAR | Status: AC
  Administered 2022-10-24: 40 mg via INTRAVENOUS
  Filled 2022-10-24: qty 1

## 2022-10-24 MED ORDER — FLUDEOXYGLUCOSE F - 18 (FDG) INJECTION
9.3000 | Freq: Once | INTRAVENOUS | Status: AC | PRN
  Administered 2022-10-24: 9.4 via INTRAVENOUS

## 2022-10-24 MED ORDER — SODIUM CHLORIDE 0.9% IV SOLUTION
250.0000 mL | Freq: Once | INTRAVENOUS | Status: AC
  Administered 2022-10-24: 250 mL via INTRAVENOUS

## 2022-10-24 NOTE — Progress Notes (Signed)
Per Dr Candise Che, NO treatment today. Will give 1 unit of blood today and bring patient back tomorrow for another transfusion.

## 2022-10-24 NOTE — Patient Instructions (Signed)

## 2022-10-25 ENCOUNTER — Other Ambulatory Visit: Payer: Self-pay

## 2022-10-25 ENCOUNTER — Inpatient Hospital Stay: Payer: Medicare Other

## 2022-10-25 DIAGNOSIS — C9000 Multiple myeloma not having achieved remission: Secondary | ICD-10-CM | POA: Diagnosis not present

## 2022-10-25 DIAGNOSIS — Z9484 Stem cells transplant status: Secondary | ICD-10-CM | POA: Diagnosis not present

## 2022-10-25 MED ORDER — SODIUM CHLORIDE 0.9% IV SOLUTION
250.0000 mL | Freq: Once | INTRAVENOUS | Status: AC
  Administered 2022-10-25: 250 mL via INTRAVENOUS

## 2022-10-25 MED ORDER — METHYLPREDNISOLONE SODIUM SUCC 40 MG IJ SOLR
40.0000 mg | Freq: Once | INTRAMUSCULAR | Status: AC
  Administered 2022-10-25: 40 mg via INTRAVENOUS
  Filled 2022-10-25: qty 1

## 2022-10-25 MED ORDER — ACETAMINOPHEN 325 MG PO TABS
650.0000 mg | ORAL_TABLET | Freq: Once | ORAL | Status: AC
  Administered 2022-10-25: 650 mg via ORAL
  Filled 2022-10-25: qty 2

## 2022-10-25 MED ORDER — SODIUM CHLORIDE 0.9% FLUSH: 10.0000 mL | INTRAVENOUS | Status: DC | PRN

## 2022-10-25 MED ORDER — HEPARIN SOD (PORK) LOCK FLUSH 100 UNIT/ML IV SOLN: 250.0000 [IU] | INTRAVENOUS | Status: DC | PRN

## 2022-10-25 MED ORDER — SODIUM CHLORIDE 0.9% FLUSH: 3.0000 mL | INTRAVENOUS | Status: DC | PRN

## 2022-10-25 NOTE — Patient Instructions (Signed)

## 2022-10-26 LAB — HAPTOGLOBIN: Haptoglobin: 521 mg/dL — ABNORMAL HIGH (ref 34–355)

## 2022-10-27 ENCOUNTER — Other Ambulatory Visit (HOSPITAL_COMMUNITY): Payer: Self-pay | Admitting: Student

## 2022-10-27 DIAGNOSIS — D72829 Elevated white blood cell count, unspecified: Secondary | ICD-10-CM

## 2022-10-27 LAB — BPAM RBC
Blood Product Expiration Date: 202410052359
Blood Product Expiration Date: 202410242359
ISSUE DATE / TIME: 202409201337
ISSUE DATE / TIME: 202409210842
Unit Type and Rh: 9500
Unit Type and Rh: 9500

## 2022-10-27 LAB — KAPPA/LAMBDA LIGHT CHAINS
Kappa free light chain: 53.2 mg/L — ABNORMAL HIGH (ref 3.3–19.4)
Kappa, lambda light chain ratio: 19.7 — ABNORMAL HIGH (ref 0.26–1.65)
Lambda free light chains: 2.7 mg/L — ABNORMAL LOW (ref 5.7–26.3)

## 2022-10-27 LAB — TYPE AND SCREEN
ABO/RH(D): O POS
Antibody Screen: POSITIVE
Donor AG Type: NEGATIVE
Donor AG Type: NEGATIVE
Unit division: 0
Unit division: 0

## 2022-10-27 LAB — FOLATE RBC
Folate, Hemolysate: 215 ng/mL
Folate, RBC: 1029 ng/mL (ref >498)
Hematocrit: 20.9 % — ABNORMAL LOW (ref 37.5–51.0)

## 2022-10-28 ENCOUNTER — Encounter: Payer: Self-pay | Admitting: Gastroenterology

## 2022-10-28 ENCOUNTER — Other Ambulatory Visit (INDEPENDENT_AMBULATORY_CARE_PROVIDER_SITE_OTHER): Payer: Medicare Other

## 2022-10-28 ENCOUNTER — Ambulatory Visit (INDEPENDENT_AMBULATORY_CARE_PROVIDER_SITE_OTHER): Payer: Medicare Other | Admitting: Gastroenterology

## 2022-10-28 VITALS — BP 112/60 | HR 111 | Ht 72.0 in | Wt 184.0 lb

## 2022-10-28 DIAGNOSIS — D649 Anemia, unspecified: Secondary | ICD-10-CM

## 2022-10-28 LAB — COMPREHENSIVE METABOLIC PANEL
ALT: 33 U/L (ref 0–53)
AST: 17 U/L (ref 0–37)
Albumin: 3.9 g/dL (ref 3.5–5.2)
Alkaline Phosphatase: 139 U/L — ABNORMAL HIGH (ref 39–117)
BUN: 24 mg/dL — ABNORMAL HIGH (ref 6–23)
CO2: 23 mEq/L (ref 19–32)
Calcium: 9.9 mg/dL (ref 8.4–10.5)
Chloride: 105 mEq/L (ref 96–112)
Creatinine, Ser: 1.22 mg/dL (ref 0.40–1.50)
GFR: 58.94 mL/min — ABNORMAL LOW (ref >60.00)
Glucose, Bld: 128 mg/dL — ABNORMAL HIGH (ref 70–99)
Potassium: 4.3 mEq/L (ref 3.5–5.1)
Sodium: 140 mEq/L (ref 135–145)
Total Bilirubin: 1.4 mg/dL — ABNORMAL HIGH (ref 0.2–1.2)
Total Protein: 5.9 g/dL — ABNORMAL LOW (ref 6.0–8.3)

## 2022-10-28 LAB — CBC WITH DIFFERENTIAL/PLATELET
Basophils Absolute: 0 10*3/uL (ref 0.0–0.1)
Basophils Relative: 0 % (ref 0.0–3.0)
Eosinophils Absolute: 0.1 10*3/uL (ref 0.0–0.7)
Eosinophils Relative: 1.3 % (ref 0.0–5.0)
HCT: 26.7 % — ABNORMAL LOW (ref 39.0–52.0)
Hemoglobin: 8.9 g/dL — ABNORMAL LOW (ref 13.0–17.0)
Lymphocytes Relative: 3.8 % — ABNORMAL LOW (ref 12.0–46.0)
Lymphs Abs: 0.4 10*3/uL — ABNORMAL LOW (ref 0.7–4.0)
MCHC: 33.3 g/dL (ref 30.0–36.0)
MCV: 93.1 fl (ref 78.0–100.0)
Monocytes Absolute: 1.5 10*3/uL — ABNORMAL HIGH (ref 0.1–1.0)
Monocytes Relative: 14.8 % — ABNORMAL HIGH (ref 3.0–12.0)
Neutro Abs: 8.2 10*3/uL — ABNORMAL HIGH (ref 1.4–7.7)
Neutrophils Relative %: 80.1 % — ABNORMAL HIGH (ref 43.0–77.0)
Platelets: 284 10*3/uL (ref 150.0–400.0)
RBC: 2.87 Mil/uL — ABNORMAL LOW (ref 4.22–5.81)
RDW: 16.6 % — ABNORMAL HIGH (ref 11.5–15.5)
WBC: 10.2 10*3/uL (ref 4.0–10.5)

## 2022-10-28 LAB — IBC + FERRITIN
Ferritin: 952.3 ng/mL — ABNORMAL HIGH (ref 22.0–322.0)
Iron: 74 ug/dL (ref 42–165)
Saturation Ratios: 27 % (ref 20.0–50.0)
TIBC: 274.4 ug/dL (ref 250.0–450.0)
Transferrin: 196 mg/dL — ABNORMAL LOW (ref 212.0–360.0)

## 2022-10-28 NOTE — Progress Notes (Signed)
Chief Complaint: Anemia Primary GI MD: Dr. Russella Dar  HPI: 72 year old male history of multiple myeloma (s/p chemo, radiation, stem cell transplant), HFpEF, presents for evaluation of anemia.  Recently seen at [facility], ED 10/07/2022 for near syncope.  Patient was found to be hypotensive and treated with IV hydration. At that time it was found he had worsening anemia of 8.9 (baseline 12).  Appears patient has had slow drift in hemoglobin over the last 8 months.  Most recent lab work 10/24/2022 - Hgb 7.4, MCV 97.0 - Iron 57, TIBC 281, saturation 20% - Ferritin 824 - B12 478 - BUN 27, creatinine 1.27, GFR 60 - Alk phos 146, total bili 1.4  Patient states he had 1 unit of blood 9/20 and a second unit of blood 9/21 and has not had a check since.  Patient denies overt bleeding.  Denies change in bowel habits.  Denies nausea/vomiting.  He does note a decrease in appetite over the last few weeks and has dropped about 15 pounds over the last month.  Today he is in a wheelchair.  His wife states this is new for him as of this morning.  He typically walks with a cane.  Patient states he feels weak and somewhat dizzy.  He has eaten a hot dog over the last 48 hours per his wife.  Patient denies having appetite and has no interest in the thought of food.  Denies chest pain, shortness of breath.   PREVIOUS GI WORKUP   Colonoscopy 07/2022 for history of polyps - 6 mm polyp (tubular adenoma) in cecum.  Resected and retrieved. 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 90 tablet 3   aspirin EC 81 MG tablet Take 81 mg by mouth daily.     B Complex Vitamins (B-COMPLEX/B-12) TABS Take 1 tablet by mouth daily.     bisacodyl (DULCOLAX) 5 MG EC tablet Take 5 mg by mouth daily as needed for moderate constipation.     carboxymethylcellulose (REFRESH PLUS) 0.5 % SOLN Apply 2 drops to eye daily as needed (as needed).     carvedilol (COREG) 12.5 MG tablet TAKE 1 TABLET(12.5 MG) BY MOUTH TWICE DAILY 180 tablet 3   cholecalciferol (VITAMIN D3) 25 MCG (1000 UNIT) tablet Take 1,000 Units by mouth daily.     diphenhydramine-acetaminophen (TYLENOL PM EXTRA STRENGTH) 25-500 MG TABS tablet Take 1 tablet by mouth at bedtime as needed (pain).  ENTRESTO 24-26 MG TAKE 1 TABLET BY MOUTH TWICE DAILY 60 tablet 3   gabapentin (NEURONTIN) 300 MG capsule TAKE 3 CAPSULES BY MOUTH THREE TIMES DAILY IN THE MORNING AND IN THE AFTERNOON AND AT NIGHT 270 capsule 3   Multiple Vitamin (MULTIVITAMIN) capsule Take 1 capsule by mouth daily.     sildenafil (VIAGRA) 50 MG tablet daily as needed.     sodium chloride (OCEAN) 0.65 % SOLN nasal spray Place 1 spray into both nostrils daily as needed for congestion.     spironolactone (ALDACTONE) 25 MG tablet Take 1 tablet (25 mg total) by mouth daily. 90 tablet 3   No current facility-administered medications for this visit.    Allergies as of 10/28/2022 - Review Complete 10/28/2022  Allergen Reaction Noted   Jardiance  [empagliflozin] Other (See Comments) 08/28/2021    Family History  Problem Relation Age of Onset   Hypertension Mother    Cancer Mother    Lung cancer Mother    Hypertension Father    Colon cancer Neg Hx    Colon polyps Neg Hx    Esophageal cancer Neg Hx    Rectal cancer Neg Hx    Stomach cancer Neg Hx     Social History   Socioeconomic History   Marital status: Married    Spouse name: Not on file   Number of children: Not on file   Years of education: Not on file   Highest education level: Not on file  Occupational History   Not on file  Tobacco Use   Smoking status: Never   Smokeless tobacco: Never  Vaping Use   Vaping status: Never Used  Substance and Sexual Activity   Alcohol use: Yes    Comment: occ   Drug use: Never   Sexual activity: Not on file  Other Topics Concern   Not on file  Social History Narrative   He worked as a Sports coach level of education:  college   He is back in college at [employer] studying exercise physiology.       Right handed      Single story home   Social Determinants of Health   Financial Resource Strain: Not on file  Food Insecurity: No Food Insecurity (09/11/2021)   Hunger Vital Sign    Worried About Running Out of Food in the Last Year: Never true    Ran Out of Food in the Last Year: Never true  Transportation Needs: No Transportation Needs (09/11/2021)   PRAPARE - Administrator, Civil Service (Medical): No    Lack of Transportation (Non-Medical): No  Physical Activity: Not on file  Stress: Not on file  Social Connections: Not on file  Intimate Partner Violence: Not on file    Review of Systems:    Constitutional: No weight loss, fever, chills, weakness or fatigue HEENT: Eyes: No change in vision               Ears, Nose, Throat:  No change in hearing or congestion Skin: No rash or itching Cardiovascular: No chest pain, chest pressure or palpitations   Respiratory: No SOB or  cough Gastrointestinal: See HPI and otherwise negative Genitourinary: No dysuria or change in urinary frequency Neurological: No headache, dizziness or syncope Musculoskeletal: No new muscle or joint pain Hematologic: No bleeding or bruising Psychiatric: No history of depression or anxiety    Physical Exam:  Vital signs: Ht 6' (1.829 m)   Wt 83.5 kg  BMI 24.95 kg/m   Constitutional: Ill-appearing, pale appearing, temporal wasting, thin frail male sitting in wheelchair  head:  Normocephalic and atraumatic. Eyes:   Pale conjunctiva, no icterus Respiratory: Respirations even and unlabored. Lungs clear to auscultation bilaterally.   No wheezes, crackles, or rhonchi.  Cardiovascular: Slightly tachycardic, regular rhythm. No peripheral edema, cyanosis or pallor.  Gastrointestinal:  Soft, nondistended, nontender. No rebound or guarding. Normal bowel sounds. No appreciable masses or hepatomegaly. Rectal:  Not performed.  Msk:  Symmetrical without gross deformities. Without edema, no deformity or joint abnormality.  Neurologic:  Alert and  oriented x4;  grossly normal neurologically.  Skin:   Dry and intact without significant lesions or rashes. Psychiatric: Oriented to person, place and time. Demonstrates good judgement and reason without abnormal affect or behaviors.   RELEVANT LABS AND IMAGING: CBC    Component Value Date/Time   WBC 10.3 10/24/2022 0853   WBC 7.8 10/07/2022 1215   RBC 2.17 (L) 10/24/2022 1014   RBC 2.30 (L) 10/24/2022 0853   HGB 7.4 (L) 10/24/2022 0853   HGB 8.0 (L) 10/22/2022 0000   HGB 12.0 (L) 02/06/2017 0828   HCT 20.9 (L) 10/24/2022 1015   HCT 22.3 (L) 10/24/2022 0853   HCT 36.2 (L) 02/06/2017 0828   PLT 277 10/24/2022 0853   PLT 297 10/22/2022 0000   MCV 97.0 10/24/2022 0853   MCV 98 (H) 10/22/2022 0000   MCV 97.8 02/06/2017 0828   MCH 32.2 10/24/2022 0853   MCHC 33.2 10/24/2022 0853   RDW 14.1 10/24/2022 0853   RDW 12.9 10/22/2022 0000   RDW 15.1  (H) 02/06/2017 0828   LYMPHSABS 1.1 10/24/2022 0853   LYMPHSABS 0.9 02/06/2017 0828   MONOABS 0.8 10/24/2022 0853   MONOABS 0.6 02/06/2017 0828   EOSABS 0.0 10/24/2022 0853   EOSABS 0.2 02/06/2017 0828   BASOSABS 0.0 10/24/2022 0853   BASOSABS 0.0 02/06/2017 0828    CMP     Component Value Date/Time   NA 138 10/24/2022 0853   NA 140 10/22/2022 0000   NA 137 02/06/2017 0828   K 4.0 10/24/2022 0853   K 4.0 02/06/2017 0828   CL 104 10/24/2022 0853   CO2 21 (L) 10/24/2022 0853   CO2 24 02/06/2017 0828   GLUCOSE 122 (H) 10/24/2022 0853   GLUCOSE 111 02/06/2017 0828   BUN 27 (H) 10/24/2022 0853   BUN 23 10/22/2022 0000   BUN 14.9 02/06/2017 0828   CREATININE 1.27 (H) 10/24/2022 0853   CREATININE 0.9 02/06/2017 0828   CALCIUM 10.2 10/24/2022 0853   CALCIUM 9.1 02/06/2017 0828   PROT 6.9 10/24/2022 0853   PROT 6.1 (L) 02/06/2017 0828   ALBUMIN 4.1 10/24/2022 0853   ALBUMIN 3.7 02/06/2017 0828   AST 22 10/24/2022 0853   AST 32 02/06/2017 0828   ALT 35 10/24/2022 0853   ALT 43 02/06/2017 0828   ALKPHOS 146 (H) 10/24/2022 0853   ALKPHOS 31 (L) 02/06/2017 0828   BILITOT 1.4 (H) 10/24/2022 0853   BILITOT 0.42 02/06/2017 0828   GFRNONAA 60 (L) 10/24/2022 0853   GFRAA >60 08/17/2019 0843     Assessment/Plan:   72 year old male history of multiple myeloma presenting today with anemia (Hgb 7.4 9/20) slow drift over the last 8 months with Hgb of 12 3 months ago and received 1 unit PRBC 9/20 and another unit PRBC 9/21.  No overt bleeding.  No appetite.  Significant weight loss.  Having weakness.  Anemia Discussed with patient and wife  it is in his best interest to go to the ED for further evaluation with his weakness this morning resulting in him being in a wheelchair.  With his appearance today I suspect the lab work we get will show that he has a severe anemia requiring repeat transfusion and at that time I would definitely recommend ED.  Patient agrees to go to ED if labs require  transfusion.  - CBC, CMP, iron studies - Scheduled for outpatient EGD 9/26 if able to proceed with outpatient workup.  However, recommend inpatient workup due to significant weakness/pale conjunctiva. - With his heart history it is best for hemoglobin to remain above 8.  If CBC shows hemoglobin less than 8 will recommend proceed to ED for further evaluation.[name] I have notified inpatient team  Aliveah Gallant Jolee Ewing [facility] 10/28/2022, 10:12 AM  Cc: Jackie Plum, MD

## 2022-10-28 NOTE — Progress Notes (Signed)
Patient for IR Bone Marrow Biopsy on Wed 10/29/22, I called and spoke with the patient on the phone and gave pre-procedure instructions. Pt was made aware to be here at 8:30a, NPO after MN prior to procedure as well as driver post procedure/recovery/discharge. Pt stated understanding.  Called 10/29/22

## 2022-10-28 NOTE — Patient Instructions (Signed)
_______________________________________________________  If your blood pressure at your visit was 140/90 or greater, please contact your primary care physician to follow up on this.  _______________________________________________________  If you are age 73 or older, your body mass index should be between 23-30. Your Body mass index is 24.95 kg/m. If this is out of the aforementioned range listed, please consider follow up with your Primary Care Provider.  If you are age 70 or younger, your body mass index should be between 19-25. Your Body mass index is 24.95 kg/m. If this is out of the aformentioned range listed, please consider follow up with your Primary Care Provider.   ________________________________________________________  The [place] GI providers would like to encourage you to use [facility] to communicate with providers for non-urgent requests or questions.  Due to long hold times on the telephone, sending your provider a message by [facility] may be a faster and more efficient way to get a response.  Please allow 48 business hours for a response.  Please remember that this is for non-urgent requests.  _______________________________________________________  Alexander Saunders have been scheduled for an endoscopy. Please follow written instructions given to you at your visit today.  If you use inhalers (even only as needed), please bring them with you on the day of your procedure.  If you take any of the following medications, they will need to be adjusted prior to your procedure:   DO NOT TAKE 7 DAYS PRIOR TO TEST- Trulicity (dulaglutide) Ozempic, Wegovy (semaglutide) Mounjaro (tirzepatide) Bydureon Bcise (exanatide extended release)  DO NOT TAKE 1 DAY PRIOR TO YOUR TEST Rybelsus (semaglutide) Adlyxin (lixisenatide) Victoza (liraglutide) Byetta (exanatide) ___________________________________________________________________________  Your provider has requested that you go to the basement  level for lab work before leaving today. Press "B" on the elevator. The lab is located at the first door on the left as you exit the elevator.  Due to recent changes in healthcare laws, you may see the results of your imaging and laboratory studies on MyChart before your provider has had a chance to review them.  We understand that in some cases there may be results that are confusing or concerning to you. Not all laboratory results come back in the same time frame and the provider may be waiting for multiple results in order to interpret others.  Please give [place] 48 hours in order for your provider to thoroughly review all the results before contacting the office for clarification of your results.    It was a pleasure to see you today!  Thank you for trusting me with your gastrointestinal care!

## 2022-10-29 ENCOUNTER — Ambulatory Visit
Admission: RE | Admit: 2022-10-29 | Discharge: 2022-10-29 | Disposition: A | Discharge status: Home / Self Care | Payer: Medicare Other | Source: Ambulatory Visit | Attending: Hematology | Admitting: Hematology

## 2022-10-29 ENCOUNTER — Other Ambulatory Visit: Payer: Self-pay

## 2022-10-29 DIAGNOSIS — F419 Anxiety disorder, unspecified: Secondary | ICD-10-CM | POA: Insufficient documentation

## 2022-10-29 DIAGNOSIS — D72829 Elevated white blood cell count, unspecified: Secondary | ICD-10-CM | POA: Diagnosis not present

## 2022-10-29 DIAGNOSIS — Z01812 Encounter for preprocedural laboratory examination: Secondary | ICD-10-CM | POA: Diagnosis not present

## 2022-10-29 DIAGNOSIS — D649 Anemia, unspecified: Secondary | ICD-10-CM | POA: Insufficient documentation

## 2022-10-29 DIAGNOSIS — R0789 Other chest pain: Secondary | ICD-10-CM | POA: Insufficient documentation

## 2022-10-29 DIAGNOSIS — I5022 Chronic systolic (congestive) heart failure: Secondary | ICD-10-CM | POA: Insufficient documentation

## 2022-10-29 DIAGNOSIS — D759 Disease of blood and blood-forming organs, unspecified: Secondary | ICD-10-CM | POA: Diagnosis not present

## 2022-10-29 DIAGNOSIS — C9000 Multiple myeloma not having achieved remission: Secondary | ICD-10-CM | POA: Diagnosis not present

## 2022-10-29 HISTORY — PX: IR BONE MARROW BIOPSY & ASPIRATION: IMG5727

## 2022-10-29 LAB — CBC WITH DIFFERENTIAL/PLATELET
Abs Immature Granulocytes: 0.71 10*3/uL — ABNORMAL HIGH (ref 0.00–0.07)
Basophils Absolute: 0 10*3/uL (ref 0.0–0.1)
Basophils Relative: 0 %
Eosinophils Absolute: 0.1 10*3/uL (ref 0.0–0.5)
Eosinophils Relative: 1 %
HCT: 26 % — ABNORMAL LOW (ref 39.0–52.0)
Hemoglobin: 8.5 g/dL — ABNORMAL LOW (ref 13.0–17.0)
Immature Granulocytes: 7 %
Lymphocytes Relative: 20 %
Lymphs Abs: 2.1 10*3/uL (ref 0.7–4.0)
MCH: 30.7 pg (ref 26.0–34.0)
MCHC: 32.7 g/dL (ref 30.0–36.0)
MCV: 93.9 fL (ref 80.0–100.0)
Monocytes Absolute: 0.8 10*3/uL (ref 0.1–1.0)
Monocytes Relative: 7 %
Neutro Abs: 6.8 10*3/uL (ref 1.7–7.7)
Neutrophils Relative %: 65 %
Platelets: 280 10*3/uL (ref 150–400)
RBC: 2.77 MIL/uL — ABNORMAL LOW (ref 4.22–5.81)
RDW: 16 % — ABNORMAL HIGH (ref 11.5–15.5)
Smear Review: NORMAL
WBC Morphology: ABNORMAL
WBC: 10.6 10*3/uL — ABNORMAL HIGH (ref 4.0–10.5)
nRBC: 0 % (ref 0.0–0.2)

## 2022-10-29 MED ORDER — HEPARIN SOD (PORK) LOCK FLUSH 100 UNIT/ML IV SOLN
INTRAVENOUS | Status: AC
  Filled 2022-10-29: qty 5

## 2022-10-29 MED ORDER — FENTANYL CITRATE (PF) 100 MCG/2ML IJ SOLN
INTRAMUSCULAR | Status: AC
  Filled 2022-10-29: qty 2

## 2022-10-29 MED ORDER — SODIUM CHLORIDE 0.9 % IV SOLN: INTRAVENOUS | Status: DC

## 2022-10-29 MED ORDER — LIDOCAINE 1 % OPTIME INJ - NO CHARGE
10.0000 mL | Freq: Once | INTRAMUSCULAR | Status: AC
  Administered 2022-10-29: 10 mL via INTRADERMAL
  Filled 2022-10-29: qty 10

## 2022-10-29 MED ORDER — MIDAZOLAM HCL 2 MG/2ML IJ SOLN
INTRAMUSCULAR | Status: AC
  Filled 2022-10-29: qty 4

## 2022-10-29 MED ORDER — MIDAZOLAM HCL 2 MG/2ML IJ SOLN
INTRAMUSCULAR | Status: AC | PRN
Start: 2022-10-29 — End: 2022-10-29
  Administered 2022-10-29: 1 mg via INTRAVENOUS

## 2022-10-29 MED ORDER — FENTANYL CITRATE (PF) 100 MCG/2ML IJ SOLN
INTRAMUSCULAR | Status: AC | PRN
  Administered 2022-10-29: 50 ug via INTRAVENOUS

## 2022-10-29 NOTE — Procedures (Signed)
Interventional Radiology Procedure Note  Procedure: CT guided aspirate and core biopsy of right posterior iliac bone Complications: None Recommendations: - Bedrest supine x 1 hrs - OTC's PRN  Pain - Follow biopsy results  Signed,  Yvone Neu. Loreta Ave, DO

## 2022-10-29 NOTE — H&P (Signed)
Chief Complaint: Patient was seen in consultation today for multiple myeloma  Referring Physician(s): Johney Maine  Supervising Physician: Gilmer Mor  Patient Status: [facility] - Out-pt  History of Present Illness: Alexander Saunders is a 72 y.o. male with PMH significant for anxiety, recent chest pain with patient seen in ED on 10/07/22 for CHF, chest pain felt to be non-cardiac in origin, and plasma cell neoplasm s/p epidural mass excision in 2018 being seen today in relation to multiple myeloma. Patient is currently under the care of Dr Candise Che from Oncology service for his multiple myeloma. Patient referred to IR for image-guided bone marrow biopsy.  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   Stem cells transplant status (HCC) 06/2017    Past Surgical History:  Procedure Laterality Date   APPENDECTOMY     BONE MARROW BIOPSY  07/2018   COLONOSCOPY     KNEE ARTHROSCOPY     r/knee   LAMINECTOMY N/A 09/04/2016   Procedure: THORACIC FOUR-SIX LAMINECTOMY FOR RESECTION OF TUMOR;  Surgeon: Ditty, Loura Halt, MD;  Location: [facility] OR;  Service: Neurosurgery;  Laterality: N/A;    Allergies: Jardiance [empagliflozin]  Medications: Prior to Admission medications   Medication Sig Start Date End Date Taking? Authorizing Provider  acyclovir (ZOVIRAX) 400 MG tablet Take 1 tablet (400 mg total) by mouth daily. 03/31/22  Yes Johney Maine, MD  aspirin EC 81 MG tablet Take 81 mg by mouth daily. 01/29/17  Yes [provider]  B Complex Vitamins (B-COMPLEX/B-12) TABS Take 1 tablet by mouth  daily.   Yes [provider]  carboxymethylcellulose (REFRESH PLUS) 0.5 % SOLN Apply 2 drops to eye daily as needed (as needed). 01/23/22  Yes [provider]  diphenhydramine-acetaminophen (TYLENOL PM EXTRA STRENGTH) 25-500 MG TABS tablet Take 1 tablet by mouth at bedtime as needed (pain).   Yes [provider]  gabapentin (NEURONTIN) 300 MG capsule TAKE 3 CAPSULES BY MOUTH THREE TIMES DAILY IN THE MORNING AND IN THE AFTERNOON AND AT NIGHT 04/22/22  Yes Johney Maine, MD  Multiple Vitamin (MULTIVITAMIN) capsule Take 1 capsule by mouth daily.   Yes [provider]  sodium chloride (OCEAN) 0.65 % SOLN nasal spray Place 1 spray into both nostrils daily as needed for congestion.   Yes [provider]  spironolactone (ALDACTONE) 25 MG tablet Take 1 tablet (25 mg total) by mouth daily. 12/05/21  Yes Runell Gess, MD  bisacodyl (DULCOLAX) 5 MG EC tablet Take 5 mg by mouth daily as needed for moderate constipation.    [provider]  carvedilol (COREG) 12.5 MG tablet TAKE 1 TABLET(12.5 MG) BY MOUTH TWICE DAILY Patient not taking: Reported on 10/28/2022 09/24/22   Runell Gess, MD  ENTRESTO 24-26 MG TAKE 1 TABLET BY MOUTH TWICE DAILY Patient not taking: Reported on 10/28/2022 10/24/22   Runell Gess, MD  sildenafil (VIAGRA) 50 MG tablet daily as needed. Patient not taking: Reported on 10/28/2022 07/16/21   [provider]     Family History  Problem Relation  Age of Onset   Hypertension Mother    Cancer Mother    Lung cancer Mother    Hypertension Father    Colon cancer Neg Hx    Colon polyps Neg Hx    Esophageal cancer Neg Hx    Rectal cancer Neg Hx    Stomach cancer Neg Hx     Social History   Socioeconomic History   Marital status: Married    Spouse name: Not on file   Number of children: Not on file   Years of education: Not on file   Highest education level: Not on file  Occupational History   Not on file   Tobacco Use   Smoking status: Never   Smokeless tobacco: Never  Vaping Use   Vaping status: Never Used  Substance and Sexual Activity   Alcohol use: Yes    Comment: occ   Drug use: Never   Sexual activity: Not on file  Other Topics Concern   Not on file  Social History Narrative   He worked as a Sports coach level of education:  college   He is back in college at [employer] studying exercise physiology.       Right handed      Single story home   Social Determinants of Health   Financial Resource Strain: Not on file  Food Insecurity: No Food Insecurity (09/11/2021)   Hunger Vital Sign    Worried About Running Out of Food in the Last Year: Never true    Ran Out of Food in the Last Year: Never true  Transportation Needs: No Transportation Needs (09/11/2021)   PRAPARE - Administrator, Civil Service (Medical): No    Lack of Transportation (Non-Medical): No  Physical Activity: Not on file  Stress: Not on file  Social Connections: Not on file    Code Status: Full code  Review of Systems: A 12 point ROS discussed and pertinent positives are indicated in the HPI above.  All other systems are negative.  Review of Systems  Constitutional:  Negative for chills and fever.  Respiratory:  Negative for chest tightness and shortness of breath.   Cardiovascular:  Positive for chest pain and leg swelling.       Patient reports ongoing chest pain that he feels is related to his multiple myeloma. Patient reports recent cardiac work-up in ED that was negative for cardiac issues at that time  Gastrointestinal:  Negative for abdominal pain, diarrhea, nausea and vomiting.  Neurological:  Positive for light-headedness. Negative for dizziness and headaches.  Psychiatric/Behavioral:  Negative for confusion.     Vital Signs: BP (!) 101/48 (BP Location: Right Arm)   Temp 98.2 F (36.8 C) (Oral)   Resp (!) 23   SpO2 98%    Physical Exam Vitals reviewed.   Constitutional:      General: He is not in acute distress.    Appearance: He is ill-appearing.  HENT:     Mouth/Throat:     Mouth: Mucous membranes are moist.  Cardiovascular:     Rate and Rhythm: Regular rhythm. Tachycardia present.     Pulses: Normal pulses.     Heart sounds: Normal heart sounds.  Pulmonary:     Effort: Pulmonary effort is normal.     Breath sounds: Normal breath sounds.  Abdominal:     Palpations: Abdomen is soft.     Tenderness: There is no abdominal tenderness.  Musculoskeletal:     Right lower  leg: No edema.     Left lower leg: No edema.  Skin:    General: Skin is warm and dry.  Neurological:     Mental Status: He is alert and oriented to person, place, and time.  Psychiatric:        Mood and Affect: Mood normal.        Behavior: Behavior normal.        Thought Content: Thought content normal.        Judgment: Judgment normal.     Imaging: CT Angio Chest PE W and/or Wo Contrast  Result Date: 10/07/2022 CLINICAL DATA:  Pulmonary embolism (PE) suspected, high prob. Chest pain, fatigue EXAM: CT ANGIOGRAPHY CHEST WITH CONTRAST TECHNIQUE: Multidetector CT imaging of the chest was performed using the standard protocol during bolus administration of intravenous contrast. Multiplanar CT image reconstructions and MIPs were obtained to evaluate the vascular anatomy. RADIATION DOSE REDUCTION: This exam was performed according to the departmental dose-optimization program which includes automated exposure control, adjustment of the mA and/or kV according to patient size and/or use of iterative reconstruction technique. CONTRAST:  75mL OMNIPAQUE IOHEXOL 350 MG/ML SOLN COMPARISON:  PET-CT 12/26/2020 and previous FINDINGS: Cardiovascular: No pericardial effusion. Satisfactory opacification of pulmonary arteries noted, and there is no evidence of pulmonary emboli. Scattered coronary calcifications. Adequate contrast opacification of the thoracic aorta with no evidence of  dissection, aneurysm, or stenosis. There is classic 3-vessel brachiocephalic arch anatomy without proximal stenosis. Mediastinum/Nodes: No mediastinal hematoma, mass, or adenopathy. Lungs/Pleura: No pleural effusion. No pneumothorax. Minimal linear scarring/atelectasis in the lung bases. Upper Abdomen: No acute findings. Musculoskeletal: Post posterior thoracic decompression T4-T7. Some sclerosis around the lytic lesion in the T5 vertebral body and pedicles since prior exam. Additional smaller lytic lesions in the more inferior thoracic spine, sternum, and left scapula as before. No acute findings. Review of the MIP images confirms the above findings. IMPRESSION: 1. Negative for acute PE or thoracic aortic dissection. 2. Scattered coronary calcifications. 3. Multiple lytic bone lesions as before consistent with given diagnosis of multiple myeloma. Electronically Signed   By: Corlis Leak M.D.   On: 10/07/2022 15:42   DG Chest 2 View  Result Date: 10/07/2022 CLINICAL DATA:  Chest pressure.  History of myeloma. EXAM: CHEST - 2 VIEW COMPARISON:  X-ray 11/03/2020. FINDINGS: No consolidation, pneumothorax or effusion. No edema. Normal cardiopericardial silhouette. Overlapping cardiac leads. IMPRESSION: No acute cardiopulmonary disease. Electronically Signed   By: Karen Kays M.D.   On: 10/07/2022 14:07    Labs:  CBC: Recent Labs    10/22/22 0000 10/24/22 0853 10/24/22 1015 10/28/22 1105 10/29/22 0833  WBC 10.7 10.3  --  10.2 10.6*  HGB 8.0* 7.4*  --  8.9 Repeated and verified X2.* 8.5*  HCT 24.3* 22.3* 20.9* 26.7 Repeated and verified X2.* 26.0*  PLT 297 277  --  284.0 280    COAGS: No results for input(s): "INR", "APTT" in the last 8760 hours.  BMP: Recent Labs    08/29/22 0823 09/26/22 1111 10/07/22 1215 10/22/22 0000 10/24/22 0853 10/28/22 1105  NA 142 140 134* 140 138 140  K 3.7 4.2 4.3 4.9 4.0 4.3  CL 106 106 103 102 104 105  CO2 29 27 21* 20 21* 23  GLUCOSE 104* 114* 125* 114*  122* 128*  BUN 13 16 25* 23 27* 24*  CALCIUM 9.8 9.8 9.4 10.3* 10.2 9.9  CREATININE 0.95 1.01 1.27* 1.44* 1.27* 1.22  GFRNONAA >60 >60 60*  --  60*  --  LIVER FUNCTION TESTS: Recent Labs    08/29/22 0823 09/26/22 1111 10/24/22 0853 10/28/22 1105  BILITOT 0.4 0.4 1.4* 1.4*  AST 11* 12* 22 17  ALT 8 9 35 33  ALKPHOS 29* 25* 146* 139*  PROT 6.4* 6.5 6.9 5.9*  ALBUMIN 4.3 4.2 4.1 3.9    TUMOR MARKERS: No results for input(s): "AFPTM", "CEA", "CA199", "CHROMGRNA" in the last 8760 hours.  Assessment and Plan:  Oriel Klaus is a 72 yo male being seen today in relation to multiple myeloma. Patient has been referred to IR for image-guided bone marrow biopsy. Patient has had recent instances of chest pain for which he was seen in the ED on 10/07/22 with cardiac work-up negative at that time. Further evaluation by cardiology on 9/18 revealed chronic systolic CHF/non-ischemic cardiomyopathy. Case has been reviewed with Dr Loreta Ave this morning. Patient is NPO. Case tentatively scheduled to proceed on 10/29/22 for image-guided bone marrow biopsy with moderate sedation.  Risks and benefits of image-guided bone marrow biopsy was discussed with the patient and/or patient's family including, but not limited to bleeding, infection, damage to adjacent structures or low yield requiring additional tests.  All of the questions were answered and there is agreement to proceed.  Consent signed and in chart.   Thank you for this interesting consult.  I greatly enjoyed meeting Alexander Saunders and look forward to participating in their care.  A copy of this report was sent to the requesting provider on this date.  Electronically Signed: Kennieth Francois, PA-C 10/29/2022, 8:46 AM   I spent a total of 15 Minutes   in face to face in clinical consultation, greater than 50% of which was counseling/coordinating care for multiple myeloma.

## 2022-10-30 ENCOUNTER — Ambulatory Visit: Payer: Medicare Other | Admitting: Radiology

## 2022-10-30 ENCOUNTER — Ambulatory Visit (AMBULATORY_SURGERY_CENTER): Payer: Medicare Other | Admitting: Gastroenterology

## 2022-10-30 ENCOUNTER — Encounter: Payer: Self-pay | Admitting: Gastroenterology

## 2022-10-30 VITALS — BP 91/47 | HR 106 | Temp 98.2°F | Resp 20 | Ht 72.0 in | Wt 184.0 lb

## 2022-10-30 DIAGNOSIS — R630 Anorexia: Secondary | ICD-10-CM | POA: Diagnosis not present

## 2022-10-30 DIAGNOSIS — D649 Anemia, unspecified: Secondary | ICD-10-CM | POA: Diagnosis not present

## 2022-10-30 DIAGNOSIS — R634 Abnormal weight loss: Secondary | ICD-10-CM | POA: Diagnosis not present

## 2022-10-30 LAB — MULTIPLE MYELOMA PANEL, SERUM
Albumin SerPl Elph-Mcnc: 3.2 g/dL (ref 2.9–4.4)
Albumin/Glob SerPl: 1.2 (ref 0.7–1.7)
Alpha 1: 0.5 g/dL — ABNORMAL HIGH (ref 0.0–0.4)
Alpha2 Glob SerPl Elph-Mcnc: 1.1 g/dL — ABNORMAL HIGH (ref 0.4–1.0)
B-Globulin SerPl Elph-Mcnc: 0.9 g/dL (ref 0.7–1.3)
Gamma Glob SerPl Elph-Mcnc: 0.3 g/dL — ABNORMAL LOW (ref 0.4–1.8)
Globulin, Total: 2.9 g/dL (ref 2.2–3.9)
IgA: 13 mg/dL — ABNORMAL LOW (ref 61–437)
IgG (Immunoglobin G), Serum: 432 mg/dL — ABNORMAL LOW (ref 603–1613)
IgM (Immunoglobulin M), Srm: 5 mg/dL — ABNORMAL LOW (ref 15–143)
Total Protein ELP: 6.1 g/dL (ref 6.0–8.5)

## 2022-10-30 MED ORDER — SODIUM CHLORIDE 0.9 % IV SOLN: 500.0000 mL | Freq: Once | INTRAVENOUS | Status: DC

## 2022-10-30 NOTE — Op Note (Addendum)
[facility] Patient Name: Alexander Saunders Procedure Date: 10/30/2022 1:25 PM MRN: [id number] Endoscopist: Meryl Dare , MD, [phone] Age: 72 Referring MD:  Date of Birth: 1950-06-28 Gender: Male Account #: [id number] Procedure:                Upper GI endoscopy Indications:              Anemia, unspecified, Anorexia, Weight loss Medicines:                Monitored Anesthesia Care Procedure:                Pre-Anesthesia Assessment:                           - Prior to the procedure, a History and Physical                            was performed, and patient medications and                            allergies were reviewed. The patient's tolerance of                            previous anesthesia was also reviewed. The risks                            and benefits of the procedure and the sedation                            options and risks were discussed with the patient.                            All questions were answered, and informed consent                            was obtained. Prior Anticoagulants: The patient has                            taken no anticoagulant or antiplatelet agents. ASA                            Grade Assessment: III - A patient with severe                            systemic disease. After reviewing the risks and                            benefits, the patient was deemed in satisfactory                            condition to undergo the procedure.                           After obtaining informed consent, the endoscope was  passed under direct vision. Throughout the                            procedure, the patient's blood pressure, pulse, and                            oxygen saturations were monitored continuously. The                            GIF [id number] #[id number] was introduced through the                            mouth, and advanced to the second part of duodenum.                            The upper  GI endoscopy was accomplished without                            difficulty. The patient tolerated the procedure                            well. Scope In: Scope Out: Findings:                 The examined esophagus was normal.                           The entire examined stomach was normal.                           The duodenal bulb and second portion of the                            duodenum were normal. Complications:            No immediate complications. Estimated Blood Loss:     Estimated blood loss: none. Impression:               - Normal esophagus.                           - Normal stomach.                           - Normal duodenal bulb and second portion of the                            duodenum.                           - No specimens collected. Recommendation:           - Patient has a contact number available for                            emergencies. The signs and symptoms of potential  delayed complications were discussed with the                            patient. Return to normal activities tomorrow.                            Written discharge instructions were provided to the                            patient.                           - Resume previous diet.                           - Continue present medications.                           - Follow up with Dr. Andrey Spearman, MD 10/30/2022 1:45:07 PM This report has been signed electronically.

## 2022-10-30 NOTE — Progress Notes (Signed)
See 10/28/2022 H&P no changes

## 2022-10-30 NOTE — Patient Instructions (Addendum)

## 2022-10-30 NOTE — Progress Notes (Signed)
Vss nad trans to pacu 

## 2022-10-31 ENCOUNTER — Telehealth: Payer: Self-pay

## 2022-10-31 LAB — SURGICAL PATHOLOGY

## 2022-10-31 NOTE — Telephone Encounter (Signed)
  Follow up Call-     10/30/2022   12:46 PM 07/23/2022   10:07 AM  Call back number  Post procedure Call Back phone  # [phone]  Permission to leave phone message Yes Yes   LMOM

## 2022-11-03 ENCOUNTER — Inpatient Hospital Stay (HOSPITAL_BASED_OUTPATIENT_CLINIC_OR_DEPARTMENT_OTHER): Payer: Medicare Other | Admitting: Hematology

## 2022-11-03 ENCOUNTER — Ambulatory Visit: Payer: Medicare Other | Admitting: Radiology

## 2022-11-03 DIAGNOSIS — D649 Anemia, unspecified: Secondary | ICD-10-CM | POA: Diagnosis not present

## 2022-11-03 DIAGNOSIS — K5900 Constipation, unspecified: Secondary | ICD-10-CM | POA: Diagnosis not present

## 2022-11-03 DIAGNOSIS — E559 Vitamin D deficiency, unspecified: Secondary | ICD-10-CM | POA: Diagnosis not present

## 2022-11-03 DIAGNOSIS — E790 Hyperuricemia without signs of inflammatory arthritis and tophaceous disease: Secondary | ICD-10-CM | POA: Diagnosis not present

## 2022-11-03 DIAGNOSIS — D630 Anemia in neoplastic disease: Secondary | ICD-10-CM | POA: Diagnosis not present

## 2022-11-03 DIAGNOSIS — B370 Candidal stomatitis: Secondary | ICD-10-CM | POA: Diagnosis not present

## 2022-11-03 DIAGNOSIS — C9010 Plasma cell leukemia not having achieved remission: Secondary | ICD-10-CM | POA: Diagnosis not present

## 2022-11-03 DIAGNOSIS — E43 Unspecified severe protein-calorie malnutrition: Secondary | ICD-10-CM | POA: Diagnosis not present

## 2022-11-03 DIAGNOSIS — C9000 Multiple myeloma not having achieved remission: Secondary | ICD-10-CM | POA: Diagnosis not present

## 2022-11-03 DIAGNOSIS — Z79899 Other long term (current) drug therapy: Secondary | ICD-10-CM | POA: Diagnosis not present

## 2022-11-03 DIAGNOSIS — Z7982 Long term (current) use of aspirin: Secondary | ICD-10-CM | POA: Diagnosis not present

## 2022-11-03 DIAGNOSIS — C9002 Multiple myeloma in relapse: Secondary | ICD-10-CM | POA: Diagnosis not present

## 2022-11-03 DIAGNOSIS — K219 Gastro-esophageal reflux disease without esophagitis: Secondary | ICD-10-CM | POA: Diagnosis not present

## 2022-11-03 DIAGNOSIS — Z7984 Long term (current) use of oral hypoglycemic drugs: Secondary | ICD-10-CM | POA: Diagnosis not present

## 2022-11-03 DIAGNOSIS — N179 Acute kidney failure, unspecified: Secondary | ICD-10-CM | POA: Diagnosis not present

## 2022-11-03 DIAGNOSIS — Z7189 Other specified counseling: Secondary | ICD-10-CM

## 2022-11-03 DIAGNOSIS — Z9484 Stem cells transplant status: Secondary | ICD-10-CM | POA: Diagnosis not present

## 2022-11-03 DIAGNOSIS — I5022 Chronic systolic (congestive) heart failure: Secondary | ICD-10-CM | POA: Diagnosis not present

## 2022-11-03 DIAGNOSIS — Z5111 Encounter for antineoplastic chemotherapy: Secondary | ICD-10-CM | POA: Diagnosis not present

## 2022-11-03 DIAGNOSIS — R627 Adult failure to thrive: Secondary | ICD-10-CM | POA: Diagnosis not present

## 2022-11-03 DIAGNOSIS — R066 Hiccough: Secondary | ICD-10-CM | POA: Diagnosis not present

## 2022-11-03 DIAGNOSIS — I110 Hypertensive heart disease with heart failure: Secondary | ICD-10-CM | POA: Diagnosis not present

## 2022-11-03 DIAGNOSIS — I959 Hypotension, unspecified: Secondary | ICD-10-CM | POA: Diagnosis not present

## 2022-11-03 DIAGNOSIS — E1140 Type 2 diabetes mellitus with diabetic neuropathy, unspecified: Secondary | ICD-10-CM | POA: Diagnosis not present

## 2022-11-03 DIAGNOSIS — Z6827 Body mass index (BMI) 27.0-27.9, adult: Secondary | ICD-10-CM | POA: Diagnosis not present

## 2022-11-03 NOTE — Progress Notes (Addendum)
HEMATOLOGY/ONCOLOGY PHONE VISIT NOTE  Date of Service: 11/03/22  Patient Care Team: Jackie Plum, MD as PCP - General (Internal Medicine) Runell Gess, MD as PCP - Cardiology (Cardiology)  CHIEF COMPLAINTS/PURPOSE OF CONSULTATION:  Evaluation and management of Kappa Light Chain Multiple Myeloma  HISTORY OF PRESENTING ILLNESS:   Alexander Saunders is a wonderful 72 y.o. male who is here for evaluation and management of multiple myeloma. Patient was following with Dr. Clelia Croft and has been transferred to [place]. Patient was diagnosed with multiple myeloma in 2018 which relapsed in 2022 with prior treatment as noted below.  Prior Therapy:   He is status post surgical decompression of an epidural mass on 09/04/2016 and the pathology showed a plasma cell neoplasm.   He is S/P adjuvant radiation therapy to the thoracic spine to be completed on 10/14/2016.   Velcade 1.5 mg/m weekly with dexamethasone 20 mg and Cytoxan 600 mg po. Therapy started on 10/24/2016.    High-dose chemotherapy followed by stem cell transplant: Melphalan 200 mg/m2 completed on 06/17/17 followed by autologous stem cell rescue 06/18/17.  He achieved complete response.     Zometa 4 mg every 3 months started in February 2020.  Last treatment given in January 2022.     Revlimid 10 mg daily for 21 days out of a 28-day started in February 2020.  Therapy discontinued in November 2022 because of worsening anemia.   Carfilzomib 20 mg/m weekly started on February 19, 2021.  Therapy stopped in March 2023 for presumed cardiomyopathy complication.     Daratumumab 1800 mg subcutaneous injection weekly started on February 19, 2021 with Dexamethasone weekly.     Current treatment: Patient is currently on monthly Daratumumab 1800 mg subcutaneous injections per protocol currently every 28 days with Dexamethasone weekly 20 mg.  Patient notes he is doing well overall without any new medical concern since his last visit with  Dr. Clelia Croft. He complains of weight gain from Dexamethasone, which he is currently trying to loss. He denies any toxicities with Daratumumab.   Patient notes that he tested positive for COVID-19 around 2-3 months and notes that his symptoms have improved since.  He denies fever, chills, night sweats, abdominal pain, back pain, chest pain, or leg swelling. However, he does complain of intermittent hip pain.  He has received his influenza vaccine, but denies COVID-19 Booster ad RSV vaccine.    INTERVAL HISTORY:  Alexander Saunders is a wonderful 72 y.o. male who is here for evaluation and management of multiple myeloma.   I connected with Alexander Saunders on 11/03/22 at  1:30 PM EDT by telephone visit and verified that I am speaking with the correct person using two identifiers.   I last connected with Alexander Saunders on 10/17/2022 and he complained of fatigue and intermittent chest wall discomfort/soreness.   Patient notes he has not been doing well since our last visit. He complains of increased fatigue. He denies any bone pain during this visit. Patient's wife notes that he is not eating as much since our last visit, which has caused him to lose weight. He has been taking ensure supplement.   I discussed the limitations, risks, security and privacy concerns of performing an evaluation and management service by telemedicine and the availability of in-person appointments. I also discussed with the patient that there may be a patient responsible charge related to this service. The patient expressed understanding and agreed to proceed.   Other persons participating in the visit and  their role in the encounter: Wife   Patient's location: home  Provider's location: [facility]   Chief Complaint: evaluation and management of multiple myeloma     MEDICAL HISTORY:  Past Medical History:  Diagnosis Date   Anxiety    occasional    Blood transfusion without reported diagnosis 2018   with spinal surgery    History of chemotherapy    completed 06-17-2017   History of radiation therapy    completed 11-2016 per pt   Multiple myeloma (HCC) 2018   currently in remission    Neuromuscular disorder (HCC)    neuropathy lower extremeties, primarily right leg    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   Stem cells transplant status (HCC) 06/2017    SURGICAL HISTORY: Past Surgical History:  Procedure Laterality Date   APPENDECTOMY     BONE MARROW BIOPSY  07/2018   COLONOSCOPY     IR BONE MARROW BIOPSY & ASPIRATION  10/29/2022   KNEE ARTHROSCOPY     r/knee   LAMINECTOMY N/A 09/04/2016   Procedure: THORACIC FOUR-SIX LAMINECTOMY FOR RESECTION OF TUMOR;  Surgeon: Ditty, Loura Halt, MD;  Location: [facility] OR;  Service: Neurosurgery;  Laterality: N/A;    SOCIAL HISTORY: Social History   Socioeconomic History   Marital status: Married    Spouse name: Not on file   Number of children: Not on file   Years of education: Not on file   Highest education level: Not on file  Occupational History   Not on file  Tobacco Use   Smoking status: Never   Smokeless tobacco: Never  Vaping Use   Vaping status: Never Used  Substance and Sexual Activity   Alcohol use: Yes    Comment: occ   Drug use: Never   Sexual activity: Not on file  Other Topics Concern   Not on file  Social History Narrative   He worked as a Sports coach level of education:  college   He is back in college at [employer] studying exercise physiology.       Right handed      Single story home   Social Determinants of Health   Financial Resource Strain: Not on file  Food Insecurity: No Food Insecurity (09/11/2021)   Hunger Vital Sign    Worried About Running Out of Food in the Last Year: Never true    Ran Out of Food in the Last Year: Never true  Transportation Needs: No Transportation Needs (09/11/2021)   PRAPARE - Administrator, Civil Service (Medical): No    Lack of Transportation (Non-Medical): No  Physical  Activity: Not on file  Stress: Not on file  Social Connections: Not on file  Intimate Partner Violence: Not on file    FAMILY HISTORY: Family History  Problem Relation Age of Onset   Hypertension Mother    Cancer Mother    Lung cancer Mother    Hypertension Father    Colon cancer Neg Hx    Colon polyps Neg Hx    Esophageal cancer Neg Hx    Rectal cancer Neg Hx    Stomach cancer Neg Hx     ALLERGIES:  is allergic to jardiance [empagliflozin].  MEDICATIONS:  Current Outpatient Medications  Medication Sig Dispense Refill   acyclovir (ZOVIRAX) 400 MG tablet Take 1 tablet (400 mg total) by mouth daily. 90 tablet 3   aspirin EC 81 MG tablet Take 81 mg by mouth daily.     B Complex  Vitamins (B-COMPLEX/B-12) TABS Take 1 tablet by mouth daily.     bisacodyl (DULCOLAX) 5 MG EC tablet Take 5 mg by mouth daily as needed for moderate constipation.     carboxymethylcellulose (REFRESH PLUS) 0.5 % SOLN Apply 2 drops to eye daily as needed (as needed).     carvedilol (COREG) 12.5 MG tablet TAKE 1 TABLET(12.5 MG) BY MOUTH TWICE DAILY (Patient not taking: Reported on 10/28/2022) 180 tablet 3   diphenhydramine-acetaminophen (TYLENOL PM EXTRA STRENGTH) 25-500 MG TABS tablet Take 1 tablet by mouth at bedtime as needed (pain).     ENTRESTO 24-26 MG TAKE 1 TABLET BY MOUTH TWICE DAILY (Patient not taking: Reported on 10/28/2022) 60 tablet 3   gabapentin (NEURONTIN) 300 MG capsule TAKE 3 CAPSULES BY MOUTH THREE TIMES DAILY IN THE MORNING AND IN THE AFTERNOON AND AT NIGHT 270 capsule 3   Multiple Vitamin (MULTIVITAMIN) capsule Take 1 capsule by mouth daily.     sildenafil (VIAGRA) 50 MG tablet daily as needed. (Patient not taking: Reported on 10/28/2022)     sodium chloride (OCEAN) 0.65 % SOLN nasal spray Place 1 spray into both nostrils daily as needed for congestion.     spironolactone (ALDACTONE) 25 MG tablet Take 1 tablet (25 mg total) by mouth daily. (Patient not taking: Reported on 10/30/2022) 90 tablet  3   No current facility-administered medications for this visit.    REVIEW OF SYSTEMS:    10 Point review of Systems was done is negative except as noted above.    PHYSICAL EXAMINATION: TELEMEDICINE VISIT  LABORATORY DATA:  I have reviewed the data as listed .    Latest Ref Rng & Units 10/29/2022    8:33 AM 10/28/2022   11:05 AM 10/24/2022   10:15 AM  CBC  WBC 4.0 - 10.5 K/uL 10.6  10.2    Hemoglobin 13.0 - 17.0 g/dL 8.5  8.9 Repeated and verified X2.    Hematocrit 39.0 - 52.0 % 26.0  26.7 Repeated and verified X2.  20.9   Platelets 150 - 400 K/uL 280  284.0     .    Latest Ref Rng & Units 10/28/2022   11:05 AM 10/24/2022    8:53 AM 10/22/2022   12:00 AM  CMP  Glucose 70 - 99 mg/dL 161  096  045   BUN 6 - 23 mg/dL 24  27  23    Creatinine 0.40 - 1.50 mg/dL 4.09  8.11  9.14   Sodium 135 - 145 mEq/L 140  138  140   Potassium 3.5 - 5.1 mEq/L 4.3  4.0  4.9   Chloride 96 - 112 mEq/L 105  104  102   CO2 19 - 32 mEq/L 23  21  20    Calcium 8.4 - 10.5 mg/dL 9.9  78.2  95.6   Total Protein 6.0 - 8.3 g/dL 5.9  6.9    Total Bilirubin 0.2 - 1.2 mg/dL 1.4  1.4    Alkaline Phos 39 - 117 U/L 139  146    AST 0 - 37 U/L 17  22    ALT 0 - 53 U/L 33  35      Surgical Pathology CASE: [id number] PATIENT: Dreydon Lensing Bone Marrow Report  Clinical History: Myeloma     DIAGNOSIS:  BONE MARROW, ASPIRATE, CLOT, CORE: -  Plasma cell neoplasm with anaplastic morphology involving 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  PERIPHERAL BLOOD: -  Normochromic normocytic anemia. -  Circulating atypical plasma cells at approximately  9%  Note: The clinical history of plasma cell myeloma is noted.  The findings are consistent with recurrent/persistent myeloma; however, the morphologic findings are now highly atypical.  Lymphatic, morphologically it is difficult to characterize the cells as plasma cells.  The plasma cell lineage was confirmed by immunohistochemistry. Overall the  findings are consistent with a plasma cell myeloma with anaplastic features.   Surgical Pathology  THIS IS AN ADDENDUM REPORT  CASE: [id number] PATIENT: Jenkins Craver Bone Marrow Report Addendum   Reason for Addendum #1:  Additional special stains Reason for Addendum #2:  Cytogenetics results  Clinical History: multiple myeloma   DIAGNOSIS:  BONE MARROW, ASPIRATE, CLOT, CORE: -  Hypercellular marrow involved by plasma cell myeloma -  See comment  PERIPHERAL BLOOD: -  Macrocytic anemia -  See complete blood cell count  COMMENT:  The findings are consistent with recurrence of the patient's previously diagnosed plasma cell myeloma.  For completeness, immunohistochemistry (CD138, kappa and lambda by in situ hybridization) are pending and will be reported in an addendum.  MICROSCOPIC DESCRIPTION:  PERIPHERAL BLOOD SMEAR: The peripheral blood has a macrocytic anemia. There is no significant rouleaux formation identified.  Plasmacytoid lymphocytes are seen.  Platelets are within normal limits.  BONE MARROW ASPIRATE: Spicular, cellular and adequate for evaluation Erythroid precursors: Diminished numbers, no overt dysplasia Granulocytic precursors: Diminished numbers, no overt dysplasia Megakaryocytes: Present Lymphocytes/plasma cells: There is a marked increase of atypical plasma cells.  The plasma cells are enlarged and multinucleated.  There is no lymphocytosis.  TOUCH PREPARATIONS: No additional findings as compared to the aspirate smears  CLOT AND BIOPSY: Examination of the bone marrow core biopsy and clot section reveals a hypercellular marrow for age (> 95%) involved by plasma cell neoplasm.  Background hematopoiesis is markedly diminished. Megakaryocytes are present.  There are no granulomas identified.  IRON STAIN: Iron stains are performed on a bone marrow aspirate or touch imprint smear and section of clot. The controls stained appropriately.        Storage Iron: Present (aspirate smears)      Ring Sideroblasts: Not identified    RADIOGRAPHIC STUDIES: I have personally reviewed the radiological images as listed and agreed with the findings in the report. IR BONE MARROW BIOPSY & ASPIRATION  Result Date: 10/29/2022 INDICATION: 72 year old male referred for bone marrow biopsy EXAM: IR BONE MARRO BIOPY AND ASPIRATION MEDICATIONS: None. ANESTHESIA/SEDATION: Moderate (conscious) sedation was employed during this procedure. A total of Versed 1 mg and Fentanyl 50 mcg was administered intravenously. Moderate Sedation Time: 12 minutes. The patient's level of consciousness and vital signs were monitored continuously by radiology nursing throughout the procedure under my direct supervision. FLUOROSCOPY TIME:  Fluoroscopy Time:   (8 mGy). COMPLICATIONS: None PROCEDURE: Informed written consent was obtained from the patient after a thorough discussion of the procedural risks, benefits and alternatives. All questions were addressed. Maximal Sterile Barrier Technique was utilized including caps, mask, sterile gowns, sterile gloves, sterile drape, hand hygiene and skin antiseptic. A timeout was performed prior to the initiation of the procedure. The procedure risks, benefits, and alternatives were explained to the patient. Questions regarding the procedure were encouraged and answered. The patient understands and consents to the procedure. Patient was positioned prone under the image intensifier. Physical exam was used to determine the L5- S1 level, and then the posterior pelvis was prepped with Chlorhexidine in a sterile fashion. A sterile drape was applied covering the operative field. Sterile gown/sterile gloves were used for the procedure. Local  anesthesia was provided with 1% Lidocaine. Posterior right iliac bone was targeted for biopsy. The skin and subcutaneous tissues were infiltrated with 1% lidocaine without epinephrine. A small stab incision was made with an  11 blade scalpel, and an 11 gauge Murphy needle was advanced with fluoro guidance to the posterior cortex. Manual forced was used to advance the needle through the posterior cortex and the stylet was removed. A bone marrow aspirate was retrieved and passed to a cytotechnologist in the room. The Murphy needle was then advanced without the stylet for a core biopsy. The core biopsy was retrieved and also passed to a cytotechnologist. Manual pressure was used for hemostasis and a sterile dressing was placed. No complications were encountered no significant blood loss was encountered. Patient tolerated the procedure well and remained hemodynamically stable throughout. IMPRESSION: Status post image guided bone marrow biopsy. Signed, Yvone Neu. Miachel Roux, RPVI [facility] Inter[facility] Radiology Specialists [facility] Radiology Electronically Signed   By: Gilmer Mor D.O.   On: 10/29/2022 10:32   CT Angio Chest PE W and/or Wo Contrast  Result Date: 10/07/2022 CLINICAL DATA:  Pulmonary embolism (PE) suspected, high prob. Chest pain, fatigue EXAM: CT ANGIOGRAPHY CHEST WITH CONTRAST TECHNIQUE: Multidetector CT imaging of the chest was performed using the standard protocol during bolus administration of intravenous contrast. Multiplanar CT image reconstructions and MIPs were obtained to evaluate the vascular anatomy. RADIATION DOSE REDUCTION: This exam was performed according to the departmental dose-optimization program which includes automated exposure control, adjustment of the mA and/or kV according to patient size and/or use of iterative reconstruction technique. CONTRAST:  75mL OMNIPAQUE IOHEXOL 350 MG/ML SOLN COMPARISON:  PET-CT 12/26/2020 and previous FINDINGS: Cardiovascular: No pericardial effusion. Satisfactory opacification of pulmonary arteries noted, and there is no evidence of pulmonary emboli. Scattered coronary calcifications. Adequate contrast opacification of the thoracic aorta with no evidence  of dissection, aneurysm, or stenosis. There is classic 3-vessel brachiocephalic arch anatomy without proximal stenosis. Mediastinum/Nodes: No mediastinal hematoma, mass, or adenopathy. Lungs/Pleura: No pleural effusion. No pneumothorax. Minimal linear scarring/atelectasis in the lung bases. Upper Abdomen: No acute findings. Musculoskeletal: Post posterior thoracic decompression T4-T7. Some sclerosis around the lytic lesion in the T5 vertebral body and pedicles since prior exam. Additional smaller lytic lesions in the more inferior thoracic spine, sternum, and left scapula as before. No acute findings. Review of the MIP images confirms the above findings. IMPRESSION: 1. Negative for acute PE or thoracic aortic dissection. 2. Scattered coronary calcifications. 3. Multiple lytic bone lesions as before consistent with given diagnosis of multiple myeloma. Electronically Signed   By: Corlis Leak M.D.   On: 10/07/2022 15:42   DG Chest 2 View  Result Date: 10/07/2022 CLINICAL DATA:  Chest pressure.  History of myeloma. EXAM: CHEST - 2 VIEW COMPARISON:  X-ray 11/03/2020. FINDINGS: No consolidation, pneumothorax or effusion. No edema. Normal cardiopericardial silhouette. Overlapping cardiac leads. IMPRESSION: No acute cardiopulmonary disease. Electronically Signed   By: Karen Kays M.D.   On: 10/07/2022 14:07   Pet/ct 12/2020  IMPRESSION: 1. No evidence of FDG avid nodal or visceral metastatic disease in the chest, abdomen or pelvis. 2. Numerous osseous lytic lesions are similar in appearance when compared with 2018 prior. No focal FDG uptake above background marrow activity. Findings are compatible with history of multiple myeloma. 3. Coronary artery calcifications of the LAD and circumflex. 4. Mild aortic Atherosclerosis (ICD10-I70.0).     Electronically Signed   By: Allegra Lai M.D.   On: 12/28/2020 12:34  Pet/ct 03/16/2017 Impression   1.  Focal increased FDG uptake in the spinous process of  T3 is consistent with site of multiple myeloma. 2.  No additional sites of increased metabolic activity to suggest cluster of myelomatous cells. 3.  Innumerable lytic lesions in the axial skeleton.    ASSESSMENT & PLAN:   72 y.o. male with:  Multiple Myeloma -First diagnosed in 2018 an relapsed in 2022.  Cytogenetics normal male karyotype Mol Cy - not done  PLAN: -Discussed Bone marrow biopsy results from 10/29/2022 in detail with the patient. Showed abnormalities and progression. Plasma cell neoplasm with anaplastic morphology involving  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  PBS-  Circulating atypical plasma cells at approximately 9%  -Discussed with the patient that we have called Dr. Clarisa Kindred office and they will call the patient to confirm today's visit as this needs urgent evaluation.  -Discussed that Daratumumab is not working and Dr. Anne Hahn will talk about treatment options today.   -Patient will likely transfer care to [facility] for further treatments.   FOLLOW-UP: Referred to Dr Anne Hahn --for anaplastic plasma cell progression of multiple myeloma.  Patient has appointment today at 2 PM. He will likely transfer care to [facility] for further treatments.   The total time spent in the appointment was 30 minutes* .  All of the patient's questions were answered with apparent satisfaction. The patient knows to call the clinic with any problems, questions or concerns.   Wyvonnia Lora MD MS AAHIVMS [facility] Hematology/Oncology Physician [facility]  .*Total Encounter Time as defined by the Centers for Medicare and Medicaid Services includes, in addition to the face-to-face time of a patient visit (documented in the note above) non-face-to-face time: obtaining and reviewing outside history, ordering and reviewing medications, tests or procedures, care coordination (communications with other health care professionals or caregivers) and documentation in  the medical record.   I,Param Shah,acting as a Neurosurgeon for Wyvonnia Lora, MD.,have documented all relevant documentation on the behalf of Wyvonnia Lora, MD,as directed by  Wyvonnia Lora, MD while in the presence of Wyvonnia Lora, MD.   .I have reviewed the above documentation for accuracy and completeness, and I agree with the above. Johney Maine MD

## 2022-11-06 ENCOUNTER — Inpatient Hospital Stay: Payer: Medicare Other | Attending: Oncology | Admitting: Hematology

## 2022-11-07 ENCOUNTER — Other Ambulatory Visit: Payer: Medicare Other

## 2022-11-07 ENCOUNTER — Encounter (HOSPITAL_COMMUNITY): Payer: Self-pay

## 2022-11-07 ENCOUNTER — Other Ambulatory Visit: Payer: Self-pay

## 2022-11-07 DIAGNOSIS — C9000 Multiple myeloma not having achieved remission: Secondary | ICD-10-CM

## 2022-11-09 ENCOUNTER — Encounter: Payer: Self-pay | Admitting: Hematology

## 2022-11-10 DIAGNOSIS — C9000 Multiple myeloma not having achieved remission: Secondary | ICD-10-CM | POA: Diagnosis not present

## 2022-11-12 ENCOUNTER — Encounter (HOSPITAL_COMMUNITY): Payer: Self-pay

## 2022-11-13 DIAGNOSIS — R3911 Hesitancy of micturition: Secondary | ICD-10-CM | POA: Diagnosis not present

## 2022-11-13 DIAGNOSIS — N401 Enlarged prostate with lower urinary tract symptoms: Secondary | ICD-10-CM | POA: Diagnosis not present

## 2022-11-13 DIAGNOSIS — R350 Frequency of micturition: Secondary | ICD-10-CM | POA: Diagnosis not present

## 2022-11-14 ENCOUNTER — Inpatient Hospital Stay: Payer: Medicare Other | Attending: Oncology

## 2022-11-14 ENCOUNTER — Other Ambulatory Visit: Payer: Medicare Other

## 2022-11-14 DIAGNOSIS — E861 Hypovolemia: Secondary | ICD-10-CM | POA: Diagnosis not present

## 2022-11-14 DIAGNOSIS — R059 Cough, unspecified: Secondary | ICD-10-CM | POA: Diagnosis not present

## 2022-11-14 DIAGNOSIS — Z923 Personal history of irradiation: Secondary | ICD-10-CM | POA: Diagnosis not present

## 2022-11-14 DIAGNOSIS — D709 Neutropenia, unspecified: Secondary | ICD-10-CM | POA: Diagnosis not present

## 2022-11-14 DIAGNOSIS — T451X5A Adverse effect of antineoplastic and immunosuppressive drugs, initial encounter: Secondary | ICD-10-CM | POA: Diagnosis not present

## 2022-11-14 DIAGNOSIS — Z1152 Encounter for screening for COVID-19: Secondary | ICD-10-CM | POA: Diagnosis not present

## 2022-11-14 DIAGNOSIS — R17 Unspecified jaundice: Secondary | ICD-10-CM | POA: Diagnosis not present

## 2022-11-14 DIAGNOSIS — D61810 Antineoplastic chemotherapy induced pancytopenia: Secondary | ICD-10-CM | POA: Diagnosis present

## 2022-11-14 DIAGNOSIS — E876 Hypokalemia: Secondary | ICD-10-CM | POA: Diagnosis not present

## 2022-11-14 DIAGNOSIS — Z9484 Stem cells transplant status: Secondary | ICD-10-CM | POA: Diagnosis not present

## 2022-11-14 DIAGNOSIS — R5081 Fever presenting with conditions classified elsewhere: Secondary | ICD-10-CM | POA: Diagnosis not present

## 2022-11-14 DIAGNOSIS — G629 Polyneuropathy, unspecified: Secondary | ICD-10-CM | POA: Diagnosis not present

## 2022-11-14 DIAGNOSIS — Z801 Family history of malignant neoplasm of trachea, bronchus and lung: Secondary | ICD-10-CM | POA: Diagnosis not present

## 2022-11-14 DIAGNOSIS — Z7982 Long term (current) use of aspirin: Secondary | ICD-10-CM | POA: Diagnosis not present

## 2022-11-14 DIAGNOSIS — R0602 Shortness of breath: Secondary | ICD-10-CM | POA: Diagnosis not present

## 2022-11-14 DIAGNOSIS — C9000 Multiple myeloma not having achieved remission: Secondary | ICD-10-CM | POA: Diagnosis present

## 2022-11-14 DIAGNOSIS — R627 Adult failure to thrive: Secondary | ICD-10-CM | POA: Diagnosis not present

## 2022-11-14 DIAGNOSIS — Z6824 Body mass index (BMI) 24.0-24.9, adult: Secondary | ICD-10-CM | POA: Diagnosis not present

## 2022-11-14 DIAGNOSIS — D61818 Other pancytopenia: Secondary | ICD-10-CM | POA: Diagnosis not present

## 2022-11-14 DIAGNOSIS — I429 Cardiomyopathy, unspecified: Secondary | ICD-10-CM | POA: Diagnosis not present

## 2022-11-14 DIAGNOSIS — D696 Thrombocytopenia, unspecified: Secondary | ICD-10-CM | POA: Diagnosis not present

## 2022-11-14 DIAGNOSIS — R6520 Severe sepsis without septic shock: Secondary | ICD-10-CM | POA: Diagnosis not present

## 2022-11-14 DIAGNOSIS — E43 Unspecified severe protein-calorie malnutrition: Secondary | ICD-10-CM | POA: Diagnosis present

## 2022-11-14 DIAGNOSIS — D701 Agranulocytosis secondary to cancer chemotherapy: Secondary | ICD-10-CM | POA: Diagnosis not present

## 2022-11-14 DIAGNOSIS — E871 Hypo-osmolality and hyponatremia: Secondary | ICD-10-CM | POA: Diagnosis present

## 2022-11-14 DIAGNOSIS — Z8249 Family history of ischemic heart disease and other diseases of the circulatory system: Secondary | ICD-10-CM | POA: Diagnosis not present

## 2022-11-14 DIAGNOSIS — J189 Pneumonia, unspecified organism: Secondary | ICD-10-CM | POA: Diagnosis not present

## 2022-11-14 DIAGNOSIS — K1230 Oral mucositis (ulcerative), unspecified: Secondary | ICD-10-CM | POA: Diagnosis not present

## 2022-11-14 DIAGNOSIS — Z9221 Personal history of antineoplastic chemotherapy: Secondary | ICD-10-CM | POA: Diagnosis not present

## 2022-11-14 DIAGNOSIS — R509 Fever, unspecified: Secondary | ICD-10-CM | POA: Diagnosis not present

## 2022-11-14 DIAGNOSIS — A4152 Sepsis due to Pseudomonas: Secondary | ICD-10-CM | POA: Diagnosis present

## 2022-11-14 DIAGNOSIS — R350 Frequency of micturition: Secondary | ICD-10-CM | POA: Diagnosis not present

## 2022-11-14 DIAGNOSIS — J690 Pneumonitis due to inhalation of food and vomit: Secondary | ICD-10-CM | POA: Diagnosis present

## 2022-11-14 LAB — CBC WITH DIFFERENTIAL (CANCER CENTER ONLY)
Abs Immature Granulocytes: 0 10*3/uL (ref 0.00–0.07)
Basophils Absolute: 0 10*3/uL (ref 0.0–0.1)
Basophils Relative: 0 %
Eosinophils Absolute: 0 10*3/uL (ref 0.0–0.5)
Eosinophils Relative: 0 %
HCT: 24.7 % — ABNORMAL LOW (ref 39.0–52.0)
Hemoglobin: 8.2 g/dL — ABNORMAL LOW (ref 13.0–17.0)
Immature Granulocytes: 0 %
Lymphocytes Relative: 82 %
Lymphs Abs: 0.1 10*3/uL — ABNORMAL LOW (ref 0.7–4.0)
MCH: 30.4 pg (ref 26.0–34.0)
MCHC: 33.2 g/dL (ref 30.0–36.0)
MCV: 91.5 fL (ref 80.0–100.0)
Monocytes Absolute: 0 10*3/uL — ABNORMAL LOW (ref 0.1–1.0)
Monocytes Relative: 9 %
Neutro Abs: 0 10*3/uL — CL (ref 1.7–7.7)
Neutrophils Relative %: 9 %
Platelet Count: 58 10*3/uL — ABNORMAL LOW (ref 150–400)
RBC: 2.7 MIL/uL — ABNORMAL LOW (ref 4.22–5.81)
RDW: 15.2 % (ref 11.5–15.5)
Smear Review: NORMAL
WBC Count: 0.1 10*3/uL — CL (ref 4.0–10.5)
nRBC: 0 % (ref 0.0–0.2)

## 2022-11-14 LAB — CMP (CANCER CENTER ONLY)
ALT: 31 U/L (ref 0–44)
AST: 13 U/L — ABNORMAL LOW (ref 15–41)
Albumin: 3.5 g/dL (ref 3.5–5.0)
Alkaline Phosphatase: 81 U/L (ref 38–126)
Anion gap: 7 (ref 5–15)
BUN: 18 mg/dL (ref 8–23)
CO2: 26 mmol/L (ref 22–32)
Calcium: 8.7 mg/dL — ABNORMAL LOW (ref 8.9–10.3)
Chloride: 104 mmol/L (ref 98–111)
Creatinine: 0.8 mg/dL (ref 0.61–1.24)
GFR, Estimated: >60 mL/min (ref >60)
Glucose, Bld: 95 mg/dL (ref 70–99)
Potassium: 3.9 mmol/L (ref 3.5–5.1)
Sodium: 137 mmol/L (ref 135–145)
Total Bilirubin: 1.7 mg/dL — ABNORMAL HIGH (ref 0.3–1.2)
Total Protein: 5.6 g/dL — ABNORMAL LOW (ref 6.5–8.1)

## 2022-11-14 LAB — MAGNESIUM: Magnesium: 1.5 mg/dL — ABNORMAL LOW (ref 1.7–2.4)

## 2022-11-15 ENCOUNTER — Other Ambulatory Visit: Payer: Self-pay

## 2022-11-16 ENCOUNTER — Other Ambulatory Visit: Payer: Self-pay

## 2022-11-16 ENCOUNTER — Inpatient Hospital Stay (HOSPITAL_COMMUNITY)
Admission: EM | Admit: 2022-11-16 | Discharge: 2022-11-17 | DRG: 871 | Disposition: A | Discharge status: Other Acute Inpatient Hospital | Payer: Medicare Other | Attending: Internal Medicine | Admitting: Internal Medicine

## 2022-11-16 ENCOUNTER — Emergency Department (HOSPITAL_COMMUNITY): Payer: Medicare Other

## 2022-11-16 DIAGNOSIS — J690 Pneumonitis due to inhalation of food and vomit: Secondary | ICD-10-CM | POA: Diagnosis present

## 2022-11-16 DIAGNOSIS — E861 Hypovolemia: Secondary | ICD-10-CM | POA: Diagnosis present

## 2022-11-16 DIAGNOSIS — E876 Hypokalemia: Secondary | ICD-10-CM | POA: Diagnosis present

## 2022-11-16 DIAGNOSIS — K1230 Oral mucositis (ulcerative), unspecified: Secondary | ICD-10-CM | POA: Diagnosis present

## 2022-11-16 DIAGNOSIS — T451X5A Adverse effect of antineoplastic and immunosuppressive drugs, initial encounter: Secondary | ICD-10-CM | POA: Diagnosis present

## 2022-11-16 DIAGNOSIS — Z9484 Stem cells transplant status: Secondary | ICD-10-CM

## 2022-11-16 DIAGNOSIS — Z923 Personal history of irradiation: Secondary | ICD-10-CM | POA: Diagnosis not present

## 2022-11-16 DIAGNOSIS — L8990 Pressure ulcer of unspecified site, unspecified stage: Secondary | ICD-10-CM | POA: Insufficient documentation

## 2022-11-16 DIAGNOSIS — A4152 Sepsis due to Pseudomonas: Secondary | ICD-10-CM | POA: Diagnosis present

## 2022-11-16 DIAGNOSIS — R350 Frequency of micturition: Secondary | ICD-10-CM | POA: Diagnosis not present

## 2022-11-16 DIAGNOSIS — Z9221 Personal history of antineoplastic chemotherapy: Secondary | ICD-10-CM

## 2022-11-16 DIAGNOSIS — R17 Unspecified jaundice: Secondary | ICD-10-CM | POA: Diagnosis not present

## 2022-11-16 DIAGNOSIS — D61818 Other pancytopenia: Secondary | ICD-10-CM | POA: Diagnosis not present

## 2022-11-16 DIAGNOSIS — D709 Neutropenia, unspecified: Principal | ICD-10-CM | POA: Diagnosis present

## 2022-11-16 DIAGNOSIS — Z8249 Family history of ischemic heart disease and other diseases of the circulatory system: Secondary | ICD-10-CM | POA: Diagnosis not present

## 2022-11-16 DIAGNOSIS — D61810 Antineoplastic chemotherapy induced pancytopenia: Secondary | ICD-10-CM | POA: Diagnosis present

## 2022-11-16 DIAGNOSIS — J189 Pneumonia, unspecified organism: Secondary | ICD-10-CM | POA: Diagnosis not present

## 2022-11-16 DIAGNOSIS — E871 Hypo-osmolality and hyponatremia: Secondary | ICD-10-CM | POA: Diagnosis present

## 2022-11-16 DIAGNOSIS — C9000 Multiple myeloma not having achieved remission: Secondary | ICD-10-CM | POA: Diagnosis present

## 2022-11-16 DIAGNOSIS — Z6824 Body mass index (BMI) 24.0-24.9, adult: Secondary | ICD-10-CM

## 2022-11-16 DIAGNOSIS — E43 Unspecified severe protein-calorie malnutrition: Secondary | ICD-10-CM | POA: Diagnosis present

## 2022-11-16 DIAGNOSIS — D696 Thrombocytopenia, unspecified: Secondary | ICD-10-CM | POA: Diagnosis not present

## 2022-11-16 DIAGNOSIS — I429 Cardiomyopathy, unspecified: Secondary | ICD-10-CM | POA: Diagnosis present

## 2022-11-16 DIAGNOSIS — G629 Polyneuropathy, unspecified: Secondary | ICD-10-CM | POA: Diagnosis present

## 2022-11-16 DIAGNOSIS — R5081 Fever presenting with conditions classified elsewhere: Secondary | ICD-10-CM | POA: Diagnosis present

## 2022-11-16 DIAGNOSIS — D701 Agranulocytosis secondary to cancer chemotherapy: Secondary | ICD-10-CM | POA: Diagnosis present

## 2022-11-16 DIAGNOSIS — R059 Cough, unspecified: Secondary | ICD-10-CM | POA: Diagnosis not present

## 2022-11-16 DIAGNOSIS — R6520 Severe sepsis without septic shock: Secondary | ICD-10-CM | POA: Diagnosis present

## 2022-11-16 DIAGNOSIS — Z1152 Encounter for screening for COVID-19: Secondary | ICD-10-CM

## 2022-11-16 DIAGNOSIS — R627 Adult failure to thrive: Secondary | ICD-10-CM | POA: Diagnosis present

## 2022-11-16 DIAGNOSIS — Z801 Family history of malignant neoplasm of trachea, bronchus and lung: Secondary | ICD-10-CM

## 2022-11-16 DIAGNOSIS — Z7982 Long term (current) use of aspirin: Secondary | ICD-10-CM | POA: Diagnosis not present

## 2022-11-16 DIAGNOSIS — R0602 Shortness of breath: Secondary | ICD-10-CM | POA: Diagnosis not present

## 2022-11-16 DIAGNOSIS — R509 Fever, unspecified: Secondary | ICD-10-CM | POA: Diagnosis not present

## 2022-11-16 LAB — URINALYSIS, W/ REFLEX TO CULTURE (INFECTION SUSPECTED)
Bacteria, UA: NONE SEEN
Bilirubin Urine: NEGATIVE
Glucose, UA: NEGATIVE mg/dL
Hgb urine dipstick: NEGATIVE
Ketones, ur: 20 mg/dL — AB
Leukocytes,Ua: NEGATIVE
Nitrite: NEGATIVE
Protein, ur: 30 mg/dL — AB
Specific Gravity, Urine: 1.015 (ref 1.005–1.030)
pH: 7 (ref 5.0–8.0)

## 2022-11-16 LAB — CBC WITH DIFFERENTIAL/PLATELET
HCT: 21.6 % — ABNORMAL LOW (ref 39.0–52.0)
Hemoglobin: 7.1 g/dL — ABNORMAL LOW (ref 13.0–17.0)
MCH: 29.7 pg (ref 26.0–34.0)
MCHC: 32.9 g/dL (ref 30.0–36.0)
MCV: 90.4 fL (ref 80.0–100.0)
Platelets: 20 10*3/uL — CL (ref 150–400)
RBC: 2.39 MIL/uL — ABNORMAL LOW (ref 4.22–5.81)
RDW: 15 % (ref 11.5–15.5)
WBC: 0.1 10*3/uL — CL (ref 4.0–10.5)
nRBC: 0 % (ref 0.0–0.2)

## 2022-11-16 LAB — COMPREHENSIVE METABOLIC PANEL
ALT: 35 U/L (ref 0–44)
AST: 19 U/L (ref 15–41)
Albumin: 3.1 g/dL — ABNORMAL LOW (ref 3.5–5.0)
Alkaline Phosphatase: 87 U/L (ref 38–126)
Anion gap: 11 (ref 5–15)
BUN: 14 mg/dL (ref 8–23)
CO2: 23 mmol/L (ref 22–32)
Calcium: 8.3 mg/dL — ABNORMAL LOW (ref 8.9–10.3)
Chloride: 97 mmol/L — ABNORMAL LOW (ref 98–111)
Creatinine, Ser: 0.95 mg/dL (ref 0.61–1.24)
GFR, Estimated: >60 mL/min (ref >60)
Glucose, Bld: 105 mg/dL — ABNORMAL HIGH (ref 70–99)
Potassium: 3.3 mmol/L — ABNORMAL LOW (ref 3.5–5.1)
Sodium: 131 mmol/L — ABNORMAL LOW (ref 135–145)
Total Bilirubin: 2.6 mg/dL — ABNORMAL HIGH (ref 0.3–1.2)
Total Protein: 5.7 g/dL — ABNORMAL LOW (ref 6.5–8.1)

## 2022-11-16 LAB — ABO/RH: ABO/RH(D): O POS

## 2022-11-16 LAB — HEMOGLOBIN AND HEMATOCRIT, BLOOD
HCT: 20.3 % — ABNORMAL LOW (ref 39.0–52.0)
Hemoglobin: 6.6 g/dL — CL (ref 13.0–17.0)

## 2022-11-16 LAB — RESP PANEL BY RT-PCR (RSV, FLU A&B, COVID)  RVPGX2
Influenza A by PCR: NEGATIVE
Influenza B by PCR: NEGATIVE
Resp Syncytial Virus by PCR: NEGATIVE
SARS Coronavirus 2 by RT PCR: NEGATIVE

## 2022-11-16 LAB — PREPARE RBC (CROSSMATCH)

## 2022-11-16 LAB — PROTIME-INR
INR: 1.3 — ABNORMAL HIGH (ref 0.8–1.2)
Prothrombin Time: 16.2 s — ABNORMAL HIGH (ref 11.4–15.2)

## 2022-11-16 LAB — APTT: aPTT: 33 s (ref 24–36)

## 2022-11-16 LAB — LACTIC ACID, PLASMA: Lactic Acid, Venous: 1.8 mmol/L (ref 0.5–1.9)

## 2022-11-16 LAB — I-STAT CG4 LACTIC ACID, ED: Lactic Acid, Venous: 1 mmol/L (ref 0.5–1.9)

## 2022-11-16 MED ORDER — SODIUM CHLORIDE 0.9% IV SOLUTION: Freq: Once | INTRAVENOUS | Status: AC

## 2022-11-16 MED ORDER — ONDANSETRON HCL 4 MG PO TABS: 4.0000 mg | ORAL_TABLET | Freq: Four times a day (QID) | ORAL | Status: DC | PRN

## 2022-11-16 MED ORDER — TRAZODONE HCL 50 MG PO TABS
25.0000 mg | ORAL_TABLET | Freq: Every evening | ORAL | Status: DC | PRN
  Administered 2022-11-16: 25 mg via ORAL
  Filled 2022-11-16: qty 1

## 2022-11-16 MED ORDER — LACTATED RINGERS IV BOLUS
1000.0000 mL | Freq: Once | INTRAVENOUS | Status: AC
  Administered 2022-11-16: 1000 mL via INTRAVENOUS

## 2022-11-16 MED ORDER — ALBUTEROL SULFATE (2.5 MG/3ML) 0.083% IN NEBU: 2.5000 mg | INHALATION_SOLUTION | RESPIRATORY_TRACT | Status: DC | PRN

## 2022-11-16 MED ORDER — LACTATED RINGERS IV BOLUS
500.0000 mL | Freq: Once | INTRAVENOUS | Status: AC
  Administered 2022-11-16: 500 mL via INTRAVENOUS

## 2022-11-16 MED ORDER — SODIUM CHLORIDE 0.9 % IV SOLN
2.0000 g | Freq: Once | INTRAVENOUS | Status: AC
  Administered 2022-11-16: 2 g via INTRAVENOUS
  Filled 2022-11-16: qty 12.5

## 2022-11-16 MED ORDER — LACTATED RINGERS IV BOLUS (SEPSIS)
1000.0000 mL | Freq: Once | INTRAVENOUS | Status: AC
  Administered 2022-11-16: 1000 mL via INTRAVENOUS

## 2022-11-16 MED ORDER — VANCOMYCIN HCL IN DEXTROSE 1-5 GM/200ML-% IV SOLN: 1000.0000 mg | Freq: Once | INTRAVENOUS | Status: DC

## 2022-11-16 MED ORDER — ACETAMINOPHEN 650 MG RE SUPP: 650.0000 mg | Freq: Four times a day (QID) | RECTAL | Status: DC | PRN

## 2022-11-16 MED ORDER — METRONIDAZOLE 500 MG/100ML IV SOLN
500.0000 mg | Freq: Once | INTRAVENOUS | Status: AC
  Administered 2022-11-16: 500 mg via INTRAVENOUS
  Filled 2022-11-16: qty 100

## 2022-11-16 MED ORDER — GABAPENTIN 300 MG PO CAPS
600.0000 mg | ORAL_CAPSULE | Freq: Two times a day (BID) | ORAL | Status: DC
  Administered 2022-11-16 (×2): 600 mg via ORAL
  Filled 2022-11-16 (×2): qty 2

## 2022-11-16 MED ORDER — MAGIC MOUTHWASH W/LIDOCAINE
5.0000 mL | Freq: Four times a day (QID) | ORAL | Status: DC
  Administered 2022-11-16 (×2): 5 mL via ORAL
  Filled 2022-11-16 (×4): qty 5

## 2022-11-16 MED ORDER — ONDANSETRON HCL 4 MG/2ML IJ SOLN
4.0000 mg | Freq: Four times a day (QID) | INTRAMUSCULAR | Status: DC | PRN
  Administered 2022-11-17: 4 mg via INTRAVENOUS
  Filled 2022-11-16: qty 2

## 2022-11-16 MED ORDER — VANCOMYCIN HCL 1750 MG/350ML IV SOLN
1750.0000 mg | Freq: Once | INTRAVENOUS | Status: AC
  Administered 2022-11-16: 1750 mg via INTRAVENOUS
  Filled 2022-11-16: qty 350

## 2022-11-16 MED ORDER — VANCOMYCIN HCL IN DEXTROSE 1-5 GM/200ML-% IV SOLN
1000.0000 mg | Freq: Two times a day (BID) | INTRAVENOUS | Status: DC
  Administered 2022-11-16: 1000 mg via INTRAVENOUS
  Filled 2022-11-16: qty 200

## 2022-11-16 MED ORDER — ACYCLOVIR 400 MG PO TABS
400.0000 mg | ORAL_TABLET | Freq: Every day | ORAL | Status: DC
  Administered 2022-11-16: 400 mg via ORAL
  Filled 2022-11-16: qty 1

## 2022-11-16 MED ORDER — LACTATED RINGERS IV SOLN: INTRAVENOUS | Status: AC

## 2022-11-16 MED ORDER — METRONIDAZOLE 500 MG/100ML IV SOLN
500.0000 mg | Freq: Two times a day (BID) | INTRAVENOUS | Status: DC
  Administered 2022-11-16: 500 mg via INTRAVENOUS
  Filled 2022-11-16 (×2): qty 100

## 2022-11-16 MED ORDER — ACETAMINOPHEN 325 MG PO TABS
650.0000 mg | ORAL_TABLET | Freq: Once | ORAL | Status: AC
  Administered 2022-11-16: 650 mg via ORAL
  Filled 2022-11-16: qty 2

## 2022-11-16 MED ORDER — SODIUM CHLORIDE 0.9 % IV SOLN
2.0000 g | Freq: Three times a day (TID) | INTRAVENOUS | Status: DC
  Administered 2022-11-16 – 2022-11-17 (×3): 2 g via INTRAVENOUS
  Filled 2022-11-16 (×3): qty 12.5

## 2022-11-16 MED ORDER — MIRTAZAPINE 15 MG PO TABS
15.0000 mg | ORAL_TABLET | Freq: Every day | ORAL | Status: DC
  Administered 2022-11-16: 15 mg via ORAL
  Filled 2022-11-16: qty 1
  Filled 2022-11-16: qty 2

## 2022-11-16 MED ORDER — ASPIRIN 81 MG PO TBEC
81.0000 mg | DELAYED_RELEASE_TABLET | Freq: Every day | ORAL | Status: DC
  Administered 2022-11-16: 81 mg via ORAL
  Filled 2022-11-16: qty 1

## 2022-11-16 MED ORDER — ACETAMINOPHEN 325 MG PO TABS
650.0000 mg | ORAL_TABLET | Freq: Four times a day (QID) | ORAL | Status: DC | PRN
  Administered 2022-11-16 – 2022-11-17 (×3): 650 mg via ORAL
  Filled 2022-11-16 (×3): qty 2

## 2022-11-16 NOTE — ED Triage Notes (Signed)
Pt is a current chemo pt. Has developed fevers that started yesterday Saturday. Home Temp 100.7. Took 500 mg tylenol at 3 am. Fever is associated with a cough and urinary frequency.

## 2022-11-16 NOTE — Progress Notes (Signed)
A consult was received from an ED physician for Cefepime + Vancomycin per pharmacy dosing.  The patient's profile has been reviewed for ht/wt/allergies/indication/available labs.   A one time order has been placed for Cefepime 2g & Vancomycin 1gm IV.  Further antibiotics/pharmacy consults should be ordered by admitting physician if indicated.                       Thank you, Douglass Rivers 11/16/2022  6:49 AM

## 2022-11-16 NOTE — Progress Notes (Signed)
Pharmacy Antibiotic Note  Alexander Saunders is a 72 y.o. male admitted on 11/16/2022 with  febrile neutropenia .  Pharmacy has been consulted for vancomycin and cefepime dosing.  Plan: - Start IV cefepime 2g q8hrs - Give IV vancomycin 1750mg  x1, followed by 1000mg  q12hrs (eAUC 492 using Scr 0.95, IBW, and Vd 0.72) - Vanc levels PRN - Flagyl per MD - Monitor cultures, renal function, and overall clinical status - De-escalate abx as able  Height: 6' (182.9 cm) Weight: 83.5 kg (184 lb) IBW/kg (Calculated) : 77.6  Temp (24hrs), Avg:101.2 F (38.4 C), Min:99.7 F (37.6 C), Max:102.2 F (39 C)  Recent Labs  Lab 11/14/22 1020 11/16/22 0553 11/16/22 0614  WBC 0.1* 0.1*  --   CREATININE 0.80 0.95  --   LATICACIDVEN  --   --  1.0    Estimated Creatinine Clearance: 76 mL/min (by C-G formula based on SCr of 0.95 mg/dL).    Allergies  Allergen Reactions   Jardiance [Empagliflozin] Other (See Comments)    UTI like symptoms    Antimicrobials this admission: 10/13 cefepime >>  10/13 vancomycin >>  10/13 Flagyl >>  Dose adjustments this admission: N/A  Microbiology results: 10/13 BCx: in process 10/13 UCx: not yet collected     Thank you for allowing pharmacy to be a part of this patient's care.  Cherylin Mylar, PharmD Clinical Pharmacist  10/13/20248:36 AM

## 2022-11-16 NOTE — Consult Note (Signed)
WOC Nurse Consult Note: Reason for Consult: pressure injuries Patient from home; neutropenic fevers. On chemo for multiple myeloma  Wound type:  Stage 2 Pressure Injury: left buttock, 0.4cm x 0.3cm x 0 and Stage 2 Pressure Injury coccyx: 1cm x 0.8cm x 0cm. However if open wound have at least 0.1cm opening  Pressure Injury POA: Yes Measurement:see above  Wound bed: see nursing flow sheets, clean, pink, moist Drainage (amount, consistency, odor) no drainage  Periwound: intact  Dressing procedure/placement/frequency: Continue nursing skin care order set using Silicone foam dressings to the buttock and coccyx wounds,  change every 3 days. ASSESS UNDER dressings each shift for any acute changes in the wounds.   Chair pressure redistribution pad for when up in chair and to be taken home with patient for use.    Re consult if needed, will not follow at this time. Thanks  Dinari Stgermaine [employer], RN,CWOCN, CNS, CWON-AP [phone])

## 2022-11-16 NOTE — H&P (Addendum)
History and Physical  Alexander Saunders:[id number] DOB: 28-Mar-1950 DOA: 11/16/2022  PCP: Jackie Plum, MD   Chief Complaint: fever   HPI: Alexander Saunders is a 72 y.o. male with medical history significant for multiple myeloma and multiple rounds of treatment including autologous stem cell transplant 2019 and had recent DCEP chemotherapy at [facility] and is now being admitted to [facility] with neutropenic fever.  Most recently he was receiving single agent monthly daratumumab but started experiencing failure to thrive, fatigue, etc. 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  He has been followed by Dr. Candise Che at [facility] as well as Dr. Anne Hahn at [facility].  He was admitted to [facility] for chemotherapy 9/30 to 10/6.  During his hospital stay, he had hypotension and show he is heart failure medications were discontinued.  He also had significant protein calorie malnutrition and weight loss, he wished to defer PEG placement per discharge summary.  He was started on Remeron 10/2.  Since discharge, he was also seen by outpatient cardiology who agreed to holding his antihypertensives.  He was treated for oral thrush, completed a course of fluconazole on 10/10.  He returned on 10/7 for PEG-filgrastim.  History is provided by the patient's wife and sister who are at the bedside with him in the ER this morning.  They state that initially after discharge from the hospital he was actually doing quite well, quite active.  5 to 6 days however, he has developed a dry cough, and urinary urgency and frequency.  No dysuria.  In the last 24 hours, he has started to have shaking chills and fever and so he was brought to the ER for evaluation.  ED Course: Here in the emergency department he has had a fever as high as 102.2, heart rate 144 initially now improved to 124.  Blood pressure 119/59, saturating well  on room air.  Lab work in the ER this morning shows pancytopenia.  Sodium 131, potassium 3.3, normal renal function, unremarkable LFTs.  Lactate 1.0.  Patient was given IV fluid bolus of 1 L, started on maintenance IV fluids.  He was given broad-spectrum IV antibiotics as noted below after blood cultures were obtained.  On my discussion with the patient's wife, she mentions that she wants the patient admitted to [facility].  She states that she already discussed this with the patient's nurse, as well as with the ER provider.  Currently Alexander Saunders complains only of severe fatigue, denies any pain, shortness of breath.  Review of Systems: Please see HPI for pertinent positives and negatives. A complete 10 system review of systems are otherwise negative.  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 He has never used smokeless tobacco. He reports current alcohol use. He reports that he does not use drugs.   Allergies  Allergen Reactions   Jardiance [Empagliflozin] Other (See Comments)    UTI like symptoms    Family History   Problem Relation Age of Onset   Hypertension Mother    Cancer Mother    Lung cancer Mother    Hypertension Father    Colon cancer Neg Hx    Colon polyps Neg Hx    Esophageal cancer Neg Hx    Rectal cancer Neg Hx    Stomach cancer Neg Hx      Prior to Admission medications   Medication Sig Start Date End Date Taking? Authorizing Provider  fluconazole (DIFLUCAN) 200 MG tablet Take by mouth. 11/08/22  Yes [provider]  mirtazapine (REMERON) 30 MG tablet Take by mouth. 11/07/22 11/07/23 Yes [provider]  acyclovir (ZOVIRAX) 400 MG tablet Take 1 tablet (400 mg total) by mouth daily. 03/31/22   Johney Maine, MD  aspirin EC 81 MG tablet Take 81 mg by mouth daily. 01/29/17   [provider]  B Complex Vitamins (B-COMPLEX/B-12) TABS Take 1 tablet by mouth daily.    [provider]  bisacodyl (DULCOLAX) 5 MG EC tablet Take 5 mg by mouth daily as needed for moderate constipation.    [provider]  carboxymethylcellulose (REFRESH PLUS) 0.5 % SOLN Apply 2 drops to eye daily as needed (as needed). 01/23/22   [provider]  carvedilol (COREG) 12.5 MG tablet TAKE 1 TABLET(12.5 MG) BY MOUTH TWICE DAILY Patient not taking: Reported on 10/28/2022 09/24/22   Runell Gess, MD  diphenhydramine-acetaminophen (TYLENOL PM EXTRA STRENGTH) 25-500 MG TABS tablet Take 1 tablet by mouth at bedtime as needed (pain).    [provider]  ENTRESTO 24-26 MG TAKE 1 TABLET BY MOUTH TWICE DAILY Patient not taking: Reported on 10/28/2022 10/24/22   Runell Gess, MD  gabapentin (NEURONTIN) 300 MG capsule TAKE 3 CAPSULES BY MOUTH THREE TIMES DAILY IN THE MORNING AND IN THE AFTERNOON AND AT NIGHT 04/22/22   Johney Maine, MD  Multiple Vitamin (MULTIVITAMIN) capsule Take 1 capsule by mouth daily.    [provider]  sildenafil (VIAGRA) 50 MG tablet daily as needed. Patient not taking: Reported on 10/28/2022 07/16/21   [provider]  sodium chloride (OCEAN) 0.65 % SOLN nasal spray Place 1 spray into both nostrils daily as needed for congestion.    [provider]  spironolactone (ALDACTONE) 25 MG tablet Take 1 tablet (25 mg total) by mouth daily. Patient not taking: Reported on 10/30/2022 12/05/21   Runell Gess, MD  tamsulosin (FLOMAX) 0.4 MG CAPS capsule Take 0.4 mg by mouth daily.    [provider]    Physical Exam: BP (!) 119/59 (BP Location: Right Arm)   Pulse (!) 124   Temp (!) 102.2 F (39 C) (Oral)   Resp 19   Ht 6' (1.829 m)   Wt 83.5 kg   SpO2 97%   BMI 24.95 kg/m   General:  Alert, oriented, calm, in no acute distress, emaciated, chronically ill-appearing Eyes: EOMI, clear conjuctivae, white sclerea  Neck: supple, no masses, trachea mildline  Cardiovascular: RRR, no murmurs or rubs, no peripheral edema  Respiratory: clear to auscultation bilaterally, no wheezes, no crackles  Abdomen: soft, nontender, nondistended, normal bowel tones heard  Skin: dry, no rashes, he has a small punctate pressure ulceration on his right buttocks, without any induration, tenderness, drainage, or surrounding erythema Musculoskeletal: no joint effusions, normal range of motion  Psychiatric: appropriate affect, normal speech  Neurologic: extraocular muscles intact, clear speech, moving all extremities with intact sensorium         Labs on Admission:  Basic Metabolic Panel: Recent Labs  Lab 11/14/22 1020 11/16/22 0553  NA 137 131*  K 3.9 3.3*  CL 104 97*  CO2 26 23  GLUCOSE 95 105*  BUN 18 14  CREATININE 0.80 0.95  CALCIUM 8.7* 8.3*  MG 1.5*  --    Liver Function Tests: Recent Labs  Lab 11/14/22 1020 11/16/22 0553  AST 13* 19  ALT 31 35  ALKPHOS 81 87  BILITOT 1.7* 2.6*  PROT 5.6* 5.7*  ALBUMIN 3.5 3.1*   No results for input(s): "LIPASE", "AMYLASE" in the last 168 hours. No results for input(s): "AMMONIA" in the last 168 hours. CBC: Recent Labs  Lab  11/14/22 1020 11/16/22 0553  WBC 0.1* 0.1*  NEUTROABS 0.0* HIDE  HGB 8.2* 7.1*  HCT 24.7* 21.6*  MCV 91.5 90.4  PLT 58* 20*   Cardiac Enzymes: No results for input(s): "CKTOTAL", "CKMB", "CKMBINDEX", "TROPONINI" in the last 168 hours.  BNP (last 3 results) Recent Labs    10/07/22 1215  BNP 297.2*    ProBNP (last 3 results) No results for input(s): "PROBNP" in the last 8760 hours.  CBG: No results for input(s): "GLUCAP" in the last 168 hours.  Radiological Exams on Admission: DG Chest 2 View  Result Date: 11/16/2022 CLINICAL DATA:  72 year old male with history of shortness of breath. EXAM: CHEST - 2 VIEW COMPARISON:  Chest x-ray 10/07/2022. FINDINGS: Lung volumes are normal. No consolidative airspace disease. No pleural effusions. No pneumothorax. No pulmonary nodule or mass noted. Pulmonary vasculature and the cardiomediastinal silhouette are within normal limits. IMPRESSION: No radiographic evidence of acute cardiopulmonary disease. Electronically Signed   By: Trudie Reed M.D.   On: 11/16/2022 07:09    Assessment/Plan Alexander Saunders is a 72 y.o. male with medical history significant for multiple myeloma and multiple rounds of treatment including autologous stem cell transplant 2019 and had recent DCEP chemotherapy at [facility] and is now being admitted to [facility] with neutropenic fever.   Severe sepsis-meeting criteria with fever, tachycardia and hypotension with SBP dropped greater than 40 mmHg.  Source is neutropenic fever from unclear source of infection. -Management of neutropenic fever as below -Will bolus another 1500 cc LR now, for total 30 cc/kg given developing hypotension  Neutropenic fever-in the setting of severe neutropenia due to recent chemotherapy.  He has some urinary urgency and frequency, but urinalysis is unremarkable.  He also has had a dry cough, without chest x-ray abnormality. -Inpatient admission to  progressive -Follow-up blood and urine cultures -Continue empiric IV vancomycin, IV cefepime, IV Flagyl -Discussed with Dr. Cherly Hensen oncology, will consult later today -[facility] contacted at family request, patient accepted by Dr. Alphonsa Gin however there are no beds and patient has been placed on wait list  Anemia-no signs or symptoms of bleeding, hemoglobin down to 7.1 was 8.2 on 10/11 -Transfuse 1 unit PRBC  Thrombocytopenia-Per oncology transfuse to  keep platelets greater than 20, or if bleeding -Dr. Cherly Hensen has ordered 1 unit platelets  Pancytopenia-due to recent DCEP chemotherapy  Sacral wound-does not appear infected, will consult wound care  DVT prophylaxis: SCDs only    Code Status: Full Code  Consults called: Dr. Cherly Hensen oncology  Admission status: The appropriate patient status for this patient is INPATIENT. Inpatient status is judged to be reasonable and necessary in order to provide the required intensity of service to ensure the patient's safety. The patient's presenting symptoms, physical exam findings, and initial radiographic and laboratory data in the context of their chronic comorbidities is felt to place them at high risk for further clinical deterioration. Furthermore, it is not anticipated that the patient will be medically stable for discharge from the hospital within 2 midnights of admission.    I certify that at the point of admission it is my clinical judgment that the patient will require inpatient hospital care spanning beyond 2 midnights from the point of admission due to high intensity of service, high risk for further deterioration and high frequency of surveillance required  Time spent: 59 minutes  Eris Breck Sharlette Dense MD [facility] Pager [phone]  If 7PM-7AM, please contact night-coverage [email or web address] Password [email or web address]  11/16/2022, 8:05 AM

## 2022-11-16 NOTE — Consult Note (Signed)
[facility] ADMISSION NOTE  Patient Care Team: Jackie Plum, MD as PCP - General (Internal Medicine) Runell Gess, MD as PCP - Cardiology (Cardiology)   ASSESSMENT & PLAN:  72 year old male with history of relapsed myeloma currently undergoing treatment consulted for neutropenic fever.  Patient completed DCEP on 10/1 at [facility].  He is now having pancytopenia as expected.  Clinical presentation consistent with UTI.  Blood cultures were drawn, respiratory panel.  Chest x-ray did not show acute process.  Urinalysis to be collected.  Patient received empiric antibiotics  Neutropenic fever Agree with panculture, clinically with UTI symptoms Antibiotics for gram-negative coverage Appreciate hospitalist management  Pancytopenia Profound neutropenia postchemotherapy as expected.  Patient received long-acting G-CSF.  Will continue to monitor daily Transfuse pRBC for hemoglobin less than 7 or symptomatic. Transfuse platelet for platelet less than 20 or bleeding, infection  Hypovolemia hyponatremia Increase fluid intake as able.  Goal of 60 to 70 ounces plus today.  Mucositis   All questions were answered.  Thank you for the consult Melven Sartorius, MD 11/16/2022 12:02 PM   CHIEF COMPLAINTS/PURPOSE OF ADMISSION Multiple myeloma with fever  HISTORY OF PRESENTING ILLNESS:  Alexander Saunders 72 y.o. male is admitted for neutropenic fever.  Patient has history of multiple myeloma with recent relapse undergoing treatment.  Initially diagnosed in 2018 received Cytoxan Velcade and dexamethasone followed by autologous transplant with melphalan in May 2019.  He achieved completed response.  Report of breath limit study in 03/2018 and discontinued 09/13/2020 because of worsening anemia.  Carfilzomib was started in January 2023 and stopped in March 2023 for presumed cardiomyopathy.  Daratumumab was started in January 2023 with dexamethasone.  Bone marrow biopsy from 10/2022  reported progression.  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  He saw DR. Anne Hahn at [facility] and received one cycle of DCEP at [facility] on 11/04/22.  He received PEG-filgrastim on 10/7.  Labs from [facility] on 10/6 showed WBC 5.9 hemoglobin 7.9 and platelet of 125.  On admission today WBC of 0.1 hemoglobin 7.1 and platelet of 20.  Reported fever of 102.2.  Tachycardic.  Blood pressure was 119/59 in the ED.  Patient reporting onset of hesitancy, urinary frequency, dysuria.  Report may have hematuria but not currently.  He denies history of increased frequency/symptoms until this last week.  His PCP started Flomax 2 days ago.  Summary of oncologic history as follows: Oncology History  Multiple myeloma (HCC)  09/18/2016 Initial Diagnosis   Multiple myeloma (HCC)   06/20/2020 Cancer Staging   Staging form: Plasma Cell Myeloma and Plasma Cell Disorders, AJCC 8th Edition - Clinical: No stage assigned - Signed by Benjiman Core, MD on 06/20/2020   02/19/2021 - 09/18/2021 Chemotherapy   Patient is on Treatment Plan : MYELOMA RELAPSED/REFRACTORY Carfilzomib (20/70) + Daratumumab SQ + Dexamethasone (20/40) DaraKd q28d     02/19/2021 - 09/17/2021 Chemotherapy   Patient is on Treatment Plan : MYELOMA RELAPSED/REFRACTORY Carfilzomib (20/70) + Daratumumab + Dexamethasone (20/40) (DaraKd) q28d     02/19/2021 -  Chemotherapy   Patient is on Treatment Plan : MYELOMA RELAPSED/REFRACTORY Carfilzomib (20/70) + Daratumumab SQ + Dexamethasone (20/40) DaraKd q28d       MEDICAL HISTORY:  Past Medical History:  Diagnosis Date   Anxiety    occasional    Blood transfusion without reported diagnosis 2018   with spinal surgery   History of chemotherapy    completed 06-17-2017   History of radiation therapy  completed 11-2016 per pt   Multiple myeloma (HCC) 2018   currently in remission    Neuromuscular disorder (HCC)    neuropathy lower extremeties, primarily right leg    Plasma cell  neoplasm 09/04/2016   Stem cells transplant status (HCC) 06/2017    SURGICAL HISTORY: Past Surgical History:  Procedure Laterality Date   APPENDECTOMY     BONE MARROW BIOPSY  07/2018   COLONOSCOPY     IR BONE MARROW BIOPSY & ASPIRATION  10/29/2022   KNEE ARTHROSCOPY     r/knee   LAMINECTOMY N/A 09/04/2016   Procedure: THORACIC FOUR-SIX LAMINECTOMY FOR RESECTION OF TUMOR;  Surgeon: Ditty, Loura Halt, MD;  Location: [facility] OR;  Service: Neurosurgery;  Laterality: N/A;    SOCIAL HISTORY: Social History   Socioeconomic History   Marital status: Married    Spouse name: Not on file   Number of children: Not on file   Years of education: Not on file   Highest education level: Not on file  Occupational History   Not on file  Tobacco Use   Smoking status: Never   Smokeless tobacco: Never  Vaping Use   Vaping status: Never Used  Substance and Sexual Activity   Alcohol use: Yes    Comment: occ   Drug use: Never   Sexual activity: Not on file  Other Topics Concern   Not on file  Social History Narrative   He worked as a Sports coach level of education:  college   He is back in college at [employer] studying exercise physiology.       Right handed      Single story home   Social Determinants of Health   Financial Resource Strain: Not on file  Food Insecurity: No Food Insecurity (11/16/2022)   Hunger Vital Sign    Worried About Running Out of Food in the Last Year: Never true    Ran Out of Food in the Last Year: Never true  Transportation Needs: No Transportation Needs (11/16/2022)   PRAPARE - Administrator, Civil Service (Medical): No    Lack of Transportation (Non-Medical): No  Physical Activity: Not on file  Stress: Not on file  Social Connections: Not on file  Intimate Partner Violence: Not At Risk (11/16/2022)   Humiliation, Afraid, Rape, and Kick questionnaire    Fear of Current or Ex-Partner: No    Emotionally Abused: No    Physically  Abused: No    Sexually Abused: No    FAMILY HISTORY: Family History  Problem Relation Age of Onset   Hypertension Mother    Cancer Mother    Lung cancer Mother    Hypertension Father    Colon cancer Neg Hx    Colon polyps Neg Hx    Esophageal cancer Neg Hx    Rectal cancer Neg Hx    Stomach cancer Neg Hx     ALLERGIES:  is allergic to jardiance [empagliflozin].  MEDICATIONS:  Current Facility-Administered Medications  Medication Dose Route Frequency Provider Last Rate Last Admin   0.9 %  sodium chloride infusion (Manually program via Guardrails IV Fluids)   Intravenous Once Kirby Crigler, Mir M, MD       acetaminophen (TYLENOL) tablet 650 mg  650 mg Oral Q6H PRN Kirby Crigler, Mir M, MD       Or   acetaminophen (TYLENOL) suppository 650 mg  650 mg Rectal Q6H PRN Kirby Crigler, Mir Judie Petit, MD       acyclovir (ZOVIRAX)  tablet 400 mg  400 mg Oral Daily Kirby Crigler, Mir M, MD   400 mg at 11/16/22 1113   albuterol (PROVENTIL) (2.5 MG/3ML) 0.083% nebulizer solution 2.5 mg  2.5 mg Nebulization Q2H PRN Maryln Gottron, MD       aspirin EC tablet 81 mg  81 mg Oral Daily Kirby Crigler, Mir M, MD   81 mg at 11/16/22 1113   ceFEPIme (MAXIPIME) 2 g in sodium chloride 0.9 % 100 mL IVPB  2 g Intravenous Q8H Davis, [place], RPH       gabapentin (NEURONTIN) capsule 600 mg  600 mg Oral BID Kirby Crigler, Mir M, MD   600 mg at 11/16/22 1113   lactated ringers bolus 500 mL  500 mL Intravenous Once Kirby Crigler, Mir M, MD       lactated ringers infusion   Intravenous Continuous Dione Booze, MD       metroNIDAZOLE (FLAGYL) IVPB 500 mg  500 mg Intravenous Q12H Kirby Crigler, Mir M, MD       mirtazapine (REMERON) tablet 15 mg  15 mg Oral QHS Kirby Crigler, Mir M, MD       ondansetron [facility]) tablet 4 mg  4 mg Oral Q6H PRN Kirby Crigler, Mir M, MD       Or   ondansetron [facility]) injection 4 mg  4 mg Intravenous Q6H PRN Kirby Crigler, Mir M, MD       traZODone (DESYREL) tablet 25 mg  25 mg Oral QHS PRN Kirby Crigler, Mir M, MD        vancomycin Luna Kitchens) IVPB 1750 mg/350 mL  1,750 mg Intravenous Once Cherylin Mylar, [facility] 175 mL/hr at 11/16/22 1103 1,750 mg at 11/16/22 1103   Followed by   vancomycin (VANCOCIN) IVPB 1000 mg/200 mL premix  1,000 mg Intravenous Q12H Cherylin Mylar, [facility]        REVIEW OF SYSTEMS:   Constitutional: + fevers no chills or sweats Ears, nose, mouth, throat, and face: Positive for mouth sore Respiratory: Denies cough, shortness of breath or wheezes Cardiovascular: Denies palpitation, chest discomfort or chest pain Gastrointestinal:  Denies nausea, vomiting, diarrhea, constipation, or abdominal pain GU: + any hesitancy, dysuria, frequency, hematuria Skin: Denies abnormal skin rashes Lymphatics: Denies new lymphadenopathy or mass All other systems were reviewed with the patient and are negative.  PHYSICAL EXAMINATION: ECOG PERFORMANCE STATUS: 1 - Symptomatic but completely ambulatory  Vitals:   11/16/22 0755 11/16/22 0930  BP: (!) 119/59 (!) 117/59  Pulse: (!) 124 (!) 144  Resp: 19 19  Temp: (!) 102.2 F (39 C) 100 F (37.8 C)  SpO2: 97% 98%   Filed Weights   11/16/22 0536 11/16/22 0931  Weight: 184 lb (83.5 kg) 177 lb 14.6 oz (80.7 kg)    GENERAL:alert, no distress and comfortable SKIN: skin color normal. No jaundice EYES: normal, conjunctiva normal, sclera clear OROPHARYNX: soures, moist NECK: supple. No mass LYMPH:  no palpable cervical lymphadenopathy LUNGS: clear to auscultation and normal breathing effort.  No wheeze or rales HEART: regular rate & rhythm and no murmurs ABDOMEN:abdomen soft, non-tender and normal bowel sounds Musculoskeletal:  no lower extremity edema NEURO: alert & oriented with fluent speech; no focal motor/sensory deficits   LABORATORY DATA:  I have reviewed the data as listed Lab Results  Component Value Date   WBC 0.1 (LL) 11/16/2022   HGB 7.1 (L) 11/16/2022   HCT 21.6 (L) 11/16/2022   MCV 90.4 11/16/2022   PLT 20 (LL) 11/16/2022   Recent  Labs    10/24/22 0853 10/28/22 1105  11/14/22 1020 11/16/22 0553  NA 138 140 137 131*  K 4.0 4.3 3.9 3.3*  CL 104 105 104 97*  CO2 21* 23 26 23   GLUCOSE 122* 128* 95 105*  BUN 27* 24* 18 14  CREATININE 1.27* 1.22 0.80 0.95  CALCIUM 10.2 9.9 8.7* 8.3*  GFRNONAA 60*  --  >60 >60  PROT 6.9 5.9* 5.6* 5.7*  ALBUMIN 4.1 3.9 3.5 3.1*  AST 22 17 13* 19  ALT 35 33 31 35  ALKPHOS 146* 139* 81 87  BILITOT 1.4* 1.4* 1.7* 2.6*    RADIOGRAPHIC STUDIES: I have personally reviewed the radiological images as listed and agreed with the findings in the report. DG Chest 2 View  Result Date: 11/16/2022 CLINICAL DATA:  72 year old male with history of shortness of breath. EXAM: CHEST - 2 VIEW COMPARISON:  Chest x-ray 10/07/2022. FINDINGS: Lung volumes are normal. No consolidative airspace disease. No pleural effusions. No pneumothorax. No pulmonary nodule or mass noted. Pulmonary vasculature and the cardiomediastinal silhouette are within normal limits. IMPRESSION: No radiographic evidence of acute cardiopulmonary disease. Electronically Signed   By: Trudie Reed M.D.   On: 11/16/2022 07:09   NM PET Image Restage (PS) Whole Body  Result Date: 11/03/2022 CLINICAL DATA:  Subsequent treatment strategy for multiple myeloma. EXAM: NUCLEAR MEDICINE PET WHOLE BODY TECHNIQUE: 9.4 mCi F-18 FDG was injected intravenously. Full-ring PET imaging was performed from the head to foot after the radiotracer. CT data was obtained and used for attenuation correction and anatomic localization. Fasting blood glucose: 132 mg/dl COMPARISON:  PET-CT 16/11/9602 FINDINGS: Mediastinal blood pool activity: SUV max 2.90 HEAD/NECK: No hypermetabolic activity in the scalp. No hypermetabolic cervical lymph nodes. Incidental CT findings: none CHEST: No hypermetabolic mediastinal or hilar nodes. No suspicious pulmonary nodules on the CT scan. Incidental CT findings: Stable cardiac enlargement, aortic and coronary artery calcifications.  ABDOMEN/PELVIS: No abnormal hypermetabolic activity within the liver, pancreas, adrenal glands, or spleen. No hypermetabolic lymph nodes in the abdomen or pelvis. Incidental CT findings: Stable scattered aortic and iliac artery calcifications. SKELETON: Overall the lytic bone disease is unchanged since 2022. I do not see any new or progressive findings on the CT scan. However, there are new scattered areas of hypermetabolism involving the axial and appendicular skeleton worrisome for active myeloma. Sternal activity has an SUV max of 3.02 and was previously 1.81. FDG uptake in the T10 vertebral body is 5.21 and was previously 2.64. Uptake in the L5 vertebral body is 4.90 and was previously 2.68. Uptake in the left sacrum is 3.75 and was previously 2.17. Right iliac bone uptake is 4.90 and was previously 2.40. Incidental CT findings: Persistent diffuse lytic myelomatous lesions. EXTREMITIES: Mild diffuse hypermetabolism in both femurs with SUV max of 3.27 Incidental CT findings: none IMPRESSION: 1. Overall stable lytic myelomatous lesions throughout the axial and appendicular skeleton without definite no or progressive changes by CT. However, new/progressive scattered areas of hypermetabolism involving the axial and appendicular skeleton worrisome for active myeloma. 2. No findings for extraosseous disease. Aortic Atherosclerosis (ICD10-I70.0). Electronically Signed   By: Rudie Meyer M.D.   On: 11/03/2022 10:54   IR BONE MARROW BIOPSY & ASPIRATION  Result Date: 10/29/2022 INDICATION: 72 year old male referred for bone marrow biopsy EXAM: IR BONE MARRO BIOPY AND ASPIRATION MEDICATIONS: None. ANESTHESIA/SEDATION: Moderate (conscious) sedation was employed during this procedure. A total of Versed 1 mg and Fentanyl 50 mcg was administered intravenously. Moderate Sedation Time: 12 minutes. The patient's level of consciousness and vital signs were  monitored continuously by radiology nursing throughout the procedure  under my direct supervision. FLUOROSCOPY TIME:  Fluoroscopy Time:   (8 mGy). COMPLICATIONS: None PROCEDURE: Informed written consent was obtained from the patient after a thorough discussion of the procedural risks, benefits and alternatives. All questions were addressed. Maximal Sterile Barrier Technique was utilized including caps, mask, sterile gowns, sterile gloves, sterile drape, hand hygiene and skin antiseptic. A timeout was performed prior to the initiation of the procedure. The procedure risks, benefits, and alternatives were explained to the patient. Questions regarding the procedure were encouraged and answered. The patient understands and consents to the procedure. Patient was positioned prone under the image intensifier. Physical exam was used to determine the L5- S1 level, and then the posterior pelvis was prepped with Chlorhexidine in a sterile fashion. A sterile drape was applied covering the operative field. Sterile gown/sterile gloves were used for the procedure. Local anesthesia was provided with 1% Lidocaine. Posterior right iliac bone was targeted for biopsy. The skin and subcutaneous tissues were infiltrated with 1% lidocaine without epinephrine. A small stab incision was made with an 11 blade scalpel, and an 11 gauge Murphy needle was advanced with fluoro guidance to the posterior cortex. Manual forced was used to advance the needle through the posterior cortex and the stylet was removed. A bone marrow aspirate was retrieved and passed to a cytotechnologist in the room. The Murphy needle was then advanced without the stylet for a core biopsy. The core biopsy was retrieved and also passed to a cytotechnologist. Manual pressure was used for hemostasis and a sterile dressing was placed. No complications were encountered no significant blood loss was encountered. Patient tolerated the procedure well and remained hemodynamically stable throughout. IMPRESSION: Status post image guided bone marrow  biopsy. Signed, Yvone Neu. Miachel Roux, RPVI [facility] Radiology Electronically Signed   By: Gilmer Mor D.O.   On: 10/29/2022 10:32

## 2022-11-16 NOTE — Plan of Care (Signed)
?  Problem: Clinical Measurements: ?Goal: Ability to maintain clinical measurements within normal limits will improve ?Outcome: Not Progressing ?  ?

## 2022-11-16 NOTE — Progress Notes (Addendum)
   11/16/22 1830  Assess: MEWS Score  Temp (!) 101.1 F (38.4 C)  BP 121/65  Pulse Rate (!) 135  ECG Heart Rate (!) 135  Resp 18  SpO2 96 %  O2 Device Room Air  Assess: MEWS Score  MEWS Temp 1  MEWS Systolic 0  MEWS Pulse 3  MEWS RR 0  MEWS LOC 0  MEWS Score 4  MEWS Score Color Red  Assess: if the MEWS score is Yellow or Red  Were vital signs accurate and taken at a resting state? Yes  Does the patient meet 2 or more of the SIRS criteria? Yes  Does the patient have a confirmed or suspected source of infection? No  MEWS guidelines implemented  Yes, red  Treat  MEWS Interventions Considered administering scheduled or prn medications/treatments as ordered  Take Vital Signs  Increase Vital Sign Frequency  Red: Q1hr x2, continue Q4hrs until patient remains green for 12hrs  Escalate  MEWS: Escalate Red: Discuss with charge nurse and notify provider. Consider notifying RRT. If remains red for 2 hours consider need for higher level of care  Notify: Charge Nurse/RN  Name of Charge Nurse/RN Notified Doreatha Lew RN  Provider Notification  Provider Name/Title Kirby Crigler  Date Provider Notified 11/16/22  Time Provider Notified 1850  Method of Notification Page  Notification Reason Other (Comment) (elevated temp 101.1, escalted MEWS to red)  Assess: SIRS CRITERIA  SIRS Temperature  1  SIRS Pulse 1  SIRS Respirations  0  SIRS WBC 0  SIRS Score Sum  2   VS were entered under incorrecrt time, VS taken at 1844

## 2022-11-16 NOTE — ED Provider Notes (Addendum)
[place] [facility]ENC[facility] DE[facility]MENT AT [facility] Provider Note   CSN: [id number] Arrival date & time: 11/16/22  0532     History  Chief Complaint  Patient presents with   Fever    Alexander Saunders is a 72 y.o. male.  The history is provided by the patient.  Fever He has history of multiple myeloma with recent relapse and comes in because of fever today as high as 100.7.  For the last 24 hours, he has had some chills and sweats and he has been having urinary frequency and urgency over the last 3 days.  He had seen his primary care provider who checked a urinalysis which showed no signs of infection.  Received chemotherapy recently for his myeloma.  He has had a minimal cough, also complains of a sore throat.  There has been no nausea or vomiting.   Home Medications Prior to Admission medications   Medication Sig Start Date End Date Taking? Authorizing Provider  acyclovir (ZOVIRAX) 400 MG tablet Take 1 tablet (400 mg total) by mouth daily. 03/31/22   Johney Maine, MD  aspirin EC 81 MG tablet Take 81 mg by mouth daily. 01/29/17   [provider]  B Complex Vitamins (B-COMPLEX/B-12) TABS Take 1 tablet by mouth daily.    [provider]  bisacodyl (DULCOLAX) 5 MG EC tablet Take 5 mg by mouth daily as needed for moderate constipation.    [provider]  carboxymethylcellulose (REFRESH PLUS) 0.5 % SOLN Apply 2 drops to eye daily as needed (as needed). 01/23/22   [provider]  carvedilol (COREG) 12.5 MG tablet TAKE 1 TABLET(12.5 MG) BY MOUTH TWICE DAILY Patient not taking: Reported on 10/28/2022 09/24/22   Runell Gess, MD  diphenhydramine-acetaminophen (TYLENOL PM EXTRA STRENGTH) 25-500 MG TABS tablet Take 1 tablet by mouth at bedtime as needed (pain).    [provider]  ENTRESTO 24-26 MG TAKE 1 TABLET BY MOUTH TWICE DAILY Patient not taking: Reported on 10/28/2022 10/24/22   Runell Gess, MD  gabapentin (NEURONTIN)  300 MG capsule TAKE 3 CAPSULES BY MOUTH THREE TIMES DAILY IN THE MORNING AND IN THE AFTERNOON AND AT NIGHT 04/22/22   Johney Maine, MD  Multiple Vitamin (MULTIVITAMIN) capsule Take 1 capsule by mouth daily.    [provider]  sildenafil (VIAGRA) 50 MG tablet daily as needed. Patient not taking: Reported on 10/28/2022 07/16/21   [provider]  sodium chloride (OCEAN) 0.65 % SOLN nasal spray Place 1 spray into both nostrils daily as needed for congestion.    [provider]  spironolactone (ALDACTONE) 25 MG tablet Take 1 tablet (25 mg total) by mouth daily. Patient not taking: Reported on 10/30/2022 12/05/21   Runell Gess, MD      Allergies    Jardiance [empagliflozin]    Review of Systems   Review of Systems  Constitutional:  Positive for fever.  All other systems reviewed and are negative.   Physical Exam Updated Vital Signs BP 132/70   Pulse (!) 144   Temp (!) 101.6 F (38.7 C) (Oral)   Resp (!) 24   Ht 6' (1.829 m)   Wt 83.5 kg   SpO2 100%   BMI 24.95 kg/m  Physical Exam Vitals and nursing note reviewed.   72 year old male, resting comfortably and in no acute distress. Vital signs are significant for elevated temperature, respiratory rate, and heart rate. Oxygen saturation is 100%, which is normal.  He is nontoxic in appearance. Head is normocephalic and atraumatic. PERRLA, EOMI. Oropharynx is clear. Neck is nontender and supple without adenopathy. Back is nontender and there is no CVA tenderness. Lungs are clear without rales, wheezes, or rhonchi. Chest is nontender. Heart has regular rate and rhythm without murmur. Abdomen is soft, flat, nontender. Extremities have no cyanosis or edema, full range of motion is present. Skin is warm and dry without rash. Neurologic: Mental status is normal, cranial nerves are intact, moves all extremities equally.  ED Results / Procedures / Treatments   Labs (all labs ordered are listed, but  only abnormal results are displayed) Labs Reviewed  URINALYSIS, W/ REFLEX TO CULTURE (INFECTION SUSPECTED) - Abnormal; Notable for the following components:      Result Value   Ketones, ur 20 (*)    Protein, ur 30 (*)    All other components within normal limits  RESP PANEL BY RT-PCR (RSV, FLU A&B, COVID)  [id number]  CULTURE, BLOOD (ROUTINE X 2)  CULTURE, BLOOD (ROUTINE X 2)  URINE CULTURE  COMPREHENSIVE METABOLIC PANEL  CBC WITH DIFFERENTIAL/PLATELET  PROTIME-INR  APTT  URINALYSIS, ROUTINE W REFLEX MICROSCOPIC  I-STAT CG4 LACTIC ACID, ED    EKG EKG Interpretation Date/Time:  Sunday November 16 2022 05:48:19 EDT Ventricular Rate:  142 PR Interval:    QRS Duration:  88 QT Interval:  280 QTC Calculation: 431 R Axis:   43  Text Interpretation: Atrial flutter with varied AV block, LVH with secondary repolarization abnormality Probable inferior infarct, recent When compared with ECG of 10/22/2022, Atrial flutter has replaced Sinus rhythm REPOLARIZATION ABNORMALITY is now present , likely rate-related Confirmed by Dione Booze ([id number]) on 11/16/2022 6:07:55 AM  Radiology No results found.  Procedures Procedures  Cardiac monitor shows sinus tachycardia, per my interpretation.  Medications Ordered in ED Medications  lactated ringers bolus 1,000 mL (has no administration in time range)  acetaminophen (TYLENOL) tablet 650 mg (has no administration in time range)  lactated ringers infusion (has no administration in time range)  ceFEPIme (MAXIPIME) 2 g in sodium chloride 0.9 % 100 mL IVPB (has no administration in time range)  metroNIDAZOLE (FLAGYL) IVPB 500 mg (has no administration in time range)  vancomycin (VANCOCIN) IVPB 1000 mg/200 mL premix (has no administration in time range)    ED Course/ Medical Decision Making/ A&P                                 Medical Decision Making Amount and/or Complexity of Data Reviewed Labs: ordered. Radiology: ordered.  Risk OTC  drugs. Prescription drug management. Decision regarding hospitalization.   Fever and patient who has had recent chemotherapy, question leukopenia.  Also, concern for sepsis with significant tachycardia but no hypotension.  I have reviewed his past records, and note hospitalization for emergent chemotherapy, treated with cyclophosphamide, etoposide, cisplatin 10/1-10/4, infusion on 11/10/2022 of pegfilgrastim.  On 10/6, WBC was 5.9.  On 11/14/2022, WBC was 0.1 with 9% neutrophils.  Urinalysis today showed mild ketonuria and proteinuria but no pyuria or bacteriuria.  Unfortunately, with leukopenia, absence of WBCs in urine cannot be used to exclude UTI, so I have sent urine for culture in spite of lack of cells.  Because of leukopenia, I have placed the patient in the evolving sepsis pathway.  I have reviewed her initial laboratory tests and my interpretation is mild hyponatremia which is not felt to be clinically significant, mild hypokalemia and  I have ordered a dose of oral potassium, elevated random glucose level slightly higher than recent values.  I have ordered antibiotics for sepsis of undetermined cause.  Initial lactic acid level was normal.  He will need to be admitted once CBC has resulted.  I have reviewed his electrocardiogram, and my interpretation is sinus tachycardia with borderline repolarization abnormality which is new compared with prior ECG and felt to be likely rate related.  I have ordered IV fluids because of tachycardia.  I have reviewed his CBC, and my interpretation is severe pancytopenia with WBC 0.1, hemoglobin 7.1, platelets 20 with hemoglobin and platelets having dropped compared with 11/14/2022.  Respiratory pathogen panel is negative for influenza, RSV, COVID-19.  I have discussed the case with Dr. Kirby Crigler of [facility], who agrees to admit the patient.  I had extensive discussion with family members indicating the severity of his infection and the setting of  neutropenia.  CRITICAL CARE Performed by: Dione Booze Total critical care time: 60 minutes Critical care time was exclusive of separately billable procedures and treating other patients. Critical care was necessary to treat or prevent imminent or life-threatening deterioration. Critical care was time spent personally by me on the following activities: development of treatment plan with patient and/or surrogate as well as nursing, discussions with consultants, evaluation of patient's response to treatment, examination of patient, obtaining history from patient or surrogate, ordering and performing treatments and interventions, ordering and review of laboratory studies, ordering and review of radiographic studies, pulse oximetry and re-evaluation of patient's condition.  Final Clinical Impression(s) / ED Diagnoses Final diagnoses:  Neutropenic fever (HCC)  Pancytopenia (HCC)  Hyponatremia  Hypokalemia  Serum total bilirubin elevated  Urinary frequency    Rx / DC Orders ED Discharge Orders     None         Dione Booze, MD 11/16/22 9562    Dione Booze, MD 11/16/22 2352

## 2022-11-16 NOTE — Progress Notes (Signed)
Pt being followed by [facility] for Sepsis protocol. 

## 2022-11-16 NOTE — Progress Notes (Signed)
   11/16/22 0930  Assess: MEWS Score  Temp 100 F (37.8 C)  BP (!) 117/59  MAP (mmHg) 76  Pulse Rate (!) 144  Resp 19  SpO2 98 %  Assess: MEWS Score  MEWS Temp 0  MEWS Systolic 0  MEWS Pulse 3  MEWS RR 0  MEWS LOC 0  MEWS Score 3  MEWS Score Color Yellow  Assess: if the MEWS score is Yellow or Red  Were vital signs accurate and taken at a resting state? Yes  Does the patient meet 2 or more of the SIRS criteria? No  MEWS guidelines implemented  Yes, yellow  Treat  MEWS Interventions Considered administering scheduled or prn medications/treatments as ordered  Take Vital Signs  Increase Vital Sign Frequency  Yellow: Q2hr x1, continue Q4hrs until patient remains green for 12hrs  Escalate  MEWS: Escalate Yellow: Discuss with charge nurse and consider notifying provider and/or RRT  Notify: Charge Nurse/RN  Name of Charge Nurse/RN Notified Doreatha Lew RN  Provider Notification  Provider Name/Title Kirby Crigler, MD  Date Provider Notified 11/16/22  Time Provider Notified 1049  Method of Notification Page  Notification Reason Other (Comment) (Yellow MEWS protocol initiated on unit, previously Yellow MEWS in ED as well)  Provider response No new orders  Date of Provider Response 11/16/22  Time of Provider Response 1051  Assess: SIRS CRITERIA  SIRS Temperature  0  SIRS Pulse 1  SIRS Respirations  0  SIRS WBC 0  SIRS Score Sum  1

## 2022-11-16 NOTE — ED Notes (Signed)
ED TO INPATIENT HANDOFF REPORT  Name/Age/Gender Alexander Saunders 72 y.o. male  Code Status    Code Status Orders  (From admission, onward)           Start     Ordered   11/16/22 0802  Full code  Continuous       Question:  By:  Answer:  Consent: discussion documented in EHR   11/16/22 0803           Code Status History     Date Active Date Inactive Code Status Order ID Comments User Context   10/29/2022 1006 10/30/2022 0506 Full Code [id number]  Gilmer Mor, DO HOV   09/02/2016 2200 09/06/2016 2257 Full Code [id number]  Clydie Braun, MD ED      Advance Directive Documentation    Flowsheet Row Most Recent Value  Type of Advance Directive Healthcare Power of Attorney  Pre-existing out of facility DNR order (yellow form or pink MOST form) --  "MOST" Form in Place? --       Home/SNF/Other Home  Chief Complaint Neutropenic fever (HCC) [D70.9, R50.81]  Level of Care/Admitting Diagnosis ED Disposition     ED Disposition  Admit   Condition  --   Comment  Hospital [facility] [[id number]]  Level of Care: Progressive [102]  Admit to Progressive based on following criteria: MULTISYSTEM THREATS such as stable sepsis, metabolic/electrolyte imbalance with or without encephalopathy that is responding to early treatment.  May admit patient to [facility] or [facility] if equivalent level of care is available:: Yes  Covid Evaluation: Asymptomatic - no recent exposure (last 10 days) testing not required  Diagnosis: Neutropenic fever [facility]) [[id number]]  Admitting Physician: Maryln Gottron [[id number]]  Attending Physician: Kirby Crigler, MIR Jaxson.Roy [[id number]]  Certification:: I certify this patient will need inpatient services for at least 2 midnights  Expected Medical Readiness: 11/18/2022          Medical History Past Medical History:  Diagnosis Date   Anxiety    occasional    Blood transfusion without reported diagnosis 2018   with spinal surgery    History of chemotherapy    completed 06-17-2017   History of radiation therapy    completed 11-2016 per pt   Multiple myeloma (HCC) 2018   currently in remission    Neuromuscular disorder (HCC)    neuropathy lower extremeties, primarily right leg    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   Stem cells transplant status (HCC) 06/2017    Allergies Allergies  Allergen Reactions   Jardiance [Empagliflozin] Other (See Comments)    UTI like symptoms    IV Location/Drains/Wounds Patient Lines/Drains/Airways Status     Active Line/Drains/Airways     Name Placement date Placement time Site Days   Peripheral IV 11/16/22 20 G Right Antecubital 11/16/22  0558  Antecubital  less than 1   Peripheral IV 11/16/22 20 G Left Antecubital 11/16/22  0639  Antecubital  less than 1            Labs/Imaging Results for orders placed or performed during the hospital encounter of 11/16/22 (from the past 48 hour(s))  Comprehensive metabolic panel     Status: Abnormal   Collection Time: 11/16/22  5:53 AM  Result Value Ref Range   Sodium 131 (L) 135 - 145 mmol/L   Potassium 3.3 (L) 3.5 - 5.1 mmol/L   Chloride 97 (L) 98 - 111 mmol/L   CO2 23 22 - 32 mmol/L   Glucose,  Bld 105 (H) 70 - 99 mg/dL    Comment: Glucose reference range applies only to samples taken after fasting for at least 8 hours.   BUN 14 8 - 23 mg/dL   Creatinine, Ser 3.08 0.61 - 1.24 mg/dL   Calcium 8.3 (L) 8.9 - 10.3 mg/dL   Total Protein 5.7 (L) 6.5 - 8.1 g/dL   Albumin 3.1 (L) 3.5 - 5.0 g/dL   AST 19 15 - 41 U/L   ALT 35 0 - 44 U/L   Alkaline Phosphatase 87 38 - 126 U/L   Total Bilirubin 2.6 (H) 0.3 - 1.2 mg/dL   GFR, Estimated >65 >78 mL/min    Comment: (NOTE) Calculated using the CKD-EPI Creatinine Equation (2021)    Anion gap 11 5 - 15    Comment: Performed at [facility], [address] [place], [place] [address]  CBC with Differential     Status: Abnormal   Collection Time: 11/16/22  5:53 AM   Result Value Ref Range   WBC 0.1 (LL) 4.0 - 10.5 K/uL    Comment: This critical result has verified and been called to Glynn Octave RN by Shauna Hugh on 10 13 2024 at 0703, and has been read back.    RBC 2.39 (L) 4.22 - 5.81 MIL/uL   Hemoglobin 7.1 (L) 13.0 - 17.0 g/dL   HCT 95.2 (L) 84.1 - 32.4 %   MCV 90.4 80.0 - 100.0 fL   MCH 29.7 26.0 - 34.0 pg   MCHC 32.9 30.0 - 36.0 g/dL   RDW 40.1 02.7 - 25.3 %   Platelets 20 (LL) 150 - 400 K/uL    Comment: Immature Platelet Fraction may be clinically indicated, consider ordering this additional test [id number] THIS CRITICAL RESULT HAS VERIFIED AND BEEN CALLED TO Glynn Octave RN BY XIONG,KONG ON 10 13 2024 AT 0703, AND HAS BEEN READ BACK.     nRBC 0.0 0.0 - 0.2 %   Neutrophils Relative % TOO FEW TO COUNT, SMEAR AVAILABLE FOR REVIEW %   Neutro Abs HIDE 1.7 - 7.7 K/uL    Comment: Performed at [facility], [address], [place], [place] [address]  Resp panel by RT-PCR (RSV, Flu A&B, Covid) Anterior Nasal Swab     Status: None   Collection Time: 11/16/22  5:55 AM   Specimen: Anterior Nasal Swab  Result Value Ref Range   SARS Coronavirus 2 by RT PCR NEGATIVE NEGATIVE    Comment: (NOTE) SARS-CoV-2 target nucleic acids are NOT DETECTED.  The SARS-CoV-2 RNA is generally detectable in upper respiratory specimens during the acute phase of infection. The lowest concentration of SARS-CoV-2 viral copies this assay can detect is 138 copies/mL. A negative result does not preclude SARS-Cov-2 infection and should not be used as the sole basis for treatment or other patient management decisions. A negative result may occur with  improper specimen collection/handling, submission of specimen other than nasopharyngeal swab, presence of viral mutation(s) within the areas targeted by this assay, and inadequate number of viral copies(<138 copies/mL). A negative result must be combined with clinical observations, patient history, and  epidemiological information. The expected result is Negative.  Fact Sheet for Patients:  BloggerCourse.com  Fact Sheet for Healthcare Providers:  SeriousBroker.it  This test is no t yet approved or cleared by the [place] FDA and  has been authorized for detection and/or diagnosis of SARS-CoV-2 by FDA under an Emergency Use Authorization (EUA). This EUA will remain  in effect (meaning  this test can be used) for the duration of the COVID-19 declaration under Section 564(b)(1) of the Act, 21 U.S.C.section 360bbb-3(b)(1), unless the authorization is terminated  or revoked sooner.       Influenza A by PCR NEGATIVE NEGATIVE   Influenza B by PCR NEGATIVE NEGATIVE    Comment: (NOTE) The Xpert Xpress SARS-CoV-2/FLU/RSV plus assay is intended as an aid in the diagnosis of influenza from Nasopharyngeal swab specimens and should not be used as a sole basis for treatment. Nasal washings and aspirates are unacceptable for Xpert Xpress SARS-CoV-2/FLU/RSV testing.  Fact Sheet for Patients: BloggerCourse.com  Fact Sheet for Healthcare Providers: SeriousBroker.it  This test is not yet approved or cleared by the [place] FDA and has been authorized for detection and/or diagnosis of SARS-CoV-2 by FDA under an Emergency Use Authorization (EUA). This EUA will remain in effect (meaning this test can be used) for the duration of the COVID-19 declaration under Section 564(b)(1) of the Act, 21 U.S.C. section 360bbb-3(b)(1), unless the authorization is terminated or revoked.     Resp Syncytial Virus by PCR NEGATIVE NEGATIVE    Comment: (NOTE) Fact Sheet for Patients: BloggerCourse.com  Fact Sheet for Healthcare Providers: SeriousBroker.it  This test is not yet approved or cleared by the [place] FDA and has been authorized for  detection and/or diagnosis of SARS-CoV-2 by FDA under an Emergency Use Authorization (EUA). This EUA will remain in effect (meaning this test can be used) for the duration of the COVID-19 declaration under Section 564(b)(1) of the Act, 21 U.S.C. section 360bbb-3(b)(1), unless the authorization is terminated or revoked.  Performed at [facility], [address] [place], [place] [address]   Protime-INR     Status: Abnormal   Collection Time: 11/16/22  5:55 AM  Result Value Ref Range   Prothrombin Time 16.2 (H) 11.4 - 15.2 seconds   INR 1.3 (H) 0.8 - 1.2    Comment: (NOTE) INR goal varies based on device and disease states. Performed at [facility], [address], [place], [place] [address]   APTT     Status: None   Collection Time: 11/16/22  5:55 AM  Result Value Ref Range   aPTT 33 24 - 36 seconds    Comment: Performed at [facility], [address] [place], [place] [address]  Urinalysis, w/ Reflex to Culture (Infection Suspected) -Urine, Clean Catch     Status: Abnormal   Collection Time: 11/16/22  6:05 AM  Result Value Ref Range   Specimen Source URINE, CLEAN CATCH    Color, Urine YELLOW YELLOW   APPearance CLEAR CLEAR   Specific Gravity, Urine 1.015 1.005 - 1.030   pH 7.0 5.0 - 8.0   Glucose, UA NEGATIVE NEGATIVE mg/dL   Hgb urine dipstick NEGATIVE NEGATIVE   Bilirubin Urine NEGATIVE NEGATIVE   Ketones, ur 20 (A) NEGATIVE mg/dL   Protein, ur 30 (A) NEGATIVE mg/dL   Nitrite NEGATIVE NEGATIVE   Leukocytes,Ua NEGATIVE NEGATIVE   RBC / HPF 0-5 0 - 5 RBC/hpf   WBC, UA 0-5 0 - 5 WBC/hpf    Comment:        Reflex urine culture not performed if WBC <=10, OR if Squamous epithelial cells >5. If Squamous epithelial cells >5 suggest recollection.    Bacteria, UA NONE SEEN NONE SEEN   Squamous Epithelial / HPF 0-5 0 - 5 /HPF   Mucus PRESENT     Comment: Performed at [facility],  [address] [place], [place] [address]  I-Stat Lactic Acid, ED     Status: None   Collection Time: 11/16/22  6:14 AM  Result Value Ref Range   Lactic Acid, Venous 1.0 0.5 - 1.9 mmol/L   DG Chest 2 View  Result Date: 11/16/2022 CLINICAL DATA:  72 year old male with history of shortness of breath. EXAM: CHEST - 2 VIEW COMPARISON:  Chest x-ray 10/07/2022. FINDINGS: Lung volumes are normal. No consolidative airspace disease. No pleural effusions. No pneumothorax. No pulmonary nodule or mass noted. Pulmonary vasculature and the cardiomediastinal silhouette are within normal limits. IMPRESSION: No radiographic evidence of acute cardiopulmonary disease. Electronically Signed   By: Trudie Reed M.D.   On: 11/16/2022 07:09    Pending Labs Unresulted Labs (From admission, onward)     Start     Ordered   11/17/22 0500  Basic metabolic panel  Tomorrow morning,   R        11/16/22 0803   11/17/22 0500  CBC  Tomorrow morning,   R        11/16/22 0803   11/16/22 0627  Urine Culture  Once,   URGENT       Question:  Indication  Answer:  Sepsis   11/16/22 0626   11/16/22 0626  Urinalysis, Routine w reflex microscopic -Urine, Clean Catch  Once,   URGENT       Question:  Specimen Source  Answer:  Urine, Clean Catch   11/16/22 0626   11/16/22 0624  Blood Culture (routine x 2)  (Undifferentiated presentation (screening labs and basic nursing orders))  BLOOD CULTURE X 2,   STAT      11/16/22 0626            Vitals/Pain Today's Vitals   11/16/22 0550 11/16/22 0628 11/16/22 0705 11/16/22 0755  BP:    (!) 119/59  Pulse:    (!) 124  Resp: (!) 24   19  Temp:   (!) 101.6 F (38.7 C) (!) 102.2 F (39 C)  TempSrc:   Oral Oral  SpO2:  100%  97%  Weight:      Height:      PainSc:        Isolation Precautions No active isolations  Medications Medications  lactated ringers infusion (has no administration in time range)  vancomycin (VANCOCIN) IVPB 1000 mg/200 mL premix (has no administration in time  range)  metroNIDAZOLE (FLAGYL) IVPB 500 mg (has no administration in time range)  aspirin EC tablet 81 mg (has no administration in time range)  acyclovir (ZOVIRAX) tablet 400 mg (has no administration in time range)  mirtazapine (REMERON) tablet 15 mg (has no administration in time range)  gabapentin (NEURONTIN) capsule 600 mg (has no administration in time range)  acetaminophen (TYLENOL) tablet 650 mg (has no administration in time range)    Or  acetaminophen (TYLENOL) suppository 650 mg (has no administration in time range)  traZODone (DESYREL) tablet 25 mg (has no administration in time range)  ondansetron (ZOFRAN) tablet 4 mg (has no administration in time range)    Or  ondansetron (ZOFRAN) injection 4 mg (has no administration in time range)  albuterol (PROVENTIL) (2.5 MG/3ML) 0.083% nebulizer solution 2.5 mg (has no administration in time range)  lactated ringers bolus 1,000 mL (1,000 mLs Intravenous New Bag/Given 11/16/22 0647)  acetaminophen (TYLENOL) tablet 650 mg (650 mg Oral Given 11/16/22 0650)  ceFEPIme (MAXIPIME) 2 g in sodium chloride 0.9 % 100 mL IVPB (2 g Intravenous  New Bag/Given 11/16/22 0649)  metroNIDAZOLE (FLAGYL) IVPB 500 mg (500 mg Intravenous New Bag/Given 11/16/22 0653)    Mobility walks

## 2022-11-17 ENCOUNTER — Ambulatory Visit: Payer: Medicare Other | Admitting: Cardiovascular Disease

## 2022-11-17 ENCOUNTER — Encounter (HOSPITAL_COMMUNITY): Payer: Self-pay | Admitting: Internal Medicine

## 2022-11-17 ENCOUNTER — Inpatient Hospital Stay (HOSPITAL_COMMUNITY): Payer: Medicare Other

## 2022-11-17 DIAGNOSIS — E876 Hypokalemia: Secondary | ICD-10-CM | POA: Diagnosis not present

## 2022-11-17 DIAGNOSIS — D696 Thrombocytopenia, unspecified: Secondary | ICD-10-CM

## 2022-11-17 DIAGNOSIS — R350 Frequency of micturition: Secondary | ICD-10-CM

## 2022-11-17 DIAGNOSIS — K1230 Oral mucositis (ulcerative), unspecified: Secondary | ICD-10-CM

## 2022-11-17 DIAGNOSIS — D709 Neutropenia, unspecified: Secondary | ICD-10-CM | POA: Diagnosis not present

## 2022-11-17 DIAGNOSIS — D61818 Other pancytopenia: Secondary | ICD-10-CM

## 2022-11-17 DIAGNOSIS — R17 Unspecified jaundice: Secondary | ICD-10-CM

## 2022-11-17 DIAGNOSIS — E871 Hypo-osmolality and hyponatremia: Secondary | ICD-10-CM

## 2022-11-17 LAB — BLOOD CULTURE ID PANEL (REFLEXED) - BCID2

## 2022-11-17 LAB — CBC
HCT: 22.8 % — ABNORMAL LOW (ref 39.0–52.0)
Hemoglobin: 7.4 g/dL — ABNORMAL LOW (ref 13.0–17.0)
MCH: 30 pg (ref 26.0–34.0)
MCHC: 32.5 g/dL (ref 30.0–36.0)
MCV: 92.3 fL (ref 80.0–100.0)
Platelets: 24 10*3/uL — CL (ref 150–400)
RBC: 2.47 MIL/uL — ABNORMAL LOW (ref 4.22–5.81)
RDW: 15.6 % — ABNORMAL HIGH (ref 11.5–15.5)
WBC: 0.2 10*3/uL — CL (ref 4.0–10.5)
nRBC: 0 % (ref 0.0–0.2)

## 2022-11-17 LAB — BPAM PLATELET PHERESIS
Blood Product Expiration Date: 202410142359
ISSUE DATE / TIME: 202410131540
Unit Type and Rh: 5100

## 2022-11-17 LAB — PREPARE PLATELET PHERESIS: Unit division: 0

## 2022-11-17 LAB — BASIC METABOLIC PANEL
Anion gap: 11 (ref 5–15)
BUN: 18 mg/dL (ref 8–23)
CO2: 20 mmol/L — ABNORMAL LOW (ref 22–32)
Calcium: 7.7 mg/dL — ABNORMAL LOW (ref 8.9–10.3)
Chloride: 102 mmol/L (ref 98–111)
Creatinine, Ser: 1.02 mg/dL (ref 0.61–1.24)
GFR, Estimated: >60 mL/min (ref >60)
Glucose, Bld: 103 mg/dL — ABNORMAL HIGH (ref 70–99)
Potassium: 3 mmol/L — ABNORMAL LOW (ref 3.5–5.1)
Sodium: 133 mmol/L — ABNORMAL LOW (ref 135–145)

## 2022-11-17 LAB — PREPARE RBC (CROSSMATCH)

## 2022-11-17 MED ORDER — POTASSIUM CHLORIDE CRYS ER 20 MEQ PO TBCR
40.0000 meq | EXTENDED_RELEASE_TABLET | Freq: Once | ORAL | Status: AC
  Administered 2022-11-17: 40 meq via ORAL
  Filled 2022-11-17: qty 2

## 2022-11-17 MED ORDER — SODIUM CHLORIDE 0.9% IV SOLUTION: Freq: Once | INTRAVENOUS | Status: DC

## 2022-11-17 MED ORDER — GUAIFENESIN-DM 100-10 MG/5ML PO SYRP
5.0000 mL | ORAL_SOLUTION | ORAL | Status: DC | PRN
  Administered 2022-11-17: 5 mL via ORAL
  Filled 2022-11-17: qty 10

## 2022-11-17 NOTE — Progress Notes (Signed)
RN Notified for vitals taken at this time

## 2022-11-17 NOTE — Plan of Care (Signed)
Problem: Education: Goal: Knowledge of General Education information will improve Description: Including pain rating scale, medication(s)/side effects and non-pharmacologic comfort measures Outcome: Progressing   Problem: Coping: Goal: Level of anxiety will decrease Outcome: Progressing   Problem: Safety: Goal: Ability to remain free from injury will improve Outcome: Progressing

## 2022-11-17 NOTE — Progress Notes (Signed)
Report called to Vernona Rieger, receiving RN at the [facility] at [facility]. Pt wife, Camelia Eng, also updated as to patient transferring. Second unit PRBC held due to pt transferring and Hgb 7.4 this am per MD Rai.

## 2022-11-17 NOTE — Discharge Summary (Signed)
TRANSFER SUMMARY   Alexander Saunders ZOX:[id number]  is a 72 y.o. male  Outpatient Primary MD for the patient is Jackie Plum, MD Admission date: 11/16/2022 Transfer Date 11/17/2022 Admitting Physician Mir Sharlette Dense, MD   Place of Transfer: [facility]  Accepting MD: Dr Alphonsa Gin  Mode: [facility]  Condition: guarded   Admission Diagnosis  Urinary frequency [R35.0] Hypokalemia [E87.6] Hyponatremia [E87.1] Serum total bilirubin elevated [R17] Neutropenic fever (HCC) [D70.9, R50.81] Pancytopenia (HCC) [D61.818]  Discharge Diagnosis     Severe sepsis, POA  Multiple myeloma  Pancytopenia  Profound neutropenia Neutropenic fever Anemia  Pseudomonas bacteremia  Right upper lobe pneumonia, likely aspiration pneumonia  Hyponatremia Hypokalemia  Severe protein calorie malnutrition        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 male with multiple myeloma and multiple rounds of treatment including  autologous stem cell transplant 2019 and had recent DCEP chemotherapy, completed 11/04/22  at [facility] presented with neutropenic fever.  Most recently he was receiving single agent monthly daratumumab but started experiencing failure to thrive, fatigue, etc. 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  He has been followed by Dr. Candise Che at [facility] as well as Dr. Anne Hahn at [facility].  He was admitted to [facility] for chemotherapy 9/30 to 10/6.  During his hospital stay, he had hypotension and his heart failure medications were discontinued.  He also had significant protein calorie malnutrition and weight loss, he wished to defer PEG placement per discharge summary.  He was started on Remeron 10/2.  Since discharge, he was also seen by outpatient cardiology who agreed to holding his antihypertensives.  He was treated for oral thrush, completed a course of fluconazole on 10/10.  He returned on 10/7 for PEG-filgrastim.  History was provided by the patient's wife and sister who were at bedside in the ER.  They stated that initially after discharge from the hospital he was actually doing quite well, quite active.  5 to 6 days later, he developed a dry cough, and urinary urgency and frequency.  No dysuria.  In the last 24 hours PTA, he started having shaking chills and fever and so he was brought to the ER for evaluation.   ED Course: Here in the emergency department he has had a fever as high as 102.2, heart rate 144 initially now improved to  124.  Blood pressure 119/59, saturating well on room air.  Lab work in the ER showed pancytopenia.  Sodium 131, potassium 3.3, normal renal function, unremarkable LFTs.  Lactate 1.0.  Patient was given IV fluid bolus of 1 L, started on maintenance IV fluids.  He was given broad-spectrum IV antibiotics as noted below after blood cultures were obtained. Patient's wife requested  admission at [facility].   Assessment/Plan    Severe sepsis, POA, neutropenic fever, Pseudomonas bacteremia, right upper lobe pneumonia -Patient met sepsis criteria with fever, tachycardia and hypotension with SBP dropped greater than 40 mmHg.  Source is neutropenic fever  -Chest x-ray showed right upper lobe pneumonia, blood cultures positive for gram-negative rods, BC ID positive for Pseudomonas. -Patient was placed on broad-spectrum IV antibiotics including vancomycin, cefepime, metronidazole, follow blood cultures.  Urine culture in process. -Patient did have an episode of nausea and vomiting, possibly aspiration pneumonia,   Neutropenic fever-in the setting of severe neutropenia due to recent chemotherapy.  -Blood cultures 2/2 positive for gram-negative rods, BC ID Pseudomonas -Oncology was consulted, patient was seen by Dr. Cherly Hensen, patient had received long-acting G-CSF, recommended to continue monitor daily. -Follow cultures, continue broad-spectrum antibiotics, neutropenic precautions   Pancytopenia due to recent DCEP chemotherapy -Hemoglobin 6.6 on admission, transfused, hemoglobin 7.4 this morning -Platelets 24K, currently no active bleeding -Per oncology, Dr Cherly Hensen, transfuse packed RBCs for hemoglobin less than 7 or symptomatic, transfuse platelets for platelets <20 or bleeding or infection.  Hypovolemic hyponatremia -Patient received IV fluid hydration, sodium 131 on admission, improving to 133 today  Hypokalemia -Replaced  History of multiple myeloma - relapsed myeloma currently undergoing treatment, completed DCEP on 10/1 at [facility].     Sacral wound-does not appear infected, will consult wound care   Subjective:   Patient seen and examined prior to the transfer.  No acute complaints although spiking fevers this morning, tmax 101.6 F at 4 AM, tachycardia,  on room air.  No active nausea vomiting, chest pain or shortness of breath. + Cough.  Had a watery  BM today.  Objective:   Blood pressure 119/77, pulse (!) 124, temperature 98 F (36.7 C), temperature source Oral, resp. rate 16, height 6' (1.829 m), weight 80.7 kg, SpO2 95%.  Intake/Output Summary (Last 24 hours) at 11/17/2022 0913 Last data filed at 11/17/2022 0500 Gross per 24 hour  Intake 3329.67 ml  Output 400 ml  Net 2929.67 ml   Physical Exam General: Alert and oriented x 3, NAD, sitting at the side of the bed. Cardiovascular: S1 S2 clear, RRR.  Tachycardia Respiratory: Diminished breath at bases, no wheezing Gastrointestinal: Soft, nontender, nondistended, NBS Ext: no pedal edema bilaterally Neuro: no new deficits Skin: Small pressure ulcer on his right patella, no induration tenderness or drainage. Psych: Normal affect   Data Review  CBC w Diff:  Lab Results  Component Value Date   WBC 0.2 (LL) 11/17/2022   HGB 7.4 (L) 11/17/2022   HGB 8.2 (L) 11/14/2022   HGB 8.0 (L) 10/22/2022   HGB 12.0 (L) 02/06/2017   HCT 22.8 (L) 11/17/2022   HCT 20.9 (L) 10/24/2022   HCT 36.2 (L) 02/06/2017   PLT 24 (LL) 11/17/2022   PLT 58 (L) 11/14/2022   PLT 297 10/22/2022   LYMPHOPCT 82 11/14/2022   LYMPHOPCT 15.4 02/06/2017   MONOPCT 9 11/14/2022   MONOPCT 10.3 02/06/2017   EOSPCT 0 11/14/2022   EOSPCT 2.5 02/06/2017   BASOPCT 0 11/14/2022   BASOPCT 0.3 02/06/2017  CMP:  Lab Results  Component Value Date   NA 133 (L) 11/17/2022   NA 140 10/22/2022   NA 137 02/06/2017   K 3.0 (L) 11/17/2022   K 4.0 02/06/2017   CL 102 11/17/2022   CO2 20 (L) 11/17/2022   CO2 24 02/06/2017   BUN 18 11/17/2022   BUN 23 10/22/2022   BUN 14.9 02/06/2017   CREATININE 1.02 11/17/2022   CREATININE 0.80 11/14/2022   CREATININE 0.9 02/06/2017   PROT 5.7 (L) 11/16/2022   PROT 6.1 (L) 02/06/2017   ALBUMIN 3.1 (L) 11/16/2022   ALBUMIN 3.7 02/06/2017   BILITOT 2.6 (H) 11/16/2022   BILITOT 1.7 (H) 11/14/2022   BILITOT 0.42 02/06/2017   ALKPHOS 87 11/16/2022   ALKPHOS 31 (L) 02/06/2017    AST 19 11/16/2022   AST 13 (L) 11/14/2022   AST 32 02/06/2017   ALT 35 11/16/2022   ALT 31 11/14/2022   ALT 43 02/06/2017  .  Significant Tests   Significant Diagnostic Studies:  DG CHEST PORT 1 VIEW  Result Date: 11/17/2022 CLINICAL DATA:  Shortness of breath, cough EXAM: PORTABLE CHEST 1 VIEW COMPARISON:  11/16/2022 FINDINGS: Heart and mediastinal contours within normal limits. Consolidation in the right upper lobe compatible with pneumonia. Left lung clear. No effusions or acute bony abnormality. IMPRESSION: Right upper lobe pneumonia. Electronically Signed   By: Charlett Nose M.D.   On: 11/17/2022 02:17   DG Chest 2 View  Result Date: 11/16/2022 CLINICAL DATA:  72 year old male with history of shortness of breath. EXAM: CHEST - 2 VIEW COMPARISON:  Chest x-ray 10/07/2022. FINDINGS: Lung volumes are normal. No consolidative airspace disease. No pleural effusions. No pneumothorax. No pulmonary nodule or mass noted. Pulmonary vasculature and the cardiomediastinal silhouette are within normal limits. IMPRESSION: No radiographic evidence of acute cardiopulmonary disease. Electronically Signed   By: Trudie Reed M.D.   On: 11/16/2022 07:09    2D ECHO:    Scheduled Meds:  sodium chloride   Intravenous Once   acyclovir  400 mg Oral Daily   aspirin EC  81 mg Oral Daily   gabapentin  600 mg Oral BID   magic mouthwash w/lidocaine  5 mL Oral QID   mirtazapine  15 mg Oral QHS   Continuous Infusions:  ceFEPime (MAXIPIME) IV 2 g (11/17/22 0865)   metronidazole 500 mg (11/16/22 2035)   vancomycin 1,000 mg (11/16/22 2242)       The risks and  Benefits of transporting including disability, death and ...Marland KitchenMarland KitchenMarland Kitchen were discussed with the patient or health power of attorney and were agreeable to the plan and further management.  Total Time in preparing paper work, todays exam and data evaluation: 35 minutes  Signed: Thad Ranger M.D. [facility] 11/17/2022, 9:13 AM

## 2022-11-17 NOTE — Progress Notes (Addendum)
Patient to be transported to [facility] 7E Room# 721 Report called to Cedra RN [phone] Day shift nurse to arrange transport. Wife preferred to have her husband  transferred during daylight hrs.

## 2022-11-17 NOTE — Progress Notes (Signed)
Patient has new onset of expiratory wheezing, crackles in upper and lower lung fields, and cough with frothy sputum. Prosper. Lequita Asal, MD and Leotis Shames, CN notified. MD present at bedside. See new orders. Will continue with plan of care .

## 2022-11-17 NOTE — Progress Notes (Signed)
PHARMACY - PHYSICIAN COMMUNICATION CRITICAL VALUE ALERT - BLOOD CULTURE IDENTIFICATION (BCID)  Alexander Saunders is an 72 y.o. male who presented to [facility] on 11/16/2022 with a chief complaint of fever, urinary frequency, cough.  Assessment:   Blood cx + GNR; BCID + Pseudomonas aeruginosa, no resistance detected. Neutropenic post chemotherapy; still febrile.  Suspected urinary source.   Name of physician (or Provider) Contacted: [place]  Current antibiotics: Cefepime + Vancomycin + Flagyl  Changes to prescribed antibiotics recommended:  Continue Cefepime Consider d/c Vancomycin & Flagyl  No results found for this or any previous visit.  Junita Push PharmD 11/17/2022  6:26 AM

## 2022-11-18 ENCOUNTER — Other Ambulatory Visit: Payer: Medicare Other

## 2022-11-18 LAB — URINE CULTURE

## 2022-11-18 LAB — TYPE AND SCREEN
ABO/RH(D): O POS
Antibody Screen: POSITIVE
Unit division: 0

## 2022-11-18 LAB — BPAM RBC
Blood Product Expiration Date: 202411072359
ISSUE DATE / TIME: 202410131824
Unit Type and Rh: 5100

## 2022-11-19 LAB — CULTURE, BLOOD (ROUTINE X 2): Special Requests: ADEQUATE

## 2022-11-21 ENCOUNTER — Inpatient Hospital Stay: Payer: Medicare Other

## 2022-11-21 ENCOUNTER — Ambulatory Visit: Payer: Medicare Other

## 2022-11-21 ENCOUNTER — Inpatient Hospital Stay: Payer: Medicare Other | Admitting: Hematology

## 2022-11-25 ENCOUNTER — Other Ambulatory Visit: Payer: Self-pay | Admitting: Hematology

## 2022-11-25 DIAGNOSIS — C9000 Multiple myeloma not having achieved remission: Secondary | ICD-10-CM

## 2022-12-12 NOTE — Progress Notes (Unsigned)
Cardiology Office Note:    Date:  12/12/2022   ID:  Alexander Saunders, DOB 05-26-1950, MRN [id number]  PCP:  Jackie Plum, MD  Cardiologist:  Nanetta Batty, MD { Click to update primary MD,subspecialty MD or APP then REFRESH:1}    Referring MD: Jackie Plum, MD   Chief Complaint: follow-up of CHF  History of Present Illness:    Alexander Saunders is a 72 y.o. male with a history of non-ischemic cardiomyopathy/ chronic HFrEF with EF of 15-20% on Echo in 11/2022, palpitations, recent diagnosis of  bilateral PE/ left lower extremity DVT in 11/2022, hypertension, anemia and multiple myeloma/ plasma cell leukemia s/p stem cell transplant in 2019 and chemo who is followed by Dr. Allyson Saunders and presents today for follow-up of CHF.   Patient is mainly followed by Cardiology for history of non-ischemic cardiomyopathy which was initially diagnosed in 04/2021. Echo at that time showed LVEF of 25-30% with global hypokinesis and grade 2 diastolic dysfunction, mildly reduced RV, severe dilated left atrium, moderate to severe MR (felt to be functional), mild dilatation of the ascending aorta measuring 39 mm, and a small pericardial effusion. An ischemic evaluation was never performed and the cardiomyopathy was felt to be secondary to the chemo that he was on for his multiple myeloma. He was started on GDMT and EF ultimately normalized to 50-55% on Echo in 06/2022. Last Echo in 09/2022 showed LVEF of 50-55% with normal wall motion and grade 1 diastolic dysfunction, normal RV function, mild MR, and mild dilatation of the aortic root measuring 40 mm.  He was last seen by [name], NP, in 10/2022 after a recent ED visit for pre-syncope. BP was soft and Entresto was stopped. At visit with [name], he reported some increased fatigue, poor appetite, shortness of breath, and occasional intermittent periods of chest discomfort. His hemoglobin during recent ED had dropped to 8.9 (down from 10 in 09/2022 and 12 range in  08/2022). Repeat CBC was checked and hemoglobin at dropped further to 8.0. Symptoms were felt to possibly be due to anemia and he was advised to follow up with HemOnc.   Following this visit, work-up with HemOnc showed progression of disease to plasma cell leukemia. He was admitted from 11/03/2022 to 11/09/2022 at [name] for chemotherapy (DCEP). Hospitalization was complicated by oral candida, worsening renal function, and low BP. His CHF were ultimately stopped due to his BP.   He was admitted at [name] on 11/16/2022 for severe sepsis and neutropenic fever secondary to Pseudomonas bacteremia and right upper lobe pneumonia. On admission, labs were remarkable for WBC 0.1, Hgb 7.1, Plts 20. He was started on IV antibiotics and given 1 unit of PRBCs and then transferred to [name] on 11/27/2022. [facility] were negative to date. WBC and platelet count were initially very low; however, hospitalization was then complicated by leukocytosis and thrombocytosis. WBC peaked at 27.6 and platelets peaked at 1,055. Hematology was consulted and suspect this was due to a filgrastim reaction. Hospitalization was also complicated by acute CHF with pulmonary edema and bilateral PE/ left lower extremity DVT. Echo showed LVEF of <15% with severe global hypokinesis, normal RV function, mild AI, moderate MR, and moderate TR. She was diuresed and repeat limited Echo later in admission showed LVEF of 15-20% with only mild MR. GDMT was limited by orthostatic hypotension but she was ultimately able to be started on Spironolactone. Cardiology did not feel like any ischemic evaluation was  necessary. Family requested a referral to Cardio-Onc clinic at [name] after discharge. She was discharged on 12/09/2022 on Lasix 20mg  PRN, Spironolactone 12.5mg  daily, and Eliquis 5mg  twice daily.   Patient presents today for follow-up. ***  Non-Ischemic Cardiomyopathy Chronic  HFrEF Initially diagnosed in 04/2021 when found to have an EF of  25-30%. Cardiomyopathy was felt to be from chemotherapy for his multiple myeloma. He was started on GDMT and EF normalized to 50-55%. GDMT recently stopped in due to problems with low BP. He admitted at [facility] in 11/2022 for severe sepsis and neutropenic fever secondary to Pseudomonas bacteremia and hospitalization was complicated by acute CHF. Echo showed a significant drop in EF to <15% with severe global hypokinesis, normal RV function, mild AI, moderate MR, and moderate TR.  Ischemic evaluation was not felt to be necessary and cardiomyopathy was felt to be secondary to recent chemotherapy. He was diuresed. Repeat Echo prior to discharge showed LVEF of 15-20% with only mild MR. GDMT was limited by orthostatic hypotension.  - Euvolemic on exam. *** - Continue Lasix 20mg  daily as needed for weight gain.  - Continue Spironolactone 12.5mg  daily.  - Discussed importance of daily weights and sodium/ fluid restrictions.  - She is scheduled to see in the Cardio-Onc clinic at [name] later this month.  Bilateral PE Left Lower Extremity DVT Recently diagnosed during admission at [name] in 11/2022.  - Continue Eliquis.  - Management per PCP/ HemOnc.   Hypertension Patient has a history of hypertension but recently as struggled more with soft BP and orthostatic hypotension which as limited GDMT.  - BP *** - Continue medications for CHF as above.    EKGs/Labs/Other Studies Reviewed:    The following studies were reviewed:  Echocardiogram 04/29/2021: Impressions: 1. Left ventricular ejection fraction, by estimation, is 25 to 30%. The  left ventricle has severely decreased function. The left ventricle  demonstrates global hypokinesis. The left ventricular internal cavity size  was moderately to severely dilated.  Left ventricular diastolic parameters are consistent with Grade II   diastolic dysfunction (pseudonormalization). The average left ventricular  global longitudinal strain is -8.6 %. The global longitudinal strain is  abnormal.   2. Right ventricular systolic function is mildly reduced. The right  ventricular size is normal.   3. Left atrial size was severely dilated.   4. A small pericardial effusion is present. The pericardial effusion is  circumferential but appears largest around the RA. There is mild RA  diastolic collapse with no evidence of tamponade. IVC is collapsible.   5. The mitral valve is normal in structure. There is moderate to severe  mitral valve regurgitation with EROA 0.3cm2, RVol 41mL.   6. The aortic valve is tricuspid. There is mild calcification of the  aortic valve. There is mild thickening of the aortic valve. Aortic valve  regurgitation is trivial. Aortic valve sclerosis/calcification is present,  without any evidence of aortic  stenosis.   7. Aortic dilatation noted. There is mild dilatation of the ascending  aorta, measuring 39 mm.   8. The inferior vena cava is normal in size with greater than 50%  respiratory variability, suggesting right atrial pressure of 3 mmHg.   9. Cannot exclude a small PFO.  _______________  Echocardiogram 09/16/2022: Impressions: 1. Left ventricular ejection fraction, by estimation, is 50 to 55%. Left  ventricular ejection fraction by 3D volume is 52 %. The left ventricle has  low normal  function. The left ventricle has no regional wall motion  abnormalities. Left ventricular  diastolic parameters are consistent with Grade I diastolic dysfunction  (impaired relaxation).   2. Right ventricular systolic function is normal. The right ventricular  size is normal.   3. The mitral valve is normal in structure. Mild mitral valve  regurgitation. No evidence of mitral stenosis.   4. The aortic valve is tricuspid. Aortic valve regurgitation is not  visualized. No aortic stenosis is present.   5. Aortic  dilatation noted. There is mild dilatation of the aortic root,  measuring 40 mm.   6. The inferior vena cava is normal in size with greater than 50%  respiratory variability, suggesting right atrial pressure of 3 mmHg.  _______________  Echocardiogram 11/18/2022 ([facility]): Summary: The left ventricle is severely dilated.  There is normal left ventricular wall thickness.  Left ventricular systolic function is severely reduced.  LV ejection fraction = <15%.  There is severe global hypokinesis of the left ventricle.  The right ventricle is normal in size and function.  There is mild aortic regurgitation.  There is moderate mitral regurgitation.  There is moderate tricuspid regurgitation.  The aortic sinus is normal size.  The IVC was moderately dilated.  There is no pericardial effusion.  _______________  Limited Echocardiogram 12/02/2022 ([facility]): Summary: The left ventricle is severely dilated. Left ventricular systolic function is severely reduced.  There is severe global hypokinesis of the left ventricle.LV ejection fraction = 15-20%.  The right ventricle is borderline dilated.  The right ventricular systolic function is normal.  There is mild mitral regurgitation.  There is no pericardial effusion.  The IVC is normal in size with an inspiratory collapse of greater than 50%, suggesting normal right  atrial pressure.   EKG:  EKG not ordered today.   Recent Labs: 10/07/2022: B Natriuretic Peptide 297.2 10/24/2022: TSH 1.304 11/14/2022: Magnesium 1.5 11/16/2022: ALT 35 11/17/2022: BUN 18; Creatinine, Ser 1.02; Hemoglobin 7.4; Platelets 24; Potassium 3.0; Sodium 133  Recent Lipid Panel No results found for: "CHOL", "TRIG", "HDL", "CHOLHDL", "VLDL", "LDLCALC", "LDLDIRECT"  Physical Exam:    Vital Signs: There were no vitals taken for this visit.    Wt Readings from Last 3 Encounters:  11/16/22 177 lb 14.6 oz (80.7 kg)  10/30/22 184 lb  (83.5 kg)  10/28/22 184 lb (83.5 kg)     General: 72 y.o. male in no acute distress. HEENT: Normocephalic and atraumatic. Sclera clear.  Neck: Supple. No carotid bruits. No JVD. Heart: *** RRR. Distinct S1 and S2. No murmurs, gallops, or rubs.  Lungs: No increased work of breathing. Clear to ausculation bilaterally. No wheezes, rhonchi, or rales.  Abdomen: Soft, non-distended, and non-tender to palpation.  Extremities: No lower extremity edema.  Radial and distal pedal pulses 2+ and equal bilaterally. Skin: Warm and dry. Neuro: No focal deficits. Psych: Normal affect. Responds appropriately.   Assessment:    No diagnosis found.  Plan:     Disposition: Follow up in ***   Signed, Corrin Parker, PA-C  12/12/2022 8:04 PM    [facility]

## 2022-12-15 DIAGNOSIS — C9000 Multiple myeloma not having achieved remission: Secondary | ICD-10-CM | POA: Diagnosis not present

## 2022-12-17 ENCOUNTER — Encounter: Payer: Self-pay | Admitting: Hematology

## 2022-12-18 ENCOUNTER — Ambulatory Visit: Payer: Medicare Other | Attending: Student | Admitting: Student

## 2022-12-18 ENCOUNTER — Encounter: Payer: Self-pay | Admitting: Student

## 2022-12-18 VITALS — BP 96/62 | HR 110 | Ht 72.0 in | Wt 165.0 lb

## 2022-12-18 DIAGNOSIS — I5020 Unspecified systolic (congestive) heart failure: Secondary | ICD-10-CM | POA: Insufficient documentation

## 2022-12-18 DIAGNOSIS — I428 Other cardiomyopathies: Secondary | ICD-10-CM | POA: Insufficient documentation

## 2022-12-18 DIAGNOSIS — D649 Anemia, unspecified: Secondary | ICD-10-CM | POA: Insufficient documentation

## 2022-12-18 DIAGNOSIS — I2699 Other pulmonary embolism without acute cor pulmonale: Secondary | ICD-10-CM | POA: Diagnosis not present

## 2022-12-18 DIAGNOSIS — I824Y9 Acute embolism and thrombosis of unspecified deep veins of unspecified proximal lower extremity: Secondary | ICD-10-CM | POA: Insufficient documentation

## 2022-12-18 DIAGNOSIS — I10 Essential (primary) hypertension: Secondary | ICD-10-CM | POA: Insufficient documentation

## 2022-12-18 MED ORDER — FUROSEMIDE 20 MG PO TABS: ORAL_TABLET | ORAL | 3 refills | Status: AC

## 2022-12-18 NOTE — Patient Instructions (Signed)
Medication Instructions:  Lasix 20 mg daily as needed only for swelling or weight gain. Please take blood pressure prior to taking Lasix. If your systolic BP (top number) is less than 100, please don't take lasix.   *If you need a refill on your cardiac medications before your next appointment, please call your pharmacy*   Lab Work: BMET & CBC today.  Testing/Procedures: NONE ordered at this time of appointment     Follow-Up: At [facility], you and your health needs are our priority.  As part of our continuing mission to provide you with exceptional heart care, we have created designated Provider Care Teams.  These Care Teams include your primary Cardiologist (physician) and Advanced Practice Providers (APPs -  Physician Assistants and Nurse Practitioners) who all work together to provide you with the care you need, when you need it.  We recommend signing up for the patient portal called "MyChart".  Sign up information is provided on this After Visit Summary.  MyChart is used to connect with patients for Virtual Visits (Telemedicine).  Patients are able to view lab/test results, encounter notes, upcoming appointments, etc.  Non-urgent messages can be sent to your provider as well.   To learn more about what you can do with MyChart, go to [email or web address].    Your next appointment:   2 month(s)  Provider:   Marjie Skiff, PA-C

## 2022-12-19 ENCOUNTER — Inpatient Hospital Stay: Payer: Medicare Other

## 2022-12-19 ENCOUNTER — Encounter: Payer: Self-pay | Admitting: Hematology

## 2022-12-19 ENCOUNTER — Ambulatory Visit: Payer: Medicare Other

## 2022-12-19 ENCOUNTER — Other Ambulatory Visit: Payer: Self-pay

## 2022-12-19 ENCOUNTER — Inpatient Hospital Stay: Payer: Medicare Other | Attending: Oncology | Admitting: Hematology

## 2022-12-19 LAB — BASIC METABOLIC PANEL
BUN/Creatinine Ratio: 23 (ref 10–24)
BUN: 16 mg/dL (ref 8–27)
CO2: 24 mmol/L (ref 20–29)
Calcium: 9.5 mg/dL (ref 8.6–10.2)
Chloride: 104 mmol/L (ref 96–106)
Creatinine, Ser: 0.7 mg/dL — ABNORMAL LOW (ref 0.76–1.27)
Glucose: 88 mg/dL (ref 70–99)
Potassium: 4.2 mmol/L (ref 3.5–5.2)
Sodium: 141 mmol/L (ref 134–144)
eGFR: 97 mL/min/{1.73_m2} (ref >59)

## 2022-12-19 LAB — CBC
Hematocrit: 27.9 % — ABNORMAL LOW (ref 37.5–51.0)
Hemoglobin: 9.2 g/dL — ABNORMAL LOW (ref 13.0–17.7)
MCH: 30.7 pg (ref 26.6–33.0)
MCHC: 33 g/dL (ref 31.5–35.7)
MCV: 93 fL (ref 79–97)
Platelets: 291 10*3/uL (ref 150–450)
RBC: 3 x10E6/uL — ABNORMAL LOW (ref 4.14–5.80)
RDW: 19.4 % — ABNORMAL HIGH (ref 11.6–15.4)
WBC: 9.6 10*3/uL (ref 3.4–10.8)

## 2022-12-23 DIAGNOSIS — N401 Enlarged prostate with lower urinary tract symptoms: Secondary | ICD-10-CM | POA: Diagnosis not present

## 2022-12-23 DIAGNOSIS — R350 Frequency of micturition: Secondary | ICD-10-CM | POA: Diagnosis not present

## 2022-12-24 DIAGNOSIS — R350 Frequency of micturition: Secondary | ICD-10-CM | POA: Diagnosis not present

## 2022-12-24 DIAGNOSIS — N401 Enlarged prostate with lower urinary tract symptoms: Secondary | ICD-10-CM | POA: Diagnosis not present

## 2022-12-25 DIAGNOSIS — I5020 Unspecified systolic (congestive) heart failure: Secondary | ICD-10-CM

## 2022-12-25 DIAGNOSIS — I10 Essential (primary) hypertension: Secondary | ICD-10-CM

## 2022-12-25 NOTE — Telephone Encounter (Signed)
Referral entered  

## 2022-12-26 DIAGNOSIS — I5020 Unspecified systolic (congestive) heart failure: Secondary | ICD-10-CM | POA: Diagnosis not present

## 2023-01-05 DIAGNOSIS — I5022 Chronic systolic (congestive) heart failure: Secondary | ICD-10-CM | POA: Diagnosis not present

## 2023-01-05 DIAGNOSIS — C9000 Multiple myeloma not having achieved remission: Secondary | ICD-10-CM | POA: Diagnosis not present

## 2023-01-23 DIAGNOSIS — I5022 Chronic systolic (congestive) heart failure: Secondary | ICD-10-CM | POA: Diagnosis not present

## 2023-01-23 DIAGNOSIS — Z86711 Personal history of pulmonary embolism: Secondary | ICD-10-CM | POA: Diagnosis not present

## 2023-01-23 DIAGNOSIS — I5020 Unspecified systolic (congestive) heart failure: Secondary | ICD-10-CM | POA: Diagnosis not present

## 2023-01-23 DIAGNOSIS — C9000 Multiple myeloma not having achieved remission: Secondary | ICD-10-CM | POA: Diagnosis not present

## 2023-01-29 DIAGNOSIS — C9000 Multiple myeloma not having achieved remission: Secondary | ICD-10-CM | POA: Diagnosis not present

## 2023-01-29 DIAGNOSIS — Z79899 Other long term (current) drug therapy: Secondary | ICD-10-CM | POA: Diagnosis not present

## 2023-02-02 ENCOUNTER — Encounter: Payer: Self-pay | Admitting: Cardiovascular Disease

## 2023-02-13 DIAGNOSIS — N401 Enlarged prostate with lower urinary tract symptoms: Secondary | ICD-10-CM | POA: Diagnosis not present

## 2023-02-13 DIAGNOSIS — R350 Frequency of micturition: Secondary | ICD-10-CM | POA: Diagnosis not present

## 2023-02-20 ENCOUNTER — Ambulatory Visit: Payer: Medicare Other | Admitting: Student

## 2023-03-02 DIAGNOSIS — C9002 Multiple myeloma in relapse: Secondary | ICD-10-CM | POA: Diagnosis present

## 2023-03-02 DIAGNOSIS — D630 Anemia in neoplastic disease: Secondary | ICD-10-CM | POA: Diagnosis present

## 2023-03-02 DIAGNOSIS — Z8619 Personal history of other infectious and parasitic diseases: Secondary | ICD-10-CM | POA: Diagnosis not present

## 2023-03-02 DIAGNOSIS — Z9484 Stem cells transplant status: Secondary | ICD-10-CM | POA: Diagnosis not present

## 2023-03-02 DIAGNOSIS — J849 Interstitial pulmonary disease, unspecified: Secondary | ICD-10-CM | POA: Diagnosis not present

## 2023-03-02 DIAGNOSIS — T380X5A Adverse effect of glucocorticoids and synthetic analogues, initial encounter: Secondary | ICD-10-CM | POA: Diagnosis not present

## 2023-03-02 DIAGNOSIS — T451X5D Adverse effect of antineoplastic and immunosuppressive drugs, subsequent encounter: Secondary | ICD-10-CM | POA: Diagnosis not present

## 2023-03-02 DIAGNOSIS — R262 Difficulty in walking, not elsewhere classified: Secondary | ICD-10-CM | POA: Diagnosis present

## 2023-03-02 DIAGNOSIS — D6481 Anemia due to antineoplastic chemotherapy: Secondary | ICD-10-CM | POA: Diagnosis present

## 2023-03-02 DIAGNOSIS — I498 Other specified cardiac arrhythmias: Secondary | ICD-10-CM | POA: Diagnosis not present

## 2023-03-02 DIAGNOSIS — Z79899 Other long term (current) drug therapy: Secondary | ICD-10-CM | POA: Diagnosis not present

## 2023-03-02 DIAGNOSIS — I5022 Chronic systolic (congestive) heart failure: Secondary | ICD-10-CM | POA: Diagnosis present

## 2023-03-02 DIAGNOSIS — I959 Hypotension, unspecified: Secondary | ICD-10-CM | POA: Diagnosis present

## 2023-03-02 DIAGNOSIS — I5020 Unspecified systolic (congestive) heart failure: Secondary | ICD-10-CM | POA: Diagnosis not present

## 2023-03-02 DIAGNOSIS — I428 Other cardiomyopathies: Secondary | ICD-10-CM | POA: Diagnosis present

## 2023-03-02 DIAGNOSIS — Z86711 Personal history of pulmonary embolism: Secondary | ICD-10-CM | POA: Diagnosis not present

## 2023-03-02 DIAGNOSIS — Z7901 Long term (current) use of anticoagulants: Secondary | ICD-10-CM | POA: Diagnosis not present

## 2023-03-02 DIAGNOSIS — T451X5A Adverse effect of antineoplastic and immunosuppressive drugs, initial encounter: Secondary | ICD-10-CM | POA: Diagnosis present

## 2023-03-02 DIAGNOSIS — C9000 Multiple myeloma not having achieved remission: Secondary | ICD-10-CM | POA: Diagnosis not present

## 2023-03-02 DIAGNOSIS — I340 Nonrheumatic mitral (valve) insufficiency: Secondary | ICD-10-CM | POA: Diagnosis not present

## 2023-03-02 DIAGNOSIS — I10 Essential (primary) hypertension: Secondary | ICD-10-CM | POA: Diagnosis present

## 2023-03-02 DIAGNOSIS — R079 Chest pain, unspecified: Secondary | ICD-10-CM | POA: Diagnosis not present

## 2023-03-02 DIAGNOSIS — R000 Tachycardia, unspecified: Secondary | ICD-10-CM | POA: Diagnosis not present

## 2023-03-02 DIAGNOSIS — N179 Acute kidney failure, unspecified: Secondary | ICD-10-CM | POA: Diagnosis present

## 2023-03-02 DIAGNOSIS — K219 Gastro-esophageal reflux disease without esophagitis: Secondary | ICD-10-CM | POA: Diagnosis present

## 2023-03-02 DIAGNOSIS — G620 Drug-induced polyneuropathy: Secondary | ICD-10-CM | POA: Diagnosis present

## 2023-03-04 DIAGNOSIS — I498 Other specified cardiac arrhythmias: Secondary | ICD-10-CM | POA: Diagnosis not present

## 2023-03-05 DIAGNOSIS — K219 Gastro-esophageal reflux disease without esophagitis: Secondary | ICD-10-CM | POA: Diagnosis present

## 2023-03-05 DIAGNOSIS — T451X5A Adverse effect of antineoplastic and immunosuppressive drugs, initial encounter: Secondary | ICD-10-CM | POA: Diagnosis present

## 2023-03-05 DIAGNOSIS — T451X5D Adverse effect of antineoplastic and immunosuppressive drugs, subsequent encounter: Secondary | ICD-10-CM | POA: Diagnosis not present

## 2023-03-05 DIAGNOSIS — C9002 Multiple myeloma in relapse: Secondary | ICD-10-CM | POA: Diagnosis present

## 2023-03-05 DIAGNOSIS — R000 Tachycardia, unspecified: Secondary | ICD-10-CM | POA: Diagnosis not present

## 2023-03-05 DIAGNOSIS — D630 Anemia in neoplastic disease: Secondary | ICD-10-CM | POA: Diagnosis present

## 2023-03-05 DIAGNOSIS — Z8619 Personal history of other infectious and parasitic diseases: Secondary | ICD-10-CM | POA: Diagnosis not present

## 2023-03-05 DIAGNOSIS — Z86711 Personal history of pulmonary embolism: Secondary | ICD-10-CM | POA: Diagnosis not present

## 2023-03-05 DIAGNOSIS — C9000 Multiple myeloma not having achieved remission: Secondary | ICD-10-CM | POA: Diagnosis not present

## 2023-03-05 DIAGNOSIS — R262 Difficulty in walking, not elsewhere classified: Secondary | ICD-10-CM | POA: Diagnosis present

## 2023-03-05 DIAGNOSIS — R079 Chest pain, unspecified: Secondary | ICD-10-CM | POA: Diagnosis not present

## 2023-03-05 DIAGNOSIS — I5020 Unspecified systolic (congestive) heart failure: Secondary | ICD-10-CM | POA: Diagnosis not present

## 2023-03-05 DIAGNOSIS — Z7901 Long term (current) use of anticoagulants: Secondary | ICD-10-CM | POA: Diagnosis not present

## 2023-03-05 DIAGNOSIS — I10 Essential (primary) hypertension: Secondary | ICD-10-CM | POA: Diagnosis present

## 2023-03-05 DIAGNOSIS — Z79899 Other long term (current) drug therapy: Secondary | ICD-10-CM | POA: Diagnosis not present

## 2023-03-05 DIAGNOSIS — Z9484 Stem cells transplant status: Secondary | ICD-10-CM | POA: Diagnosis not present

## 2023-03-05 DIAGNOSIS — T380X5A Adverse effect of glucocorticoids and synthetic analogues, initial encounter: Secondary | ICD-10-CM | POA: Diagnosis not present

## 2023-03-05 DIAGNOSIS — D6481 Anemia due to antineoplastic chemotherapy: Secondary | ICD-10-CM | POA: Diagnosis present

## 2023-03-05 DIAGNOSIS — I428 Other cardiomyopathies: Secondary | ICD-10-CM | POA: Diagnosis present

## 2023-03-05 DIAGNOSIS — N179 Acute kidney failure, unspecified: Secondary | ICD-10-CM | POA: Diagnosis present

## 2023-03-05 DIAGNOSIS — I5022 Chronic systolic (congestive) heart failure: Secondary | ICD-10-CM | POA: Diagnosis present

## 2023-03-05 DIAGNOSIS — J849 Interstitial pulmonary disease, unspecified: Secondary | ICD-10-CM | POA: Diagnosis not present

## 2023-03-05 DIAGNOSIS — I959 Hypotension, unspecified: Secondary | ICD-10-CM | POA: Diagnosis present

## 2023-03-05 DIAGNOSIS — G620 Drug-induced polyneuropathy: Secondary | ICD-10-CM | POA: Diagnosis present

## 2023-03-13 DIAGNOSIS — Z9181 History of falling: Secondary | ICD-10-CM | POA: Diagnosis not present

## 2023-03-13 DIAGNOSIS — Z604 Social exclusion and rejection: Secondary | ICD-10-CM | POA: Diagnosis not present

## 2023-03-13 DIAGNOSIS — C9002 Multiple myeloma in relapse: Secondary | ICD-10-CM | POA: Diagnosis not present

## 2023-03-13 DIAGNOSIS — R7881 Bacteremia: Secondary | ICD-10-CM | POA: Diagnosis not present

## 2023-03-13 DIAGNOSIS — Z7901 Long term (current) use of anticoagulants: Secondary | ICD-10-CM | POA: Diagnosis not present

## 2023-03-13 DIAGNOSIS — N179 Acute kidney failure, unspecified: Secondary | ICD-10-CM | POA: Diagnosis not present

## 2023-03-13 DIAGNOSIS — G620 Drug-induced polyneuropathy: Secondary | ICD-10-CM | POA: Diagnosis not present

## 2023-03-13 DIAGNOSIS — H9190 Unspecified hearing loss, unspecified ear: Secondary | ICD-10-CM | POA: Diagnosis not present

## 2023-03-13 DIAGNOSIS — K219 Gastro-esophageal reflux disease without esophagitis: Secondary | ICD-10-CM | POA: Diagnosis not present

## 2023-03-13 DIAGNOSIS — D630 Anemia in neoplastic disease: Secondary | ICD-10-CM | POA: Diagnosis not present

## 2023-03-13 DIAGNOSIS — G8310 Monoplegia of lower limb affecting unspecified side: Secondary | ICD-10-CM | POA: Diagnosis not present

## 2023-03-13 DIAGNOSIS — Z9484 Stem cells transplant status: Secondary | ICD-10-CM | POA: Diagnosis not present

## 2023-03-13 DIAGNOSIS — Z556 Problems related to health literacy: Secondary | ICD-10-CM | POA: Diagnosis not present

## 2023-03-13 DIAGNOSIS — Z86711 Personal history of pulmonary embolism: Secondary | ICD-10-CM | POA: Diagnosis not present

## 2023-03-13 DIAGNOSIS — I5022 Chronic systolic (congestive) heart failure: Secondary | ICD-10-CM | POA: Diagnosis not present

## 2023-03-13 DIAGNOSIS — I959 Hypotension, unspecified: Secondary | ICD-10-CM | POA: Diagnosis not present

## 2023-03-16 DIAGNOSIS — C9000 Multiple myeloma not having achieved remission: Secondary | ICD-10-CM | POA: Diagnosis not present

## 2023-03-16 DIAGNOSIS — Z79899 Other long term (current) drug therapy: Secondary | ICD-10-CM | POA: Diagnosis not present

## 2023-03-17 DIAGNOSIS — D630 Anemia in neoplastic disease: Secondary | ICD-10-CM | POA: Diagnosis not present

## 2023-03-17 DIAGNOSIS — G620 Drug-induced polyneuropathy: Secondary | ICD-10-CM | POA: Diagnosis not present

## 2023-03-17 DIAGNOSIS — I5022 Chronic systolic (congestive) heart failure: Secondary | ICD-10-CM | POA: Diagnosis not present

## 2023-03-17 DIAGNOSIS — G8310 Monoplegia of lower limb affecting unspecified side: Secondary | ICD-10-CM | POA: Diagnosis not present

## 2023-03-17 DIAGNOSIS — N179 Acute kidney failure, unspecified: Secondary | ICD-10-CM | POA: Diagnosis not present

## 2023-03-17 DIAGNOSIS — C9002 Multiple myeloma in relapse: Secondary | ICD-10-CM | POA: Diagnosis not present

## 2023-03-18 DIAGNOSIS — C9002 Multiple myeloma in relapse: Secondary | ICD-10-CM | POA: Diagnosis not present

## 2023-03-18 DIAGNOSIS — D630 Anemia in neoplastic disease: Secondary | ICD-10-CM | POA: Diagnosis not present

## 2023-03-18 DIAGNOSIS — G8310 Monoplegia of lower limb affecting unspecified side: Secondary | ICD-10-CM | POA: Diagnosis not present

## 2023-03-18 DIAGNOSIS — I5022 Chronic systolic (congestive) heart failure: Secondary | ICD-10-CM | POA: Diagnosis not present

## 2023-03-18 DIAGNOSIS — G620 Drug-induced polyneuropathy: Secondary | ICD-10-CM | POA: Diagnosis not present

## 2023-03-18 DIAGNOSIS — N179 Acute kidney failure, unspecified: Secondary | ICD-10-CM | POA: Diagnosis not present

## 2023-03-20 DIAGNOSIS — I5022 Chronic systolic (congestive) heart failure: Secondary | ICD-10-CM | POA: Diagnosis not present

## 2023-03-20 DIAGNOSIS — G8310 Monoplegia of lower limb affecting unspecified side: Secondary | ICD-10-CM | POA: Diagnosis not present

## 2023-03-20 DIAGNOSIS — D630 Anemia in neoplastic disease: Secondary | ICD-10-CM | POA: Diagnosis not present

## 2023-03-20 DIAGNOSIS — N179 Acute kidney failure, unspecified: Secondary | ICD-10-CM | POA: Diagnosis not present

## 2023-03-20 DIAGNOSIS — G620 Drug-induced polyneuropathy: Secondary | ICD-10-CM | POA: Diagnosis not present

## 2023-03-20 DIAGNOSIS — C9002 Multiple myeloma in relapse: Secondary | ICD-10-CM | POA: Diagnosis not present

## 2023-03-24 DIAGNOSIS — G8310 Monoplegia of lower limb affecting unspecified side: Secondary | ICD-10-CM | POA: Diagnosis not present

## 2023-03-24 DIAGNOSIS — C9002 Multiple myeloma in relapse: Secondary | ICD-10-CM | POA: Diagnosis not present

## 2023-03-24 DIAGNOSIS — D630 Anemia in neoplastic disease: Secondary | ICD-10-CM | POA: Diagnosis not present

## 2023-03-24 DIAGNOSIS — N179 Acute kidney failure, unspecified: Secondary | ICD-10-CM | POA: Diagnosis not present

## 2023-03-24 DIAGNOSIS — G620 Drug-induced polyneuropathy: Secondary | ICD-10-CM | POA: Diagnosis not present

## 2023-03-24 DIAGNOSIS — I5022 Chronic systolic (congestive) heart failure: Secondary | ICD-10-CM | POA: Diagnosis not present

## 2023-03-24 DIAGNOSIS — C9000 Multiple myeloma not having achieved remission: Secondary | ICD-10-CM | POA: Diagnosis not present

## 2023-03-25 DIAGNOSIS — I5022 Chronic systolic (congestive) heart failure: Secondary | ICD-10-CM | POA: Diagnosis not present

## 2023-03-25 DIAGNOSIS — C9002 Multiple myeloma in relapse: Secondary | ICD-10-CM | POA: Diagnosis not present

## 2023-03-25 DIAGNOSIS — N179 Acute kidney failure, unspecified: Secondary | ICD-10-CM | POA: Diagnosis not present

## 2023-03-25 DIAGNOSIS — D630 Anemia in neoplastic disease: Secondary | ICD-10-CM | POA: Diagnosis not present

## 2023-03-25 DIAGNOSIS — G8310 Monoplegia of lower limb affecting unspecified side: Secondary | ICD-10-CM | POA: Diagnosis not present

## 2023-03-25 DIAGNOSIS — G620 Drug-induced polyneuropathy: Secondary | ICD-10-CM | POA: Diagnosis not present

## 2023-03-31 DIAGNOSIS — C9000 Multiple myeloma not having achieved remission: Secondary | ICD-10-CM | POA: Diagnosis not present

## 2023-04-01 DIAGNOSIS — C9000 Multiple myeloma not having achieved remission: Secondary | ICD-10-CM | POA: Diagnosis not present

## 2023-04-02 DIAGNOSIS — C9000 Multiple myeloma not having achieved remission: Secondary | ICD-10-CM | POA: Diagnosis not present

## 2023-04-03 DIAGNOSIS — C9000 Multiple myeloma not having achieved remission: Secondary | ICD-10-CM | POA: Diagnosis not present

## 2023-04-04 DIAGNOSIS — C9000 Multiple myeloma not having achieved remission: Secondary | ICD-10-CM | POA: Diagnosis not present

## 2023-04-05 DIAGNOSIS — C9000 Multiple myeloma not having achieved remission: Secondary | ICD-10-CM | POA: Diagnosis not present

## 2023-04-08 ENCOUNTER — Other Ambulatory Visit: Payer: Self-pay

## 2023-04-09 DIAGNOSIS — C9000 Multiple myeloma not having achieved remission: Secondary | ICD-10-CM | POA: Diagnosis not present

## 2023-04-09 DIAGNOSIS — Z5111 Encounter for antineoplastic chemotherapy: Secondary | ICD-10-CM | POA: Diagnosis not present

## 2023-04-09 DIAGNOSIS — Z5112 Encounter for antineoplastic immunotherapy: Secondary | ICD-10-CM | POA: Diagnosis not present

## 2023-04-09 DIAGNOSIS — Z79899 Other long term (current) drug therapy: Secondary | ICD-10-CM | POA: Diagnosis not present

## 2023-04-12 DIAGNOSIS — I5022 Chronic systolic (congestive) heart failure: Secondary | ICD-10-CM | POA: Diagnosis not present

## 2023-04-12 DIAGNOSIS — Z9484 Stem cells transplant status: Secondary | ICD-10-CM | POA: Diagnosis not present

## 2023-04-12 DIAGNOSIS — R7881 Bacteremia: Secondary | ICD-10-CM | POA: Diagnosis not present

## 2023-04-12 DIAGNOSIS — Z7901 Long term (current) use of anticoagulants: Secondary | ICD-10-CM | POA: Diagnosis not present

## 2023-04-12 DIAGNOSIS — Z556 Problems related to health literacy: Secondary | ICD-10-CM | POA: Diagnosis not present

## 2023-04-12 DIAGNOSIS — G620 Drug-induced polyneuropathy: Secondary | ICD-10-CM | POA: Diagnosis not present

## 2023-04-12 DIAGNOSIS — K219 Gastro-esophageal reflux disease without esophagitis: Secondary | ICD-10-CM | POA: Diagnosis not present

## 2023-04-12 DIAGNOSIS — Z604 Social exclusion and rejection: Secondary | ICD-10-CM | POA: Diagnosis not present

## 2023-04-12 DIAGNOSIS — D630 Anemia in neoplastic disease: Secondary | ICD-10-CM | POA: Diagnosis not present

## 2023-04-12 DIAGNOSIS — Z86711 Personal history of pulmonary embolism: Secondary | ICD-10-CM | POA: Diagnosis not present

## 2023-04-12 DIAGNOSIS — C9002 Multiple myeloma in relapse: Secondary | ICD-10-CM | POA: Diagnosis not present

## 2023-04-12 DIAGNOSIS — G8310 Monoplegia of lower limb affecting unspecified side: Secondary | ICD-10-CM | POA: Diagnosis not present

## 2023-04-12 DIAGNOSIS — N179 Acute kidney failure, unspecified: Secondary | ICD-10-CM | POA: Diagnosis not present

## 2023-04-12 DIAGNOSIS — I959 Hypotension, unspecified: Secondary | ICD-10-CM | POA: Diagnosis not present

## 2023-04-12 DIAGNOSIS — Z9181 History of falling: Secondary | ICD-10-CM | POA: Diagnosis not present

## 2023-04-12 DIAGNOSIS — H9190 Unspecified hearing loss, unspecified ear: Secondary | ICD-10-CM | POA: Diagnosis not present

## 2023-04-16 DIAGNOSIS — Z79899 Other long term (current) drug therapy: Secondary | ICD-10-CM | POA: Diagnosis not present

## 2023-04-16 DIAGNOSIS — Z5111 Encounter for antineoplastic chemotherapy: Secondary | ICD-10-CM | POA: Diagnosis not present

## 2023-04-16 DIAGNOSIS — C9000 Multiple myeloma not having achieved remission: Secondary | ICD-10-CM | POA: Diagnosis not present

## 2023-04-16 DIAGNOSIS — Z5112 Encounter for antineoplastic immunotherapy: Secondary | ICD-10-CM | POA: Diagnosis not present

## 2023-04-20 DIAGNOSIS — G620 Drug-induced polyneuropathy: Secondary | ICD-10-CM | POA: Diagnosis not present

## 2023-04-20 DIAGNOSIS — D630 Anemia in neoplastic disease: Secondary | ICD-10-CM | POA: Diagnosis not present

## 2023-04-20 DIAGNOSIS — G8310 Monoplegia of lower limb affecting unspecified side: Secondary | ICD-10-CM | POA: Diagnosis not present

## 2023-04-20 DIAGNOSIS — I5022 Chronic systolic (congestive) heart failure: Secondary | ICD-10-CM | POA: Diagnosis not present

## 2023-04-20 DIAGNOSIS — N179 Acute kidney failure, unspecified: Secondary | ICD-10-CM | POA: Diagnosis not present

## 2023-04-20 DIAGNOSIS — C9002 Multiple myeloma in relapse: Secondary | ICD-10-CM | POA: Diagnosis not present

## 2023-04-22 ENCOUNTER — Other Ambulatory Visit: Payer: Self-pay

## 2023-04-22 DIAGNOSIS — G8310 Monoplegia of lower limb affecting unspecified side: Secondary | ICD-10-CM | POA: Diagnosis not present

## 2023-04-22 DIAGNOSIS — C9002 Multiple myeloma in relapse: Secondary | ICD-10-CM | POA: Diagnosis not present

## 2023-04-22 DIAGNOSIS — D630 Anemia in neoplastic disease: Secondary | ICD-10-CM | POA: Diagnosis not present

## 2023-04-22 DIAGNOSIS — N179 Acute kidney failure, unspecified: Secondary | ICD-10-CM | POA: Diagnosis not present

## 2023-04-22 DIAGNOSIS — I5022 Chronic systolic (congestive) heart failure: Secondary | ICD-10-CM | POA: Diagnosis not present

## 2023-04-22 DIAGNOSIS — G620 Drug-induced polyneuropathy: Secondary | ICD-10-CM | POA: Diagnosis not present

## 2023-04-24 ENCOUNTER — Other Ambulatory Visit: Payer: Self-pay | Admitting: Hematology

## 2023-04-24 DIAGNOSIS — Z5112 Encounter for antineoplastic immunotherapy: Secondary | ICD-10-CM | POA: Diagnosis not present

## 2023-04-24 DIAGNOSIS — C9000 Multiple myeloma not having achieved remission: Secondary | ICD-10-CM

## 2023-04-24 DIAGNOSIS — Z5111 Encounter for antineoplastic chemotherapy: Secondary | ICD-10-CM | POA: Diagnosis not present

## 2023-04-24 DIAGNOSIS — Z79899 Other long term (current) drug therapy: Secondary | ICD-10-CM | POA: Diagnosis not present

## 2023-04-28 DIAGNOSIS — D630 Anemia in neoplastic disease: Secondary | ICD-10-CM | POA: Diagnosis not present

## 2023-04-28 DIAGNOSIS — I5022 Chronic systolic (congestive) heart failure: Secondary | ICD-10-CM | POA: Diagnosis not present

## 2023-04-28 DIAGNOSIS — C9002 Multiple myeloma in relapse: Secondary | ICD-10-CM | POA: Diagnosis not present

## 2023-04-28 DIAGNOSIS — G620 Drug-induced polyneuropathy: Secondary | ICD-10-CM | POA: Diagnosis not present

## 2023-04-28 DIAGNOSIS — N179 Acute kidney failure, unspecified: Secondary | ICD-10-CM | POA: Diagnosis not present

## 2023-04-28 DIAGNOSIS — G8310 Monoplegia of lower limb affecting unspecified side: Secondary | ICD-10-CM | POA: Diagnosis not present

## 2023-04-30 DIAGNOSIS — Z79899 Other long term (current) drug therapy: Secondary | ICD-10-CM | POA: Diagnosis not present

## 2023-04-30 DIAGNOSIS — Z5111 Encounter for antineoplastic chemotherapy: Secondary | ICD-10-CM | POA: Diagnosis not present

## 2023-04-30 DIAGNOSIS — Z5112 Encounter for antineoplastic immunotherapy: Secondary | ICD-10-CM | POA: Diagnosis not present

## 2023-04-30 DIAGNOSIS — C9000 Multiple myeloma not having achieved remission: Secondary | ICD-10-CM | POA: Diagnosis not present

## 2023-05-04 DIAGNOSIS — G620 Drug-induced polyneuropathy: Secondary | ICD-10-CM | POA: Diagnosis not present

## 2023-05-04 DIAGNOSIS — C9002 Multiple myeloma in relapse: Secondary | ICD-10-CM | POA: Diagnosis not present

## 2023-05-04 DIAGNOSIS — G8310 Monoplegia of lower limb affecting unspecified side: Secondary | ICD-10-CM | POA: Diagnosis not present

## 2023-05-04 DIAGNOSIS — I5022 Chronic systolic (congestive) heart failure: Secondary | ICD-10-CM | POA: Diagnosis not present

## 2023-05-04 DIAGNOSIS — N179 Acute kidney failure, unspecified: Secondary | ICD-10-CM | POA: Diagnosis not present

## 2023-05-04 DIAGNOSIS — D630 Anemia in neoplastic disease: Secondary | ICD-10-CM | POA: Diagnosis not present

## 2023-05-07 DIAGNOSIS — D701 Agranulocytosis secondary to cancer chemotherapy: Secondary | ICD-10-CM | POA: Diagnosis not present

## 2023-05-07 DIAGNOSIS — C9000 Multiple myeloma not having achieved remission: Secondary | ICD-10-CM | POA: Diagnosis not present

## 2023-05-07 DIAGNOSIS — T451X5A Adverse effect of antineoplastic and immunosuppressive drugs, initial encounter: Secondary | ICD-10-CM | POA: Diagnosis not present

## 2023-05-07 DIAGNOSIS — Z7969 Long term (current) use of other immunomodulators and immunosuppressants: Secondary | ICD-10-CM | POA: Diagnosis not present

## 2023-05-07 DIAGNOSIS — Z9484 Stem cells transplant status: Secondary | ICD-10-CM | POA: Diagnosis not present

## 2023-05-07 DIAGNOSIS — D72810 Lymphocytopenia: Secondary | ICD-10-CM | POA: Diagnosis not present

## 2023-05-07 DIAGNOSIS — Z5112 Encounter for antineoplastic immunotherapy: Secondary | ICD-10-CM | POA: Diagnosis not present

## 2023-05-07 DIAGNOSIS — I5020 Unspecified systolic (congestive) heart failure: Secondary | ICD-10-CM | POA: Diagnosis not present

## 2023-05-07 DIAGNOSIS — Z86711 Personal history of pulmonary embolism: Secondary | ICD-10-CM | POA: Diagnosis not present

## 2023-05-07 DIAGNOSIS — D801 Nonfamilial hypogammaglobulinemia: Secondary | ICD-10-CM | POA: Diagnosis not present

## 2023-05-08 DIAGNOSIS — G620 Drug-induced polyneuropathy: Secondary | ICD-10-CM | POA: Diagnosis not present

## 2023-05-08 DIAGNOSIS — C9002 Multiple myeloma in relapse: Secondary | ICD-10-CM | POA: Diagnosis not present

## 2023-05-08 DIAGNOSIS — D630 Anemia in neoplastic disease: Secondary | ICD-10-CM | POA: Diagnosis not present

## 2023-05-08 DIAGNOSIS — G8310 Monoplegia of lower limb affecting unspecified side: Secondary | ICD-10-CM | POA: Diagnosis not present

## 2023-05-08 DIAGNOSIS — I5022 Chronic systolic (congestive) heart failure: Secondary | ICD-10-CM | POA: Diagnosis not present

## 2023-05-08 DIAGNOSIS — N179 Acute kidney failure, unspecified: Secondary | ICD-10-CM | POA: Diagnosis not present

## 2023-05-19 DIAGNOSIS — Z5112 Encounter for antineoplastic immunotherapy: Secondary | ICD-10-CM | POA: Diagnosis not present

## 2023-05-19 DIAGNOSIS — I5020 Unspecified systolic (congestive) heart failure: Secondary | ICD-10-CM | POA: Diagnosis not present

## 2023-05-19 DIAGNOSIS — D72810 Lymphocytopenia: Secondary | ICD-10-CM | POA: Diagnosis not present

## 2023-05-19 DIAGNOSIS — Z86711 Personal history of pulmonary embolism: Secondary | ICD-10-CM | POA: Diagnosis not present

## 2023-05-19 DIAGNOSIS — Z7901 Long term (current) use of anticoagulants: Secondary | ICD-10-CM | POA: Diagnosis not present

## 2023-05-19 DIAGNOSIS — D801 Nonfamilial hypogammaglobulinemia: Secondary | ICD-10-CM | POA: Diagnosis not present

## 2023-05-19 DIAGNOSIS — T451X5A Adverse effect of antineoplastic and immunosuppressive drugs, initial encounter: Secondary | ICD-10-CM | POA: Diagnosis not present

## 2023-05-19 DIAGNOSIS — Z7969 Long term (current) use of other immunomodulators and immunosuppressants: Secondary | ICD-10-CM | POA: Diagnosis not present

## 2023-05-19 DIAGNOSIS — Z9484 Stem cells transplant status: Secondary | ICD-10-CM | POA: Diagnosis not present

## 2023-05-19 DIAGNOSIS — D701 Agranulocytosis secondary to cancer chemotherapy: Secondary | ICD-10-CM | POA: Diagnosis not present

## 2023-05-19 DIAGNOSIS — C9000 Multiple myeloma not having achieved remission: Secondary | ICD-10-CM | POA: Diagnosis not present

## 2023-05-21 DIAGNOSIS — I5020 Unspecified systolic (congestive) heart failure: Secondary | ICD-10-CM | POA: Diagnosis not present

## 2023-05-21 DIAGNOSIS — C9000 Multiple myeloma not having achieved remission: Secondary | ICD-10-CM | POA: Diagnosis not present

## 2023-05-21 DIAGNOSIS — Z86711 Personal history of pulmonary embolism: Secondary | ICD-10-CM | POA: Diagnosis not present

## 2023-05-21 DIAGNOSIS — Z9484 Stem cells transplant status: Secondary | ICD-10-CM | POA: Diagnosis not present

## 2023-05-26 DIAGNOSIS — C9000 Multiple myeloma not having achieved remission: Secondary | ICD-10-CM | POA: Diagnosis not present

## 2023-05-26 DIAGNOSIS — Z9484 Stem cells transplant status: Secondary | ICD-10-CM | POA: Diagnosis not present

## 2023-05-26 DIAGNOSIS — Z86711 Personal history of pulmonary embolism: Secondary | ICD-10-CM | POA: Diagnosis not present

## 2023-05-26 DIAGNOSIS — D701 Agranulocytosis secondary to cancer chemotherapy: Secondary | ICD-10-CM | POA: Diagnosis not present

## 2023-05-26 DIAGNOSIS — Z7969 Long term (current) use of other immunomodulators and immunosuppressants: Secondary | ICD-10-CM | POA: Diagnosis not present

## 2023-05-26 DIAGNOSIS — T451X5A Adverse effect of antineoplastic and immunosuppressive drugs, initial encounter: Secondary | ICD-10-CM | POA: Diagnosis not present

## 2023-05-26 DIAGNOSIS — D72810 Lymphocytopenia: Secondary | ICD-10-CM | POA: Diagnosis not present

## 2023-05-26 DIAGNOSIS — I5020 Unspecified systolic (congestive) heart failure: Secondary | ICD-10-CM | POA: Diagnosis not present

## 2023-05-26 DIAGNOSIS — Z5112 Encounter for antineoplastic immunotherapy: Secondary | ICD-10-CM | POA: Diagnosis not present

## 2023-05-26 DIAGNOSIS — D801 Nonfamilial hypogammaglobulinemia: Secondary | ICD-10-CM | POA: Diagnosis not present

## 2023-06-02 DIAGNOSIS — Z5112 Encounter for antineoplastic immunotherapy: Secondary | ICD-10-CM | POA: Diagnosis not present

## 2023-06-02 DIAGNOSIS — T451X5A Adverse effect of antineoplastic and immunosuppressive drugs, initial encounter: Secondary | ICD-10-CM | POA: Diagnosis not present

## 2023-06-02 DIAGNOSIS — D72810 Lymphocytopenia: Secondary | ICD-10-CM | POA: Diagnosis not present

## 2023-06-02 DIAGNOSIS — I5020 Unspecified systolic (congestive) heart failure: Secondary | ICD-10-CM | POA: Diagnosis not present

## 2023-06-02 DIAGNOSIS — D801 Nonfamilial hypogammaglobulinemia: Secondary | ICD-10-CM | POA: Diagnosis not present

## 2023-06-02 DIAGNOSIS — D701 Agranulocytosis secondary to cancer chemotherapy: Secondary | ICD-10-CM | POA: Diagnosis not present

## 2023-06-02 DIAGNOSIS — Z86711 Personal history of pulmonary embolism: Secondary | ICD-10-CM | POA: Diagnosis not present

## 2023-06-02 DIAGNOSIS — Z9484 Stem cells transplant status: Secondary | ICD-10-CM | POA: Diagnosis not present

## 2023-06-02 DIAGNOSIS — C9000 Multiple myeloma not having achieved remission: Secondary | ICD-10-CM | POA: Diagnosis not present

## 2023-06-02 DIAGNOSIS — Z7969 Long term (current) use of other immunomodulators and immunosuppressants: Secondary | ICD-10-CM | POA: Diagnosis not present

## 2023-06-09 DIAGNOSIS — Z86711 Personal history of pulmonary embolism: Secondary | ICD-10-CM | POA: Diagnosis not present

## 2023-06-09 DIAGNOSIS — D649 Anemia, unspecified: Secondary | ICD-10-CM | POA: Diagnosis not present

## 2023-06-09 DIAGNOSIS — I5020 Unspecified systolic (congestive) heart failure: Secondary | ICD-10-CM | POA: Diagnosis not present

## 2023-06-09 DIAGNOSIS — C9001 Multiple myeloma in remission: Secondary | ICD-10-CM | POA: Diagnosis not present

## 2023-06-09 DIAGNOSIS — Z9484 Stem cells transplant status: Secondary | ICD-10-CM | POA: Diagnosis not present

## 2023-06-09 DIAGNOSIS — C9000 Multiple myeloma not having achieved remission: Secondary | ICD-10-CM | POA: Diagnosis not present

## 2023-06-09 DIAGNOSIS — Z5112 Encounter for antineoplastic immunotherapy: Secondary | ICD-10-CM | POA: Diagnosis not present

## 2023-06-09 DIAGNOSIS — G620 Drug-induced polyneuropathy: Secondary | ICD-10-CM | POA: Diagnosis not present

## 2023-06-10 ENCOUNTER — Other Ambulatory Visit: Payer: Self-pay

## 2023-06-16 DIAGNOSIS — I5020 Unspecified systolic (congestive) heart failure: Secondary | ICD-10-CM | POA: Diagnosis not present

## 2023-06-16 DIAGNOSIS — C9001 Multiple myeloma in remission: Secondary | ICD-10-CM | POA: Diagnosis not present

## 2023-06-16 DIAGNOSIS — G620 Drug-induced polyneuropathy: Secondary | ICD-10-CM | POA: Diagnosis not present

## 2023-06-16 DIAGNOSIS — Z5112 Encounter for antineoplastic immunotherapy: Secondary | ICD-10-CM | POA: Diagnosis not present

## 2023-06-16 DIAGNOSIS — C9000 Multiple myeloma not having achieved remission: Secondary | ICD-10-CM | POA: Diagnosis not present

## 2023-06-16 DIAGNOSIS — Z9484 Stem cells transplant status: Secondary | ICD-10-CM | POA: Diagnosis not present

## 2023-06-16 DIAGNOSIS — Z86711 Personal history of pulmonary embolism: Secondary | ICD-10-CM | POA: Diagnosis not present

## 2023-06-16 DIAGNOSIS — D649 Anemia, unspecified: Secondary | ICD-10-CM | POA: Diagnosis not present

## 2023-06-26 ENCOUNTER — Other Ambulatory Visit: Payer: Self-pay

## 2023-07-01 DIAGNOSIS — Z86711 Personal history of pulmonary embolism: Secondary | ICD-10-CM | POA: Diagnosis not present

## 2023-07-01 DIAGNOSIS — C9000 Multiple myeloma not having achieved remission: Secondary | ICD-10-CM | POA: Diagnosis not present

## 2023-07-01 DIAGNOSIS — D649 Anemia, unspecified: Secondary | ICD-10-CM | POA: Diagnosis not present

## 2023-07-01 DIAGNOSIS — C9001 Multiple myeloma in remission: Secondary | ICD-10-CM | POA: Diagnosis not present

## 2023-07-01 DIAGNOSIS — Z5112 Encounter for antineoplastic immunotherapy: Secondary | ICD-10-CM | POA: Diagnosis not present

## 2023-07-01 DIAGNOSIS — I5020 Unspecified systolic (congestive) heart failure: Secondary | ICD-10-CM | POA: Diagnosis not present

## 2023-07-01 DIAGNOSIS — G620 Drug-induced polyneuropathy: Secondary | ICD-10-CM | POA: Diagnosis not present

## 2023-07-01 DIAGNOSIS — Z9484 Stem cells transplant status: Secondary | ICD-10-CM | POA: Diagnosis not present

## 2023-07-10 DIAGNOSIS — C9000 Multiple myeloma not having achieved remission: Secondary | ICD-10-CM | POA: Diagnosis not present

## 2023-07-10 DIAGNOSIS — I5022 Chronic systolic (congestive) heart failure: Secondary | ICD-10-CM | POA: Diagnosis not present

## 2023-07-10 DIAGNOSIS — I2510 Atherosclerotic heart disease of native coronary artery without angina pectoris: Secondary | ICD-10-CM | POA: Diagnosis not present

## 2023-07-10 DIAGNOSIS — R079 Chest pain, unspecified: Secondary | ICD-10-CM | POA: Diagnosis not present

## 2023-07-14 DIAGNOSIS — I5020 Unspecified systolic (congestive) heart failure: Secondary | ICD-10-CM | POA: Diagnosis not present

## 2023-07-14 DIAGNOSIS — D801 Nonfamilial hypogammaglobulinemia: Secondary | ICD-10-CM | POA: Diagnosis not present

## 2023-07-14 DIAGNOSIS — Z9484 Stem cells transplant status: Secondary | ICD-10-CM | POA: Diagnosis not present

## 2023-07-14 DIAGNOSIS — M25552 Pain in left hip: Secondary | ICD-10-CM | POA: Diagnosis not present

## 2023-07-14 DIAGNOSIS — Z86711 Personal history of pulmonary embolism: Secondary | ICD-10-CM | POA: Diagnosis not present

## 2023-07-14 DIAGNOSIS — G620 Drug-induced polyneuropathy: Secondary | ICD-10-CM | POA: Diagnosis not present

## 2023-07-14 DIAGNOSIS — Z5112 Encounter for antineoplastic immunotherapy: Secondary | ICD-10-CM | POA: Diagnosis not present

## 2023-07-14 DIAGNOSIS — C9000 Multiple myeloma not having achieved remission: Secondary | ICD-10-CM | POA: Diagnosis not present

## 2023-07-14 DIAGNOSIS — D649 Anemia, unspecified: Secondary | ICD-10-CM | POA: Diagnosis not present

## 2023-07-14 DIAGNOSIS — Z5111 Encounter for antineoplastic chemotherapy: Secondary | ICD-10-CM | POA: Diagnosis not present

## 2023-07-17 ENCOUNTER — Other Ambulatory Visit: Payer: Self-pay

## 2023-07-28 DIAGNOSIS — Z5111 Encounter for antineoplastic chemotherapy: Secondary | ICD-10-CM | POA: Diagnosis not present

## 2023-07-28 DIAGNOSIS — Z86711 Personal history of pulmonary embolism: Secondary | ICD-10-CM | POA: Diagnosis not present

## 2023-07-28 DIAGNOSIS — Z5112 Encounter for antineoplastic immunotherapy: Secondary | ICD-10-CM | POA: Diagnosis not present

## 2023-07-28 DIAGNOSIS — D649 Anemia, unspecified: Secondary | ICD-10-CM | POA: Diagnosis not present

## 2023-07-28 DIAGNOSIS — D801 Nonfamilial hypogammaglobulinemia: Secondary | ICD-10-CM | POA: Diagnosis not present

## 2023-07-28 DIAGNOSIS — M25552 Pain in left hip: Secondary | ICD-10-CM | POA: Diagnosis not present

## 2023-07-28 DIAGNOSIS — Z9484 Stem cells transplant status: Secondary | ICD-10-CM | POA: Diagnosis not present

## 2023-07-28 DIAGNOSIS — C9000 Multiple myeloma not having achieved remission: Secondary | ICD-10-CM | POA: Diagnosis not present

## 2023-07-28 DIAGNOSIS — G620 Drug-induced polyneuropathy: Secondary | ICD-10-CM | POA: Diagnosis not present

## 2023-07-28 DIAGNOSIS — I5020 Unspecified systolic (congestive) heart failure: Secondary | ICD-10-CM | POA: Diagnosis not present

## 2023-07-30 DIAGNOSIS — I110 Hypertensive heart disease with heart failure: Secondary | ICD-10-CM | POA: Diagnosis not present

## 2023-07-30 DIAGNOSIS — Z0001 Encounter for general adult medical examination with abnormal findings: Secondary | ICD-10-CM | POA: Diagnosis not present

## 2023-07-30 DIAGNOSIS — D638 Anemia in other chronic diseases classified elsewhere: Secondary | ICD-10-CM | POA: Diagnosis not present

## 2023-07-30 DIAGNOSIS — G620 Drug-induced polyneuropathy: Secondary | ICD-10-CM | POA: Diagnosis not present

## 2023-07-30 DIAGNOSIS — I5022 Chronic systolic (congestive) heart failure: Secondary | ICD-10-CM | POA: Diagnosis not present

## 2023-08-11 DIAGNOSIS — Z5111 Encounter for antineoplastic chemotherapy: Secondary | ICD-10-CM | POA: Diagnosis not present

## 2023-08-11 DIAGNOSIS — C9000 Multiple myeloma not having achieved remission: Secondary | ICD-10-CM | POA: Diagnosis not present

## 2023-08-11 DIAGNOSIS — Z5112 Encounter for antineoplastic immunotherapy: Secondary | ICD-10-CM | POA: Diagnosis not present

## 2023-08-11 DIAGNOSIS — D801 Nonfamilial hypogammaglobulinemia: Secondary | ICD-10-CM | POA: Diagnosis not present

## 2023-08-13 DIAGNOSIS — I5020 Unspecified systolic (congestive) heart failure: Secondary | ICD-10-CM | POA: Diagnosis not present

## 2023-08-13 DIAGNOSIS — I5022 Chronic systolic (congestive) heart failure: Secondary | ICD-10-CM | POA: Diagnosis not present

## 2023-08-13 DIAGNOSIS — I2510 Atherosclerotic heart disease of native coronary artery without angina pectoris: Secondary | ICD-10-CM | POA: Diagnosis not present

## 2023-08-13 DIAGNOSIS — R079 Chest pain, unspecified: Secondary | ICD-10-CM | POA: Diagnosis not present

## 2023-08-25 DIAGNOSIS — Z5112 Encounter for antineoplastic immunotherapy: Secondary | ICD-10-CM | POA: Diagnosis not present

## 2023-08-25 DIAGNOSIS — Z5111 Encounter for antineoplastic chemotherapy: Secondary | ICD-10-CM | POA: Diagnosis not present

## 2023-08-25 DIAGNOSIS — D801 Nonfamilial hypogammaglobulinemia: Secondary | ICD-10-CM | POA: Diagnosis not present

## 2023-08-25 DIAGNOSIS — C9000 Multiple myeloma not having achieved remission: Secondary | ICD-10-CM | POA: Diagnosis not present

## 2023-09-03 ENCOUNTER — Other Ambulatory Visit: Payer: Self-pay | Admitting: Cardiovascular Disease

## 2023-09-03 DIAGNOSIS — I5020 Unspecified systolic (congestive) heart failure: Secondary | ICD-10-CM

## 2023-09-04 ENCOUNTER — Other Ambulatory Visit: Payer: Self-pay

## 2023-09-08 DIAGNOSIS — C9000 Multiple myeloma not having achieved remission: Secondary | ICD-10-CM | POA: Diagnosis not present

## 2023-09-08 DIAGNOSIS — D801 Nonfamilial hypogammaglobulinemia: Secondary | ICD-10-CM | POA: Diagnosis not present

## 2023-09-08 DIAGNOSIS — Z5111 Encounter for antineoplastic chemotherapy: Secondary | ICD-10-CM | POA: Diagnosis not present

## 2023-09-08 DIAGNOSIS — Z5112 Encounter for antineoplastic immunotherapy: Secondary | ICD-10-CM | POA: Diagnosis not present

## 2023-09-11 IMAGING — CR DG CHEST 2V
2 series · 2 of 2 positions shown · non-contrast
Comparison: 09/02/2016

CLINICAL DATA: Cough and fever for 2 days.

EXAM:
CHEST - 2 VIEW

[w chest pa]
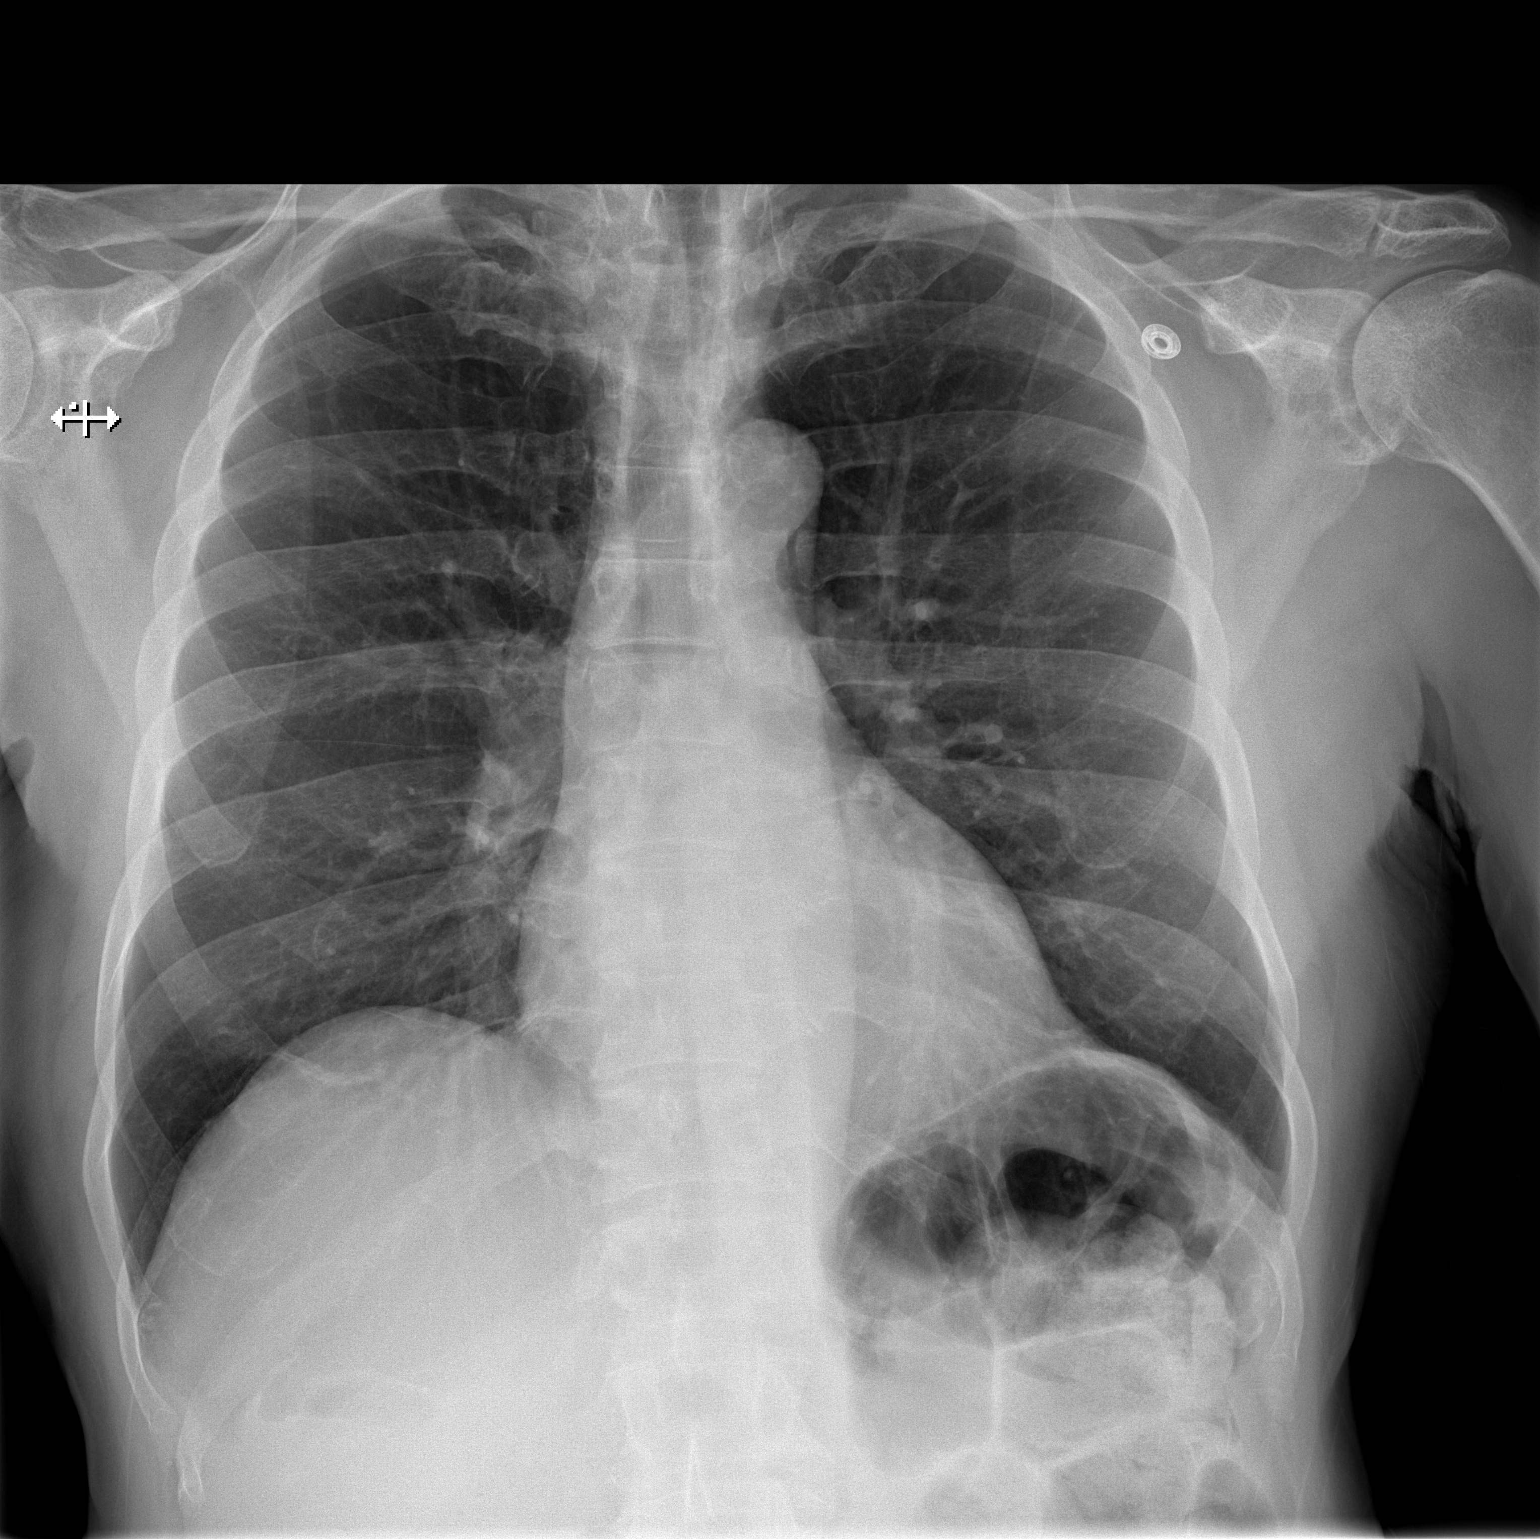

[w chest lat]
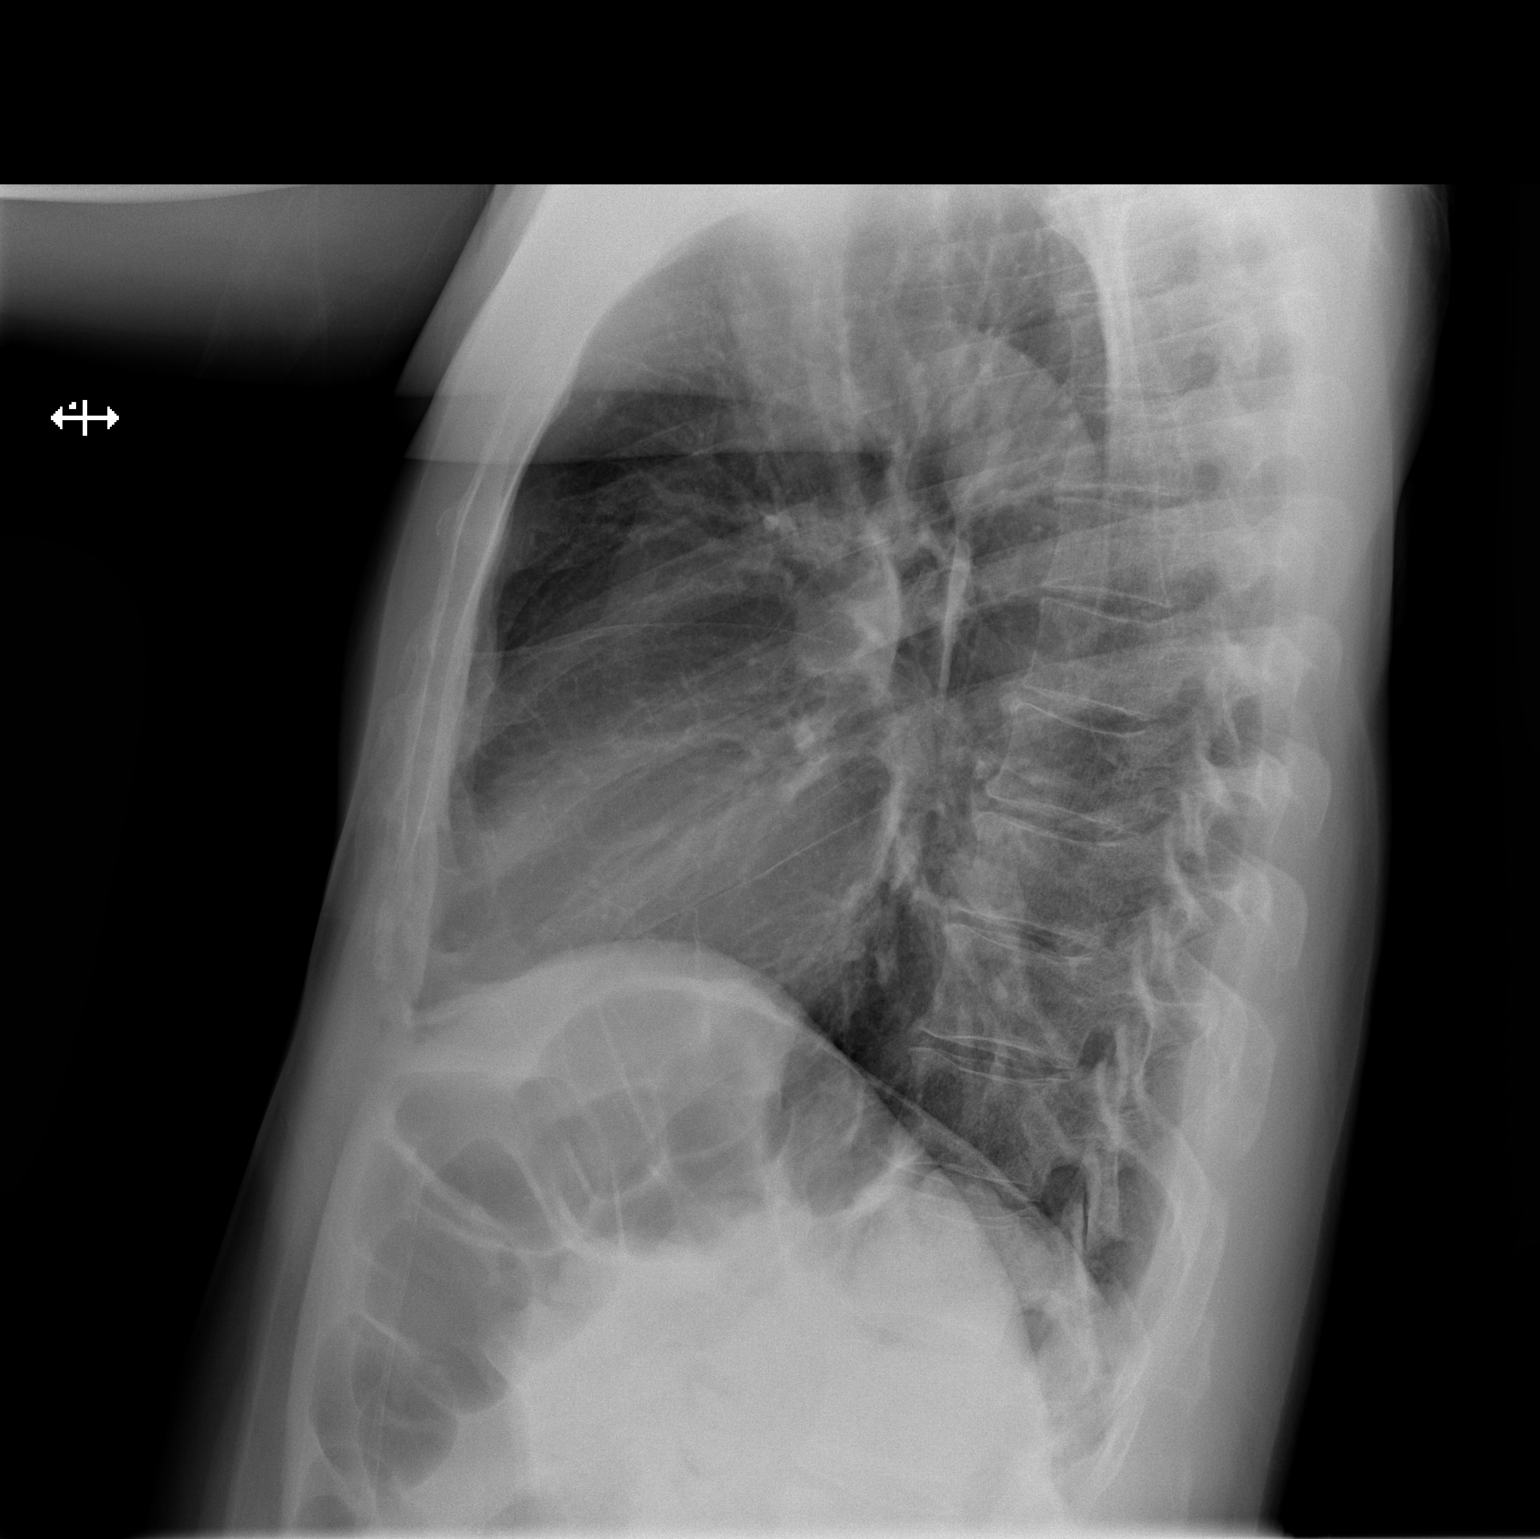

[2 of 2 positions shown; findings below may reference images not displayed]

FINDINGS: The heart size and mediastinal contours are within normal limits.
Both lungs are clear. The visualized skeletal structures are
unremarkable.
IMPRESSION: No active cardiopulmonary disease.

## 2023-09-22 DIAGNOSIS — Z5111 Encounter for antineoplastic chemotherapy: Secondary | ICD-10-CM | POA: Diagnosis not present

## 2023-09-22 DIAGNOSIS — Z5112 Encounter for antineoplastic immunotherapy: Secondary | ICD-10-CM | POA: Diagnosis not present

## 2023-09-22 DIAGNOSIS — D801 Nonfamilial hypogammaglobulinemia: Secondary | ICD-10-CM | POA: Diagnosis not present

## 2023-09-22 DIAGNOSIS — C9000 Multiple myeloma not having achieved remission: Secondary | ICD-10-CM | POA: Diagnosis not present

## 2023-09-25 ENCOUNTER — Other Ambulatory Visit: Payer: Self-pay

## 2023-10-06 DIAGNOSIS — Z5112 Encounter for antineoplastic immunotherapy: Secondary | ICD-10-CM | POA: Diagnosis not present

## 2023-10-06 DIAGNOSIS — I2510 Atherosclerotic heart disease of native coronary artery without angina pectoris: Secondary | ICD-10-CM | POA: Diagnosis not present

## 2023-10-06 DIAGNOSIS — D649 Anemia, unspecified: Secondary | ICD-10-CM | POA: Diagnosis not present

## 2023-10-06 DIAGNOSIS — F5101 Primary insomnia: Secondary | ICD-10-CM | POA: Diagnosis not present

## 2023-10-06 DIAGNOSIS — R002 Palpitations: Secondary | ICD-10-CM | POA: Diagnosis not present

## 2023-10-06 DIAGNOSIS — C9000 Multiple myeloma not having achieved remission: Secondary | ICD-10-CM | POA: Diagnosis not present

## 2023-10-06 DIAGNOSIS — D801 Nonfamilial hypogammaglobulinemia: Secondary | ICD-10-CM | POA: Diagnosis not present

## 2023-10-06 DIAGNOSIS — I5020 Unspecified systolic (congestive) heart failure: Secondary | ICD-10-CM | POA: Diagnosis not present

## 2023-10-06 DIAGNOSIS — Z7969 Long term (current) use of other immunomodulators and immunosuppressants: Secondary | ICD-10-CM | POA: Diagnosis not present

## 2023-10-12 DIAGNOSIS — C9000 Multiple myeloma not having achieved remission: Secondary | ICD-10-CM | POA: Diagnosis not present

## 2023-10-12 DIAGNOSIS — R002 Palpitations: Secondary | ICD-10-CM | POA: Diagnosis not present

## 2023-10-12 DIAGNOSIS — I2510 Atherosclerotic heart disease of native coronary artery without angina pectoris: Secondary | ICD-10-CM | POA: Diagnosis not present

## 2023-10-12 DIAGNOSIS — I5020 Unspecified systolic (congestive) heart failure: Secondary | ICD-10-CM | POA: Diagnosis not present

## 2023-10-13 DIAGNOSIS — I493 Ventricular premature depolarization: Secondary | ICD-10-CM | POA: Diagnosis not present

## 2023-10-13 DIAGNOSIS — I498 Other specified cardiac arrhythmias: Secondary | ICD-10-CM | POA: Diagnosis not present

## 2023-10-13 DIAGNOSIS — R000 Tachycardia, unspecified: Secondary | ICD-10-CM | POA: Diagnosis not present

## 2023-10-20 DIAGNOSIS — Z7969 Long term (current) use of other immunomodulators and immunosuppressants: Secondary | ICD-10-CM | POA: Diagnosis not present

## 2023-10-20 DIAGNOSIS — Z5112 Encounter for antineoplastic immunotherapy: Secondary | ICD-10-CM | POA: Diagnosis not present

## 2023-10-20 DIAGNOSIS — C9000 Multiple myeloma not having achieved remission: Secondary | ICD-10-CM | POA: Diagnosis not present

## 2023-10-20 DIAGNOSIS — I5020 Unspecified systolic (congestive) heart failure: Secondary | ICD-10-CM | POA: Diagnosis not present

## 2023-10-20 DIAGNOSIS — I2510 Atherosclerotic heart disease of native coronary artery without angina pectoris: Secondary | ICD-10-CM | POA: Diagnosis not present

## 2023-10-20 DIAGNOSIS — F5101 Primary insomnia: Secondary | ICD-10-CM | POA: Diagnosis not present

## 2023-10-20 DIAGNOSIS — D649 Anemia, unspecified: Secondary | ICD-10-CM | POA: Diagnosis not present

## 2023-10-20 DIAGNOSIS — R002 Palpitations: Secondary | ICD-10-CM | POA: Diagnosis not present

## 2023-10-20 DIAGNOSIS — D801 Nonfamilial hypogammaglobulinemia: Secondary | ICD-10-CM | POA: Diagnosis not present

## 2023-10-21 ENCOUNTER — Other Ambulatory Visit: Payer: Self-pay

## 2023-10-29 DIAGNOSIS — I5020 Unspecified systolic (congestive) heart failure: Secondary | ICD-10-CM | POA: Diagnosis not present

## 2023-10-29 DIAGNOSIS — I2510 Atherosclerotic heart disease of native coronary artery without angina pectoris: Secondary | ICD-10-CM | POA: Diagnosis not present

## 2023-10-29 DIAGNOSIS — C9000 Multiple myeloma not having achieved remission: Secondary | ICD-10-CM | POA: Diagnosis not present

## 2023-10-29 DIAGNOSIS — I2584 Coronary atherosclerosis due to calcified coronary lesion: Secondary | ICD-10-CM | POA: Diagnosis not present

## 2023-11-03 DIAGNOSIS — F5101 Primary insomnia: Secondary | ICD-10-CM | POA: Diagnosis not present

## 2023-11-03 DIAGNOSIS — I2510 Atherosclerotic heart disease of native coronary artery without angina pectoris: Secondary | ICD-10-CM | POA: Diagnosis not present

## 2023-11-03 DIAGNOSIS — Z5112 Encounter for antineoplastic immunotherapy: Secondary | ICD-10-CM | POA: Diagnosis not present

## 2023-11-03 DIAGNOSIS — I5020 Unspecified systolic (congestive) heart failure: Secondary | ICD-10-CM | POA: Diagnosis not present

## 2023-11-03 DIAGNOSIS — D649 Anemia, unspecified: Secondary | ICD-10-CM | POA: Diagnosis not present

## 2023-11-03 DIAGNOSIS — D801 Nonfamilial hypogammaglobulinemia: Secondary | ICD-10-CM | POA: Diagnosis not present

## 2023-11-03 DIAGNOSIS — R002 Palpitations: Secondary | ICD-10-CM | POA: Diagnosis not present

## 2023-11-03 DIAGNOSIS — C9000 Multiple myeloma not having achieved remission: Secondary | ICD-10-CM | POA: Diagnosis not present

## 2023-11-03 DIAGNOSIS — Z7969 Long term (current) use of other immunomodulators and immunosuppressants: Secondary | ICD-10-CM | POA: Diagnosis not present

## 2023-11-09 DIAGNOSIS — Z86711 Personal history of pulmonary embolism: Secondary | ICD-10-CM | POA: Diagnosis not present

## 2023-11-09 DIAGNOSIS — I428 Other cardiomyopathies: Secondary | ICD-10-CM | POA: Diagnosis not present

## 2023-11-09 DIAGNOSIS — I2510 Atherosclerotic heart disease of native coronary artery without angina pectoris: Secondary | ICD-10-CM | POA: Diagnosis not present

## 2023-11-09 DIAGNOSIS — Z7901 Long term (current) use of anticoagulants: Secondary | ICD-10-CM | POA: Diagnosis not present

## 2023-11-09 DIAGNOSIS — R001 Bradycardia, unspecified: Secondary | ICD-10-CM | POA: Diagnosis not present

## 2023-11-09 DIAGNOSIS — I5022 Chronic systolic (congestive) heart failure: Secondary | ICD-10-CM | POA: Diagnosis not present

## 2023-11-09 DIAGNOSIS — Z86718 Personal history of other venous thrombosis and embolism: Secondary | ICD-10-CM | POA: Diagnosis not present

## 2023-11-17 ENCOUNTER — Encounter (HOSPITAL_COMMUNITY): Payer: Self-pay

## 2023-11-17 ENCOUNTER — Telehealth (HOSPITAL_COMMUNITY): Payer: Self-pay

## 2023-11-17 DIAGNOSIS — Z86711 Personal history of pulmonary embolism: Secondary | ICD-10-CM | POA: Diagnosis not present

## 2023-11-17 DIAGNOSIS — D801 Nonfamilial hypogammaglobulinemia: Secondary | ICD-10-CM | POA: Diagnosis not present

## 2023-11-17 DIAGNOSIS — I5020 Unspecified systolic (congestive) heart failure: Secondary | ICD-10-CM | POA: Diagnosis not present

## 2023-11-17 DIAGNOSIS — Z9484 Stem cells transplant status: Secondary | ICD-10-CM | POA: Diagnosis not present

## 2023-11-17 DIAGNOSIS — C9000 Multiple myeloma not having achieved remission: Secondary | ICD-10-CM | POA: Diagnosis not present

## 2023-11-17 DIAGNOSIS — M25552 Pain in left hip: Secondary | ICD-10-CM | POA: Diagnosis not present

## 2023-11-17 DIAGNOSIS — Z5112 Encounter for antineoplastic immunotherapy: Secondary | ICD-10-CM | POA: Diagnosis not present

## 2023-11-17 DIAGNOSIS — G620 Drug-induced polyneuropathy: Secondary | ICD-10-CM | POA: Diagnosis not present

## 2023-11-17 NOTE — Telephone Encounter (Signed)
Attempted to call patient in regards to Cardiac Rehab - LM on VM   Sent letter 

## 2023-11-17 NOTE — Telephone Encounter (Signed)
 Outside/paper referral received by Dr. Roseanne Reno from [facility]. Will fax over Physician order and request further documents. Insurance benefits and eligibility to be determined.

## 2023-11-18 ENCOUNTER — Telehealth (HOSPITAL_COMMUNITY): Payer: Self-pay

## 2023-11-18 ENCOUNTER — Other Ambulatory Visit: Payer: Self-pay

## 2023-11-18 NOTE — Telephone Encounter (Signed)
 Pt returned phone call regarding cardiac rehab, pt is going to call his secondary insurance [employer] to see what they will cover and will call back if interested.

## 2023-11-18 NOTE — Telephone Encounter (Signed)
 Pt is interested in the cardiac rehab program. Will f/u after review.

## 2023-11-18 NOTE — Telephone Encounter (Signed)
 Pt insurance is active and benefits verified through Medicare a/b Co-pay 0, DED $257/$257 met, out of pocket 0/0 met, co-insurance 20%. no pre-authorization required. Passport, 11/18/2023@10 :43, REF# [id number]   TCR/ICR? ICR Visit(date of service)limitation? 72 Can multiple codes be used on the same date of service/visit?(IF ITS A LIMIT) yes    Is this a lifetime maximum or an annual maximum? annual Has the member used any of these services to date? no Is there a time limit (weeks/months) on start of program and/or program completion? no

## 2023-12-01 DIAGNOSIS — I5020 Unspecified systolic (congestive) heart failure: Secondary | ICD-10-CM | POA: Diagnosis not present

## 2023-12-01 DIAGNOSIS — C9000 Multiple myeloma not having achieved remission: Secondary | ICD-10-CM | POA: Diagnosis not present

## 2023-12-01 DIAGNOSIS — D801 Nonfamilial hypogammaglobulinemia: Secondary | ICD-10-CM | POA: Diagnosis not present

## 2023-12-01 DIAGNOSIS — Z5112 Encounter for antineoplastic immunotherapy: Secondary | ICD-10-CM | POA: Diagnosis not present

## 2023-12-01 DIAGNOSIS — M25552 Pain in left hip: Secondary | ICD-10-CM | POA: Diagnosis not present

## 2023-12-01 DIAGNOSIS — Z86711 Personal history of pulmonary embolism: Secondary | ICD-10-CM | POA: Diagnosis not present

## 2023-12-01 DIAGNOSIS — G620 Drug-induced polyneuropathy: Secondary | ICD-10-CM | POA: Diagnosis not present

## 2023-12-01 DIAGNOSIS — Z9484 Stem cells transplant status: Secondary | ICD-10-CM | POA: Diagnosis not present

## 2023-12-03 ENCOUNTER — Telehealth (HOSPITAL_COMMUNITY): Payer: Self-pay

## 2023-12-03 NOTE — Telephone Encounter (Signed)
 Received fax from [facility] stating patient is cleared to start cardiac rehab and does not need to wait for appt on 11/24. Will pass to nurse for review.

## 2023-12-05 ENCOUNTER — Other Ambulatory Visit: Payer: Self-pay

## 2023-12-08 ENCOUNTER — Other Ambulatory Visit: Payer: Self-pay

## 2023-12-15 DIAGNOSIS — M1612 Unilateral primary osteoarthritis, left hip: Secondary | ICD-10-CM | POA: Diagnosis not present

## 2023-12-15 DIAGNOSIS — C9000 Multiple myeloma not having achieved remission: Secondary | ICD-10-CM | POA: Diagnosis not present

## 2023-12-15 DIAGNOSIS — D649 Anemia, unspecified: Secondary | ICD-10-CM | POA: Diagnosis not present

## 2023-12-15 DIAGNOSIS — G620 Drug-induced polyneuropathy: Secondary | ICD-10-CM | POA: Diagnosis not present

## 2023-12-15 DIAGNOSIS — M25552 Pain in left hip: Secondary | ICD-10-CM | POA: Diagnosis not present

## 2023-12-15 DIAGNOSIS — Z5112 Encounter for antineoplastic immunotherapy: Secondary | ICD-10-CM | POA: Diagnosis not present

## 2023-12-15 DIAGNOSIS — Z5111 Encounter for antineoplastic chemotherapy: Secondary | ICD-10-CM | POA: Diagnosis not present

## 2023-12-15 DIAGNOSIS — D72819 Decreased white blood cell count, unspecified: Secondary | ICD-10-CM | POA: Diagnosis not present

## 2023-12-15 DIAGNOSIS — D801 Nonfamilial hypogammaglobulinemia: Secondary | ICD-10-CM | POA: Diagnosis not present

## 2023-12-15 DIAGNOSIS — M8560 Other cyst of bone, unspecified site: Secondary | ICD-10-CM | POA: Diagnosis not present

## 2023-12-15 DIAGNOSIS — Z9484 Stem cells transplant status: Secondary | ICD-10-CM | POA: Diagnosis not present

## 2023-12-27 NOTE — Progress Notes (Addendum)
 [facility] RETURN CLINIC VISIT:  PCP: Zachary Conger, MD Date of visit: 12/28/2023 Primary Cardiologist (cardio-onc): Dr. Irven // Rosina Ahle, PA-C  Reason for consultation: EF decline due to cardiotoxic chemo  CV History: 1.HFrEF s/2 NICM   -first HF episode in March 2023 -EF dropped to 15% with most recent relapse after course of DCEP -Recovery to 45-50% on heart failure meds 2. History of PE/DVT -- on eliquis 2.5mg  bid  - in s/o immobilization/hospitalization  Other Medical Hx: 1.Myeloma (Free Kappa Light Chain Disease) diagnosed 09/2016, s/p ASCT 2019, Relapse 01/2022  - He was treated with Dara Cardea Dex with therapy 02/19/2021 -04/2021 however therapy had to be stopped due to severe reduction in LVEF. Carfilzomib  therapy was stopped and LVEF improved to low normal. He was then treated with DCEP with cytoxan  11/2022 however LVEF again dropped to 15% and therapy was discontinued.  That hospital course was also complicated by septic shock and bacteremia requiring his GDMT to be held.  His GDMT was restarted and up-titrated, with most recent TTE on 03/02/23 showing LVEF 45-50%.   - now on BiTE therapy: teclistamab  OV 01/23/23: Mr. Toman presents today, accompanied by his wife. He reports feeling really well from a cv standpoint. He has no concerns today. He does endorse Discomfort in chest, a few times a week. 3/10 uncomfortable and noticeable but not painful. Lasts a few seconds to a couple minutes. Stretching makes it better. It occurs randomly, sometimes sitting other times walking, but not consistently brought on by activity. Doesn't limit him. Mr. Alcindor was able to complete 2 miles walking one day since discharge, but typically just does 10 laps around the house. Walking in the cold affects breathing. Pt has been tolerating the HF meds without any hypotension or side effects. He has been trying to limit sodium, biggest culprit is pre-packaged foods but is reading nutrition  labels. Pt admits to drinking sodas regularly, but trying to cut back. For fluids, he drinks 2-3 17oz bottles of water, 1-1.5 cup coffee, 6oz OJ. Mr. Feutz has had consistent right foot swelling since MM diagnosis but no other LE edema.  Recent hospitalization: 03/05/23-03/11/23- 1 week of fatigue, poor appetite, and poor PO intake. Upon presentation to clinic on 1/30 patient found to be hypotensive with BP in the 80s over 30s. HR in 90s-100s, satting well on room air.  Labs notable for Hgb 9.0 (down from 11.1 on 12/26), creatinine 2.56 (up from 0.98 12/26), lactic acid 0.9. Due to hypotension and AKI, patient's GDMT was held and has not been restarted.  Last visit 05/21/23: He has minimal/stable swelling in right ankle, no lasix  use. One hospitalization in March was given iv lasix  due to abdominal distention and wt gain. Otherwise has not noticed any fluid retention issues. Reports his breathing is great. He is regularly walking 2.5 miles a few times per week, often 6-7k steps per day. With his dog, he will run across the bridge in his neighborhood to get HR up. Also trying to regain muscle mass. They have cut out caffeine (coffee and soda, still drinks decaf coffee) and this has helped with palpitations. HR has been fast, since stopping metoprolol.   Last visit 07/10/23: Mr. Todisco is here for follow-up. After last visit, we have since restarted Losartan , metoprolol, and spiro. He has had no cardiac concerns since last visit. Pt shares that he has a lower central burning chest pain, will take a couple tums or a prescription liquid and it goes away.  He says he never has chest pain when he's active (walking dog, etc), but sometimes when he's stressed, upset, or excited. This occurs a few times per month. Denies any exertional chest pain, palpitations, shortness of breath, dizziness, lightheadedness or near syncope. No orthopnea/PND, abdominal bloating or early satiety, leg edema. No TIA/Stroke like symptoms. Has  not had any episodes of melena/tarry stools, epistasis while on OAC.  Interval hx: TTE showed EF 30-35%, global HK, GLS -11%. Neg stress test for inducible ischemia however low EF at baseline. He has not yet scheduled CCTA.  Last visit 10/12/23: Mr. Ziller presents to clinic with concerns about his drop in EF and faster heart rates. Mr. Whittlesey has been out of the metoprolol for 2-3 days which may explain his HR today in 90s-100s. Pt also has been out of the spironolactone  for a few months. He denies any new or worsening cardiac concerns. He continues to note chest discomfort s/2 acid reflux every few days, TUMS resolves this. He is able to jog length of football field with his dogs, no exertional symptoms. HR gets up to 130s.   Interval history: single vessel obstructive  CAD. - Severe mid RCA stenosis successfully revascularized with IVUS-guided PCI with a single Xience DES. Mild non-obstructive disease elsewhere in the coronary artery tree Normal LV filling pressures No aortic stenosis Likely mixed ischemic and non-ischemic cardiomyopathy --plavix x6 months, with eliquis, no asa.   Today's visit 12/28/23: Mr. Zapata reports feeling well from a cv standpoint. He says he felt immediately better after getting stent, has not had any chest discomfort since then. He'll still notice a premature beat but that's it. Pt has been doing 3-4 miles of walking, strength training. He reports no cardiac limitations, and would like to increase intensity of his work outs. Only progelm is his hip pain that limits him. He's interested in starting cardiac rehab, but they won't let him start until EKG and cardiac clearance. I've already sent one fax to them that he can begin, will send again today. Pt's goal is to get his weight to 185lb, states that is his ideal weight when he felt the best. He's cut out red meat and alcohol. He limits salt and fluids. He has not required any lasix  in months, maybe even a year. Pt denies  chest pain, shortness of breath, prolonged palpitations, dizziness, lightheadedness, pre-syncope/syncope, orthopnea/PND, LE swelling. No bleeding issues on plavix/eliquis.    Home BP: 115/68 HR as low as 58, typically 70 BP Readings from Last 3 Encounters:  12/28/23 155/67  12/15/23 (!) 104/50  12/01/23 128/70   Cardiac risk factors:  None of the following: HTN, DM, Dyslipidemia, smoking, family history of premature CAD   RAASi: entresto  24-26mg  bid BB: metoprolol 25mg  SGLT2i: no (avoiding d/2 hx of UTIs) MRA: spironolactone  12.5 OAC: yes, 2.5mg  bid  Required SL nitro: no    Home BP: 120/70 BP Readings from Last 3 Encounters:  12/28/23 155/67  12/15/23 (!) 104/50  12/01/23 128/70  HR - 80s  Recent CV ER visits or hospitalizations: yes   Exercise: see above Functional capacity is good Compliant taking medications: yes  Limiting salt in diet: yes Diuretic use: none Weights: has gained some weight, is trying to put on muscle, eating more Wt Readings from Last 3 Encounters:  12/28/23 90.4 kg (199 lb 3.2 oz)  12/15/23 90.6 kg (199 lb 12.8 oz)  12/01/23 89.5 kg (197 lb 4.8 oz)   Oncologic treatment history: VRd (started 01/2017) (held 05/24/2017 in  preparation for ASCT) Relapse: Daratumumab  1800 mg subcutaneous injection weekly started on February 19, 2021. Dexamethasone  weekly started on February 19, 2021. Carfilzomib  20 mg/m weekly started on February 19, 2021. Therapy stopped in March 2023 for presumed cardiomyopathy complication.  DCEP 11/04/22 (complicated by HF, NF and pseudomonas bacteremia)  Current Outpatient Medications  Medication Instructions  . acyclovir  (ZOVIRAX ) 400 mg, oral, 2 times daily  . atorvastatin (LIPITOR) 40 mg, oral, At bedtime  . carboxymethylcellulose (REFRESH PLUS) 0.5 % dpet 1 drop, 4 times daily PRN  . cholecalciferol (VITAMIN D3) 2,000 Units, oral, Daily  . clopidogreL (PLAVIX) 75 mg, oral, Daily  . clopidogreL (PLAVIX) 75 mg, oral, Daily   . coenzyme Q-10 100 mg, Daily  . Eliquis 2.5 mg, oral, 2 times daily  . folic acid  (FOLVITE ) 1 mg  . gabapentin  (NEURONTIN ) 300 mg, oral, 2 times daily  . metoprolol succinate (TOPROL XL) 25 mg, oral, Daily  . multivitamin (THERAGRAN) tab tablet 1 tablet, oral, Daily  . sacubitriL -valsartan  (Entresto ) 24-26 mg per tablet 1 tablet, oral, 2 times daily  . senna (SENOKOT) 8.6 mg tablet 1 tablet, Daily  . sildenafiL  (VIAGRA ) 50 mg  . sodium chloride  (OCEAN) 0.65 % nasal spray Administer into affected nostril(s).  . spironolactone  (ALDACTONE ) 12.5 mg, oral, Daily  . sulfamethoxazole -trimethoprim (BACTRIM  DS) 800-160 mg per tablet 1 tablet, oral, Every Mon, Wed & Fri  . traZODone  (DESYREL ) 100 mg, oral, At bedtime, Start with 1/2 tablet (50 mg) by mouth at bedtime.    Physical Exam: Vitals:   12/28/23 1007  BP: 155/67  Pulse: 70  Resp: 16  Temp: 98 F (36.7 C)  SpO2: 100%  GENERAL: The patient appears well and is in no distress. Body habitus is normal NECK:  Jugular venous distention: none and the thyroid  is not enlarged.  CHEST: The lungs are clear The respiratory effort is normal.  HEART: The pulse is normal. Heart sounds: normal s1 s2. No rubs are present. Murmurs: none VASCULAR: Carotid upstrokes are normal  Pedal pulses are present and normal. ABDOMEN: There is no hepatomegaly; masses. No tenderness noted.  MUSCULOSKELETAL: no chest wall tenderness  EXTREMITIES: Trace edema SKIN: There is no pallor, jaundice, ulcers and the skin is warm and dry NEUROLOGIC: Patient is alert and oriented; no focal deficits are noted. PSYCHIATRIC: mood and affect are appropriate  Patient Active Problem List  Diagnosis  . Multiple myeloma (HCC)  . Pre-transplant evaluation for stem cell transplant  . Autologous donor of stem cells  . Multiple myeloma in remission (HCC)  . 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 H/O autologous stem cell transplant (HCC)  . Drug-induced polyneuropathy (HCC)  . Multiple  myeloma not having achieved remission    (CMD)  . History of pulmonary embolism  . Systolic CHF with reduced left ventricular function, NYHA class 2  . Hypercalcemia of malignancy  . Coronary artery calcification seen on CT scan  . Hypogammaglobulinemia (CMD)  . Abnormal CT scan, heart  . LV dysfunction  . Abnormal cardiovascular stress test    Cardiovascular Studies: Most Recent echo 08/13/23: The left ventricle is moderate to severely dilated  LVEDV 88 ml-m2 by 3D Volumes  with mild eccentric hypertrophy.  Left ventricular systolic function is moderately reduced; LVEF = 30-35%.  There is moderate global hypokinesis of the left ventricle.  The right ventricle is normal in size and function.  There is no significant valvular stenosis or regurgitation.  The aortic sinus is mildly dilated measuring 4.2 cm.  Compared to 03-2023 study, the LVEF has declined.   Prior ECHO 11/2022: severe dilation, 6.8 idd, global HK< ef 15%, mild mr and tr  Labs:  Lab Results  Component Value Date   WBC 3.60 (L) 12/15/2023   HGB 12.2 (L) 12/15/2023   HCT 34.8 (L) 12/15/2023   PLT 257 12/15/2023   TRIG 74 03/31/2023   ALT 16 12/15/2023   AST 23 12/15/2023   NA 139 12/15/2023   K 4.0 12/15/2023   CL 106 12/15/2023   CREATININE 0.92 12/15/2023   BUN 20 12/15/2023   CO2 29 12/15/2023   INR 1.2 04/02/2023   HGBA1C 6.4 (H) 11/03/2022   BNP <50 11/03/2023    B-Type Natriuretic Peptide (BNP)  Date Value Ref Range Status  11/03/2023 <50 <100 pg/mL Final  06/02/2023 107 (H) <100 pg/mL Final  03/07/2023 1,011 (H) <100 pg/mL Final  01/23/2023 180 (H) <100 pg/mL Final     Assessment/Plan: 1. Systolic CHF with reduced left ventricular function, NYHA class 2   2. Coronary artery disease involving native coronary artery of native heart without angina pectoris      HFrEF -- previously as low as <15%, EF did recover to 45-50% with good GDMT. Pt had been off/on GMDT for various reasons. Most recent  30-35%, dilated CM with GHK. Euvolemic, well compensated. NYHA I. Initial EF decline believed to be caused by chemo, as described above, per Dr. Irven. Likely combined ischemic and nonischemic cardiomyopathy, now s/p PCI to RCA. Plan: - GDMT: currently toprol 25mg  today, entresto  24-26mg  bid, and spiro 12.5mg   -increase entresto  to 49-51mg  bid -Will hold off on SGLT2i due to prior UTIs. - continue to monitor home BP and HR, will attempt to increase metop in 2 weeks. And/or spiro to 25mg . -no lasix  needed - reviewed cmp from 2 weeks ago -compliant with salt and fluid restriction -Stressed importance of daily weights. Emphasized the need to avoid decompensated HF and hosp admissions. -if possible, should avoid any further cardiotoxic agents, per Dr. Irven -repeat TTE in 2 months  #CAD -- s/p PCI to RCA 11/13/2023. Previously experiencing atypical intermittent chest discomfort, now resolved. EKG shows ST and T wave abnormalities, seen on prior EKG. No residual lesions after RCA was stented.  - on eliquis 2.5mg  bid, no need for asa - continue plavix for min 6 months - continue atorvastatin 40mg  - No DM, HLD, HTN, tobacco hx. - encouraged patient to start cardiac rehab asap.   #elevated BP today, typically well controlled.  - continue uptitrating HF GDMT - goal BP <120/80  #history of DVT/PE  - provoked VTE in s/o hospitalization, discussed with oncologist, still considered hypercoagulable with MM in remission - continue oac indefinitely for now  #Multiple Myeloma -- on teclistamab  RTC in 2 months with me and 7 months with Dr. Irven Scheduled Future Appointments       Provider Department Dept Phone Center   12/29/2023 10:00 AM HEM ONC 03 LAB [facility] [place] - [place] Hematology Oncology [phone] [facility] Comp Can   12/29/2023 10:30 AM HEM ONC 03 MYELOMA APP 2 [facility] [place] - [place] Hematology Oncology [phone] [facility] Comp Can   12/29/2023 11:10 AM [facility]  INFUSION CCC POD C [facility] - [place] [address] Hematology Oncology Infusion [phone] [facility] Comp Can   01/12/2024 9:00 AM HEM ONC 03 LAB [facility] [place] - [place] Hematology Oncology [phone] [facility] Comp Can   01/12/2024  9:30 AM [facility] INFUSION CCC POD B [facility] - [place] [address] Hematology Oncology Infusion [phone] [facility] Comp Can   01/26/2024 9:30 AM HEM ONC 03 LAB [facility] - [place] [address] Hematology Oncology [phone] [facility] Comp Can   01/26/2024 10:00 AM HEM ONC 03 MYELOMA APP 2 [facility] [place] Hematology Oncology [phone] [facility] Comp Can   01/26/2024 10:30 AM [facility] INFUSION CCC POD F [facility] [place] - [place] Hematology Oncology Infusion [phone] [facility] Comp Can   07/11/2024 8:15 AM [place] Code [facility] - Cardiology [place] [phone] [facility] [place]

## 2023-12-28 DIAGNOSIS — R9431 Abnormal electrocardiogram [ECG] [EKG]: Secondary | ICD-10-CM | POA: Diagnosis not present

## 2023-12-28 DIAGNOSIS — I5020 Unspecified systolic (congestive) heart failure: Secondary | ICD-10-CM | POA: Diagnosis not present

## 2023-12-28 DIAGNOSIS — I2510 Atherosclerotic heart disease of native coronary artery without angina pectoris: Secondary | ICD-10-CM | POA: Diagnosis not present

## 2023-12-28 DIAGNOSIS — Z86711 Personal history of pulmonary embolism: Secondary | ICD-10-CM | POA: Diagnosis not present

## 2023-12-29 DIAGNOSIS — D801 Nonfamilial hypogammaglobulinemia: Secondary | ICD-10-CM | POA: Diagnosis not present

## 2023-12-29 DIAGNOSIS — D649 Anemia, unspecified: Secondary | ICD-10-CM | POA: Diagnosis not present

## 2023-12-29 DIAGNOSIS — D72819 Decreased white blood cell count, unspecified: Secondary | ICD-10-CM | POA: Diagnosis not present

## 2023-12-29 DIAGNOSIS — C9000 Multiple myeloma not having achieved remission: Secondary | ICD-10-CM | POA: Diagnosis not present

## 2023-12-29 DIAGNOSIS — Z5112 Encounter for antineoplastic immunotherapy: Secondary | ICD-10-CM | POA: Diagnosis not present

## 2023-12-29 DIAGNOSIS — M25552 Pain in left hip: Secondary | ICD-10-CM | POA: Diagnosis not present

## 2023-12-29 DIAGNOSIS — Z9484 Stem cells transplant status: Secondary | ICD-10-CM | POA: Diagnosis not present

## 2023-12-29 DIAGNOSIS — G620 Drug-induced polyneuropathy: Secondary | ICD-10-CM | POA: Diagnosis not present

## 2023-12-29 DIAGNOSIS — Z5111 Encounter for antineoplastic chemotherapy: Secondary | ICD-10-CM | POA: Diagnosis not present

## 2023-12-29 NOTE — Telephone Encounter (Signed)
 OV notes sent to [facility]  [facility]

## 2023-12-30 ENCOUNTER — Other Ambulatory Visit: Payer: Self-pay

## 2024-01-01 ENCOUNTER — Encounter: Payer: Self-pay | Admitting: Hematology

## 2024-01-02 ENCOUNTER — Other Ambulatory Visit: Payer: Self-pay

## 2024-01-02 ENCOUNTER — Encounter: Payer: Self-pay | Admitting: Hematology

## 2024-01-04 ENCOUNTER — Telehealth (HOSPITAL_COMMUNITY): Payer: Self-pay

## 2024-01-04 NOTE — Telephone Encounter (Signed)
 Attempted to schedule cardiac rehab- no answer, left message. Sent MyChart message.

## 2024-01-11 ENCOUNTER — Telehealth (HOSPITAL_COMMUNITY): Payer: Self-pay

## 2024-01-11 NOTE — Telephone Encounter (Signed)
 Patient called to get scheduled for Cardiac Rehab.  Advised that we would be calling to get him scheduled in January once the schedule is opened up.

## 2024-01-12 NOTE — Progress Notes (Signed)
 Patient without complaints.  Discharged alert and ambulatory.  Tolerated treatment without complaints.  Thedford Bohr RN

## 2024-01-18 ENCOUNTER — Ambulatory Visit
Admission: EM | Admit: 2024-01-18 | Discharge: 2024-01-18 | Disposition: A | Discharge status: Home / Self Care | Attending: Nurse Practitioner | Admitting: Nurse Practitioner

## 2024-01-18 ENCOUNTER — Encounter: Payer: Self-pay | Admitting: Emergency Medicine

## 2024-01-18 ENCOUNTER — Telehealth (HOSPITAL_COMMUNITY): Payer: Self-pay

## 2024-01-18 DIAGNOSIS — J069 Acute upper respiratory infection, unspecified: Secondary | ICD-10-CM

## 2024-01-18 LAB — POC COVID19/FLU A&B COMBO
Covid Antigen, POC: NEGATIVE
Influenza A Antigen, POC: NEGATIVE
Influenza B Antigen, POC: NEGATIVE

## 2024-01-18 MED ORDER — FLUTICASONE PROPIONATE 50 MCG/ACT NA SUSP: 2.0000 | Freq: Every day | NASAL | 0 refills | Status: AC

## 2024-01-18 NOTE — ED Provider Notes (Signed)
 [facility]    CSN: [id number] Arrival date & time: 01/18/24  1709      History   Chief Complaint Chief Complaint  Patient presents with   Cough    HPI Alexander Saunders is a 73 y.o. male.   Discussed the use of AI scribe software for clinical note transcription with the patient, who gave verbal consent to proceed.   The patient, with a history of cancer and currently on apixaban (Eliquis), presents with a three-day history of cough and nasal congestion. She reports a sensation of chest congestion but denies fever, chills, body aches, rhinorrhea, sore throat, sneezing, postnasal drainage, wheezing, shortness of breath, headaches, nausea, vomiting, or diarrhea. She denies known sick contacts. For symptom management, she has been taking NyQuil, which has helped reduce the cough. She has an upcoming cancer treatment scheduled for next week.  The following sections of the patient's history were reviewed and updated as appropriate: allergies, current medications, past family history, past medical history, past social history, past surgical history, and problem list.       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   Stem cells transplant status (HCC) 06/2017    Patient Active Problem List   Diagnosis Date Noted   Neutropenic fever 11/16/2022   Thrombocytopenia 11/16/2022   Pressure injury of skin 11/16/2022   Mucositis 11/16/2022   Pancytopenia (HCC) 11/16/2022   Erectile dysfunction 07/05/2021   Combined forms of age-related cataract, bilateral 07/05/2021   Encounter for fitting and  adjustment of hearing aid 07/05/2021   Essential (primary) hypertension 07/05/2021   Hearing loss 07/05/2021   Induratio penis plastica 07/05/2021   Lipoma 07/05/2021   Osteoarthritis of knee 07/05/2021   Peripheral neuropathy 07/05/2021   Seasonal allergic rhinitis 07/05/2021   Sensorineural hearing loss, bilateral 07/05/2021   Tinnitus, bilateral 07/05/2021   Hypertension 07/05/2021   Hyperlipidemia 06/07/2021   HFrEF (heart failure with reduced ejection fraction) (HCC) 04/29/2021   Nonrheumatic mitral valve regurgitation 04/29/2021   Palpitations 04/19/2021   Drug-induced polyneuropathy 01/05/2018   H/O autologous stem cell transplant (HCC) 06/25/2017   Bone pain due to G-CSF 05/02/2017   Fall 05/02/2017   Abnormal EKG 04/23/2017   Pre-transplant evaluation for stem cell transplant 03/31/2017   Multiple myeloma (HCC) 09/18/2016   Lower extremity weakness 09/03/2016   Acute kidney injury 09/03/2016   Urinary retention 09/03/2016   Leukocytosis 09/03/2016   Hypercalcemia 09/03/2016   Spinal cord lesion (HCC) 09/03/2016   Epidural mass 09/02/2016    Past Surgical History:  Procedure Laterality Date   APPENDECTOMY     BONE MARROW BIOPSY  07/2018   COLONOSCOPY     IR BONE MARROW BIOPSY & ASPIRATION  10/29/2022   KNEE ARTHROSCOPY     r/knee   LAMINECTOMY N/A 09/04/2016   Procedure: THORACIC FOUR-SIX LAMINECTOMY FOR RESECTION OF TUMOR;  Surgeon: Ditty, Morene Hicks, MD;  Location: [facility] OR;  Service: Neurosurgery;  Laterality: N/A;       Home Medications    Prior to Admission medications  Medication Sig Start  Date End Date Taking? Authorizing Provider  fluticasone  (FLONASE ) 50 MCG/ACT nasal spray Place 2 sprays into both nostrils daily. Shake well before use. Gently blow nose before spraying. Do not blow nose immediately after use. You should not taste the medication or feel it going down your throat; if you do, adjust your technique. 01/18/24  Yes Iola Lukes, FNP   acyclovir  (ZOVIRAX ) 400 MG tablet TAKE 1 TABLET(400 MG) BY MOUTH DAILY 04/24/23   Kale, Gautam Kishore, MD  apixaban (ELIQUIS) 5 MG TABS tablet Take 5 mg by mouth 2 (two) times daily. 12/09/22 01/08/23  [provider]  B Complex Vitamins (B-COMPLEX/B-12) TABS Take 1 tablet by mouth daily.    [provider]  bisacodyl  (DULCOLAX) 5 MG EC tablet Take 5 mg by mouth daily as needed for moderate constipation.    [provider]  carboxymethylcellulose (REFRESH PLUS) 0.5 % SOLN Apply 2 drops to eye daily as needed (dry eye). 01/23/22   [provider]  Cholecalciferol (VITAMIN D) 50 MCG (2000 UT) CAPS Take 2,000 Units by mouth daily.    [provider]  Coenzyme Q10 (COQ10 PO) Take 1 capsule by mouth daily.    [provider]  diphenhydramine -acetaminophen  (TYLENOL  PM EXTRA STRENGTH) 25-500 MG TABS tablet Take 1 tablet by mouth at bedtime as needed (pain).    [provider]  furosemide  (LASIX ) 20 MG tablet Lasix  20 mg daily as needed only for swelling or weight gain. Please take blood pressure prior to taking Lasix . If your systolic BP (top number) is less than 100, please don't take Lasix . 12/18/22   Goodrich, Callie E, PA-C  gabapentin  (NEURONTIN ) 300 MG capsule TAKE 3 CAPSULES BY MOUTH THREE TIMES DAILY IN THE MORNING AND IN THE AFTERNOON AND AT NIGHT Patient taking differently: Take 600 mg by mouth 2 (two) times daily. 04/22/22   Kale, Gautam Kishore, MD  Multiple Vitamin (MULTIVITAMIN) capsule Take 2 capsules by mouth daily.    [provider]  sodium chloride  (OCEAN) 0.65 % SOLN nasal spray Place 1 spray into both nostrils daily as needed for congestion.    [provider]  tamsulosin  (FLOMAX ) 0.4 MG CAPS capsule Take 0.4 mg by mouth daily. Patient not taking: Reported on 12/18/2022    [provider]    Family History Family History  Problem Relation Age of Onset   Hypertension Mother    Cancer Mother     Lung cancer Mother    Hypertension Father    Colon cancer Neg Hx    Colon polyps Neg Hx    Esophageal cancer Neg Hx    Rectal cancer Neg Hx    Stomach cancer Neg Hx     Social History Social History[1]   Allergies   Jardiance  [empagliflozin ]   Review of Systems Review of Systems  Constitutional:  Negative for chills and fever.  HENT:  Positive for congestion, postnasal drip and rhinorrhea. Negative for sneezing and sore throat.   Respiratory:  Positive for cough. Negative for shortness of breath and wheezing.   Gastrointestinal:  Positive for diarrhea. Negative for nausea and vomiting.  Musculoskeletal:  Negative for myalgias.  Neurological:  Positive for headaches.  All other systems reviewed and are negative.    Physical Exam Triage Vital Signs ED Triage Vitals  Encounter Vitals Group     BP 01/18/24 1813 128/77     Girls Systolic BP Percentile --      Girls Diastolic BP Percentile --  Boys Systolic BP Percentile --      Boys Diastolic BP Percentile --      Pulse Rate 01/18/24 1813 72     Resp 01/18/24 1813 17     Temp 01/18/24 1813 97.8 F (36.6 C)     Temp Source 01/18/24 1813 Oral     SpO2 01/18/24 1813 98 %     Weight --      Height --      Head Circumference --      Peak Flow --      Pain Score 01/18/24 1817 0     Pain Loc --      Pain Education --      Exclude from Growth Chart --    No data found.  Updated Vital Signs BP 128/77 (BP Location: Right Arm)   Pulse 72   Temp 97.8 F (36.6 C) (Oral)   Resp 17   SpO2 98%   Visual Acuity Right Eye Distance:   Left Eye Distance:   Bilateral Distance:    Right Eye Near:   Left Eye Near:    Bilateral Near:     Physical Exam Vitals reviewed.  Constitutional:      General: He is awake. He is not in acute distress.    Appearance: Normal appearance. He is well-developed. He is not ill-appearing, toxic-appearing or diaphoretic.  HENT:     Head: Normocephalic.     Right Ear: Tympanic  membrane, ear canal and external ear normal. No drainage, swelling or tenderness. No middle ear effusion. Tympanic membrane is not erythematous.     Left Ear: Tympanic membrane, ear canal and external ear normal. No drainage, swelling or tenderness.  No middle ear effusion. Tympanic membrane is not erythematous.     Nose: Congestion present.     Mouth/Throat:     Lips: Pink.     Mouth: Mucous membranes are moist.     Pharynx: No pharyngeal swelling, oropharyngeal exudate, posterior oropharyngeal erythema or uvula swelling.     Tonsils: No tonsillar exudate or tonsillar abscesses.  Eyes:     General: Vision grossly intact.     Conjunctiva/sclera: Conjunctivae normal.  Cardiovascular:     Rate and Rhythm: Normal rate.     Heart sounds: Normal heart sounds.  Pulmonary:     Effort: Pulmonary effort is normal. No tachypnea or respiratory distress.     Breath sounds: Normal breath sounds and air entry.     Comments: Respirations even and unlabored  Musculoskeletal:        General: Normal range of motion.     Cervical back: Normal range of motion and neck supple.  Lymphadenopathy:     Cervical: No cervical adenopathy.  Skin:    General: Skin is warm and dry.  Neurological:     General: No focal deficit present.     Mental Status: He is alert and oriented to person, place, and time.  Psychiatric:        Behavior: Behavior is cooperative.      UC Treatments / Results  Labs (all labs ordered are listed, but only abnormal results are displayed) Labs Reviewed  POC COVID19/FLU A&B COMBO    EKG   Radiology No results found.  Procedures Procedures (including critical care time)  Medications Ordered in UC Medications - No data to display  Initial Impression / Assessment and Plan / UC Course  I have reviewed the triage vital signs and the nursing notes.  Pertinent labs &  imaging results that were available during my care of the patient were reviewed by me and considered in my  medical decision making (see chart for details).     The patient presents with symptoms consistent with a viral upper respiratory infection. Flu & COVID testing negative. Exam is reassuring and no evidence of bacterial infection or acute cardiopulmonary process is noted. Flonase  prescribed.Supportive care is recommended. Patient was advised to follow up with primary care if symptoms do not improve within one week or if new concerns arise. Instructions were given to seek emergency care if symptoms worsen, including shortness of breath, chest pain, persistent high fever, inability to tolerate fluids, or confusion.  Today's evaluation has revealed no signs of a dangerous process. Discussed diagnosis with patient and/or guardian. Patient and/or guardian aware of their diagnosis, possible red flag symptoms to watch out for and need for close follow up. Patient and/or guardian understands verbal and written discharge instructions. Patient and/or guardian comfortable with plan and disposition.  Patient and/or guardian has a clear mental status at this time, good insight into illness (after discussion and teaching) and has clear judgment to make decisions regarding their care  Documentation was completed with the aid of voice recognition software. Transcription may contain typographical errors.   Final Clinical Impressions(s) / UC Diagnoses   Final diagnoses:  Viral upper respiratory tract infection     Discharge Instructions      Your COVID and flu tests were all negative today. Your symptoms are most likely due to a respiratory infection affecting your nose, throat, or lungs. These infections are usually caused by a virus, which means antibiotics will not help since they only treat bacterial infections. Please take the medications prescribed to you as directed. For fever, body aches, or throat pain, you may use acetaminophen  (Tylenol ) as needed. Saline nasal spray or saline rinses can also be used  freely to help clear mucus. Sore throat discomfort may improve with throat lozenges, warm saltwater gargles, or menthol /breathing strips. These treatments do not cure the illness but will help you feel more comfortable while your body heals. Staying hydrated is very important--drink plenty of fluids and aim for pale yellow urine. Using a cool mist humidifier, sitting in steam from a warm shower, and elevating your head when sleeping can also help with congestion and drainage. Be sure to replace your toothbrush once you start feeling better.  A cough can linger for several weeks after a respiratory infection even as other symptoms improve. This is normal as long as the cough is slowly getting better. However, if your symptoms worsen, you have trouble breathing, chest pain, new high fever, coughing up blood, or you are unable to keep fluids down, please go to the emergency room or seek medical care right away. Follow up with your primary care provider if your symptoms are not improving within one week.      ED Prescriptions     Medication Sig Dispense Auth. Provider   fluticasone  (FLONASE ) 50 MCG/ACT nasal spray Place 2 sprays into both nostrils daily. Shake well before use. Gently blow nose before spraying. Do not blow nose immediately after use. You should not taste the medication or feel it going down your throat; if you do, adjust your technique. 16 g Iola Lukes, FNP      PDMP not reviewed this encounter.     [1]  Social History Tobacco Use   Smoking status: Never   Smokeless tobacco: Never  Vaping Use   Vaping  status: Never Used  Substance Use Topics   Alcohol use: Yes    Comment: occ   Drug use: Never     Iola Lukes, FNP 01/18/24 1944

## 2024-01-18 NOTE — Telephone Encounter (Signed)
 Due to a cancellation, attempted to call patient regarding an opening for orientation in December for cardiac rehab- no answer, left message.

## 2024-01-18 NOTE — Discharge Instructions (Addendum)
 Your COVID and flu tests were all negative today. Your symptoms are most likely due to a respiratory infection affecting your nose, throat, or lungs. These infections are usually caused by a virus, which means antibiotics will not help since they only treat bacterial infections. Please take the medications prescribed to you as directed. For fever, body aches, or throat pain, you may use acetaminophen  (Tylenol ) as needed. Saline nasal spray or saline rinses can also be used freely to help clear mucus. Sore throat discomfort may improve with throat lozenges, warm saltwater gargles, or menthol /breathing strips. These treatments do not cure the illness but will help you feel more comfortable while your body heals. Staying hydrated is very important--drink plenty of fluids and aim for pale yellow urine. Using a cool mist humidifier, sitting in steam from a warm shower, and elevating your head when sleeping can also help with congestion and drainage. Be sure to replace your toothbrush once you start feeling better.  A cough can linger for several weeks after a respiratory infection even as other symptoms improve. This is normal as long as the cough is slowly getting better. However, if your symptoms worsen, you have trouble breathing, chest pain, new high fever, coughing up blood, or you are unable to keep fluids down, please go to the emergency room or seek medical care right away. Follow up with your primary care provider if your symptoms are not improving within one week.

## 2024-01-18 NOTE — ED Triage Notes (Signed)
Pt c/o cough and congestion for 3 days

## 2024-01-19 ENCOUNTER — Other Ambulatory Visit: Payer: Self-pay

## 2024-02-01 ENCOUNTER — Telehealth (HOSPITAL_COMMUNITY): Payer: Self-pay

## 2024-02-01 NOTE — Telephone Encounter (Signed)
 Called patient to see if he was interested in participating in the Cardiac Rehab Program. Patient will come in for orientation on 1/08 and will attend the 6:45 exercise class.  Sent MyChart message.  Patient is aware we are not able to confirm exact insurance benefit details until after 12/01, went over his estimated cost with [employer] B and [employer].

## 2024-02-02 ENCOUNTER — Other Ambulatory Visit: Payer: Self-pay

## 2024-02-03 ENCOUNTER — Telehealth (HOSPITAL_COMMUNITY): Payer: Self-pay

## 2024-02-03 NOTE — Telephone Encounter (Signed)
 Called pt to confirm his cardiac rehab appointment on 02/11/23. No answer. Left VM.

## 2024-02-09 NOTE — Progress Notes (Signed)
 Patient tolerated injection without complaint. Discharged alert, oriented, ambulatory.

## 2024-02-11 ENCOUNTER — Encounter (HOSPITAL_COMMUNITY): Payer: Self-pay

## 2024-02-11 ENCOUNTER — Encounter (HOSPITAL_COMMUNITY)
Admission: RE | Admit: 2024-02-11 | Discharge: 2024-02-11 | Disposition: A | Discharge status: Home / Self Care | Source: Ambulatory Visit | Attending: Cardiology | Admitting: Cardiology

## 2024-02-11 VITALS — BP 100/60 | HR 68 | Ht 72.0 in | Wt 196.9 lb

## 2024-02-11 DIAGNOSIS — Z48812 Encounter for surgical aftercare following surgery on the circulatory system: Secondary | ICD-10-CM | POA: Diagnosis present

## 2024-02-11 DIAGNOSIS — Z955 Presence of coronary angioplasty implant and graft: Secondary | ICD-10-CM | POA: Insufficient documentation

## 2024-02-11 NOTE — Progress Notes (Signed)
 Cardiac Rehab Medication Review by a Nurse   Does the patient  feel that his/her medications are working for him/her?  yes   Has the patient been experiencing any side effects to the medications prescribed?  no   Does the patient measure his/her own blood pressure or blood glucose at home?  yes    Does the patient have any problems obtaining medications due to transportation or finances?   no   Understanding of regimen: good Understanding of indications: good Potential of compliance: good     Nurse comments: Pt states, I do not have any questions about my prescribed medications at this time.

## 2024-02-11 NOTE — Progress Notes (Signed)
 Cardiac Individual Treatment Plan  Patient Details  Name: Alexander Saunders MRN: [id number] Date of Birth: 05/28/1950 Referring Provider:   Flowsheet Row INTENSIVE CARDIAC REHAB ORIENT from 02/11/2024 in [facility]  Referring Provider Dr. Irven (Dr. Wilbert Bihari covering)    Initial Encounter Date:  Flowsheet Row INTENSIVE CARDIAC REHAB ORIENT from 02/11/2024 in [facility]  Date 02/11/24    Visit Diagnosis: 11/09/23 DES to RCA  Patient's Home Medications on Admission: Current Medications[1]  Past Medical History: 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   Stem cells transplant status (HCC) 06/2017    Tobacco Use: Tobacco Use History[2]  Labs: Review Flowsheet        No data to display          Capillary Blood Glucose: Lab Results  Component Value Date   GLUCAP 132 (H) 10/24/2022   GLUCAP 96 12/26/2020   GLUCAP 125 (H) 10/08/2016     Exercise Target Goals: Exercise Program Goal: Individual exercise prescription set using results from initial 6 min walk test and THRR while considering  patients activity barriers and safety.   Exercise Prescription Goal: Initial exercise prescription builds to 30-45 minutes a day of aerobic activity, 2-3 days per week.  Home exercise guidelines will be given to patient during program as part of exercise prescription that the participant will acknowledge.  Activity Barriers & Risk Stratification:  Activity Barriers & Cardiac Risk  Stratification - 02/11/24 1110       Activity Barriers & Cardiac Risk Stratification   Activity Barriers Balance Concerns;Joint Problems;Back Problems;Neck/Spine Problems    Cardiac Risk Stratification High          6 Minute Walk:  6 Minute Walk     Row Name 02/11/24 1106         6 Minute Walk   Phase Initial     Distance 1505 feet     Walk Time 6 minutes     # of Rest Breaks 0     MPH 2.85     METS 3.29     RPE 8     Perceived Dyspnea  0     VO2 Peak 11.53     Symptoms Yes (comment)     Comments Chronic Left hip pain 1/10     Resting HR 68 bpm     Resting BP 100/60     Resting Oxygen Saturation  98 %     Exercise Oxygen Saturation  during 6 min walk 98 %     Max Ex. HR 110 bpm     Max Ex. BP 134/66     2 Minute Post BP 114/64        Oxygen Initial Assessment:   Oxygen Re-Evaluation:   Oxygen Discharge (Final Oxygen Re-Evaluation):   Initial Exercise Prescription:  Initial Exercise Prescription - 02/11/24 1100       Date of Initial Exercise RX and Referring Provider   Date 02/11/24  Referring Provider Dr. Irven (Dr. Wilbert Bihari covering)    Expected Discharge Date 05/04/24      Treadmill   MPH 2.3    Grade 0    Minutes 15    METs 2.76      Recumbant Elliptical   Level 2    RPM 60    Watts 80    Minutes 15    METs 3      Prescription Details   Frequency (times per week) 3    Duration Progress to 30 minutes of continuous aerobic without signs/symptoms of physical distress      Intensity   THRR 40-80% of Max Heartrate 58-117    Ratings of Perceived Exertion 11-13    Perceived Dyspnea 0-4      Progression   Progression Continue progressive overload as per policy without signs/symptoms or physical distress.      Resistance Training   Training Prescription Yes    Weight 10    Reps 10-15          Perform Capillary Blood Glucose checks as needed.  Exercise Prescription Changes:   Exercise Comments:   Exercise Goals and  Review:   Exercise Goals     Row Name 02/11/24 0831             Exercise Goals   Increase Physical Activity Yes       Intervention Provide advice, education, support and counseling about physical activity/exercise needs.;Develop an individualized exercise prescription for aerobic and resistive training based on initial evaluation findings, risk stratification, comorbidities and participant's personal goals.       Expected Outcomes Short Term: Attend rehab on a regular basis to increase amount of physical activity.;Long Term: Exercising regularly at least 3-5 days a week.;Long Term: Add in home exercise to make exercise part of routine and to increase amount of physical activity.       Able to understand and use rate of perceived exertion (RPE) scale Yes       Intervention Provide education and explanation on how to use RPE scale       Expected Outcomes Short Term: Able to use RPE daily in rehab to express subjective intensity level;Long Term:  Able to use RPE to guide intensity level when exercising independently       Knowledge and understanding of Target Heart Rate Range (THRR) Yes       Intervention Provide education and explanation of THRR including how the numbers were predicted and where they are located for reference       Expected Outcomes Long Term: Able to use THRR to govern intensity when exercising independently;Short Term: Able to state/look up THRR;Short Term: Able to use daily as guideline for intensity in rehab       Understanding of Exercise Prescription Yes       Intervention Provide education, explanation, and written materials on patient's individual exercise prescription       Expected Outcomes Short Term: Able to explain program exercise prescription;Long Term: Able to explain home exercise prescription to exercise independently          Exercise Goals Re-Evaluation :   Discharge Exercise Prescription (Final Exercise Prescription Changes):   Nutrition:  Target  Goals: Understanding of nutrition guidelines, daily intake of sodium 1500mg , cholesterol 200mg , calories 30% from fat and 7% or less from saturated fats, daily to have 5 or more servings of fruits and vegetables.  Biometrics:  Pre Biometrics - 02/11/24 1104  Pre Biometrics   Waist Circumference 34.5 inches    Hip Circumference 41 inches    Waist to Hip Ratio 0.84 %    Triceps Skinfold 16 mm    % Body Fat 24.6 %    Grip Strength 30 kg    Flexibility 9 in    Single Leg Stand 27 seconds           Nutrition Therapy Plan and Nutrition Goals:   Nutrition Assessments:  MEDIFICTS Score Key: >=70 Need to make dietary changes  40-70 Heart Healthy Diet <= 40 Therapeutic Level Cholesterol Diet    Picture Your Plate Scores: <59 Unhealthy dietary pattern with much room for improvement. 41-50 Dietary pattern unlikely to meet recommendations for good health and room for improvement. 51-60 More healthful dietary pattern, with some room for improvement.  >60 Healthy dietary pattern, although there may be some specific behaviors that could be improved.    Nutrition Goals Re-Evaluation:   Nutrition Goals Re-Evaluation:   Nutrition Goals Discharge (Final Nutrition Goals Re-Evaluation):   Psychosocial: Target Goals: Acknowledge presence or absence of significant depression and/or stress, maximize coping skills, provide positive support system. Participant is able to verbalize types and ability to use techniques and skills needed for reducing stress and depression.  Initial Review & Psychosocial Screening:  Initial Psych Review & Screening - 02/11/24 0846       Initial Review   Current issues with Current Sleep Concerns;Current Stress Concerns;Current Psychotropic Meds    Source of Stress Concerns Chronic Illness      Family Dynamics   Good Support System? Yes   Pt has his wife, daughter, and his church as a part of his support system     Barriers   Psychosocial  barriers to participate in program The patient should benefit from training in stress management and relaxation.      Screening Interventions   Interventions To provide support and resources with identified psychosocial needs;Provide feedback about the scores to participant;Encouraged to exercise    Expected Outcomes Long Term Goal: Stressors or current issues are controlled or eliminated.;Short Term goal: Identification and review with participant of any Quality of Life or Depression concerns found by scoring the questionnaire.;Long Term goal: The participant improves quality of Life and PHQ9 Scores as seen by post scores and/or verbalization of changes          Quality of Life Scores:  Quality of Life - 02/11/24 0930       Quality of Life   Select Quality of Life      Quality of Life Scores   Health/Function Pre 25.47 %    Socioeconomic Pre 27 %    Psych/Spiritual Pre 25.36 %    Family Pre 27.6 %    GLOBAL Pre 26.07 %         Scores of 19 and below usually indicate a poorer quality of life in these areas.  A difference of  2-3 points is a clinically meaningful difference.  A difference of 2-3 points in the total score of the Quality of Life Index has been associated with significant improvement in overall quality of life, self-image, physical symptoms, and general health in studies assessing change in quality of life.  PHQ-9: Review Flowsheet       02/11/2024  Depression screen PHQ 2/9  Decreased Interest 0  Down, Depressed, Hopeless 0  PHQ - 2 Score 0  Altered sleeping 2  Tired, decreased energy 0  Change in appetite 0  Feeling  bad or failure about yourself  0  Trouble concentrating 0  Moving slowly or fidgety/restless 0  Suicidal thoughts 0  PHQ-9 Score 2  Difficult doing work/chores Not difficult at all   Interpretation of Total Score  Total Score Depression Severity:  1-4 = Minimal depression, 5-9 = Mild depression, 10-14 = Moderate depression, 15-19 = Moderately  severe depression, 20-27 = Severe depression   Psychosocial Evaluation and Intervention:   Psychosocial Re-Evaluation:   Psychosocial Discharge (Final Psychosocial Re-Evaluation):   Vocational Rehabilitation: Provide vocational rehab assistance to qualifying candidates.   Vocational Rehab Evaluation & Intervention:  Vocational Rehab - 02/11/24 1127       Initial Vocational Rehab Evaluation & Intervention   Assessment shows need for Vocational Rehabilitation No   Retired         Education: Education Goals: Education classes will be provided on a weekly basis, covering required topics. Participant will state understanding/return demonstration of topics presented.     Core Videos: Exercise    Move It!  Clinical staff conducted group or individual video education with verbal and written material and guidebook.  Patient learns the recommended Pritikin exercise program. Exercise with the goal of living a long, healthy life. Some of the health benefits of exercise include controlled diabetes, healthier blood pressure levels, improved cholesterol levels, improved heart and lung capacity, improved sleep, and better body composition. Everyone should speak with their doctor before starting or changing an exercise routine.  Biomechanical Limitations Clinical staff conducted group or individual video education with verbal and written material and guidebook.  Patient learns how biomechanical limitations can impact exercise and how we can mitigate and possibly overcome limitations to have an impactful and balanced exercise routine.  Body Composition Clinical staff conducted group or individual video education with verbal and written material and guidebook.  Patient learns that body composition (ratio of muscle mass to fat mass) is a key component to assessing overall fitness, rather than body weight alone. Increased fat mass, especially visceral belly fat, can put us  at increased risk for  metabolic syndrome, type 2 diabetes, heart disease, and even death. It is recommended to combine diet and exercise (cardiovascular and resistance training) to improve your body composition. Seek guidance from your physician and exercise physiologist before implementing an exercise routine.  Exercise Action Plan Clinical staff conducted group or individual video education with verbal and written material and guidebook.  Patient learns the recommended strategies to achieve and enjoy long-term exercise adherence, including variety, self-motivation, self-efficacy, and positive decision making. Benefits of exercise include fitness, good health, weight management, more energy, better sleep, less stress, and overall well-being.  Medical   Heart Disease Risk Reduction Clinical staff conducted group or individual video education with verbal and written material and guidebook.  Patient learns our heart is our most vital organ as it circulates oxygen, nutrients, white blood cells, and hormones throughout the entire body, and carries waste away. Data supports a plant-based eating plan like the Pritikin Program for its effectiveness in slowing progression of and reversing heart disease. The video provides a number of recommendations to address heart disease.   Metabolic Syndrome and Belly Fat  Clinical staff conducted group or individual video education with verbal and written material and guidebook.  Patient learns what metabolic syndrome is, how it leads to heart disease, and how one can reverse it and keep it from coming back. You have metabolic syndrome if you have 3 of the following 5 criteria: abdominal obesity, high blood pressure,  high triglycerides, low HDL cholesterol, and high blood sugar.  Hypertension and Heart Disease Clinical staff conducted group or individual video education with verbal and written material and guidebook.  Patient learns that high blood pressure, or hypertension, is very common  in the [place] . Hypertension is largely due to excessive salt intake, but other important risk factors include being overweight, physical inactivity, drinking too much alcohol, smoking, and not eating enough potassium from fruits and vegetables. High blood pressure is a leading risk factor for heart attack, stroke, congestive heart failure, dementia, kidney failure, and premature death. Long-term effects of excessive salt intake include stiffening of the arteries and thickening of heart muscle and organ damage. Recommendations include ways to reduce hypertension and the risk of heart disease.  Diseases of Our Time - Focusing on Diabetes Clinical staff conducted group or individual video education with verbal and written material and guidebook.  Patient learns why the best way to stop diseases of our time is prevention, through food and other lifestyle changes. Medicine (such as prescription pills and surgeries) is often only a Band-Aid on the problem, not a long-term solution. Most common diseases of our time include obesity, type 2 diabetes, hypertension, heart disease, and cancer. The Pritikin Program is recommended and has been proven to help reduce, reverse, and/or prevent the damaging effects of metabolic syndrome.  Nutrition   Overview of the Pritikin Eating Plan  Clinical staff conducted group or individual video education with verbal and written material and guidebook.  Patient learns about the Pritikin Eating Plan for disease risk reduction. The Pritikin Eating Plan emphasizes a wide variety of unrefined, minimally-processed carbohydrates, like fruits, vegetables, whole grains, and legumes. Go, Caution, and Stop food choices are explained. Plant-based and lean animal proteins are emphasized. Rationale provided for low sodium intake for blood pressure control, low added sugars for blood sugar stabilization, and low added fats and oils for coronary artery disease risk reduction and weight  management.  Calorie Density  Clinical staff conducted group or individual video education with verbal and written material and guidebook.  Patient learns about calorie density and how it impacts the Pritikin Eating Plan. Knowing the characteristics of the food you choose will help you decide whether those foods will lead to weight gain or weight loss, and whether you want to consume more or less of them. Weight loss is usually a side effect of the Pritikin Eating Plan because of its focus on low calorie-dense foods.  Label Reading  Clinical staff conducted group or individual video education with verbal and written material and guidebook.  Patient learns about the Pritikin recommended label reading guidelines and corresponding recommendations regarding calorie density, added sugars, sodium content, and whole grains.  Dining Out - Part 1  Clinical staff conducted group or individual video education with verbal and written material and guidebook.  Patient learns that restaurant meals can be sabotaging because they can be so high in calories, fat, sodium, and/or sugar. Patient learns recommended strategies on how to positively address this and avoid unhealthy pitfalls.  Facts on Fats  Clinical staff conducted group or individual video education with verbal and written material and guidebook.  Patient learns that lifestyle modifications can be just as effective, if not more so, as many medications for lowering your risk of heart disease. A Pritikin lifestyle can help to reduce your risk of inflammation and atherosclerosis (cholesterol build-up, or plaque, in the artery walls). Lifestyle interventions such as dietary choices and physical activity address the cause  of atherosclerosis. A review of the types of fats and their impact on blood cholesterol levels, along with dietary recommendations to reduce fat intake is also included.  Nutrition Action Plan  Clinical staff conducted group or individual  video education with verbal and written material and guidebook.  Patient learns how to incorporate Pritikin recommendations into their lifestyle. Recommendations include planning and keeping personal health goals in mind as an important part of their success.  Healthy Mind-Set    Healthy Minds, Bodies, Hearts  Clinical staff conducted group or individual video education with verbal and written material and guidebook.  Patient learns how to identify when they are stressed. Video will discuss the impact of that stress, as well as the many benefits of stress management. Patient will also be introduced to stress management techniques. The way we think, act, and feel has an impact on our hearts.  How Our Thoughts Can Heal Our Hearts  Clinical staff conducted group or individual video education with verbal and written material and guidebook.  Patient learns that negative thoughts can cause depression and anxiety. This can result in negative lifestyle behavior and serious health problems. Cognitive behavioral therapy is an effective method to help control our thoughts in order to change and improve our emotional outlook.  Additional Videos:  Exercise    Improving Performance  Clinical staff conducted group or individual video education with verbal and written material and guidebook.  Patient learns to use a non-linear approach by alternating intensity levels and lengths of time spent exercising to help burn more calories and lose more body fat. Cardiovascular exercise helps improve heart health, metabolism, hormonal balance, blood sugar control, and recovery from fatigue. Resistance training improves strength, endurance, balance, coordination, reaction time, metabolism, and muscle mass. Flexibility exercise improves circulation, posture, and balance. Seek guidance from your physician and exercise physiologist before implementing an exercise routine and learn your capabilities and proper form for all  exercise.  Introduction to Yoga  Clinical staff conducted group or individual video education with verbal and written material and guidebook.  Patient learns about yoga, a discipline of the coming together of mind, breath, and body. The benefits of yoga include improved flexibility, improved range of motion, better posture and core strength, increased lung function, weight loss, and positive self-image. Yogas heart health benefits include lowered blood pressure, healthier heart rate, decreased cholesterol and triglyceride levels, improved immune function, and reduced stress. Seek guidance from your physician and exercise physiologist before implementing an exercise routine and learn your capabilities and proper form for all exercise.  Medical   Aging: Enhancing Your Quality of Life  Clinical staff conducted group or individual video education with verbal and written material and guidebook.  Patient learns key strategies and recommendations to stay in good physical health and enhance quality of life, such as prevention strategies, having an advocate, securing a Health Care Proxy and Power of Attorney, and keeping a list of medications and system for tracking them. It also discusses how to avoid risk for bone loss.  Biology of Weight Control  Clinical staff conducted group or individual video education with verbal and written material and guidebook.  Patient learns that weight gain occurs because we consume more calories than we burn (eating more, moving less). Even if your body weight is normal, you may have higher ratios of fat compared to muscle mass. Too much body fat puts you at increased risk for cardiovascular disease, heart attack, stroke, type 2 diabetes, and obesity-related cancers. In addition to  exercise, following the Pritikin Eating Plan can help reduce your risk.  Decoding Lab Results  Clinical staff conducted group or individual video education with verbal and written material and  guidebook.  Patient learns that lab test reflects one measurement whose values change over time and are influenced by many factors, including medication, stress, sleep, exercise, food, hydration, pre-existing medical conditions, and more. It is recommended to use the knowledge from this video to become more involved with your lab results and evaluate your numbers to speak with your doctor.   Diseases of Our Time - Overview  Clinical staff conducted group or individual video education with verbal and written material and guidebook.  Patient learns that according to the CDC, 50% to 70% of chronic diseases (such as obesity, type 2 diabetes, elevated lipids, hypertension, and heart disease) are avoidable through lifestyle improvements including healthier food choices, listening to satiety cues, and increased physical activity.  Sleep Disorders Clinical staff conducted group or individual video education with verbal and written material and guidebook.  Patient learns how good quality and duration of sleep are important to overall health and well-being. Patient also learns about sleep disorders and how they impact health along with recommendations to address them, including discussing with a physician.  Nutrition  Dining Out - Part 2 Clinical staff conducted group or individual video education with verbal and written material and guidebook.  Patient learns how to plan ahead and communicate in order to maximize their dining experience in a healthy and nutritious manner. Included are recommended food choices based on the type of restaurant the patient is visiting.   Fueling a Banker conducted group or individual video education with verbal and written material and guidebook.  There is a strong connection between our food choices and our health. Diseases like obesity and type 2 diabetes are very prevalent and are in large-part due to lifestyle choices. The Pritikin Eating Plan  provides plenty of food and hunger-curbing satisfaction. It is easy to follow, affordable, and helps reduce health risks.  Menu Workshop  Clinical staff conducted group or individual video education with verbal and written material and guidebook.  Patient learns that restaurant meals can sabotage health goals because they are often packed with calories, fat, sodium, and sugar. Recommendations include strategies to plan ahead and to communicate with the manager, chef, or server to help order a healthier meal.  Planning Your Eating Strategy  Clinical staff conducted group or individual video education with verbal and written material and guidebook.  Patient learns about the Pritikin Eating Plan and its benefit of reducing the risk of disease. The Pritikin Eating Plan does not focus on calories. Instead, it emphasizes high-quality, nutrient-rich foods. By knowing the characteristics of the foods, we choose, we can determine their calorie density and make informed decisions.  Targeting Your Nutrition Priorities  Clinical staff conducted group or individual video education with verbal and written material and guidebook.  Patient learns that lifestyle habits have a tremendous impact on disease risk and progression. This video provides eating and physical activity recommendations based on your personal health goals, such as reducing LDL cholesterol, losing weight, preventing or controlling type 2 diabetes, and reducing high blood pressure.  Vitamins and Minerals  Clinical staff conducted group or individual video education with verbal and written material and guidebook.  Patient learns different ways to obtain key vitamins and minerals, including through a recommended healthy diet. It is important to discuss all supplements you take with your  doctor.   Healthy Mind-Set    Smoking Cessation  Clinical staff conducted group or individual video education with verbal and written material and guidebook.   Patient learns that cigarette smoking and tobacco addiction pose a serious health risk which affects millions of people. Stopping smoking will significantly reduce the risk of heart disease, lung disease, and many forms of cancer. Recommended strategies for quitting are covered, including working with your doctor to develop a successful plan.  Culinary   Becoming a Set Designer conducted group or individual video education with verbal and written material and guidebook.  Patient learns that cooking at home can be healthy, cost-effective, quick, and puts them in control. Keys to cooking healthy recipes will include looking at your recipe, assessing your equipment needs, planning ahead, making it simple, choosing cost-effective seasonal ingredients, and limiting the use of added fats, salts, and sugars.  Cooking - Breakfast and Snacks  Clinical staff conducted group or individual video education with verbal and written material and guidebook.  Patient learns how important breakfast is to satiety and nutrition through the entire day. Recommendations include key foods to eat during breakfast to help stabilize blood sugar levels and to prevent overeating at meals later in the day. Planning ahead is also a key component.  Cooking - Educational Psychologist conducted group or individual video education with verbal and written material and guidebook.  Patient learns eating strategies to improve overall health, including an approach to cook more at home. Recommendations include thinking of animal protein as a side on your plate rather than center stage and focusing instead on lower calorie dense options like vegetables, fruits, whole grains, and plant-based proteins, such as beans. Making sauces in large quantities to freeze for later and leaving the skin on your vegetables are also recommended to maximize your experience.  Cooking - Healthy Salads and Dressing Clinical staff  conducted group or individual video education with verbal and written material and guidebook.  Patient learns that vegetables, fruits, whole grains, and legumes are the foundations of the Pritikin Eating Plan. Recommendations include how to incorporate each of these in flavorful and healthy salads, and how to create homemade salad dressings. Proper handling of ingredients is also covered. Cooking - Soups and [employer] - Soups and Desserts Clinical staff conducted group or individual video education with verbal and written material and guidebook.  Patient learns that Pritikin soups and desserts make for easy, nutritious, and delicious snacks and meal components that are low in sodium, fat, sugar, and calorie density, while high in vitamins, minerals, and filling fiber. Recommendations include simple and healthy ideas for soups and desserts.   Overview     The Pritikin Solution Program Overview Clinical staff conducted group or individual video education with verbal and written material and guidebook.  Patient learns that the results of the Pritikin Program have been documented in more than 100 articles published in peer-reviewed journals, and the benefits include reducing risk factors for (and, in some cases, even reversing) high cholesterol, high blood pressure, type 2 diabetes, obesity, and more! An overview of the three key pillars of the Pritikin Program will be covered: eating well, doing regular exercise, and having a healthy mind-set.  WORKSHOPS  Exercise: Exercise Basics: Building Your Action Plan Clinical staff led group instruction and group discussion with PowerPoint presentation and patient guidebook. To enhance the learning environment the use of posters, models and videos may be added. At the conclusion of  this workshop, patients will comprehend the difference between physical activity and exercise, as well as the benefits of incorporating both, into their routine. Patients will  understand the FITT (Frequency, Intensity, Time, and Type) principle and how to use it to build an exercise action plan. In addition, safety concerns and other considerations for exercise and cardiac rehab will be addressed by the presenter. The purpose of this lesson is to promote a comprehensive and effective weekly exercise routine in order to improve patients overall level of fitness.   Managing Heart Disease: Your Path to a Healthier Heart Clinical staff led group instruction and group discussion with PowerPoint presentation and patient guidebook. To enhance the learning environment the use of posters, models and videos may be added.At the conclusion of this workshop, patients will understand the anatomy and physiology of the heart. Additionally, they will understand how Pritikins three pillars impact the risk factors, the progression, and the management of heart disease.  The purpose of this lesson is to provide a high-level overview of the heart, heart disease, and how the Pritikin lifestyle positively impacts risk factors.  Exercise Biomechanics Clinical staff led group instruction and group discussion with PowerPoint presentation and patient guidebook. To enhance the learning environment the use of posters, models and videos may be added. Patients will learn how the structural parts of their bodies function and how these functions impact their daily activities, movement, and exercise. Patients will learn how to promote a neutral spine, learn how to manage pain, and identify ways to improve their physical movement in order to promote healthy living. The purpose of this lesson is to expose patients to common physical limitations that impact physical activity. Participants will learn practical ways to adapt and manage aches and pains, and to minimize their effect on regular exercise. Patients will learn how to maintain good posture while sitting, walking, and lifting.  Balance Training  and Fall Prevention  Clinical staff led group instruction and group discussion with PowerPoint presentation and patient guidebook. To enhance the learning environment the use of posters, models and videos may be added. At the conclusion of this workshop, patients will understand the importance of their sensorimotor skills (vision, proprioception, and the vestibular system) in maintaining their ability to balance as they age. Patients will apply a variety of balancing exercises that are appropriate for their current level of function. Patients will understand the common causes for poor balance, possible solutions to these problems, and ways to modify their physical environment in order to minimize their fall risk. The purpose of this lesson is to teach patients about the importance of maintaining balance as they age and ways to minimize their risk of falling.  WORKSHOPS   Nutrition:  Fueling a Ship Broker led group instruction and group discussion with PowerPoint presentation and patient guidebook. To enhance the learning environment the use of posters, models and videos may be added. Patients will review the foundational principles of the Pritikin Eating Plan and understand what constitutes a serving size in each of the food groups. Patients will also learn Pritikin-friendly foods that are better choices when away from home and review make-ahead meal and snack options. Calorie density will be reviewed and applied to three nutrition priorities: weight maintenance, weight loss, and weight gain. The purpose of this lesson is to reinforce (in a group setting) the key concepts around what patients are recommended to eat and how to apply these guidelines when away from home by planning and selecting Pritikin-friendly options. Patients  will understand how calorie density may be adjusted for different weight management goals.  Mindful Eating  Clinical staff led group instruction and group  discussion with PowerPoint presentation and patient guidebook. To enhance the learning environment the use of posters, models and videos may be added. Patients will briefly review the concepts of the Pritikin Eating Plan and the importance of low-calorie dense foods. The concept of mindful eating will be introduced as well as the importance of paying attention to internal hunger signals. Triggers for non-hunger eating and techniques for dealing with triggers will be explored. The purpose of this lesson is to provide patients with the opportunity to review the basic principles of the Pritikin Eating Plan, discuss the value of eating mindfully and how to measure internal cues of hunger and fullness using the Hunger Scale. Patients will also discuss reasons for non-hunger eating and learn strategies to use for controlling emotional eating.  Targeting Your Nutrition Priorities Clinical staff led group instruction and group discussion with PowerPoint presentation and patient guidebook. To enhance the learning environment the use of posters, models and videos may be added. Patients will learn how to determine their genetic susceptibility to disease by reviewing their family history. Patients will gain insight into the importance of diet as part of an overall healthy lifestyle in mitigating the impact of genetics and other environmental insults. The purpose of this lesson is to provide patients with the opportunity to assess their personal nutrition priorities by looking at their family history, their own health history and current risk factors. Patients will also be able to discuss ways of prioritizing and modifying the Pritikin Eating Plan for their highest risk areas  Menu  Clinical staff led group instruction and group discussion with PowerPoint presentation and patient guidebook. To enhance the learning environment the use of posters, models and videos may be added. Using menus brought in from [employer],  or printed from [employer], patients will apply the Pritikin dining out guidelines that were presented in the [employer] video. Patients will also be able to practice these guidelines in a variety of provided scenarios. The purpose of this lesson is to provide patients with the opportunity to practice hands-on learning of the Pritikin Dining Out guidelines with actual menus and practice scenarios.  Label Reading Clinical staff led group instruction and group discussion with PowerPoint presentation and patient guidebook. To enhance the learning environment the use of posters, models and videos may be added. Patients will review and discuss the Pritikin label reading guidelines presented in Pritikins Label Reading Educational series video. Using fool labels brought in from local grocery stores and markets, patients will apply the label reading guidelines and determine if the packaged food meet the Pritikin guidelines. The purpose of this lesson is to provide patients with the opportunity to review, discuss, and practice hands-on learning of the Pritikin Label Reading guidelines with actual packaged food labels. Cooking School  Pritikins [employer] are designed to teach patients ways to prepare quick, simple, and affordable recipes at home. The importance of nutritions role in chronic disease risk reduction is reflected in its emphasis in the overall Pritikin program. By learning how to prepare essential core Pritikin Eating Plan recipes, patients will increase control over what they eat; be able to customize the flavor of foods without the use of added salt, sugar, or fat; and improve the quality of the food they consume. By learning a set of core recipes which are easily assembled, quickly prepared,  and affordable, patients are more likely to prepare more healthy foods at home. These workshops focus on convenient breakfasts, simple entres, side dishes, and desserts which  can be prepared with minimal effort and are consistent with nutrition recommendations for cardiovascular risk reduction. Cooking [employer] are taught by a armed forces logistics/support/administrative officer (RD) who has been trained by the [employer]. The chef or RD has a clear understanding of the importance of minimizing - if not completely eliminating - added fat, sugar, and sodium in recipes. Throughout the series of Cooking School Workshop sessions, patients will learn about healthy ingredients and efficient methods of cooking to build confidence in their capability to prepare    Cooking School weekly topics:  Adding Flavor- Sodium-Free  Fast and Healthy Breakfasts  Powerhouse Plant-Based Proteins  Satisfying Salads and Dressings  Simple Sides and Sauces  International Cuisine-Spotlight on the [employer] Zones  Delicious Desserts  Savory Soups  [employer] - Meals in a Astronomer Appetizers and Snacks  Comforting Weekend Breakfasts  One-Pot Wonders   Fast Evening Meals  Landscape Architect Your Pritikin Plate  WORKSHOPS   Healthy Mindset (Psychosocial):  Focused Goals, Sustainable Changes Clinical staff led group instruction and group discussion with PowerPoint presentation and patient guidebook. To enhance the learning environment the use of posters, models and videos may be added. Patients will be able to apply effective goal setting strategies to establish at least one personal goal, and then take consistent, meaningful action toward that goal. They will learn to identify common barriers to achieving personal goals and develop strategies to overcome them. Patients will also gain an understanding of how our mind-set can impact our ability to achieve goals and the importance of cultivating a positive and growth-oriented mind-set. The purpose of this lesson is to provide patients with a deeper understanding of how to set and achieve personal goals, as well as the tools  and strategies needed to overcome common obstacles which may arise along the way.  From Head to Heart: The Power of a Healthy Outlook  Clinical staff led group instruction and group discussion with PowerPoint presentation and patient guidebook. To enhance the learning environment the use of posters, models and videos may be added. Patients will be able to recognize and describe the impact of emotions and mood on physical health. They will discover the importance of self-care and explore self-care practices which may work for them. Patients will also learn how to utilize the 4 Cs to cultivate a healthier outlook and better manage stress and challenges. The purpose of this lesson is to demonstrate to patients how a healthy outlook is an essential part of maintaining good health, especially as they continue their cardiac rehab journey.  Healthy Sleep for a Healthy Heart Clinical staff led group instruction and group discussion with PowerPoint presentation and patient guidebook. To enhance the learning environment the use of posters, models and videos may be added. At the conclusion of this workshop, patients will be able to demonstrate knowledge of the importance of sleep to overall health, well-being, and quality of life. They will understand the symptoms of, and treatments for, common sleep disorders. Patients will also be able to identify daytime and nighttime behaviors which impact sleep, and they will be able to apply these tools to help manage sleep-related challenges. The purpose of this lesson is to provide patients with a general overview of sleep and outline the importance of quality sleep. Patients will learn about a few  of the most common sleep disorders. Patients will also be introduced to the concept of sleep hygiene, and discover ways to self-manage certain sleeping problems through simple daily behavior changes. Finally, the workshop will motivate patients by clarifying the links between  quality sleep and their goals of heart-healthy living.   Recognizing and Reducing Stress Clinical staff led group instruction and group discussion with PowerPoint presentation and patient guidebook. To enhance the learning environment the use of posters, models and videos may be added. At the conclusion of this workshop, patients will be able to understand the types of stress reactions, differentiate between acute and chronic stress, and recognize the impact that chronic stress has on their health. They will also be able to apply different coping mechanisms, such as reframing negative self-talk. Patients will have the opportunity to practice a variety of stress management techniques, such as deep abdominal breathing, progressive muscle relaxation, and/or guided imagery.  The purpose of this lesson is to educate patients on the role of stress in their lives and to provide healthy techniques for coping with it.  Learning Barriers/Preferences:  Learning Barriers/Preferences - 02/11/24 1123       Learning Barriers/Preferences   Learning Barriers Hearing;Sight    Learning Preferences Group Instruction;Individual Instruction;Written Material          Education Topics:  Knowledge Questionnaire Score:  Knowledge Questionnaire Score - 02/11/24 0930       Knowledge Questionnaire Score   Pre Score 22/24          Core Components/Risk Factors/Patient Goals at Admission:  Personal Goals and Risk Factors at Admission - 02/11/24 0831       Core Components/Risk Factors/Patient Goals on Admission    Weight Management Yes;Weight Loss    Intervention Weight Management: Develop a combined nutrition and exercise program designed to reach desired caloric intake, while maintaining appropriate intake of nutrient and fiber, sodium and fats, and appropriate energy expenditure required for the weight goal.;Weight Management: Provide education and appropriate resources to help participant work on and attain  dietary goals.    Admit Weight 196 lb 13.9 oz (89.3 kg)    Heart Failure Yes    Intervention Provide a combined exercise and nutrition program that is supplemented with education, support and counseling about heart failure. Directed toward relieving symptoms such as shortness of breath, decreased exercise tolerance, and extremity edema.    Expected Outcomes Improve functional capacity of life;Short term: Attendance in program 2-3 days a week with increased exercise capacity. Reported lower sodium intake. Reported increased fruit and vegetable intake. Reports medication compliance.;Short term: Daily weights obtained and reported for increase. Utilizing diuretic protocols set by physician.;Long term: Adoption of self-care skills and reduction of barriers for early signs and symptoms recognition and intervention leading to self-care maintenance.    Hypertension Yes    Intervention Provide education on lifestyle modifcations including regular physical activity/exercise, weight management, moderate sodium restriction and increased consumption of fresh fruit, vegetables, and low fat dairy, alcohol moderation, and smoking cessation.;Monitor prescription use compliance.    Expected Outcomes Short Term: Continued assessment and intervention until BP is < 140/19mm HG in hypertensive participants. < 130/17mm HG in hypertensive participants with diabetes, heart failure or chronic kidney disease.;Long Term: Maintenance of blood pressure at goal levels.    Lipids Yes    Intervention Provide education and support for participant on nutrition & aerobic/resistive exercise along with prescribed medications to achieve LDL 70mg , HDL >40mg .    Expected Outcomes Short Term: Participant states understanding  of desired cholesterol values and is compliant with medications prescribed. Participant is following exercise prescription and nutrition guidelines.;Long Term: Cholesterol controlled with medications as prescribed, with  individualized exercise RX and with personalized nutrition plan. Value goals: LDL < 70mg , HDL > 40 mg.    Personal Goal Other Yes    Personal Goal LT lose belly fat, increase ROM and flexibility    Intervention Will continue to monitor pt and progress workloads as tolerated without sign or symptom    Expected Outcomes Pt will achieve his goals          Core Components/Risk Factors/Patient Goals Review:    Core Components/Risk Factors/Patient Goals at Discharge (Final Review):    ITP Comments:  ITP Comments     Row Name 02/11/24 0829           ITP Comments Dr. Wilbert Bihari medical director. Introduction to pritikin education/intensive cardiac rehab. Initial orientation packet reviewed with patient.          Comments: Participant attended orientation for the cardiac rehabilitation program on  02/11/2024  to perform initial intake and exercise walk test. Patient introduced to the Pritikin Program education and orientation packet was reviewed. Completed 6-minute walk test, measurements, initial ITP, and exercise prescription. Vital signs stable. Telemetry-normal sinus rhythm, asymptomatic.   Service time was from 0827 to 1043 .        [1]  Current Outpatient Medications:    acyclovir  (ZOVIRAX ) 400 MG tablet, TAKE 1 TABLET(400 MG) BY MOUTH DAILY, Disp: 90 tablet, Rfl: 3   B Complex Vitamins (B-COMPLEX/B-12) TABS, Take 1 tablet by mouth daily., Disp: , Rfl:    bisacodyl  (DULCOLAX) 5 MG EC tablet, Take 5 mg by mouth daily as needed for moderate constipation., Disp: , Rfl:    carboxymethylcellulose (REFRESH PLUS) 0.5 % SOLN, Apply 2 drops to eye daily as needed (dry eye)., Disp: , Rfl:    Coenzyme Q10 (COQ10 PO), Take 1 capsule by mouth daily., Disp: , Rfl:    diphenhydramine -acetaminophen  (TYLENOL  PM EXTRA STRENGTH) 25-500 MG TABS tablet, Take 1 tablet by mouth at bedtime as needed (pain)., Disp: , Rfl:    fluticasone  (FLONASE ) 50 MCG/ACT nasal spray, Place 2 sprays into both  nostrils daily. Shake well before use. Gently blow nose before spraying. Do not blow nose immediately after use. You should not taste the medication or feel it going down your throat; if you do, adjust your technique., Disp: 16 g, Rfl: 0   furosemide  (LASIX ) 20 MG tablet, Lasix  20 mg daily as needed only for swelling or weight gain. Please take blood pressure prior to taking Lasix . If your systolic BP (top number) is less than 100, please don't take Lasix ., Disp: 90 tablet, Rfl: 3   Multiple Vitamin (MULTIVITAMIN) capsule, Take 2 capsules by mouth daily., Disp: , Rfl:    sodium chloride  (OCEAN) 0.65 % SOLN nasal spray, Place 1 spray into both nostrils daily as needed for congestion., Disp: , Rfl:    traZODone  (DESYREL ) 150 MG tablet, Take 150 mg by mouth at bedtime., Disp: , Rfl:    apixaban (ELIQUIS) 5 MG TABS tablet, Take 5 mg by mouth 2 (two) times daily., Disp: , Rfl:    Cholecalciferol (VITAMIN D) 50 MCG (2000 UT) CAPS, Take 2,000 Units by mouth daily., Disp: , Rfl:    gabapentin  (NEURONTIN ) 300 MG capsule, TAKE 3 CAPSULES BY MOUTH THREE TIMES DAILY IN THE MORNING AND IN THE AFTERNOON AND AT NIGHT (Patient taking differently: Take 600 mg by  mouth 2 (two) times daily.), Disp: 270 capsule, Rfl: 3   tamsulosin  (FLOMAX ) 0.4 MG CAPS capsule, Take 0.4 mg by mouth daily. (Patient not taking: Reported on 12/18/2022), Disp: , Rfl:  [2]  Social History Tobacco Use  Smoking Status Never  Smokeless Tobacco Never

## 2024-02-15 ENCOUNTER — Encounter (HOSPITAL_COMMUNITY)
Admission: RE | Admit: 2024-02-15 | Discharge: 2024-02-15 | Disposition: A | Source: Ambulatory Visit | Attending: Cardiology

## 2024-02-15 DIAGNOSIS — Z48812 Encounter for surgical aftercare following surgery on the circulatory system: Secondary | ICD-10-CM | POA: Diagnosis not present

## 2024-02-15 DIAGNOSIS — Z955 Presence of coronary angioplasty implant and graft: Secondary | ICD-10-CM

## 2024-02-15 NOTE — Progress Notes (Signed)
 Daily Session Note  Patient Details  Name: Alexander Saunders MRN: [id number] Date of Birth: 10/01/1950 Referring Provider:   Flowsheet Row INTENSIVE CARDIAC REHAB ORIENT from 02/11/2024 in [facility]  Referring Provider Dr. Irven (Dr. Wilbert Bihari covering)    Encounter Date: 02/15/2024  Check In:  Session Check In - 02/15/24 0650       Check-In   Supervising physician immediately available to respond to emergencies [facility] MD immediately available    Physician(s) Rosaline Bane, NP    Staff Present Johnnie Moats, MS, ACSM-CEP, Exercise Physiologist;Jetta Vannie BS, ACSM-CEP, Exercise Physiologist;Olinty Valere, MS, ACSM-CEP, Exercise Physiologist;Mandy Fitzwater Violetta, RN, MHA    Virtual Visit No    Medication changes reported     No    Fall or balance concerns reported    No    Tobacco Cessation No Change    Warm-up and Cool-down Performed as group-led instruction    Resistance Training Performed Yes    VAD Patient? No    PAD/SET Patient? No      Pain Assessment   Currently in Pain? No/denies    Pain Score 0-No pain    Multiple Pain Sites No          Capillary Blood Glucose: No results found for this or any previous visit (from the past 24 hours).   Exercise Prescription Changes - 02/15/24 0924       Response to Exercise   Blood Pressure (Admit) 120/60    Blood Pressure (Exercise) 108/62    Blood Pressure (Exit) 110/62    Heart Rate (Admit) 65 bpm    Heart Rate (Exercise) 101 bpm    Heart Rate (Exit) 72 bpm    Rating of Perceived Exertion (Exercise) 11    Perceived Dyspnea (Exercise) 0    Symptoms none    Comments Pt first day in the [facility]CR program    Duration Progress to 30 minutes of  aerobic without signs/symptoms of physical distress    Intensity THRR unchanged      Progression   Progression Continue to progress workloads to maintain intensity without signs/symptoms of physical distress.     Average METs 2.69      Resistance Training   Weight 10    Reps 10-15    Time 10 Minutes      Treadmill   MPH 2.3   5 mins at 2.5/0 2.91MET's   Grade 0    Minutes 15    METs 2.76      Recumbant Elliptical   Level 2    RPM 48    Watts 57    Minutes 15    METs 2.4          Tobacco Use History[1]  Goals Met:  Exercise tolerated well No report of concerns or symptoms today Strength training completed today  Goals Unmet:  Not Applicable  Comments: Pt in today for cardiac rehab. Pt tolerated light exercise without difficulty. VSS, telemetry-Sinus Rhythm, asymptomatic. Medication list reconciled at orientation last week and patient reports no changes. Pt denies barriers to medication compliance.  PSYCHOSOCIAL ASSESSMENT:  PHQ-2. Pt exhibits positive coping skills, hopeful outlook with supportive family. No psychosocial needs identified at this time, no psychosocial interventions necessary  Pt oriented to exercise equipment and routine. Understanding verbalized.    Dr. Wilbert Bihari is Medical Director for Cardiac Rehab at [facility].    [1]  Social History Tobacco Use  Smoking Status  Never  Smokeless Tobacco Never

## 2024-02-17 ENCOUNTER — Encounter (HOSPITAL_COMMUNITY)
Admission: RE | Admit: 2024-02-17 | Discharge: 2024-02-17 | Disposition: A | Discharge status: Home / Self Care | Source: Ambulatory Visit | Attending: Cardiology | Admitting: Cardiology

## 2024-02-17 DIAGNOSIS — Z48812 Encounter for surgical aftercare following surgery on the circulatory system: Secondary | ICD-10-CM | POA: Diagnosis not present

## 2024-02-17 DIAGNOSIS — Z955 Presence of coronary angioplasty implant and graft: Secondary | ICD-10-CM

## 2024-02-19 ENCOUNTER — Encounter (HOSPITAL_COMMUNITY)
Admission: RE | Admit: 2024-02-19 | Discharge: 2024-02-19 | Disposition: A | Discharge status: Home / Self Care | Source: Ambulatory Visit | Attending: Cardiology | Admitting: Cardiology

## 2024-02-19 DIAGNOSIS — Z48812 Encounter for surgical aftercare following surgery on the circulatory system: Secondary | ICD-10-CM | POA: Diagnosis not present

## 2024-02-19 DIAGNOSIS — Z955 Presence of coronary angioplasty implant and graft: Secondary | ICD-10-CM

## 2024-02-22 ENCOUNTER — Encounter (HOSPITAL_COMMUNITY)
Admission: RE | Admit: 2024-02-22 | Discharge: 2024-02-22 | Disposition: A | Source: Ambulatory Visit | Attending: Cardiology

## 2024-02-22 DIAGNOSIS — Z955 Presence of coronary angioplasty implant and graft: Secondary | ICD-10-CM

## 2024-02-22 DIAGNOSIS — Z48812 Encounter for surgical aftercare following surgery on the circulatory system: Secondary | ICD-10-CM | POA: Diagnosis not present

## 2024-02-24 ENCOUNTER — Encounter (HOSPITAL_COMMUNITY)
Admission: RE | Admit: 2024-02-24 | Discharge: 2024-02-24 | Disposition: A | Source: Ambulatory Visit | Attending: Cardiology

## 2024-02-24 DIAGNOSIS — Z48812 Encounter for surgical aftercare following surgery on the circulatory system: Secondary | ICD-10-CM | POA: Diagnosis not present

## 2024-02-24 DIAGNOSIS — Z955 Presence of coronary angioplasty implant and graft: Secondary | ICD-10-CM

## 2024-02-26 ENCOUNTER — Encounter (HOSPITAL_COMMUNITY)
Admission: RE | Admit: 2024-02-26 | Discharge: 2024-02-26 | Disposition: A | Source: Ambulatory Visit | Attending: Cardiology

## 2024-02-26 DIAGNOSIS — Z48812 Encounter for surgical aftercare following surgery on the circulatory system: Secondary | ICD-10-CM | POA: Diagnosis not present

## 2024-02-26 DIAGNOSIS — Z955 Presence of coronary angioplasty implant and graft: Secondary | ICD-10-CM

## 2024-02-29 ENCOUNTER — Encounter (HOSPITAL_COMMUNITY)

## 2024-03-01 NOTE — Progress Notes (Signed)
 Cardiac Individual Treatment Plan  Patient Details  Name: Alexander Saunders MRN: [id number] Date of Birth: [birth year] Referring Provider:   Flowsheet Row INTENSIVE CARDIAC REHAB ORIENT from 02/11/2024 in [facility]  Referring Provider Dr. Irven (Dr. Wilbert Bihari covering)    Initial Encounter Date:  Flowsheet Row INTENSIVE CARDIAC REHAB ORIENT from 02/11/2024 in [facility]  Date 02/11/24    Visit Diagnosis: 11/09/23 DES to RCA  Patient's Home Medications on Admission: Current Medications[1]  Past Medical History: 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   Stem cells transplant status (HCC) 06/2017    Tobacco Use: Tobacco Use History[2]  Labs: Review Flowsheet        No data to display          Capillary Blood Glucose: Lab Results  Component Value Date   GLUCAP 132 (H) 10/24/2022   GLUCAP 96 12/26/2020   GLUCAP 125 (H) 10/08/2016     Exercise Target Goals: Exercise Program Goal: Individual exercise prescription set using results from initial 6 min walk test and THRR while considering  patients activity barriers and safety.   Exercise Prescription Goal: Initial exercise prescription builds to 30-45 minutes a day of aerobic activity, 2-3 days per week.  Home exercise guidelines will be given to patient during program as part of exercise prescription that the participant will acknowledge.  Activity Barriers & Risk Stratification:  Activity Barriers & Cardiac Risk  Stratification - 02/11/24 1110       Activity Barriers & Cardiac Risk Stratification   Activity Barriers Balance Concerns;Joint Problems;Back Problems;Neck/Spine Problems    Cardiac Risk Stratification High          6 Minute Walk:  6 Minute Walk     Row Name 02/11/24 1106         6 Minute Walk   Phase Initial     Distance 1505 feet     Walk Time 6 minutes     # of Rest Breaks 0     MPH 2.85     METS 3.29     RPE 8     Perceived Dyspnea  0     VO2 Peak 11.53     Symptoms Yes (comment)     Comments Chronic Left hip pain 1/10     Resting HR 68 bpm     Resting BP 100/60     Resting Oxygen Saturation  98 %     Exercise Oxygen Saturation  during 6 min walk 98 %     Max Ex. HR 110 bpm     Max Ex. BP 134/66     2 Minute Post BP 114/64        Oxygen Initial Assessment:   Oxygen Re-Evaluation:   Oxygen Discharge (Final Oxygen Re-Evaluation):   Initial Exercise Prescription:  Initial Exercise Prescription - 02/11/24 1100       Date of Initial Exercise RX and Referring Provider   Date 02/11/24  Referring Provider Dr. Irven (Dr. Wilbert Bihari covering)    Expected Discharge Date 05/04/24      Treadmill   MPH 2.3    Grade 0    Minutes 15    METs 2.76      Recumbant Elliptical   Level 2    RPM 60    Watts 80    Minutes 15    METs 3      Prescription Details   Frequency (times per week) 3    Duration Progress to 30 minutes of continuous aerobic without signs/symptoms of physical distress      Intensity   THRR 40-80% of Max Heartrate 58-117    Ratings of Perceived Exertion 11-13    Perceived Dyspnea 0-4      Progression   Progression Continue progressive overload as per policy without signs/symptoms or physical distress.      Resistance Training   Training Prescription Yes    Weight 10    Reps 10-15          Perform Capillary Blood Glucose checks as needed.  Exercise Prescription Changes:   Exercise Prescription Changes     Row Name  02/15/24 [phone]             Response to Exercise   Blood Pressure (Admit) 120/60       Blood Pressure (Exercise) 108/62       Blood Pressure (Exit) 110/62       Heart Rate (Admit) 65 bpm       Heart Rate (Exercise) 101 bpm       Heart Rate (Exit) 72 bpm       Rating of Perceived Exertion (Exercise) 11       Perceived Dyspnea (Exercise) 0       Symptoms none       Comments Pt first day in the pritikin ICR program       Duration Progress to 30 minutes of  aerobic without signs/symptoms of physical distress       Intensity THRR unchanged         Progression   Progression Continue to progress workloads to maintain intensity without signs/symptoms of physical distress.       Average METs 2.69         Resistance Training   Weight 10       Reps 10-15       Time 10 Minutes         Treadmill   MPH 2.3  5 mins at 2.5/0 2.91MET's       Grade 0       Minutes 15       METs 2.76         Recumbant Elliptical   Level 2       RPM 48       Watts 57       Minutes 15       METs 2.4          Exercise Comments:   Exercise Comments     Row Name 02/15/24 0927           Exercise Comments Pt first day in the Pritikin ICR program. Pt tolerated exercise well with an average MET level of 2.69. Pt is off to a good start and is learning his THRR, RPE and ExRx          Exercise Goals and Review:   Exercise Goals     Row Name 02/11/24 [phone]  Exercise Goals   Increase Physical Activity Yes       Intervention Provide advice, education, support and counseling about physical activity/exercise needs.;Develop an individualized exercise prescription for aerobic and resistive training based on initial evaluation findings, risk stratification, comorbidities and participant's personal goals.       Expected Outcomes Short Term: Attend rehab on a regular basis to increase amount of physical activity.;Long Term: Exercising regularly at least 3-5 days a week.;Long Term: Add in home  exercise to make exercise part of routine and to increase amount of physical activity.       Able to understand and use rate of perceived exertion (RPE) scale Yes       Intervention Provide education and explanation on how to use RPE scale       Expected Outcomes Short Term: Able to use RPE daily in rehab to express subjective intensity level;Long Term:  Able to use RPE to guide intensity level when exercising independently       Knowledge and understanding of Target Heart Rate Range (THRR) Yes       Intervention Provide education and explanation of THRR including how the numbers were predicted and where they are located for reference       Expected Outcomes Long Term: Able to use THRR to govern intensity when exercising independently;Short Term: Able to state/look up THRR;Short Term: Able to use daily as guideline for intensity in rehab       Understanding of Exercise Prescription Yes       Intervention Provide education, explanation, and written materials on patient's individual exercise prescription       Expected Outcomes Short Term: Able to explain program exercise prescription;Long Term: Able to explain home exercise prescription to exercise independently          Exercise Goals Re-Evaluation :  Exercise Goals Re-Evaluation     Row Name 02/15/24 0926             Exercise Goal Re-Evaluation   Exercise Goals Review Increase Physical Activity;Understanding of Exercise Prescription;Increase Strength and Stamina;Able to understand and use rate of perceived exertion (RPE) scale;Knowledge and understanding of Target Heart Rate Range (THRR)       Comments Pt first day in the Pritikin ICR program. Pt tolerated exercise well with an average MET level of 2.69. Pt is off to a good start and is learning his THRR, RPE and ExRx       Expected Outcomes Will continue to monitor pt and progress workloads as tolerated wiithout sign or symptom          Discharge Exercise Prescription (Final Exercise  Prescription Changes):  Exercise Prescription Changes - 02/15/24 0924       Response to Exercise   Blood Pressure (Admit) 120/60    Blood Pressure (Exercise) 108/62    Blood Pressure (Exit) 110/62    Heart Rate (Admit) 65 bpm    Heart Rate (Exercise) 101 bpm    Heart Rate (Exit) 72 bpm    Rating of Perceived Exertion (Exercise) 11    Perceived Dyspnea (Exercise) 0    Symptoms none    Comments Pt first day in the pritikin ICR program    Duration Progress to 30 minutes of  aerobic without signs/symptoms of physical distress    Intensity THRR unchanged      Progression   Progression Continue to progress workloads to maintain intensity without signs/symptoms of physical distress.    Average METs 2.69      Resistance  Training   Weight 10    Reps 10-15    Time 10 Minutes      Treadmill   MPH 2.3   5 mins at 2.5/0 2.91MET's   Grade 0    Minutes 15    METs 2.76      Recumbant Elliptical   Level 2    RPM 48    Watts 57    Minutes 15    METs 2.4          Nutrition:  Target Goals: Understanding of nutrition guidelines, daily intake of sodium 1500mg , cholesterol 200mg , calories 30% from fat and 7% or less from saturated fats, daily to have 5 or more servings of fruits and vegetables.  Biometrics:  Pre Biometrics - 02/11/24 1104       Pre Biometrics   Waist Circumference 34.5 inches    Hip Circumference 41 inches    Waist to Hip Ratio 0.84 %    Triceps Skinfold 16 mm    % Body Fat 24.6 %    Grip Strength 30 kg    Flexibility 9 in    Single Leg Stand 27 seconds           Nutrition Therapy Plan and Nutrition Goals:  Nutrition Therapy & Goals - 02/15/24 0957       Nutrition Therapy   Diet Heart Healthy, Low Sodium      Personal Nutrition Goals   Nutrition Goal Patient to identify strategies for reducing cardiovascular risk by attending the Pritikin education and nutrition series weekly.    Personal Goal #2 Patient to improve diet quality by using the plate  method as a guide for meal planning to include lean protein/plant protein, fruits, vegetables, whole grains, nonfat dairy as part of a well-balanced diet.    Personal Goal #3 Patient to identify strategies for weight loss with goal of 0.5-2 # per week of weight loss.    Comments Pt with medical history significant for multiple myeloma, CHF, CAD s/p DES to RCA. Verbalizesd desire to lose abdominal fat. Current BMI within appropriate range of 23-27 kg/m2 for >= 65 years. Pt endores limiting sodium intake and focusing on plant-based foods. However reports difficulty limiting sweets. RD provided tips to lower added sugar intake. Patient will benefit from participation in intensive cardiac rehab for nutrition education, exercise, and lifestyle modification.      Intervention Plan   Intervention Prescribe, educate and counsel regarding individualized specific dietary modifications aiming towards targeted core components such as weight, hypertension, lipid management, diabetes, heart failure and other comorbidities.    Expected Outcomes Short Term Goal: Understand basic principles of dietary content, such as calories, fat, sodium, cholesterol and nutrients.;Long Term Goal: Adherence to prescribed nutrition plan.          Nutrition Assessments:  MEDIFICTS Score Key: >=70 Need to make dietary changes  40-70 Heart Healthy Diet <= 40 Therapeutic Level Cholesterol Diet    Picture Your Plate Scores: <59 Unhealthy dietary pattern with much room for improvement. 41-50 Dietary pattern unlikely to meet recommendations for good health and room for improvement. 51-60 More healthful dietary pattern, with some room for improvement.  >60 Healthy dietary pattern, although there may be some specific behaviors that could be improved.    Nutrition Goals Re-Evaluation:   Nutrition Goals Re-Evaluation:   Nutrition Goals Discharge (Final Nutrition Goals Re-Evaluation):   Psychosocial: Target Goals:  Acknowledge presence or absence of significant depression and/or stress, maximize coping skills, provide positive support system. Participant  is able to verbalize types and ability to use techniques and skills needed for reducing stress and depression.  Initial Review & Psychosocial Screening:  Initial Psych Review & Screening - 02/11/24 0846       Initial Review   Current issues with Current Sleep Concerns;Current Stress Concerns;Current Psychotropic Meds    Source of Stress Concerns Chronic Illness      Family Dynamics   Good Support System? Yes   Pt has his wife, daughter, and his church as a part of his support system     Barriers   Psychosocial barriers to participate in program The patient should benefit from training in stress management and relaxation.      Screening Interventions   Interventions To provide support and resources with identified psychosocial needs;Provide feedback about the scores to participant;Encouraged to exercise    Expected Outcomes Long Term Goal: Stressors or current issues are controlled or eliminated.;Short Term goal: Identification and review with participant of any Quality of Life or Depression concerns found by scoring the questionnaire.;Long Term goal: The participant improves quality of Life and PHQ9 Scores as seen by post scores and/or verbalization of changes          Quality of Life Scores:  Quality of Life - 02/11/24 0930       Quality of Life   Select Quality of Life      Quality of Life Scores   Health/Function Pre 25.47 %    Socioeconomic Pre 27 %    Psych/Spiritual Pre 25.36 %    Family Pre 27.6 %    GLOBAL Pre 26.07 %         Scores of 19 and below usually indicate a poorer quality of life in these areas.  A difference of  2-3 points is a clinically meaningful difference.  A difference of 2-3 points in the total score of the Quality of Life Index has been associated with significant improvement in overall quality of life,  self-image, physical symptoms, and general health in studies assessing change in quality of life.  PHQ-9: Review Flowsheet       02/11/2024  Depression screen PHQ 2/9  Decreased Interest 0  Down, Depressed, Hopeless 0  PHQ - 2 Score 0  Altered sleeping 2  Tired, decreased energy 0  Change in appetite 0  Feeling bad or failure about yourself  0  Trouble concentrating 0  Moving slowly or fidgety/restless 0  Suicidal thoughts 0  PHQ-9 Score 2  Difficult doing work/chores Not difficult at all   Interpretation of Total Score  Total Score Depression Severity:  1-4 = Minimal depression, 5-9 = Mild depression, 10-14 = Moderate depression, 15-19 = Moderately severe depression, 20-27 = Severe depression   Psychosocial Evaluation and Intervention:   Psychosocial Re-Evaluation:  Psychosocial Re-Evaluation     Row Name 02/26/24 0905             Psychosocial Re-Evaluation   Current issues with Current Psychotropic Meds;Current Sleep Concerns;Current Stress Concerns       Comments Micaiah has not voiced any additional psychosocial concerns or stressors during exercise at cardiac rehab.       Expected Outcomes Mikie will voice decreased or controlled concerns or stressors by completion of cardiac rehab. (P)        Interventions Stress management education;Relaxation education;Encouraged to attend Cardiac Rehabilitation for the exercise       Continue Psychosocial Services  Follow up required by staff  Psychosocial Discharge (Final Psychosocial Re-Evaluation):  Psychosocial Re-Evaluation - 02/26/24 0905       Psychosocial Re-Evaluation   Current issues with Current Psychotropic Meds;Current Sleep Concerns;Current Stress Concerns    Comments Kristopher has not voiced any additional psychosocial concerns or stressors during exercise at cardiac rehab.    Expected Outcomes Dontre will voice decreased or controlled concerns or stressors by completion of cardiac rehab. (P)      Interventions Stress management education;Relaxation education;Encouraged to attend Cardiac Rehabilitation for the exercise    Continue Psychosocial Services  Follow up required by staff          Vocational Rehabilitation: Provide vocational rehab assistance to qualifying candidates.   Vocational Rehab Evaluation & Intervention:  Vocational Rehab - 02/11/24 1127       Initial Vocational Rehab Evaluation & Intervention   Assessment shows need for Vocational Rehabilitation No   Retired         Education: Education Goals: Education classes will be provided on a weekly basis, covering required topics. Participant will state understanding/return demonstration of topics presented.    Education     Row Name 02/15/24 0800     Education   Cardiac Education Topics Pritikin   Select Workshops     Workshops   Educator Dietitian   Select Nutrition   Nutrition Workshop Label Reading   Instruction Review Code 1- Verbalizes Understanding   Class Start Time [phone]   Class Stop Time 0903   Class Time Calculation (min) 47 min      Core Videos: Exercise    Move It!  Clinical staff conducted group or individual video education with verbal and written material and guidebook.  Patient learns the recommended Pritikin exercise program. Exercise with the goal of living a long, healthy life. Some of the health benefits of exercise include controlled diabetes, healthier blood pressure levels, improved cholesterol levels, improved heart and lung capacity, improved sleep, and better body composition. Everyone should speak with their doctor before starting or changing an exercise routine.  Biomechanical Limitations Clinical staff conducted group or individual video education with verbal and written material and guidebook.  Patient learns how biomechanical limitations can impact exercise and how we can mitigate and possibly overcome limitations to have an impactful and balanced exercise  routine.  Body Composition Clinical staff conducted group or individual video education with verbal and written material and guidebook.  Patient learns that body composition (ratio of muscle mass to fat mass) is a key component to assessing overall fitness, rather than body weight alone. Increased fat mass, especially visceral belly fat, can put us  at increased risk for metabolic syndrome, type 2 diabetes, heart disease, and even death. It is recommended to combine diet and exercise (cardiovascular and resistance training) to improve your body composition. Seek guidance from your physician and exercise physiologist before implementing an exercise routine.  Exercise Action Plan Clinical staff conducted group or individual video education with verbal and written material and guidebook.  Patient learns the recommended strategies to achieve and enjoy long-term exercise adherence, including variety, self-motivation, self-efficacy, and positive decision making. Benefits of exercise include fitness, good health, weight management, more energy, better sleep, less stress, and overall well-being.  Medical   Heart Disease Risk Reduction Clinical staff conducted group or individual video education with verbal and written material and guidebook.  Patient learns our heart is our most vital organ as it circulates oxygen, nutrients, white blood cells, and hormones throughout the entire body, and carries waste away. Data  supports a plant-based eating plan like the Pritikin Program for its effectiveness in slowing progression of and reversing heart disease. The video provides a number of recommendations to address heart disease.   Metabolic Syndrome and Belly Fat  Clinical staff conducted group or individual video education with verbal and written material and guidebook.  Patient learns what metabolic syndrome is, how it leads to heart disease, and how one can reverse it and keep it from coming back. You have  metabolic syndrome if you have 3 of the following 5 criteria: abdominal obesity, high blood pressure, high triglycerides, low HDL cholesterol, and high blood sugar.  Hypertension and Heart Disease Clinical staff conducted group or individual video education with verbal and written material and guidebook.  Patient learns that high blood pressure, or hypertension, is very common in the [place] . Hypertension is largely due to excessive salt intake, but other important risk factors include being overweight, physical inactivity, drinking too much alcohol, smoking, and not eating enough potassium from fruits and vegetables. High blood pressure is a leading risk factor for heart attack, stroke, congestive heart failure, dementia, kidney failure, and premature death. Long-term effects of excessive salt intake include stiffening of the arteries and thickening of heart muscle and organ damage. Recommendations include ways to reduce hypertension and the risk of heart disease.  Diseases of Our Time - Focusing on Diabetes Clinical staff conducted group or individual video education with verbal and written material and guidebook.  Patient learns why the best way to stop diseases of our time is prevention, through food and other lifestyle changes. Medicine (such as prescription pills and surgeries) is often only a Band-Aid on the problem, not a long-term solution. Most common diseases of our time include obesity, type 2 diabetes, hypertension, heart disease, and cancer. The Pritikin Program is recommended and has been proven to help reduce, reverse, and/or prevent the damaging effects of metabolic syndrome.  Nutrition   Overview of the Pritikin Eating Plan  Clinical staff conducted group or individual video education with verbal and written material and guidebook.  Patient learns about the Pritikin Eating Plan for disease risk reduction. The Pritikin Eating Plan emphasizes a wide variety of unrefined,  minimally-processed carbohydrates, like fruits, vegetables, whole grains, and legumes. Go, Caution, and Stop food choices are explained. Plant-based and lean animal proteins are emphasized. Rationale provided for low sodium intake for blood pressure control, low added sugars for blood sugar stabilization, and low added fats and oils for coronary artery disease risk reduction and weight management.  Calorie Density  Clinical staff conducted group or individual video education with verbal and written material and guidebook.  Patient learns about calorie density and how it impacts the Pritikin Eating Plan. Knowing the characteristics of the food you choose will help you decide whether those foods will lead to weight gain or weight loss, and whether you want to consume more or less of them. Weight loss is usually a side effect of the Pritikin Eating Plan because of its focus on low calorie-dense foods.  Label Reading  Clinical staff conducted group or individual video education with verbal and written material and guidebook.  Patient learns about the Pritikin recommended label reading guidelines and corresponding recommendations regarding calorie density, added sugars, sodium content, and whole grains.  Dining Out - Part 1  Clinical staff conducted group or individual video education with verbal and written material and guidebook.  Patient learns that restaurant meals can be sabotaging because they can be so high in  calories, fat, sodium, and/or sugar. Patient learns recommended strategies on how to positively address this and avoid unhealthy pitfalls.  Facts on Fats  Clinical staff conducted group or individual video education with verbal and written material and guidebook.  Patient learns that lifestyle modifications can be just as effective, if not more so, as many medications for lowering your risk of heart disease. A Pritikin lifestyle can help to reduce your risk of inflammation and atherosclerosis  (cholesterol build-up, or plaque, in the artery walls). Lifestyle interventions such as dietary choices and physical activity address the cause of atherosclerosis. A review of the types of fats and their impact on blood cholesterol levels, along with dietary recommendations to reduce fat intake is also included.  Nutrition Action Plan  Clinical staff conducted group or individual video education with verbal and written material and guidebook.  Patient learns how to incorporate Pritikin recommendations into their lifestyle. Recommendations include planning and keeping personal health goals in mind as an important part of their success.  Healthy Mind-Set    Healthy Minds, Bodies, Hearts  Clinical staff conducted group or individual video education with verbal and written material and guidebook.  Patient learns how to identify when they are stressed. Video will discuss the impact of that stress, as well as the many benefits of stress management. Patient will also be introduced to stress management techniques. The way we think, act, and feel has an impact on our hearts.  How Our Thoughts Can Heal Our Hearts  Clinical staff conducted group or individual video education with verbal and written material and guidebook.  Patient learns that negative thoughts can cause depression and anxiety. This can result in negative lifestyle behavior and serious health problems. Cognitive behavioral therapy is an effective method to help control our thoughts in order to change and improve our emotional outlook.  Additional Videos:  Exercise    Improving Performance  Clinical staff conducted group or individual video education with verbal and written material and guidebook.  Patient learns to use a non-linear approach by alternating intensity levels and lengths of time spent exercising to help burn more calories and lose more body fat. Cardiovascular exercise helps improve heart health, metabolism, hormonal balance,  blood sugar control, and recovery from fatigue. Resistance training improves strength, endurance, balance, coordination, reaction time, metabolism, and muscle mass. Flexibility exercise improves circulation, posture, and balance. Seek guidance from your physician and exercise physiologist before implementing an exercise routine and learn your capabilities and proper form for all exercise.  Introduction to Yoga  Clinical staff conducted group or individual video education with verbal and written material and guidebook.  Patient learns about yoga, a discipline of the coming together of mind, breath, and body. The benefits of yoga include improved flexibility, improved range of motion, better posture and core strength, increased lung function, weight loss, and positive self-image. Yogas heart health benefits include lowered blood pressure, healthier heart rate, decreased cholesterol and triglyceride levels, improved immune function, and reduced stress. Seek guidance from your physician and exercise physiologist before implementing an exercise routine and learn your capabilities and proper form for all exercise.  Medical   Aging: Enhancing Your Quality of Life  Clinical staff conducted group or individual video education with verbal and written material and guidebook.  Patient learns key strategies and recommendations to stay in good physical health and enhance quality of life, such as prevention strategies, having an advocate, securing a Health Care Proxy and Power of Attorney, and keeping a list of medications  and system for tracking them. It also discusses how to avoid risk for bone loss.  Biology of Weight Control  Clinical staff conducted group or individual video education with verbal and written material and guidebook.  Patient learns that weight gain occurs because we consume more calories than we burn (eating more, moving less). Even if your body weight is normal, you may have higher ratios of fat  compared to muscle mass. Too much body fat puts you at increased risk for cardiovascular disease, heart attack, stroke, type 2 diabetes, and obesity-related cancers. In addition to exercise, following the Pritikin Eating Plan can help reduce your risk.  Decoding Lab Results  Clinical staff conducted group or individual video education with verbal and written material and guidebook.  Patient learns that lab test reflects one measurement whose values change over time and are influenced by many factors, including medication, stress, sleep, exercise, food, hydration, pre-existing medical conditions, and more. It is recommended to use the knowledge from this video to become more involved with your lab results and evaluate your numbers to speak with your doctor.   Diseases of Our Time - Overview  Clinical staff conducted group or individual video education with verbal and written material and guidebook.  Patient learns that according to the CDC, 50% to 70% of chronic diseases (such as obesity, type 2 diabetes, elevated lipids, hypertension, and heart disease) are avoidable through lifestyle improvements including healthier food choices, listening to satiety cues, and increased physical activity.  Sleep Disorders Clinical staff conducted group or individual video education with verbal and written material and guidebook.  Patient learns how good quality and duration of sleep are important to overall health and well-being. Patient also learns about sleep disorders and how they impact health along with recommendations to address them, including discussing with a physician.  Nutrition  Dining Out - Part 2 Clinical staff conducted group or individual video education with verbal and written material and guidebook.  Patient learns how to plan ahead and communicate in order to maximize their dining experience in a healthy and nutritious manner. Included are recommended food choices based on the type of restaurant  the patient is visiting.   Fueling a Banker conducted group or individual video education with verbal and written material and guidebook.  There is a strong connection between our food choices and our health. Diseases like obesity and type 2 diabetes are very prevalent and are in large-part due to lifestyle choices. The Pritikin Eating Plan provides plenty of food and hunger-curbing satisfaction. It is easy to follow, affordable, and helps reduce health risks.  Menu Workshop  Clinical staff conducted group or individual video education with verbal and written material and guidebook.  Patient learns that restaurant meals can sabotage health goals because they are often packed with calories, fat, sodium, and sugar. Recommendations include strategies to plan ahead and to communicate with the manager, chef, or server to help order a healthier meal.  Planning Your Eating Strategy  Clinical staff conducted group or individual video education with verbal and written material and guidebook.  Patient learns about the Pritikin Eating Plan and its benefit of reducing the risk of disease. The Pritikin Eating Plan does not focus on calories. Instead, it emphasizes high-quality, nutrient-rich foods. By knowing the characteristics of the foods, we choose, we can determine their calorie density and make informed decisions.  Targeting Your Nutrition Priorities  Clinical staff conducted group or individual video education with verbal and written material  and guidebook.  Patient learns that lifestyle habits have a tremendous impact on disease risk and progression. This video provides eating and physical activity recommendations based on your personal health goals, such as reducing LDL cholesterol, losing weight, preventing or controlling type 2 diabetes, and reducing high blood pressure.  Vitamins and Minerals  Clinical staff conducted group or individual video education with verbal and written  material and guidebook.  Patient learns different ways to obtain key vitamins and minerals, including through a recommended healthy diet. It is important to discuss all supplements you take with your doctor.   Healthy Mind-Set    Smoking Cessation  Clinical staff conducted group or individual video education with verbal and written material and guidebook.  Patient learns that cigarette smoking and tobacco addiction pose a serious health risk which affects millions of people. Stopping smoking will significantly reduce the risk of heart disease, lung disease, and many forms of cancer. Recommended strategies for quitting are covered, including working with your doctor to develop a successful plan.  Culinary   Becoming a Set Designer conducted group or individual video education with verbal and written material and guidebook.  Patient learns that cooking at home can be healthy, cost-effective, quick, and puts them in control. Keys to cooking healthy recipes will include looking at your recipe, assessing your equipment needs, planning ahead, making it simple, choosing cost-effective seasonal ingredients, and limiting the use of added fats, salts, and sugars.  Cooking - Breakfast and Snacks  Clinical staff conducted group or individual video education with verbal and written material and guidebook.  Patient learns how important breakfast is to satiety and nutrition through the entire day. Recommendations include key foods to eat during breakfast to help stabilize blood sugar levels and to prevent overeating at meals later in the day. Planning ahead is also a key component.  Cooking - Educational Psychologist conducted group or individual video education with verbal and written material and guidebook.  Patient learns eating strategies to improve overall health, including an approach to cook more at home. Recommendations include thinking of animal protein as a side on your plate  rather than center stage and focusing instead on lower calorie dense options like vegetables, fruits, whole grains, and plant-based proteins, such as beans. Making sauces in large quantities to freeze for later and leaving the skin on your vegetables are also recommended to maximize your experience.  Cooking - Healthy Salads and Dressing Clinical staff conducted group or individual video education with verbal and written material and guidebook.  Patient learns that vegetables, fruits, whole grains, and legumes are the foundations of the Pritikin Eating Plan. Recommendations include how to incorporate each of these in flavorful and healthy salads, and how to create homemade salad dressings. Proper handling of ingredients is also covered. Cooking - Soups and [employer] - Soups and Desserts Clinical staff conducted group or individual video education with verbal and written material and guidebook.  Patient learns that Pritikin soups and desserts make for easy, nutritious, and delicious snacks and meal components that are low in sodium, fat, sugar, and calorie density, while high in vitamins, minerals, and filling fiber. Recommendations include simple and healthy ideas for soups and desserts.   Overview     The Pritikin Solution Program Overview Clinical staff conducted group or individual video education with verbal and written material and guidebook.  Patient learns that the results of the Pritikin Program have been documented in more than 100  articles published in peer-reviewed journals, and the benefits include reducing risk factors for (and, in some cases, even reversing) high cholesterol, high blood pressure, type 2 diabetes, obesity, and more! An overview of the three key pillars of the Pritikin Program will be covered: eating well, doing regular exercise, and having a healthy mind-set.  WORKSHOPS  Exercise: Exercise Basics: Building Your Action Plan Clinical staff led group instruction  and group discussion with PowerPoint presentation and patient guidebook. To enhance the learning environment the use of posters, models and videos may be added. At the conclusion of this workshop, patients will comprehend the difference between physical activity and exercise, as well as the benefits of incorporating both, into their routine. Patients will understand the FITT (Frequency, Intensity, Time, and Type) principle and how to use it to build an exercise action plan. In addition, safety concerns and other considerations for exercise and cardiac rehab will be addressed by the presenter. The purpose of this lesson is to promote a comprehensive and effective weekly exercise routine in order to improve patients overall level of fitness.   Managing Heart Disease: Your Path to a Healthier Heart Clinical staff led group instruction and group discussion with PowerPoint presentation and patient guidebook. To enhance the learning environment the use of posters, models and videos may be added.At the conclusion of this workshop, patients will understand the anatomy and physiology of the heart. Additionally, they will understand how Pritikins three pillars impact the risk factors, the progression, and the management of heart disease.  The purpose of this lesson is to provide a high-level overview of the heart, heart disease, and how the Pritikin lifestyle positively impacts risk factors.  Exercise Biomechanics Clinical staff led group instruction and group discussion with PowerPoint presentation and patient guidebook. To enhance the learning environment the use of posters, models and videos may be added. Patients will learn how the structural parts of their bodies function and how these functions impact their daily activities, movement, and exercise. Patients will learn how to promote a neutral spine, learn how to manage pain, and identify ways to improve their physical movement in order to  promote healthy living. The purpose of this lesson is to expose patients to common physical limitations that impact physical activity. Participants will learn practical ways to adapt and manage aches and pains, and to minimize their effect on regular exercise. Patients will learn how to maintain good posture while sitting, walking, and lifting.  Balance Training and Fall Prevention  Clinical staff led group instruction and group discussion with PowerPoint presentation and patient guidebook. To enhance the learning environment the use of posters, models and videos may be added. At the conclusion of this workshop, patients will understand the importance of their sensorimotor skills (vision, proprioception, and the vestibular system) in maintaining their ability to balance as they age. Patients will apply a variety of balancing exercises that are appropriate for their current level of function. Patients will understand the common causes for poor balance, possible solutions to these problems, and ways to modify their physical environment in order to minimize their fall risk. The purpose of this lesson is to teach patients about the importance of maintaining balance as they age and ways to minimize their risk of falling.  WORKSHOPS   Nutrition:  Fueling a Ship Broker led group instruction and group discussion with PowerPoint presentation and patient guidebook. To enhance the learning environment the use of posters, models and videos may be added. Patients will review the foundational  principles of the Pritikin Eating Plan and understand what constitutes a serving size in each of the food groups. Patients will also learn Pritikin-friendly foods that are better choices when away from home and review make-ahead meal and snack options. Calorie density will be reviewed and applied to three nutrition priorities: weight maintenance, weight loss, and weight gain. The purpose of this lesson is to  reinforce (in a group setting) the key concepts around what patients are recommended to eat and how to apply these guidelines when away from home by planning and selecting Pritikin-friendly options. Patients will understand how calorie density may be adjusted for different weight management goals.  Mindful Eating  Clinical staff led group instruction and group discussion with PowerPoint presentation and patient guidebook. To enhance the learning environment the use of posters, models and videos may be added. Patients will briefly review the concepts of the Pritikin Eating Plan and the importance of low-calorie dense foods. The concept of mindful eating will be introduced as well as the importance of paying attention to internal hunger signals. Triggers for non-hunger eating and techniques for dealing with triggers will be explored. The purpose of this lesson is to provide patients with the opportunity to review the basic principles of the Pritikin Eating Plan, discuss the value of eating mindfully and how to measure internal cues of hunger and fullness using the Hunger Scale. Patients will also discuss reasons for non-hunger eating and learn strategies to use for controlling emotional eating.  Targeting Your Nutrition Priorities Clinical staff led group instruction and group discussion with PowerPoint presentation and patient guidebook. To enhance the learning environment the use of posters, models and videos may be added. Patients will learn how to determine their genetic susceptibility to disease by reviewing their family history. Patients will gain insight into the importance of diet as part of an overall healthy lifestyle in mitigating the impact of genetics and other environmental insults. The purpose of this lesson is to provide patients with the opportunity to assess their personal nutrition priorities by looking at their family history, their own health history and current risk factors. Patients will  also be able to discuss ways of prioritizing and modifying the Pritikin Eating Plan for their highest risk areas  Menu  Clinical staff led group instruction and group discussion with PowerPoint presentation and patient guidebook. To enhance the learning environment the use of posters, models and videos may be added. Using menus brought in from [employer], or printed from [employer], patients will apply the Pritikin dining out guidelines that were presented in the [employer] video. Patients will also be able to practice these guidelines in a variety of provided scenarios. The purpose of this lesson is to provide patients with the opportunity to practice hands-on learning of the Pritikin Dining Out guidelines with actual menus and practice scenarios.  Label Reading Clinical staff led group instruction and group discussion with PowerPoint presentation and patient guidebook. To enhance the learning environment the use of posters, models and videos may be added. Patients will review and discuss the Pritikin label reading guidelines presented in Pritikins Label Reading Educational series video. Using fool labels brought in from local grocery stores and markets, patients will apply the label reading guidelines and determine if the packaged food meet the Pritikin guidelines. The purpose of this lesson is to provide patients with the opportunity to review, discuss, and practice hands-on learning of the Pritikin Label Reading guidelines with actual packaged food labels. Cooking School  Pritikins [employer] are designed to teach patients ways to prepare quick, simple, and affordable recipes at home. The importance of nutritions role in chronic disease risk reduction is reflected in its emphasis in the overall Pritikin program. By learning how to prepare essential core Pritikin Eating Plan recipes, patients will increase control over what they eat; be able to customize the  flavor of foods without the use of added salt, sugar, or fat; and improve the quality of the food they consume. By learning a set of core recipes which are easily assembled, quickly prepared, and affordable, patients are more likely to prepare more healthy foods at home. These workshops focus on convenient breakfasts, simple entres, side dishes, and desserts which can be prepared with minimal effort and are consistent with nutrition recommendations for cardiovascular risk reduction. Cooking [employer] are taught by a armed forces logistics/support/administrative officer (RD) who has been trained by the [employer]. The chef or RD has a clear understanding of the importance of minimizing - if not completely eliminating - added fat, sugar, and sodium in recipes. Throughout the series of Cooking School Workshop sessions, patients will learn about healthy ingredients and efficient methods of cooking to build confidence in their capability to prepare    Cooking School weekly topics:  Adding Flavor- Sodium-Free  Fast and Healthy Breakfasts  Powerhouse Plant-Based Proteins  Satisfying Salads and Dressings  Simple Sides and Sauces  International Cuisine-Spotlight on the [employer] Zones  Delicious Desserts  Savory Soups  [employer] - Meals in a Astronomer Appetizers and Snacks  Comforting Weekend Breakfasts  One-Pot Wonders   Fast Evening Meals  Landscape Architect Your Pritikin Plate  WORKSHOPS   Healthy Mindset (Psychosocial):  Focused Goals, Sustainable Changes Clinical staff led group instruction and group discussion with PowerPoint presentation and patient guidebook. To enhance the learning environment the use of posters, models and videos may be added. Patients will be able to apply effective goal setting strategies to establish at least one personal goal, and then take consistent, meaningful action toward that goal. They will learn to identify common barriers to achieving  personal goals and develop strategies to overcome them. Patients will also gain an understanding of how our mind-set can impact our ability to achieve goals and the importance of cultivating a positive and growth-oriented mind-set. The purpose of this lesson is to provide patients with a deeper understanding of how to set and achieve personal goals, as well as the tools and strategies needed to overcome common obstacles which may arise along the way.  From Head to Heart: The Power of a Healthy Outlook  Clinical staff led group instruction and group discussion with PowerPoint presentation and patient guidebook. To enhance the learning environment the use of posters, models and videos may be added. Patients will be able to recognize and describe the impact of emotions and mood on physical health. They will discover the importance of self-care and explore self-care practices which may work for them. Patients will also learn how to utilize the 4 Cs to cultivate a healthier outlook and better manage stress and challenges. The purpose of this lesson is to demonstrate to patients how a healthy outlook is an essential part of maintaining good health, especially as they continue their cardiac rehab journey.  Healthy Sleep for a Healthy Heart Clinical staff led group instruction and group discussion with PowerPoint presentation and patient guidebook. To enhance the learning environment the use of posters, models and videos  may be added. At the conclusion of this workshop, patients will be able to demonstrate knowledge of the importance of sleep to overall health, well-being, and quality of life. They will understand the symptoms of, and treatments for, common sleep disorders. Patients will also be able to identify daytime and nighttime behaviors which impact sleep, and they will be able to apply these tools to help manage sleep-related challenges. The purpose of this lesson is to provide patients with a general  overview of sleep and outline the importance of quality sleep. Patients will learn about a few of the most common sleep disorders. Patients will also be introduced to the concept of sleep hygiene, and discover ways to self-manage certain sleeping problems through simple daily behavior changes. Finally, the workshop will motivate patients by clarifying the links between quality sleep and their goals of heart-healthy living.   Recognizing and Reducing Stress Clinical staff led group instruction and group discussion with PowerPoint presentation and patient guidebook. To enhance the learning environment the use of posters, models and videos may be added. At the conclusion of this workshop, patients will be able to understand the types of stress reactions, differentiate between acute and chronic stress, and recognize the impact that chronic stress has on their health. They will also be able to apply different coping mechanisms, such as reframing negative self-talk. Patients will have the opportunity to practice a variety of stress management techniques, such as deep abdominal breathing, progressive muscle relaxation, and/or guided imagery.  The purpose of this lesson is to educate patients on the role of stress in their lives and to provide healthy techniques for coping with it.  Learning Barriers/Preferences:  Learning Barriers/Preferences - 02/11/24 1123       Learning Barriers/Preferences   Learning Barriers Hearing;Sight    Learning Preferences Group Instruction;Individual Instruction;Written Material          Education Topics:  Knowledge Questionnaire Score:  Knowledge Questionnaire Score - 02/11/24 0930       Knowledge Questionnaire Score   Pre Score 22/24          Core Components/Risk Factors/Patient Goals at Admission:  Personal Goals and Risk Factors at Admission - 02/11/24 0831       Core Components/Risk Factors/Patient Goals on Admission    Weight Management Yes;Weight Loss     Intervention Weight Management: Develop a combined nutrition and exercise program designed to reach desired caloric intake, while maintaining appropriate intake of nutrient and fiber, sodium and fats, and appropriate energy expenditure required for the weight goal.;Weight Management: Provide education and appropriate resources to help participant work on and attain dietary goals.    Admit Weight 196 lb 13.9 oz (89.3 kg)    Heart Failure Yes    Intervention Provide a combined exercise and nutrition program that is supplemented with education, support and counseling about heart failure. Directed toward relieving symptoms such as shortness of breath, decreased exercise tolerance, and extremity edema.    Expected Outcomes Improve functional capacity of life;Short term: Attendance in program 2-3 days a week with increased exercise capacity. Reported lower sodium intake. Reported increased fruit and vegetable intake. Reports medication compliance.;Short term: Daily weights obtained and reported for increase. Utilizing diuretic protocols set by physician.;Long term: Adoption of self-care skills and reduction of barriers for early signs and symptoms recognition and intervention leading to self-care maintenance.    Hypertension Yes    Intervention Provide education on lifestyle modifcations including regular physical activity/exercise, weight management, moderate sodium restriction and increased consumption of  fresh fruit, vegetables, and low fat dairy, alcohol moderation, and smoking cessation.;Monitor prescription use compliance.    Expected Outcomes Short Term: Continued assessment and intervention until BP is < 140/87mm HG in hypertensive participants. < 130/54mm HG in hypertensive participants with diabetes, heart failure or chronic kidney disease.;Long Term: Maintenance of blood pressure at goal levels.    Lipids Yes    Intervention Provide education and support for participant on nutrition &  aerobic/resistive exercise along with prescribed medications to achieve LDL 70mg , HDL >40mg .    Expected Outcomes Short Term: Participant states understanding of desired cholesterol values and is compliant with medications prescribed. Participant is following exercise prescription and nutrition guidelines.;Long Term: Cholesterol controlled with medications as prescribed, with individualized exercise RX and with personalized nutrition plan. Value goals: LDL < 70mg , HDL > 40 mg.    Personal Goal Other Yes    Personal Goal LT lose belly fat, increase ROM and flexibility    Intervention Will continue to monitor pt and progress workloads as tolerated without sign or symptom    Expected Outcomes Pt will achieve his goals          Core Components/Risk Factors/Patient Goals Review:   Goals and Risk Factor Review     Row Name 02/26/24 9047             Core Components/Risk Factors/Patient Goals Review   Personal Goals Review Heart Failure;Hypertension;Lipids;Stress;Weight Management/Obesity       Review Endrit is doing well with exercise at cardiac rehab since his 02/15/24 start. Vital signs have been stable, MET levels have increased.       Expected Outcomes Dutch will continue to particpate in cardiac rehab for exercise nutriton and lifestyle modifications.          Core Components/Risk Factors/Patient Goals at Discharge (Final Review):   Goals and Risk Factor Review - 02/26/24 0952       Core Components/Risk Factors/Patient Goals Review   Personal Goals Review Heart Failure;Hypertension;Lipids;Stress;Weight Management/Obesity    Review Godfrey is doing well with exercise at cardiac rehab since his 02/15/24 start. Vital signs have been stable, MET levels have increased.    Expected Outcomes Lenardo will continue to particpate in cardiac rehab for exercise nutriton and lifestyle modifications.          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 Introduction to [facility] education/intensive cardiac rehab. Initial orientation packet reviewed with patient. 30 Day ITP Review. Bronsen started cardiac rehab on 02/15/24 and is off to a good start with exercise. 30 Day ITP Review. Lerone is tolerating exercise well since he started cardiac rehab (02/15/24).        Comments: see ITP comments    [1]  Current Outpatient Medications:    acyclovir  (ZOVIRAX ) 400 MG tablet, TAKE 1 TABLET(400 MG) BY MOUTH DAILY, Disp: 90 tablet, Rfl: 3   apixaban (ELIQUIS) 5 MG TABS tablet, Take 5 mg by mouth 2 (two) times daily., Disp: , Rfl:    B Complex Vitamins (B-COMPLEX/B-12) TABS, Take 1 tablet by mouth daily., Disp: , Rfl:    bisacodyl  (DULCOLAX) 5 MG EC tablet, Take 5 mg by mouth daily as needed for moderate constipation., Disp: , Rfl:    carboxymethylcellulose (REFRESH PLUS) 0.5 % SOLN, Apply 2 drops to eye daily as needed (dry eye)., Disp: , Rfl:    Cholecalciferol (VITAMIN D) 50 MCG (  2000 UT) CAPS, Take 2,000 Units by mouth daily., Disp: , Rfl:    Coenzyme Q10 (COQ10 PO), Take 1 capsule by mouth daily., Disp: , Rfl:    diphenhydramine -acetaminophen  (TYLENOL  PM EXTRA STRENGTH) 25-500 MG TABS tablet, Take 1 tablet by mouth at bedtime as needed (pain)., Disp: , Rfl:    fluticasone  (FLONASE ) 50 MCG/ACT nasal spray, Place 2 sprays into both nostrils daily. Shake well before use. Gently blow nose before spraying. Do not blow nose immediately after use. You should not taste the medication or feel it going down your throat; if you do, adjust your technique., Disp: 16 g, Rfl: 0   furosemide  (LASIX ) 20 MG tablet, Lasix  20 mg daily as needed only for swelling or weight gain. Please take blood pressure prior to taking Lasix . If your systolic BP (top number) is less than 100, please don't take Lasix ., Disp: 90 tablet, Rfl: 3   gabapentin  (NEURONTIN ) 300 MG capsule, TAKE 3 CAPSULES BY MOUTH THREE TIMES DAILY IN THE  MORNING AND IN THE AFTERNOON AND AT NIGHT (Patient taking differently: Take 600 mg by mouth 2 (two) times daily.), Disp: 270 capsule, Rfl: 3   Multiple Vitamin (MULTIVITAMIN) capsule, Take 2 capsules by mouth daily., Disp: , Rfl:    sodium chloride  (OCEAN) 0.65 % SOLN nasal spray, Place 1 spray into both nostrils daily as needed for congestion., Disp: , Rfl:    tamsulosin  (FLOMAX ) 0.4 MG CAPS capsule, Take 0.4 mg by mouth daily. (Patient not taking: Reported on 12/18/2022), Disp: , Rfl:    traZODone  (DESYREL ) 150 MG tablet, Take 150 mg by mouth at bedtime., Disp: , Rfl:  [2]  Social History Tobacco Use  Smoking Status Never  Smokeless Tobacco Never

## 2024-03-02 ENCOUNTER — Encounter (HOSPITAL_COMMUNITY)
Admission: RE | Admit: 2024-03-02 | Discharge: 2024-03-02 | Disposition: A | Source: Ambulatory Visit | Attending: Cardiology

## 2024-03-02 ENCOUNTER — Encounter (HOSPITAL_COMMUNITY): Admission: RE | Admit: 2024-03-02 | Source: Ambulatory Visit

## 2024-03-02 DIAGNOSIS — Z955 Presence of coronary angioplasty implant and graft: Secondary | ICD-10-CM

## 2024-03-02 DIAGNOSIS — Z48812 Encounter for surgical aftercare following surgery on the circulatory system: Secondary | ICD-10-CM | POA: Diagnosis not present

## 2024-03-03 ENCOUNTER — Encounter: Payer: Self-pay | Admitting: Hematology

## 2024-03-04 ENCOUNTER — Encounter (HOSPITAL_COMMUNITY)
Admission: RE | Admit: 2024-03-04 | Discharge: 2024-03-04 | Disposition: A | Source: Ambulatory Visit | Attending: Cardiology

## 2024-03-04 DIAGNOSIS — Z48812 Encounter for surgical aftercare following surgery on the circulatory system: Secondary | ICD-10-CM | POA: Diagnosis not present

## 2024-03-04 DIAGNOSIS — Z955 Presence of coronary angioplasty implant and graft: Secondary | ICD-10-CM

## 2024-03-07 ENCOUNTER — Encounter: Payer: Self-pay | Admitting: Hematology

## 2024-03-07 ENCOUNTER — Encounter (HOSPITAL_COMMUNITY)

## 2024-03-09 ENCOUNTER — Encounter (HOSPITAL_COMMUNITY)
Admission: RE | Admit: 2024-03-09 | Discharge: 2024-03-09 | Disposition: A | Source: Ambulatory Visit | Attending: Cardiology

## 2024-03-09 DIAGNOSIS — Z955 Presence of coronary angioplasty implant and graft: Secondary | ICD-10-CM

## 2024-03-11 ENCOUNTER — Encounter (HOSPITAL_COMMUNITY): Admission: RE | Admit: 2024-03-11 | Source: Ambulatory Visit

## 2024-03-11 DIAGNOSIS — Z955 Presence of coronary angioplasty implant and graft: Secondary | ICD-10-CM

## 2024-03-14 ENCOUNTER — Encounter (HOSPITAL_COMMUNITY)

## 2024-03-16 ENCOUNTER — Encounter (HOSPITAL_COMMUNITY)

## 2024-03-18 ENCOUNTER — Encounter (HOSPITAL_COMMUNITY)

## 2024-03-21 ENCOUNTER — Encounter (HOSPITAL_COMMUNITY)

## 2024-03-23 ENCOUNTER — Encounter (HOSPITAL_COMMUNITY)

## 2024-03-25 ENCOUNTER — Encounter (HOSPITAL_COMMUNITY)

## 2024-03-28 ENCOUNTER — Encounter (HOSPITAL_COMMUNITY)

## 2024-03-30 ENCOUNTER — Encounter (HOSPITAL_COMMUNITY)

## 2024-04-01 ENCOUNTER — Encounter (HOSPITAL_COMMUNITY)

## 2024-04-04 ENCOUNTER — Encounter (HOSPITAL_COMMUNITY)

## 2024-04-06 ENCOUNTER — Encounter (HOSPITAL_COMMUNITY)

## 2024-04-08 ENCOUNTER — Encounter (HOSPITAL_COMMUNITY)

## 2024-04-11 ENCOUNTER — Encounter (HOSPITAL_COMMUNITY)

## 2024-04-13 ENCOUNTER — Encounter (HOSPITAL_COMMUNITY)

## 2024-04-15 ENCOUNTER — Encounter (HOSPITAL_COMMUNITY)

## 2024-04-18 ENCOUNTER — Encounter (HOSPITAL_COMMUNITY)

## 2024-04-20 ENCOUNTER — Encounter (HOSPITAL_COMMUNITY)

## 2024-04-22 ENCOUNTER — Encounter (HOSPITAL_COMMUNITY)

## 2024-04-25 ENCOUNTER — Encounter (HOSPITAL_COMMUNITY)

## 2024-04-27 ENCOUNTER — Encounter (HOSPITAL_COMMUNITY)

## 2024-04-29 ENCOUNTER — Encounter (HOSPITAL_COMMUNITY)

## 2024-05-02 ENCOUNTER — Encounter (HOSPITAL_COMMUNITY)

## 2024-05-04 ENCOUNTER — Encounter (HOSPITAL_COMMUNITY)
